# Patient Record
Sex: Male | Born: 1960 | Race: Black or African American | Hispanic: No | Marital: Married | Smoking: Former smoker
Health system: Southern US, Community
[De-identification: ages and names within clinical notes are randomized; demographics above are authoritative.]

## PROBLEM LIST (undated history)

## (undated) DIAGNOSIS — I219 Acute myocardial infarction, unspecified: Secondary | ICD-10-CM

## (undated) DIAGNOSIS — E119 Type 2 diabetes mellitus without complications: Secondary | ICD-10-CM

## (undated) DIAGNOSIS — J189 Pneumonia, unspecified organism: Secondary | ICD-10-CM

## (undated) DIAGNOSIS — K219 Gastro-esophageal reflux disease without esophagitis: Secondary | ICD-10-CM

## (undated) DIAGNOSIS — M1990 Unspecified osteoarthritis, unspecified site: Secondary | ICD-10-CM

## (undated) DIAGNOSIS — R51 Headache: Secondary | ICD-10-CM

## (undated) DIAGNOSIS — J449 Chronic obstructive pulmonary disease, unspecified: Secondary | ICD-10-CM

## (undated) HISTORY — PX: OTHER SURGICAL HISTORY: SHX169

## (undated) HISTORY — PX: TONSILLECTOMY: SUR1361

## (undated) HISTORY — DX: Type 2 diabetes mellitus without complications: E11.9

## (undated) HISTORY — PX: MULTIPLE TOOTH EXTRACTIONS: SHX2053

## (undated) HISTORY — PX: SPINE SURGERY: SHX786

---

## 2008-03-08 ENCOUNTER — Emergency Department (HOSPITAL_COMMUNITY): Admission: EM | Admit: 2008-03-08 | Discharge: 2008-03-08 | Payer: Self-pay | Admitting: Family Medicine

## 2008-05-13 ENCOUNTER — Emergency Department (HOSPITAL_COMMUNITY): Admission: EM | Admit: 2008-05-13 | Discharge: 2008-05-13 | Payer: Self-pay | Admitting: Emergency Medicine

## 2008-07-20 ENCOUNTER — Emergency Department (HOSPITAL_COMMUNITY): Admission: EM | Admit: 2008-07-20 | Discharge: 2008-07-20 | Payer: Self-pay | Admitting: Emergency Medicine

## 2008-09-16 ENCOUNTER — Emergency Department (HOSPITAL_COMMUNITY): Admission: EM | Admit: 2008-09-16 | Discharge: 2008-09-16 | Payer: Self-pay | Admitting: Emergency Medicine

## 2008-10-11 ENCOUNTER — Emergency Department (HOSPITAL_COMMUNITY): Admission: EM | Admit: 2008-10-11 | Discharge: 2008-10-12 | Payer: Self-pay | Admitting: Emergency Medicine

## 2008-12-01 ENCOUNTER — Emergency Department (HOSPITAL_COMMUNITY): Admission: EM | Admit: 2008-12-01 | Discharge: 2008-12-01 | Payer: Self-pay | Admitting: Emergency Medicine

## 2008-12-24 ENCOUNTER — Emergency Department (HOSPITAL_COMMUNITY): Admission: EM | Admit: 2008-12-24 | Discharge: 2008-12-24 | Payer: Self-pay | Admitting: Family Medicine

## 2009-01-23 ENCOUNTER — Emergency Department (HOSPITAL_COMMUNITY): Admission: EM | Admit: 2009-01-23 | Discharge: 2009-01-23 | Payer: Self-pay | Admitting: Emergency Medicine

## 2009-03-07 ENCOUNTER — Emergency Department (HOSPITAL_COMMUNITY): Admission: EM | Admit: 2009-03-07 | Discharge: 2009-03-07 | Payer: Self-pay | Admitting: Emergency Medicine

## 2009-04-09 ENCOUNTER — Emergency Department (HOSPITAL_COMMUNITY): Admission: EM | Admit: 2009-04-09 | Discharge: 2009-04-09 | Payer: Self-pay | Admitting: Emergency Medicine

## 2009-05-23 ENCOUNTER — Emergency Department (HOSPITAL_COMMUNITY): Admission: EM | Admit: 2009-05-23 | Discharge: 2009-05-23 | Payer: Self-pay | Admitting: Emergency Medicine

## 2009-06-08 ENCOUNTER — Ambulatory Visit: Payer: Self-pay | Admitting: Internal Medicine

## 2009-07-06 ENCOUNTER — Ambulatory Visit: Payer: Self-pay | Admitting: Internal Medicine

## 2009-07-14 ENCOUNTER — Ambulatory Visit (HOSPITAL_COMMUNITY): Admission: RE | Admit: 2009-07-14 | Discharge: 2009-07-14 | Payer: Self-pay | Admitting: Family Medicine

## 2009-08-17 ENCOUNTER — Ambulatory Visit: Payer: Self-pay | Admitting: Internal Medicine

## 2009-10-13 LAB — PULMONARY FUNCTION TEST

## 2010-05-09 ENCOUNTER — Emergency Department (HOSPITAL_COMMUNITY)
Admission: EM | Admit: 2010-05-09 | Discharge: 2010-05-09 | Disposition: A | Discharge status: Home / Self Care | Payer: Medicaid Other | Attending: Emergency Medicine | Admitting: Emergency Medicine

## 2010-05-09 DIAGNOSIS — M545 Low back pain, unspecified: Secondary | ICD-10-CM | POA: Insufficient documentation

## 2010-05-09 DIAGNOSIS — J449 Chronic obstructive pulmonary disease, unspecified: Secondary | ICD-10-CM | POA: Insufficient documentation

## 2010-05-09 DIAGNOSIS — J4489 Other specified chronic obstructive pulmonary disease: Secondary | ICD-10-CM | POA: Insufficient documentation

## 2010-05-10 LAB — GC/CHLAMYDIA PROBE AMP, GENITAL
Chlamydia, DNA Probe: NEGATIVE
GC Probe Amp, Genital: NEGATIVE

## 2010-06-23 ENCOUNTER — Ambulatory Visit (HOSPITAL_COMMUNITY)
Admission: RE | Admit: 2010-06-23 | Discharge: 2010-06-23 | Disposition: A | Discharge status: Home / Self Care | Payer: Medicaid Other | Source: Ambulatory Visit | Attending: Family Medicine | Admitting: Family Medicine

## 2010-06-23 ENCOUNTER — Other Ambulatory Visit (HOSPITAL_COMMUNITY): Payer: Self-pay | Admitting: Family Medicine

## 2010-06-23 DIAGNOSIS — M545 Low back pain, unspecified: Secondary | ICD-10-CM | POA: Insufficient documentation

## 2010-06-23 DIAGNOSIS — R52 Pain, unspecified: Secondary | ICD-10-CM

## 2010-06-23 DIAGNOSIS — Z9181 History of falling: Secondary | ICD-10-CM | POA: Insufficient documentation

## 2010-06-23 DIAGNOSIS — M51379 Other intervertebral disc degeneration, lumbosacral region without mention of lumbar back pain or lower extremity pain: Secondary | ICD-10-CM | POA: Insufficient documentation

## 2010-06-23 DIAGNOSIS — M5137 Other intervertebral disc degeneration, lumbosacral region: Secondary | ICD-10-CM | POA: Insufficient documentation

## 2010-06-23 DIAGNOSIS — M47814 Spondylosis without myelopathy or radiculopathy, thoracic region: Secondary | ICD-10-CM | POA: Insufficient documentation

## 2010-06-30 ENCOUNTER — Emergency Department (HOSPITAL_COMMUNITY)
Admission: EM | Admit: 2010-06-30 | Discharge: 2010-06-30 | Disposition: A | Discharge status: Home / Self Care | Payer: Medicaid Other | Attending: Emergency Medicine | Admitting: Emergency Medicine

## 2010-06-30 DIAGNOSIS — M549 Dorsalgia, unspecified: Secondary | ICD-10-CM | POA: Insufficient documentation

## 2010-06-30 DIAGNOSIS — J4489 Other specified chronic obstructive pulmonary disease: Secondary | ICD-10-CM | POA: Insufficient documentation

## 2010-06-30 DIAGNOSIS — J449 Chronic obstructive pulmonary disease, unspecified: Secondary | ICD-10-CM | POA: Insufficient documentation

## 2010-06-30 DIAGNOSIS — M545 Low back pain, unspecified: Secondary | ICD-10-CM | POA: Insufficient documentation

## 2010-06-30 DIAGNOSIS — G8929 Other chronic pain: Secondary | ICD-10-CM | POA: Insufficient documentation

## 2010-06-30 DIAGNOSIS — R062 Wheezing: Secondary | ICD-10-CM | POA: Insufficient documentation

## 2010-07-22 ENCOUNTER — Encounter: Payer: Medicaid Other | Attending: Neurosurgery | Admitting: Neurosurgery

## 2010-07-22 DIAGNOSIS — M5430 Sciatica, unspecified side: Secondary | ICD-10-CM

## 2010-07-22 DIAGNOSIS — M545 Low back pain, unspecified: Secondary | ICD-10-CM | POA: Insufficient documentation

## 2010-07-22 DIAGNOSIS — J449 Chronic obstructive pulmonary disease, unspecified: Secondary | ICD-10-CM | POA: Insufficient documentation

## 2010-07-22 DIAGNOSIS — R269 Unspecified abnormalities of gait and mobility: Secondary | ICD-10-CM | POA: Insufficient documentation

## 2010-07-22 DIAGNOSIS — J4489 Other specified chronic obstructive pulmonary disease: Secondary | ICD-10-CM | POA: Insufficient documentation

## 2010-07-23 NOTE — Progress Notes (Signed)
This is a patient sent to [place] by [facility] for chronic low back pain. He states he has had a fall in the past that has been over 4 years ago. He was treated in [place].  He moved down here 2 years ago and been treated by Dr. Audria Nine, now Dr. Suzie Portela at [facility].  He states he has been taking Percocet and that is only thing that helps.  He does have a recent prescription for Percocet from a dentist that has done some extractions for him.  He states his pain is worse.  He does not describe radicular pain, just low back pain.  The patient is extremely short of breath, his COPD is advanced.  He is on multiple medications for that.  He rates his pain at a 10, his general activity level is 10.  Pain is worse during the day, all activities aggravate it, medication is the only thing that helps. The patient is asking for Percocet by name.  Mobility, he walks without assistance.  He states he does have a cane and walker, although I do not see that today.  He does walk with a kyphotic limp.  He does not climb steps, he drives, he can only walk about 10 minutes.  He is unemployed.  REVIEW OF SYSTEMS:  As noted, difficulties above as well as night sweats, the shortness of breath, and wheezing, numbness, paresthesias, depression, anxiety.  No suicidal thoughts are noted.  PRIMARY CARE DOCTOR:  Dr. Suzie Portela, [facility].  PAST MEDICAL HISTORY:  Significant for the COPD.  SOCIAL HISTORY:  He is single, lives with a friend.  FAMILY HISTORY:  Significant for lung disease, diabetes, hypertension, and disability.  PHYSICAL EXAMINATION:  VITAL SIGNS:  His blood pressure 108/66, pulse 102, respirations 22, 93% on room air. GENERAL:  He is extremely short of breath. NEUROLOGIC:  His motor strength in the right lower extremity is 5/5 even though he gives away, again short of breath.  Left lower extremity he gives away in all resistance testing; iliopsoas, quadriceps, dorsiflexor, and plantar flexor  about 4/5 with exaggerated pain.  His reflexes are 1 in the upper extremities including the deltoid, biceps, and brachioradialis bilaterally.  He is 2 at the quadriceps, 1 at the gastrocs.  Again, he has a kyphotic limp.  His sensation is intact in the upper and lower extremities.  He will not flex or extend when I ask. He is point tender over the lumbar spine and every area tested.  He gives a history of having gabapentin, Valium, Flexeril injections and tramadol in the past.  His Oswestry score is 70.  His current medication list is omeprazole, various albuterol inhalers, Advair inhalers, tramadol, cyclobenzaprine, Combivent.  He states she has a problem with penicillin.  ASSESSMENT:  Chronic low back pain, unknown etiology.  PLAN:  He will not be prescribed anything today.  He is to undergo a UDS.  He will follow up with Dr. Wynn Banker in 1 month for recommendations.  He will continue his current medication regimen he has from my [facility] at this time.  His questions were encouraged and answered.    Holly Pring L. Blima Dessert Electronically Signed   RLW/MedQ D:  07/22/2010 14:24:19  T:  07/23/2010 04:12:54  Job #:  [id number]

## 2010-07-26 ENCOUNTER — Other Ambulatory Visit: Payer: Self-pay | Admitting: Physical Medicine & Rehabilitation

## 2010-07-26 ENCOUNTER — Other Ambulatory Visit: Payer: Self-pay | Admitting: Neurosurgery

## 2010-07-26 DIAGNOSIS — M545 Low back pain: Secondary | ICD-10-CM

## 2010-07-26 DIAGNOSIS — M79609 Pain in unspecified limb: Secondary | ICD-10-CM

## 2010-07-29 ENCOUNTER — Emergency Department (HOSPITAL_COMMUNITY)
Admission: EM | Admit: 2010-07-29 | Discharge: 2010-07-29 | Disposition: A | Discharge status: Home / Self Care | Payer: Medicaid Other | Attending: Emergency Medicine | Admitting: Emergency Medicine

## 2010-07-29 ENCOUNTER — Ambulatory Visit (HOSPITAL_COMMUNITY)
Admission: RE | Admit: 2010-07-29 | Discharge: 2010-07-29 | Disposition: A | Discharge status: Home / Self Care | Payer: Medicaid Other | Source: Ambulatory Visit | Attending: Physical Medicine & Rehabilitation | Admitting: Physical Medicine & Rehabilitation

## 2010-07-29 DIAGNOSIS — M545 Low back pain, unspecified: Secondary | ICD-10-CM | POA: Insufficient documentation

## 2010-07-29 DIAGNOSIS — R0602 Shortness of breath: Secondary | ICD-10-CM | POA: Insufficient documentation

## 2010-07-29 DIAGNOSIS — J449 Chronic obstructive pulmonary disease, unspecified: Secondary | ICD-10-CM | POA: Insufficient documentation

## 2010-07-29 DIAGNOSIS — M5137 Other intervertebral disc degeneration, lumbosacral region: Secondary | ICD-10-CM | POA: Insufficient documentation

## 2010-07-29 DIAGNOSIS — R29898 Other symptoms and signs involving the musculoskeletal system: Secondary | ICD-10-CM | POA: Insufficient documentation

## 2010-07-29 DIAGNOSIS — Z79899 Other long term (current) drug therapy: Secondary | ICD-10-CM | POA: Insufficient documentation

## 2010-07-29 DIAGNOSIS — M79609 Pain in unspecified limb: Secondary | ICD-10-CM

## 2010-07-29 DIAGNOSIS — M5126 Other intervertebral disc displacement, lumbar region: Secondary | ICD-10-CM | POA: Insufficient documentation

## 2010-07-29 DIAGNOSIS — M48061 Spinal stenosis, lumbar region without neurogenic claudication: Secondary | ICD-10-CM | POA: Insufficient documentation

## 2010-07-29 DIAGNOSIS — J4489 Other specified chronic obstructive pulmonary disease: Secondary | ICD-10-CM | POA: Insufficient documentation

## 2010-07-29 DIAGNOSIS — M51379 Other intervertebral disc degeneration, lumbosacral region without mention of lumbar back pain or lower extremity pain: Secondary | ICD-10-CM | POA: Insufficient documentation

## 2010-08-18 ENCOUNTER — Encounter: Payer: Medicaid Other | Attending: Neurosurgery | Admitting: Neurosurgery

## 2010-08-18 DIAGNOSIS — R269 Unspecified abnormalities of gait and mobility: Secondary | ICD-10-CM | POA: Insufficient documentation

## 2010-08-18 DIAGNOSIS — M5430 Sciatica, unspecified side: Secondary | ICD-10-CM

## 2010-08-18 DIAGNOSIS — M545 Low back pain, unspecified: Secondary | ICD-10-CM | POA: Insufficient documentation

## 2010-08-18 DIAGNOSIS — J449 Chronic obstructive pulmonary disease, unspecified: Secondary | ICD-10-CM | POA: Insufficient documentation

## 2010-08-18 DIAGNOSIS — J4489 Other specified chronic obstructive pulmonary disease: Secondary | ICD-10-CM | POA: Insufficient documentation

## 2010-08-18 NOTE — Assessment & Plan Note (Signed)
ACCOUNT:  [id number].  [name] was seen as a new patient last month.  He completed his UDS was came back consistent for tramadol only.  He comes in today stating he has had an MRI of his back as referred him over to Dr. Delma [name] at [facility].  We obtained an MRI of the lumbar spine which does show right paracentral disk extrusion with cranial extension at L1-2, extruded disk hits the L2 nerve roots, L3-4, L4-5 has disk desiccation and degeneration with circumferential disk bulging, has carotid lateral recesses and will follow up with Dr. Lovell [name] for possible surgical recommendations in the next couple weeks.  He still reports low back pain, he has trouble rising from the seated position, he averages his pain at 10 and a sharp stabbing pain with aching sensation.  His general activity level is 10.  Pain is worse during the day and night.  Sleep patterns are poor.  All activities aggravate. Medication tends to help.  He states he has visited the [facility] ED a couple of times to get pain medicine.  He walks with assistance.  He uses a cane.  He does not climb steps or drive.  He can only walk about 6-7 minutes at a time.  He is on disability.  REVIEW OF SYSTEMS:  Notable for difficulties described above as well as some paresthesias in lower extremities, spasms, anxiety, no suicidal thoughts or aberrant behaviors.  He has some respiratory infections, coughing, shortness of breath, sleep apnea, wheezing, poor appetite.  PAST MEDICAL HISTORY:  Unchanged.  SOCIAL HISTORY:  He is single.  Lives with a friend.  FAMILY HISTORY:  Unchanged.  He will follow up with Dr. Lovell [name] regarding his MRI.  PHYSICAL EXAMINATION:  His blood pressure 152/78, pulse 112, respirations 22, O2 sats 94 on room air.  His motor strength is still 5/5 in lower extremity.  Sensation is intact.  Constitutionally, he is within normal limits.  His affect is bright and alert.  ASSESSMENT: 1. Lumbar  spondylosis. 2. Degenerative disk disease and lumbar spine, MRI confirmed.  PLAN: 1. Follow up appointment with Dr. Delma [name] at [facility] for questionable surgical intervention. 2. We will start him on Percocet 5/325 one p.o. t.i.d. 90 with no     refill.  He knows to not take any more and have to bring his     bottles back with for follow up. 3. Ibuprofen 600 mg one p.o. t.i.d. 2 refills.  We will see him back     in the clinic in 1 month.     Scott [name] L. Scott [name] Electronically Signed    RLW/MedQ D:  08/18/2010 10:27:03  T:  08/18/2010 12:08:13  Job #:  [id number]

## 2010-09-15 ENCOUNTER — Ambulatory Visit: Payer: Medicaid Other | Admitting: Neurosurgery

## 2010-09-16 ENCOUNTER — Ambulatory Visit: Payer: Medicaid Other | Admitting: Physical Medicine & Rehabilitation

## 2010-09-19 ENCOUNTER — Ambulatory Visit (HOSPITAL_BASED_OUTPATIENT_CLINIC_OR_DEPARTMENT_OTHER): Payer: Medicaid Other | Admitting: Physical Medicine & Rehabilitation

## 2010-09-19 ENCOUNTER — Encounter: Payer: Medicaid Other | Attending: Physical Medicine & Rehabilitation

## 2010-09-19 DIAGNOSIS — M542 Cervicalgia: Secondary | ICD-10-CM

## 2010-09-19 DIAGNOSIS — M5137 Other intervertebral disc degeneration, lumbosacral region: Secondary | ICD-10-CM

## 2010-09-19 DIAGNOSIS — M549 Dorsalgia, unspecified: Secondary | ICD-10-CM | POA: Insufficient documentation

## 2010-09-19 DIAGNOSIS — G8929 Other chronic pain: Secondary | ICD-10-CM | POA: Insufficient documentation

## 2010-09-19 DIAGNOSIS — M51379 Other intervertebral disc degeneration, lumbosacral region without mention of lumbar back pain or lower extremity pain: Secondary | ICD-10-CM | POA: Insufficient documentation

## 2010-09-19 DIAGNOSIS — R209 Unspecified disturbances of skin sensation: Secondary | ICD-10-CM | POA: Insufficient documentation

## 2010-09-21 NOTE — Assessment & Plan Note (Signed)
CHIEF COMPLAINT:  Back pain, neck pain, tingling in the hands and feet.  A 50 year old male, who relocated from [place] 2 years ago with primary complaints of neck pain, back pain, as well as hand and foot numbness.  He has had these complaints for a number of years.  He has had an MRI of his lumbar spine ordered through this office in July 2012 showing a L1-L2 right paracentral disk extrusion with some cranial extension of disk material contacting the right L2 nerve root.  The patient feels that in fact his left lower extremity is a bit weaker than the right side.  He has been referred to neurosurgery and was told that he does not need surgery, but needed an MRI of his neck given the hand and foot numbness.  He has had no other new problems diagnosed in the interval time.  His urine drug screen was appropriate and he was started on Percocet 5/325 t.i.d.  He states his previous dose was at 10 mg t.i.d. or q.i.d. dose.  He has had no difficulty with the medication.  No constipation.  REVIEW OF SYSTEMS:  Positive for numbness, tingling, trouble walking, spasms, dizziness, depression, anxiety, coughing, wheezing, shortness of breath.  His other past medical history is significant for COPD.  He states that he has been told it is severe but does not use oxygen.  He is on prednisone and uses a nebulizer.  SOCIAL HISTORY:  Single, lives with a roommate.  FAMILY HISTORY:  Lung disease, hypertension, diabetes, disability.  PHYSICAL EXAMINATION:  VITAL SIGNS:  Blood pressure 121/71, pulse 74, respirations 21, O2 sat 96% on room air. GENERAL:  No acute stress. NEUROLOGIC:  Negative for straight leg raising test.  Deep tendon reflexes are normal at the ankles, hyperreflexic at the knee, biceps, triceps.  Does have a flexion mass response as well as a positive Hoffmann's in bilateral upper extremities.  His sensory exam reduced in the index fingers bilaterally, intact at the little finger.   His feet have decreased sensation bilaterally as well as at the knees bilaterally.  Gait shows no evidence toe drag or knee instability.  Motor strength is full in the upper extremities and lower extremity.  He has a 4- with some give-way weakness in hip flexors, knee extensors, ankle dorsiflexors, plantar flexors.  IMPRESSION: 1. Lumbar degenerative disk.  He has MRI evidence of contact with     right L2 nerve root.  However, his symptoms are more diffuse and     certainly nothing focal to suggest a L2 radiculopathy. 2. Hyperactive reflexes, neck pain, and paresthesias in all four     limbs.  MRI ordered by Neurosurgery to rule out myelopathy.  If     this is unremarkable, would proceed with EMG and CV. 3. Chronic pain.  We will increase his Percocet 7.5 t.i.d. and     initiate some gabapentin working up to 100 mg t.i.d.  Discussed     with the patient, agrees with plan, signed opioid consent form.     Erick Colace, M.D. Electronically Signed    AEK/MedQ D:  09/19/2010 11:35:20  T:  09/19/2010 14:45:15  Job #:  [id number]  cc:   Cristi Loron, M.D. Fax: [phone]

## 2010-10-18 ENCOUNTER — Ambulatory Visit (HOSPITAL_BASED_OUTPATIENT_CLINIC_OR_DEPARTMENT_OTHER): Payer: Medicaid Other | Admitting: Physical Medicine & Rehabilitation

## 2010-10-18 ENCOUNTER — Encounter: Payer: Medicaid Other | Attending: Physical Medicine & Rehabilitation

## 2010-10-18 DIAGNOSIS — M542 Cervicalgia: Secondary | ICD-10-CM | POA: Insufficient documentation

## 2010-10-18 DIAGNOSIS — M549 Dorsalgia, unspecified: Secondary | ICD-10-CM | POA: Insufficient documentation

## 2010-10-18 DIAGNOSIS — M48061 Spinal stenosis, lumbar region without neurogenic claudication: Secondary | ICD-10-CM

## 2010-10-18 DIAGNOSIS — M5137 Other intervertebral disc degeneration, lumbosacral region: Secondary | ICD-10-CM | POA: Insufficient documentation

## 2010-10-18 DIAGNOSIS — R209 Unspecified disturbances of skin sensation: Secondary | ICD-10-CM

## 2010-10-18 DIAGNOSIS — M51379 Other intervertebral disc degeneration, lumbosacral region without mention of lumbar back pain or lower extremity pain: Secondary | ICD-10-CM | POA: Insufficient documentation

## 2010-10-18 DIAGNOSIS — G8929 Other chronic pain: Secondary | ICD-10-CM | POA: Insufficient documentation

## 2010-10-18 NOTE — Assessment & Plan Note (Signed)
HISTORY:  Scott [name] is a 50 year old male with severe COPD.  He has been on disability for this.  He has had back pain and neck pain.  His lumbar MRI showed degenerative disk L3-4 and L4-5, but only mild stenosis at these levels.  He also has neck pain.  He has been referred to Neurosurgery.  They are doing MRI of his neck but this apparently has not been done yet.  In terms of his pain medication, he has been gradually increased from Percocet 5 mg to 7.5 and now states that this is still not helping a much.  We trialed him on Neurontin 100 mg t.i.d., states he had a weight gain with this, would like to get off of this given that he is already overweight.  He has had no other new medical problems.  He is not having any severe constipation, but does have some constipation.  REVIEW OF SYSTEMS:  Review of systems also positive for numbness, tingling in all four extremities, wheezing, coughing, respiratory infections related to COPD.  PRIMARY CARE PHYSICIAN:  [facility].  SOCIAL HISTORY:  Single.  PHYSICAL EXAMINATION:  VITAL SIGNS:  Blood pressure is 147/111, we will need to recheck and refer to primary care if it still elevated. GENERAL:  His gait shows no evidence toe drag or knee instability.  He has 3+ reflexes in bilateral upper and lower extremities.  His strength is normal in bilateral upper and lower extremities.  Range of motion is normal bilateral upper and lower extremities.  Mood and affect are appropriate.  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  He was encouraged to follow up with the ordering MD at [facility] to see if all the referral information has been sent and perhaps he may have missed the phone call to schedule it.  I will see him back in 4 weeks.  We will change him to oxycodone 10 mg t.i.d.  Discussed with the  patient, agrees with plan.     Erick Colace, M.D. Electronically Signed    AEK/MedQ D:  10/18/2010 11:07:02  T:  10/18/2010 11:28:38  Job #:  [id number]  cc:   Dala Dock

## 2010-10-19 LAB — PULMONARY FUNCTION TEST

## 2010-10-26 ENCOUNTER — Other Ambulatory Visit (HOSPITAL_COMMUNITY): Payer: Self-pay | Admitting: Neurosurgery

## 2010-10-26 DIAGNOSIS — M5410 Radiculopathy, site unspecified: Secondary | ICD-10-CM

## 2010-10-26 DIAGNOSIS — M542 Cervicalgia: Secondary | ICD-10-CM

## 2010-11-01 ENCOUNTER — Ambulatory Visit (HOSPITAL_COMMUNITY)
Admission: RE | Admit: 2010-11-01 | Discharge: 2010-11-01 | Disposition: A | Discharge status: Home / Self Care | Payer: Medicaid Other | Source: Ambulatory Visit | Attending: Neurosurgery | Admitting: Neurosurgery

## 2010-11-01 DIAGNOSIS — M5000 Cervical disc disorder with myelopathy, unspecified cervical region: Secondary | ICD-10-CM | POA: Insufficient documentation

## 2010-11-01 DIAGNOSIS — R209 Unspecified disturbances of skin sensation: Secondary | ICD-10-CM | POA: Insufficient documentation

## 2010-11-01 DIAGNOSIS — M5410 Radiculopathy, site unspecified: Secondary | ICD-10-CM

## 2010-11-01 DIAGNOSIS — M542 Cervicalgia: Secondary | ICD-10-CM | POA: Insufficient documentation

## 2010-11-18 ENCOUNTER — Ambulatory Visit (HOSPITAL_BASED_OUTPATIENT_CLINIC_OR_DEPARTMENT_OTHER): Payer: Medicaid Other | Admitting: Physical Medicine & Rehabilitation

## 2010-11-18 ENCOUNTER — Encounter: Payer: Medicaid Other | Attending: Physical Medicine & Rehabilitation

## 2010-11-18 DIAGNOSIS — M4712 Other spondylosis with myelopathy, cervical region: Secondary | ICD-10-CM

## 2010-11-18 DIAGNOSIS — R209 Unspecified disturbances of skin sensation: Secondary | ICD-10-CM | POA: Insufficient documentation

## 2010-11-18 DIAGNOSIS — M549 Dorsalgia, unspecified: Secondary | ICD-10-CM | POA: Insufficient documentation

## 2010-11-18 DIAGNOSIS — M51379 Other intervertebral disc degeneration, lumbosacral region without mention of lumbar back pain or lower extremity pain: Secondary | ICD-10-CM | POA: Insufficient documentation

## 2010-11-18 DIAGNOSIS — G8929 Other chronic pain: Secondary | ICD-10-CM | POA: Insufficient documentation

## 2010-11-18 DIAGNOSIS — M542 Cervicalgia: Secondary | ICD-10-CM | POA: Insufficient documentation

## 2010-11-18 DIAGNOSIS — M5137 Other intervertebral disc degeneration, lumbosacral region: Secondary | ICD-10-CM | POA: Insufficient documentation

## 2010-11-18 NOTE — Assessment & Plan Note (Signed)
HISTORY:  A 50 year old male with back pain and neck pain.  He also has severe COPD, he has not taken his medicines for couple of days, but has not ready at the pharmacy to pick up after work his appointment with me. He was last seen by me in September, he was having paresthesias, had not had his MRI yet, this was done on October 31, 2010.  It showed a broad- based protrusion at C3-4 with spinal cord compression, some cord signal changes.  There was a protrusion at C4-5 as well as C5-6 and at C6-7. Certainly the most severe level was C3-4, I looked at the report as well as the films.  The patient complains of numbness in his hands and feet.  He has had problems with jerking of his arms.  He took his oxycodone t.i.d. has been out for about a week now.  He like to go back to his Percocet 7.5, and see if we can go up to q.i.d. which I am okay with.  His primary care physician told not to take the Neurontin because there would "make him blow up" I told him that I think given his symptoms in his fingers in the feet.  This would be helpful and unlikely because any type of significant peripheral edema especially at low dose. His pain level is 9/10.  He can walk 4-5 minutes at a time.  He does not climb steps or drive.  He needs some help with household duties, shopping with some bathing activities  REVIEW OF SYSTEMS:  Positive for weakness, numbness, tingling, trouble walking, dizziness, anxiety.  SOCIAL HISTORY:  Single, lives with a friend, nonsmoker, nondrinker.  OBJECTIVE:  VITAL SIGNS:  Blood pressure 113/82, pulse 106, respirations 20, and O2 saturation 94% on room air. GENERAL:  No acute distress.  Mood and affect appropriate.  He has 3+ reflexes bilateral upper lower extremities.  His strength is intact, however, bilateral upper and lower extremities.  He has decreased neck range of motion.  He does have back pain in the lower lumbar area as well.  Gait shows no evidence toe drag  or knee instability.  No spasticity.  IMPRESSION:  Cervical myelopathy has been causing his upper and lower extremity symptoms.  He has an appointment with Neurosurgery in November 5 and I anticipate he would be undergoing surgery, assuming that he is stable from a COPD standpoint.  I wrote him out a prescription for Percocet 7.5/325 q.i.d. as well as Neurontin 100 mg q.i.d.  I will see him back in a month.  However, told to cancel if he has had surgery in November and he would get his postoperative pain medicine from Dr. Lovell Sheehan.  Discussed with the patient, agrees with plan.     Erick Colace, M.D. Electronically Signed    AEK/MedQ D:  11/18/2010 11:23:50  T:  11/18/2010 12:47:26  Job #:  [id number]  cc:   Cristi Loron, M.D. Fax: [phone]

## 2010-11-30 ENCOUNTER — Other Ambulatory Visit: Payer: Self-pay | Admitting: Neurosurgery

## 2010-12-07 ENCOUNTER — Encounter (HOSPITAL_COMMUNITY): Payer: Self-pay | Admitting: Pharmacy Technician

## 2010-12-09 ENCOUNTER — Other Ambulatory Visit: Payer: Self-pay | Admitting: Neurosurgery

## 2010-12-09 ENCOUNTER — Encounter (HOSPITAL_COMMUNITY)
Admission: RE | Admit: 2010-12-09 | Discharge: 2010-12-09 | Disposition: A | Discharge status: Home / Self Care | Payer: Medicaid Other | Source: Ambulatory Visit | Attending: Neurosurgery | Admitting: Neurosurgery

## 2010-12-09 ENCOUNTER — Encounter (HOSPITAL_COMMUNITY): Payer: Self-pay

## 2010-12-09 ENCOUNTER — Encounter (HOSPITAL_COMMUNITY)
Admission: RE | Admit: 2010-12-09 | Discharge: 2010-12-09 | Disposition: A | Discharge status: Home / Self Care | Payer: Medicaid Other | Source: Ambulatory Visit | Attending: Anesthesiology | Admitting: Anesthesiology

## 2010-12-09 HISTORY — DX: Unspecified osteoarthritis, unspecified site: M19.90

## 2010-12-09 HISTORY — DX: Headache: R51

## 2010-12-09 HISTORY — DX: Gastro-esophageal reflux disease without esophagitis: K21.9

## 2010-12-09 HISTORY — DX: Chronic obstructive pulmonary disease, unspecified: J44.9

## 2010-12-09 LAB — CBC
MCH: 29.9 pg (ref 26.0–34.0)
MCV: 86.6 fL (ref 78.0–100.0)
Platelets: 228 10*3/uL (ref 150–400)
RBC: 5.46 MIL/uL (ref 4.22–5.81)
RDW: 13.7 % (ref 11.5–15.5)
WBC: 8.1 10*3/uL (ref 4.0–10.5)

## 2010-12-09 LAB — SURGICAL PCR SCREEN: MRSA, PCR: NEGATIVE

## 2010-12-09 NOTE — Pre-Procedure Instructions (Addendum)
21 Scott [name]  12/09/2010   Your procedure is scheduled on:  12/22/2010  Report to [facility] at 5:30 AM.  Call this number if you have problems the morning of surgery: [phone]   Remember: Discontinue Aspirin, Coumadin, Plavix, Effient and herbal medications.   Do not eat food:After Midnight.  Do not drink clear liquids: 4 Hours before arrival  Take these medicines the morning of surgery with A SIP OF WATER: Oxycodone, Inhalers, Prilosec   Do not wear jewelry, make-up or nail polish.  Do not wear lotions, powders, or perfumes. You may wear deodorant.  Do not shave 48 hours prior to surgery.  Do not bring valuables to the hospital.  Contacts, dentures or bridgework may not be worn into surgery.  Leave suitcase in the car. After surgery it may be brought to your room.  For patients admitted to the hospital, checkout time is 11:00 AM the day of discharge.   Patients discharged the day of surgery will not be allowed to drive home.  Name and phone number of your driver: Talley Kreiser [phone]  Special Instructions: CHG Shower Use Special Wash: 1/2 bottle night before surgery and 1/2 bottle morning of surgery.   Please read over the following fact sheets that you were given: Pain Booklet, Coughing and Deep Breathing, MRSA Information and Surgical Site Infection Prevention

## 2010-12-12 NOTE — Progress Notes (Signed)
Darl Pikes, surgery scheduler@Dr . Lovell Sheehan office calls unit w/abx order: Give Vancomycin if allergic to PCN.  Ordered put in and Ancef order discontinued.//L. Kristalynn Coddington,RN

## 2010-12-12 NOTE — Progress Notes (Signed)
Pt allergic to PCN.  Dr. Lovell Sheehan ordered Ancef for abx.  Darl Pikes, surgery scheduler @ Dr.  Lovell Sheehan notified of re: situation.  Voice Mail left.//L. Nana Vastine,RN

## 2010-12-19 ENCOUNTER — Encounter: Payer: Medicaid Other | Attending: Physical Medicine & Rehabilitation

## 2010-12-19 ENCOUNTER — Ambulatory Visit (HOSPITAL_BASED_OUTPATIENT_CLINIC_OR_DEPARTMENT_OTHER): Payer: Medicaid Other | Admitting: Physical Medicine & Rehabilitation

## 2010-12-19 DIAGNOSIS — M4712 Other spondylosis with myelopathy, cervical region: Secondary | ICD-10-CM

## 2010-12-19 DIAGNOSIS — G894 Chronic pain syndrome: Secondary | ICD-10-CM

## 2010-12-19 DIAGNOSIS — R209 Unspecified disturbances of skin sensation: Secondary | ICD-10-CM | POA: Insufficient documentation

## 2010-12-19 DIAGNOSIS — M542 Cervicalgia: Secondary | ICD-10-CM | POA: Insufficient documentation

## 2010-12-19 DIAGNOSIS — M5137 Other intervertebral disc degeneration, lumbosacral region: Secondary | ICD-10-CM | POA: Insufficient documentation

## 2010-12-19 DIAGNOSIS — M51379 Other intervertebral disc degeneration, lumbosacral region without mention of lumbar back pain or lower extremity pain: Secondary | ICD-10-CM | POA: Insufficient documentation

## 2010-12-19 DIAGNOSIS — M549 Dorsalgia, unspecified: Secondary | ICD-10-CM | POA: Insufficient documentation

## 2010-12-19 DIAGNOSIS — G8929 Other chronic pain: Secondary | ICD-10-CM | POA: Insufficient documentation

## 2010-12-19 NOTE — Assessment & Plan Note (Signed)
This a patient of Dr. Wynn Banker pending back surgery with Dr. Lovell Sheehan, [facility].  His surgery is scheduled for today but he had to reschedule possibly in January due to the fact that he has got to go to [place] to be with his mother who is ill.  He rates his pain unchanged at 7 or 8.  It is a sharp, stabbing, tingling, aching pain. General activity level is 7 to 8.  Pain is worse during the day and night.  Sleep patterns are poor.  All activities aggravate.  Rest and medication helps.  He walks with and without assistance.  He does use a cane or walker.  He needs some help with transfers.  He is on disability.  He needs help with ADLs and household duties.  REVIEW OF SYSTEMS:  Notable for difficulties described above as well as some weight fluctuations, coughing,  COPD symptoms, paresthesias, trouble walking, dizziness, anxiety, no suicidal thoughts or aberrant behaviors.  His last pill count is correct.  PAST MEDICAL HISTORY/SOCIAL HISTORY/FAMILY HISTORY:  Unchanged.  PHYSICAL EXAMINATION:  VITAL SIGNS:  His blood pressure 113/85, pulse 90 respirations 18, O2 sats 95 on room air.  His motor strength, sensation intact constitutionally is within normal limits.  He is alert and oriented x3.  He has a slight limp to his gait.  IMPRESSION:  History of cervical myelopathy.  Again schedule for surgery with Dr. Tressie Stalker, as soon as he gets back into town, probably late December or early January.  PLAN:  Refill Percocet 7.5/325 one p.o. q.i.d. 120 with no refill, and he knows that he will be seeing Dr. Lovell Sheehan for his immediate postop pain control.  His questions were encouraged and answered.  We will see him back in 4-6 weeks.     Haseeb Fiallos L. Blima Dessert Electronically Signed    RLW/MedQ D:  12/19/2010 11:02:33  T:  12/19/2010 21:01:56  Job #:  [id number]

## 2010-12-21 MED ORDER — VANCOMYCIN HCL 1000 MG IV SOLR
1500.0000 mg | Freq: Once | INTRAVENOUS | Status: DC
  Filled 2010-12-21: qty 1500

## 2010-12-22 ENCOUNTER — Encounter (HOSPITAL_COMMUNITY): Payer: Self-pay | Admitting: Anesthesiology

## 2010-12-22 ENCOUNTER — Inpatient Hospital Stay (HOSPITAL_COMMUNITY): Payer: Medicaid Other

## 2010-12-22 ENCOUNTER — Encounter (HOSPITAL_COMMUNITY)
Admission: RE | Disposition: A | Discharge status: Home / Self Care | Payer: Self-pay | Source: Ambulatory Visit | Attending: Neurosurgery

## 2010-12-22 ENCOUNTER — Inpatient Hospital Stay (HOSPITAL_COMMUNITY): Payer: Medicaid Other | Admitting: Anesthesiology

## 2010-12-22 ENCOUNTER — Inpatient Hospital Stay (HOSPITAL_COMMUNITY)
Admission: RE | Admit: 2010-12-22 | Discharge: 2010-12-24 | DRG: 473 | Disposition: A | Discharge status: Home / Self Care | Payer: Medicaid Other | Source: Ambulatory Visit | Attending: Neurosurgery | Admitting: Neurosurgery

## 2010-12-22 DIAGNOSIS — Z880 Allergy status to penicillin: Secondary | ICD-10-CM

## 2010-12-22 DIAGNOSIS — F17200 Nicotine dependence, unspecified, uncomplicated: Secondary | ICD-10-CM | POA: Diagnosis present

## 2010-12-22 DIAGNOSIS — J4489 Other specified chronic obstructive pulmonary disease: Secondary | ICD-10-CM | POA: Diagnosis present

## 2010-12-22 DIAGNOSIS — J449 Chronic obstructive pulmonary disease, unspecified: Secondary | ICD-10-CM | POA: Diagnosis present

## 2010-12-22 DIAGNOSIS — M4712 Other spondylosis with myelopathy, cervical region: Principal | ICD-10-CM | POA: Diagnosis present

## 2010-12-22 DIAGNOSIS — Z79899 Other long term (current) drug therapy: Secondary | ICD-10-CM

## 2010-12-22 DIAGNOSIS — K219 Gastro-esophageal reflux disease without esophagitis: Secondary | ICD-10-CM | POA: Diagnosis present

## 2010-12-22 HISTORY — PX: ANTERIOR CERVICAL DECOMP/DISCECTOMY FUSION: SHX1161

## 2010-12-22 SURGERY — ANTERIOR CERVICAL DECOMPRESSION/DISCECTOMY FUSION 3 LEVELS
Anesthesia: General | Site: Neck | Wound class: Clean

## 2010-12-22 MED ORDER — MIDAZOLAM HCL 5 MG/5ML IJ SOLN
INTRAMUSCULAR | Status: DC | PRN
  Administered 2010-12-22: 2 mg via INTRAVENOUS

## 2010-12-22 MED ORDER — LACTATED RINGERS IV SOLN
INTRAVENOUS | Status: DC | PRN
  Administered 2010-12-22 (×4): via INTRAVENOUS

## 2010-12-22 MED ORDER — NICOTINE 7 MG/24HR TD PT24
7.0000 mg | MEDICATED_PATCH | TRANSDERMAL | Status: DC
  Administered 2010-12-22 – 2010-12-23 (×3): 7 mg via TRANSDERMAL
  Filled 2010-12-22 (×3): qty 1

## 2010-12-22 MED ORDER — ALBUTEROL SULFATE HFA 108 (90 BASE) MCG/ACT IN AERS
INHALATION_SPRAY | RESPIRATORY_TRACT | Status: DC | PRN
  Administered 2010-12-22: 2 via RESPIRATORY_TRACT

## 2010-12-22 MED ORDER — ONDANSETRON HCL 4 MG/2ML IJ SOLN
INTRAMUSCULAR | Status: DC | PRN
  Administered 2010-12-22: 4 mg via INTRAVENOUS

## 2010-12-22 MED ORDER — HETASTARCH-ELECTROLYTES 6 % IV SOLN
INTRAVENOUS | Status: DC | PRN
  Administered 2010-12-22: 10:00:00 via INTRAVENOUS

## 2010-12-22 MED ORDER — HYDROCODONE-ACETAMINOPHEN 10-325 MG PO TABS
1.0000 | ORAL_TABLET | ORAL | Status: DC | PRN
  Filled 2010-12-22: qty 1

## 2010-12-22 MED ORDER — DIAZEPAM 5 MG PO TABS
5.0000 mg | ORAL_TABLET | Freq: Four times a day (QID) | ORAL | Status: DC | PRN
  Administered 2010-12-22 – 2010-12-24 (×5): 5 mg via ORAL
  Filled 2010-12-22 (×5): qty 1

## 2010-12-22 MED ORDER — ACETAMINOPHEN 650 MG RE SUPP: 650.0000 mg | RECTAL | Status: DC | PRN

## 2010-12-22 MED ORDER — GLYCOPYRROLATE 0.2 MG/ML IJ SOLN
INTRAMUSCULAR | Status: DC | PRN
  Administered 2010-12-22: .6 mg via INTRAVENOUS

## 2010-12-22 MED ORDER — SODIUM CHLORIDE 0.9 % IR SOLN
Status: DC | PRN
  Administered 2010-12-22: 1000 mL

## 2010-12-22 MED ORDER — SODIUM CHLORIDE 0.9 % IJ SOLN: 3.0000 mL | INTRAMUSCULAR | Status: DC | PRN

## 2010-12-22 MED ORDER — IPRATROPIUM-ALBUTEROL 18-103 MCG/ACT IN AERO
2.0000 | INHALATION_SPRAY | Freq: Four times a day (QID) | RESPIRATORY_TRACT | Status: DC
  Administered 2010-12-22 – 2010-12-23 (×7): 2 via RESPIRATORY_TRACT
  Filled 2010-12-22: qty 14.7

## 2010-12-22 MED ORDER — THROMBIN 20000 UNITS EX KIT: PACK | OROMUCOSAL | Status: DC | PRN

## 2010-12-22 MED ORDER — MORPHINE SULFATE 4 MG/ML IJ SOLN
1.0000 mg | INTRAMUSCULAR | Status: DC | PRN
  Administered 2010-12-22: 4 mg via INTRAVENOUS
  Administered 2010-12-22: 2 mg via INTRAVENOUS
  Administered 2010-12-23 (×4): 4 mg via INTRAVENOUS
  Filled 2010-12-22 (×6): qty 1

## 2010-12-22 MED ORDER — SODIUM CHLORIDE 0.9 % IV SOLN
INTRAVENOUS | Status: DC | PRN
  Administered 2010-12-22: 08:00:00 via INTRAVENOUS

## 2010-12-22 MED ORDER — ONDANSETRON HCL 4 MG/2ML IJ SOLN: 4.0000 mg | Freq: Four times a day (QID) | INTRAMUSCULAR | Status: DC | PRN

## 2010-12-22 MED ORDER — VANCOMYCIN HCL 1000 MG IV SOLR
1000.0000 mg | INTRAVENOUS | Status: DC | PRN
  Administered 2010-12-22: 1.5 g via INTRAVENOUS

## 2010-12-22 MED ORDER — FLUTICASONE-SALMETEROL 250-50 MCG/DOSE IN AEPB
1.0000 | INHALATION_SPRAY | Freq: Two times a day (BID) | RESPIRATORY_TRACT | Status: DC
  Administered 2010-12-22 – 2010-12-23 (×4): 1 via RESPIRATORY_TRACT
  Filled 2010-12-22: qty 14

## 2010-12-22 MED ORDER — PHENYLEPHRINE HCL 10 MG/ML IJ SOLN
INTRAMUSCULAR | Status: DC | PRN
  Administered 2010-12-22 (×3): 80 ug via INTRAVENOUS

## 2010-12-22 MED ORDER — OXYCODONE-ACETAMINOPHEN 5-325 MG PO TABS
1.0000 | ORAL_TABLET | ORAL | Status: DC | PRN
  Administered 2010-12-22 – 2010-12-24 (×9): 2 via ORAL
  Filled 2010-12-22 (×8): qty 2

## 2010-12-22 MED ORDER — NEOSTIGMINE METHYLSULFATE 1 MG/ML IJ SOLN
INTRAMUSCULAR | Status: DC | PRN
  Administered 2010-12-22: 4 mg via INTRAVENOUS

## 2010-12-22 MED ORDER — LACTATED RINGERS IV SOLN
INTRAVENOUS | Status: DC
Start: 2010-12-22 — End: 2010-12-24
  Administered 2010-12-22 – 2010-12-23 (×2): via INTRAVENOUS

## 2010-12-22 MED ORDER — IPRATROPIUM-ALBUTEROL 0.5-2.5 (3) MG/3ML IN SOLN: 3.0000 mL | Freq: Three times a day (TID) | RESPIRATORY_TRACT | Status: DC

## 2010-12-22 MED ORDER — SODIUM CHLORIDE 0.9 % IJ SOLN
3.0000 mL | Freq: Two times a day (BID) | INTRAMUSCULAR | Status: DC
Start: 2010-12-22 — End: 2010-12-24
  Administered 2010-12-23 (×2): 3 mL via INTRAVENOUS

## 2010-12-22 MED ORDER — ROCURONIUM BROMIDE 100 MG/10ML IV SOLN
INTRAVENOUS | Status: DC | PRN
  Administered 2010-12-22 (×2): 10 mg via INTRAVENOUS
  Administered 2010-12-22: 50 mg via INTRAVENOUS
  Administered 2010-12-22 (×4): 10 mg via INTRAVENOUS

## 2010-12-22 MED ORDER — FENTANYL CITRATE 0.05 MG/ML IJ SOLN
INTRAMUSCULAR | Status: DC | PRN
  Administered 2010-12-22 (×2): 50 ug via INTRAVENOUS
  Administered 2010-12-22: 100 ug via INTRAVENOUS
  Administered 2010-12-22 (×6): 50 ug via INTRAVENOUS

## 2010-12-22 MED ORDER — PROPOFOL 10 MG/ML IV EMUL
INTRAVENOUS | Status: DC | PRN
  Administered 2010-12-22: 200 mg via INTRAVENOUS

## 2010-12-22 MED ORDER — DOCUSATE SODIUM 100 MG PO CAPS
100.0000 mg | ORAL_CAPSULE | Freq: Two times a day (BID) | ORAL | Status: DC
  Administered 2010-12-22 – 2010-12-23 (×3): 100 mg via ORAL
  Filled 2010-12-22 (×3): qty 1

## 2010-12-22 MED ORDER — SODIUM CHLORIDE 0.9 % IV SOLN: 250.0000 mL | INTRAVENOUS | Status: DC

## 2010-12-22 MED ORDER — SODIUM CHLORIDE 0.9 % IR SOLN
Status: DC | PRN
  Administered 2010-12-22: 08:00:00

## 2010-12-22 MED ORDER — MENTHOL 3 MG MT LOZG: 1.0000 | LOZENGE | OROMUCOSAL | Status: DC | PRN

## 2010-12-22 MED ORDER — VANCOMYCIN HCL IN DEXTROSE 1-5 GM/200ML-% IV SOLN
1000.0000 mg | Freq: Two times a day (BID) | INTRAVENOUS | Status: DC
  Administered 2010-12-22 – 2010-12-23 (×3): 1000 mg via INTRAVENOUS
  Filled 2010-12-22 (×5): qty 200

## 2010-12-22 MED ORDER — BUPIVACAINE-EPINEPHRINE 0.5% -1:200000 IJ SOLN
INTRAMUSCULAR | Status: DC | PRN
  Administered 2010-12-22: 10 mL

## 2010-12-22 MED ORDER — ACETAMINOPHEN 325 MG PO TABS: 650.0000 mg | ORAL_TABLET | ORAL | Status: DC | PRN

## 2010-12-22 MED ORDER — ONDANSETRON HCL 4 MG/2ML IJ SOLN
4.0000 mg | INTRAMUSCULAR | Status: DC | PRN
  Administered 2010-12-22: 4 mg via INTRAVENOUS
  Filled 2010-12-22: qty 2

## 2010-12-22 MED ORDER — THROMBIN 20000 UNITS EX KIT
PACK | CUTANEOUS | Status: DC | PRN
  Administered 2010-12-22: 09:00:00 via TOPICAL

## 2010-12-22 MED ORDER — PANTOPRAZOLE SODIUM 40 MG PO TBEC
40.0000 mg | DELAYED_RELEASE_TABLET | Freq: Every day | ORAL | Status: DC
  Administered 2010-12-22 – 2010-12-23 (×2): 40 mg via ORAL
  Filled 2010-12-22 (×2): qty 1

## 2010-12-22 MED ORDER — HYDROMORPHONE HCL PF 1 MG/ML IJ SOLN
0.2500 mg | INTRAMUSCULAR | Status: DC | PRN
  Administered 2010-12-22 (×2): 0.5 mg via INTRAVENOUS

## 2010-12-22 MED ORDER — ZOLPIDEM TARTRATE 5 MG PO TABS: 10.0000 mg | ORAL_TABLET | Freq: Every evening | ORAL | Status: DC | PRN

## 2010-12-22 MED ORDER — DEXAMETHASONE SODIUM PHOSPHATE 4 MG/ML IJ SOLN
INTRAMUSCULAR | Status: DC | PRN
  Administered 2010-12-22: 4 mg via INTRAVENOUS

## 2010-12-22 MED ORDER — PHENOL 1.4 % MT LIQD: 1.0000 | OROMUCOSAL | Status: DC | PRN

## 2010-12-22 SURGICAL SUPPLY — 66 items
BAG DECANTER FOR FLEXI CONT (MISCELLANEOUS) ×2 IMPLANT
BENZOIN TINCTURE PRP APPL 2/3 (GAUZE/BANDAGES/DRESSINGS) ×2 IMPLANT
BIT DRILL SPINE QC 14 (BIT) ×2 IMPLANT
BLADE SURG 15 STRL LF DISP TIS (BLADE) ×3 IMPLANT
BLADE SURG 15 STRL SS (BLADE) ×3
BLADE ULTRA TIP 2M (BLADE) ×2 IMPLANT
BRUSH SCRUB EZ PLAIN DRY (MISCELLANEOUS) ×2 IMPLANT
BUR BARREL STRAIGHT FLUTE 4.0 (BURR) ×2 IMPLANT
BUR MATCHSTICK NEURO 3.0 LAGG (BURR) ×2 IMPLANT
CANISTER SUCTION 2500CC (MISCELLANEOUS) ×2 IMPLANT
CLOSURE STERI STRIP 1/2 X4 (GAUZE/BANDAGES/DRESSINGS) ×2 IMPLANT
CLOTH BEACON ORANGE TIMEOUT ST (SAFETY) ×2 IMPLANT
CONT SPEC 4OZ CLIKSEAL STRL BL (MISCELLANEOUS) ×2 IMPLANT
COVER MAYO STAND STRL (DRAPES) ×2 IMPLANT
DRAIN JACKSON PRATT 10MM FLAT (MISCELLANEOUS) ×2 IMPLANT
DRAPE LAPAROTOMY 100X72 PEDS (DRAPES) ×2 IMPLANT
DRAPE MICROSCOPE LEICA (MISCELLANEOUS) ×2 IMPLANT
DRAPE POUCH INSTRU U-SHP 10X18 (DRAPES) ×2 IMPLANT
DRAPE SURG 17X23 STRL (DRAPES) ×4 IMPLANT
ELECT REM PT RETURN 9FT ADLT (ELECTROSURGICAL) ×2
ELECTRODE REM PT RTRN 9FT ADLT (ELECTROSURGICAL) ×1 IMPLANT
EVACUATOR SILICONE 100CC (DRAIN) ×2 IMPLANT
GAUZE SPONGE 4X4 16PLY XRAY LF (GAUZE/BANDAGES/DRESSINGS) IMPLANT
GLOVE BIO SURGEON STRL SZ8.5 (GLOVE) ×2 IMPLANT
GLOVE BIOGEL PI IND STRL 8.5 (GLOVE) ×2 IMPLANT
GLOVE BIOGEL PI INDICATOR 8.5 (GLOVE) ×2
GLOVE ECLIPSE 6.5 STRL STRAW (GLOVE) ×4 IMPLANT
GLOVE ECLIPSE 7.5 STRL STRAW (GLOVE) ×4 IMPLANT
GLOVE EXAM NITRILE LRG STRL (GLOVE) IMPLANT
GLOVE EXAM NITRILE MD LF STRL (GLOVE) IMPLANT
GLOVE EXAM NITRILE XL STR (GLOVE) IMPLANT
GLOVE EXAM NITRILE XS STR PU (GLOVE) IMPLANT
GLOVE INDICATOR 7.5 STRL GRN (GLOVE) ×2 IMPLANT
GLOVE SS BIOGEL STRL SZ 8 (GLOVE) ×1 IMPLANT
GLOVE SUPERSENSE BIOGEL SZ 8 (GLOVE) ×1
GLOVE SURG SS PI 8.0 STRL IVOR (GLOVE) ×6 IMPLANT
GOWN BRE IMP SLV AUR LG STRL (GOWN DISPOSABLE) ×4 IMPLANT
GOWN BRE IMP SLV AUR XL STRL (GOWN DISPOSABLE) ×2 IMPLANT
GOWN STRL REIN 2XL LVL4 (GOWN DISPOSABLE) ×2 IMPLANT
KIT BASIN OR (CUSTOM PROCEDURE TRAY) ×2 IMPLANT
KIT ROOM TURNOVER OR (KITS) ×2 IMPLANT
MARKER SKIN DUAL TIP RULER LAB (MISCELLANEOUS) ×2 IMPLANT
NEEDLE HYPO 22GX1.5 SAFETY (NEEDLE) ×2 IMPLANT
NEEDLE SPNL 18GX3.5 QUINCKE PK (NEEDLE) ×4 IMPLANT
NS IRRIG 1000ML POUR BTL (IV SOLUTION) ×2 IMPLANT
PACK LAMINECTOMY NEURO (CUSTOM PROCEDURE TRAY) ×2 IMPLANT
PATTIES SURGICAL .5 X.5 (GAUZE/BANDAGES/DRESSINGS) ×2 IMPLANT
PATTIES SURGICAL 1X1 (DISPOSABLE) ×2 IMPLANT
PIN DISTRACTION 14MM (PIN) ×4 IMPLANT
PLATE ANT CERV XTEND 3 LV 51 (Plate) ×2 IMPLANT
PUTTY 10ML ACTIFUSE ABX (Putty) ×2 IMPLANT
RUBBERBAND STERILE (MISCELLANEOUS) IMPLANT
SCREW XTD VAR 4.2 SELF TAP (Screw) ×16 IMPLANT
SPONGE GAUZE 4X4 12PLY (GAUZE/BANDAGES/DRESSINGS) ×2 IMPLANT
SPONGE INTESTINAL PEANUT (DISPOSABLE) ×4 IMPLANT
SPONGE SURGIFOAM ABS GEL 100 (HEMOSTASIS) ×2 IMPLANT
STRIP CLOSURE SKIN 1/2X4 (GAUZE/BANDAGES/DRESSINGS) ×2 IMPLANT
SUT ETHILON 3 0 PS 1 (SUTURE) ×2 IMPLANT
SUT VIC AB 3-0 SH 8-18 (SUTURE) ×4 IMPLANT
SYR 20ML ECCENTRIC (SYRINGE) ×2 IMPLANT
TAPE CLOTH SURG 4X10 WHT LF (GAUZE/BANDAGES/DRESSINGS) ×2 IMPLANT
TOWEL OR 17X24 6PK STRL BLUE (TOWEL DISPOSABLE) ×2 IMPLANT
TOWEL OR 17X26 10 PK STRL BLUE (TOWEL DISPOSABLE) ×2 IMPLANT
TRAY FOLEY CATH 14FRSI W/METER (CATHETERS) ×2 IMPLANT
VISTA S 14X14X7 (Spacer) ×6 IMPLANT
WATER STERILE IRR 1000ML POUR (IV SOLUTION) ×2 IMPLANT

## 2010-12-22 NOTE — Op Note (Signed)
Brief history: The patient is a 50 year old black male who has suffered from neck pain with cervical myelopathy. He was worked up with a cervical MRI which demonstrated multilevel spondylosis and stenosis. There was also evidence of spinal cord signal change. I recommended surgery. Patient has weighed the risks benefits and alternatives surgery decided proceed with the operation.  Preoperative diagnosis: C3-4, C4-5 and C5-6 degeneration, spondylosis, stenosis, cervical myelopathy/radiculopathy, cervicalgia  Postoperative diagnosis: The same  Procedure: C3-4, C4-5 and C5-6 Anterior cervical discectomy/decompression; C3-4, C4-5 and C5-6 interbody arthrodesis with local morcellized autograft bone and Actifuse bone graft extender; insertion of interbody prosthesis at C3-4, C4-5 and C5-6 (Zimmer peek interbody prosthesis); anterior cervical plating from C3-C6 with globus titanium plate  Surgeon: Dr. Delma Officer  Asst.: Dr. Orbie Hurst  Anesthesia: Gen. endotracheal  Estimated blood loss: 150 cc  Drains: None  Complications: None  Description of procedure: The patient was brought to the operating room by the anesthesia team. General endotracheal anesthesia was induced. A roll was placed under the patient's shoulders to keep the neck in the neutral position. The patient's anterior cervical region was then prepared with Betadine scrub and Betadine solution. Sterile drapes were applied.  The area to be incised was then injected with Marcaine with epinephrine solution. I then used a scalpel to make a transverse incision in the patient's left anterior neck. I used the Metzenbaum scissors to divide the platysmal muscle and then to dissect medial to the sternocleidomastoid muscle, jugular vein, and carotid artery. I carefully dissected down towards the anterior cervical spine identifying the esophagus and retracting it medially. Then using Kitner swabs to clear soft tissue from the anterior cervical spine.  We then inserted a bent spinal needle into the upper exposed intervertebral disc space. We then obtained intraoperative radiographs confirm our location.  I then used electrocautery to detach the medial border of the longus colli muscle bilaterally from the C3-4, C4-5 and C5-6 intervertebral disc spaces. I then inserted the Caspar self-retaining retractor underneath the longus colli muscle bilaterally to provide exposure.  We then incised the intervertebral disc at C3-4. We then performed a partial intervertebral discectomy with a pituitary forceps and the Karlin curettes. I then inserted distraction screws into the vertebral bodies at C3-4. We then distracted the interspace. We then used the high-speed drill to decorticate the vertebral endplates at C3 and C4, to drill away the remainder of the intervertebral disc, to drill away some posterior spondylosis, and to thin out the posterior longitudinal ligament. I then incised ligament with the arachnoid knife. We then removed the ligament with a Kerrison punches undercutting the vertebral endplates and decompressing the thecal sac. We then performed foraminotomies about the bilateral C4 nerve roots. This completed the decompression at this level.  I then used procedure in an analogous fashion at C4-5 and C5-6 decompressing the thecal sac and the bile C5 and C6 nerve roots.  We now turned our to attention to the interbody fusion. We used the trial spacers to determine the appropriate size for the interbody prosthesis. We then pre-filled prosthesis with a combination of local morcellized autograft bone that we obtained during decompression as well as Actifuse bone graft extender. We then inserted the prosthesis into the distracted interspace at C3-4, C4-5 and C5-6. We then removed the distraction screws. There was a good snug fit of the prosthesis in the interspace.    Having completed the fusion we now turned attention to the anterior spinal  instrumentation. We used the high-speed drill  to drill away some anterior spondylosis at the disc spaces so that the plate lay down flat. We selected the appropriate length titanium anterior cervical plate. We laid it along the anterior aspect of the vertebral bodies from C3-C6. We then drilled 14 mm holes at C3, C4, C5 and C6. We then secured the plate to the vertebral bodies by placing two 14 mm self-tapping screws at C3, C4, C5 and C6. We then obtained intraoperative radiograph. The demonstrating good position of the instrumentation. We therefore secured the screws the plate the locking each cam. This completed the instrumentation.  We then obtained hemostasis using bipolar electrocautery. We irrigated the wound out with bacitracin solution. We then removed the retractor. We inspected the esophagus for any damage. There was none apparent. I then placed a 10 mm flat Jackson-Pratt drain in the prevertebral space. A tunneled out through separate stab wound and secured at the exit site with 3-0 nylon suture. We then reapproximated patient's platysmal muscle with interrupted 3-0 Vicryl suture. We then reapproximated the subcutaneous tissue with interrupted 3-0 Vicryl suture. The skin was reapproximated with Steri-Strips and benzoin. The wound was then covered with bacitracin ointment. A sterile dressing was applied. The drapes were removed. Patient was subsequently extubated by the anesthesia team and transported to the post anesthesia care unit in stable condition. All sponge instrument and needle counts were correct at the end of this case.

## 2010-12-22 NOTE — Anesthesia Procedure Notes (Signed)
Procedure Name: Intubation Date/Time: 12/22/2010 7:46 AM Performed by: Elon Alas Pre-anesthesia Checklist: Patient identified, Timeout performed, Emergency Drugs available, Suction available and Patient being monitored Patient Re-evaluated:Patient Re-evaluated prior to inductionOxygen Delivery Method: Circle System Utilized Preoxygenation: Pre-oxygenation with 100% oxygen Intubation Type: IV induction Ventilation: Mask ventilation without difficulty and Oral airway inserted - appropriate to patient size Grade View: Grade I Tube type: Oral Tube size: 7.5 mm Number of attempts: 1 Airway Equipment and Method: stylet and video-laryngoscopy Placement Confirmation: ETT inserted through vocal cords under direct vision,  breath sounds checked- equal and bilateral and positive ETCO2 Secured at: 23 cm Tube secured with: Tape Dental Injury: Teeth and Oropharynx as per pre-operative assessment  Comments: Head and neck maintained in neutral position

## 2010-12-22 NOTE — Transfer of Care (Signed)
Immediate Anesthesia Transfer of Care Note  Patient: Scott [name]  Procedure(s) Performed:  ANTERIOR CERVICAL DECOMPRESSION/DISCECTOMY FUSION 3 LEVELS - Anterior cervical discectomy with fusion cervical three-four, four-five, and five sixCDF with Interbody Prosthesis, plating, and Bone Graft   Patient Location: PACU  Anesthesia Type: General  Level of Consciousness: awake and alert   Airway & Oxygen Therapy: Patient Spontanous Breathing and Patient connected to face mask  Post-op Assessment: Report given to PACU RN, Post -op Vital signs reviewed and stable and Patient moving all extremities X 4  Post vital signs: Reviewed and stable  Complications: No apparent anesthesia complications

## 2010-12-22 NOTE — Progress Notes (Signed)
Subjective:  The patient is mildly somnolent. He is easily arousable. He looks well and has no complaints.  Objective: Vital signs in last 24 hours: Temp:  [98.1 F (36.7 C)-99.2 F (37.3 C)] 99.2 F (37.3 C) (11/29 1145) Pulse Rate:  [96] 96  (11/29 0616) Resp:  [16] 16  (11/29 0614) BP: (127)/(79) 127/79 mmHg (11/29 0616) SpO2:  [97 %] 97 % (11/29 0616)  Intake/Output from previous day:   Intake/Output this shift: Total I/O In: 3750 [I.V.:3250; IV Piggyback:500] Out: 370 [Urine:370]  Physical exam the patient is somnolent but easily arousable. Glasgow Coma Scale 13. He is moving all 4 extremities well. His dressing is clean and dry without evidence of hematoma or shift.  Lab Results: No results found for this basename: WBC:2,HGB:2,HCT:2,PLT:2 in the last 72 hours BMET No results found for this basename: NA:2,K:2,CL:2,CO2:2,GLUCOSE:2,BUN:2,CREATININE:2,CALCIUM:2 in the last 72 hours  Studies/Results: Dg Cervical Spine 2-3 Views  12/22/2010  *RADIOLOGY REPORT*  Clinical Data: C3-C6 ACDF.  CERVICAL SPINE - 2-3 VIEW  Comparison: MRI 11/01/2010  Findings: First intraoperative image demonstrates anterior localizing instrument at C3-4.  Second lateral intraoperative image demonstrates changes of anterior fusion from C3-C6.  Normal alignment.  No hardware or bony complicating feature.  Gauze material noted in the prevertebral soft tissues.  IMPRESSION: ACDF C3-C6.  Original Report Authenticated By: Cyndie Chime, M.D.    Assessment/Plan: Patient is doing well.  LOS: 0 days     Cambryn Charters D 12/22/2010, 12:34 PM

## 2010-12-22 NOTE — Anesthesia Preprocedure Evaluation (Addendum)
Anesthesia Evaluation  Patient identified by MRN, date of birth, ID band Patient awake    Reviewed: Allergy & Precautions, H&P , NPO status , Patient's Chart, lab work & pertinent test results  Airway Mallampati: II  Neck ROM: full    Dental   Pulmonary COPD         Cardiovascular     Neuro/Psych  Headaches,    GI/Hepatic GERD-  ,  Endo/Other    Renal/GU      Musculoskeletal   Abdominal   Peds  Hematology   Anesthesia Other Findings   Reproductive/Obstetrics                           Anesthesia Physical Anesthesia Plan  ASA: II  Anesthesia Plan: General   Post-op Pain Management:    Induction: Intravenous  Airway Management Planned: Oral ETT  Additional Equipment:   Intra-op Plan:   Post-operative Plan:   Informed Consent: I have reviewed the patients History and Physical, chart, labs and discussed the procedure including the risks, benefits and alternatives for the proposed anesthesia with the patient or authorized representative who has indicated his/her understanding and acceptance.     Plan Discussed with: CRNA and Surgeon  Anesthesia Plan Comments:         Anesthesia Quick Evaluation

## 2010-12-22 NOTE — Preoperative (Signed)
Beta Blockers   Reason not to administer Beta Blockers:Not Applicable 

## 2010-12-22 NOTE — Plan of Care (Signed)
Problem: Consults Goal: Diagnosis - Spinal Surgery Outcome: Completed/Met Date Met:  12/22/10 Cervical Spine Fusion

## 2010-12-22 NOTE — Anesthesia Postprocedure Evaluation (Signed)
Anesthesia Post Note  Patient: Scott [name]  Procedure(s) Performed:  ANTERIOR CERVICAL DECOMPRESSION/DISCECTOMY FUSION 3 LEVELS - Anterior cervical discectomy with fusion cervical three-four, four-five, and five sixCDF with Interbody Prosthesis, plating, and Bone Graft   Anesthesia type: General  Patient location: PACU  Post pain: Pain level controlled and Adequate analgesia  Post assessment: Post-op Vital signs reviewed, Patient's Cardiovascular Status Stable, Respiratory Function Stable, Patent Airway and Pain level controlled  Last Vitals:  Filed Vitals:   12/22/10 1145  BP:   Pulse:   Temp: 37.3 C  Resp:     Post vital signs: Reviewed and stable  Level of consciousness: awake, alert  and oriented  Complications: No apparent anesthesia complications

## 2010-12-22 NOTE — H&P (Signed)
Subjective:  The patient is a 50 year old black male who complains of neck pain with numbness and tingling in his hands. His workup with a cervical MRI which demonstrated severe stenosis at C3-4, C4-5, and C5-6. I discussed the various treatment options with him and recommend he undergo surgery. The patient has weighed the risks benefits and alternatives surgery decided proceed with the operation.  Past Medical History  Diagnosis Date  . COPD (chronic obstructive pulmonary disease)   . GERD (gastroesophageal reflux disease)   . Headache   . 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 Tonsillectomy   . Other surgical history     surgery on cheekbone and head for fall 2000  . 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 Multiple tooth extractions     Allergies  Allergen Reactions  . Penicillins Anaphylaxis    History  Substance Use Topics  . Smoking status: Current Everyday Smoker -- 0.2 packs/day for 37 years  . Smokeless tobacco: Not on file  . Alcohol Use:     No family history on file. Prior to Admission medications   Medication Sig Start Date End Date Taking? Authorizing Provider  albuterol-ipratropium (COMBIVENT) 18-103 MCG/ACT inhaler Inhale 2 puffs into the lungs 4 (four) times daily.     Yes Historical Provider, MD  Fluticasone-Salmeterol (ADVAIR) 250-50 MCG/DOSE AEPB Inhale 1 puff into the lungs every 12 (twelve) hours.     Yes Historical Provider, MD  ipratropium-albuterol (DUONEB) 0.5-2.5 (3) MG/3ML SOLN Take 3 mLs by nebulization 3 (three) times daily.     Yes Historical Provider, MD  nicotine (NICODERM CQ - DOSED IN MG/24 HR) 7 mg/24hr patch Place 1 patch onto the skin daily.     Yes Historical Provider, MD  omeprazole (PRILOSEC) 40 MG capsule Take 40 mg by mouth daily.     Yes Historical Provider, MD    oxyCODONE-acetaminophen (PERCOCET) 7.5-325 MG per tablet Take 1 tablet by mouth 4 (four) times daily.     Yes Historical Provider, MD     Review of Systems  Positive ROS: Negative except as above  All other systems have been reviewed and were otherwise negative with the exception of those mentioned in the HPI and as above.  Objective: Vital signs in last 24 hours: Temp:  [98.1 F (36.7 C)] 98.1 F (36.7 C) (11/29 0615) Pulse Rate:  [96] 96  (11/29 0616) Resp:  [16] 16  (11/29 0614) BP: (127)/(79) 127/79 mmHg (11/29 0616) SpO2:  [97 %] 97 % (11/29 0616)  General Appearance: Alert, cooperative, no distress, appears stated age Head: Normocephalic, without obvious abnormality, atraumatic Eyes: PERRL, conjunctiva/corneas clear, EOM's intact, fundi benign, both eyes      Ears: Normal TM's and external ear canals, both ears Throat: Lips, mucosa, and tongue normal; teeth and gums normal Neck: Supple, symmetrical, trachea midline, no adenopathy; thyroid: No enlargement/tenderness/nodules; no carotid bruit or JVD Back: Symmetric, no curvature, ROM normal, no CVA tenderness Lungs: Clear to auscultation bilaterally, respirations unlabored Heart: Regular rate and rhythm, S1 and S2 normal, no murmur, rub or gallop Abdomen: Soft, non-tender, bowel sounds active all four quadrants, no masses, no organomegaly Extremities: Extremities normal, atraumatic, no cyanosis or edema Pulses: 2+ and symmetric all extremities Skin: Skin color, texture, turgor normal,  no rashes or lesions  NEUROLOGIC:   Mental status: alert and oriented, no aphasia, good attention span, Fund of knowledge/ memory ok Motor Exam - grossly normal except for diffuse giveaway weakness. Sensory Exam - grossly normal Reflexes: 2+/4 throughout Coordination - grossly normal Gait - the patient has a myelopathic gait Balance -poor l Cranial Nerves: I: smell Not tested  II: visual acuity  OS: Normal    OD: Normal   II: visual  fields Full to confrontation  II: pupils Equal, round, reactive to light  III,VII:  None  III,IV,VI: extraocular muscles  Full ROM  V: mastication Normal  V: facial light touch sensation  Normal  V,VII: corneal reflex  Present  VII: facial muscle function - upper  Normal  VII: facial muscle function - lower Normal  VIII: hearing Not tested  IX: soft palate elevation  Normal  IX,X: gag reflex Present  XI: trapezius strength  5/5  XI: sternocleidomastoid strength 5/5  XI: neck flexion strength  5/5  XII: tongue strength  Normal    Data Review Lab Results  Component Value Date   WBC 8.1 12/09/2010   HGB 16.3 12/09/2010   HCT 47.3 12/09/2010   MCV 86.6 12/09/2010   PLT 228 12/09/2010   No results found for this basename: NA, K, CL, CO2, BUN, creatinine, glucose   No results found for this basename: INR, PROTIME    Imaging studies: I have reviewed the patient's cervical MRI performed at [facility] on 11/01/2010. It demonstrates the patient has severe stenosis at C3-4 with evidence of spinal cord gliosis. He also has severe stenosis at C4-5 and C5-6.  Assessment/Plan: 35, C4-5 and C5-6 spondylosis, stenosis, cervical myelopathy/radiculopathy cervicalgia: I discussed the situation with the patient. We have discussed the various treatment options. I have recommended that he undergo surgery. I have described the surgical option of a C3-4, C4-5 and C5-6 anterior cervical discectomy, fusion and plating. I have discussed the risks, benefits, alternatives and likelihood of achieving our goals. I have answered all the patient's questions. He wants to proceed with surgery.   Sriya Kroeze D 12/22/2010 7:25 AM

## 2010-12-23 ENCOUNTER — Encounter (HOSPITAL_COMMUNITY): Payer: Self-pay | Admitting: Neurosurgery

## 2010-12-23 MED FILL — Sodium Chloride Irrigation Soln 0.9%: Qty: 500 | Status: AC

## 2010-12-23 NOTE — Progress Notes (Signed)
Physical Therapy Evaluation Patient Details Name: Scott [name] MRN: [id number] DOB: 12/14/1960 Today's Date: 12/23/2010  Problem List:  Patient Active Problem List  Diagnoses  . Cervical spondylosis with myelopathy    Past Medical History:  Past Medical History  Diagnosis Date  . COPD (chronic obstructive pulmonary disease)   . GERD (gastroesophageal reflux disease)   . Headache   . 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 Tonsillectomy   . Other surgical history     surgery on cheekbone and head for fall 2000  . 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 Multiple tooth extractions     PT Assessment/Plan/Recommendation PT Assessment Clinical Impression Statement: Pt is a 50 y/o male admitted for cervical fusion.  Pt is at modified independent level with mobility with the exception of requiring minimal assistance with stairs, which fiancee can provide.  Pt with no further acute or d/c PT needs.  Signing off. PT Recommendation/Assessment: Patent does not need any further PT services No Skilled PT: All education completed;Patient will have necessary level of assist by caregiver at discharge;Patient is modified independent with all activity/mobility;Other (comment) (Pt Mod I except for min A with stairs.) PT Goals   N/A  PT Evaluation Precautions/Restrictions  Precautions Precautions: Other (comment) (Cervical precautions.) Required Braces or Orthoses: Yes Cervical Brace: Hard collar;Applied in supine position Restrictions Weight Bearing Restrictions: No Prior Functioning  Home Living Lives With: Significant other;Other (Comment) (Fiancee (home 24 hours)) Receives Help From: Other (Comment) (PRN from fiancee.) Type of Home: House Home Layout: One level Home Access: Stairs to  enter Entrance Stairs-Rails: Right Entrance Stairs-Number of Steps: 2 Home Adaptive Equipment: None Prior Function Level of Independence: Independent with basic ADLs;Independent with homemaking with ambulation;Independent with gait;Independent with transfers Able to Take Stairs?: Reciprically Cognition Cognition Arousal/Alertness: Awake/alert Overall Cognitive Status: Appears within functional limits for tasks assessed Orientation Level: Oriented X4 Sensation/Coordination Sensation Light Touch: Appears Intact (Only with right thumb tingling.) Stereognosis: Not tested Hot/Cold: Not tested Proprioception: Not tested Coordination Gross Motor Movements are Fluid and Coordinated: Yes Fine Motor Movements are Fluid and Coordinated: Not tested Extremity Assessment RUE Assessment RUE Assessment: Not tested LUE Assessment LUE Assessment: Not tested RLE Assessment RLE Assessment: Within Functional Limits LLE Assessment LLE Assessment: Within Functional Limits Pain 9/10 in anterior neck.  Pt premedicated with RN aware. Mobility (including Balance) Bed Mobility Bed Mobility: No Transfers Transfers: Yes Sit to Stand: 6: Modified independent (Device/Increase time);Other (comment) (3 trials.) Stand to Sit: 6: Modified independent (Device/Increase time);Other (comment) (3 trials.) Ambulation/Gait Ambulation/Gait: Yes Ambulation/Gait Assistance: 6: Modified independent (Device/Increase time) Ambulation Distance (Feet): 250 Feet Assistive device: None Gait Pattern:  (Some veering from smooth path with gait.) Stairs: Yes Stairs Assistance: 4: Min assist Stairs Assistance Details (indicate cue type and reason): Assist for balance with verbal cues for sequence. Stair Management Technique: One rail Right Number of Stairs: 2  Height of Stairs: 8  (8 inches.) Wheelchair Mobility Wheelchair Mobility: No  Posture/Postural Control Posture/Postural Control: No significant  limitations Balance Balance Assessed: No Exercise    End of Session PT - End of Session Equipment Utilized During Treatment: Gait belt;Cervical collar Activity Tolerance: Patient tolerated treatment well Patient left: in bed;Other (comment);with call bell in reach (Sitting  EOB.) Nurse Communication: Mobility status for transfers;Mobility status for ambulation General Behavior During Session: [facility] for tasks performed Cognition: [facility] for tasks performed  Cephus Shelling 12/23/2010, 9:03 AM  12/23/2010 Cephus Shelling, PT, DPT [phone]

## 2010-12-23 NOTE — Progress Notes (Signed)
Patient ID: Scott [name], male   DOB: 1961/01/15, 50 y.o.   MRN: [id number] Subjective:  The patient is alert and pleasant. He looks well. He is pleased that most of his numbness has resolved since surgery.  Objective: Vital signs in last 24 hours: Temp:  [98.5 F (36.9 C)-99.7 F (37.6 C)] 98.6 F (37 C) (11/30 0332) Pulse Rate:  [96-115] 96  (11/30 0332) Resp:  [4-22] 18  (11/30 0332) BP: (136-196)/(71-138) 148/90 mmHg (11/30 0332) SpO2:  [90 %-95 %] 95 % (11/30 0332) Weight:  [103.4 kg (227 lb 15.3 oz)] 227 lb 15.3 oz (103.4 kg) (11/29 1315)  Intake/Output from previous day: 11/29 0701 - 11/30 0700 In: 4120 [P.O.:360; I.V.:3250; IV Piggyback:500] Out: 2130 [Urine:1920; Drains:60; Blood:150] Intake/Output this shift:    Physical exam the patient is alert and oriented. His motor strength is normal in all 4 extremities. His dressing is clean and dry. There is no evidence of hematoma or shift.  Lab Results: No results found for this basename: WBC:2,HGB:2,HCT:2,PLT:2 in the last 72 hours BMET No results found for this basename: NA:2,K:2,CL:2,CO2:2,GLUCOSE:2,BUN:2,CREATININE:2,CALCIUM:2 in the last 72 hours  Studies/Results: Dg Cervical Spine 2-3 Views  12/22/2010  *RADIOLOGY REPORT*  Clinical Data: C3-C6 ACDF.  CERVICAL SPINE - 2-3 VIEW  Comparison: MRI 11/01/2010  Findings: First intraoperative image demonstrates anterior localizing instrument at C3-4.  Second lateral intraoperative image demonstrates changes of anterior fusion from C3-C6.  Normal alignment.  No hardware or bony complicating feature.  Gauze material noted in the prevertebral soft tissues.  IMPRESSION: ACDF C3-C6.  Original Report Authenticated By: Cyndie Chime, M.D.    Assessment/Plan: Postop day #1: The patient is doing well. His drain put out 60 cc so keep it in until tomorrow. We will tentatively plan to discharge him tomorrow. I have given him all his instructions and answered all his questions.  LOS: 1 day       Yetta Marceaux D 12/23/2010, 7:01 AM

## 2010-12-23 NOTE — Progress Notes (Signed)
Occupational Therapy Evaluation and D/C Patient Details Name: Huber Mathers MRN: [id number] DOB: [birth year] Today's Date: 12/23/2010  Problem List:  Patient Active Problem List  Diagnoses  . Cervical spondylosis with myelopathy    Past Medical History:  Past Medical History  Diagnosis Date  . COPD (chronic obstructive pulmonary disease)   . GERD (gastroesophageal reflux disease)   . Headache   . 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 Tonsillectomy   . Other surgical history     surgery on cheekbone and head for fall 2000  . 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 Multiple tooth extractions     OT Assessment/Plan/Recommendation OT Assessment Clinical Impression Statement: All education completed and patient at baseline level of functioning. No further acute OT needs at this time- signing off.  OT Evaluation Precautions/Restrictions  Precautions Precautions:  (cervical) Required Braces or Orthoses: Yes Cervical Brace: Hard collar- educated patient on don/doffing and care of cervical collar.  Restrictions Weight Bearing Restrictions: No Prior Functioning Home Living Lives With: Significant other;Other (Comment) (Fiancee (home 24 hours)) Receives Help From: Other (Comment) (PRN from fiancee.) Type of Home: House Home Layout: One level Home Access: Stairs to enter Entrance Stairs-Rails: Right Entrance Stairs-Number of Steps: 2 Bathroom Shower/Tub: Tub/shower unit;Curtain Bathroom Toilet: Standard Home Adaptive Equipment: None Prior Function Level of Independence: Independent with basic ADLs;Independent with homemaking with ambulation;Independent with gait;Independent with transfers Able to Take Stairs?: Reciprically ADL ADL Eating/Feeding: Performed;Independent Where Assessed -  Eating/Feeding: Edge of bed Grooming: Simulated;Teeth care;Supervision/safety Grooming Details (indicate cue type and reason): VC to maintain cervical precautions. Educated on adaptive techniques Where Assessed - Grooming: Standing at sink Upper Body Bathing: Simulated;Supervision/safety Where Assessed - Upper Body Bathing: Standing at sink Lower Body Bathing: Simulated (Min guard assist) Where Assessed - Lower Body Bathing: Standing at sink Upper Body Dressing: Simulated;Supervision/safety Upper Body Dressing Details (indicate cue type and reason): VC for maintaining cervical precautions. Educated on adaptive techniques Where Assessed - Upper Body Dressing: Sitting, bed Lower Body Dressing: Performed;Supervision/safety Lower Body Dressing Details (indicate cue type and reason): Educated on adaptive techniques to maintain cervical precautions Where Assessed - Lower Body Dressing: Sit to stand from bed Toilet Transfer: Simulated;Modified independent Toilet Transfer Method: Proofreader: Grab bars;Regular height toilet Toileting - Clothing Manipulation: Simulated;Independent Where Assessed - Toileting Clothing Manipulation: Standing Tub/Shower Transfer: Landscape architect Details (indicate cue type and reason): Sideways entry/exit with bilateral UE support on wall Vision/Perception  Vision - History Patient Visual Report: No change from baseline Cognition Cognition Arousal/Alertness: Awake/alert Overall Cognitive Status: Appears within functional limits for tasks assessed Orientation Level: Oriented X4 Sensation/Coordination Sensation Light Touch: Appears Intact (Only with right thumb tingling) Stereognosis: Not tested Hot/Cold: Not tested Proprioception: Not tested Coordination Gross Motor Movements are Fluid and Coordinated: Yes Fine Motor Movements are Fluid and Coordinated: Yes Extremity Assessment RUE Assessment RUE  Assessment: Within Functional Limits LUE Assessment LUE Assessment: Within Functional Limits Mobility  Bed Mobility Bed Mobility: No Transfers Sit to Stand: 6: Modified independent (Device/Increase time);From bed Stand to Sit: 6: Modified independent (Device/Increase time);To bed End of Session OT - End of Session Activity Tolerance: Patient limited by pain Patient left: in bed;with call bell in reach Nurse Communication: Mobility  status for transfers;Mobility status for ambulation General Behavior During Session: [facility] for tasks performed Cognition: [facility] for tasks performed   Eamon Tantillo 12/23/2010, 9:42 AM

## 2010-12-24 MED ORDER — DIAZEPAM 5 MG PO TABS: 5.0000 mg | ORAL_TABLET | Freq: Four times a day (QID) | ORAL | Status: AC | PRN

## 2010-12-24 MED ORDER — OXYCODONE-ACETAMINOPHEN 7.5-325 MG PO TABS: 1.0000 | ORAL_TABLET | Freq: Four times a day (QID) | ORAL | Status: DC | PRN

## 2010-12-24 NOTE — Discharge Summary (Signed)
Physician Discharge Summary  Patient ID: Scott [name] MRN: [id number] DOB/AGE: [birth year] 50 y.o.  Admit date: 12/22/2010 Discharge date: 12/24/2010  Admission Diagnoses:cervical spondylosis with myelopathy  Discharge Diagnoses:  Principal Problem:  *Cervical spondylosis with myelopathy   Discharged Condition: good  Hospital Course: Tolerating a regular diet, strength good, ambulating  Consults: none  Significant Diagnostic Studies: none  Treatments: surgery: ACDF 3/4,4/5,5/6  Discharge Exam: Blood pressure 117/71, pulse 100, temperature 99 F (37.2 C), temperature source Oral, resp. rate 18, weight 103.4 kg (227 lb 15.3 oz), SpO2 92.00%. General appearance: alert, cooperative and appears older than stated age Neck: wound clean and dry, no signs infection Neurologic: Mental status: Alert, oriented, thought content appropriate Motor: moving all extremities well Coordination: normal Gait: Normal  Disposition: Home or Self Care  Discharge Orders    Future Appointments: Provider: Department: Dept Phone: Center:   01/20/2011 10:45 AM Erick Colace, MD [facility] [phone] None     Current Discharge Medication List    START taking these medications   Details  diazepam (VALIUM) 5 MG tablet Take 1 tablet (5 mg total) by mouth every 6 (six) hours as needed (muscle spasms). Qty: 60 tablet, Refills: 0      CONTINUE these medications which have CHANGED   Details  oxyCODONE-acetaminophen (PERCOCET) 7.5-325 MG per tablet Take 1-2 tablets by mouth every 6 (six) hours as needed for pain. Qty: 80 tablet, Refills: 0      CONTINUE these medications which have NOT CHANGED   Details  albuterol-ipratropium (COMBIVENT) 18-103 MCG/ACT inhaler Inhale 2 puffs into the lungs 4 (four) times daily.      Fluticasone-Salmeterol (ADVAIR) 250-50 MCG/DOSE AEPB Inhale 1 puff into the lungs every 12 (twelve) hours.      ipratropium-albuterol (DUONEB) 0.5-2.5 (3) MG/3ML SOLN Take 3  mLs by nebulization 3 (three) times daily.      nicotine (NICODERM CQ - DOSED IN MG/24 HR) 7 mg/24hr patch Place 1 patch onto the skin daily.      omeprazole (PRILOSEC) 40 MG capsule Take 40 mg by mouth daily.           Signed: Cameryn Schum L 12/24/2010, 8:56 AM

## 2011-01-05 ENCOUNTER — Encounter (HOSPITAL_COMMUNITY): Payer: Self-pay | Admitting: *Deleted

## 2011-01-05 ENCOUNTER — Emergency Department (HOSPITAL_COMMUNITY)
Admission: EM | Admit: 2011-01-05 | Discharge: 2011-01-05 | Disposition: A | Discharge status: Home / Self Care | Payer: Medicaid Other | Attending: Emergency Medicine | Admitting: Emergency Medicine

## 2011-01-05 DIAGNOSIS — R062 Wheezing: Secondary | ICD-10-CM | POA: Insufficient documentation

## 2011-01-05 DIAGNOSIS — Z981 Arthrodesis status: Secondary | ICD-10-CM | POA: Insufficient documentation

## 2011-01-05 DIAGNOSIS — M542 Cervicalgia: Secondary | ICD-10-CM | POA: Insufficient documentation

## 2011-01-05 MED ORDER — OXYCODONE-ACETAMINOPHEN 7.5-325 MG PO TABS: 1.0000 | ORAL_TABLET | ORAL | Status: AC | PRN

## 2011-01-05 MED ORDER — OXYCODONE-ACETAMINOPHEN 5-325 MG PO TABS
1.0000 | ORAL_TABLET | Freq: Once | ORAL | Status: AC
  Administered 2011-01-05: 1 via ORAL
  Filled 2011-01-05: qty 1

## 2011-01-05 MED ORDER — DIAZEPAM 5 MG PO TABS: 5.0000 mg | ORAL_TABLET | Freq: Two times a day (BID) | ORAL | Status: AC

## 2011-01-05 NOTE — ED Notes (Signed)
Pt reports having a neck surgery on the 29th last month, ran out of percocet and valium yesterday, reports calling his doctor for a refill but was not able to get a hold of his doctor.  Requesting refill

## 2011-01-05 NOTE — ED Provider Notes (Signed)
History     CSN: [id number] Arrival date & time: 01/05/2011  6:26 PM   First MD Initiated Contact with Patient 01/05/11 1833      Chief Complaint  Patient presents with  . Medication Refill    (Consider location/radiation/quality/duration/timing/severity/associated sxs/prior treatment) Patient is a 50 y.o. male presenting with neck injury. The history is provided by the patient.  Neck Injury This is a chronic problem. The current episode started 1 to 4 weeks ago. The problem has been unchanged. Associated symptoms include neck pain. Pertinent negatives include no chills, fever, numbness or weakness. The symptoms are aggravated by nothing.  Pt states he had a neck surgery about 3 weeks ago. State was discharged with pain medications. Ran out today. Went to his doctor, was told his medications wont be ready till Monday. States that he has no changes in pain, no new injuries.   Past Medical History  Diagnosis Date  . COPD (chronic obstructive pulmonary disease)   . GERD (gastroesophageal reflux disease)   . Headache   . 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 Tonsillectomy   . Other surgical history     surgery on cheekbone and head for fall 2000  . 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 Multiple tooth extractions   . Anterior cervical decomp/discectomy fusion 12/22/2010    Procedure: ANTERIOR CERVICAL DECOMPRESSION/DISCECTOMY FUSION 3 LEVELS;  Surgeon: Cristi Loron;  Location: [facility];  Service: Neurosurgery;  Laterality: N/A;  Anterior cervical discectomy with fusion cervical three-four, four-five, and five sixCDF with Interbody Prosthesis, plating, and Bone Graft     No family history on file.  History  Substance Use Topics  . Smoking status: Current Everyday Smoker -- 0.2 packs/day for 37 years  .  Smokeless tobacco: Not on file  . Alcohol Use: No      Review of Systems  Constitutional: Negative for fever and chills.  HENT: Positive for neck pain.   Eyes: Negative.   Respiratory: Negative.   Cardiovascular: Negative.   Gastrointestinal: Negative.   Genitourinary: Negative.   Neurological: Negative for dizziness, weakness, light-headedness and numbness.  Psychiatric/Behavioral: Negative.     Allergies  Penicillins  Home Medications   Current Outpatient Rx  Name Route Sig Dispense Refill  . IPRATROPIUM-ALBUTEROL 18-103 MCG/ACT IN AERO Inhalation Inhale 2 puffs into the lungs 4 (four) times daily.      Marland Kitchen FLUTICASONE-SALMETEROL 250-50 MCG/DOSE IN AEPB Inhalation Inhale 1 puff into the lungs every 12 (twelve) hours.      . IPRATROPIUM-ALBUTEROL 0.5-2.5 (3) MG/3ML IN SOLN Nebulization Take 3 mLs by nebulization 3 (three) times daily.      Marland Kitchen NICOTINE 7 MG/24HR TD PT24 Transdermal Place 1 patch onto the skin daily.      Marland Kitchen OMEPRAZOLE 40 MG PO CPDR Oral Take 40 mg by mouth daily.      . OXYCODONE-ACETAMINOPHEN 7.5-325 MG PO TABS Oral Take 1-2 tablets by mouth every 6 (six) hours as needed for pain. 80 tablet 0  . NYQUIL PO Oral Take 5 mLs by mouth every 4 (four) hours as needed. For sleep/congestion.       BP 154/82  Pulse 102  Temp(Src) 98.8 F (37.1 C) (Oral)  Resp 20  SpO2 96%  Physical Exam  Constitutional: He is oriented to person, place, and time. He appears well-developed and well-nourished. No distress.  HENT:  Head: Normocephalic and atraumatic.  Eyes: Conjunctivae are normal.  Neck: Neck supple.       Wearing philadelphia collar. Tender with palpation over midline cervical spine. NO swelling, erythema, drainage around incision.  Cardiovascular: Normal rate, regular rhythm and normal heart sounds.   Pulmonary/Chest: Effort normal. He has wheezes. He has no rales.  Musculoskeletal:       Equal and normal ES strength bilaterally, normal grip strength and equal  bilaterally  Neurological: He is alert and oriented to person, place, and time.  Skin: Skin is warm.  Psychiatric: He has a normal mood and affect.    ED Course  Procedures (including critical care time)  Pt post cervical surgery 2 weeks ago. Ran out of pain medications. Denies fever, new symptoms, numbness or weakness in upper or lower extremities. No other complaints. Will refill prescriptions until Monday when he is supposed to get refills from his neurosurgeon   MDM          Lottie Mussel, PA 01/06/11 [phone]

## 2011-01-09 NOTE — ED Provider Notes (Signed)
Medical screening examination/treatment/procedure(s) were performed by non-physician practitioner and as supervising physician I was immediately available for consultation/collaboration.   Hung Rhinesmith, MD 01/09/11 0808 

## 2011-01-20 ENCOUNTER — Ambulatory Visit: Payer: Medicaid Other | Admitting: Physical Medicine & Rehabilitation

## 2011-01-31 ENCOUNTER — Ambulatory Visit: Payer: Medicaid Other | Admitting: Physical Medicine & Rehabilitation

## 2011-02-10 ENCOUNTER — Ambulatory Visit: Payer: Medicaid Other | Admitting: Physical Medicine & Rehabilitation

## 2011-02-13 ENCOUNTER — Encounter: Payer: Medicaid Other | Attending: Physical Medicine & Rehabilitation

## 2011-02-13 ENCOUNTER — Ambulatory Visit (HOSPITAL_BASED_OUTPATIENT_CLINIC_OR_DEPARTMENT_OTHER): Payer: Medicaid Other | Admitting: Physical Medicine & Rehabilitation

## 2011-02-13 DIAGNOSIS — G8929 Other chronic pain: Secondary | ICD-10-CM | POA: Insufficient documentation

## 2011-02-13 DIAGNOSIS — M5430 Sciatica, unspecified side: Secondary | ICD-10-CM

## 2011-02-13 DIAGNOSIS — R209 Unspecified disturbances of skin sensation: Secondary | ICD-10-CM | POA: Insufficient documentation

## 2011-02-13 DIAGNOSIS — M542 Cervicalgia: Secondary | ICD-10-CM | POA: Insufficient documentation

## 2011-02-13 DIAGNOSIS — M5137 Other intervertebral disc degeneration, lumbosacral region: Secondary | ICD-10-CM | POA: Insufficient documentation

## 2011-02-13 DIAGNOSIS — M549 Dorsalgia, unspecified: Secondary | ICD-10-CM | POA: Insufficient documentation

## 2011-02-13 DIAGNOSIS — M51379 Other intervertebral disc degeneration, lumbosacral region without mention of lumbar back pain or lower extremity pain: Secondary | ICD-10-CM | POA: Insufficient documentation

## 2011-02-13 DIAGNOSIS — M961 Postlaminectomy syndrome, not elsewhere classified: Secondary | ICD-10-CM

## 2011-02-13 NOTE — Assessment & Plan Note (Signed)
A 51 year old male who underwent a C3 through C6 ACDF for cervical stenosis and myelopathy.  He had no postoperative complications. Postoperative films showed good alignment.  No hardware complications. He has seen Dr. Lovell Sheehan earlier this month and he has prescribed his pain medicines postoperatively.  He states that he went back to [place] for his father's funeral, left a bag that also contained his medications.  He states he has been out of medications for 8 days.  He reports withdrawal symptoms such as diarrhea and shakiness.  His next followup appointment with Dr. Lovell Sheehan is May 02, 2011.  He had been receiving Percocet 10/325 q.i.d.  Pain is now at 10.  He could walk 7-10 minutes at a time.  He needs help with feeding, dressing, bathing, meal prep, household duties and shopping.  He has been on disability or he has been last employed March 07, 2009.  REVIEW OF SYSTEMS:  Positive for weakness, numbness, tingling, trouble walking, dizziness, anxiety, respiratory infections, coughing, wheezing and shortness of breath.  He still has tingling in his right digits 1, 2 and 3 greater than the left side. His left side improved after the surgery.  His toe tingling improved after surgery.  SOCIAL HISTORY:  He is single, lives with his girlfriend.  PHYSICAL EXAMINATION:  VITAL SIGNS:  Blood pressure 162/86, pulse 90, respirations 24, O2 sats 91% on room air.  Weight 224 pounds, height 5 feet, 8 inches. GENERAL:  No acute distress.  Mood and affect appropriate. EXTREMITIES:  His motor strength is 5.  Lower extremity strength is normal.  He has reduced neck range of motion.  He has a nicely healing surgical scar on the anterior left cervical area. NEUROLOGIC:  Mood and affect are appropriate.  IMPRESSION: 1. Cervical postlaminectomy syndrome. 2. Right upper extremity paresthesias.  He does have positive reverse     Phalen sign.  He has decreased sensation in the radial 3 digits.  He may indeed have carpal tunnel superimposed on some cervical     radiculitis and myelopathy.  We will check EMG and CV. 3. Check urine drug screen.  He should not have any signs of oxycodone     if he has not had any pain medication for 8 days.  I will write     another prescription, but reduced into Percocet 7.5/325 on a q.i.d.     basis and then wean down to other t.i.d. or the 5/325, next time. 4. Check EMG and CV of the upper extremities to look for median     neuropathy.  ADDENDUM:  He is under pain contract with this office now that he is 2 months postop.  We will resume his pain management.     Erick Colace, M.D. Electronically Signed    AEK/MedQ D:  02/13/2011 12:48:01  T:  02/13/2011 18:59:11  Job #:  [id number]  cc:   Cristi Loron, M.D. Fax: [phone]  Renaye Rakers, M.D. Fax: [phone]

## 2011-03-07 ENCOUNTER — Ambulatory Visit (HOSPITAL_BASED_OUTPATIENT_CLINIC_OR_DEPARTMENT_OTHER): Payer: Medicaid Other | Admitting: Physical Medicine & Rehabilitation

## 2011-03-07 ENCOUNTER — Encounter: Payer: Medicaid Other | Attending: Physical Medicine & Rehabilitation

## 2011-03-07 DIAGNOSIS — M542 Cervicalgia: Secondary | ICD-10-CM | POA: Insufficient documentation

## 2011-03-07 DIAGNOSIS — M549 Dorsalgia, unspecified: Secondary | ICD-10-CM | POA: Insufficient documentation

## 2011-03-07 DIAGNOSIS — G8929 Other chronic pain: Secondary | ICD-10-CM | POA: Insufficient documentation

## 2011-03-07 DIAGNOSIS — M5137 Other intervertebral disc degeneration, lumbosacral region: Secondary | ICD-10-CM | POA: Insufficient documentation

## 2011-03-07 DIAGNOSIS — R209 Unspecified disturbances of skin sensation: Secondary | ICD-10-CM | POA: Insufficient documentation

## 2011-03-07 DIAGNOSIS — M51379 Other intervertebral disc degeneration, lumbosacral region without mention of lumbar back pain or lower extremity pain: Secondary | ICD-10-CM | POA: Insufficient documentation

## 2011-04-04 ENCOUNTER — Encounter: Payer: Self-pay | Admitting: *Deleted

## 2011-04-04 ENCOUNTER — Encounter: Payer: Medicaid Other | Attending: Physical Medicine & Rehabilitation | Admitting: *Deleted

## 2011-04-04 VITALS — BP 150/87 | HR 111 | Resp 18 | Ht 68.0 in | Wt 224.0 lb

## 2011-04-04 DIAGNOSIS — R209 Unspecified disturbances of skin sensation: Secondary | ICD-10-CM | POA: Insufficient documentation

## 2011-04-04 DIAGNOSIS — M5430 Sciatica, unspecified side: Secondary | ICD-10-CM

## 2011-04-04 DIAGNOSIS — M961 Postlaminectomy syndrome, not elsewhere classified: Secondary | ICD-10-CM

## 2011-04-04 MED ORDER — OXYCODONE-ACETAMINOPHEN 7.5-325 MG PO TABS: 1.0000 | ORAL_TABLET | Freq: Four times a day (QID) | ORAL | Status: DC

## 2011-04-04 NOTE — Progress Notes (Signed)
Notes difference in pain relief level with percocet dose of 7.5mg  vs 10 mg. Occasionally takes an extra half tablet. Pill count WNL today. Daughter is in jail for stealing his medication. No other questions voiced for MD.

## 2011-05-01 ENCOUNTER — Encounter: Payer: Self-pay | Admitting: Physical Medicine & Rehabilitation

## 2011-05-04 ENCOUNTER — Encounter: Payer: Self-pay | Admitting: *Deleted

## 2011-05-04 ENCOUNTER — Encounter: Payer: Medicaid Other | Attending: Physical Medicine & Rehabilitation | Admitting: *Deleted

## 2011-05-04 VITALS — BP 139/82 | HR 110 | Resp 18 | Ht 68.0 in | Wt 219.0 lb

## 2011-05-04 DIAGNOSIS — M5430 Sciatica, unspecified side: Secondary | ICD-10-CM

## 2011-05-04 DIAGNOSIS — M961 Postlaminectomy syndrome, not elsewhere classified: Secondary | ICD-10-CM | POA: Insufficient documentation

## 2011-05-04 DIAGNOSIS — R209 Unspecified disturbances of skin sensation: Secondary | ICD-10-CM | POA: Insufficient documentation

## 2011-05-04 MED ORDER — OXYCODONE-ACETAMINOPHEN 7.5-325 MG PO TABS: 1.0000 | ORAL_TABLET | Freq: Four times a day (QID) | ORAL | Status: DC

## 2011-05-04 NOTE — Progress Notes (Signed)
Prescribed diazepam for neck/shoulder spasm by Dr Terrilee Files. Discussed staying with qid dosing of oxycodone. He runs out a few days early and suffers with diarrhea, sweats and increased pain. Discussed that this is worse for him than having a slightly worse pain on other days. He can discuss increasing strength of med next month with Dr Wynn Banker. No other questions voiced for MD.

## 2011-05-05 ENCOUNTER — Institutional Professional Consult (permissible substitution): Payer: Medicaid Other | Admitting: Internal Medicine

## 2011-05-16 ENCOUNTER — Other Ambulatory Visit: Payer: Self-pay | Admitting: Family Medicine

## 2011-05-30 ENCOUNTER — Ambulatory Visit (INDEPENDENT_AMBULATORY_CARE_PROVIDER_SITE_OTHER): Payer: Medicaid Other | Admitting: Emergency Medicine

## 2011-05-30 ENCOUNTER — Encounter: Payer: Self-pay | Admitting: Emergency Medicine

## 2011-05-30 VITALS — BP 112/78 | HR 106 | Temp 99.0°F | Ht 68.5 in | Wt 218.6 lb

## 2011-05-30 DIAGNOSIS — J309 Allergic rhinitis, unspecified: Secondary | ICD-10-CM | POA: Insufficient documentation

## 2011-05-30 DIAGNOSIS — E785 Hyperlipidemia, unspecified: Secondary | ICD-10-CM

## 2011-05-30 DIAGNOSIS — J449 Chronic obstructive pulmonary disease, unspecified: Secondary | ICD-10-CM

## 2011-05-30 MED ORDER — LORATADINE 10 MG PO TABS: 10.0000 mg | ORAL_TABLET | Freq: Every day | ORAL | Status: DC

## 2011-05-30 MED ORDER — FLUTICASONE PROPIONATE 50 MCG/ACT NA SUSP: 2.0000 | Freq: Two times a day (BID) | NASAL | Status: DC

## 2011-05-30 NOTE — Assessment & Plan Note (Signed)
Many of his sx sound UA, and he is overusing his BD's trying to treat (ineffectively) - continue the omeprazole - start loratadine + fluticasone nasal spray

## 2011-05-30 NOTE — Assessment & Plan Note (Signed)
Severity unclear. He has had PFT. He is overusing his BD's. Advair may not be good choice for him due to UA irritation - combivent qid prn - continue advair for now - prn albuterol - suspect many of his sx will be better with allergy regimen

## 2011-05-30 NOTE — Patient Instructions (Addendum)
Please continue Advair and combivent as you are using them Start loratadine 10mg  daily Start fluticasone nasal spray 2 sprays each nostril twice a day Follow with Dr Delton Coombes in 1 month

## 2011-05-30 NOTE — Progress Notes (Signed)
Subjective:    Patient ID: Scott [name], male    DOB: 1960-07-25, 51 y.o.   MRN: [id number]  HPI 51 yo man, tobacco user (smoking 1/2 pk/day), hx of COPD in a study at [facility]. Has had PFT at [facility] and ? At [facility] as well. Now following with Dr Reche Dixon at [facility]. He has been having increased combivent use for SOB. Coughs every day, has been worse since anterior cervical fusion in November. Sputum is clear to yellow.  He is also on Advair.  He has bad GERD, better w omeprazole. Has allergy sx, on no meds.    Review of Systems  Constitutional: Positive for unexpected weight change. Negative for fever.  HENT: Positive for congestion, sore throat and trouble swallowing. Negative for ear pain, nosebleeds, rhinorrhea, sneezing, dental problem, postnasal drip and sinus pressure.   Eyes: Negative.  Negative for redness and itching.  Respiratory: Positive for cough and shortness of breath. Negative for chest tightness and wheezing.   Cardiovascular: Negative.  Negative for palpitations and leg swelling.  Gastrointestinal: Negative.  Negative for nausea and vomiting.  Genitourinary: Negative.  Negative for dysuria.  Musculoskeletal: Negative.  Negative for joint swelling.  Skin: Negative.  Negative for rash.  Neurological: Negative.  Negative for headaches.  Hematological: Negative.  Does not bruise/bleed easily.  Psychiatric/Behavioral: Negative for dysphoric mood. The patient is nervous/anxious.     Past Medical History  Diagnosis Date  . COPD (chronic obstructive pulmonary disease)   . GERD (gastroesophageal reflux disease)   . Headache   . 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   History   Social History  . Marital Status: Single    Spouse Name: N/A    Number of Children: N/A  . Years of Education: N/A  From: [place], [place], then to [facility]  Occupational History  . Not on file.  Worked maintenance in the past, ? Asbestos exposure in  past .    Social History Main Topics  . Smoking status: Current Everyday Smoker -- 0.2 packs/day for 37 years  . Smokeless tobacco: Not on file  . Alcohol Use: No  . Drug Use: No  . Sexually Active:    Other Topics Concern  . Not on file   Social History Narrative  . No narrative on file     Allergies  Allergen Reactions  . Penicillins Anaphylaxis     Outpatient Prescriptions Prior to Visit  Medication Sig Dispense Refill  . albuterol-ipratropium (COMBIVENT) 18-103 MCG/ACT inhaler Inhale 2 puffs into the lungs 4 (four) times daily.        . diazepam (VALIUM) 5 MG tablet Take 5 mg by mouth 2 (two) times daily.      . Fluticasone-Salmeterol (ADVAIR) 250-50 MCG/DOSE AEPB Inhale 1 puff into the lungs every 12 (twelve) hours.        Marland Kitchen ipratropium-albuterol (DUONEB) 0.5-2.5 (3) MG/3ML SOLN Take 3 mLs by nebulization 3 (three) times daily.        . nicotine (NICODERM CQ - DOSED IN MG/24 HR) 7 mg/24hr patch Place 1 patch onto the skin daily.        Marland Kitchen omeprazole (PRILOSEC) 40 MG capsule Take 40 mg by mouth daily.        Marland Kitchen oxyCODONE-acetaminophen (PERCOCET) 7.5-325 MG per tablet Take 1 tablet by mouth 4 (four) times daily.  120 tablet  0  . Pseudoeph-Doxylamine-DM-APAP (NYQUIL PO) Take 5  mLs by mouth every 4 (four) hours as needed. For sleep/congestion.       . rosuvastatin (CRESTOR) 10 MG tablet Take 10 mg by mouth daily.           Objective:   Physical Exam  Gen: Pleasant, well-nourished, in no distress,  anxious  ENT: No lesions,  mouth clear,  oropharynx clear, no postnasal drip,   Neck: No JVD, no TMG, no carotid bruits, insp stridor  Lungs: No use of accessory muscles, distant, clear without rales or rhonchi  Cardiovascular: RRR, heart sounds normal, no murmur or gallops, no peripheral edema  Musculoskeletal: No deformities, no cyanosis or clubbing  Neuro: alert, non focal  Skin: Warm, no lesions or rashes     Assessment & Plan:  Allergic rhinitis Many of his sx  sound UA, and he is overusing his BD's trying to treat (ineffectively) - continue the omeprazole - start loratadine + fluticasone nasal spray   COPD (chronic obstructive pulmonary disease) Severity unclear. He has had PFT. He is overusing his BD's. Advair may not be good choice for him due to UA irritation - combivent qid prn - continue advair for now - prn albuterol - suspect many of his sx will be better with allergy regimen

## 2011-06-01 ENCOUNTER — Ambulatory Visit (HOSPITAL_BASED_OUTPATIENT_CLINIC_OR_DEPARTMENT_OTHER): Payer: Medicaid Other | Admitting: Physical Medicine & Rehabilitation

## 2011-06-01 ENCOUNTER — Encounter: Payer: Self-pay | Admitting: Physical Medicine & Rehabilitation

## 2011-06-01 ENCOUNTER — Encounter: Payer: Medicaid Other | Attending: Physical Medicine & Rehabilitation

## 2011-06-01 VITALS — BP 141/112 | HR 90 | Resp 18 | Ht 68.0 in | Wt 223.0 lb

## 2011-06-01 DIAGNOSIS — G8929 Other chronic pain: Secondary | ICD-10-CM | POA: Insufficient documentation

## 2011-06-01 DIAGNOSIS — R209 Unspecified disturbances of skin sensation: Secondary | ICD-10-CM | POA: Insufficient documentation

## 2011-06-01 DIAGNOSIS — M5137 Other intervertebral disc degeneration, lumbosacral region: Secondary | ICD-10-CM | POA: Insufficient documentation

## 2011-06-01 DIAGNOSIS — M51379 Other intervertebral disc degeneration, lumbosacral region without mention of lumbar back pain or lower extremity pain: Secondary | ICD-10-CM | POA: Insufficient documentation

## 2011-06-01 DIAGNOSIS — M549 Dorsalgia, unspecified: Secondary | ICD-10-CM | POA: Insufficient documentation

## 2011-06-01 DIAGNOSIS — M542 Cervicalgia: Secondary | ICD-10-CM | POA: Insufficient documentation

## 2011-06-01 DIAGNOSIS — M961 Postlaminectomy syndrome, not elsewhere classified: Secondary | ICD-10-CM

## 2011-06-01 MED ORDER — OXYCODONE-ACETAMINOPHEN 7.5-325 MG PO TABS: 1.0000 | ORAL_TABLET | Freq: Four times a day (QID) | ORAL | Status: DC

## 2011-06-01 NOTE — Progress Notes (Signed)
A 51 year old male who underwent a C3 through C6 ACDF 01/21/11 for cervical  stenosis and myelopathy. He had no postoperative complications.  Postoperative films showed good alignment. No hardware complications.  He has seen Dr. Lovell Sheehan who started valium.  I discussed potential interactions with percoet  He had been  receiving Percocet 7.5/325 q.i.d. Pain is now at 10. He could walk 7-10  minutes at a time. He needs help with feeding, dressing, bathing, meal  prep, household duties and shopping. He has been on disability or he  has been last employed March 07, 2009.  REVIEW OF SYSTEMS: Positive for weakness, numbness, tingling, trouble  walking, dizziness, anxiety, respiratory infections, coughing, wheezing  and shortness of breath. He still has tingling in his right digits 1, 2  and 3 greater than the left side. His left side improved after the  surgery. His toe tingling improved after surgery.  SOCIAL HISTORY: He is single, lives with his girlfriend.  PHYSICAL EXAMINATION: VITAL SIGNS: Blood pressure 162/86, pulse 90,  respirations 24, O2 sats 91% on room air. Weight 224 pounds, height 5  feet, 8 inches.  GENERAL: No acute distress. Mood and affect appropriate.  EXTREMITIES: His motor strength is 5. Lower extremity strength is  normal. He has reduced neck range of motion. He has a nicely healing  surgical scar on the anterior left cervical area.  Back with tenderness L4-S1.  Poor posture leans to L with R upper trap tightness NEUROLOGIC: Mood and affect are appropriate.  IMPRESSION:  1. Cervical postlaminectomy syndrome.  2. Right upper extremity paresthesias. EMG showed radiculopathy 3. Check urine drug screen. He should not have any signs of oxycodone  if he has not had any pain medication for 8 days. I will write  another prescription, but reduced into Percocet 7.5/325 on a q.i.d.  basis and then wean down to other t.i.d. or the 5/325, next time.  4. Postural issues PT  Rx Erick Colace, M.D.  Electronically Signed  AEK/MedQ  D: 02/13/2011 12:48:01 T: 02/13/2011 18:59:11 Job #: [id number]  cc: Cristi Loron, M.D.  Fax: [phone]

## 2011-06-01 NOTE — Progress Notes (Signed)
  Subjective:    Patient ID: Scott [name], male    DOB: 1960-03-12, 51 y.o.   MRN: [id number]  HPI Pain Inventory Average Pain 10 Pain Right Now 8 My pain is sharp, burning, stabbing and tingling  In the last 24 hours, has pain interfered with the following? General activity 10 Relation with others 10 Enjoyment of life 10 What TIME of day is your pain at its worst? most of the day Sleep (in general) Poor  Pain is worse with: walking, bending, sitting and standing Pain improves with: rest Relief from Meds: 8  Mobility walk without assistance how many minutes can you walk? 5-7 ability to climb steps?  no do you drive?  no use a wheelchair  Function disabled: date disabled 2011 I need assistance with the following:  feeding, dressing, bathing, meal prep, household duties and shopping  Neuro/Psych weakness numbness tingling dizziness anxiety loss of taste or smell  Prior Studies Any changes since last visit?  no  Physicians involved in your care [facility]        Review of Systems  Constitutional: Positive for unexpected weight change.  HENT: Negative.   Eyes: Negative.   Respiratory: Positive for cough, shortness of breath and wheezing.   Cardiovascular: Negative.   Gastrointestinal: Negative.   Genitourinary: Negative.   Musculoskeletal: Negative.   Skin: Negative.   Neurological: Negative.   Hematological: Negative.   Psychiatric/Behavioral: Negative.        Objective:   Physical Exam        Assessment & Plan:

## 2011-06-01 NOTE — Patient Instructions (Signed)
You'll be scheduled for outpatient physical therapy on [employer]. He'll go 3-4 visits Next appointment will be with our new physician assistant Clydie Braun. Overall goal is to reduce your pain medication dosage to 5 mg next visit.

## 2011-06-14 ENCOUNTER — Ambulatory Visit: Payer: Medicaid Other | Attending: Physical Medicine & Rehabilitation | Admitting: Physical Therapy

## 2011-06-14 DIAGNOSIS — IMO0001 Reserved for inherently not codable concepts without codable children: Secondary | ICD-10-CM | POA: Insufficient documentation

## 2011-06-14 DIAGNOSIS — M545 Low back pain, unspecified: Secondary | ICD-10-CM | POA: Insufficient documentation

## 2011-06-14 DIAGNOSIS — M542 Cervicalgia: Secondary | ICD-10-CM | POA: Insufficient documentation

## 2011-06-20 ENCOUNTER — Ambulatory Visit: Payer: Medicaid Other | Admitting: Physical Therapy

## 2011-06-22 ENCOUNTER — Ambulatory Visit: Payer: Medicaid Other | Admitting: Physical Therapy

## 2011-06-29 ENCOUNTER — Encounter: Payer: Self-pay | Admitting: Physical Medicine and Rehabilitation

## 2011-06-29 ENCOUNTER — Encounter: Payer: Medicaid Other | Attending: Physical Medicine & Rehabilitation | Admitting: Physical Medicine and Rehabilitation

## 2011-06-29 VITALS — BP 145/120 | HR 94 | Resp 20 | Ht 68.0 in | Wt 219.4 lb

## 2011-06-29 DIAGNOSIS — M51379 Other intervertebral disc degeneration, lumbosacral region without mention of lumbar back pain or lower extremity pain: Secondary | ICD-10-CM | POA: Insufficient documentation

## 2011-06-29 DIAGNOSIS — M542 Cervicalgia: Secondary | ICD-10-CM

## 2011-06-29 DIAGNOSIS — M5137 Other intervertebral disc degeneration, lumbosacral region: Secondary | ICD-10-CM | POA: Insufficient documentation

## 2011-06-29 DIAGNOSIS — M4712 Other spondylosis with myelopathy, cervical region: Secondary | ICD-10-CM | POA: Insufficient documentation

## 2011-06-29 DIAGNOSIS — M545 Low back pain, unspecified: Secondary | ICD-10-CM

## 2011-06-29 DIAGNOSIS — Z981 Arthrodesis status: Secondary | ICD-10-CM | POA: Insufficient documentation

## 2011-06-29 DIAGNOSIS — M961 Postlaminectomy syndrome, not elsewhere classified: Secondary | ICD-10-CM

## 2011-06-29 DIAGNOSIS — F17200 Nicotine dependence, unspecified, uncomplicated: Secondary | ICD-10-CM | POA: Insufficient documentation

## 2011-06-29 DIAGNOSIS — J438 Other emphysema: Secondary | ICD-10-CM | POA: Insufficient documentation

## 2011-06-29 DIAGNOSIS — G8929 Other chronic pain: Secondary | ICD-10-CM | POA: Insufficient documentation

## 2011-06-29 DIAGNOSIS — K219 Gastro-esophageal reflux disease without esophagitis: Secondary | ICD-10-CM | POA: Insufficient documentation

## 2011-06-29 MED ORDER — OXYCODONE-ACETAMINOPHEN 7.5-325 MG PO TABS: 1.0000 | ORAL_TABLET | Freq: Four times a day (QID) | ORAL | Status: DC

## 2011-06-29 NOTE — Patient Instructions (Signed)
Continue with PT, advised patient to talk to his PT, if the exercises increase his pain. Continue with medication.

## 2011-06-29 NOTE — Progress Notes (Signed)
Subjective:    Patient ID: Scott [name], male    DOB: 17-Mar-1960, 51 y.o.   MRN: [id number]  HPI The patient complains about chronic neck and low back pain which radiates into his shoulders bilateral.  The problem has been pretty stable, but the patient states that he has increased pain after physical therapy, and that he has finished his Percocet early, because he had to take more tablets per day, to get pain relief.   Pain Inventory Average Pain 10 Pain Right Now 8 My pain is constant, sharp, burning, dull, stabbing and tingling  In the last 24 hours, has pain interfered with the following? General activity 10 Relation with others 10 Enjoyment of life 10 What TIME of day is your pain at its worst? all of the time Sleep (in general) Poor  Pain is worse with: walking, bending, sitting and standing Pain improves with: rest and medication Relief from Meds: 8  Mobility walk without assistance use a cane how many minutes can you walk? 5-7 ability to climb steps?  no do you drive?  no use a wheelchair  Function disabled: date disabled 20011 I need assistance with the following:  dressing, bathing, meal prep, household duties and shopping  Neuro/Psych weakness numbness tingling dizziness anxiety loss of taste or smell  Prior Studies Any changes since last visit?  no  Physicians involved in your care Any changes since last visit?  no   Family History  Problem Relation Age of Onset  . Emphysema Mother   . COPD Mother   . Asthma Brother   . Asthma Sister   . Heart disease Maternal Uncle    History   Social History  . Marital Status: Single    Spouse Name: N/A    Number of Children: N/A  . Years of Education: N/A   Occupational History  . DISABLED    Social History Main Topics  . Smoking status: Current Everyday Smoker -- 0.5 packs/day for 37 years  . Smokeless tobacco: Never Used  . Alcohol Use: No  . Drug Use: No  . Sexually Active: None   Other  Topics Concern  . None   Social History Narrative  . None   Past Surgical History  Procedure Date  . Tonsillectomy   . Other surgical history     surgery on cheekbone and head for fall 2000  . 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 Multiple tooth extractions   . Anterior cervical decomp/discectomy fusion 12/22/2010    Procedure: ANTERIOR CERVICAL DECOMPRESSION/DISCECTOMY FUSION 3 LEVELS;  Surgeon: Cristi Loron;  Location: [facility];  Service: Neurosurgery;  Laterality: N/A;  Anterior cervical discectomy with fusion cervical three-four, four-five, and five sixCDF with Interbody Prosthesis, plating, and Bone Graft    Past Medical History  Diagnosis Date  . COPD (chronic obstructive pulmonary disease)   . GERD (gastroesophageal reflux disease)   . Headache   . Arthritis     states MD told him he has arthritis in spine   BP 145/120  Pulse 94  Resp 20  Ht 5\' 8"  (1.727 m)  Wt 219 lb 6.4 oz (99.519 kg)  BMI 33.36 kg/m2  SpO2 96%   Review of Systems  Constitutional: Positive for diaphoresis and unexpected weight change.  Respiratory: Positive for cough, shortness of breath and wheezing.   Musculoskeletal: Positive  for back pain and gait problem.  Neurological: Positive for dizziness, weakness and numbness.  Psychiatric/Behavioral: The patient is nervous/anxious.        Objective:   Physical Exam  Constitutional: He is oriented to person, place, and time. He appears well-developed and well-nourished.  HENT:  Head: Normocephalic.  Pulmonary/Chest: He is in respiratory distress.  Musculoskeletal: He exhibits tenderness.  Neurological: He is alert and oriented to person, place, and time.  Skin: Skin is warm and dry.  Psychiatric: He has a normal mood and affect.   Symmetric normal motor tone is noted throughout. Normal muscle bulk. Muscle testing reveals 4/5 muscle  strength of the upper extremity, and 4-/5 of the lower extremity. Full range of motion in upper and lower extremities. ROM of C-spine is severely restricted.   DTR in the upper and lower extremity are present and symmetric 3+. No clonus is noted.  Patient arises from chair with some difficulty. Wide based gait with normal arm swing bilateral.  Patient is extremely SOB today.       Assessment & Plan:  This is a 51 year old  male with 1. Cervical spondylosis with myelopathy 2. Status post ACDF C3 to C6 3. 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 Neck pain 5. low back pain 6. Emphysema, COPD Plan : The patient states it back he finished his Percocet the because he had increased pain after physical therapy. We discussed that the patient should continue with his physical therapy. However, I advised the patient to talk to the physical therapist , or tell the physical therapist that he should call me when the exercises are too painful for the patient. I advised the patient to not take more of his medicine, than is prescribed to him. Follow up in with PA in 1 month.

## 2011-07-03 ENCOUNTER — Ambulatory Visit: Payer: Medicare Other | Attending: Physical Medicine & Rehabilitation | Admitting: Physical Therapy

## 2011-07-03 DIAGNOSIS — M542 Cervicalgia: Secondary | ICD-10-CM | POA: Insufficient documentation

## 2011-07-03 DIAGNOSIS — M545 Low back pain, unspecified: Secondary | ICD-10-CM | POA: Insufficient documentation

## 2011-07-03 DIAGNOSIS — IMO0001 Reserved for inherently not codable concepts without codable children: Secondary | ICD-10-CM | POA: Insufficient documentation

## 2011-07-10 ENCOUNTER — Ambulatory Visit: Payer: Medicare Other | Admitting: Physical Therapy

## 2011-07-11 ENCOUNTER — Other Ambulatory Visit: Payer: Self-pay | Admitting: *Deleted

## 2011-07-11 ENCOUNTER — Ambulatory Visit (INDEPENDENT_AMBULATORY_CARE_PROVIDER_SITE_OTHER): Payer: Medicare Other | Admitting: Emergency Medicine

## 2011-07-11 ENCOUNTER — Encounter: Payer: Self-pay | Admitting: Emergency Medicine

## 2011-07-11 VITALS — BP 124/86 | HR 103 | Temp 98.8°F | Ht 68.0 in | Wt 221.8 lb

## 2011-07-11 DIAGNOSIS — J449 Chronic obstructive pulmonary disease, unspecified: Secondary | ICD-10-CM

## 2011-07-11 MED ORDER — ALBUTEROL SULFATE HFA 108 (90 BASE) MCG/ACT IN AERS: 2.0000 | INHALATION_SPRAY | RESPIRATORY_TRACT | Status: DC | PRN

## 2011-07-11 NOTE — Assessment & Plan Note (Signed)
Restart loratadine and fluticasone

## 2011-07-11 NOTE — Progress Notes (Signed)
  Subjective:    Patient ID: Scott [name], male    DOB: November 29, 1960, 51 y.o.   MRN: [id number]  HPI 51 yo man, tobacco user (smoking 1/2 pk/day), hx of COPD in a study at [facility]. Has had PFT at [facility] and ? At [facility] as well. Now following with Dr Reche Dixon at [facility]. He has been having increased combivent use for SOB. Coughs every day, has been worse since anterior cervical fusion in November. Sputum is clear to yellow.  He is also on Advair.  He has bad GERD, better w omeprazole. Has allergy sx, on no meds.   ROV 07/11/11 -- COPD, still smoking 5 cig a day. Tells me that his breathing is the same to worse. Taking Advair bid, combivent ordered qid but he uses prn. Has nicotine patches but not using. Discussed cigarette cessation in detail today. Talked about setting a quit date Tells me that he benefited from the loratadine and fluticasone but stopped taking.      Objective:   Physical Exam Filed Vitals:   07/11/11 1518  BP: 124/86  Pulse: 103  Temp: 98.8 F (37.1 C)   Gen: Pleasant, well-nourished, in no distress,  anxious  ENT: No lesions,  mouth clear,  oropharynx clear, no postnasal drip,   Neck: No JVD, no TMG, no carotid bruits, insp stridor  Lungs: No use of accessory muscles, distant, clear without rales or rhonchi  Cardiovascular: RRR, heart sounds normal, no murmur or gallops, no peripheral edema  Musculoskeletal: No deformities, no cyanosis or clubbing  Neuro: alert, non focal  Skin: Warm, no lesions or rashes     Assessment & Plan:  Allergic rhinitis Restart loratadine and fluticasone  COPD (chronic obstructive pulmonary disease) Clarified with him that he shouldn't take the combivent more than qid Advair bid Albuterol prn Discussed tobacco cessation strategy in detail. He will call our office to let us know the quit date rov 2 mon

## 2011-07-11 NOTE — Assessment & Plan Note (Addendum)
Clarified with him that he shouldn't take the combivent more than qid Advair bid Albuterol prn Discussed tobacco cessation strategy in detail. He will call our office to let us know the quit date rov 2 mon

## 2011-07-11 NOTE — Patient Instructions (Addendum)
Please use your combivent only 2 puffs 4 times a day Use your Advair twice a day Use your albuterol inhaler for shortness of breath in between, up to every 4 hours if needed We talked about strategies to help you stop smoking, and when to use the nicotine patch.  Pleas call our office to let us know your cigarette quit date so we can help you stop Follow with Dr Delton Coombes in 2 months

## 2011-07-21 ENCOUNTER — Ambulatory Visit: Payer: Medicare Other | Admitting: Rehabilitation

## 2011-07-25 ENCOUNTER — Encounter: Payer: Medicare Other | Attending: Physical Medicine & Rehabilitation | Admitting: Physical Medicine and Rehabilitation

## 2011-07-25 ENCOUNTER — Encounter: Payer: Self-pay | Admitting: Physical Medicine and Rehabilitation

## 2011-07-25 VITALS — BP 138/82 | HR 93 | Resp 18 | Ht 68.0 in | Wt 223.4 lb

## 2011-07-25 DIAGNOSIS — M4712 Other spondylosis with myelopathy, cervical region: Secondary | ICD-10-CM | POA: Insufficient documentation

## 2011-07-25 DIAGNOSIS — M5137 Other intervertebral disc degeneration, lumbosacral region: Secondary | ICD-10-CM

## 2011-07-25 DIAGNOSIS — M51369 Other intervertebral disc degeneration, lumbar region without mention of lumbar back pain or lower extremity pain: Secondary | ICD-10-CM

## 2011-07-25 DIAGNOSIS — M5136 Other intervertebral disc degeneration, lumbar region: Secondary | ICD-10-CM

## 2011-07-25 DIAGNOSIS — M47812 Spondylosis without myelopathy or radiculopathy, cervical region: Secondary | ICD-10-CM

## 2011-07-25 DIAGNOSIS — Z981 Arthrodesis status: Secondary | ICD-10-CM | POA: Insufficient documentation

## 2011-07-25 DIAGNOSIS — F17200 Nicotine dependence, unspecified, uncomplicated: Secondary | ICD-10-CM | POA: Insufficient documentation

## 2011-07-25 DIAGNOSIS — G8929 Other chronic pain: Secondary | ICD-10-CM | POA: Insufficient documentation

## 2011-07-25 DIAGNOSIS — M545 Low back pain, unspecified: Secondary | ICD-10-CM | POA: Insufficient documentation

## 2011-07-25 DIAGNOSIS — K219 Gastro-esophageal reflux disease without esophagitis: Secondary | ICD-10-CM | POA: Insufficient documentation

## 2011-07-25 DIAGNOSIS — M51379 Other intervertebral disc degeneration, lumbosacral region without mention of lumbar back pain or lower extremity pain: Secondary | ICD-10-CM

## 2011-07-25 DIAGNOSIS — M961 Postlaminectomy syndrome, not elsewhere classified: Secondary | ICD-10-CM

## 2011-07-25 DIAGNOSIS — M542 Cervicalgia: Secondary | ICD-10-CM

## 2011-07-25 DIAGNOSIS — J438 Other emphysema: Secondary | ICD-10-CM | POA: Insufficient documentation

## 2011-07-25 MED ORDER — DIAZEPAM 5 MG PO TABS: 5.0000 mg | ORAL_TABLET | Freq: Two times a day (BID) | ORAL | Status: DC

## 2011-07-25 MED ORDER — OXYCODONE-ACETAMINOPHEN 7.5-325 MG PO TABS: 1.0000 | ORAL_TABLET | Freq: Four times a day (QID) | ORAL | Status: DC

## 2011-07-25 NOTE — Patient Instructions (Signed)
Continue with PT, without lifting heavy weights.Continue trying to quit smoking completely.

## 2011-07-25 NOTE — Progress Notes (Signed)
Subjective:    Patient ID: Scott [name], male    DOB: 02-May-1960, 51 y.o.   MRN: [id number]  HPI The patient complains about chronic neck and low back pain which radiates into his shoulders bilateral.  The problem has been pretty stable, but the patient states that he has increased pain after physical therapy, and that he has taken a little more of his Percocet , because he had to take more tablets per day, to get pain relief. He also reports that his range of motion in his neck has improved since he is doing physical therapy.  Pain Inventory Average Pain 10 Pain Right Now 8 My pain is constant, sharp, burning, dull, stabbing and tingling  In the last 24 hours, has pain interfered with the following? General activity 10 Relation with others 10 Enjoyment of life 10 What TIME of day is your pain at its worst? all of the time Sleep (in general) Poor  Pain is worse with: walking, bending, sitting and standing Pain improves with: rest and medication Relief from Meds: 7  Mobility walk without assistance use a cane how many minutes can you walk? 5-7 ability to climb steps?  no do you drive?  no  Function not employed: date last employed  disabled: date disabled  I need assistance with the following:  dressing, bathing, meal prep, household duties and shopping  Neuro/Psych weakness numbness trouble walking spasms dizziness depression anxiety loss of taste or smell  Prior Studies Any changes since last visit?  no  Physicians involved in your care Any changes since last visit?  no   Family History  Problem Relation Age of Onset  . Emphysema Mother   . COPD Mother   . Asthma Brother   . Asthma Sister   . Heart disease Maternal Uncle    History   Social History  . Marital Status: Single    Spouse Name: N/A    Number of Children: N/A  . Years of Education: N/A   Occupational History  . DISABLED    Social History Main Topics  . Smoking status: Current  Everyday Smoker -- 0.3 packs/day  . Smokeless tobacco: Never Used   Comment: started smoking 13  . Alcohol Use: No  . Drug Use: No  . Sexually Active: None   Other Topics Concern  . None   Social History Narrative  . None   Past Surgical History  Procedure Date  . Tonsillectomy   . Other surgical history     surgery on cheekbone and head for fall 2000  . 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 Multiple tooth extractions   . Anterior cervical decomp/discectomy fusion 12/22/2010    Procedure: ANTERIOR CERVICAL DECOMPRESSION/DISCECTOMY FUSION 3 LEVELS;  Surgeon: Cristi Loron;  Location: [facility];  Service: Neurosurgery;  Laterality: N/A;  Anterior cervical discectomy with fusion cervical three-four, four-five, and five sixCDF with Interbody Prosthesis, plating, and Bone Graft    Past Medical History  Diagnosis Date  . COPD (chronic obstructive pulmonary disease)   . GERD (gastroesophageal reflux disease)   . Headache   . Arthritis     states MD told him he has arthritis in spine   BP 138/82  Pulse 93  Resp 18  Ht 5\' 8"  (1.727 m)  Wt 223 lb 6.4 oz (101.334 kg)  BMI 33.97 kg/m2  SpO2 91%     Review of Systems  Constitutional: Positive for diaphoresis and unexpected weight change.  Respiratory: Positive for cough, shortness of breath and wheezing.   Musculoskeletal: Positive for back pain and gait problem.  Neurological: Positive for dizziness, weakness and numbness.       Tingling  Psychiatric/Behavioral: Positive for dysphoric mood. The patient is nervous/anxious.   All other systems reviewed and are negative.       Objective:   Physical Exam Constitutional: He is oriented to person, place, and time. He appears well-developed and well-nourished.  HENT:  Head: Normocephalic.  Pulmonary/Chest: He is in respiratory distress.  Musculoskeletal: He exhibits  tenderness.  Neurological: He is alert and oriented to person, place, and time.  Skin: Skin is warm and dry.  Psychiatric: He has a normal mood and affect.   Symmetric normal motor tone is noted throughout. Normal muscle bulk. Muscle testing reveals 4/5 muscle strength of the upper extremity, and 4-/5 of the lower extremity. Full range of motion in upper and lower extremities. ROM of C-spine is severely restricted, but has improved since last visit.  DTR in the upper and lower extremity are present and symmetric 3+. No clonus is noted.  Patient arises from chair with some difficulty. Wide based gait with normal arm swing bilateral.          Assessment & Plan:  This is a 51 year old male with  1. Cervical spondylosis with myelopathy  2. Status post ACDF C3 to C6  3. 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 Neck pain  5. low back pain  6. Emphysema, COPD  Plan :  The patient states that he took a little more of his Percocet the because he had increased pain after physical therapy. We discussed that the patient should continue with his physical therapy. However, I advised the patient to talk to the physical therapist , or tell the physical therapist that he should call me when the exercises are too painful for the patient. I advised the patient to not take more of his medicine, than is prescribed to him. Refilled his Percocet, and also his Valium. Follow up in with PA in 1 month.

## 2011-08-01 ENCOUNTER — Ambulatory Visit: Payer: Medicare Other | Attending: Physical Medicine & Rehabilitation | Admitting: Physical Therapy

## 2011-08-01 DIAGNOSIS — M545 Low back pain, unspecified: Secondary | ICD-10-CM | POA: Insufficient documentation

## 2011-08-01 DIAGNOSIS — IMO0001 Reserved for inherently not codable concepts without codable children: Secondary | ICD-10-CM | POA: Insufficient documentation

## 2011-08-01 DIAGNOSIS — M542 Cervicalgia: Secondary | ICD-10-CM | POA: Insufficient documentation

## 2011-08-22 ENCOUNTER — Encounter
Payer: Medicare Other | Attending: Physical Medicine and Rehabilitation | Admitting: Physical Medicine and Rehabilitation

## 2011-08-22 ENCOUNTER — Encounter: Payer: Self-pay | Admitting: Physical Medicine and Rehabilitation

## 2011-08-22 VITALS — BP 153/97 | HR 89 | Resp 16 | Ht 68.0 in | Wt 223.0 lb

## 2011-08-22 DIAGNOSIS — M542 Cervicalgia: Secondary | ICD-10-CM | POA: Insufficient documentation

## 2011-08-22 DIAGNOSIS — M51379 Other intervertebral disc degeneration, lumbosacral region without mention of lumbar back pain or lower extremity pain: Secondary | ICD-10-CM | POA: Insufficient documentation

## 2011-08-22 DIAGNOSIS — M545 Low back pain, unspecified: Secondary | ICD-10-CM | POA: Insufficient documentation

## 2011-08-22 DIAGNOSIS — J438 Other emphysema: Secondary | ICD-10-CM | POA: Insufficient documentation

## 2011-08-22 DIAGNOSIS — F17200 Nicotine dependence, unspecified, uncomplicated: Secondary | ICD-10-CM | POA: Insufficient documentation

## 2011-08-22 DIAGNOSIS — M961 Postlaminectomy syndrome, not elsewhere classified: Secondary | ICD-10-CM

## 2011-08-22 DIAGNOSIS — K219 Gastro-esophageal reflux disease without esophagitis: Secondary | ICD-10-CM | POA: Insufficient documentation

## 2011-08-22 DIAGNOSIS — M5137 Other intervertebral disc degeneration, lumbosacral region: Secondary | ICD-10-CM | POA: Insufficient documentation

## 2011-08-22 DIAGNOSIS — Z981 Arthrodesis status: Secondary | ICD-10-CM | POA: Insufficient documentation

## 2011-08-22 DIAGNOSIS — M47812 Spondylosis without myelopathy or radiculopathy, cervical region: Secondary | ICD-10-CM

## 2011-08-22 DIAGNOSIS — M4712 Other spondylosis with myelopathy, cervical region: Secondary | ICD-10-CM | POA: Insufficient documentation

## 2011-08-22 DIAGNOSIS — G8929 Other chronic pain: Secondary | ICD-10-CM | POA: Insufficient documentation

## 2011-08-22 MED ORDER — DIAZEPAM 5 MG PO TABS: 5.0000 mg | ORAL_TABLET | Freq: Two times a day (BID) | ORAL | Status: DC

## 2011-08-22 MED ORDER — OXYCODONE-ACETAMINOPHEN 7.5-325 MG PO TABS: 1.0000 | ORAL_TABLET | Freq: Four times a day (QID) | ORAL | Status: DC

## 2011-08-22 NOTE — Patient Instructions (Signed)
Continue with the exercises you learned from PT, keep working on your ROM of your cervical.

## 2011-08-22 NOTE — Progress Notes (Signed)
Subjective:    Patient ID: Scott [name], male    DOB: [birth year], 52 y.o.   MRN: [id number]  HPI The patient complains about chronic neck and low back pain which radiates into his shoulders bilateral.  The problem has been pretty stable, but the patient states that he has increased pain after a recent move, he had to do by himself, and that he has taken a little more of his Percocet , to get pain relief. He was out one day early. He also reports that his range of motion in his neck has improved since he is doing physical therapy.  Pain Inventory Average Pain 9 Pain Right Now 8 My pain is sharp, burning, stabbing and tingling  In the last 24 hours, has pain interfered with the following? General activity 10 Relation with others 10 Enjoyment of life 10 What TIME of day is your pain at its worst? morning, evening, night Sleep (in general) Poor  Pain is worse with: walking, bending, sitting and standing Pain improves with: rest and medication Relief from Meds: 8  Mobility walk without assistance use a cane use a walker how many minutes can you walk? 5-7 ability to climb steps?  no do you drive?  yes use a wheelchair transfers alone Do you have any goals in this area?  no  Function disabled: date disabled 09-2009 I need assistance with the following:  dressing, bathing, meal prep, household duties and shopping Do you have any goals in this area?  no  Neuro/Psych No problems in this area  Prior Studies Any changes since last visit?  no  Physicians involved in your care Any changes since last visit?  no   Family History  Problem Relation Age of Onset  . Emphysema Mother   . COPD Mother   . Asthma Brother   . Asthma Sister   . Heart disease Maternal Uncle    History   Social History  . Marital Status: Single    Spouse Name: N/A    Number of Children: N/A  . Years of Education: N/A   Occupational History  . DISABLED    Social History Main Topics  . Smoking  status: Current Everyday Smoker -- 0.3 packs/day  . Smokeless tobacco: Never Used   Comment: started smoking 13  . Alcohol Use: No  . Drug Use: No  . Sexually Active: None   Other Topics Concern  . None   Social History Narrative  . None   Past Surgical History  Procedure Date  . Tonsillectomy   . Other surgical history     surgery on cheekbone and head for fall 2000  . 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 Multiple tooth extractions   . Anterior cervical decomp/discectomy fusion 12/22/2010    Procedure: ANTERIOR CERVICAL DECOMPRESSION/DISCECTOMY FUSION 3 LEVELS;  Surgeon: Cristi Loron;  Location: [facility];  Service: Neurosurgery;  Laterality: N/A;  Anterior cervical discectomy with fusion cervical three-four, four-five, and five sixCDF with Interbody Prosthesis, plating, and Bone Graft    Past Medical History  Diagnosis Date  . COPD (chronic obstructive pulmonary disease)   . GERD (gastroesophageal reflux disease)   . Headache   . Arthritis     states MD told him he has arthritis in spine   BP 153/97  Pulse 89  Resp 16  Ht 5'  8" (1.727 m)  Wt 223 lb (101.152 kg)  BMI 33.91 kg/m2  SpO2 94%     Review of Systems  HENT: Positive for neck pain.   All other systems reviewed and are negative.       Objective:   Physical Exam Constitutional: He is oriented to person, place, and time. He appears well-developed and well-nourished.  HENT:  Head: Normocephalic.  Pulmonary/Chest: He is in respiratory distress.  Musculoskeletal: He exhibits tenderness.  Neurological: He is alert and oriented to person, place, and time.  Skin: Skin is warm and dry.  Psychiatric: He has a normal mood and affect.  Symmetric normal motor tone is noted throughout. Normal muscle bulk. Muscle testing reveals 4/5 muscle strength of the upper extremity, and 4-/5 of the lower extremity.  Full range of motion in upper and lower extremities. ROM of C-spine is severely restricted, but has improved since last visit.  DTR in the upper and lower extremity are present and symmetric 3+. No clonus is noted.  Patient arises from chair with some difficulty. Wide based gait with normal arm swing bilateral.         Assessment & Plan:  This is a 51 year old male with  1. Cervical spondylosis with myelopathy  2. Status post ACDF C3 to C6  3. 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 Neck pain  5. low back pain  6. Emphysema, COPD  Plan :  The patient states that he took a little more of his Percocet the because he had increased pain after he had to move by himself. I advised the patient to not take more of his medicine, than is prescribed to him, I educated him, why this could be unsafe.Advised patient to continue with the exercises he learned from PT, at home, also advised patient to ask his insurance about the silver sneakers program, and whether they would support this.  Refilled his Percocet, and also his Valium.  Follow up in with PA in 1 month.

## 2011-09-12 ENCOUNTER — Encounter: Payer: Self-pay | Admitting: Emergency Medicine

## 2011-09-12 ENCOUNTER — Other Ambulatory Visit: Payer: Self-pay | Admitting: *Deleted

## 2011-09-12 ENCOUNTER — Ambulatory Visit (INDEPENDENT_AMBULATORY_CARE_PROVIDER_SITE_OTHER): Payer: Medicare Other | Admitting: Emergency Medicine

## 2011-09-12 ENCOUNTER — Telehealth: Payer: Self-pay | Admitting: Emergency Medicine

## 2011-09-12 VITALS — BP 104/80 | HR 94 | Temp 98.7°F | Ht 68.0 in | Wt 228.0 lb

## 2011-09-12 DIAGNOSIS — J309 Allergic rhinitis, unspecified: Secondary | ICD-10-CM

## 2011-09-12 DIAGNOSIS — J449 Chronic obstructive pulmonary disease, unspecified: Secondary | ICD-10-CM

## 2011-09-12 MED ORDER — ALBUTEROL SULFATE HFA 108 (90 BASE) MCG/ACT IN AERS: 2.0000 | INHALATION_SPRAY | RESPIRATORY_TRACT | Status: DC | PRN

## 2011-09-12 NOTE — Telephone Encounter (Signed)
The only new rx from today was ventolin, so I have resent to this to pharm since they did not receive.

## 2011-09-12 NOTE — Progress Notes (Signed)
  Subjective:    Patient ID: Scott [name], male    DOB: 1960-08-11, 51 y.o.   MRN: [id number]  HPI 51 yo man, tobacco user (smoking 1/2 pk/day), hx of COPD in a study at [facility]. Has had PFT at [facility] and ? At [facility] as well. Now following with Dr Reche Dixon at [facility]. He has been having increased combivent use for SOB. Coughs every day, has been worse since anterior cervical fusion in November. Sputum is clear to yellow.  He is also on Advair.  He has bad GERD, better w omeprazole. Has allergy sx, on no meds.   ROV 50/18/13 -- COPD, still smoking 5 cig a day. Tells me that his breathing is the same to worse. Taking Advair bid, combivent ordered qid but he uses prn. Has nicotine patches but not using. Discussed cigarette cessation in detail today. Talked about setting a quit date Tells me that he benefited from the loratadine and fluticasone but stopped taking.   ROV 50/20/13 -- COPD, still smoking 2-3 cig a day. Returns for f/u. Has been on combivent prn + Advair bid. Using proventil, not as beneficial as the ventolin. Remains on loratadine + fluticasone. Also remains on omeprazole. No AE since last visit, although he did take some spare prednisone that he had last month. No abx. He is interested in entering a clinical trial at [facility].      Objective:   Physical Exam Filed Vitals:   09/12/11 1330  BP: 104/80  Pulse: 94  Temp: 98.7 F (37.1 C)   Gen: Pleasant, well-nourished, in no distress,  anxious  ENT: No lesions,  mouth clear,  oropharynx clear, no postnasal drip,   Neck: No JVD, no TMG, no carotid bruits, insp stridor  Lungs: No use of accessory muscles, distant, clear without rales or rhonchi  Cardiovascular: RRR, heart sounds normal, no murmur or gallops, no peripheral edema  Musculoskeletal: No deformities, no cyanosis or clubbing  Neuro: alert, non focal  Skin: Warm, no lesions or rashes     Assessment & Plan:  COPD (chronic obstructive pulmonary disease) Same  BD's Will substitute ventolin for proventil since he prefers Discussed smoking cessation, he is working on weaning down. Doesn;t have a quit date yet rov 3 month  Allergic rhinitis Fluticasone + loratadine

## 2011-09-12 NOTE — Assessment & Plan Note (Signed)
Fluticasone + loratadine

## 2011-09-12 NOTE — Patient Instructions (Addendum)
Please continue your combivent 4 times a day Use Advair twice a day We will replace Proventil with Ventolin. Take 2 puffs up to every 4 hours if needed for shortness of breath.  Follow with Dr Delton Coombes in 3 months or sooner if you have any problems.

## 2011-09-12 NOTE — Assessment & Plan Note (Signed)
Same BD's Will substitute ventolin for proventil since he prefers Discussed smoking cessation, he is working on weaning down. Doesn;t have a quit date yet rov 3 month

## 2011-09-15 ENCOUNTER — Telehealth: Payer: Self-pay | Admitting: Emergency Medicine

## 2011-09-15 NOTE — Telephone Encounter (Signed)
Looks like the ventolin rx was sent to this pharmacy on 8/20.  Called and lmomtcb for the pharmacy to check to see if they did get this rx.

## 2011-09-15 NOTE — Telephone Encounter (Signed)
[name]. Say they did get rx - pt is taking proair. Scott [name]

## 2011-09-19 NOTE — Telephone Encounter (Signed)
lmomtcb x1 for pt 

## 2011-09-19 NOTE — Telephone Encounter (Signed)
Returning call can be reached at [phone].Scott [name]

## 2011-09-19 NOTE — Telephone Encounter (Signed)
Called [facility] to verify nothing further is needed.  Spoke with [place].  Was advised Proventil, not Proair, was received by pt yesterday.  No rx is needed at this time.    Called pt at # provided above - was advised he is not home right now but will call back.  Want to follow up with pt to make sure rx was received and nothing further needed.

## 2011-09-20 NOTE — Telephone Encounter (Signed)
lmomtcb x1 

## 2011-09-21 ENCOUNTER — Encounter
Payer: Medicare Other | Attending: Physical Medicine and Rehabilitation | Admitting: Physical Medicine and Rehabilitation

## 2011-09-21 ENCOUNTER — Other Ambulatory Visit: Payer: Self-pay | Admitting: Physical Medicine and Rehabilitation

## 2011-09-21 VITALS — BP 143/73 | HR 88 | Resp 16 | Ht 68.0 in | Wt 225.0 lb

## 2011-09-21 DIAGNOSIS — G8929 Other chronic pain: Secondary | ICD-10-CM | POA: Insufficient documentation

## 2011-09-21 DIAGNOSIS — M51379 Other intervertebral disc degeneration, lumbosacral region without mention of lumbar back pain or lower extremity pain: Secondary | ICD-10-CM | POA: Insufficient documentation

## 2011-09-21 DIAGNOSIS — M47816 Spondylosis without myelopathy or radiculopathy, lumbar region: Secondary | ICD-10-CM

## 2011-09-21 DIAGNOSIS — M542 Cervicalgia: Secondary | ICD-10-CM

## 2011-09-21 DIAGNOSIS — J438 Other emphysema: Secondary | ICD-10-CM | POA: Insufficient documentation

## 2011-09-21 DIAGNOSIS — M47812 Spondylosis without myelopathy or radiculopathy, cervical region: Secondary | ICD-10-CM | POA: Insufficient documentation

## 2011-09-21 DIAGNOSIS — M549 Dorsalgia, unspecified: Secondary | ICD-10-CM

## 2011-09-21 DIAGNOSIS — Z981 Arthrodesis status: Secondary | ICD-10-CM | POA: Insufficient documentation

## 2011-09-21 DIAGNOSIS — F17200 Nicotine dependence, unspecified, uncomplicated: Secondary | ICD-10-CM | POA: Insufficient documentation

## 2011-09-21 DIAGNOSIS — M545 Low back pain, unspecified: Secondary | ICD-10-CM | POA: Insufficient documentation

## 2011-09-21 DIAGNOSIS — M47817 Spondylosis without myelopathy or radiculopathy, lumbosacral region: Secondary | ICD-10-CM

## 2011-09-21 DIAGNOSIS — M5137 Other intervertebral disc degeneration, lumbosacral region: Secondary | ICD-10-CM | POA: Insufficient documentation

## 2011-09-21 DIAGNOSIS — K219 Gastro-esophageal reflux disease without esophagitis: Secondary | ICD-10-CM | POA: Insufficient documentation

## 2011-09-21 MED ORDER — DIAZEPAM 5 MG PO TABS: 5.0000 mg | ORAL_TABLET | Freq: Two times a day (BID) | ORAL | Status: DC

## 2011-09-21 MED ORDER — OXYCODONE-ACETAMINOPHEN 7.5-325 MG PO TABS: 1.0000 | ORAL_TABLET | Freq: Four times a day (QID) | ORAL | Status: DC

## 2011-09-21 NOTE — Progress Notes (Signed)
Subjective:    Patient ID: Scott [name], male    DOB: [birth year], 51 y.o.   MRN: [id number]  HPI The patient complains about chronic neck and low back pain which radiates into his shoulders bilateral.  The problem has been pretty stable. He also reports that his range of motion in his neck has improved since he is doing physical therapy.He reports that his pulmonologist advised him to walk more.  Pain Inventory Average Pain 10 Pain Right Now 8 My pain is sharp, dull, stabbing, tingling and aching  In the last 24 hours, has pain interfered with the following? General activity 10 Relation with others 10 Enjoyment of life 10 What TIME of day is your pain at its worst? All Day Sleep (in general) Poor  Pain is worse with: walking, bending, sitting and standing Pain improves with: rest, therapy/exercise and medication Relief from Meds: 9  Mobility walk without assistance use a cane ability to climb steps?  no do you drive?  no use a wheelchair  Function disabled: date disabled 2011 I need assistance with the following:  dressing, bathing, meal prep, household duties and shopping  Neuro/Psych weakness numbness tingling trouble walking dizziness anxiety loss of taste or smell  Prior Studies Any changes since last visit?  no  Physicians involved in your care Any changes since last visit?  no   Family History  Problem Relation Age of Onset  . Emphysema Mother   . COPD Mother   . Asthma Brother   . Asthma Sister   . Heart disease Maternal Uncle    History   Social History  . Marital Status: Single    Spouse Name: N/A    Number of Children: N/A  . Years of Education: N/A   Occupational History  . DISABLED    Social History Main Topics  . Smoking status: Current Everyday Smoker -- 0.3 packs/day  . Smokeless tobacco: Never Used   Comment: started smoking 12--2-3 cigs per day  . Alcohol Use: No  . Drug Use: No  . Sexually Active: Not on file   Other  Topics Concern  . Not on file   Social History Narrative  . No narrative on file   Past Surgical History  Procedure Date  . Tonsillectomy   . Other surgical history     surgery on cheekbone and head for fall 2000  . 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 Multiple tooth extractions   . Anterior cervical decomp/discectomy fusion 12/22/2010    Procedure: ANTERIOR CERVICAL DECOMPRESSION/DISCECTOMY FUSION 3 LEVELS;  Surgeon: Cristi Loron;  Location: [facility];  Service: Neurosurgery;  Laterality: N/A;  Anterior cervical discectomy with fusion cervical three-four, four-five, and five sixCDF with Interbody Prosthesis, plating, and Bone Graft    Past Medical History  Diagnosis Date  . COPD (chronic obstructive pulmonary disease)   . GERD (gastroesophageal reflux disease)   . Headache   . Arthritis     states MD told him he has arthritis in spine   BP 143/73  Pulse 88  Resp 16  Ht 5\' 8"  (1.727 m)  Wt 225 lb (102.059 kg)  BMI 34.21 kg/m2  SpO2 92%      Review of Systems  Constitutional: Positive for unexpected weight change.  HENT: Positive for neck pain.   Eyes: Negative.   Respiratory: Positive for  cough, shortness of breath and wheezing.   Cardiovascular: Negative.   Gastrointestinal: Negative.   Genitourinary: Negative.   Musculoskeletal: Positive for back pain and gait problem.  Skin: Negative.   Neurological: Positive for dizziness, weakness and numbness.  Hematological: Negative.   Psychiatric/Behavioral: Negative.        Objective:   Physical Exam Constitutional: He is oriented to person, place, and time. He appears well-developed and well-nourished.  HENT:  Head: Normocephalic.  Pulmonary/Chest: He is in respiratory distress.  Musculoskeletal: He exhibits tenderness.  Neurological: He is alert and oriented to person, place, and time.  Skin: Skin is  warm and dry.  Psychiatric: He has a normal mood and affect.  Symmetric normal motor tone is noted throughout. Normal muscle bulk. Muscle testing reveals 4/5 muscle strength of the upper extremity, and 4-/5 of the lower extremity. Full range of motion in upper and lower extremities. ROM of C-spine is severely restricted, but has improved since last visit.  DTR in the upper and lower extremity are present and symmetric 3+. No clonus is noted.  Patient arises from chair with some difficulty. Wide based gait with normal arm swing bilateral.         Assessment & Plan:  This is a 51 year old male with  1. Cervical spondylosis with myelopathy  2. Status post ACDF C3 to C6  3. 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 Neck pain  5. low back pain  6. Emphysema, COPD  Plan :  Prescribed Lumbar brace, to support his back when he walks and is active in and around his home. His pulmonologist also advised the patient to walk more, to improve his breathing, but because of his back pain he is not able to walk for a prolonged time, with the brace he most likely will be able to walk longer distances, which would be beneficial for his low back pain and his COPD. Advised patient to continue with the exercises he learned from PT, at home, consider PT at next visit, since he has medicare now. Refilled his Percocet, and also his Valium.  Follow up in with PA in 1 month.

## 2011-09-21 NOTE — Telephone Encounter (Signed)
I spoke with pt spouse and she states pt did receive his medications. Nothing further needed at this time. Carron Curie, CMA

## 2011-09-21 NOTE — Patient Instructions (Signed)
Continue with the exercises you learned in PT.

## 2011-10-03 ENCOUNTER — Encounter: Payer: Self-pay | Admitting: Physical Medicine and Rehabilitation

## 2011-10-12 ENCOUNTER — Telehealth: Payer: Self-pay | Admitting: Emergency Medicine

## 2011-10-12 MED ORDER — IPRATROPIUM-ALBUTEROL 18-103 MCG/ACT IN AERO: 2.0000 | INHALATION_SPRAY | Freq: Four times a day (QID) | RESPIRATORY_TRACT | Status: DC

## 2011-10-12 NOTE — Telephone Encounter (Signed)
Rx for combivent was sent

## 2011-10-13 ENCOUNTER — Encounter (HOSPITAL_COMMUNITY): Payer: Self-pay

## 2011-10-13 ENCOUNTER — Emergency Department (INDEPENDENT_AMBULATORY_CARE_PROVIDER_SITE_OTHER): Payer: Medicaid Other

## 2011-10-13 ENCOUNTER — Emergency Department (INDEPENDENT_AMBULATORY_CARE_PROVIDER_SITE_OTHER)
Admission: EM | Admit: 2011-10-13 | Discharge: 2011-10-13 | Disposition: A | Discharge status: Home / Self Care | Payer: Medicare Other | Source: Home / Self Care | Attending: Emergency Medicine | Admitting: Emergency Medicine

## 2011-10-13 ENCOUNTER — Telehealth: Payer: Self-pay | Admitting: Emergency Medicine

## 2011-10-13 DIAGNOSIS — M549 Dorsalgia, unspecified: Secondary | ICD-10-CM

## 2011-10-13 DIAGNOSIS — G8929 Other chronic pain: Secondary | ICD-10-CM

## 2011-10-13 DIAGNOSIS — W19XXXA Unspecified fall, initial encounter: Secondary | ICD-10-CM

## 2011-10-13 DIAGNOSIS — M542 Cervicalgia: Secondary | ICD-10-CM

## 2011-10-13 MED ORDER — FLUTICASONE-SALMETEROL 250-50 MCG/DOSE IN AEPB: 1.0000 | INHALATION_SPRAY | Freq: Two times a day (BID) | RESPIRATORY_TRACT | Status: DC

## 2011-10-13 MED ORDER — KETOROLAC TROMETHAMINE 30 MG/ML IJ SOLN
INTRAMUSCULAR | Status: AC
  Filled 2011-10-13: qty 1

## 2011-10-13 MED ORDER — KETOROLAC TROMETHAMINE 30 MG/ML IJ SOLN
30.0000 mg | Freq: Once | INTRAMUSCULAR | Status: AC
  Administered 2011-10-13: 30 mg via INTRAMUSCULAR

## 2011-10-13 MED ORDER — IPRATROPIUM-ALBUTEROL 20-100 MCG/ACT IN AERS: 1.0000 | INHALATION_SPRAY | Freq: Four times a day (QID) | RESPIRATORY_TRACT | Status: DC | PRN

## 2011-10-13 MED ORDER — KETOROLAC TROMETHAMINE 10 MG PO TABS: 10.0000 mg | ORAL_TABLET | Freq: Four times a day (QID) | ORAL | Status: DC | PRN

## 2011-10-13 NOTE — ED Notes (Signed)
States he fell 2 days ago , he fell from porch , and since then he has been having pain in hands, numbness in fingers , pain in legs and numbness in fingers. History of multiple spinal issues, in pain management, and reportedly has sufficient medication to last until his next appt, but c/o breakthrough pain before his next doses are due of valium and percocet

## 2011-10-13 NOTE — Telephone Encounter (Signed)
combivent has been d/c'd. It is now [facility]. I have sent new rx's in for pt.

## 2011-10-13 NOTE — ED Provider Notes (Signed)
History     CSN: [id number]  Arrival date & time 10/13/11  [id number]   First MD Initiated Contact with Patient 10/13/11 [phone]      Chief Complaint  Patient presents with  . Fall    (Consider location/radiation/quality/duration/timing/severity/associated sxs/prior treatment) HPI Comments: You have patient presents urgent care this morning complaining of lower back pain and interscapular pain. He describes a 2 days he fell on his porch landing face forward after mis-stepping 2 steps. Describes sharp pain in his lower back radiates towards both of his legs also with numbness of both of his feet he reports. Reports some degree of weakness to his right lower extremity. Patient denies any saddle pattern numbness or tingling sensation no new lead develop urinary symptoms or incontinence. Nor any bowel movement or fecal incontinence. Patient also denies any constitutional symptoms such as fevers, generalized malaise, arthralgias myalgias, and unintentional weight loss. No abdominal pain  . Patient is under a pain management contract currently taking Percocets every 6 hours. He have not had any interaction with his pain management clinic as of yet. Patient underwent surgery for a cervical nerve compression in November 2012.  Patient is a 51 y.o. male presenting with fall. The history is provided by the patient.  Fall He fell from a height of 1 to 2 ft. There was no blood loss. The pain is at a severity of 10/10. The pain is moderate. He was ambulatory at the scene. There was entrapment after the fall. There was no drug use involved in the accident. Associated symptoms include visual change and numbness. Pertinent negatives include no fever, no abdominal pain, no bowel incontinence, no nausea, no vomiting, no headaches, no hearing loss and no tingling. The symptoms are aggravated by activity, ambulation, extension, sitting, standing and flexion. The treatment provided no relief.    Past Medical History    Diagnosis Date  . COPD (chronic obstructive pulmonary disease)   . GERD (gastroesophageal reflux disease)   . Headache   . 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 Tonsillectomy   . Other surgical history     surgery on cheekbone and head for fall 2000  . 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 Multiple tooth extractions   . Anterior cervical decomp/discectomy fusion 12/22/2010    Procedure: ANTERIOR CERVICAL DECOMPRESSION/DISCECTOMY FUSION 3 LEVELS;  Surgeon: Cristi Loron;  Location: [facility];  Service: Neurosurgery;  Laterality: N/A;  Anterior cervical discectomy with fusion cervical three-four, four-five, and five sixCDF with Interbody Prosthesis, plating, and Bone Graft     Family History  Problem Relation Age of Onset  . Emphysema Mother   . COPD Mother   . Asthma Brother   . Asthma Sister   . Heart disease Maternal Uncle     History  Substance Use Topics  . Smoking status: Current Every Day Smoker -- 0.3 packs/day  . Smokeless tobacco: Never Used   Comment: started smoking 12--2-3 cigs per day  . Alcohol Use: No      Review of Systems  Constitutional: Positive for activity change. Negative for fever, chills and appetite change.  Respiratory: Negative for cough and shortness of breath.   Gastrointestinal: Positive for constipation. Negative for nausea, vomiting, abdominal pain, rectal pain  and bowel incontinence.  Musculoskeletal: Positive for back pain.  Neurological: Positive for weakness and numbness. Negative for tingling and headaches.    Allergies  Penicillins  Home Medications   Current Outpatient Rx  Name Route Sig Dispense Refill  . ALBUTEROL SULFATE HFA 108 (90 BASE) MCG/ACT IN AERS Inhalation Inhale 2 puffs into the lungs every 4 (four) hours as needed. 1 Inhaler 6  . DIAZEPAM  5 MG PO TABS Oral Take 1 tablet (5 mg total) by mouth 2 (two) times daily. 60 tablet 0  . FENOFIBRATE 48 MG PO TABS Oral Take 48 mg by mouth daily.    Marland Kitchen FLUTICASONE PROPIONATE 50 MCG/ACT NA SUSP Nasal Place 2 sprays into the nose 2 (two) times daily. 16 g 12  . FLUTICASONE-SALMETEROL 250-50 MCG/DOSE IN AEPB Inhalation Inhale 1 puff into the lungs every 12 (twelve) hours. 60 each 6  . IPRATROPIUM-ALBUTEROL 20-100 MCG/ACT IN AERS Inhalation Inhale 1 puff into the lungs 4 (four) times daily as needed. 1 Inhaler 6  . IPRATROPIUM-ALBUTEROL 0.5-2.5 (3) MG/3ML IN SOLN Nebulization Take 3 mLs by nebulization 4 (four) times daily.     Marland Kitchen KETOROLAC TROMETHAMINE 10 MG PO TABS Oral Take 1 tablet (10 mg total) by mouth every 6 (six) hours as needed for pain. 15 tablet 0  . LORATADINE 10 MG PO TABS Oral Take 1 tablet (10 mg total) by mouth daily. 30 tablet 11  . NICOTINE 7 MG/24HR TD PT24 Transdermal Place 1 patch onto the skin daily.      Marland Kitchen OMEPRAZOLE 40 MG PO CPDR Oral Take 40 mg by mouth daily.      . OXYCODONE-ACETAMINOPHEN 7.5-325 MG PO TABS Oral Take 1 tablet by mouth 4 (four) times daily. Plus half a tab at bedtime 120 tablet 0  . NYQUIL PO Oral Take 5 mLs by mouth at bedtime. For sleep/congestion.    Marland Kitchen ROSUVASTATIN CALCIUM 10 MG PO TABS Oral Take 10 mg by mouth daily.      BP 112/64  Pulse 79  Temp 98.2 F (36.8 C) (Oral)  Resp 18  SpO2 96%  Physical Exam  Nursing note and vitals reviewed. Constitutional: Vital signs are normal.  Non-toxic appearance. He does not have a sickly appearance. He does not appear ill. No distress.  Neck: No JVD present. Muscular tenderness present. No spinous process tenderness present. Decreased range of motion present.  Musculoskeletal:       Cervical back: He exhibits decreased range of motion, tenderness and pain. He exhibits no swelling, no edema and no deformity.       Back:  Neurological: He is alert. No cranial nerve deficit. He exhibits normal muscle tone.  Coordination normal.  Skin: Skin is warm. No rash noted. No erythema. No pallor.    ED Course  Procedures (including critical care time)  Labs Reviewed - No data to display Dg Lumbar Spine Complete  10/13/2011  *RADIOLOGY REPORT*  Clinical Data: Low back pain following a fall.  LUMBAR SPINE - COMPLETE 4+ VIEW  Comparison: 06/23/2010 and MR dated 07/29/2010.  Findings: Five non-rib bearing lumbar vertebrae.  No significant change in mild anterior spur formation at multiple levels.  No fractures, pars defects or subluxations.  Small amount of atheromatous arterial calcification.  IMPRESSION:  1.  No fracture or subluxation. 2.  Mild degenerative changes.   Original Report Authenticated By: Darrol Angel, M.D.      1. Exacerbation of chronic back pain   2. Musculoskeletal neck pain  3. Fall       MDM    patient today reports a recent fall. His exam was somewhat unchanged in comparison with previous visits and chart review. No acute neuromuscular deficits were noted on exam. Patient was provided with Toradol IM injection. We have called his pain management provider, as patient is on chronic narcotics to further discuss with patient if any adjustments will be necessary. Patient has appointment on September 27. X-rays did not demonstrate any acute lumbar fracture or dislocation.       Jimmie Molly, MD 10/13/11 [phone]

## 2011-10-17 ENCOUNTER — Telehealth: Payer: Self-pay | Admitting: Emergency Medicine

## 2011-10-17 NOTE — Telephone Encounter (Signed)
Called and spoke with patient, patient states that the COPD study at [facility] called him yesterday and he will start back with the study  on Oct 2,2013 at  [facility]  Patient uses [facility] and states that in order for the transportation services to take him to [facility] for study he needs approval from MD that transportation is for medical therapy.Approval needs to be given in advancement of at least 3 days  [phone]--[place] [employer]-- I called and spoke with [facility] and an [place] of [place] form needs to be completed.  They are faxing me the form, she stated is has a few "basic" questions.  I asked her if a signature is required or would a stamp be appropriate and she states stamp is fine.  When form is faxed if this is ok with you for the approval may I complete it and send back in so that it is received in appropriate time.   Please advise, thank you

## 2011-10-19 NOTE — Telephone Encounter (Signed)
Form signed.

## 2011-10-20 ENCOUNTER — Encounter
Payer: Medicare Other | Attending: Physical Medicine and Rehabilitation | Admitting: Physical Medicine and Rehabilitation

## 2011-10-20 ENCOUNTER — Encounter: Payer: Self-pay | Admitting: Physical Medicine and Rehabilitation

## 2011-10-20 VITALS — BP 128/74 | HR 80 | Resp 16 | Ht 70.0 in | Wt 225.0 lb

## 2011-10-20 DIAGNOSIS — M961 Postlaminectomy syndrome, not elsewhere classified: Secondary | ICD-10-CM

## 2011-10-20 DIAGNOSIS — K219 Gastro-esophageal reflux disease without esophagitis: Secondary | ICD-10-CM | POA: Insufficient documentation

## 2011-10-20 DIAGNOSIS — M545 Low back pain, unspecified: Secondary | ICD-10-CM | POA: Insufficient documentation

## 2011-10-20 DIAGNOSIS — M47817 Spondylosis without myelopathy or radiculopathy, lumbosacral region: Secondary | ICD-10-CM

## 2011-10-20 DIAGNOSIS — M542 Cervicalgia: Secondary | ICD-10-CM | POA: Insufficient documentation

## 2011-10-20 DIAGNOSIS — G8929 Other chronic pain: Secondary | ICD-10-CM | POA: Insufficient documentation

## 2011-10-20 DIAGNOSIS — M5137 Other intervertebral disc degeneration, lumbosacral region: Secondary | ICD-10-CM | POA: Insufficient documentation

## 2011-10-20 DIAGNOSIS — M4712 Other spondylosis with myelopathy, cervical region: Secondary | ICD-10-CM | POA: Insufficient documentation

## 2011-10-20 DIAGNOSIS — M47816 Spondylosis without myelopathy or radiculopathy, lumbar region: Secondary | ICD-10-CM

## 2011-10-20 DIAGNOSIS — F17200 Nicotine dependence, unspecified, uncomplicated: Secondary | ICD-10-CM | POA: Insufficient documentation

## 2011-10-20 DIAGNOSIS — M51379 Other intervertebral disc degeneration, lumbosacral region without mention of lumbar back pain or lower extremity pain: Secondary | ICD-10-CM | POA: Insufficient documentation

## 2011-10-20 DIAGNOSIS — J438 Other emphysema: Secondary | ICD-10-CM | POA: Insufficient documentation

## 2011-10-20 DIAGNOSIS — Z981 Arthrodesis status: Secondary | ICD-10-CM | POA: Insufficient documentation

## 2011-10-20 MED ORDER — OXYCODONE-ACETAMINOPHEN 7.5-325 MG PO TABS: 1.0000 | ORAL_TABLET | Freq: Four times a day (QID) | ORAL | Status: DC

## 2011-10-20 MED ORDER — DIAZEPAM 5 MG PO TABS: 5.0000 mg | ORAL_TABLET | Freq: Three times a day (TID) | ORAL | Status: DC

## 2011-10-20 NOTE — Patient Instructions (Signed)
Continue with your exercises and walking. 

## 2011-10-20 NOTE — Progress Notes (Signed)
Subjective:    Patient ID: Scott [name], male    DOB: November 04, 1960, 51 y.o.   MRN: [id number]  HPI The patient complains about chronic neck and low back pain which radiates into his shoulders bilateral.  The problem has been pretty stable. He also reports that his range of motion in his neck has improved since he is doing physical therapy.He reports that his pulmonologist advised him to walk more.The patient reports, that he had a fall on 10/13/2011. He went to the ED, X-rays were without significant findings. Since then he has exacerbation of low back pain and increased muscle spasms.  Pain Inventory Average Pain 10 Pain Right Now 9 My pain is sharp, burning, stabbing and tingling  In the last 24 hours, has pain interfered with the following? General activity 10 Relation with others 10 Enjoyment of life 10 What TIME of day is your pain at its worst? All Day Sleep (in general) Poor  Pain is worse with: walking, bending, sitting and standing Pain improves with: rest and medication Relief from Meds: 6  Mobility walk without assistance use a cane ability to climb steps?  no do you drive?  no  Function disabled: date disabled 2011 I need assistance with the following:  dressing, bathing, meal prep, household duties and shopping  Neuro/Psych weakness numbness tingling trouble walking anxiety loss of taste or smell  Prior Studies Any changes since last visit?  no  Physicians involved in your care Any changes since last visit?  no   Family History  Problem Relation Age of Onset  . Emphysema Mother   . COPD Mother   . Asthma Brother   . Asthma Sister   . Heart disease Maternal Uncle    History   Social History  . Marital Status: Single    Spouse Name: N/A    Number of Children: N/A  . Years of Education: N/A   Occupational History  . DISABLED    Social History Main Topics  . Smoking status: Current Every Day Smoker -- 0.3 packs/day  . Smokeless tobacco:  Never Used   Comment: started smoking 12--2-3 cigs per day  . Alcohol Use: No  . Drug Use: No  . Sexually Active: None   Other Topics Concern  . None   Social History Narrative  . None   Past Surgical History  Procedure Date  . Tonsillectomy   . Other surgical history     surgery on cheekbone and head for fall 2000  . 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 Multiple tooth extractions   . Anterior cervical decomp/discectomy fusion 12/22/2010    Procedure: ANTERIOR CERVICAL DECOMPRESSION/DISCECTOMY FUSION 3 LEVELS;  Surgeon: Cristi Loron;  Location: [facility];  Service: Neurosurgery;  Laterality: N/A;  Anterior cervical discectomy with fusion cervical three-four, four-five, and five sixCDF with Interbody Prosthesis, plating, and Bone Graft    Past Medical History  Diagnosis Date  . COPD (chronic obstructive pulmonary disease)   . GERD (gastroesophageal reflux disease)   . Headache   . 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   Eyes: Negative.   Respiratory: Negative.   Cardiovascular: Negative.   Gastrointestinal: Negative.   Genitourinary: Negative.   Musculoskeletal: Positive for back pain and gait problem.  Skin: Negative.   Neurological: Positive for weakness and numbness.  Hematological: Negative.   Psychiatric/Behavioral: Negative.        Objective:   Physical Exam  Constitutional: He is oriented to person, place, and time. He appears well-developed and well-nourished.  HENT:  Head: Normocephalic.  Pulmonary/Chest: He is in respiratory distress.  Musculoskeletal: He exhibits tenderness. Low back, paraspinal bilateral. Neurological: He is alert and oriented to person, place, and time.   Skin: Skin is warm and dry.  Psychiatric: He has a normal mood and affect.  Symmetric normal motor tone is noted throughout. Normal muscle bulk. Muscle testing reveals 4/5 muscle strength of the upper extremity, and 4-/5 of the lower extremity. Full range of motion in upper and lower extremities. ROM of C-spine is severely restricted, but has improved since last visit.  DTR in the upper and lower extremity are present and symmetric 3+. No clonus is noted.  Patient arises from chair with some difficulty. Wide based gait with normal arm swing bilateral.        Assessment & Plan:  This is a 51 year old male with  1. Cervical spondylosis with myelopathy  2. Status post ACDF C3 to C6  3. 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 Neck pain  5. low back pain, exacerbation s/p fall  6. Emphysema, COPD  Plan :  Prescribed Lumbar brace, at last visit, to support his back when he walks and is active in and around his home. His pulmonologist also advised the patient to walk more, to improve his breathing, but because of his back pain he is not able to walk for a prolonged time, with the brace he most likely will be able to walk longer distances, which would be beneficial for his low back pain and his COPD. The patient reports, that this has really given him relief. Advised patient to continue with the exercises he learned from PT. Ordered  PT mainly for pain relief with modalities. Consider MRI of L-spine if no improvement.  Refilled his Percocet, and also his Valium. Increased his Valium from 5mg  bid to tid, for this month for relief of muscle spasms.  Follow up in with PA in 1 month.

## 2011-10-23 NOTE — Telephone Encounter (Signed)
Paper faxed today to [phone]. Patient called and updated of this status, verbalized understanding of this and nothing further needed at this time.

## 2011-11-01 ENCOUNTER — Ambulatory Visit: Payer: Medicare Other | Attending: Physical Medicine & Rehabilitation

## 2011-11-01 DIAGNOSIS — M545 Low back pain, unspecified: Secondary | ICD-10-CM | POA: Insufficient documentation

## 2011-11-01 DIAGNOSIS — IMO0001 Reserved for inherently not codable concepts without codable children: Secondary | ICD-10-CM | POA: Insufficient documentation

## 2011-11-01 DIAGNOSIS — M542 Cervicalgia: Secondary | ICD-10-CM | POA: Insufficient documentation

## 2011-11-14 ENCOUNTER — Ambulatory Visit: Payer: Medicare Other | Admitting: Physical Medicine and Rehabilitation

## 2011-11-20 ENCOUNTER — Encounter
Payer: Medicare Other | Attending: Physical Medicine and Rehabilitation | Admitting: Physical Medicine and Rehabilitation

## 2011-11-20 ENCOUNTER — Encounter: Payer: Self-pay | Admitting: Physical Medicine and Rehabilitation

## 2011-11-20 VITALS — BP 122/89 | HR 102 | Resp 16 | Ht 68.0 in | Wt 224.0 lb

## 2011-11-20 DIAGNOSIS — G8929 Other chronic pain: Secondary | ICD-10-CM | POA: Insufficient documentation

## 2011-11-20 DIAGNOSIS — M5136 Other intervertebral disc degeneration, lumbar region: Secondary | ICD-10-CM

## 2011-11-20 DIAGNOSIS — Z981 Arthrodesis status: Secondary | ICD-10-CM | POA: Insufficient documentation

## 2011-11-20 DIAGNOSIS — M4322 Fusion of spine, cervical region: Secondary | ICD-10-CM

## 2011-11-20 DIAGNOSIS — M545 Low back pain, unspecified: Secondary | ICD-10-CM | POA: Insufficient documentation

## 2011-11-20 DIAGNOSIS — F17200 Nicotine dependence, unspecified, uncomplicated: Secondary | ICD-10-CM | POA: Insufficient documentation

## 2011-11-20 DIAGNOSIS — M542 Cervicalgia: Secondary | ICD-10-CM

## 2011-11-20 DIAGNOSIS — J438 Other emphysema: Secondary | ICD-10-CM | POA: Insufficient documentation

## 2011-11-20 DIAGNOSIS — M5137 Other intervertebral disc degeneration, lumbosacral region: Secondary | ICD-10-CM

## 2011-11-20 DIAGNOSIS — K219 Gastro-esophageal reflux disease without esophagitis: Secondary | ICD-10-CM | POA: Insufficient documentation

## 2011-11-20 DIAGNOSIS — M51379 Other intervertebral disc degeneration, lumbosacral region without mention of lumbar back pain or lower extremity pain: Secondary | ICD-10-CM | POA: Insufficient documentation

## 2011-11-20 DIAGNOSIS — M4712 Other spondylosis with myelopathy, cervical region: Secondary | ICD-10-CM | POA: Insufficient documentation

## 2011-11-20 DIAGNOSIS — Q7649 Other congenital malformations of spine, not associated with scoliosis: Secondary | ICD-10-CM

## 2011-11-20 MED ORDER — OXYCODONE-ACETAMINOPHEN 7.5-325 MG PO TABS: 1.0000 | ORAL_TABLET | Freq: Four times a day (QID) | ORAL | Status: DC

## 2011-11-20 MED ORDER — DIAZEPAM 5 MG PO TABS: 5.0000 mg | ORAL_TABLET | Freq: Three times a day (TID) | ORAL | Status: DC

## 2011-11-20 NOTE — Patient Instructions (Signed)
Try to cut down with your smoking.Try to stay as active as tolerated.

## 2011-11-20 NOTE — Progress Notes (Signed)
Subjective:    Patient ID: Scott [name], male    DOB: October 10, 1960, 51 y.o.   MRN: [id number]  HPI The patient complains about chronic neck and low back pain which radiates into his shoulders bilateral.  The problem has been pretty stable. He also reports that his range of motion in his neck has improved since he is doing physical therapy.He reports that his pulmonologist advised him to walk more.The patient reports, that he had a fall on 10/13/2011. He went to the ED, X-rays were without significant findings. Since then he had exacerbation of low back pain, today his back pain is back to baseline . He reports, that he had a death in the family, and is a little stressed out, and is unfortunately smoking more again. He states, that he has an appointment with his pulmonologist next month, and wants to discuss this, and wants to get more help to be able to quitt his smoking.  Pain Inventory Average Pain 10 Pain Right Now 7 My pain is sharp, burning, stabbing and tingling  In the last 24 hours, has pain interfered with the following? General activity 10 Relation with others 10 Enjoyment of life 10 What TIME of day is your pain at its worst? all the time Sleep (in general) Poor  Pain is worse with: walking, bending, sitting and standing Pain improves with: rest and medication Relief from Meds: 8  Mobility walk without assistance use a cane how many minutes can you walk? 5-7 ability to climb steps?  no do you drive?  no use a wheelchair Do you have any goals in this area?  no  Function disabled: date disabled 11/11 I need assistance with the following:  dressing, bathing, meal prep, household duties and shopping Do you have any goals in this area?  no  Neuro/Psych weakness numbness tremor tingling dizziness depression anxiety loss of taste or smell  Prior Studies Any changes since last visit?  no  Physicians involved in your care Any changes since last visit?   no   Family History  Problem Relation Age of Onset  . Emphysema Mother   . COPD Mother   . Asthma Brother   . Asthma Sister   . Heart disease Maternal Uncle    History   Social History  . Marital Status: Single    Spouse Name: N/A    Number of Children: N/A  . Years of Education: N/A   Occupational History  . DISABLED    Social History Main Topics  . Smoking status: Current Every Day Smoker -- 0.3 packs/day  . Smokeless tobacco: Never Used   Comment: started smoking 12--2-3 cigs per day  . Alcohol Use: No  . Drug Use: No  . Sexually Active: None   Other Topics Concern  . None   Social History Narrative  . None   Past Surgical History  Procedure Date  . Tonsillectomy   . Other surgical history     surgery on cheekbone and head for fall 2000  . 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 Multiple tooth extractions   . Anterior cervical decomp/discectomy fusion 12/22/2010    Procedure: ANTERIOR CERVICAL DECOMPRESSION/DISCECTOMY FUSION 3 LEVELS;  Surgeon: Cristi Loron;  Location: [facility];  Service: Neurosurgery;  Laterality: N/A;  Anterior cervical discectomy with fusion cervical three-four, four-five, and five sixCDF with  Interbody Prosthesis, plating, and Bone Graft    Past Medical History  Diagnosis Date  . COPD (chronic obstructive pulmonary disease)   . GERD (gastroesophageal reflux disease)   . Headache   . 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  HENT: Positive for neck pain.   Respiratory: Positive for apnea, shortness of breath and wheezing.   Musculoskeletal: Positive for back pain.  Neurological: Positive for weakness and numbness.   Psychiatric/Behavioral: Positive for dysphoric mood. The patient is nervous/anxious.   All other systems reviewed and are negative.       Objective:   Physical Exam Constitutional: He is oriented to person, place, and time. He appears well-developed and well-nourished.  HENT:  Head: Normocephalic.  Pulmonary/Chest: He is in respiratory distress.  Musculoskeletal: He exhibits tenderness. Low back, paraspinal bilateral. Neurological: He is alert and oriented to person, place, and time.  Skin: Skin is warm and dry.  Psychiatric: He has a normal mood and affect.  Symmetric normal motor tone is noted throughout. Normal muscle bulk. Muscle testing reveals 4/5 muscle strength of the upper extremity, and 4-/5 of the lower extremity. Full range of motion in upper and lower extremities. ROM of C-spine is severely restricted, but has improved since last visit.  DTR in the upper and lower extremity are present and symmetric 3+. No clonus is noted.  Patient arises from chair with some difficulty. Wide based gait with normal arm swing bilateral.         Assessment & Plan:  This is a 51 year old male with  1. Cervical spondylosis with myelopathy  2. Status post ACDF C3 to C6  3. 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 Neck pain  5. low back pain, exacerbation s/p fall  6. Emphysema, COPD  Plan :  Prescribed Lumbar brace, at last visit, to support his back when he walks and is active in and around his home. His pulmonologist also advised the patient to walk more, to improve his breathing, but because of his back pain he is not able to walk for a prolonged time, with the brace he most likely will be able to walk longer distances, which would be beneficial for his low back pain and his COPD. The patient reports, that this has really given him relief.  Advised patient to continue with the exercises he learned from PT.   Refilled his Percocet, and also his Valium. Increased his Valium from 5mg   bid to tid,  for relief of muscle spasms. Patient is asking about increasing his oxycodone to 10mg  qid, will talk to Dr. Doroteo Bradford, before patient's next visit. Follow up in with PA in 1 month.

## 2011-12-05 LAB — PULMONARY FUNCTION TEST

## 2011-12-08 ENCOUNTER — Telehealth: Payer: Self-pay

## 2011-12-08 NOTE — Telephone Encounter (Signed)
Lm checking on patient and his smoking status.  Told him to call if he has any questions or concerns.

## 2011-12-11 ENCOUNTER — Ambulatory Visit (INDEPENDENT_AMBULATORY_CARE_PROVIDER_SITE_OTHER): Payer: Medicare Other | Admitting: Emergency Medicine

## 2011-12-11 ENCOUNTER — Encounter: Payer: Self-pay | Admitting: Emergency Medicine

## 2011-12-11 VITALS — BP 136/92 | HR 100 | Temp 98.0°F | Wt 221.2 lb

## 2011-12-11 DIAGNOSIS — Z23 Encounter for immunization: Secondary | ICD-10-CM

## 2011-12-11 DIAGNOSIS — J449 Chronic obstructive pulmonary disease, unspecified: Secondary | ICD-10-CM

## 2011-12-11 NOTE — Patient Instructions (Addendum)
Please continue your Advair twice a day Use your combivent as needed, but not more frequently than every 6 hours You can use your albuterol up to every 4 hours if needed Flu shot today You need to stop smoking. Your plan to stop at [facility] is a good one - please call our office if we can help support you through quitting.  Follow with Dr Delton Coombes in 3 months or sooner if you have any problems.

## 2011-12-11 NOTE — Assessment & Plan Note (Signed)
FEV1 1.61 (43% predicted), stable for last 2 years.  - need to concentrate on smoking cessation - discussed decreasing combivent to q6h - albuterol prn - advair bid - flu shot today - rov 3 mon

## 2011-12-11 NOTE — Progress Notes (Signed)
  Subjective:    Patient ID: Scott [name], male    DOB: Sep 20, 1960, 51 y.o.   MRN: [id number]  HPI 51 yo man, tobacco user (smoking 1/2 pk/day), hx of COPD in a study at [facility]. Has had PFT at [facility] and ? At [facility] as well. Now following with Dr Reche Dixon at [facility]. He has been having increased combivent use for SOB. Coughs every day, has been worse since anterior cervical fusion in November. Sputum is clear to yellow.  He is also on Advair.  He has bad GERD, better w omeprazole. Has allergy sx, on no meds.   ROV 07/11/11 -- COPD, still smoking 5 cig a day. Tells me that his breathing is the same to worse. Taking Advair bid, combivent ordered qid but he uses prn. Has nicotine patches but not using. Discussed cigarette cessation in detail today. Talked about setting a quit date Tells me that he benefited from the loratadine and fluticasone but stopped taking.   ROV 09/12/11 -- COPD, still smoking 2-3 cig a day. Returns for f/u. Has been on combivent prn + Advair bid. Using proventil, not as beneficial as the ventolin. Remains on loratadine + fluticasone. Also remains on omeprazole. No AE since last visit, although he did take some spare prednisone that he had last month. No abx. He is interested in entering a clinical trial at [facility].   ROV 12/11/11 -- COPD. Returns for f/u. Has been on combivent prn + Advair bid. He had stopped smoking x 2 weeks but then restarted a few days ago, currently on 2-3 a day. He had spirometry as part of clinical trial at [facility] 12/05/11, FEV1 stable at 1.61L (43% pred), ratio 52%. Uses SABA 4-6 x a day. Planning to quit tobacco at [facility]. No AE since last visit.      Objective:   Physical Exam Filed Vitals:   12/11/11 1353  BP: 136/92  Pulse: 100  Temp: 98 F (36.7 C)   Gen: Pleasant, well-nourished, in no distress,  anxious  ENT: No lesions,  mouth clear,  oropharynx clear, no postnasal drip,   Neck: No JVD, no TMG, no carotid bruits, insp  stridor  Lungs: No use of accessory muscles, distant, clear without rales or rhonchi  Cardiovascular: RRR, heart sounds normal, no murmur or gallops, no peripheral edema  Musculoskeletal: No deformities, no cyanosis or clubbing  Neuro: alert, non focal  Skin: Warm, no lesions or rashes     Assessment & Plan:  COPD (chronic obstructive pulmonary disease) FEV1 1.61 (43% predicted), stable for last 2 years.  - need to concentrate on smoking cessation - discussed decreasing combivent to q6h - albuterol prn - advair bid - flu shot today - rov 3 mon

## 2011-12-20 ENCOUNTER — Encounter: Payer: Self-pay | Admitting: Physical Medicine and Rehabilitation

## 2011-12-20 ENCOUNTER — Encounter
Payer: Medicare Other | Attending: Physical Medicine and Rehabilitation | Admitting: Physical Medicine and Rehabilitation

## 2011-12-20 VITALS — BP 143/100 | HR 104 | Resp 16 | Ht 68.0 in | Wt 223.0 lb

## 2011-12-20 DIAGNOSIS — M542 Cervicalgia: Secondary | ICD-10-CM | POA: Insufficient documentation

## 2011-12-20 DIAGNOSIS — Z981 Arthrodesis status: Secondary | ICD-10-CM | POA: Insufficient documentation

## 2011-12-20 DIAGNOSIS — M545 Low back pain, unspecified: Secondary | ICD-10-CM | POA: Insufficient documentation

## 2011-12-20 DIAGNOSIS — J438 Other emphysema: Secondary | ICD-10-CM | POA: Insufficient documentation

## 2011-12-20 DIAGNOSIS — K219 Gastro-esophageal reflux disease without esophagitis: Secondary | ICD-10-CM | POA: Insufficient documentation

## 2011-12-20 DIAGNOSIS — G8929 Other chronic pain: Secondary | ICD-10-CM | POA: Insufficient documentation

## 2011-12-20 DIAGNOSIS — M5137 Other intervertebral disc degeneration, lumbosacral region: Secondary | ICD-10-CM | POA: Insufficient documentation

## 2011-12-20 DIAGNOSIS — M47816 Spondylosis without myelopathy or radiculopathy, lumbar region: Secondary | ICD-10-CM

## 2011-12-20 DIAGNOSIS — M4712 Other spondylosis with myelopathy, cervical region: Secondary | ICD-10-CM | POA: Insufficient documentation

## 2011-12-20 DIAGNOSIS — M47817 Spondylosis without myelopathy or radiculopathy, lumbosacral region: Secondary | ICD-10-CM

## 2011-12-20 DIAGNOSIS — F17200 Nicotine dependence, unspecified, uncomplicated: Secondary | ICD-10-CM | POA: Insufficient documentation

## 2011-12-20 DIAGNOSIS — M51379 Other intervertebral disc degeneration, lumbosacral region without mention of lumbar back pain or lower extremity pain: Secondary | ICD-10-CM | POA: Insufficient documentation

## 2011-12-20 MED ORDER — METHYLPREDNISOLONE 4 MG PO KIT: PACK | ORAL | Status: DC

## 2011-12-20 MED ORDER — OXYCODONE-ACETAMINOPHEN 7.5-325 MG PO TABS: 1.0000 | ORAL_TABLET | Freq: Four times a day (QID) | ORAL | Status: DC

## 2011-12-20 MED ORDER — DIAZEPAM 5 MG PO TABS: 5.0000 mg | ORAL_TABLET | Freq: Three times a day (TID) | ORAL | Status: DC

## 2011-12-20 NOTE — Patient Instructions (Signed)
Try to rest , use ice and heat for pain relief.

## 2011-12-20 NOTE — Progress Notes (Signed)
Subjective:    Patient ID: Scott [name], male    DOB: [birth year], 51 y.o.   MRN: [id number]  HPI The patient complains about chronic neck and low back pain which radiates into his shoulders bilateral.  The neck pain has been pretty stable. He also reports that his range of motion in his neck has improved since he is doing physical therapy.He reports that his pulmonologist advised him to walk more.The patient reports, that he had to pick up a heavy back 12 days ago, and since then he has excruciating LBP, which radiates into his LE bilateral, in a L5 distribution. He states, that he has quitt smoking with the help pulmonologist.He reports that his breathing is much better.  Pain Inventory Average Pain 10 Pain Right Now 10 My pain is sharp, burning, dull, stabbing, tingling and aching  In the last 24 hours, has pain interfered with the following? General activity 10 Relation with others 10 Enjoyment of life 10 What TIME of day is your pain at its worst? all the time Sleep (in general) Poor  Pain is worse with: walking, bending, sitting and standing Pain improves with: rest and medication Relief from Meds: 7  Mobility walk without assistance use a cane use a walker how many minutes can you walk? 5-7 ability to climb steps?  no do you drive?  no use a wheelchair transfers alone Do you have any goals in this area?  no  Function disabled: date disabled 11/09 I need assistance with the following:  dressing, bathing, meal prep, household duties and shopping Do you have any goals in this area?  no  Neuro/Psych weakness numbness tingling trouble walking dizziness anxiety loss of taste or smell  Prior Studies Fall  Physicians involved in your care Any changes since last visit?  no   Family History  Problem Relation Age of Onset  . Emphysema Mother   . COPD Mother   . Asthma Brother   . Asthma Sister   . Heart disease Maternal Uncle    History   Social History    . Marital Status: Single    Spouse Name: N/A    Number of Children: N/A  . Years of Education: N/A   Occupational History  . DISABLED    Social History Main Topics  . Smoking status: Current Every Day Smoker -- 0.3 packs/day    Types: Cigarettes  . Smokeless tobacco: Never Used     Comment: started smoking 12/11/11-- 2-3 cigs per day  . Alcohol Use: No  . Drug Use: No  . Sexually Active: None   Other Topics Concern  . None   Social History Narrative  . None   Past Surgical History  Procedure Date  . Tonsillectomy   . Other surgical history     surgery on cheekbone and head for fall 2000  . 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 Multiple tooth extractions   . Anterior cervical decomp/discectomy fusion 12/22/2010    Procedure: ANTERIOR CERVICAL DECOMPRESSION/DISCECTOMY FUSION 3 LEVELS;  Surgeon: Cristi Loron;  Location: [facility];  Service: Neurosurgery;  Laterality: N/A;  Anterior cervical discectomy with fusion cervical three-four, four-five, and five sixCDF with Interbody Prosthesis, plating, and Bone Graft    Past Medical History  Diagnosis Date  . COPD (chronic obstructive pulmonary disease)   . GERD (gastroesophageal reflux disease)   .  Headache   . Arthritis     states MD told him he has arthritis in spine   BP 143/100  Pulse 104  Resp 16  Ht 5\' 8"  (1.727 m)  Wt 223 lb (101.152 kg)  BMI 33.91 kg/m2  SpO2 95%     Review of Systems  Constitutional: Positive for diaphoresis and unexpected weight change.  Respiratory: Positive for cough, shortness of breath and wheezing.   Musculoskeletal: Positive for myalgias, back pain, arthralgias and gait problem.  Neurological: Positive for dizziness, weakness and numbness.  Psychiatric/Behavioral: The patient is nervous/anxious.   All other systems reviewed and are negative.       Objective:   Physical  Exam Constitutional: He is oriented to person, place, and time. He appears well-developed and well-nourished.  HENT:  Head: Normocephalic.  Pulmonary/Chest: He is in no distress. Musculoskeletal: He exhibits tenderness. Low back, paraspinal bilateral, worse on the right. Neurological: He is alert and oriented to person, place, and time.  Skin: Skin is warm and dry.  Psychiatric: He has a normal mood and affect.  Symmetric normal motor tone is noted throughout. Normal muscle bulk. Muscle testing reveals 4/5 muscle strength of the upper extremity, and 4-/5 of the lower extremity. Full range of motion in upper and lower extremities. ROM of C-spine is severely restricted, but has improved since last visit.  DTR in the upper and lower extremity are present and symmetric 3+. No clonus is noted.  Patient arises from chair with some difficulty. Wide based gait with a limp, and deviation of spine to the right.  SLR positive on the right.        Assessment & Plan:  This is a 51 year old male with  1. Cervical spondylosis with myelopathy  2. Status post ACDF C3 to C6  3. 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 Neck pain  5. low back pain,with radiating into the LEs bilateral in L5 distribution, exacerbation s/p lifting heavy . Ordered MRI of L-spine, prescribed dose pack , which patient has tolerated well in the past, for his exacerbation, and worsening of Sx. 6. Emphysema, COPD  Plan  Advised patient to continue with resting applying heat and /or ice, trying to walk some if pain allows.  Refilled his Percocet, and also his Valium.  Follow up in with PA in 1 month.

## 2011-12-28 ENCOUNTER — Ambulatory Visit (HOSPITAL_COMMUNITY)
Admission: RE | Admit: 2011-12-28 | Discharge: 2011-12-28 | Disposition: A | Discharge status: Home / Self Care | Payer: Medicare Other | Source: Ambulatory Visit | Attending: Physical Medicine and Rehabilitation | Admitting: Physical Medicine and Rehabilitation

## 2011-12-28 DIAGNOSIS — M47816 Spondylosis without myelopathy or radiculopathy, lumbar region: Secondary | ICD-10-CM

## 2011-12-28 DIAGNOSIS — M5126 Other intervertebral disc displacement, lumbar region: Secondary | ICD-10-CM | POA: Insufficient documentation

## 2011-12-28 DIAGNOSIS — M4800 Spinal stenosis, site unspecified: Secondary | ICD-10-CM | POA: Insufficient documentation

## 2011-12-28 DIAGNOSIS — M545 Low back pain, unspecified: Secondary | ICD-10-CM | POA: Insufficient documentation

## 2011-12-28 DIAGNOSIS — M79609 Pain in unspecified limb: Secondary | ICD-10-CM | POA: Insufficient documentation

## 2011-12-29 ENCOUNTER — Telehealth: Payer: Self-pay

## 2011-12-29 NOTE — Telephone Encounter (Signed)
Message copied by Judd Gaudier on Fri Dec 29, 2011 10:20 AM ------      Message from: Su Monks      Created: Fri Dec 29, 2011  9:40 AM       Please inform patient , that MRI was stable compared to his last one, except at L4-5, there is a mildly progressive annular disc bulging, resulting in      mildly progressive central and lateral recess stenosis bilaterally. We can discuss at next visit in more detail.

## 2011-12-29 NOTE — Telephone Encounter (Signed)
Left message for patient to call office regarding MRI results. 

## 2012-01-02 NOTE — Telephone Encounter (Signed)
Left message for patient to call regarding MRI results.

## 2012-01-05 ENCOUNTER — Other Ambulatory Visit: Payer: Self-pay | Admitting: Emergency Medicine

## 2012-01-08 NOTE — Telephone Encounter (Signed)
Pharmacy faxed Refill request for:  Nicotine patch 7mg /24hr - 1 patch daily  Omeprazole 40mg --1 tab daily  #30 6 refills  Ok to fill?

## 2012-01-12 ENCOUNTER — Telehealth: Payer: Self-pay

## 2012-01-12 NOTE — Telephone Encounter (Signed)
Patient informed of MRI results. 

## 2012-01-15 ENCOUNTER — Other Ambulatory Visit: Payer: Self-pay | Admitting: *Deleted

## 2012-01-15 MED ORDER — NICOTINE 7 MG/24HR TD PT24: 1.0000 | MEDICATED_PATCH | TRANSDERMAL | Status: DC

## 2012-01-15 MED ORDER — OMEPRAZOLE 40 MG PO CPDR: 40.0000 mg | DELAYED_RELEASE_CAPSULE | Freq: Every day | ORAL | Status: DC

## 2012-01-15 NOTE — Telephone Encounter (Signed)
Yes ok to fill

## 2012-01-15 NOTE — Telephone Encounter (Signed)
Rx filled

## 2012-01-22 ENCOUNTER — Encounter: Payer: Self-pay | Admitting: Physical Medicine and Rehabilitation

## 2012-01-22 ENCOUNTER — Encounter
Payer: Medicare Other | Attending: Physical Medicine and Rehabilitation | Admitting: Physical Medicine and Rehabilitation

## 2012-01-22 VITALS — BP 133/84 | HR 111 | Resp 14 | Ht 68.0 in | Wt 225.8 lb

## 2012-01-22 DIAGNOSIS — M542 Cervicalgia: Secondary | ICD-10-CM | POA: Insufficient documentation

## 2012-01-22 DIAGNOSIS — J449 Chronic obstructive pulmonary disease, unspecified: Secondary | ICD-10-CM | POA: Insufficient documentation

## 2012-01-22 DIAGNOSIS — M51379 Other intervertebral disc degeneration, lumbosacral region without mention of lumbar back pain or lower extremity pain: Secondary | ICD-10-CM | POA: Insufficient documentation

## 2012-01-22 DIAGNOSIS — J4489 Other specified chronic obstructive pulmonary disease: Secondary | ICD-10-CM | POA: Insufficient documentation

## 2012-01-22 DIAGNOSIS — M47817 Spondylosis without myelopathy or radiculopathy, lumbosacral region: Secondary | ICD-10-CM

## 2012-01-22 DIAGNOSIS — M47816 Spondylosis without myelopathy or radiculopathy, lumbar region: Secondary | ICD-10-CM

## 2012-01-22 DIAGNOSIS — M545 Low back pain, unspecified: Secondary | ICD-10-CM | POA: Insufficient documentation

## 2012-01-22 DIAGNOSIS — M4712 Other spondylosis with myelopathy, cervical region: Secondary | ICD-10-CM | POA: Insufficient documentation

## 2012-01-22 DIAGNOSIS — M5137 Other intervertebral disc degeneration, lumbosacral region: Secondary | ICD-10-CM | POA: Insufficient documentation

## 2012-01-22 DIAGNOSIS — M79609 Pain in unspecified limb: Secondary | ICD-10-CM | POA: Insufficient documentation

## 2012-01-22 DIAGNOSIS — Z981 Arthrodesis status: Secondary | ICD-10-CM | POA: Insufficient documentation

## 2012-01-22 MED ORDER — OXYCODONE-ACETAMINOPHEN 7.5-325 MG PO TABS: 1.0000 | ORAL_TABLET | Freq: Four times a day (QID) | ORAL | Status: DC

## 2012-01-22 MED ORDER — DIAZEPAM 5 MG PO TABS: 5.0000 mg | ORAL_TABLET | Freq: Three times a day (TID) | ORAL | Status: DC

## 2012-01-22 NOTE — Patient Instructions (Signed)
Try to walk some as tolerated

## 2012-01-22 NOTE — Progress Notes (Signed)
Subjective:    Patient ID: Scott [name], male    DOB: 1960/06/17, 51 y.o.   MRN: [id number]  HPI The patient complains about chronic neck and low back pain which radiates into his shoulders bilateral.  The neck pain has been pretty stable. He also reports that his range of motion in his neck has improved since he is doing physical therapy.He reports that his pulmonologist advised him to walk more.The patient reports, that he had to pick up a heavy back 2 month ago, and since then he has  LBP, which radiates into his LE bilateral, in a L5 distribution. The prednisone dose pack has helped some. He states, that he has quitt smoking with the help pulmonologist.He reports that his breathing is much better.  Pain Inventory Average Pain 10 Pain Right Now 9 My pain is sharp, stabbing and tingling  In the last 24 hours, has pain interfered with the following? General activity 10 Relation with others 10 Enjoyment of life 10 What TIME of day is your pain at its worst? all the time Sleep (in general) Poor  Pain is worse with: walking, bending, sitting and standing Pain improves with: rest and medication Relief from Meds: 5  Mobility walk without assistance use a cane how many minutes can you walk? 5-7 ability to climb steps?  no do you drive?  no use a wheelchair transfers alone Do you have any goals in this area?  no  Function disabled: date disabled 11/2007 I need assistance with the following:  dressing, bathing, meal prep, household duties and shopping Do you have any goals in this area?  no  Neuro/Psych weakness numbness tingling dizziness anxiety loss of taste or smell  Prior Studies Any changes since last visit?  no  Physicians involved in your care Any changes since last visit?  no   Family History  Problem Relation Age of Onset  . Emphysema Mother   . COPD Mother   . Asthma Brother   . Asthma Sister   . Heart disease Maternal Uncle    History   Social  History  . Marital Status: Single    Spouse Name: N/A    Number of Children: N/A  . Years of Education: N/A   Occupational History  . DISABLED    Social History Main Topics  . Smoking status: Former Smoker -- 0.3 packs/day    Types: Cigarettes    Quit date: 01/15/2012  . Smokeless tobacco: Never Used     Comment: started smoking 12/11/11-- 2-3 cigs per day  . Alcohol Use: No  . Drug Use: No  . Sexually Active: None   Other Topics Concern  . None   Social History Narrative  . None   Past Surgical History  Procedure Date  . Tonsillectomy   . Other surgical history     surgery on cheekbone and head for fall 2000  . 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 Multiple tooth extractions   . Anterior cervical decomp/discectomy fusion 12/22/2010    Procedure: ANTERIOR CERVICAL DECOMPRESSION/DISCECTOMY FUSION 3 LEVELS;  Surgeon: Cristi Loron;  Location: [facility];  Service: Neurosurgery;  Laterality: N/A;  Anterior cervical discectomy with fusion cervical three-four, four-five, and five sixCDF with Interbody Prosthesis, plating, and Bone Graft    Past Medical History  Diagnosis Date  . COPD (chronic obstructive pulmonary  disease)   . GERD (gastroesophageal reflux disease)   . Headache   . Arthritis     states MD told him he has arthritis in spine   BP 133/84  Pulse 111  Resp 14  Ht 5\' 8"  (1.727 m)  Wt 225 lb 12.8 oz (102.422 kg)  BMI 34.33 kg/m2  SpO2 93%    Review of Systems  Constitutional: Positive for diaphoresis.  Respiratory: Positive for cough, shortness of breath and wheezing.   Musculoskeletal: Positive for back pain.  Neurological: Positive for dizziness, weakness and numbness.       Tingling  Psychiatric/Behavioral: The patient is nervous/anxious.   All other systems reviewed and are negative.       Objective:   Physical Exam Constitutional: He  is oriented to person, place, and time. He appears well-developed and well-nourished.  HENT:  Head: Normocephalic.  Pulmonary/Chest: He is in no distress. Musculoskeletal: He exhibits tenderness. Low back, paraspinal bilateral, worse on the right. Neurological: He is alert and oriented to person, place, and time.  Skin: Skin is warm and dry.  Psychiatric: He has a normal mood and affect.  Symmetric normal motor tone is noted throughout. Normal muscle bulk. Muscle testing reveals 4/5 muscle strength of the upper extremity, and 4-/5 of the lower extremity. Full range of motion in upper and lower extremities. ROM of C-spine is severely restricted, but has improved since last visit.  DTR in the upper and lower extremity are present and symmetric 3+. No clonus is noted.  Patient arises from chair with some difficulty. Wide based gait with a limp, and deviation of spine to the right.  SLR positive on the right.        Assessment & Plan:  This is a 51 year old male with  1. Cervical spondylosis with myelopathy  2. Status post ACDF C3 to C6  3. 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 Neck pain  5. low back pain,with radiating into the LEs bilateral in L5 distribution, exacerbation s/p lifting heavy . Ordered MRI of L-spine,MRI was stable compared to his last one, except at L4-5, there is a mildly progressive annular disc bulging, resulting in mildly progressive central and lateral recess stenosis bilaterally. Patient does not want ESI, they have not helped him in the past. He states, that he has an appointment with Neurosurgery in February, and he wants to wait until then, for possible further treatment options. MRI and further treatment options were discussed in length.    6. Emphysema, COPD  Plan  Advised patient to continue with resting applying heat and /or ice, trying to walk some if pain allows.  Refilled his Percocet, percocet was increased from 4 1/2 to 5 per day today.Discussed  with patient that this is not long term, just until he sees neurosurgeon for other options. Also refilled his Valium, which gives him good relief from his muscle spasms.  Follow up in with PA in 1 month.

## 2012-01-29 ENCOUNTER — Telehealth: Payer: Self-pay | Admitting: Emergency Medicine

## 2012-01-29 NOTE — Telephone Encounter (Signed)
We don't have samples at this time.  I attempted to call the pt but I didn't get an answer and I couldn't leave a message. WCB.

## 2012-01-30 NOTE — Telephone Encounter (Signed)
Pt is aware we have no albuterol samples at this time and verbalized understanding. He has been using his rescue inhaler a little more since the weather changed and will not be due for a refill or cannot get a refill until 02/07/12. Pt will call back to check on samples if he runs out of his albuterol before the 15th.

## 2012-02-19 ENCOUNTER — Encounter
Payer: PRIVATE HEALTH INSURANCE | Attending: Physical Medicine and Rehabilitation | Admitting: Physical Medicine and Rehabilitation

## 2012-02-19 ENCOUNTER — Encounter: Payer: Self-pay | Admitting: Physical Medicine and Rehabilitation

## 2012-02-19 VITALS — BP 133/104 | HR 112 | Resp 14 | Ht 68.0 in | Wt 225.0 lb

## 2012-02-19 DIAGNOSIS — Z5181 Encounter for therapeutic drug level monitoring: Secondary | ICD-10-CM

## 2012-02-19 DIAGNOSIS — M51379 Other intervertebral disc degeneration, lumbosacral region without mention of lumbar back pain or lower extremity pain: Secondary | ICD-10-CM | POA: Insufficient documentation

## 2012-02-19 DIAGNOSIS — M542 Cervicalgia: Secondary | ICD-10-CM | POA: Insufficient documentation

## 2012-02-19 DIAGNOSIS — M47816 Spondylosis without myelopathy or radiculopathy, lumbar region: Secondary | ICD-10-CM

## 2012-02-19 DIAGNOSIS — Z981 Arthrodesis status: Secondary | ICD-10-CM | POA: Insufficient documentation

## 2012-02-19 DIAGNOSIS — M47817 Spondylosis without myelopathy or radiculopathy, lumbosacral region: Secondary | ICD-10-CM

## 2012-02-19 DIAGNOSIS — M47812 Spondylosis without myelopathy or radiculopathy, cervical region: Secondary | ICD-10-CM

## 2012-02-19 DIAGNOSIS — M5137 Other intervertebral disc degeneration, lumbosacral region: Secondary | ICD-10-CM | POA: Insufficient documentation

## 2012-02-19 DIAGNOSIS — J449 Chronic obstructive pulmonary disease, unspecified: Secondary | ICD-10-CM | POA: Insufficient documentation

## 2012-02-19 DIAGNOSIS — M4712 Other spondylosis with myelopathy, cervical region: Secondary | ICD-10-CM | POA: Insufficient documentation

## 2012-02-19 DIAGNOSIS — G8929 Other chronic pain: Secondary | ICD-10-CM | POA: Insufficient documentation

## 2012-02-19 DIAGNOSIS — J4489 Other specified chronic obstructive pulmonary disease: Secondary | ICD-10-CM | POA: Insufficient documentation

## 2012-02-19 MED ORDER — DIAZEPAM 5 MG PO TABS: 5.0000 mg | ORAL_TABLET | Freq: Three times a day (TID) | ORAL | Status: DC

## 2012-02-19 MED ORDER — OXYCODONE-ACETAMINOPHEN 7.5-325 MG PO TABS: 1.0000 | ORAL_TABLET | Freq: Four times a day (QID) | ORAL | Status: DC

## 2012-02-19 NOTE — Progress Notes (Signed)
Subjective:    Patient ID: Scott [name], male    DOB: November 02, 1960, 52 y.o.   MRN: [id number]  HPI The patient complains about chronic neck and low back pain which radiates into his shoulders bilateral.  The neck pain has been pretty stable. He also reports that his range of motion in his neck has improved since he is doing physical therapy.He reports that his pulmonologist advised him to walk more.The patient reports, that he had to pick up a heavy back 2 month ago, and since then he has LBP, which radiates into his LE bilateral, in a L5 distribution. The prednisone dose pack has helped some. He states, that he has quitt smoking with the help pulmonologist.He reports that his breathing is much better. He is doing much better today, he states, that he can walk for much longer now, because he quit smoking, which helps him with his back pain.  Pain Inventory Average Pain 10 Pain Right Now 8 My pain is sharp, stabbing and tingling  In the last 24 hours, has pain interfered with the following? General activity 10 Relation with others 10 Enjoyment of life 10 What TIME of day is your pain at its worst? all the time Sleep (in general) Poor  Pain is worse with: walking, bending, sitting and standing Pain improves with: rest and medication Relief from Meds: 8  Mobility walk without assistance use a cane how many minutes can you walk? 5-7 ability to climb steps?  no do you drive?  no use a wheelchair transfers alone Do you have any goals in this area?  no  Function disabled: date disabled 09 I need assistance with the following:  dressing, bathing, meal prep, household duties and shopping Do you have any goals in this area?  no  Neuro/Psych weakness numbness tingling trouble walking dizziness depression anxiety loss of taste or smell  Prior Studies Any changes since last visit?  no  Physicians involved in your care Any changes since last visit?  no   Family History    Problem Relation Age of Onset  . Emphysema Mother   . COPD Mother   . Asthma Brother   . Asthma Sister   . Heart disease Maternal Uncle    History   Social History  . Marital Status: Single    Spouse Name: N/A    Number of Children: N/A  . Years of Education: N/A   Occupational History  . DISABLED    Social History Main Topics  . Smoking status: Former Smoker -- 0.3 packs/day    Types: Cigarettes    Quit date: 01/15/2012  . Smokeless tobacco: Never Used     Comment: started smoking 12/11/11-- 2-3 cigs per day  . Alcohol Use: No  . Drug Use: No  . Sexually Active: None   Other Topics Concern  . None   Social History Narrative  . None   Past Surgical History  Procedure Date  . Tonsillectomy   . Other surgical history     surgery on cheekbone and head for fall 2000  . 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 Multiple tooth extractions   . Anterior cervical decomp/discectomy fusion 12/22/2010    Procedure: ANTERIOR CERVICAL DECOMPRESSION/DISCECTOMY FUSION 3 LEVELS;  Surgeon: Cristi Loron;  Location: [facility];  Service: Neurosurgery;  Laterality: N/A;  Anterior cervical discectomy  with fusion cervical three-four, four-five, and five sixCDF with Interbody Prosthesis, plating, and Bone Graft    Past Medical History  Diagnosis Date  . COPD (chronic obstructive pulmonary disease)   . GERD (gastroesophageal reflux disease)   . Headache   . Arthritis     states MD told him he has arthritis in spine   BP 133/104  Pulse 112  Resp 14  Ht 5\' 8"  (1.727 m)  Wt 225 lb (102.059 kg)  BMI 34.21 kg/m2  SpO2 96%     Review of Systems  Constitutional: Positive for diaphoresis and unexpected weight change.  Respiratory: Positive for shortness of breath and wheezing.   Neurological: Positive for dizziness, weakness and numbness.  Psychiatric/Behavioral: Positive for  dysphoric mood. The patient is nervous/anxious.   All other systems reviewed and are negative.       Objective:   Physical Exam Constitutional: He is oriented to person, place, and time. He appears well-developed and well-nourished.  HENT:  Head: Normocephalic.  Pulmonary/Chest: He is in no distress. Musculoskeletal: He exhibits tenderness. Low back, paraspinal bilateral, worse on the right. Neurological: He is alert and oriented to person, place, and time.  Skin: Skin is warm and dry.  Psychiatric: He has a normal mood and affect.  Symmetric normal motor tone is noted throughout. Normal muscle bulk. Muscle testing reveals 4/5 muscle strength of the upper extremity, and 4-/5 of the lower extremity. Full range of motion in upper and lower extremities. ROM of C-spine is severely restricted, but has improved since last visit.  DTR in the upper and lower extremity are present and symmetric 3+. No clonus is noted.  Patient arises from chair with some difficulty. Wide based gait with a limp, and deviation of spine to the right.  SLR positive on the right.        Assessment & Plan:  This is a 52 year old male with  1. Cervical spondylosis with myelopathy  2. Status post ACDF C3 to C6  3. 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 Neck pain  5. low back pain,with radiating into the LEs bilateral in L5 distribution, exacerbation s/p lifting heavy . Ordered MRI of L-spine,MRI was stable compared to his last one, except at L4-5, there is a mildly progressive annular disc bulging, resulting in mildly progressive central and lateral recess stenosis bilaterally. Patient does not want ESI, they have not helped him in the past. He states, that he would like an appointment with Neurosurgery in March or April, because he already has 3 doctor appointments next month, and his back is doing better this time. He wants to wait until then, for possible further treatment options. MRI and further treatment  options were discussed in length.  6. Emphysema, COPD  Plan  Advised patient to continue with resting applying heat and /or ice, trying to walk some if pain allows.  Refilled his Percocet, percocet was increased from 4 1/2 to 5 per day today.Discussed with patient that this is not long term, just until he sees neurosurgeon for other options. Also refilled his Valium, which gives him good relief from his muscle spasms.  Follow up in with PA in 1 month.

## 2012-02-19 NOTE — Patient Instructions (Signed)
Continue with your walking, it is great that you stopped smoking ! Continue !

## 2012-03-18 ENCOUNTER — Encounter: Payer: Self-pay | Admitting: Emergency Medicine

## 2012-03-18 ENCOUNTER — Ambulatory Visit (INDEPENDENT_AMBULATORY_CARE_PROVIDER_SITE_OTHER): Payer: Medicare Other | Admitting: Emergency Medicine

## 2012-03-18 ENCOUNTER — Encounter: Payer: Self-pay | Admitting: *Deleted

## 2012-03-18 VITALS — BP 126/90 | HR 111 | Temp 98.0°F | Ht 68.0 in | Wt 225.6 lb

## 2012-03-18 DIAGNOSIS — J449 Chronic obstructive pulmonary disease, unspecified: Secondary | ICD-10-CM

## 2012-03-18 NOTE — Progress Notes (Signed)
  Subjective:    Patient ID: Scott [name], male    DOB: 11-26-1960, 52 y.o.   MRN: [id number]  HPI 52 yo man, tobacco user (smoking 1/2 pk/day), hx of COPD in a study at [facility]. Has had PFT at [facility] and ? [facility] as well. Now following with Dr Reche Dixon at [facility]. He has been having increased combivent use for SOB. Coughs every day, has been worse since anterior cervical fusion in November. Sputum is clear to yellow.  He is also on Advair.  He has bad GERD, better w omeprazole. Has allergy sx, on no meds.   ROV 07/11/11 -- COPD, still smoking 5 cig a day. Tells me that his breathing is the same to worse. Taking Advair bid, combivent ordered qid but he uses prn. Has nicotine patches but not using. Discussed cigarette cessation in detail today. Talked about setting a quit date Tells me that he benefited from the loratadine and fluticasone but stopped taking.   ROV 09/12/11 -- COPD, still smoking 2-3 cig a day. Returns for f/u. Has been on combivent prn + Advair bid. Using proventil, not as beneficial as the ventolin. Remains on loratadine + fluticasone. Also remains on omeprazole. No AE since last visit, although he did take some spare prednisone that he had last month. No abx. He is interested in entering a clinical trial at [facility].   ROV 12/11/11 -- COPD. Returns for f/u. Has been on combivent prn + Advair bid. He had stopped smoking x 2 weeks but then restarted a few days ago, currently on 2-3 a day. He had spirometry as part of clinical trial at [facility] 12/05/11, FEV1 stable at 1.61L (43% pred), ratio 52%. Uses SABA 4-6 x a day. Planning to quit tobacco at [facility]. No AE since last visit.   ROV 03/19/12 -- COPD. Returns for f/u. Has been on combivent prn + Advair bid. He was able to quit tobacco x 10 weeks! He had a slip-up over the last week, but is trying to quit again. He has a quit-coach through the [facility] quit line. He is having some drainage and cough for the last 2 days. No known URI  exposure. Uses combivent more freq than prescribed.      Objective:   Physical Exam Filed Vitals:   03/18/12 1508  BP: 126/90  Pulse: 111  Temp: 98 F (36.7 C)   Gen: Pleasant, well-nourished, in no distress,  anxious  ENT: No lesions,  mouth clear,  oropharynx clear, no postnasal drip,   Neck: No JVD, no TMG, no carotid bruits, insp stridor  Lungs: No use of accessory muscles, distant, clear without rales or rhonchi  Cardiovascular: RRR, heart sounds normal, no murmur or gallops, no peripheral edema  Musculoskeletal: No deformities, no cyanosis or clubbing  Neuro: alert, non focal  Skin: Warm, no lesions or rashes     Assessment & Plan:  COPD (chronic obstructive pulmonary disease) Advair He overusing combivent, stressed the proper dosing w him Discussed smoking cessation and encouraged him to keep working on it Follow 3 months

## 2012-03-18 NOTE — Patient Instructions (Addendum)
You should be proud of your efforts to stop smoking. Keep working hard on this with your Quit-coach.  Continue your Advair twice a day Use your combivent as needed - try to decrease this to no more than every 6 hours Follow with Dr Delton Coombes in 3 months or sooner if you have any problems.

## 2012-03-18 NOTE — Assessment & Plan Note (Signed)
Advair He overusing combivent, stressed the proper dosing w him Discussed smoking cessation and encouraged him to keep working on it Follow 3 months

## 2012-03-19 ENCOUNTER — Ambulatory Visit: Payer: Medicare Other | Admitting: Physical Medicine and Rehabilitation

## 2012-03-21 ENCOUNTER — Encounter
Payer: Medicare Other | Attending: Physical Medicine and Rehabilitation | Admitting: Physical Medicine and Rehabilitation

## 2012-03-21 ENCOUNTER — Encounter: Payer: Self-pay | Admitting: Physical Medicine and Rehabilitation

## 2012-03-21 VITALS — BP 162/85 | HR 97 | Resp 16 | Ht 68.0 in | Wt 225.0 lb

## 2012-03-21 DIAGNOSIS — M51379 Other intervertebral disc degeneration, lumbosacral region without mention of lumbar back pain or lower extremity pain: Secondary | ICD-10-CM | POA: Insufficient documentation

## 2012-03-21 DIAGNOSIS — J4489 Other specified chronic obstructive pulmonary disease: Secondary | ICD-10-CM | POA: Insufficient documentation

## 2012-03-21 DIAGNOSIS — M47817 Spondylosis without myelopathy or radiculopathy, lumbosacral region: Secondary | ICD-10-CM

## 2012-03-21 DIAGNOSIS — M47812 Spondylosis without myelopathy or radiculopathy, cervical region: Secondary | ICD-10-CM

## 2012-03-21 DIAGNOSIS — Z981 Arthrodesis status: Secondary | ICD-10-CM | POA: Insufficient documentation

## 2012-03-21 DIAGNOSIS — G8929 Other chronic pain: Secondary | ICD-10-CM | POA: Insufficient documentation

## 2012-03-21 DIAGNOSIS — M5137 Other intervertebral disc degeneration, lumbosacral region: Secondary | ICD-10-CM | POA: Insufficient documentation

## 2012-03-21 DIAGNOSIS — M4712 Other spondylosis with myelopathy, cervical region: Secondary | ICD-10-CM | POA: Insufficient documentation

## 2012-03-21 DIAGNOSIS — M545 Low back pain: Secondary | ICD-10-CM

## 2012-03-21 MED ORDER — DIAZEPAM 5 MG PO TABS: 5.0000 mg | ORAL_TABLET | Freq: Three times a day (TID) | ORAL | Status: DC

## 2012-03-21 MED ORDER — OXYCODONE-ACETAMINOPHEN 7.5-325 MG PO TABS: 1.0000 | ORAL_TABLET | Freq: Four times a day (QID) | ORAL | Status: DC

## 2012-03-21 NOTE — Patient Instructions (Addendum)
Try to stay as active as tolerated, continue with your walking program. Start with working out in a pool, since your insurance is offering you the [employer] program.

## 2012-03-21 NOTE — Progress Notes (Signed)
Subjective:    Patient ID: Scott [name], male    DOB: 05/14/1960, 52 y.o.   MRN: [id number]  HPI The patient complains about chronic neck and low back pain which radiates into his shoulders bilateral.  The neck pain has been pretty stable. He also reports that his range of motion in his neck has improved since he is doing physical therapy.He reports that his pulmonologist advised him to walk more.The patient reports, that he had to pick up a heavy back 2 month ago, and since then he has LBP, which radiates into his LE bilateral, in a L5 distribution. The prednisone dose pack has helped some. He states, that he has quitt smoking with the help pulmonologist.He reports that his breathing is much better. He is doing much better today, he states, that he can walk for much longer now, because he quit smoking, which helps him with his back pain.  Pain Inventory Average Pain 10 Pain Right Now 7 My pain is sharp, burning, dull and tingling  In the last 24 hours, has pain interfered with the following? General activity 10 Relation with others 10 Enjoyment of life 10 What TIME of day is your pain at its worst? all the time Sleep (in general) Poor  Pain is worse with: walking, bending, sitting and standing Pain improves with: rest and medication Relief from Meds: 8  Mobility walk without assistance use a cane how many minutes can you walk? 5-7 ability to climb steps?  no do you drive?  no Do you have any goals in this area?  no  Function disabled: date disabled 9/11 I need assistance with the following:  dressing, bathing, meal prep, household duties and shopping Do you have any goals in this area?  no  Neuro/Psych weakness numbness tingling trouble walking dizziness depression loss of taste or smell  Prior Studies Any changes since last visit?  no  Physicians involved in your care Any changes since last visit?  no   Family History  Problem Relation Age of Onset  .  Emphysema Mother   . COPD Mother   . Asthma Brother   . Asthma Sister   . Heart disease Maternal Uncle    History   Social History  . Marital Status: Single    Spouse Name: N/A    Number of Children: N/A  . Years of Education: N/A   Occupational History  . DISABLED    Social History Main Topics  . Smoking status: Current Every Day Smoker -- 0.30 packs/day    Types: Cigarettes  . Smokeless tobacco: Never Used     Comment: started smoking 10wks ago-- 1 cigs per day 03/08/12  . Alcohol Use: No  . Drug Use: No  . Sexually Active: None   Other Topics Concern  . None   Social History Narrative  . None   Past Surgical History  Procedure Laterality Date  . Tonsillectomy    . Other surgical history      surgery on cheekbone and head for fall 2000  . 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 Multiple tooth extractions    . Anterior cervical decomp/discectomy fusion  12/22/2010    Procedure: ANTERIOR CERVICAL DECOMPRESSION/DISCECTOMY FUSION 3 LEVELS;  Surgeon: Cristi Loron;  Location: [facility];  Service: Neurosurgery;  Laterality: N/A;  Anterior cervical discectomy with fusion  cervical three-four, four-five, and five sixCDF with Interbody Prosthesis, plating, and Bone Graft    Past Medical History  Diagnosis Date  . COPD (chronic obstructive pulmonary disease)   . GERD (gastroesophageal reflux disease)   . Headache   . 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 Diabetes mellitus without complication    BP 162/85  Pulse 97  Resp 16  Ht 5\' 8"  (1.727 m)  Wt 225 lb (102.059 kg)  BMI 34.22 kg/m2  SpO2 93%     Review of Systems  Constitutional: Positive for diaphoresis and unexpected weight change.  HENT: Positive for neck pain.   Respiratory: Positive for shortness of breath and wheezing.   Musculoskeletal: Positive for back pain and gait  problem.  Neurological: Positive for weakness and numbness.  Psychiatric/Behavioral: Positive for dysphoric mood.  All other systems reviewed and are negative.       Objective:   Physical Exam Constitutional: He is oriented to person, place, and time. He appears well-developed and well-nourished.  HENT:  Head: Normocephalic.  Pulmonary/Chest: He is in no distress. Musculoskeletal: He exhibits tenderness. Low back, paraspinal bilateral, worse on the right. Neurological: He is alert and oriented to person, place, and time.  Skin: Skin is warm and dry.  Psychiatric: He has a normal mood and affect.  Symmetric normal motor tone is noted throughout. Normal muscle bulk. Muscle testing reveals 4/5 muscle strength of the upper extremity, and 4-/5 of the lower extremity. Full range of motion in upper and lower extremities. ROM of C-spine is severely restricted, but has improved since last visit.  DTR in the upper and lower extremity are present and symmetric 3+. No clonus is noted.  Patient arises from chair with some difficulty. Wide based gait with a limp, and deviation of spine to the right.  SLR positive on the right.        Assessment & Plan:  This is a 52 year old male with  1. Cervical spondylosis with myelopathy  2. Status post ACDF C3 to C6  3. 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 Neck pain  5. low back pain,with radiating into the LEs bilateral in L5 distribution, exacerbation s/p lifting heavy . Ordered MRI of L-spine,MRI was stable compared to his last one, except at L4-5, there is a mildly progressive annular disc bulging, resulting in mildly progressive central and lateral recess stenosis bilaterally. Patient does not want ESI, they have not helped him in the past. He states, that he would like an appointment with Neurosurgery in March or April, because he already has 3 doctor appointments next month, and his back is doing better this time. He wants to wait until then,  for possible further treatment options. MRI and further treatment options were discussed in length.  6. Emphysema, COPD  Plan  Advised patient to continue with resting applying heat and /or ice, trying to walk some if pain allows.  Refilled his Percocet, percocet was increased from 4 1/2 to 5 per day today.Discussed with patient that this is not long term, just until he sees neurosurgeon for other options. Also refilled his Valium, which gives him good relief from his muscle spasms.  Patient got a card from his insurance to use the [employer] program, I recommended exercising in the pool. The patient will have a colonoscopy in March, therefore he can not effort to see another specialist , neurosurgeon next month, if his LBP is worse  he might consider seeing one in April. Follow up in with PA in 1 month.

## 2012-04-01 ENCOUNTER — Telehealth: Payer: Self-pay | Admitting: Emergency Medicine

## 2012-04-01 MED ORDER — DOXYCYCLINE HYCLATE 100 MG PO TABS: 100.0000 mg | ORAL_TABLET | Freq: Two times a day (BID) | ORAL | Status: DC

## 2012-04-01 MED ORDER — PREDNISONE 10 MG PO TABS: ORAL_TABLET | ORAL | Status: DC

## 2012-04-01 NOTE — Telephone Encounter (Signed)
Error

## 2012-04-01 NOTE — Telephone Encounter (Signed)
Patient calling back.  Would like to be called back at [phone]

## 2012-04-01 NOTE — Telephone Encounter (Signed)
Per Dr. Delton Coombes- Doxycycline 100mg  BID x 7days, pred 10mg  taper: 40mg  x3 days, 30mg  x 3 days, 20mg  x3 days, 10 mg x 3 days stop and patient needs to come in and be seen.

## 2012-04-01 NOTE — Addendum Note (Signed)
Addended by: Orma Flaming D on: 04/01/2012 04:59 PM   Modules accepted: Orders

## 2012-04-01 NOTE — Telephone Encounter (Signed)
Patient states he has to let his [employer] drivers know 3 days in advance for transportation and he has a colonoscopy scheduled for Friday, so patient has been scheduled to see Rubye Oaks Mon 04/08/12 at 1045am. Patient aware if symptoms worsen between now and his appt to seek emergency care.Patient verbalized understanding of this and nothing further needed at this time.

## 2012-04-01 NOTE — Telephone Encounter (Signed)
Spoke with patient, states that in past 4 days has been having sob, chest tightness, prod cough with thick green/yellow. Clindamycin was prescribed for patient when he had oral work done, patient states he still had some left over and has taken 2 pills today. Clindamycin has not provided any relief. Patient requesting recs and abx rx.  Dr. Delton Coombes please advise,thank you  Dr. Delton Coombes has been paged for [facility]

## 2012-04-01 NOTE — Telephone Encounter (Signed)
Patient would like to be called back on this number. Nothing else needed at this time

## 2012-04-01 NOTE — Telephone Encounter (Signed)
ATC patient, no answer [id number] 

## 2012-04-08 ENCOUNTER — Telehealth: Payer: Self-pay | Admitting: Adult Health

## 2012-04-08 ENCOUNTER — Ambulatory Visit (INDEPENDENT_AMBULATORY_CARE_PROVIDER_SITE_OTHER)
Admission: RE | Admit: 2012-04-08 | Discharge: 2012-04-08 | Disposition: A | Discharge status: Home / Self Care | Payer: Medicaid Other | Source: Ambulatory Visit | Attending: Adult Health | Admitting: Adult Health

## 2012-04-08 ENCOUNTER — Encounter: Payer: Self-pay | Admitting: Adult Health

## 2012-04-08 ENCOUNTER — Ambulatory Visit (INDEPENDENT_AMBULATORY_CARE_PROVIDER_SITE_OTHER): Payer: Medicare Other | Admitting: Adult Health

## 2012-04-08 VITALS — BP 124/88 | HR 75 | Temp 98.1°F | Ht 68.0 in | Wt 219.2 lb

## 2012-04-08 DIAGNOSIS — J449 Chronic obstructive pulmonary disease, unspecified: Secondary | ICD-10-CM

## 2012-04-08 MED ORDER — DOXYCYCLINE HYCLATE 100 MG PO TABS: 100.0000 mg | ORAL_TABLET | Freq: Two times a day (BID) | ORAL | Status: DC

## 2012-04-08 MED ORDER — HYDROCODONE-HOMATROPINE 5-1.5 MG/5ML PO SYRP: 5.0000 mL | ORAL_SOLUTION | Freq: Four times a day (QID) | ORAL | Status: DC | PRN

## 2012-04-08 MED ORDER — LEVALBUTEROL HCL 0.63 MG/3ML IN NEBU
0.6300 mg | INHALATION_SOLUTION | Freq: Once | RESPIRATORY_TRACT | Status: AC
  Administered 2012-04-08: 0.63 mg via RESPIRATORY_TRACT

## 2012-04-08 MED ORDER — PREDNISONE 10 MG PO TABS: ORAL_TABLET | ORAL | Status: DC

## 2012-04-08 NOTE — Telephone Encounter (Signed)
Error.Scott [name] ° °

## 2012-04-08 NOTE — Progress Notes (Signed)
Subjective:    Patient ID: Scott [name], male    DOB: 03-02-1960, 52 y.o.   MRN: [id number]  HPI 52 yo man, tobacco user (smoking 1/2 pk/day), hx of COPD in a study at [facility]. Has had PFT at [facility] and ? [facility] as well. [facility] He has been having increased combivent use for SOB. Coughs every day, has been worse since anterior cervical fusion in November. Sputum is clear to yellow.  He is also on Advair.  He has bad GERD, better w omeprazole. Has allergy sx, on no meds.   ROV 07/11/11 -- COPD, still smoking 5 cig a day. Tells me that his breathing is the same to worse. Taking Advair bid, combivent ordered qid but he uses prn. Has nicotine patches but not using. Discussed cigarette cessation in detail today. Talked about setting a quit date Tells me that he benefited from the loratadine and fluticasone but stopped taking.   ROV 09/12/11 -- COPD, still smoking 2-3 cig a day. Returns for f/u. Has been on combivent prn + Advair bid. Using proventil, not as beneficial as the ventolin. Remains on loratadine + fluticasone. Also remains on omeprazole. No AE since last visit, although he did take some spare prednisone that he had last month. No abx. He is interested in entering a clinical trial at [facility].   ROV 12/11/11 -- COPD. Returns for f/u. Has been on combivent prn + Advair bid. He had stopped smoking x 2 weeks but then restarted a few days ago, currently on 2-3 a day. He had spirometry as part of clinical trial at [facility] 12/05/11, FEV1 stable at 1.61L (43% pred), ratio 52%. Uses SABA 4-6 x a day. Planning to quit tobacco at [facility]. No AE since last visit.   ROV 03/19/12 -- COPD. Returns for f/u. Has been on combivent prn + Advair bid. He was able to quit tobacco x 10 weeks! He had a slip-up over the last week, but is trying to quit again. He has a quit-coach through the [facility] quit line. He is having some drainage and cough for the last 2 days. No known URI  exposure. Uses combivent more freq than prescribed.  >>no changes   04/08/2012 Acute OV  Complains of prod cough with green mucus, wheezing, increased SOB, cold sweats x10days, will finish abx and pred taper today Called in abx via telephone message on 3/10 CXR today shows no acute changes  Ran out of atrovent nebs. Only has albuterol nebs.  No smoking since last ov.  NO hemoptysis , chest pain , or edema.    ROS:  Constitutional:   No  weight loss, night sweats,  Fevers, chills,  +fatigue, or  lassitude.  HEENT:   No headaches,  Difficulty swallowing,  Tooth/dental problems, or  Sore throat,                No sneezing, itching, ear ache,  +nasal congestion, post nasal drip,   CV:  No chest pain,  Orthopnea, PND, swelling in lower extremities, anasarca, dizziness, palpitations, syncope.   GI  No heartburn, indigestion, abdominal pain, nausea, vomiting, diarrhea, change in bowel habits, loss of appetite, bloody stools.   Resp:   No chest wall deformity  Skin: no rash or lesions.  GU: no dysuria, change in color of urine, no urgency or frequency.  No flank pain, no hematuria   MS:  No joint pain or swelling.  No decreased range of  motion.  No back pain.  Psych:  No change in mood or affect. No depression or anxiety.  No memory loss.         Objective:   Physical Exam   Gen: Pleasant, well-nourished, in no distress  ENT: No lesions,  mouth clear,  oropharynx clear, no postnasal drip,   Neck: No JVD, no TMG, no carotid bruits  Lungs: scattered rhonchi with few exp wheezes   Cardiovascular: RRR, heart sounds normal, no murmur or gallops, no peripheral edema  Musculoskeletal: No deformities, no cyanosis or clubbing  Neuro: alert, non focal  Skin: Warm, no lesions or rashes     Assessment & Plan:

## 2012-04-08 NOTE — Assessment & Plan Note (Signed)
Slow to resolve exacerbation  cXR today with no acute changes  xopenex neb in office today   Plan  Extend Doxycycline 100mg  for additional 3 days  Prednisone taper over next week Mucinex DM Twice daily  As needed  Cough/congestion  Hydromet 1-2 tsp every 4-6 hr. As needed  Cough, may make you sleepy.  Please contact office for sooner follow up if symptoms do not improve or worsen or seek emergency care  follow up Dr. Delton Coombes  As planned and As needed

## 2012-04-08 NOTE — Patient Instructions (Addendum)
Extend Doxycycline 100mg  for additional 3 days  Prednisone taper over next week Mucinex DM Twice daily  As needed  Cough/congestion  Hydromet 1-2 tsp every 4-6 hr. As needed  Cough, may make you sleepy.  Please contact office for sooner follow up if symptoms do not improve or worsen or seek emergency care  follow up Dr. Delton Coombes  As planned and As needed

## 2012-04-08 NOTE — Addendum Note (Signed)
Addended by: Boone Master E on: 04/08/2012 05:01 PM   Modules accepted: Orders

## 2012-04-09 ENCOUNTER — Telehealth: Payer: Self-pay | Admitting: Emergency Medicine

## 2012-04-09 NOTE — Telephone Encounter (Signed)
Called [employer].  Id #: [id number] [employer] This was approved TODAY - 01/22/13. Pt is aware nothing further was needed Approval # [id number]

## 2012-04-17 ENCOUNTER — Encounter
Payer: Medicare Other | Attending: Physical Medicine and Rehabilitation | Admitting: Physical Medicine and Rehabilitation

## 2012-04-17 ENCOUNTER — Encounter: Payer: Self-pay | Admitting: Physical Medicine and Rehabilitation

## 2012-04-17 VITALS — BP 160/89 | HR 95 | Resp 16 | Ht 68.0 in | Wt 219.0 lb

## 2012-04-17 DIAGNOSIS — M5137 Other intervertebral disc degeneration, lumbosacral region: Secondary | ICD-10-CM | POA: Insufficient documentation

## 2012-04-17 DIAGNOSIS — M47812 Spondylosis without myelopathy or radiculopathy, cervical region: Secondary | ICD-10-CM

## 2012-04-17 DIAGNOSIS — G8929 Other chronic pain: Secondary | ICD-10-CM | POA: Insufficient documentation

## 2012-04-17 DIAGNOSIS — M542 Cervicalgia: Secondary | ICD-10-CM | POA: Insufficient documentation

## 2012-04-17 DIAGNOSIS — Z981 Arthrodesis status: Secondary | ICD-10-CM | POA: Insufficient documentation

## 2012-04-17 DIAGNOSIS — M4712 Other spondylosis with myelopathy, cervical region: Secondary | ICD-10-CM | POA: Insufficient documentation

## 2012-04-17 DIAGNOSIS — M545 Low back pain, unspecified: Secondary | ICD-10-CM | POA: Insufficient documentation

## 2012-04-17 DIAGNOSIS — M51379 Other intervertebral disc degeneration, lumbosacral region without mention of lumbar back pain or lower extremity pain: Secondary | ICD-10-CM | POA: Insufficient documentation

## 2012-04-17 DIAGNOSIS — M48061 Spinal stenosis, lumbar region without neurogenic claudication: Secondary | ICD-10-CM | POA: Insufficient documentation

## 2012-04-17 DIAGNOSIS — J438 Other emphysema: Secondary | ICD-10-CM | POA: Insufficient documentation

## 2012-04-17 DIAGNOSIS — E119 Type 2 diabetes mellitus without complications: Secondary | ICD-10-CM | POA: Insufficient documentation

## 2012-04-17 DIAGNOSIS — K219 Gastro-esophageal reflux disease without esophagitis: Secondary | ICD-10-CM | POA: Insufficient documentation

## 2012-04-17 DIAGNOSIS — M47817 Spondylosis without myelopathy or radiculopathy, lumbosacral region: Secondary | ICD-10-CM

## 2012-04-17 DIAGNOSIS — F17200 Nicotine dependence, unspecified, uncomplicated: Secondary | ICD-10-CM | POA: Insufficient documentation

## 2012-04-17 MED ORDER — DIAZEPAM 5 MG PO TABS: 5.0000 mg | ORAL_TABLET | Freq: Three times a day (TID) | ORAL | Status: DC

## 2012-04-17 MED ORDER — OXYCODONE-ACETAMINOPHEN 7.5-325 MG PO TABS: 1.0000 | ORAL_TABLET | Freq: Four times a day (QID) | ORAL | Status: DC

## 2012-04-17 NOTE — Progress Notes (Signed)
Subjective:    Patient ID: Scott [name], male    DOB: 1960-04-09, 52 y.o.   MRN: [id number]  HPI The patient complains about chronic neck and low back pain which radiates into his shoulders bilateral.  The neck pain has been pretty stable. He also reports that his range of motion in his neck has improved since he is doing physical therapy.He reports that his pulmonologist advised him to walk more.The patient reports, that he had to pick up a heavy back 2 month ago, and since then he has LBP, which radiates into his LE bilateral, in a L5 distribution. The prednisone dose pack has helped some. He states, that he has quitt smoking with the help pulmonologist.He reports that his breathing is much better. He is doing much better today, he states, that he can walk for much longer now, because he quit smoking, which helps him with his back pain. He reports that he had a colonoscopy on March 19th, where they found 4 polyps, which were removed and not cancerous.  Pain Inventory Average Pain 10 Pain Right Now 8 My pain is sharp, burning, stabbing and tingling  In the last 24 hours, has pain interfered with the following? General activity 10 Relation with others 10 Enjoyment of life 10 What TIME of day is your pain at its worst? all the time Sleep (in general) Poor  Pain is worse with: walking, bending, sitting and standing Pain improves with: rest and medication Relief from Meds: 8  Mobility walk without assistance use a cane how many minutes can you walk? 5-7 ability to climb steps?  no do you drive?  no use a wheelchair Do you have any goals in this area?  no  Function disabled: date disabled 09 I need assistance with the following:  dressing, bathing, meal prep, household duties and shopping Do you have any goals in this area?  no  Neuro/Psych weakness numbness tingling trouble walking dizziness depression anxiety loss of taste or smell  Prior Studies Any changes since  last visit?  no  Physicians involved in your care Dr Parke Simmers   Family History  Problem Relation Age of Onset  . Emphysema Mother   . COPD Mother   . Asthma Brother   . Asthma Sister   . Heart disease Maternal Uncle    History   Social History  . Marital Status: Single    Spouse Name: N/A    Number of Children: N/A  . Years of Education: N/A   Occupational History  . DISABLED    Social History Main Topics  . Smoking status: Current Some Day Smoker -- 0.30 packs/day    Types: Cigarettes  . Smokeless tobacco: Never Used     Comment: started smoking 10wks ago-- 1 cigs per day 03/08/12  . Alcohol Use: No  . Drug Use: No  . Sexually Active: None   Other Topics Concern  . None   Social History Narrative  . None   Past Surgical History  Procedure Laterality Date  . Tonsillectomy    . Other surgical history      surgery on cheekbone and head for fall 2000  . 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 Multiple tooth extractions    . Anterior cervical decomp/discectomy fusion  12/22/2010    Procedure: ANTERIOR CERVICAL DECOMPRESSION/DISCECTOMY FUSION 3 LEVELS;  Surgeon:  Cristi Loron;  Location: [facility];  Service: Neurosurgery;  Laterality: N/A;  Anterior cervical discectomy with fusion cervical three-four, four-five, and five sixCDF with Interbody Prosthesis, plating, and Bone Graft    Past Medical History  Diagnosis Date  . COPD (chronic obstructive pulmonary disease)   . GERD (gastroesophageal reflux disease)   . Headache   . 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 Diabetes mellitus without complication    BP 160/89  Pulse 95  Resp 16  Ht 5\' 8"  (1.727 m)  Wt 219 lb (99.338 kg)  BMI 33.31 kg/m2  SpO2 92%     Review of Systems  Constitutional: Positive for diaphoresis, appetite change and unexpected weight change.  HENT: Positive  for neck pain.   Respiratory: Positive for choking, shortness of breath and wheezing.   Musculoskeletal: Positive for back pain and gait problem.  Neurological: Positive for dizziness, weakness and numbness.  Psychiatric/Behavioral: Positive for dysphoric mood. The patient is nervous/anxious.   All other systems reviewed and are negative.       Objective:   Physical Exam Constitutional: He is oriented to person, place, and time. He appears well-developed and well-nourished.  HENT:  Head: Normocephalic.  Pulmonary/Chest: He is in no distress. Musculoskeletal: He exhibits tenderness. Low back, paraspinal bilateral, worse on the right. Neurological: He is alert and oriented to person, place, and time.  Skin: Skin is warm and dry.  Psychiatric: He has a normal mood and affect.  Symmetric normal motor tone is noted throughout. Normal muscle bulk. Muscle testing reveals 4/5 muscle strength of the upper extremity, and 4-/5 of the lower extremity. Full range of motion in upper and lower extremities. ROM of C-spine is severely restricted, but has improved since last visit.  DTR in the upper and lower extremity are present and symmetric 3+. No clonus is noted.  Patient arises from chair with some difficulty. Wide based gait with a limp, and deviation of spine to the right.  SLR positive on the right.        Assessment & Plan:  This is a 52 year old male with  1. Cervical spondylosis with myelopathy  2. Status post ACDF C3 to C6  3. 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 Neck pain  5. low back pain,with radiating into the LEs bilateral in L5 distribution, exacerbation s/p lifting heavy . Ordered MRI of L-spine,MRI was stable compared to his last one, except at L4-5, there is a mildly progressive annular disc bulging, resulting in mildly progressive central and lateral recess stenosis bilaterally. Patient does not want ESI, they have not helped him in the past. He states, that he would  like an appointment with Neurosurgery in March or April, because he already has 3 doctor appointments next month, and his back is doing better this time. He wants to wait until then, for possible further treatment options. MRI and further treatment options were discussed in length.  6. Emphysema, COPD  Plan  Advised patient to continue with resting applying heat and /or ice, trying to walk some if pain allows.  Refilled his Percocet, percocet was increased from 4 1/2 to 5 per day today.Discussed with patient that this is not long term, just until he sees neurosurgeon for other options. Also refilled his Valium, which gives him good relief from his muscle spasms.  Patient got a card from his insurance to use the [employer] program, I recommended exercising  in the pool. The patient will have a colonoscopy in March, therefore he can not effort to see another specialist , neurosurgeon next month, if his LBP is worse he might consider seeing one in April.  Follow up in with PA in 1 month.

## 2012-04-17 NOTE — Patient Instructions (Signed)
[facility] [employer].

## 2012-04-22 ENCOUNTER — Telehealth: Payer: Self-pay | Admitting: Emergency Medicine

## 2012-04-22 MED ORDER — IPRATROPIUM-ALBUTEROL 0.5-2.5 (3) MG/3ML IN SOLN: 3.0000 mL | Freq: Four times a day (QID) | RESPIRATORY_TRACT | Status: DC | PRN

## 2012-04-22 NOTE — Telephone Encounter (Signed)
I spoke with Cicero Duck and advised her pt to receive Proventil rescue inhaler instead of proair. She stated she is aware and this is scheduled to be shipped out to pt. Also gave VO for pt duoneb and stopped order for pt albuterol neb. Pt is aware.

## 2012-04-24 ENCOUNTER — Other Ambulatory Visit: Payer: Self-pay | Admitting: Emergency Medicine

## 2012-04-24 MED ORDER — ALBUTEROL SULFATE HFA 108 (90 BASE) MCG/ACT IN AERS: 2.0000 | INHALATION_SPRAY | RESPIRATORY_TRACT | Status: DC | PRN

## 2012-04-24 NOTE — Telephone Encounter (Signed)
Prior Auth approved through 01/22/13 for Proventil HFA Patient ID# [id number] Rx sent to pharmacy.

## 2012-04-29 ENCOUNTER — Telehealth: Payer: Self-pay | Admitting: Emergency Medicine

## 2012-04-29 NOTE — Telephone Encounter (Signed)
I called [facility] and they advised that they have Proventil ready to be shipped , NOT proair. I called the pt to advise and he states that the pharmacy called him today and advised that they just received a new RX for proair this morning. It looks like rx was sent to [facility] on 04-24-12 so I called them and they had received the rx so I cancelled this RX because the pt gets his meds through [facility]. I also deleted [facility] from pt list Pt is aware. Carron Curie, CMA

## 2012-05-15 ENCOUNTER — Encounter
Payer: Medicare Other | Attending: Physical Medicine and Rehabilitation | Admitting: Physical Medicine and Rehabilitation

## 2012-05-15 ENCOUNTER — Encounter: Payer: Self-pay | Admitting: Physical Medicine and Rehabilitation

## 2012-05-15 VITALS — BP 149/96 | HR 94 | Resp 14 | Wt 220.0 lb

## 2012-05-15 DIAGNOSIS — M47812 Spondylosis without myelopathy or radiculopathy, cervical region: Secondary | ICD-10-CM | POA: Insufficient documentation

## 2012-05-15 DIAGNOSIS — M961 Postlaminectomy syndrome, not elsewhere classified: Secondary | ICD-10-CM | POA: Insufficient documentation

## 2012-05-15 DIAGNOSIS — M47817 Spondylosis without myelopathy or radiculopathy, lumbosacral region: Secondary | ICD-10-CM | POA: Insufficient documentation

## 2012-05-15 MED ORDER — OXYCODONE-ACETAMINOPHEN 7.5-325 MG PO TABS: 1.0000 | ORAL_TABLET | Freq: Four times a day (QID) | ORAL | Status: DC

## 2012-05-15 MED ORDER — MELOXICAM 15 MG PO TABS: 15.0000 mg | ORAL_TABLET | Freq: Every day | ORAL | Status: DC

## 2012-05-15 MED ORDER — DIAZEPAM 5 MG PO TABS: 5.0000 mg | ORAL_TABLET | Freq: Three times a day (TID) | ORAL | Status: DC

## 2012-05-15 NOTE — Progress Notes (Signed)
Subjective:    Patient ID: Scott [name], male    DOB: [birth year], 52 y.o.   MRN: [id number]  HPI The patient complains about chronic neck and low back pain .  The neck pain has gotten worse. He reports that his range of motion in his neck has decreased again, because of pain mainly on the left side of his neck, radiating into his left arm in a C7 distribution. The patient reports, that he had to pick up a heavy back a few month ago, and since then he has LBP, which radiates into his LE bilateral, in a L5 distribution. The prednisone dose pack has helped some. He states, that he has quitt smoking with the help of his pulmonologist.He reports that his breathing is much better. He is doing much better today, he states, that he can walk for much longer now, because he quit smoking, which helps him with his back pain.  He reports that he had a colonoscopy on March 19th, where they found 4 polyps, which were removed and not cancerous.   Pain Inventory Average Pain 10 Pain Right Now 8 My pain is sharp, burning, dull, stabbing and tingling  In the last 24 hours, has pain interfered with the following? General activity 10 Relation with others 10 Enjoyment of life 10 What TIME of day is your pain at its worst? all Sleep (in general) Poor  Pain is worse with: walking, bending, sitting, standing and some activites Pain improves with: rest and medication Relief from Meds: 8  Mobility use a cane ability to climb steps?  no do you drive?  no  Function disabled: date disabled 11/09 I need assistance with the following:  dressing, bathing, meal prep, household duties and shopping  Neuro/Psych weakness numbness tingling trouble walking dizziness depression loss of taste or smell  Prior Studies Any changes since last visit?  no  Physicians involved in your care Any changes since last visit?  no   Family History  Problem Relation Age of Onset  . Emphysema Mother   . COPD Mother    . Asthma Brother   . Asthma Sister   . Heart disease Maternal Uncle    History   Social History  . Marital Status: Single    Spouse Name: N/A    Number of Children: N/A  . Years of Education: N/A   Occupational History  . DISABLED    Social History Main Topics  . Smoking status: Former Smoker -- 0.30 packs/day    Types: Cigarettes    Start date: 03/17/2012  . Smokeless tobacco: Never Used     Comment: started smoking 10wks ago-- 1 cigs per day 03/08/12  . Alcohol Use: No  . Drug Use: No  . Sexually Active: None   Other Topics Concern  . None   Social History Narrative  . None   Past Surgical History  Procedure Laterality Date  . Tonsillectomy    . Other surgical history      surgery on cheekbone and head for fall 2000  . 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 Multiple tooth extractions    . Anterior cervical decomp/discectomy fusion  12/22/2010    Procedure: ANTERIOR CERVICAL DECOMPRESSION/DISCECTOMY FUSION 3 LEVELS;  Surgeon: Cristi Loron;  Location: [facility];  Service: Neurosurgery;  Laterality: N/A;  Anterior cervical discectomy with  fusion cervical three-four, four-five, and five sixCDF with Interbody Prosthesis, plating, and Bone Graft    Past Medical History  Diagnosis Date  . COPD (chronic obstructive pulmonary disease)   . GERD (gastroesophageal reflux disease)   . Headache   . 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 Diabetes mellitus without complication    BP 149/96  Pulse 94  Resp 14  Wt 220 lb (99.791 kg)  BMI 33.46 kg/m2  SpO2 91%   Review of Systems  HENT: Positive for neck pain.   Respiratory: Positive for shortness of breath and wheezing.   Cardiovascular: Positive for leg swelling.  Musculoskeletal: Positive for back pain and gait problem.  Neurological: Positive for dizziness and weakness.        Tingling  Psychiatric/Behavioral: Positive for dysphoric mood.  All other systems reviewed and are negative.       Objective:   Physical Exam Constitutional: He is oriented to person, place, and time. He appears well-developed and well-nourished.  HENT:  Head: Normocephalic.  Pulmonary/Chest: He is in no distress. Musculoskeletal: He exhibits tenderness. Low back, paraspinal bilateral, worse on the right. Neurological: He is alert and oriented to person, place, and time.  Skin: Skin is warm and dry.  Psychiatric: He has a normal mood and affect.  Symmetric normal motor tone is noted throughout. Normal muscle bulk. Muscle testing reveals 5/5 muscle strength of the upper extremity except left triceps 4/5, and 4-/5 of the right lower extremity. Full range of motion in upper and lower extremities. ROM of C-spine is severely restricted,in all directions, worse in extension.  DTR in the upper and lower extremity are present and symmetric 3+. No clonus is noted.  Patient arises from chair with some difficulty. Wide based gait with a limp, and deviation of spine to the right.  SLR positive on the right.        Assessment & Plan:  This is a 52 year old male with  1. Cervical spondylosis with myelopathy , exacerbation, mainly C7 distribution 2. Status post ACDF C3 to C6  3. 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 Neck pain  5. low back pain,with radiating into the LEs bilateral in L5 distribution, exacerbation s/p lifting heavy . Ordered MRI of L-spine,MRI was stable compared to his last one, except at L4-5, there is a mildly progressive annular disc bulging, resulting in mildly progressive central and lateral recess stenosis bilaterally. Patient does not want ESI, they have not helped him in the past.  6. Emphysema, COPD  Plan  Referral to his neurosurgeon Dr. Lovell Sheehan Prescribed Mobic for his exacerbated symptoms Advised patient to continue with resting applying heat and /or ice,  trying to walk some if pain allows.  Refilled his Percocet, percocet was increased from 4 1/2 to 5 per day today.Discussed with patient that this is not long term, just until he sees neurosurgeon for other options. Also refilled his Valium, which gives him good relief from his muscle spasms.  Patient got a card from his insurance to use the [employer] program, I recommended exercising in the pool.  Follow up in with PA in 1 month.

## 2012-05-15 NOTE — Patient Instructions (Signed)
Try to rest , apply heat to your neck and back .

## 2012-05-21 ENCOUNTER — Telehealth: Payer: Self-pay | Admitting: Emergency Medicine

## 2012-05-21 MED ORDER — NICOTINE 7 MG/24HR TD PT24: 1.0000 | MEDICATED_PATCH | TRANSDERMAL | Status: DC

## 2012-05-21 NOTE — Telephone Encounter (Signed)
Ok to refill. He was called and instructions/suppoert were given for cig cessation

## 2012-05-21 NOTE — Telephone Encounter (Signed)
Dr. Delton Coombes-- received fax for refill on nicotine patch 7mg /24hr pat -- apply 1 patch onto skin every day, remove old patch daily and rotate sites on skin. Would you like to fill this?  Last OV; 04/08/12 w TP follow up as needed  Pharmacy: [facility]

## 2012-05-21 NOTE — Telephone Encounter (Signed)
Rx sent 

## 2012-06-12 ENCOUNTER — Encounter: Payer: Self-pay | Admitting: Physical Medicine and Rehabilitation

## 2012-06-12 ENCOUNTER — Encounter
Payer: Medicare Other | Attending: Physical Medicine and Rehabilitation | Admitting: Physical Medicine and Rehabilitation

## 2012-06-12 VITALS — BP 122/89 | HR 95 | Resp 16 | Ht 68.0 in | Wt 219.0 lb

## 2012-06-12 DIAGNOSIS — M47817 Spondylosis without myelopathy or radiculopathy, lumbosacral region: Secondary | ICD-10-CM

## 2012-06-12 DIAGNOSIS — M4712 Other spondylosis with myelopathy, cervical region: Secondary | ICD-10-CM | POA: Insufficient documentation

## 2012-06-12 DIAGNOSIS — M51379 Other intervertebral disc degeneration, lumbosacral region without mention of lumbar back pain or lower extremity pain: Secondary | ICD-10-CM | POA: Insufficient documentation

## 2012-06-12 DIAGNOSIS — J449 Chronic obstructive pulmonary disease, unspecified: Secondary | ICD-10-CM | POA: Insufficient documentation

## 2012-06-12 DIAGNOSIS — J4489 Other specified chronic obstructive pulmonary disease: Secondary | ICD-10-CM | POA: Insufficient documentation

## 2012-06-12 DIAGNOSIS — M5137 Other intervertebral disc degeneration, lumbosacral region: Secondary | ICD-10-CM | POA: Insufficient documentation

## 2012-06-12 DIAGNOSIS — M79609 Pain in unspecified limb: Secondary | ICD-10-CM | POA: Insufficient documentation

## 2012-06-12 DIAGNOSIS — G8929 Other chronic pain: Secondary | ICD-10-CM | POA: Insufficient documentation

## 2012-06-12 DIAGNOSIS — Z981 Arthrodesis status: Secondary | ICD-10-CM | POA: Insufficient documentation

## 2012-06-12 DIAGNOSIS — M47812 Spondylosis without myelopathy or radiculopathy, cervical region: Secondary | ICD-10-CM

## 2012-06-12 DIAGNOSIS — M961 Postlaminectomy syndrome, not elsewhere classified: Secondary | ICD-10-CM

## 2012-06-12 MED ORDER — OXYCODONE-ACETAMINOPHEN 7.5-325 MG PO TABS: 1.0000 | ORAL_TABLET | Freq: Four times a day (QID) | ORAL | Status: DC

## 2012-06-12 MED ORDER — DIAZEPAM 5 MG PO TABS: 5.0000 mg | ORAL_TABLET | Freq: Three times a day (TID) | ORAL | Status: DC

## 2012-06-12 NOTE — Patient Instructions (Signed)
Follow up with your neurosurgeon

## 2012-06-12 NOTE — Progress Notes (Signed)
Subjective:    Patient ID: Scott [name], male    DOB: [birth year], 52 y.o.   MRN: [id number]  HPI The patient complains about chronic neck and low back pain .  The neck pain has gotten worse. He reports that his range of motion in his neck has decreased again, because of pain mainly on the left side of his neck, radiating into his left arm in a C6 and C7 distribution. The patient reports, that he had to pick up a heavy back a few month ago, and since then he has LBP, which radiates into his LE bilateral, in a L5 distribution. The prednisone dose pack has helped some. He states, that he has quitt smoking with the help of his pulmonologist.He reports that his breathing is much better. He is doing much better today, he states, that he can walk for much longer now, because he quit smoking, which helps him with his back pain.  He reports that he had a colonoscopy on March 19th, where they found 4 polyps, which were removed and not cancerous. He reports that he will see Dr. Lovell Sheehan, his neurosurgeon on May 30th, 2014  Pain Inventory Average Pain 10 Pain Right Now 8 My pain is sharp, burning, dull, stabbing and tingling  In the last 24 hours, has pain interfered with the following? General activity 10 Relation with others 10 Enjoyment of life 10 What TIME of day is your pain at its worst? all the time Sleep (in general) Poor  Pain is worse with: walking, bending and standing Pain improves with: rest and medication Relief from Meds: 8  Mobility walk without assistance use a cane ability to climb steps?  no do you drive?  no use a wheelchair transfers alone Do you have any goals in this area?  yes  Function disabled: date disabled 2009 I need assistance with the following:  dressing, bathing, meal prep, household duties and shopping Do you have any goals in this area?  yes  Neuro/Psych weakness numbness tingling trouble walking dizziness anxiety loss of taste or smell  Prior  Studies x-rays CT/MRI  Physicians involved in your care Any changes since last visit?  no   Family History  Problem Relation Age of Onset  . Emphysema Mother   . COPD Mother   . Asthma Brother   . Asthma Sister   . Heart disease Maternal Uncle    History   Social History  . Marital Status: Single    Spouse Name: N/A    Number of Children: N/A  . Years of Education: N/A   Occupational History  . DISABLED    Social History Main Topics  . Smoking status: Current Some Day Smoker -- 0.30 packs/day    Types: Cigarettes    Start date: 03/17/2012  . Smokeless tobacco: Never Used     Comment: started smoking 10wks ago-- 1 cigs per day 03/08/12  . Alcohol Use: No  . Drug Use: No  . Sexually Active: None   Other Topics Concern  . None   Social History Narrative  . None   Past Surgical History  Procedure Laterality Date  . Tonsillectomy    . Other surgical history      surgery on cheekbone and head for fall 2000  . 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 Multiple tooth extractions    .  Anterior cervical decomp/discectomy fusion  12/22/2010    Procedure: ANTERIOR CERVICAL DECOMPRESSION/DISCECTOMY FUSION 3 LEVELS;  Surgeon: Cristi Loron;  Location: [facility];  Service: Neurosurgery;  Laterality: N/A;  Anterior cervical discectomy with fusion cervical three-four, four-five, and five sixCDF with Interbody Prosthesis, plating, and Bone Graft    Past Medical History  Diagnosis Date  . COPD (chronic obstructive pulmonary disease)   . GERD (gastroesophageal reflux disease)   . Headache   . 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 Diabetes mellitus without complication    BP 122/89  Pulse 95  Resp 16  Ht 5\' 8"  (1.727 m)  Wt 219 lb (99.338 kg)  BMI 33.31 kg/m2  SpO2 94%     Review of Systems  Constitutional: Positive for diaphoresis,  appetite change and unexpected weight change.  Respiratory: Positive for cough.   Musculoskeletal: Positive for gait problem.  Neurological: Positive for weakness and numbness.  Psychiatric/Behavioral: The patient is nervous/anxious.   All other systems reviewed and are negative.       Objective:   Physical Exam Constitutional: He is oriented to person, place, and time. He appears well-developed and well-nourished.  HENT:  Head: Normocephalic.  Pulmonary/Chest: He is in no distress. Musculoskeletal: He exhibits tenderness. Low back, paraspinal bilateral, worse on the right. Neurological: He is alert and oriented to person, place, and time.  Skin: Skin is warm and dry.  Psychiatric: He has a normal mood and affect.  Symmetric normal motor tone is noted throughout. Normal muscle bulk. Muscle testing reveals 5/5 muscle strength of the upper extremity except left triceps 4/5, and 4-/5 of the right lower extremity. Full range of motion in upper and lower extremities. ROM of C-spine is severely restricted,in all directions, worse in extension.  DTR in the upper and lower extremity are present and symmetric 3+. No clonus is noted.  Patient arises from chair with some difficulty. Wide based gait with a limp, and deviation of spine to the right.  SLR positive on the right.        Assessment & Plan:  This is a 52 year old male with  1. Cervical spondylosis with myelopathy , exacerbation, mainly C7 distribution  2. Status post ACDF C3 to C6  3. 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 Neck pain  5. low back pain,with radiating into the LEs bilateral in L5 distribution, exacerbation s/p lifting heavy . Ordered MRI of L-spine,MRI was stable compared to his last one, except at L4-5, there is a mildly progressive annular disc bulging, resulting in mildly progressive central and lateral recess stenosis bilaterally. Patient does not want ESI, they have not helped him in the past.  6.  Emphysema, COPD  Plan  Referral to his neurosurgeon Dr. Lovell Sheehan he will see him on May the 30th, 2014. Prescribed Mobic for his exacerbated symptoms  Advised patient to continue with resting applying heat and /or ice, trying to walk some if pain allows.  Refilled his Percocet, percocet was increased from 4 1/2 to 5 per day .Discussed with patient that this is not long term, just until he sees neurosurgeon for other options. Also refilled his Valium, which gives him good relief from his muscle spasms.  Patient got a card from his insurance to use the [employer] program, I recommended exercising in the pool as pain permits.  Follow up  with PA in 1 month.

## 2012-06-13 ENCOUNTER — Other Ambulatory Visit: Payer: Self-pay | Admitting: Adult Health

## 2012-06-13 NOTE — Telephone Encounter (Signed)
Last ov w/ TP 3.17.14: Patient Instructions    Extend Doxycycline 100mg  for additional 3 days  Prednisone taper over next week  Mucinex DM Twice daily As needed Cough/congestion  Hydromet 1-2 tsp every 4-6 hr. As needed Cough, may make you sleepy.  Please contact office for sooner follow up if symptoms do not improve or worsen or seek emergency care  follow up Dr. Delton Coombes As planned and As needed    Received faxed refill request from [facility] for 6 months' worth of refills on the prednisone 10mg  taper (4 tabs x2 days, 3x2, 2x2, 1x2 and stop).  Rx was faxed back to pharmacy w/ denial >> "Denied!  This is a short-term rx for acute symptoms.  Pt must contact office." Faxed back to [phone] Sent to scan Will sign off

## 2012-06-21 ENCOUNTER — Ambulatory Visit (INDEPENDENT_AMBULATORY_CARE_PROVIDER_SITE_OTHER): Payer: Medicare Other | Admitting: Emergency Medicine

## 2012-06-21 ENCOUNTER — Encounter: Payer: Self-pay | Admitting: Emergency Medicine

## 2012-06-21 VITALS — BP 116/80 | HR 110 | Temp 99.2°F | Ht 67.5 in | Wt 220.4 lb

## 2012-06-21 DIAGNOSIS — J449 Chronic obstructive pulmonary disease, unspecified: Secondary | ICD-10-CM

## 2012-06-21 DIAGNOSIS — J309 Allergic rhinitis, unspecified: Secondary | ICD-10-CM

## 2012-06-21 MED ORDER — LORATADINE 10 MG PO TABS: 10.0000 mg | ORAL_TABLET | Freq: Every day | ORAL | Status: DC

## 2012-06-21 MED ORDER — FLUTICASONE PROPIONATE 50 MCG/ACT NA SUSP: 2.0000 | Freq: Two times a day (BID) | NASAL | Status: DC

## 2012-06-21 NOTE — Progress Notes (Signed)
Subjective:    Patient ID: Scott [name], male    DOB: 1960-04-04, 52 y.o.   MRN: [id number]  HPI 51 yo man, tobacco user (smoking 1/2 pk/day), hx of COPD in a study at [facility]. Has had PFT at [facility] and ? At [facility] as well. Now following with Dr Reche Dixon at [facility]. He has been having increased combivent use for SOB. Coughs every day, has been worse since anterior cervical fusion in November. Sputum is clear to yellow.  He is also on Advair.  He has bad GERD, better w omeprazole. Has allergy sx, on no meds.   ROV 07/11/11 -- COPD, still smoking 5 cig a day. Tells me that his breathing is the same to worse. Taking Advair bid, combivent ordered qid but he uses prn. Has nicotine patches but not using. Discussed cigarette cessation in detail today. Talked about setting a quit date Tells me that he benefited from the loratadine and fluticasone but stopped taking.   ROV 09/12/11 -- COPD, still smoking 2-3 cig a day. Returns for f/u. Has been on combivent prn + Advair bid. Using proventil, not as beneficial as the ventolin. Remains on loratadine + fluticasone. Also remains on omeprazole. No AE since last visit, although he did take some spare prednisone that he had last month. No abx. He is interested in entering a clinical trial at [facility].   ROV 12/11/11 -- COPD. Returns for f/u. Has been on combivent prn + Advair bid. He had stopped smoking x 2 weeks but then restarted a few days ago, currently on 2-3 a day. He had spirometry as part of clinical trial at [facility] 12/05/11, FEV1 stable at 1.61L (43% pred), ratio 52%. Uses SABA 4-6 x a day. Planning to quit tobacco at [facility]. No AE since last visit.   ROV 03/19/12 -- COPD. Returns for f/u. Has been on combivent prn + Advair bid. He was able to quit tobacco x 10 weeks! He had a slip-up over the last week, but is trying to quit again. He has a quit-coach through the [facility] quit line. He is having some drainage and cough for the last 2 days. No known URI  exposure. Uses combivent more freq than prescribed.  >>no changes   Acute OV 04/08/12 --  Complains of prod cough with green mucus, wheezing, increased SOB, cold sweats x10days, will finish abx and pred taper today Called in abx via telephone message on 3/10 CXR today shows no acute changes  Ran out of atrovent nebs. Only has albuterol nebs.  No smoking since last ov.  NO hemoptysis , chest pain , or edema.   ROV 06/21/12 -- COPD, allergies, GERD, cough. Was seen 3/17 for bronchitis, was treated with pred + abx. He improved. For the last 4-6 weeks has been dealing with more cough and mucous, has been out of loratadine and fluticasone for months. He is on combivent + Advair. He didn't have any albuterol so he started using combivent prn.     Objective:   Physical Exam  Filed Vitals:   06/21/12 1342  BP: 116/80  Pulse: 110  Temp: 99.2 F (37.3 C)  TempSrc: Oral  Height: 5' 7.5" (1.715 m)  Weight: 220 lb 6.4 oz (99.973 kg)  SpO2: 91%   Gen: Pleasant, well-nourished, in no distress  ENT: No lesions,  mouth clear,  oropharynx clear, no postnasal drip,   Neck: No JVD, no TMG, no carotid bruits  Lungs: scattered rhonchi with few exp wheezes, loud wheeze  on a forced exp  Cardiovascular: RRR, heart sounds normal, no murmur or gallops, no peripheral edema  Musculoskeletal: No deformities, no cyanosis or clubbing  Neuro: alert, non focal  Skin: Warm, no lesions or rashes     Assessment & Plan:  COPD (chronic obstructive pulmonary disease) Do not believe that he has an AE right now, but clearly below his baseline, probably due to poor allergy control. Will get his scripts up to date. Give sample albuterol so he can stop using combivent prn.  - same BD regimen  Allergic rhinitis - restart and refill loratadine and fluticasone nasal spray

## 2012-06-21 NOTE — Patient Instructions (Addendum)
Please continue Advair twice a day and combivent 4 times a day Use albuterol (either Proventil or Ventolin) as needed Restart loratadine daily and fluticasone nasal spray 2 sprays each side twice a day Follow with Dr Delton Coombes in 3 months or sooner if you have any problems.

## 2012-06-21 NOTE — Assessment & Plan Note (Signed)
Do not believe that he has an AE right now, but clearly below his baseline, probably due to poor allergy control. Will get his scripts up to date. Give sample albuterol so he can stop using combivent prn.  - same BD regimen

## 2012-06-21 NOTE — Assessment & Plan Note (Signed)
-   restart and refill loratadine and fluticasone nasal spray

## 2012-06-21 NOTE — Addendum Note (Signed)
Addended by: Orma Flaming D on: 06/21/2012 02:07 PM   Modules accepted: Orders

## 2012-07-11 ENCOUNTER — Encounter
Payer: Medicare Other | Attending: Physical Medicine and Rehabilitation | Admitting: Physical Medicine and Rehabilitation

## 2012-07-11 ENCOUNTER — Encounter: Payer: Self-pay | Admitting: Physical Medicine and Rehabilitation

## 2012-07-11 VITALS — BP 151/101 | HR 102 | Resp 20 | Ht 68.0 in | Wt 219.0 lb

## 2012-07-11 DIAGNOSIS — M47812 Spondylosis without myelopathy or radiculopathy, cervical region: Secondary | ICD-10-CM

## 2012-07-11 DIAGNOSIS — J449 Chronic obstructive pulmonary disease, unspecified: Secondary | ICD-10-CM | POA: Insufficient documentation

## 2012-07-11 DIAGNOSIS — J4489 Other specified chronic obstructive pulmonary disease: Secondary | ICD-10-CM | POA: Insufficient documentation

## 2012-07-11 DIAGNOSIS — Z981 Arthrodesis status: Secondary | ICD-10-CM | POA: Insufficient documentation

## 2012-07-11 DIAGNOSIS — G8929 Other chronic pain: Secondary | ICD-10-CM | POA: Insufficient documentation

## 2012-07-11 DIAGNOSIS — M545 Low back pain, unspecified: Secondary | ICD-10-CM | POA: Insufficient documentation

## 2012-07-11 DIAGNOSIS — M542 Cervicalgia: Secondary | ICD-10-CM | POA: Insufficient documentation

## 2012-07-11 DIAGNOSIS — Z5181 Encounter for therapeutic drug level monitoring: Secondary | ICD-10-CM

## 2012-07-11 DIAGNOSIS — M79609 Pain in unspecified limb: Secondary | ICD-10-CM | POA: Insufficient documentation

## 2012-07-11 DIAGNOSIS — M5137 Other intervertebral disc degeneration, lumbosacral region: Secondary | ICD-10-CM | POA: Insufficient documentation

## 2012-07-11 DIAGNOSIS — M961 Postlaminectomy syndrome, not elsewhere classified: Secondary | ICD-10-CM

## 2012-07-11 DIAGNOSIS — Z79899 Other long term (current) drug therapy: Secondary | ICD-10-CM

## 2012-07-11 DIAGNOSIS — M51379 Other intervertebral disc degeneration, lumbosacral region without mention of lumbar back pain or lower extremity pain: Secondary | ICD-10-CM | POA: Insufficient documentation

## 2012-07-11 DIAGNOSIS — M47817 Spondylosis without myelopathy or radiculopathy, lumbosacral region: Secondary | ICD-10-CM

## 2012-07-11 DIAGNOSIS — M4712 Other spondylosis with myelopathy, cervical region: Secondary | ICD-10-CM | POA: Insufficient documentation

## 2012-07-11 MED ORDER — DIAZEPAM 5 MG PO TABS: 5.0000 mg | ORAL_TABLET | Freq: Four times a day (QID) | ORAL | Status: DC | PRN

## 2012-07-11 MED ORDER — OXYCODONE-ACETAMINOPHEN 7.5-325 MG PO TABS: 1.0000 | ORAL_TABLET | Freq: Four times a day (QID) | ORAL | Status: DC

## 2012-07-11 NOTE — Progress Notes (Signed)
Subjective:    Patient ID: Scott [name], male    DOB: 1960-10-11, 52 y.o.   MRN: [id number]  HPI The patient complains about chronic neck and low back pain .  The neck pain has gotten worse. He reports that his range of motion in his neck has decreased again, because of pain mainly on the left side of his neck, radiating into his left arm in a C6 and C7 distribution. The patient reports, that he had to pick up a heavy back a few month ago, and since then he has LBP, which radiates into his LE bilateral, in a L5 distribution. The prednisone dose pack has helped some. He states, that he has quitt smoking with the help of his pulmonologist.He reports that his breathing is much better. He is doing much better today, he states, that he can walk for much longer now, because he quit smoking, which helps him with his back pain.  He reports that he had a colonoscopy on March 19th, where they found 4 polyps, which were removed and not cancerous.  He reports that the appointment he had with Dr. Lovell Sheehan, his neurosurgeon on May 30th, 2014 was postponed twice, he now has an appointment on July 11th.  Pain Inventory Average Pain 10 Pain Right Now 8 My pain is sharp, burning, dull, stabbing and tingling  In the last 24 hours, has pain interfered with the following? General activity 10 Relation with others 10 Enjoyment of life 10 What TIME of day is your pain at its worst? constant Sleep (in general) Poor  Pain is worse with: walking, bending, sitting and standing Pain improves with: rest and medication Relief from Meds: 8  Mobility walk without assistance use a cane how many minutes can you walk? 5-7 ability to climb steps?  no do you drive?  no use a wheelchair transfers alone Do you have any goals in this area?  yes  Function disabled: date disabled 81 I need assistance with the following:  dressing, bathing, meal prep, household duties and shopping Do you have any goals in this area?   yes  Neuro/Psych weakness numbness tingling trouble walking dizziness anxiety loss of taste or smell  Prior Studies Any changes since last visit?  no  Physicians involved in your care Any changes since last visit?  no   Family History  Problem Relation Age of Onset  . Emphysema Mother   . COPD Mother   . Asthma Brother   . Asthma Sister   . Heart disease Maternal Uncle    History   Social History  . Marital Status: Single    Spouse Name: N/A    Number of Children: N/A  . Years of Education: N/A   Occupational History  . DISABLED    Social History Main Topics  . Smoking status: Former Smoker -- 0.30 packs/day for 41 years    Types: Cigarettes    Start date: 03/17/2012    Quit date: 06/07/2012  . Smokeless tobacco: Never Used     Comment: started patch 2 wks ago//06/21/12  . Alcohol Use: No  . Drug Use: No  . Sexually Active: None   Other Topics Concern  . None   Social History Narrative  . None   Past Surgical History  Procedure Laterality Date  . Tonsillectomy    . Other surgical history      surgery on cheekbone and head for fall 2000  . 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 Multiple tooth extractions    . Anterior cervical decomp/discectomy fusion  12/22/2010    Procedure: ANTERIOR CERVICAL DECOMPRESSION/DISCECTOMY FUSION 3 LEVELS;  Surgeon: Cristi Loron;  Location: [facility];  Service: Neurosurgery;  Laterality: N/A;  Anterior cervical discectomy with fusion cervical three-four, four-five, and five sixCDF with Interbody Prosthesis, plating, and Bone Graft    Past Medical History  Diagnosis Date  . COPD (chronic obstructive pulmonary disease)   . GERD (gastroesophageal reflux disease)   . Headache(784.0)   . 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 Diabetes mellitus without complication    BP 151/101  Pulse 102   Resp 20  Ht 5\' 8"  (1.727 m)  Wt 219 lb (99.338 kg)  BMI 33.31 kg/m2  SpO2 91%     Review of Systems  Constitutional: Positive for diaphoresis and unexpected weight change.  Respiratory: Positive for cough, shortness of breath and wheezing.   Musculoskeletal: Positive for gait problem.  Neurological: Positive for dizziness, weakness and numbness.  Psychiatric/Behavioral: The patient is nervous/anxious.   All other systems reviewed and are negative.       Objective:   Physical Exam Constitutional: He is oriented to person, place, and time. He appears well-developed and well-nourished.  HENT:  Head: Normocephalic.  Pulmonary/Chest: He is in no distress. Musculoskeletal: He exhibits tenderness. Low back, paraspinal bilateral, worse on the right. Neurological: He is alert and oriented to person, place, and time.  Skin: Skin is warm and dry.  Psychiatric: He has a normal mood and affect.  Symmetric normal motor tone is noted throughout. Normal muscle bulk. Muscle testing reveals 5/5 muscle strength of the upper extremity except left triceps 4/5, and 4-/5 of the right lower extremity. Full range of motion in upper and lower extremities. ROM of C-spine is severely restricted,in all directions, worse in extension.  DTR in the upper and lower extremity are present and symmetric 3+. No clonus is noted.  Patient arises from chair with some difficulty. Wide based gait with a limp, and deviation of spine to the right.  SLR positive on the right.        Assessment & Plan:  This is a 52 year old male with  1. Cervical spondylosis with myelopathy , exacerbation, mainly C7 distribution  2. Status post ACDF C3 to C6  3. 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 Neck pain  5. low back pain,with radiating into the LEs bilateral in L5 distribution, exacerbation s/p lifting heavy . Ordered MRI of L-spine,MRI was stable compared to his last one, except at L4-5, there is a mildly  progressive annular disc bulging, resulting in mildly progressive central and lateral recess stenosis bilaterally. Patient does not want ESI, they have not helped him in the past.  6. Emphysema, COPD  Plan  He reports that the appointment he had with Dr. Lovell Sheehan, his neurosurgeon on May 30th, 2014 was postponed twice, he now has an appointment on July 11th.  Continue Mobic for his exacerbated symptoms  Advised patient to continue with resting applying heat and /or ice, trying to walk some if pain allows.  Refilled his Percocet, percocet was increased from 4 1/2 to 5 per day .Discussed with patient that this is not long term, just until he sees neurosurgeon for other options. Also refilled his Valium, which  gives him good relief from his muscle spasms, he can take this q6 hrs, prescribed # 120.  Patient got a card from his insurance to use the [employer] program, I recommended exercising in the pool as pain permits, if his pulmonologist approves.  Follow up with PA in 1 month.

## 2012-07-11 NOTE — Patient Instructions (Addendum)
Follow up with Dr. Lovell Sheehan as soon as possible, try stay as active as tolerated

## 2012-08-05 ENCOUNTER — Other Ambulatory Visit: Payer: Self-pay | Admitting: Emergency Medicine

## 2012-08-05 MED ORDER — IPRATROPIUM-ALBUTEROL 0.5-2.5 (3) MG/3ML IN SOLN: 3.0000 mL | Freq: Four times a day (QID) | RESPIRATORY_TRACT | Status: DC | PRN

## 2012-08-06 ENCOUNTER — Telehealth: Payer: Self-pay | Admitting: Emergency Medicine

## 2012-08-06 MED ORDER — IPRATROPIUM-ALBUTEROL 0.5-2.5 (3) MG/3ML IN SOLN: 3.0000 mL | Freq: Four times a day (QID) | RESPIRATORY_TRACT | Status: DC | PRN

## 2012-08-06 NOTE — Telephone Encounter (Signed)
Medication was "phone in" by accident Rx sent "normal" ATC jody to inform, no answer [id number]

## 2012-08-07 NOTE — Telephone Encounter (Signed)
Jodi aware rx was sent. Carron Curie, CMA

## 2012-08-08 ENCOUNTER — Encounter: Payer: Self-pay | Admitting: Physical Medicine and Rehabilitation

## 2012-08-08 ENCOUNTER — Encounter
Payer: Medicare Other | Attending: Physical Medicine and Rehabilitation | Admitting: Physical Medicine and Rehabilitation

## 2012-08-08 VITALS — BP 148/104 | HR 94 | Resp 16 | Ht 68.0 in | Wt 217.0 lb

## 2012-08-08 DIAGNOSIS — M4712 Other spondylosis with myelopathy, cervical region: Secondary | ICD-10-CM | POA: Insufficient documentation

## 2012-08-08 DIAGNOSIS — M542 Cervicalgia: Secondary | ICD-10-CM

## 2012-08-08 DIAGNOSIS — J4489 Other specified chronic obstructive pulmonary disease: Secondary | ICD-10-CM | POA: Insufficient documentation

## 2012-08-08 DIAGNOSIS — M961 Postlaminectomy syndrome, not elsewhere classified: Secondary | ICD-10-CM

## 2012-08-08 DIAGNOSIS — M51379 Other intervertebral disc degeneration, lumbosacral region without mention of lumbar back pain or lower extremity pain: Secondary | ICD-10-CM | POA: Insufficient documentation

## 2012-08-08 DIAGNOSIS — J449 Chronic obstructive pulmonary disease, unspecified: Secondary | ICD-10-CM | POA: Insufficient documentation

## 2012-08-08 DIAGNOSIS — M545 Low back pain, unspecified: Secondary | ICD-10-CM | POA: Insufficient documentation

## 2012-08-08 DIAGNOSIS — M5137 Other intervertebral disc degeneration, lumbosacral region: Secondary | ICD-10-CM | POA: Insufficient documentation

## 2012-08-08 DIAGNOSIS — G8929 Other chronic pain: Secondary | ICD-10-CM | POA: Insufficient documentation

## 2012-08-08 DIAGNOSIS — M79609 Pain in unspecified limb: Secondary | ICD-10-CM | POA: Insufficient documentation

## 2012-08-08 MED ORDER — DIAZEPAM 5 MG PO TABS: 5.0000 mg | ORAL_TABLET | Freq: Four times a day (QID) | ORAL | Status: DC | PRN

## 2012-08-08 MED ORDER — MELOXICAM 15 MG PO TABS: 15.0000 mg | ORAL_TABLET | Freq: Every day | ORAL | Status: DC

## 2012-08-08 MED ORDER — OXYCODONE-ACETAMINOPHEN 10-325 MG PO TABS: 1.0000 | ORAL_TABLET | Freq: Four times a day (QID) | ORAL | Status: DC | PRN

## 2012-08-08 NOTE — Patient Instructions (Signed)
Try to stay as active as pain permits. 

## 2012-08-08 NOTE — Progress Notes (Signed)
Subjective:    Patient ID: Scott [name], male    DOB: [birth year], 52 y.o.   MRN: [id number]  HPI The patient complains about chronic neck and low back pain .  The neck pain has gotten worse. He reports that his range of motion in his neck has decreased again, because of pain mainly on the left side of his neck, radiating into his left arm, today he states, that it is radiating into his entire arm, all around.  He states, that he has quitt smoking with the help of his pulmonologist.He reports that his breathing is much better. He is doing much better today, he states, that he can walk for much longer now, because he quit smoking, which helps him with his back pain.  He reports that he had a colonoscopy on March 19th, where they found 4 polyps, which were removed and not cancerous.  He reports that the appointment he had with Dr. Lovell Sheehan, his neurosurgeon on May 30th, 2014 was postponed twice, he then had an appointment on July 11th, he went to the office, and was told that Dr. Lovell Sheehan is out of town, he was given a new appointment in August.  Pain Inventory Average Pain 10 Pain Right Now 8 My pain is sharp, burning, dull, stabbing and tingling  In the last 24 hours, has pain interfered with the following? General activity 10 Relation with others 10 Enjoyment of life 10 What TIME of day is your pain at its worst? constant Sleep (in general) Poor  Pain is worse with: walking, bending, sitting and standing Pain improves with: rest and medication Relief from Meds: 8  Mobility walk without assistance use a cane how many minutes can you walk? 5-7 ability to climb steps?  no do you drive?  no Do you have any goals in this area?  yes  Function disabled: date disabled 11/09 I need assistance with the following:  dressing, bathing, meal prep, household duties and shopping Do you have any goals in this area?  yes  Neuro/Psych weakness numbness tingling trouble walking anxiety loss  of taste or smell  Prior Studies Any changes since last visit?  no  Physicians involved in your care Any changes since last visit?  no   Family History  Problem Relation Age of Onset  . Emphysema Mother   . COPD Mother   . Asthma Brother   . Asthma Sister   . Heart disease Maternal Uncle    History   Social History  . Marital Status: Single    Spouse Name: N/A    Number of Children: N/A  . Years of Education: N/A   Occupational History  . DISABLED    Social History Main Topics  . Smoking status: Former Smoker -- 0.30 packs/day for 41 years    Types: Cigarettes    Start date: 03/17/2012    Quit date: 06/07/2012  . Smokeless tobacco: Never Used     Comment: started patch 2 wks ago//06/21/12  . Alcohol Use: No  . Drug Use: No  . Sexually Active: None   Other Topics Concern  . None   Social History Narrative  . None   Past Surgical History  Procedure Laterality Date  . Tonsillectomy    . Other surgical history      surgery on cheekbone and head for fall 2000  . Other surgical history      states when about 52yrs old he was urinating blood, told he had a tumor in his  penis and  had surgery for this  . Multiple tooth extractions    . Anterior cervical decomp/discectomy fusion  12/22/2010    Procedure: ANTERIOR CERVICAL DECOMPRESSION/DISCECTOMY FUSION 3 LEVELS;  Surgeon: Cristi Loron;  Location: [facility];  Service: Neurosurgery;  Laterality: N/A;  Anterior cervical discectomy with fusion cervical three-four, four-five, and five sixCDF with Interbody Prosthesis, plating, and Bone Graft    Past Medical History  Diagnosis Date  . COPD (chronic obstructive pulmonary disease)   . GERD (gastroesophageal reflux disease)   . Headache(784.0)   . 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 Diabetes mellitus without complication    BP 148/104  Pulse 94  Resp 16  Ht 5\' 8"  (1.727 m)  Wt 217 lb (98.431 kg)  BMI 33 kg/m2  SpO2  88%     Review of Systems  Constitutional: Positive for diaphoresis and unexpected weight change.  Respiratory: Positive for shortness of breath and wheezing.   Musculoskeletal: Positive for gait problem.  Neurological: Positive for dizziness, weakness and numbness.  Psychiatric/Behavioral: The patient is nervous/anxious.   All other systems reviewed and are negative.       Objective:   Physical Exam Constitutional: He is oriented to person, place, and time. He appears well-developed and well-nourished.  HENT:  Head: Normocephalic.  Pulmonary/Chest: He is in no distress. Musculoskeletal: He exhibits tenderness. Low back, paraspinal bilateral, worse on the right. Neurological: He is alert and oriented to person, place, and time.  Skin: Skin is warm and dry.  Psychiatric: He has a normal mood and affect.  Symmetric normal motor tone is noted throughout. Normal muscle bulk. Muscle testing reveals 5/5 muscle strength of the upper extremity except left deltoid and biceps 4/5, and 4-/5 of the right lower extremity. Full range of motion in upper and lower extremities. ROM of C-spine is severely restricted,in all directions, worse in extension.  DTR in the upper and lower extremity are present and symmetric 3+. No clonus is noted.  Patient arises from chair with some difficulty. Wide based gait with a limp, and deviation of spine to the right. Tandem walk is stable. SLR positive on the right.        Assessment & Plan:  This is a 52 year old male with  1. Cervical spondylosis with myelopathy , exacerbation radiating into his entire arm today. 2. Status post ACDF C3 to C6  3. 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 Neck pain  5. low back pain,with radiating into the LEs bilateral in L5 distribution, exacerbation s/p lifting heavy . Ordered MRI of L-spine,MRI was stable compared to his last one, except at L4-5, there is a mildly progressive annular disc bulging, resulting in  mildly progressive central and lateral recess stenosis bilaterally. Patient does not want ESI, they have not helped him in the past.  6. Emphysema, COPD  Plan  He reports that the appointment he had with Dr. Lovell Sheehan, his neurosurgeon on May 30th, 2014 was postponed twice, he had an appointment on July 11th, where Dr. Lovell Sheehan was out of town, but he was not given notice, per patient.  Continue Mobic for his exacerbated symptoms, which the patient has not done. I advised him to take the mobic for his increased pain,as prescribed. Also ordered MRI w/wo of his C-spine , if severe findings, might call office of neurosurgery to get him in earlier. Advised patient to continue with resting applying heat and /or  ice, trying to walk some if pain allows.  Refilled his Percocet, percocet was increased from 7.5 mg 5 times a day, to 10mg  qid .Discussed with patient that this is not long term, just until he sees neurosurgeon for other options. Also refilled his Valium, which gives him good relief from his muscle spasms, he can take this q6 hrs, prescribed # 120.  Patient got a card from his insurance to use the [employer] program, I recommended exercising in the pool as pain permits, if his pulmonologist approves.  Follow up with PA in 1 month.

## 2012-08-12 ENCOUNTER — Other Ambulatory Visit: Payer: Self-pay | Admitting: Emergency Medicine

## 2012-08-16 ENCOUNTER — Ambulatory Visit
Admission: RE | Admit: 2012-08-16 | Discharge: 2012-08-16 | Disposition: A | Discharge status: Home / Self Care | Payer: Medicare Other | Source: Ambulatory Visit | Attending: Physical Medicine and Rehabilitation | Admitting: Physical Medicine and Rehabilitation

## 2012-08-16 ENCOUNTER — Other Ambulatory Visit: Payer: Self-pay | Admitting: Emergency Medicine

## 2012-08-16 DIAGNOSIS — M542 Cervicalgia: Secondary | ICD-10-CM

## 2012-08-16 DIAGNOSIS — M961 Postlaminectomy syndrome, not elsewhere classified: Secondary | ICD-10-CM

## 2012-08-19 ENCOUNTER — Telehealth: Payer: Self-pay

## 2012-08-19 NOTE — Telephone Encounter (Signed)
Left message for patient regarding MRI results.  Advised him to follow up with neurosurgeon and call if needed.

## 2012-08-19 NOTE — Telephone Encounter (Signed)
Message copied by Judd Gaudier on Mon Aug 19, 2012  3:07 PM ------      Message from: Su Monks      Created: Mon Aug 19, 2012 10:26 AM       Please inform patient that MRI showed cervical Spondylosis with foraminal narrowing and spinal stenosis. He has an appointment with neurosurgery.      Will discuss MRI in more detail at his next visit. ------

## 2012-08-20 ENCOUNTER — Telehealth: Payer: Self-pay | Admitting: Physical Medicine & Rehabilitation

## 2012-08-20 NOTE — Telephone Encounter (Signed)
Message was left for pt

## 2012-08-20 NOTE — Telephone Encounter (Signed)
324 pm: MRI results ( per sl notes)

## 2012-09-04 ENCOUNTER — Encounter
Payer: Medicare Other | Attending: Physical Medicine and Rehabilitation | Admitting: Physical Medicine and Rehabilitation

## 2012-09-04 ENCOUNTER — Encounter: Payer: Self-pay | Admitting: Physical Medicine and Rehabilitation

## 2012-09-04 VITALS — BP 139/81 | HR 85 | Resp 14 | Ht 68.0 in | Wt 215.0 lb

## 2012-09-04 DIAGNOSIS — Z79899 Other long term (current) drug therapy: Secondary | ICD-10-CM | POA: Insufficient documentation

## 2012-09-04 DIAGNOSIS — M5137 Other intervertebral disc degeneration, lumbosacral region: Secondary | ICD-10-CM | POA: Insufficient documentation

## 2012-09-04 DIAGNOSIS — M4712 Other spondylosis with myelopathy, cervical region: Secondary | ICD-10-CM | POA: Insufficient documentation

## 2012-09-04 DIAGNOSIS — E119 Type 2 diabetes mellitus without complications: Secondary | ICD-10-CM | POA: Insufficient documentation

## 2012-09-04 DIAGNOSIS — Z87891 Personal history of nicotine dependence: Secondary | ICD-10-CM | POA: Insufficient documentation

## 2012-09-04 DIAGNOSIS — Z981 Arthrodesis status: Secondary | ICD-10-CM | POA: Insufficient documentation

## 2012-09-04 DIAGNOSIS — M961 Postlaminectomy syndrome, not elsewhere classified: Secondary | ICD-10-CM

## 2012-09-04 DIAGNOSIS — J438 Other emphysema: Secondary | ICD-10-CM | POA: Insufficient documentation

## 2012-09-04 DIAGNOSIS — M51379 Other intervertebral disc degeneration, lumbosacral region without mention of lumbar back pain or lower extremity pain: Secondary | ICD-10-CM | POA: Insufficient documentation

## 2012-09-04 DIAGNOSIS — M47812 Spondylosis without myelopathy or radiculopathy, cervical region: Secondary | ICD-10-CM

## 2012-09-04 MED ORDER — DIAZEPAM 5 MG PO TABS: 5.0000 mg | ORAL_TABLET | Freq: Four times a day (QID) | ORAL | Status: DC | PRN

## 2012-09-04 MED ORDER — OXYCODONE-ACETAMINOPHEN 7.5-325 MG PO TABS: 1.0000 | ORAL_TABLET | ORAL | Status: DC | PRN

## 2012-09-04 NOTE — Progress Notes (Signed)
Subjective:    Patient ID: Scott [name], male    DOB: 1960-07-04, 52 y.o.   MRN: [id number]  HPI The patient complains about chronic neck and low back pain .  The neck pain has gotten worse. He reports that his range of motion in his neck has decreased again, because of pain mainly on the left side of his neck, radiating into his left arm, today he states, that it is radiating into his entire arm, all around. He states, that he has quitt smoking with the help of his pulmonologist.He reports that his breathing is much better. He is doing much better today, he states, that he can walk for much longer now, because he quit smoking, which helps him with his back pain.  He reports that he had a colonoscopy on March 19th, where they found 4 polyps, which were removed and not cancerous.  He reports that the appointment he had with Dr. Lovell Sheehan, his neurosurgeon on May 30th, 2014 was postponed twice, he then had an appointment on July 11th, he went to the office, and was told that Dr. Lovell Sheehan is out of town, he was given a new appointment on  August 19th.  Pain Inventory Average Pain 10 Pain Right Now 8 My pain is constant, sharp, dull, stabbing and tingling  In the last 24 hours, has pain interfered with the following? General activity 10 Relation with others 10 Enjoyment of life 10 What TIME of day is your pain at its worst? all Sleep (in general) Poor  Pain is worse with: walking, bending, sitting and standing Pain improves with: rest and medication Relief from Meds: 8  Mobility walk without assistance use a cane how many minutes can you walk? 5-7 ability to climb steps?  no do you drive?  no  Function disabled: date disabled 11/2007 I need assistance with the following:  dressing, bathing, meal prep, household duties and shopping  Neuro/Psych weakness numbness tingling trouble walking dizziness depression anxiety loss of taste or smell  Prior Studies Any changes since last  visit?  yes CT/MRI  Physicians involved in your care Any changes since last visit?  no   Family History  Problem Relation Age of Onset  . Emphysema Mother   . COPD Mother   . Asthma Brother   . Asthma Sister   . Heart disease Maternal Uncle    History   Social History  . Marital Status: Married    Spouse Name: N/A    Number of Children: N/A  . Years of Education: N/A   Occupational History  . DISABLED    Social History Main Topics  . Smoking status: Former Smoker -- 0.30 packs/day for 41 years    Types: Cigarettes    Start date: 03/17/2012    Quit date: 06/07/2012  . Smokeless tobacco: Never Used     Comment: started patch 2 wks ago//06/21/12  . Alcohol Use: No  . Drug Use: No  . Sexual Activity: None   Other Topics Concern  . None   Social History Narrative  . None   Past Surgical History  Procedure Laterality Date  . Tonsillectomy    . Other surgical history      surgery on cheekbone and head for fall 2000  . 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 Multiple tooth extractions    .  Anterior cervical decomp/discectomy fusion  12/22/2010    Procedure: ANTERIOR CERVICAL DECOMPRESSION/DISCECTOMY FUSION 3 LEVELS;  Surgeon: Cristi Loron;  Location: [facility];  Service: Neurosurgery;  Laterality: N/A;  Anterior cervical discectomy with fusion cervical three-four, four-five, and five sixCDF with Interbody Prosthesis, plating, and Bone Graft    Past Medical History  Diagnosis Date  . COPD (chronic obstructive pulmonary disease)   . GERD (gastroesophageal reflux disease)   . Headache(784.0)   . 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 Diabetes mellitus without complication    BP 139/81  Pulse 85  Resp 14  Ht 5\' 8"  (1.727 m)  Wt 215 lb (97.523 kg)  BMI 32.7 kg/m2  SpO2 91%    Review of Systems  HENT: Positive for neck pain and  neck stiffness.   Musculoskeletal:       Arm pain and numbness  Neurological: Positive for dizziness and numbness.       Tingling  Psychiatric/Behavioral: Positive for dysphoric mood. The patient is nervous/anxious.   All other systems reviewed and are negative.       Objective:   Physical Exam Constitutional: He is oriented to person, place, and time. He appears well-developed and well-nourished.  HENT:  Head: Normocephalic.  Pulmonary/Chest: He is in no distress. Musculoskeletal: He exhibits tenderness. Low back, paraspinal bilateral, worse on the right. Neurological: He is alert and oriented to person, place, and time.  Skin: Skin is warm and dry.  Psychiatric: He has a normal mood and affect.  Symmetric normal motor tone is noted throughout. Normal muscle bulk. Muscle testing reveals 5/5 muscle strength of the upper extremity except left deltoid and biceps 4/5, and 4-/5 of the right lower extremity. Full range of motion in upper and lower extremities. ROM of C-spine is severely restricted,in all directions, worse in extension.  DTR in the upper and lower extremity are present and symmetric 3+. No clonus is noted.  Patient arises from chair with some difficulty. Wide based gait with a limp, and deviation of spine to the right. Tandem walk is stable.  SLR positive on the right.        Assessment & Plan:  This is a 52 year old male with  1. Cervical spondylosis with myelopathy , exacerbation radiating into his entire arm today.  2. Status post ACDF C3 to C6  3. 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 Neck pain  5. low back pain,with radiating into the LEs bilateral in L5 distribution, exacerbation s/p lifting heavy . Ordered MRI of L-spine,MRI was stable compared to his last one, except at L4-5, there is a mildly progressive annular disc bulging, resulting in mildly progressive central and lateral recess stenosis bilaterally. Patient does not want ESI, they have not helped  him in the past.  6. Emphysema, COPD  Plan  He reports that the appointment he had with Dr. Lovell Sheehan, his neurosurgeon on May 30th, 2014 was postponed twice, he had an appointment on July 11th, where Dr. Lovell Sheehan was out of town, but he was not given notice, per patient.His next appointment is scheduled for August the 19th.  Continue Mobic for his exacerbated symptoms, which the patient has not done. I advised him to take the mobic for his increased pain,as prescribed. Also ordered MRI wo of his C-spine ,which showed : Prior fusion C3-C6. Currently most notable changes at the C6-7  level. Summary of pertinent findings includes:  C2-3 tiny central  protrusion. Minimal left foraminal narrowing.  C3-4 minimal left foraminal narrowing. Just below the disc space,  central spur with mild spinal stenosis.  C4-5 minimal right foraminal narrowing. Minimal spur without  spinal stenosis.  C5-6 right uncinate hypertrophy with mild to moderate right  foraminal narrowing. Minimal spur without spinal stenosis.  C6-7 broad-based protrusion with mild spinal stenosis. Bilateral  uncinate hypertrophy. Additionally, left foraminal disc  protrusion. Marked left-sided and moderate right-sided foraminal  narrowing.  C7-T1 facet joint degenerative changes. Minimal anterior slip C7.  Posterior-superior aspect of T1 projects slightly into the neural  foramen with bilateral foraminal narrowing greater on the left.  Focal cord signal abnormality at the C3-4 level where cord is  slightly atrophic. 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 Explained his MRI in great detail, also went over the images, spent 25 min with patient face to face.  Advised patient to continue with resting applying heat and /or ice, trying to walk some if pain allows.  Refilled his Percocet, percocet was increased from 7.5 mg 5 times a day, to 10mg  qid at the last visit, but he states, that  he had better relief with taking the 7.5 five times a day, therefore I prescribed Oxy 7.5/325, 5/day # 150  .Discussed with patient that this is not long term, just until he sees neurosurgeon for other options. Also refilled his Valium, which gives him good relief from his muscle spasms, he can take this q6 hrs, prescribed # 120.  Patient got a card from his insurance to use the [employer] program, I recommended exercising in the pool as pain permits,and if his pulmonologist approves.  Follow up with PA in 1 month.

## 2012-09-10 ENCOUNTER — Telehealth: Payer: Self-pay

## 2012-09-10 NOTE — Telephone Encounter (Signed)
Patient called in and stated that he dropped his oxycodone in the toilet this morning. He wanted to know if he could get a refill? If he could not got a refill could he go to his PCP to get a RX to last until his refill is due again.

## 2012-09-10 NOTE — Telephone Encounter (Signed)
We do not replace prescriptions He is not allowed to get prescriptions from his PCP We could call in tramadol in place of oxycodone into will be due date 1 per os 4 times a day

## 2012-09-10 NOTE — Telephone Encounter (Signed)
Tried to call patient. No answer and no voice mail 

## 2012-09-11 ENCOUNTER — Telehealth: Payer: Self-pay

## 2012-09-11 NOTE — Telephone Encounter (Signed)
Tramadol 50mg  2 po TID #180 no refill

## 2012-09-11 NOTE — Telephone Encounter (Signed)
Patient decided to accept the Tramadol RX. Please clarify directions.

## 2012-09-11 NOTE — Telephone Encounter (Signed)
Called patient to let him know that we could not authorize a refill of hydrocodone, and could offer tramadol. Patient refused tramadol and stated he would just deal with the pain until his next visit.

## 2012-09-11 NOTE — Telephone Encounter (Signed)
Patient called back and said he wants the tramadol called in now. Called in RX to [place] on [address] [phone].

## 2012-09-12 MED ORDER — TRAMADOL HCL 50 MG PO TABS: 50.0000 mg | ORAL_TABLET | Freq: Three times a day (TID) | ORAL | Status: DC

## 2012-09-12 NOTE — Telephone Encounter (Signed)
Called in tramadol 50mg  sig 2 po tid #180 no refills.

## 2012-09-12 NOTE — Addendum Note (Signed)
Addended by: Sherre Poot on: 09/12/2012 11:15 AM   Modules accepted: Orders

## 2012-09-13 ENCOUNTER — Other Ambulatory Visit: Payer: Self-pay | Admitting: Emergency Medicine

## 2012-09-18 ENCOUNTER — Ambulatory Visit: Payer: Medicare Other | Admitting: Emergency Medicine

## 2012-09-20 ENCOUNTER — Encounter: Payer: Self-pay | Admitting: Emergency Medicine

## 2012-09-20 ENCOUNTER — Ambulatory Visit (INDEPENDENT_AMBULATORY_CARE_PROVIDER_SITE_OTHER): Payer: PRIVATE HEALTH INSURANCE | Admitting: Emergency Medicine

## 2012-09-20 VITALS — BP 122/90 | HR 85 | Ht 68.0 in | Wt 214.6 lb

## 2012-09-20 DIAGNOSIS — J449 Chronic obstructive pulmonary disease, unspecified: Secondary | ICD-10-CM

## 2012-09-20 MED ORDER — ALBUTEROL SULFATE HFA 108 (90 BASE) MCG/ACT IN AERS: 2.0000 | INHALATION_SPRAY | RESPIRATORY_TRACT | Status: DC | PRN

## 2012-09-20 NOTE — Patient Instructions (Addendum)
Please continue Advair twice a day  Start using your combivent 1 puff four times a day ON A SCHEDULE We will change Proventil to Ventolin. Use this 2 puffs up to every 4 hours if needed for shortness of breath You are high risk for surgery, but this does not mean you can't have the surgery if the benefits outweigh the risks. We will send this info to Dr Lovell Sheehan.  Follow with Dr Delton Coombes in 3 months or sooner if you have any problems.

## 2012-09-20 NOTE — Assessment & Plan Note (Signed)
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 He is at high risk for any procedure requiring general anesthesia, including the risk for prolonged ventilation or hospitalization, or even risk for death. Explained to pt that this does not preclude surgery if the benefits outweigh these risks. Will forward this note to Dr Lovell Sheehan.  - continue Advair bid - combivent > change to qid on a schedule.  - he wants to change proventil to ventolin.  - rov 3 months

## 2012-09-20 NOTE — Progress Notes (Signed)
Subjective:    Patient ID: Scott [name], male    DOB: 02/04/1960, 52 y.o.   MRN: [id number]  HPI 52 yo man, tobacco user (smoking 1/2 pk/day), hx of COPD in a study at [facility]. Has had PFT at [facility] and ? At [facility] as well. Now following with Dr Scott [name] at [facility]. He has been having increased combivent use for SOB. Coughs every day, has been worse since anterior cervical fusion in November. Sputum is clear to yellow.  He is also on Advair.  He has bad GERD, better w omeprazole. Has allergy sx, on no meds.   ROV 07/11/11 -- COPD, still smoking 5 cig a day. Tells me that his breathing is the same to worse. Taking Advair bid, combivent ordered qid but he uses prn. Has nicotine patches but not using. Discussed cigarette cessation in detail today. Talked about setting a quit date Tells me that he benefited from the loratadine and fluticasone but stopped taking.   ROV 09/12/11 -- COPD, still smoking 2-3 cig a day. Returns for f/u. Has been on combivent prn + Advair bid. Using proventil, not as beneficial as the ventolin. Remains on loratadine + fluticasone. Also remains on omeprazole. No AE since last visit, although he did take some spare prednisone that he had last month. No abx. He is interested in entering a clinical trial at [facility].   ROV 12/11/11 -- COPD. Returns for f/u. Has been on combivent prn + Advair bid. He had stopped smoking x 2 weeks but then restarted a few days ago, currently on 2-3 a day. He had spirometry as part of clinical trial at [facility] 12/05/11, FEV1 stable at 1.61L (43% pred), ratio 52%. Uses SABA 4-6 x a day. Planning to quit tobacco at [facility]. No AE since last visit.   ROV 03/19/12 -- COPD. Returns for f/u. Has been on combivent prn + Advair bid. He was able to quit tobacco x 10 weeks! He had a slip-up over the last week, but is trying to quit again. He has a quit-coach through the [facility] quit line. He is having some drainage and cough for the last 2 days. No known URI  exposure. Uses combivent more freq than prescribed.  >>no changes   Acute OV 04/08/12 --  Complains of prod cough with green mucus, wheezing, increased SOB, cold sweats x10days, will finish abx and pred taper today Called in abx via telephone message on 3/10 CXR today shows no acute changes  Ran out of atrovent nebs. Only has albuterol nebs.  No smoking since last ov.  NO hemoptysis , chest pain , or edema.   ROV 06/21/12 -- COPD, allergies, GERD, cough. Was seen 3/17 for bronchitis, was treated with pred + abx. He improved. For the last 4-6 weeks has been dealing with more cough and mucous, has been out of loratadine and fluticasone for months. He is on combivent + Advair. He didn't have any albuterol so he started using combivent prn.   ROV 09/20/12 -- COPD, allergies, GERD, cough. Quit smoking 2 months. FEV1 1.61 (43%) in 2013. He presents today for pre-operative risk eval before a planned repeat cervical fusion by Dr Scott [name].  He tells me that he has been breathing fairly well, although he developed some URI sx last 3-4 days. Has a cough with clear mucous. No wheezing. He is on Advair + combivent or ventolin prn. He uses ~3x a day.     Objective:   Physical Exam  Filed Vitals:  09/20/12 0912  BP: 122/90  Pulse: 85  Height: 5\' 8"  (1.727 m)  Weight: 214 lb 9.6 oz (97.342 kg)  SpO2: 95%   Gen: Pleasant, well-nourished, in no distress  ENT: No lesions,  mouth clear,  oropharynx clear, no postnasal drip,   Neck: No JVD, no TMG, no carotid bruits  Lungs: scattered rhonchi with no wheezes, loud wheeze on a forced exp  Cardiovascular: RRR, heart sounds normal, no murmur or gallops, no peripheral edema  Musculoskeletal: No deformities, no cyanosis or clubbing  Neuro: alert, non focal  Skin: Warm, no lesions or rashes     Assessment & Plan:  COPD (chronic obstructive pulmonary disease) 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 He is at high risk for any procedure  requiring general anesthesia, including the risk for prolonged ventilation or hospitalization, or even risk for death. Explained to pt that this does not preclude surgery if the benefits outweigh these risks. Will forward this note to Dr Scott [name].  - continue Advair bid - combivent > change to qid on a schedule.  - he wants to change proventil to ventolin.  - rov 3 months

## 2012-09-20 NOTE — Addendum Note (Signed)
Addended by: Orma Flaming D on: 09/20/2012 09:46 AM   Modules accepted: Orders

## 2012-09-25 ENCOUNTER — Telehealth: Payer: Self-pay | Admitting: Emergency Medicine

## 2012-09-25 NOTE — Telephone Encounter (Signed)
I spoke with Darl Pikes and advised her will send over office note regarding surgery. She asked for this to be sent [phone]. This has been sent and nothing further needed

## 2012-10-02 ENCOUNTER — Encounter
Payer: PRIVATE HEALTH INSURANCE | Attending: Physical Medicine and Rehabilitation | Admitting: Physical Medicine and Rehabilitation

## 2012-10-02 ENCOUNTER — Encounter: Payer: Self-pay | Admitting: Physical Medicine and Rehabilitation

## 2012-10-02 VITALS — BP 147/91 | HR 102 | Resp 14 | Ht 68.0 in | Wt 214.6 lb

## 2012-10-02 DIAGNOSIS — M545 Low back pain, unspecified: Secondary | ICD-10-CM | POA: Insufficient documentation

## 2012-10-02 DIAGNOSIS — Z981 Arthrodesis status: Secondary | ICD-10-CM | POA: Insufficient documentation

## 2012-10-02 DIAGNOSIS — M961 Postlaminectomy syndrome, not elsewhere classified: Secondary | ICD-10-CM | POA: Insufficient documentation

## 2012-10-02 DIAGNOSIS — G8929 Other chronic pain: Secondary | ICD-10-CM | POA: Insufficient documentation

## 2012-10-02 DIAGNOSIS — M4712 Other spondylosis with myelopathy, cervical region: Secondary | ICD-10-CM | POA: Insufficient documentation

## 2012-10-02 DIAGNOSIS — M542 Cervicalgia: Secondary | ICD-10-CM | POA: Insufficient documentation

## 2012-10-02 DIAGNOSIS — J438 Other emphysema: Secondary | ICD-10-CM | POA: Insufficient documentation

## 2012-10-02 MED ORDER — DIAZEPAM 5 MG PO TABS: 5.0000 mg | ORAL_TABLET | Freq: Four times a day (QID) | ORAL | Status: DC | PRN

## 2012-10-02 MED ORDER — OXYCODONE-ACETAMINOPHEN 7.5-325 MG PO TABS: 1.0000 | ORAL_TABLET | ORAL | Status: DC | PRN

## 2012-10-02 NOTE — Progress Notes (Signed)
Subjective:    Patient ID: Scott [name], male    DOB: 07/05/1960, 52 y.o.   MRN: [id number]  HPI The patient complains about chronic neck and low back pain .  The neck pain has gotten worse. He reports that his range of motion in his neck has decreased again, because of pain mainly on the left side of his neck, radiating into his left arm, today he states, that it is radiating into his entire arm, all around. He states, that he has quitt smoking with the help of his pulmonologist.He reports that his breathing is much better. He is doing much better today, he states, that he can walk for much longer now, because he quit smoking, which helps him with his back pain.  He reports that he had a colonoscopy on March 19th, where they found 4 polyps, which were removed and not cancerous.  He reports that the appointment he had with Dr. Lovell Sheehan, his neurosurgeon on May 30th, 2014 was postponed twice, he then had an appointment on July 11th, he went to the office, and was told that Dr. Lovell Sheehan is out of town, he was given a new appointment on August 19th. Dr. Lovell Sheehan told him that he needs surgery he is planing an ACDF at C6-7. The patient states that he will follow up with Dr. Lovell Sheehan again tomorrow. He also reports that he spilled his Oxycodone into the toilet, at that time he called our office, I was at a conference. Dr. Doroteo Bradford prescribed Tramadol 100mg  tid, the patient admits that he sometimes took 1-2 tablets more of the Tramadol, because he was in so severe pain.  Pain Inventory Average Pain 10 Pain Right Now 10 My pain is sharp, burning, stabbing and tingling  In the last 24 hours, has pain interfered with the following? General activity 10 Relation with others 10 Enjoyment of life 10 What TIME of day is your pain at its worst? all day Sleep (in general) Poor  Pain is worse with: walking, bending, sitting and standing Pain improves with: rest and medication Relief from Meds:  8  Mobility walk without assistance use a cane how many minutes can you walk? 5-7 ability to climb steps?  no do you drive?  no Do you have any goals in this area?  yes  Function disabled: date disabled 11-2007 I need assistance with the following:  dressing, bathing, meal prep, household duties and shopping Do you have any goals in this area?  yes  Neuro/Psych weakness numbness tingling trouble walking dizziness depression anxiety loss of taste or smell  Prior Studies Any changes since last visit?  no  Physicians involved in your care Any changes since last visit?  no   Family History  Problem Relation Age of Onset  . Emphysema Mother   . COPD Mother   . Asthma Brother   . Asthma Sister   . Heart disease Maternal Uncle    History   Social History  . Marital Status: Married    Spouse Name: N/A    Number of Children: N/A  . Years of Education: N/A   Occupational History  . DISABLED    Social History Main Topics  . Smoking status: Former Smoker -- 0.30 packs/day for 41 years    Types: Cigarettes    Start date: 03/17/2012    Quit date: 06/07/2012  . Smokeless tobacco: Never Used  . Alcohol Use: No  . Drug Use: No  . Sexual Activity: None   Other Topics Concern  .  None   Social History Narrative  . None   Past Surgical History  Procedure Laterality Date  . Tonsillectomy    . Other surgical history      surgery on cheekbone and head for fall 2000  . 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 Multiple tooth extractions    . Anterior cervical decomp/discectomy fusion  12/22/2010    Procedure: ANTERIOR CERVICAL DECOMPRESSION/DISCECTOMY FUSION 3 LEVELS;  Surgeon: Cristi Loron;  Location: [facility];  Service: Neurosurgery;  Laterality: N/A;  Anterior cervical discectomy with fusion cervical three-four, four-five, and five sixCDF with Interbody  Prosthesis, plating, and Bone Graft    Past Medical History  Diagnosis Date  . COPD (chronic obstructive pulmonary disease)   . GERD (gastroesophageal reflux disease)   . Headache(784.0)   . 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 Diabetes mellitus without complication    BP 147/91  Pulse 102  Resp 14  Ht 5\' 8"  (1.727 m)  Wt 214 lb 9.6 oz (97.342 kg)  BMI 32.64 kg/m2  SpO2 92%     Review of Systems  Constitutional: Positive for diaphoresis, appetite change and unexpected weight change.  HENT:       Loss of taste or smell  Respiratory: Positive for apnea, cough, shortness of breath and wheezing.   Endocrine:       High blood sugar  Musculoskeletal: Positive for gait problem.  Neurological: Positive for dizziness, weakness and numbness.       Tingling   Psychiatric/Behavioral: Positive for dysphoric mood. The patient is nervous/anxious.   All other systems reviewed and are negative.       Objective:   Physical Exam Constitutional: He is oriented to person, place, and time. He appears well-developed and well-nourished.  HENT:  Head: Normocephalic.  Pulmonary/Chest: He is in no distress. Musculoskeletal: He exhibits tenderness. Low back, paraspinal bilateral, worse on the right. Neurological: He is alert and oriented to person, place, and time.  Skin: Skin is warm and dry.  Psychiatric: He has a normal mood and affect.  Symmetric normal motor tone is noted throughout. Normal muscle bulk. Muscle testing reveals 5/5 muscle strength of the upper extremity except left deltoid and biceps 4/5, and 4-/5 of the right lower extremity. Full range of motion in upper and lower extremities. ROM of C-spine is severely restricted,in all directions, worse in extension.  DTR in the upper and lower extremity are present and symmetric 3+. No clonus is noted.  Patient arises from chair with some difficulty. Wide based gait with a limp, and deviation of spine to the  right. Tandem walk is stable.  SLR positive on the right.        Assessment & Plan:  This is a 52 year old male with  1. Cervical spondylosis with myelopathy , exacerbation radiating into his entire arm today.  2. Status post ACDF C3 to C6  3. 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 Neck pain  5. low back pain,with radiating into the LEs bilateral in L5 distribution, exacerbation s/p lifting heavy . Ordered MRI of L-spine,MRI was stable compared to his last one, except at L4-5, there is a mildly progressive annular disc bulging, resulting in mildly progressive central and lateral recess stenosis bilaterally. Patient does not want ESI, they  have not helped him in the past.  6. Emphysema, COPD  Plan  He reports that the appointment he had with Dr. Lovell Sheehan, his neurosurgeon on May 30th, 2014 was postponed twice, he had an appointment on July 11th, where Dr. Lovell Sheehan was out of town, but he was not given notice, per patient.His next appointment was scheduled for August the 19th.He saw Dr. Lovell Sheehan . Dr. Lovell Sheehan told him that he needs surgery he is planing an ACDF at C6-7. The patient states that he will follow up with Dr. Lovell Sheehan again tomorrow. He also reports that he spilled his Oxycodone into the toilet, at that time he called our office, I was at a conference. Dr. Doroteo Bradford prescribed Tramadol 100mg  tid, the patient admits that he sometimes took 1-2 tablets more of the Tramadol, because he was in so severe pain. I educated him about the risks of taking more medication than prescribed, especially with also having COPD. I told the patient that if he has any inconsistency again, we will not prescribe narcotics anymore. Continue Mobic for his exacerbated symptoms, which the patient has not done. I advised him to take the mobic for his increased pain,as prescribed. Also ordered MRI wo of his C-spine ,which showed :  Prior fusion C3-C6. Currently most notable changes at the C6-7  level. Summary of  pertinent findings includes:  C2-3 tiny central protrusion. Minimal left foraminal narrowing.  C3-4 minimal left foraminal narrowing. Just below the disc space,  central spur with mild spinal stenosis.  C4-5 minimal right foraminal narrowing. Minimal spur without  spinal stenosis.  C5-6 right uncinate hypertrophy with mild to moderate right  foraminal narrowing. Minimal spur without spinal stenosis.  C6-7 broad-based protrusion with mild spinal stenosis. Bilateral  uncinate hypertrophy. Additionally, left foraminal disc  protrusion. Marked left-sided and moderate right-sided foraminal  narrowing.  C7-T1 facet joint degenerative changes. Minimal anterior slip C7.  Posterior-superior aspect of T1 projects slightly into the neural  foramen with bilateral foraminal narrowing greater on the left.  Focal cord signal abnormality at the C3-4 level where cord is  slightly atrophic. 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  Explained his MRI in great detail, also went over the images.  He reports that the appointment he had with Dr. Lovell Sheehan, his neurosurgeon on May 30th, 2014 was postponed twice, he had an appointment on July 11th, where Dr. Lovell Sheehan was out of town, but he was not given notice, per patient.His next appointment was scheduled for August the 19th.He saw Dr. Lovell Sheehan . Dr. Lovell Sheehan told him that he needs surgery he is planing an ACDF at C6-7. The patient states that he will follow up with Dr. Lovell Sheehan again tomorrow.He will let us know, when he will have his surgery, and whether Dr. Lovell Sheehan will prescribe his pain medication at that time. He also reports that he spilled his Oxycodone into the toilet, at that time he called our office, I was at a conference. Dr. Doroteo Bradford prescribed Tramadol 100mg  tid, the patient admits that he sometimes took 1-2 tablets more of the Tramadol, because he was in so severe pain. I educated him about the risks  of taking more medication than prescribed, especially with also having COPD. I told the patient that if he has any inconsistency again, we will not prescribe narcotics anymore. Advised patient to continue with resting applying heat and /or ice, trying to walk some if pain allows.  Refilled his Percocet, percocet was increased  from 7.5 mg 5 times a day, to 10mg  qid at the last visit, but he states, that he had better relief with taking the 7.5 five times a day, therefore I prescribed Oxy 7.5/325, 5/day # 150 .Discussed with patient that this is not long term, just until he sees neurosurgeon for other options. Also refilled his Valium, which gives him good relief from his muscle spasms, he can take this q6 hrs, prescribed # 120.   Follow up with PA in 1 month.  Patient got a card from his insurance to use the [employer] program, I recommended exercising in the pool as pain permits,and if his pulmonologist approves, in the future.

## 2012-10-02 NOTE — Patient Instructions (Signed)
Follow up with Dr. Jenkins 

## 2012-10-03 ENCOUNTER — Other Ambulatory Visit: Payer: Self-pay | Admitting: Neurosurgery

## 2012-10-04 ENCOUNTER — Telehealth: Payer: Self-pay

## 2012-10-04 NOTE — Telephone Encounter (Signed)
Patient called to let us know that he will be having neck surgery 10/16/2012.  Dr. Peggye Ley will take care of him for the next few months.  He will get his medication from him until he is cleared from surgery.

## 2012-10-09 ENCOUNTER — Encounter (HOSPITAL_COMMUNITY): Payer: Self-pay | Admitting: Pharmacy Technician

## 2012-10-10 ENCOUNTER — Other Ambulatory Visit (HOSPITAL_COMMUNITY): Payer: Self-pay | Admitting: *Deleted

## 2012-10-10 NOTE — Pre-Procedure Instructions (Addendum)
Ananias Kolander  10/10/2012   Your procedure is scheduled on:  October 16, 2012 at 11:45 AM  Report to [facility] at 8:45 AM.  Call this number if you have problems the morning of surgery: [phone]   Remember:   Do not eat food or drink liquids after midnight.   Take these medicines the morning of surgery with A SIP OF WATER: albuterol (VENTOLIN HFA) inhaler, diazepam (VALIUM) - if needed, Fluticasone-Salmeterol (ADVAIR), omeprazole (PRILOSEC), oxyCODONE-acetaminophen (PERCOCET), fluticasone (FLONASE)   Discontinue Aspirin, Coumadin, Plavix, Effient and herbal medication as of today, 10/11/12.        Do not wear jewelry, make-up or nail polish.  Do not wear lotions, powders, or perfumes. You may wear deodorant.  Do not shave 48 hours prior to surgery. Men may shave face and neck.  Do not bring valuables to the hospital.  [facility] is not responsible                   for any belongings or valuables.  Contacts, dentures or bridgework may not be worn into surgery.  Leave suitcase in the car. After surgery it may be brought to your room.  For patients admitted to the hospital, checkout time is 11:00 AM the day of  discharge.    Special Instructions: Shower using CHG 2 nights before surgery and the night before surgery.  If you shower the day of surgery use CHG.  Use special wash - you have one bottle of CHG for all showers.  You should use approximately 1/3 of the bottle for each shower.   Please read over the following fact sheets that you were given: Pain Booklet, Coughing and Deep Breathing, MRSA Information and Surgical Site Infection Prevention

## 2012-10-11 ENCOUNTER — Encounter (HOSPITAL_COMMUNITY): Payer: Self-pay

## 2012-10-11 ENCOUNTER — Encounter (HOSPITAL_COMMUNITY)
Admission: RE | Admit: 2012-10-11 | Discharge: 2012-10-11 | Disposition: A | Discharge status: Home / Self Care | Payer: PRIVATE HEALTH INSURANCE | Source: Ambulatory Visit | Attending: Neurosurgery | Admitting: Neurosurgery

## 2012-10-11 DIAGNOSIS — Z01812 Encounter for preprocedural laboratory examination: Secondary | ICD-10-CM | POA: Insufficient documentation

## 2012-10-11 HISTORY — DX: Pneumonia, unspecified organism: J18.9

## 2012-10-11 LAB — SURGICAL PCR SCREEN
MRSA, PCR: NEGATIVE
Staphylococcus aureus: NEGATIVE

## 2012-10-11 LAB — BASIC METABOLIC PANEL
BUN: 10 mg/dL (ref 6–23)
Calcium: 9.4 mg/dL (ref 8.4–10.5)
GFR calc Af Amer: 88 mL/min — ABNORMAL LOW (ref >90)
GFR calc non Af Amer: 76 mL/min — ABNORMAL LOW (ref >90)
Glucose, Bld: 100 mg/dL — ABNORMAL HIGH (ref 70–99)
Potassium: 4.1 mEq/L (ref 3.5–5.1)
Sodium: 139 mEq/L (ref 135–145)

## 2012-10-11 LAB — CBC
MCH: 31 pg (ref 26.0–34.0)
MCHC: 35.4 g/dL (ref 30.0–36.0)
Platelets: 211 10*3/uL (ref 150–400)
RBC: 5.45 MIL/uL (ref 4.22–5.81)

## 2012-10-14 NOTE — Progress Notes (Addendum)
Anesthesia Chart Review:  Patient is a 52 year old male scheduled for C6-7 ACDF with possible removal of old plate on 7/84/69 by Dr. Lovell Sheehan.  History includes recent former smoker, obesity, COPD,GERD, DM2, arthritis, headaches, PNA, tonsillectomy, C3-6 ACDF 11/2010.   EKG on 10/11/12 showed SB with sinus arrhythmia, first degree AVB, non-specific intraventricular conduction delay.  CXR on 04/08/12 showed: Hyperinflation compatible with COPD/emphysema. No superimposed acute process.   PFTs on 11/25/10 [facility]) showed: FEV1 stable at 1.61L (43% pred), ratio 52%. FEV1 1.61 (43.2%).  Pulmonologist is Dr. Delton Coombes.  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 He is at high risk for any procedure requiring general anesthesia, including the risk for prolonged ventilation or hospitalization, or even risk for death. Explained to pt that this does not preclude surgery if the benefits outweigh these risks. Will forward this note to Dr Lovell Sheehan."   Patient at increased risk for pulmonary complications which has been discussed with patient according to pulmonology notes.  Patient and Dr. York Ram wish to proceed.  If no acute cardiopulmonary symptoms then I would anticipate that he could proceed as planned.  Dr. York Ram office reported that patient to go to ICU post-operatively.  I would anticipate Pulmonology/Critical Care consult at that time.  Velna Ochs [facility]/Anesthesiology Phone [phone] 10/14/2012 1:43 PM

## 2012-10-15 MED ORDER — VANCOMYCIN HCL IN DEXTROSE 1-5 GM/200ML-% IV SOLN
1000.0000 mg | INTRAVENOUS | Status: AC
  Administered 2012-10-16: 1000 mg via INTRAVENOUS
  Filled 2012-10-15: qty 200

## 2012-10-16 ENCOUNTER — Encounter (HOSPITAL_COMMUNITY): Payer: Self-pay | Admitting: Vascular Surgery

## 2012-10-16 ENCOUNTER — Encounter (HOSPITAL_COMMUNITY): Payer: Self-pay | Admitting: Anesthesiology

## 2012-10-16 ENCOUNTER — Encounter (HOSPITAL_COMMUNITY)
Admission: RE | Disposition: A | Discharge status: Home / Self Care | Payer: Self-pay | Source: Ambulatory Visit | Attending: Neurosurgery

## 2012-10-16 ENCOUNTER — Inpatient Hospital Stay (HOSPITAL_COMMUNITY): Payer: PRIVATE HEALTH INSURANCE | Admitting: Anesthesiology

## 2012-10-16 ENCOUNTER — Inpatient Hospital Stay (HOSPITAL_COMMUNITY): Payer: PRIVATE HEALTH INSURANCE

## 2012-10-16 ENCOUNTER — Inpatient Hospital Stay (HOSPITAL_COMMUNITY)
Admission: RE | Admit: 2012-10-16 | Discharge: 2012-10-17 | DRG: 473 | Disposition: A | Discharge status: Home / Self Care | Payer: PRIVATE HEALTH INSURANCE | Source: Ambulatory Visit | Attending: Neurosurgery | Admitting: Neurosurgery

## 2012-10-16 DIAGNOSIS — Z87891 Personal history of nicotine dependence: Secondary | ICD-10-CM

## 2012-10-16 DIAGNOSIS — Z880 Allergy status to penicillin: Secondary | ICD-10-CM

## 2012-10-16 DIAGNOSIS — J4489 Other specified chronic obstructive pulmonary disease: Secondary | ICD-10-CM | POA: Diagnosis present

## 2012-10-16 DIAGNOSIS — Z79899 Other long term (current) drug therapy: Secondary | ICD-10-CM

## 2012-10-16 DIAGNOSIS — Z981 Arthrodesis status: Secondary | ICD-10-CM

## 2012-10-16 DIAGNOSIS — K219 Gastro-esophageal reflux disease without esophagitis: Secondary | ICD-10-CM | POA: Diagnosis present

## 2012-10-16 DIAGNOSIS — M5020 Other cervical disc displacement, unspecified cervical region: Principal | ICD-10-CM | POA: Diagnosis present

## 2012-10-16 DIAGNOSIS — Z472 Encounter for removal of internal fixation device: Secondary | ICD-10-CM

## 2012-10-16 DIAGNOSIS — J449 Chronic obstructive pulmonary disease, unspecified: Secondary | ICD-10-CM | POA: Diagnosis present

## 2012-10-16 DIAGNOSIS — E119 Type 2 diabetes mellitus without complications: Secondary | ICD-10-CM | POA: Diagnosis present

## 2012-10-16 DIAGNOSIS — M47812 Spondylosis without myelopathy or radiculopathy, cervical region: Secondary | ICD-10-CM | POA: Diagnosis present

## 2012-10-16 HISTORY — PX: ANTERIOR CERVICAL DECOMP/DISCECTOMY FUSION: SHX1161

## 2012-10-16 LAB — GLUCOSE, CAPILLARY
Glucose-Capillary: 103 mg/dL — ABNORMAL HIGH (ref 70–99)
Glucose-Capillary: 90 mg/dL (ref 70–99)
Glucose-Capillary: 108 mg/dL — ABNORMAL HIGH (ref 70–99)

## 2012-10-16 SURGERY — ANTERIOR CERVICAL DECOMPRESSION/DISCECTOMY FUSION 1 LEVEL/HARDWARE REMOVAL
Anesthesia: General | Wound class: Clean

## 2012-10-16 MED ORDER — INSULIN ASPART 100 UNIT/ML ~~LOC~~ SOLN
0.0000 [IU] | SUBCUTANEOUS | Status: DC
  Administered 2012-10-16: 3 [IU] via SUBCUTANEOUS
  Administered 2012-10-17: 04:00:00 via SUBCUTANEOUS

## 2012-10-16 MED ORDER — IPRATROPIUM BROMIDE 0.02 % IN SOLN: 0.5000 mg | RESPIRATORY_TRACT | Status: DC | PRN

## 2012-10-16 MED ORDER — BACITRACIN 50000 UNITS IM SOLR
INTRAMUSCULAR | Status: DC | PRN
  Administered 2012-10-16: 12:00:00

## 2012-10-16 MED ORDER — FENTANYL CITRATE 0.05 MG/ML IJ SOLN
INTRAMUSCULAR | Status: DC | PRN
  Administered 2012-10-16: 50 ug via INTRAVENOUS
  Administered 2012-10-16: 150 ug via INTRAVENOUS
  Administered 2012-10-16 (×4): 50 ug via INTRAVENOUS

## 2012-10-16 MED ORDER — SITAGLIPTIN PHOS-METFORMIN HCL 50-500 MG PO TABS: 1.0000 | ORAL_TABLET | ORAL | Status: DC | PRN

## 2012-10-16 MED ORDER — HYDROMORPHONE HCL PF 1 MG/ML IJ SOLN
INTRAMUSCULAR | Status: AC
  Filled 2012-10-16: qty 1

## 2012-10-16 MED ORDER — PHENYLEPHRINE HCL 10 MG/ML IJ SOLN
10.0000 mg | INTRAVENOUS | Status: DC | PRN
  Administered 2012-10-16: 30 ug/min via INTRAVENOUS

## 2012-10-16 MED ORDER — PROPOFOL 10 MG/ML IV BOLUS
INTRAVENOUS | Status: DC | PRN
  Administered 2012-10-16: 200 mg via INTRAVENOUS

## 2012-10-16 MED ORDER — MORPHINE SULFATE 2 MG/ML IJ SOLN
1.0000 mg | INTRAMUSCULAR | Status: DC | PRN
  Administered 2012-10-16 – 2012-10-17 (×4): 4 mg via INTRAVENOUS
  Filled 2012-10-16 (×4): qty 2

## 2012-10-16 MED ORDER — MOMETASONE FURO-FORMOTEROL FUM 100-5 MCG/ACT IN AERO: 2.0000 | INHALATION_SPRAY | Freq: Two times a day (BID) | RESPIRATORY_TRACT | Status: DC

## 2012-10-16 MED ORDER — ACETAMINOPHEN 325 MG PO TABS: 650.0000 mg | ORAL_TABLET | ORAL | Status: DC | PRN

## 2012-10-16 MED ORDER — HYDROCODONE-ACETAMINOPHEN 5-325 MG PO TABS: 1.0000 | ORAL_TABLET | ORAL | Status: DC | PRN

## 2012-10-16 MED ORDER — MOMETASONE FURO-FORMOTEROL FUM 100-5 MCG/ACT IN AERO
2.0000 | INHALATION_SPRAY | Freq: Two times a day (BID) | RESPIRATORY_TRACT | Status: DC
  Administered 2012-10-16 – 2012-10-17 (×2): 2 via RESPIRATORY_TRACT
  Filled 2012-10-16: qty 8.8

## 2012-10-16 MED ORDER — IPRATROPIUM-ALBUTEROL 0.5-2.5 (3) MG/3ML IN SOLN: 3.0000 mL | Freq: Four times a day (QID) | RESPIRATORY_TRACT | Status: DC | PRN

## 2012-10-16 MED ORDER — ARTIFICIAL TEARS OP OINT
TOPICAL_OINTMENT | OPHTHALMIC | Status: DC | PRN
  Administered 2012-10-16: 1 via OPHTHALMIC

## 2012-10-16 MED ORDER — ALBUTEROL SULFATE HFA 108 (90 BASE) MCG/ACT IN AERS
INHALATION_SPRAY | RESPIRATORY_TRACT | Status: DC | PRN
  Administered 2012-10-16 (×2): 2 via RESPIRATORY_TRACT

## 2012-10-16 MED ORDER — DOCUSATE SODIUM 100 MG PO CAPS
100.0000 mg | ORAL_CAPSULE | Freq: Two times a day (BID) | ORAL | Status: DC
  Administered 2012-10-16: 100 mg via ORAL
  Filled 2012-10-16: qty 1

## 2012-10-16 MED ORDER — OXYCODONE HCL 5 MG/5ML PO SOLN: 5.0000 mg | Freq: Once | ORAL | Status: AC | PRN

## 2012-10-16 MED ORDER — DEXAMETHASONE SODIUM PHOSPHATE 4 MG/ML IJ SOLN
4.0000 mg | Freq: Four times a day (QID) | INTRAMUSCULAR | Status: AC
  Administered 2012-10-16 – 2012-10-17 (×2): 4 mg via INTRAVENOUS
  Filled 2012-10-16 (×2): qty 1

## 2012-10-16 MED ORDER — ONDANSETRON HCL 4 MG/2ML IJ SOLN: 4.0000 mg | INTRAMUSCULAR | Status: DC | PRN

## 2012-10-16 MED ORDER — VANCOMYCIN HCL 10 G IV SOLR
1250.0000 mg | Freq: Two times a day (BID) | INTRAVENOUS | Status: DC
  Administered 2012-10-16: 1250 mg via INTRAVENOUS
  Filled 2012-10-16 (×3): qty 1250

## 2012-10-16 MED ORDER — DIAZEPAM 5 MG PO TABS
ORAL_TABLET | ORAL | Status: AC
  Filled 2012-10-16: qty 1

## 2012-10-16 MED ORDER — INFLUENZA VAC SPLIT QUAD 0.5 ML IM SUSP
0.5000 mL | INTRAMUSCULAR | Status: DC
  Filled 2012-10-16: qty 0.5

## 2012-10-16 MED ORDER — PANTOPRAZOLE SODIUM 40 MG PO TBEC
80.0000 mg | DELAYED_RELEASE_TABLET | Freq: Every day | ORAL | Status: DC
  Administered 2012-10-17: 80 mg via ORAL
  Filled 2012-10-16: qty 2

## 2012-10-16 MED ORDER — ATORVASTATIN CALCIUM 20 MG PO TABS
20.0000 mg | ORAL_TABLET | Freq: Every day | ORAL | Status: DC
  Filled 2012-10-16 (×2): qty 1

## 2012-10-16 MED ORDER — OXYCODONE HCL 5 MG PO TABS
5.0000 mg | ORAL_TABLET | Freq: Once | ORAL | Status: AC | PRN
  Administered 2012-10-16: 5 mg via ORAL

## 2012-10-16 MED ORDER — LACTATED RINGERS IV SOLN
INTRAVENOUS | Status: DC
  Administered 2012-10-16: 18:00:00 via INTRAVENOUS

## 2012-10-16 MED ORDER — LACTATED RINGERS IV SOLN
Freq: Once | INTRAVENOUS | Status: AC
  Administered 2012-10-16: 09:00:00 via INTRAVENOUS

## 2012-10-16 MED ORDER — FLUTICASONE PROPIONATE 50 MCG/ACT NA SUSP
2.0000 | Freq: Two times a day (BID) | NASAL | Status: DC
  Administered 2012-10-16: 2 via NASAL
  Filled 2012-10-16: qty 16

## 2012-10-16 MED ORDER — NEOSTIGMINE METHYLSULFATE 1 MG/ML IJ SOLN
INTRAMUSCULAR | Status: DC | PRN
  Administered 2012-10-16: 4 mg via INTRAVENOUS

## 2012-10-16 MED ORDER — LIDOCAINE HCL (CARDIAC) 20 MG/ML IV SOLN
INTRAVENOUS | Status: DC | PRN
  Administered 2012-10-16: 40 mg via INTRAVENOUS

## 2012-10-16 MED ORDER — OXYCODONE-ACETAMINOPHEN 5-325 MG PO TABS
1.0000 | ORAL_TABLET | ORAL | Status: DC | PRN
  Administered 2012-10-16: 2 via ORAL
  Filled 2012-10-16: qty 2

## 2012-10-16 MED ORDER — VECURONIUM BROMIDE 10 MG IV SOLR
INTRAVENOUS | Status: DC | PRN
  Administered 2012-10-16: 2 mg via INTRAVENOUS
  Administered 2012-10-16: 1 mg via INTRAVENOUS

## 2012-10-16 MED ORDER — DIAZEPAM 5 MG PO TABS
5.0000 mg | ORAL_TABLET | Freq: Four times a day (QID) | ORAL | Status: DC | PRN
  Administered 2012-10-16 – 2012-10-17 (×2): 5 mg via ORAL
  Filled 2012-10-16 (×2): qty 1

## 2012-10-16 MED ORDER — ALBUMIN HUMAN 5 % IV SOLN
INTRAVENOUS | Status: DC | PRN
  Administered 2012-10-16: 14:00:00 via INTRAVENOUS

## 2012-10-16 MED ORDER — OXYCODONE HCL 5 MG PO TABS: 10.0000 mg | ORAL_TABLET | ORAL | Status: DC | PRN

## 2012-10-16 MED ORDER — MIDAZOLAM HCL 5 MG/5ML IJ SOLN
INTRAMUSCULAR | Status: DC | PRN
  Administered 2012-10-16 (×2): 1 mg via INTRAVENOUS

## 2012-10-16 MED ORDER — MEPERIDINE HCL 25 MG/ML IJ SOLN: 6.2500 mg | INTRAMUSCULAR | Status: DC | PRN

## 2012-10-16 MED ORDER — IPRATROPIUM-ALBUTEROL 20-100 MCG/ACT IN AERS
1.0000 | INHALATION_SPRAY | Freq: Four times a day (QID) | RESPIRATORY_TRACT | Status: DC | PRN
  Filled 2012-10-16: qty 4

## 2012-10-16 MED ORDER — PROMETHAZINE HCL 25 MG/ML IJ SOLN: 6.2500 mg | INTRAMUSCULAR | Status: DC | PRN

## 2012-10-16 MED ORDER — METFORMIN HCL 500 MG PO TABS
500.0000 mg | ORAL_TABLET | Freq: Every day | ORAL | Status: DC | PRN
  Filled 2012-10-16: qty 1

## 2012-10-16 MED ORDER — MIDAZOLAM HCL 2 MG/2ML IJ SOLN: 0.5000 mg | Freq: Once | INTRAMUSCULAR | Status: DC | PRN

## 2012-10-16 MED ORDER — LACTATED RINGERS IV SOLN
INTRAVENOUS | Status: DC | PRN
  Administered 2012-10-16 (×2): via INTRAVENOUS

## 2012-10-16 MED ORDER — ALBUTEROL SULFATE (5 MG/ML) 0.5% IN NEBU: 2.5000 mg | INHALATION_SOLUTION | RESPIRATORY_TRACT | Status: DC | PRN

## 2012-10-16 MED ORDER — OXYCODONE-ACETAMINOPHEN 10-325 MG PO TABS: 2.0000 | ORAL_TABLET | ORAL | Status: DC | PRN

## 2012-10-16 MED ORDER — MENTHOL 3 MG MT LOZG: 1.0000 | LOZENGE | OROMUCOSAL | Status: DC | PRN

## 2012-10-16 MED ORDER — BUPIVACAINE-EPINEPHRINE 0.5% -1:200000 IJ SOLN
INTRAMUSCULAR | Status: DC | PRN
  Administered 2012-10-16: 10 mL

## 2012-10-16 MED ORDER — GLYCOPYRROLATE 0.2 MG/ML IJ SOLN
INTRAMUSCULAR | Status: DC | PRN
  Administered 2012-10-16: 0.6 mg via INTRAVENOUS

## 2012-10-16 MED ORDER — 0.9 % SODIUM CHLORIDE (POUR BTL) OPTIME
TOPICAL | Status: DC | PRN
  Administered 2012-10-16: 1000 mL

## 2012-10-16 MED ORDER — ALBUTEROL SULFATE HFA 108 (90 BASE) MCG/ACT IN AERS
2.0000 | INHALATION_SPRAY | RESPIRATORY_TRACT | Status: DC | PRN
  Filled 2012-10-16: qty 6.7

## 2012-10-16 MED ORDER — PHENYLEPHRINE HCL 10 MG/ML IJ SOLN
INTRAMUSCULAR | Status: DC | PRN
  Administered 2012-10-16: 40 ug via INTRAVENOUS

## 2012-10-16 MED ORDER — HEMOSTATIC AGENTS (NO CHARGE) OPTIME
TOPICAL | Status: DC | PRN
  Administered 2012-10-16: 1 via TOPICAL

## 2012-10-16 MED ORDER — ACETAMINOPHEN 650 MG RE SUPP: 650.0000 mg | RECTAL | Status: DC | PRN

## 2012-10-16 MED ORDER — PHENOL 1.4 % MT LIQD: 1.0000 | OROMUCOSAL | Status: DC | PRN

## 2012-10-16 MED ORDER — DEXAMETHASONE 4 MG PO TABS
4.0000 mg | ORAL_TABLET | Freq: Four times a day (QID) | ORAL | Status: AC
  Administered 2012-10-16: 4 mg via ORAL
  Filled 2012-10-16 (×3): qty 1

## 2012-10-16 MED ORDER — OXYCODONE-ACETAMINOPHEN 5-325 MG PO TABS
2.0000 | ORAL_TABLET | ORAL | Status: DC | PRN
  Administered 2012-10-17 (×2): 2 via ORAL
  Filled 2012-10-16 (×2): qty 2

## 2012-10-16 MED ORDER — DIAZEPAM 5 MG PO TABS
5.0000 mg | ORAL_TABLET | Freq: Four times a day (QID) | ORAL | Status: DC | PRN
  Administered 2012-10-16: 5 mg via ORAL

## 2012-10-16 MED ORDER — LINAGLIPTIN 5 MG PO TABS
5.0000 mg | ORAL_TABLET | Freq: Every day | ORAL | Status: DC | PRN
  Filled 2012-10-16: qty 1

## 2012-10-16 MED ORDER — OXYCODONE HCL 5 MG PO TABS
ORAL_TABLET | ORAL | Status: AC
  Filled 2012-10-16: qty 1

## 2012-10-16 MED ORDER — ONDANSETRON HCL 4 MG/2ML IJ SOLN
INTRAMUSCULAR | Status: DC | PRN
  Administered 2012-10-16: 4 mg via INTRAVENOUS

## 2012-10-16 MED ORDER — THROMBIN 5000 UNITS EX SOLR
CUTANEOUS | Status: DC | PRN
  Administered 2012-10-16: 5000 [IU] via TOPICAL

## 2012-10-16 MED ORDER — ALUM & MAG HYDROXIDE-SIMETH 200-200-20 MG/5ML PO SUSP: 30.0000 mL | Freq: Four times a day (QID) | ORAL | Status: DC | PRN

## 2012-10-16 MED ORDER — ROCURONIUM BROMIDE 100 MG/10ML IV SOLN
INTRAVENOUS | Status: DC | PRN
  Administered 2012-10-16: 50 mg via INTRAVENOUS

## 2012-10-16 MED ORDER — HYDROMORPHONE HCL PF 1 MG/ML IJ SOLN
0.2500 mg | INTRAMUSCULAR | Status: DC | PRN
  Administered 2012-10-16 (×4): 0.5 mg via INTRAVENOUS

## 2012-10-16 SURGICAL SUPPLY — 64 items
BAG DECANTER FOR FLEXI CONT (MISCELLANEOUS) ×2 IMPLANT
BENZOIN TINCTURE PRP APPL 2/3 (GAUZE/BANDAGES/DRESSINGS) ×2 IMPLANT
BIT DRILL NEURO 2X3.1 SFT TUCH (MISCELLANEOUS) ×1 IMPLANT
BLADE SURG 15 STRL LF DISP TIS (BLADE) ×1 IMPLANT
BLADE SURG 15 STRL SS (BLADE) ×1
BLADE ULTRA TIP 2M (BLADE) ×2 IMPLANT
BRUSH SCRUB EZ PLAIN DRY (MISCELLANEOUS) ×2 IMPLANT
BUR BARREL STRAIGHT FLUTE 4.0 (BURR) ×2 IMPLANT
BUR MATCHSTICK NEURO 3.0 LAGG (BURR) ×2 IMPLANT
CANISTER SUCTION 2500CC (MISCELLANEOUS) ×2 IMPLANT
CLIP TI MEDIUM 6 (CLIP) ×2 IMPLANT
CLOTH BEACON ORANGE TIMEOUT ST (SAFETY) ×2 IMPLANT
CONT SPEC 4OZ CLIKSEAL STRL BL (MISCELLANEOUS) ×2 IMPLANT
CORDS BIPOLAR (ELECTRODE) ×2 IMPLANT
COVER MAYO STAND STRL (DRAPES) ×2 IMPLANT
DRAIN JACKSON PRATT 10MM FLAT (MISCELLANEOUS) ×2 IMPLANT
DRAPE LAPAROTOMY 100X72 PEDS (DRAPES) ×2 IMPLANT
DRAPE MICROSCOPE LEICA (MISCELLANEOUS) ×2 IMPLANT
DRAPE POUCH INSTRU U-SHP 10X18 (DRAPES) ×2 IMPLANT
DRAPE SURG 17X23 STRL (DRAPES) ×4 IMPLANT
DRILL NEURO 2X3.1 SOFT TOUCH (MISCELLANEOUS) ×2
ELECT BLADE 4.0 EZ CLEAN MEGAD (MISCELLANEOUS) ×2
ELECT REM PT RETURN 9FT ADLT (ELECTROSURGICAL) ×2
ELECTRODE BLDE 4.0 EZ CLN MEGD (MISCELLANEOUS) ×1 IMPLANT
ELECTRODE REM PT RTRN 9FT ADLT (ELECTROSURGICAL) ×1 IMPLANT
EVACUATOR SILICONE 100CC (DRAIN) ×2 IMPLANT
GAUZE SPONGE 4X4 16PLY XRAY LF (GAUZE/BANDAGES/DRESSINGS) ×2 IMPLANT
GLOVE BIO SURGEON STRL SZ8.5 (GLOVE) ×2 IMPLANT
GLOVE ECLIPSE 7.0 STRL STRAW (GLOVE) ×2 IMPLANT
GLOVE EXAM NITRILE LRG STRL (GLOVE) IMPLANT
GLOVE EXAM NITRILE MD LF STRL (GLOVE) ×2 IMPLANT
GLOVE EXAM NITRILE XL STR (GLOVE) IMPLANT
GLOVE EXAM NITRILE XS STR PU (GLOVE) IMPLANT
GLOVE INDICATOR 7.5 STRL GRN (GLOVE) ×4 IMPLANT
GLOVE SS BIOGEL STRL SZ 8 (GLOVE) ×1 IMPLANT
GLOVE SS N UNI LF 7.0 STRL (GLOVE) ×6 IMPLANT
GLOVE SUPERSENSE BIOGEL SZ 8 (GLOVE) ×1
GOWN BRE IMP SLV AUR LG STRL (GOWN DISPOSABLE) ×2 IMPLANT
GOWN BRE IMP SLV AUR XL STRL (GOWN DISPOSABLE) ×2 IMPLANT
KIT BASIN OR (CUSTOM PROCEDURE TRAY) ×2 IMPLANT
KIT ROOM TURNOVER OR (KITS) ×2 IMPLANT
MARKER SKIN DUAL TIP RULER LAB (MISCELLANEOUS) ×2 IMPLANT
NEEDLE HYPO 22GX1.5 SAFETY (NEEDLE) ×4 IMPLANT
NEEDLE SPNL 18GX3.5 QUINCKE PK (NEEDLE) ×2 IMPLANT
NS IRRIG 1000ML POUR BTL (IV SOLUTION) ×2 IMPLANT
PACK LAMINECTOMY NEURO (CUSTOM PROCEDURE TRAY) ×2 IMPLANT
PEEK VISTA 14X14X7MM (Peek) ×2 IMPLANT
PIN DISTRACTION 14MM (PIN) ×4 IMPLANT
PLATE ANT CERV XTEND 1 LV 16 (Plate) ×2 IMPLANT
PUTTY 2.5ML ACTIFUSE ABX (Putty) ×2 IMPLANT
RUBBERBAND STERILE (MISCELLANEOUS) ×4 IMPLANT
SCREW XTD VAR 4.2 SELF TAP (Screw) ×4 IMPLANT
SCREW XTEND SELFTAP VAR 4.6X14 (Screw) ×4 IMPLANT
SPONGE GAUZE 4X4 12PLY (GAUZE/BANDAGES/DRESSINGS) ×2 IMPLANT
SPONGE INTESTINAL PEANUT (DISPOSABLE) ×6 IMPLANT
SPONGE SURGIFOAM ABS GEL SZ50 (HEMOSTASIS) ×2 IMPLANT
STRIP CLOSURE SKIN 1/2X4 (GAUZE/BANDAGES/DRESSINGS) ×2 IMPLANT
SUT VIC AB 0 CT1 27 (SUTURE) ×1
SUT VIC AB 0 CT1 27XBRD ANTBC (SUTURE) ×1 IMPLANT
SUT VIC AB 3-0 SH 8-18 (SUTURE) ×2 IMPLANT
SYR 20ML ECCENTRIC (SYRINGE) ×2 IMPLANT
TOWEL OR 17X24 6PK STRL BLUE (TOWEL DISPOSABLE) ×2 IMPLANT
TOWEL OR 17X26 10 PK STRL BLUE (TOWEL DISPOSABLE) ×2 IMPLANT
WATER STERILE IRR 1000ML POUR (IV SOLUTION) ×2 IMPLANT

## 2012-10-16 NOTE — Progress Notes (Signed)
ANTIBIOTIC CONSULT NOTE - INITIAL  Pharmacy Consult for Vancomycin Indication: post op surgical prophylaxis  Allergies  Allergen Reactions  . Penicillins Anaphylaxis    Patient Measurements: Height: 5\' 8"  (172.7 cm) (from 10/11/12) Weight: 214 lb 11.2 oz (97.387 kg) (from 10/11/12) IBW/kg (Calculated) : 68.4   Vital Signs: Temp: 98.7 F (37.1 C) (09/24 1625) Temp src: Oral (09/24 0912) BP: 123/76 mmHg (09/24 1709) Pulse Rate: 93 (09/24 1709) Intake/Output from previous day:   Intake/Output from this shift: Total I/O In: 1250 [I.V.:1000; IV Piggyback:250] Out: 205 [Blood:205]  Labs: No results found for this basename: WBC, HGB, PLT, LABCREA, CREATININE,  in the last 72 hours Estimated Creatinine Clearance: 89.9 ml/min (by C-G formula based on Cr of 1.1). No results found for this basename: VANCOTROUGH, Leodis Binet, VANCORANDOM, GENTTROUGH, GENTPEAK, GENTRANDOM, TOBRATROUGH, TOBRAPEAK, TOBRARND, AMIKACINPEAK, AMIKACINTROU, AMIKACIN,  in the last 72 hours   Microbiology: Recent Results (from the past 720 hour(s))  SURGICAL PCR SCREEN     Status: None   Collection Time    10/11/12  8:36 AM      Result Value Range Status   MRSA, PCR NEGATIVE  NEGATIVE Final   Staphylococcus aureus NEGATIVE  NEGATIVE Final   Comment:            The Xpert SA Assay (FDA     approved for NASAL specimens     in patients over 103 years of age),     is one component of     a comprehensive surveillance     program.  Test performance has     been validated by [employer] for patients greater     than or equal to 36 year old.     It is not intended     to diagnose infection nor to     guide or monitor treatment.    Medical History: Past Medical History  Diagnosis Date  . COPD (chronic obstructive pulmonary disease)   . GERD (gastroesophageal reflux disease)   . Headache(784.0)   . 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 Diabetes mellitus without complication   .  Pneumonia     hx    Medications:  Prescriptions prior to admission  Medication Sig Dispense Refill  . albuterol (VENTOLIN HFA) 108 (90 BASE) MCG/ACT inhaler Inhale 2 puffs into the lungs every 4 (four) hours as needed for wheezing or shortness of breath.  1 Inhaler  11  . diazepam (VALIUM) 5 MG tablet Take 1 tablet (5 mg total) by mouth every 6 (six) hours as needed for anxiety.  120 tablet  0  . fluticasone (FLONASE) 50 MCG/ACT nasal spray Place 2 sprays into the nose 2 (two) times daily.  16 g  12  . Fluticasone-Salmeterol (ADVAIR) 250-50 MCG/DOSE AEPB Inhale 1 puff into the lungs every 12 (twelve) hours.      . Fluticasone-Salmeterol (ADVAIR) 250-50 MCG/DOSE AEPB Inhale 1 puff into the lungs every 12 (twelve) hours.      . Ipratropium-Albuterol (COMBIVENT RESPIMAT) 20-100 MCG/ACT AERS respimat Inhale 1 puff into the lungs every 6 (six) hours as needed for shortness of breath.      Marland Kitchen ipratropium-albuterol (DUONEB) 0.5-2.5 (3) MG/3ML SOLN Take 3 mLs by nebulization 4 (four) times daily as needed (shortness of breath). Dx 496      . omeprazole (PRILOSEC) 40 MG capsule Take 40 mg by mouth daily.      Marland Kitchen oxyCODONE-acetaminophen (  PERCOCET) 10-325 MG per tablet Take 2 tablets by mouth every 4 (four) hours as needed for pain.      . rosuvastatin (CRESTOR) 10 MG tablet Take 10 mg by mouth daily.      . sitaGLIPtan-metformin (JANUMET) 50-500 MG per tablet Take 1 tablet by mouth as needed (Patient only takes a couple times a week when he feels he needs it for increase blood sugar.).        Scheduled:  . atorvastatin  20 mg Oral q1800  . dexamethasone  4 mg Intravenous Q6H   Or  . dexamethasone  4 mg Oral Q6H  . docusate sodium  100 mg Oral BID  . fluticasone  2 spray Each Nare BID  . insulin aspart  0-20 Units Subcutaneous Q4H  . mometasone-formoterol  2 puff Inhalation BID  . [START ON 10/17/2012] pantoprazole  80 mg Oral Daily   Assessment: 52 y.o male s/p cervical decompression/discectomy  fusion.  One 10 mm flat Jackson-Pratt drain in the prevertebral space thus vancomycin ordered to continue until physcian discontiues.  Preop vancomycin 1 gm IV given today @ 12:15.  Preop labs, SCr = 1.1, estimated CrCl ~ 89 ml/min.    Goal of Therapy:  Vancomycin trough level 10-15 mcg/ml  Plan:  Vancomycin 1250 mg IV q12 hours. Monitor clinical status, renal function, and culture results if ordered.   Noah Delaine, RPh Clinical Pharmacist Pager: [phone]  10/16/2012,5:48 PM

## 2012-10-16 NOTE — Preoperative (Signed)
Beta Blockers   Reason not to administer Beta Blockers:Not Applicable 

## 2012-10-16 NOTE — Op Note (Signed)
Brief history: The patient is a 52 year old black male who I performed a C3-4, C4-5 and C5-6 anterior cervical discectomy, fusion, and plating on about 2 years ago. The patient initially did well but has developed recurrent neck and left arm pain consistent with a cervical radiculopathy. He has failed medical management and was worked up with a cervical MRI. This demonstrated a herniated disc at C6-7. I discussed the various treatment options with the patient including surgery. He has weighed the risks, benefits, and alternatives surgery and decided proceed with an exploration of cervical fusion/removal of his old cervical plate with a A5-4 anterior cervical discectomy, fusion, and plating.  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 Delma Officer  Asst.: Dr.Neelish Rica Records  Anesthesia: Gen. endotracheal  Estimated blood loss: 75 cc  Drains: One 10 mm flat Jackson-Pratt drain in the prevertebral space.  Complications: None  Description of procedure: The patient was brought to the operating room by the anesthesia team. General endotracheal anesthesia was induced. A roll was placed under the patient's shoulders to keep the neck in the neutral position. The patient's anterior cervical region was then prepared with Betadine scrub and Betadine solution. Sterile drapes were applied.  The area to be incised was then injected with Marcaine with epinephrine solution. I then used a scalpel to make a transverse incision in the patient's left  anterior neck incising through the patient's old surgical scar.. I used the Metzenbaum scissors to divide the platysmal muscle and then to dissect medial to the sternocleidomastoid muscle, jugular vein, and carotid artery. I carefully through the scar tissue and dissected down towards the anterior cervical spine identifying the esophagus and retracting it medially. Then using Kitner swabs to clear soft tissue from the anterior cervical spine, exposing the old cervical plate.. We cleared the scar tissue exposing the old cervical plate. We unlocked the cams, remove the screws, and removed the plate. I inspected the arthrodesis at C3-4, C4-5 and C5-6 it appeared solid.  I then used electrocautery to detach the medial border of the longus colli muscle bilaterally from the C6-7 intervertebral disc spaces. I then inserted the Caspar self-retaining retractor underneath the longus colli muscle bilaterally to provide exposure.  We then incised the intervertebral disc at C6-7. We then performed a partial intervertebral discectomy with a pituitary forceps and the Karlin curettes. I then inserted distraction screws into the vertebral bodies at C6 and C7. We then distracted the interspace. We then used the high-speed drill to decorticate the vertebral endplates at C6-7, to drill away the remainder of the intervertebral disc, to drill away some posterior spondylosis, and to thin out the posterior longitudinal ligament. I then incised ligament with the arachnoid knife. We then removed the ligament with a Kerrison punches undercutting the vertebral endplates and decompressing the thecal sac. We then performed foraminotomies about the bilateral C7 nerve roots. This completed the decompression at this level.   We now turned our to attention to the interbody fusion. We used the trial spacers to determine the appropriate size for the interbody prosthesis. We then pre-filled prosthesis with a combination of local morcellized  autograft bone that we obtained during decompression as  well as Actifuse bone graft extender. We then inserted the prosthesis into the distracted interspace at C6-7. We then removed the distraction screws. There was a good snug fit of the prosthesis in the interspace.   Having completed the fusion we now turned attention to the anterior spinal instrumentation. We used the high-speed drill to drill away some anterior spondylosis at the disc spaces so that the plate lay down flat. We selected the appropriate length titanium anterior cervical plate. We laid it along the anterior aspect of the vertebral bodies from C6-C7. We then drilled to a mm holes at C7, we used the old holes at C6. We then secured the plate to the vertebral bodies by placing two 14 mm self-tapping screws at C6 and C7. We then obtained intraoperative radiograph. The demonstrating good position of the instrumentation. We therefore secured the screws the plate the locking each cam. This completed the instrumentation.  We then obtained hemostasis using bipolar electrocautery. We irrigated the wound out with bacitracin solution. We then removed the retractor. We inspected the esophagus for any damage. There was none apparent. We placed a 10 mm flat Jackson-Pratt drain in the prevertebral space. We tunneled it out through separate stab wound. We then reapproximated patient's platysmal muscle with interrupted 3-0 Vicryl suture. We then reapproximated the subcutaneous tissue with interrupted 3-0 Vicryl suture. The skin was reapproximated with Steri-Strips and benzoin. The wound was then covered with bacitracin ointment. A sterile dressing was applied. The drapes were removed. Patient was subsequently extubated by the anesthesia team and transported to the post anesthesia care unit in stable condition. All sponge instrument and needle counts were reportedly correct at the end of this case.

## 2012-10-16 NOTE — Anesthesia Postprocedure Evaluation (Signed)
  Anesthesia Post-op Note  Patient: Scott [name]  Procedure(s) Performed: Procedure(s): CERVICAL SIX-SEVEN ANTERIOR CERVICAL DECOMPRESSION/DISCECTOMY FUSION WITH INTERBODY PROTHESIS PLATING BONEGRAFT WITH /POSSIBLE HARDWARE REMOVAL OLD PLATE (N/A)  Patient Location: PACU  Anesthesia Type:General  Level of Consciousness: awake, alert , oriented and patient cooperative  Airway and Oxygen Therapy: Patient Spontanous Breathing and Patient connected to nasal cannula oxygen  Post-op Pain: mild  Post-op Assessment: Post-op Vital signs reviewed, Patient's Cardiovascular Status Stable, Respiratory Function Stable, Patent Airway, No signs of Nausea or vomiting and Pain level controlled  Post-op Vital Signs: Reviewed and stable  Complications: No apparent anesthesia complications

## 2012-10-16 NOTE — Progress Notes (Signed)
Ortho tech paged for patient's aspen collar.

## 2012-10-16 NOTE — Progress Notes (Signed)
Patient ID: Scott [name], male   DOB: 05-31-1960, 52 y.o.   MRN: [id number] Subjective:  The patient is somnolent but easily arousable. He looks well. He is in no apparent distress.  Objective: Vital signs in last 24 hours: Temp:  [97.6 F (36.4 C)-98.1 F (36.7 C)] 97.6 F (36.4 C) (09/24 1507) Pulse Rate:  [78] 78 (09/24 0912) Resp:  [18] 18 (09/24 0912) BP: (125)/(79) 125/79 mmHg (09/24 0912) SpO2:  [95 %] 95 % (09/24 0912)  Intake/Output from previous day:   Intake/Output this shift: Total I/O In: 1250 [I.V.:1000; IV Piggyback:250] Out: 205 [Blood:205]  Physical exam the patient is somnolent but easily arousable. He is moving all 4 extremities. Glasgow Coma Scale 13.  His dressing is clean and dry, soft and flat. There is no evidence of hematoma or shift.  Lab Results: No results found for this basename: WBC, HGB, HCT, PLT,  in the last 72 hours BMET No results found for this basename: NA, K, CL, CO2, GLUCOSE, BUN, CREATININE, CALCIUM,  in the last 72 hours  Studies/Results: No results found.  Assessment/Plan: Patient is doing well.  LOS: 0 days     Mica Ramdass D 10/16/2012, 3:11 PM

## 2012-10-16 NOTE — Anesthesia Preprocedure Evaluation (Signed)
Anesthesia Evaluation  Patient identified by MRN, date of birth, ID band Patient awake    Reviewed: Allergy & Precautions, H&P , NPO status , Patient's Chart, lab work & pertinent test results  History of Anesthesia Complications Negative for: history of anesthetic complications  Airway Mallampati: I TM Distance: >3 FB Neck ROM: Full    Dental  (+) Edentulous Upper, Poor Dentition, Dental Advisory Given and Partial Lower   Pulmonary COPD COPD inhaler, Current Smoker,  breath sounds clear to auscultation  Pulmonary exam normal       Cardiovascular - anginaRhythm:Regular Rate:Normal     Neuro/Psych Chronic back pain: narcotics daily    GI/Hepatic Neg liver ROS, GERD-  Medicated and Controlled,  Endo/Other  diabetes (glu 103), Well Controlled, Type obesity  Renal/GU negative Renal ROS     Musculoskeletal   Abdominal (+) + obese,   Peds  Hematology   Anesthesia Other Findings   Reproductive/Obstetrics                           Anesthesia Physical Anesthesia Plan  ASA: III  Anesthesia Plan: General   Post-op Pain Management:    Induction: Intravenous  Airway Management Planned: Oral ETT  Additional Equipment:   Intra-op Plan:   Post-operative Plan: Extubation in OR  Informed Consent: I have reviewed the patients History and Physical, chart, labs and discussed the procedure including the risks, benefits and alternatives for the proposed anesthesia with the patient or authorized representative who has indicated his/her understanding and acceptance.   Dental advisory given  Plan Discussed with: CRNA and Surgeon  Anesthesia Plan Comments: (Plan routine monitors, GETA)        Anesthesia Quick Evaluation

## 2012-10-16 NOTE — H&P (Signed)
Subjective: The patient is a 52 year old white male who I performed a previous anterior cervical discectomy, fusion, and plating. He initially did well but has developed recurrent neck and arm pain consistent with a cervical radiculopathy. He has failed medical management and was worked up with a cervical MRI which demonstrated a herniated disc at C6-7. I discussed the various treatment options with the patient including surgery. He has weighed the risks, benefits, and alternatives surgery and decided proceed with a C6-7 anterior cervicectomy, fusion, and plating.   Past Medical History  Diagnosis Date  . COPD (chronic obstructive pulmonary disease)   . GERD (gastroesophageal reflux disease)   . Headache(784.0)   . 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 Diabetes mellitus without complication   . Pneumonia     hx    Past Surgical History  Procedure Laterality Date  . Tonsillectomy    . Other surgical history      surgery on cheekbone and head for fall 2000  . 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 Multiple tooth extractions    . Anterior cervical decomp/discectomy fusion  12/22/2010    Procedure: ANTERIOR CERVICAL DECOMPRESSION/DISCECTOMY FUSION 3 LEVELS;  Surgeon: Cristi Loron;  Location: [facility];  Service: Neurosurgery;  Laterality: N/A;  Anterior cervical discectomy with fusion cervical three-four, four-five, and five sixCDF with Interbody Prosthesis, plating, and Bone Graft     Allergies  Allergen Reactions  . Penicillins Anaphylaxis    History  Substance Use Topics  . Smoking status: Former Smoker -- 0.30 packs/day for 41 years    Types: Cigarettes    Start date: 03/17/2012    Quit date: 06/07/2012  . Smokeless tobacco: Never Used  . Alcohol Use: No    Family History  Problem Relation Age of Onset  . Emphysema Mother   . COPD Mother   .  Asthma Brother   . Asthma Sister   . Heart disease Maternal Uncle    Prior to Admission medications   Medication Sig Start Date End Date Taking? Authorizing Provider  albuterol (VENTOLIN HFA) 108 (90 BASE) MCG/ACT inhaler Inhale 2 puffs into the lungs every 4 (four) hours as needed for wheezing or shortness of breath. 09/20/12  Yes Leslye Peer, MD  diazepam (VALIUM) 5 MG tablet Take 1 tablet (5 mg total) by mouth every 6 (six) hours as needed for anxiety. 10/02/12  Yes Karen Prueter, PA-C  fluticasone (FLONASE) 50 MCG/ACT nasal spray Place 2 sprays into the nose 2 (two) times daily. 06/21/12 06/21/13 Yes Leslye Peer, MD  Fluticasone-Salmeterol (ADVAIR) 250-50 MCG/DOSE AEPB Inhale 1 puff into the lungs every 12 (twelve) hours.   Yes Historical Provider, MD  Fluticasone-Salmeterol (ADVAIR) 250-50 MCG/DOSE AEPB Inhale 1 puff into the lungs every 12 (twelve) hours.   Yes Historical Provider, MD  Ipratropium-Albuterol (COMBIVENT RESPIMAT) 20-100 MCG/ACT AERS respimat Inhale 1 puff into the lungs every 6 (six) hours as needed for shortness of breath.   Yes Historical Provider, MD  ipratropium-albuterol (DUONEB) 0.5-2.5 (3) MG/3ML SOLN Take 3 mLs by nebulization 4 (four) times daily as needed (shortness of breath). Dx 496 08/06/12  Yes Leslye Peer, MD  omeprazole (PRILOSEC) 40 MG capsule Take 40 mg by mouth daily.   Yes Historical Provider, MD  oxyCODONE-acetaminophen (PERCOCET) 10-325 MG  per tablet Take 2 tablets by mouth every 4 (four) hours as needed for pain.   Yes Historical Provider, MD  rosuvastatin (CRESTOR) 10 MG tablet Take 10 mg by mouth daily.   Yes Historical Provider, MD  sitaGLIPtan-metformin (JANUMET) 50-500 MG per tablet Take 1 tablet by mouth as needed (Patient only takes a couple times a week when he feels he needs it for increase blood sugar.).    Yes Historical Provider, MD     Review of Systems  Positive ROS: As above  All other systems have been reviewed and were otherwise  negative with the exception of those mentioned in the HPI and as above.  Objective: Vital signs in last 24 hours: Temp:  [98.1 F (36.7 C)] 98.1 F (36.7 C) (09/24 0912) Pulse Rate:  [78] 78 (09/24 0912) Resp:  [18] 18 (09/24 0912) BP: (125)/(79) 125/79 mmHg (09/24 0912) SpO2:  [95 %] 95 % (09/24 0912)  General Appearance: Alert, cooperative, no distress, appears stated age Head: Normocephalic, without obvious abnormality, atraumatic Eyes: PERRL, conjunctiva/corneas clear, EOM's intact, fundi benign, both eyes      Ears: Normal TM's and external ear canals, both ears Throat: Lips, mucosa, and tongue normal; teeth and gums normal Neck: Supple, symmetrical, trachea midline, no adenopathy; thyroid: No enlargement/tenderness/nodules; no carotid bruit or JVD. The patient's previous cervical incision is well-healed. Back: Symmetric, no curvature, ROM normal, no CVA tenderness Lungs: Clear to auscultation bilaterally, respirations unlabored Heart: Regular rate and rhythm, S1 and S2 normal, no murmur, rub or gallop Abdomen: Soft, non-tender, bowel sounds active all four quadrants, no masses, no organomegaly Extremities: Extremities normal, atraumatic, no cyanosis or edema Pulses: 2+ and symmetric all extremities Skin: Skin color, texture, turgor normal, no rashes or lesions  NEUROLOGIC:   Mental status: alert and oriented, no aphasia, good attention span, Fund of knowledge/ memory ok Motor Exam - grossly normal Sensory Exam - grossly normal Reflexes:  Coordination - grossly normal Gait - grossly normal Balance - grossly normal Cranial Nerves: I: smell Not tested  II: visual acuity  OS: Normal    OD: Normal   II: visual fields Full to confrontation  II: pupils Equal, round, reactive to light  III,VII: ptosis None  III,IV,VI: extraocular muscles  Full ROM  V: mastication Normal  V: facial light touch sensation  Normal  V,VII: corneal reflex  Present  VII: facial muscle function -  upper  Normal  VII: facial muscle function - lower Normal  VIII: hearing Not tested  IX: soft palate elevation  Normal  IX,X: gag reflex Present  XI: trapezius strength  5/5  XI: sternocleidomastoid strength 5/5  XI: neck flexion strength  5/5  XII: tongue strength  Normal    Data Review Lab Results  Component Value Date   WBC 7.4 10/11/2012   HGB 16.9 10/11/2012   HCT 47.8 10/11/2012   MCV 87.7 10/11/2012   PLT 211 10/11/2012   Lab Results  Component Value Date   NA 139 10/11/2012   K 4.1 10/11/2012   CL 103 10/11/2012   CO2 25 10/11/2012   BUN 10 10/11/2012   CREATININE 1.10 10/11/2012   GLUCOSE 100* 10/11/2012   No results found for this basename: INR, PROTIME    Assessment/Plan: C6-7 herniated disc, cervicalgia, cervical radiculopathy: I discussed situation with the patient. I reviewed his MR scan with them and pointed out the abnormalities. We have discussed the various treatment options including an exploration of cervical fusion with possible removal results cervical  plate and an anterior cervical discectomy fusion and plating at C6-7. I described the surgery to him. I've shown surgical models. We have discussed the risks, benefits, alternatives, and likelihood of achieving our goals with surgery. I advanced all the patient's questions. He has decided proceed with surgery.  COPD: The patient has been seen by his oncologist and told he is at risk for pulmonary complications with general anesthesia. This has been fully discussed with the patient. He is willing to accept this risk.   Norvell Ureste D 10/16/2012 12:02 PM

## 2012-10-16 NOTE — Progress Notes (Signed)
Orthopedic Tech Progress Note Patient Details:  Scott [name] 09/22/1960 [id number]  Patient ID: [name], male   DOB: [birth year], 52 y.o.   MRN: [id number] Called in bio-tech brace order ; spoke with Stacy Gardner order completed by Storm Frisk, Belma Dyches 10/16/2012, 8:27 PM

## 2012-10-16 NOTE — Anesthesia Procedure Notes (Signed)
Procedure Name: Intubation Date/Time: 10/16/2012 12:36 PM Performed by: Gayla Medicus Pre-anesthesia Checklist: Patient identified, Timeout performed, Emergency Drugs available, Suction available and Patient being monitored Patient Re-evaluated:Patient Re-evaluated prior to inductionOxygen Delivery Method: Circle system utilized Preoxygenation: Pre-oxygenation with 100% oxygen Intubation Type: IV induction Ventilation: Mask ventilation without difficulty and Oral airway inserted - appropriate to patient size Laryngoscope Size: Mac and 3 Grade View: Grade I Tube type: Oral Tube size: 7.5 mm Number of attempts: 1 Airway Equipment and Method: Stylet Placement Confirmation: ETT inserted through vocal cords under direct vision,  positive ETCO2 and breath sounds checked- equal and bilateral Secured at: 23 cm Tube secured with: Tape Dental Injury: Teeth and Oropharynx as per pre-operative assessment

## 2012-10-16 NOTE — Transfer of Care (Signed)
Immediate Anesthesia Transfer of Care Note  Patient: Scott [name]  Procedure(s) Performed: Procedure(s): CERVICAL SIX-SEVEN ANTERIOR CERVICAL DECOMPRESSION/DISCECTOMY FUSION WITH INTERBODY PROTHESIS PLATING BONEGRAFT WITH /POSSIBLE HARDWARE REMOVAL OLD PLATE (N/A)  Patient Location: PACU  Anesthesia Type:General  Level of Consciousness: awake and sedated  Airway & Oxygen Therapy: Patient Spontanous Breathing and Patient connected to nasal cannula oxygen  Post-op Assessment: Report given to PACU RN, Post -op Vital signs reviewed and stable and Patient moving all extremities  Post vital signs: Reviewed and stable  Complications: No apparent anesthesia complications

## 2012-10-17 ENCOUNTER — Encounter (HOSPITAL_COMMUNITY): Payer: Self-pay | Admitting: Neurosurgery

## 2012-10-17 LAB — GLUCOSE, CAPILLARY
Glucose-Capillary: 130 mg/dL — ABNORMAL HIGH (ref 70–99)
Glucose-Capillary: 131 mg/dL — ABNORMAL HIGH (ref 70–99)

## 2012-10-17 NOTE — Progress Notes (Signed)
UR COMPLETED  

## 2012-10-17 NOTE — Discharge Summary (Signed)
Physician Discharge Summary  Patient ID: Scott [name] MRN: [id number] DOB/AGE: Aug 16, 1960 52 y.o.  Admit date: 10/16/2012 Discharge date: 10/17/2012  Admission Diagnoses:cervical stenosis  Discharge Diagnoses: same Active Problems:   * No active hospital problems. *   Discharged Condition: no weakness  Hospital Course: surgery  Consults: none  Significant Diagnostic Studies: mri  Treatments: cervical fusion  Discharge Exam: Blood pressure 133/94, pulse 103, temperature 98.7 F (37.1 C), temperature source Oral, resp. rate 18, height 5\' 8"  (1.727 m), weight 97.387 kg (214 lb 11.2 oz), SpO2 96.00%. Ambulating, no weakness  Disposition: home     Medication List    ASK your doctor about these medications       albuterol 108 (90 BASE) MCG/ACT inhaler  Commonly known as:  VENTOLIN HFA  Inhale 2 puffs into the lungs every 4 (four) hours as needed for wheezing or shortness of breath.     ipratropium-albuterol 0.5-2.5 (3) MG/3ML Soln  Commonly known as:  DUONEB  Take 3 mLs by nebulization 4 (four) times daily as needed (shortness of breath). Dx 496     COMBIVENT RESPIMAT 20-100 MCG/ACT Aers respimat  Generic drug:  Ipratropium-Albuterol  Inhale 1 puff into the lungs every 6 (six) hours as needed for shortness of breath.     diazepam 5 MG tablet  Commonly known as:  VALIUM  Take 1 tablet (5 mg total) by mouth every 6 (six) hours as needed for anxiety.     fluticasone 50 MCG/ACT nasal spray  Commonly known as:  FLONASE  Place 2 sprays into the nose 2 (two) times daily.     Fluticasone-Salmeterol 250-50 MCG/DOSE Aepb  Commonly known as:  ADVAIR  Inhale 1 puff into the lungs every 12 (twelve) hours.     Fluticasone-Salmeterol 250-50 MCG/DOSE Aepb  Commonly known as:  ADVAIR  Inhale 1 puff into the lungs every 12 (twelve) hours.     omeprazole 40 MG capsule  Commonly known as:  PRILOSEC  Take 40 mg by mouth daily.     oxyCODONE-acetaminophen 10-325 MG per tablet   Commonly known as:  PERCOCET  Take 2 tablets by mouth every 4 (four) hours as needed for pain.     rosuvastatin 10 MG tablet  Commonly known as:  CRESTOR  Take 10 mg by mouth daily.     sitaGLIPtin-metformin 50-500 MG per tablet  Commonly known as:  JANUMET  Take 1 tablet by mouth as needed (Patient only takes a couple times a week when he feels he needs it for increase blood sugar.).         Signed: Karn Cassis 10/17/2012, 9:42 AM

## 2012-10-30 ENCOUNTER — Encounter: Payer: PRIVATE HEALTH INSURANCE | Admitting: Physical Medicine and Rehabilitation

## 2012-10-31 ENCOUNTER — Ambulatory Visit: Payer: Medicare Other | Admitting: Physical Medicine and Rehabilitation

## 2013-01-19 ENCOUNTER — Emergency Department (HOSPITAL_COMMUNITY)
Admission: EM | Admit: 2013-01-19 | Discharge: 2013-01-19 | Disposition: A | Discharge status: Home / Self Care | Payer: PRIVATE HEALTH INSURANCE | Attending: Emergency Medicine | Admitting: Emergency Medicine

## 2013-01-19 ENCOUNTER — Emergency Department (HOSPITAL_COMMUNITY): Payer: PRIVATE HEALTH INSURANCE

## 2013-01-19 ENCOUNTER — Encounter (HOSPITAL_COMMUNITY): Payer: Self-pay | Admitting: Emergency Medicine

## 2013-01-19 DIAGNOSIS — IMO0002 Reserved for concepts with insufficient information to code with codable children: Secondary | ICD-10-CM | POA: Insufficient documentation

## 2013-01-19 DIAGNOSIS — Z8701 Personal history of pneumonia (recurrent): Secondary | ICD-10-CM | POA: Insufficient documentation

## 2013-01-19 DIAGNOSIS — K219 Gastro-esophageal reflux disease without esophagitis: Secondary | ICD-10-CM | POA: Diagnosis not present

## 2013-01-19 DIAGNOSIS — Z880 Allergy status to penicillin: Secondary | ICD-10-CM | POA: Insufficient documentation

## 2013-01-19 DIAGNOSIS — M129 Arthropathy, unspecified: Secondary | ICD-10-CM | POA: Insufficient documentation

## 2013-01-19 DIAGNOSIS — R0602 Shortness of breath: Secondary | ICD-10-CM | POA: Diagnosis present

## 2013-01-19 DIAGNOSIS — J441 Chronic obstructive pulmonary disease with (acute) exacerbation: Secondary | ICD-10-CM

## 2013-01-19 DIAGNOSIS — J45902 Unspecified asthma with status asthmaticus: Secondary | ICD-10-CM | POA: Insufficient documentation

## 2013-01-19 DIAGNOSIS — M79601 Pain in right arm: Secondary | ICD-10-CM

## 2013-01-19 DIAGNOSIS — J449 Chronic obstructive pulmonary disease, unspecified: Secondary | ICD-10-CM | POA: Diagnosis not present

## 2013-01-19 DIAGNOSIS — E119 Type 2 diabetes mellitus without complications: Secondary | ICD-10-CM | POA: Insufficient documentation

## 2013-01-19 DIAGNOSIS — Z79899 Other long term (current) drug therapy: Secondary | ICD-10-CM | POA: Insufficient documentation

## 2013-01-19 DIAGNOSIS — M79609 Pain in unspecified limb: Secondary | ICD-10-CM | POA: Diagnosis not present

## 2013-01-19 DIAGNOSIS — Z87891 Personal history of nicotine dependence: Secondary | ICD-10-CM | POA: Insufficient documentation

## 2013-01-19 LAB — PRO B NATRIURETIC PEPTIDE: Pro B Natriuretic peptide (BNP): 45.2 pg/mL (ref 0–125)

## 2013-01-19 LAB — CBC
HCT: 49 % (ref 39.0–52.0)
Hemoglobin: 17.1 g/dL — ABNORMAL HIGH (ref 13.0–17.0)
MCH: 31.1 pg (ref 26.0–34.0)
MCHC: 34.9 g/dL (ref 30.0–36.0)
MCV: 89.3 fL (ref 78.0–100.0)
Platelets: 206 10*3/uL (ref 150–400)
RBC: 5.49 MIL/uL (ref 4.22–5.81)

## 2013-01-19 LAB — POCT I-STAT TROPONIN I: Troponin i, poc: 0 ng/mL (ref 0.00–0.08)

## 2013-01-19 LAB — BASIC METABOLIC PANEL
CO2: 24 mEq/L (ref 19–32)
Calcium: 9.2 mg/dL (ref 8.4–10.5)
Creatinine, Ser: 1 mg/dL (ref 0.50–1.35)
GFR calc non Af Amer: 85 mL/min — ABNORMAL LOW (ref >90)
Glucose, Bld: 97 mg/dL (ref 70–99)
Sodium: 140 mEq/L (ref 135–145)

## 2013-01-19 MED ORDER — SODIUM CHLORIDE 0.9 % IV BOLUS (SEPSIS): 500.0000 mL | INTRAVENOUS | Status: DC

## 2013-01-19 MED ORDER — ALBUTEROL SULFATE (5 MG/ML) 0.5% IN NEBU: 2.5000 mg | INHALATION_SOLUTION | RESPIRATORY_TRACT | Status: DC | PRN

## 2013-01-19 MED ORDER — OXYCODONE-ACETAMINOPHEN 10-325 MG PO TABS: 1.0000 | ORAL_TABLET | Freq: Four times a day (QID) | ORAL | Status: DC | PRN

## 2013-01-19 MED ORDER — METHYLPREDNISOLONE SODIUM SUCC 125 MG IJ SOLR
125.0000 mg | Freq: Once | INTRAMUSCULAR | Status: AC
  Administered 2013-01-19: 125 mg via INTRAVENOUS
  Filled 2013-01-19: qty 2

## 2013-01-19 MED ORDER — ALBUTEROL SULFATE HFA 108 (90 BASE) MCG/ACT IN AERS: 1.0000 | INHALATION_SPRAY | Freq: Four times a day (QID) | RESPIRATORY_TRACT | Status: DC | PRN

## 2013-01-19 MED ORDER — IPRATROPIUM BROMIDE 0.02 % IN SOLN
0.5000 mg | RESPIRATORY_TRACT | Status: DC
  Administered 2013-01-19: 0.5 mg via RESPIRATORY_TRACT
  Filled 2013-01-19: qty 2.5

## 2013-01-19 MED ORDER — DIAZEPAM 5 MG PO TABS: 5.0000 mg | ORAL_TABLET | Freq: Four times a day (QID) | ORAL | Status: DC | PRN

## 2013-01-19 MED ORDER — PREDNISONE 50 MG PO TABS: ORAL_TABLET | ORAL | Status: AC

## 2013-01-19 MED ORDER — SODIUM CHLORIDE 0.9 % IV BOLUS (SEPSIS)
1000.0000 mL | INTRAVENOUS | Status: AC
  Administered 2013-01-19: 1000 mL via INTRAVENOUS

## 2013-01-19 MED ORDER — ALBUTEROL SULFATE (5 MG/ML) 0.5% IN NEBU
2.5000 mg | INHALATION_SOLUTION | RESPIRATORY_TRACT | Status: DC
  Administered 2013-01-19: 2.5 mg via RESPIRATORY_TRACT
  Filled 2013-01-19: qty 0.5

## 2013-01-19 MED ORDER — HYDROMORPHONE HCL PF 1 MG/ML IJ SOLN
1.0000 mg | Freq: Once | INTRAMUSCULAR | Status: AC
  Administered 2013-01-19: 1 mg via INTRAVENOUS
  Filled 2013-01-19: qty 1

## 2013-01-19 MED ORDER — ALBUTEROL SULFATE (5 MG/ML) 0.5% IN NEBU
5.0000 mg | INHALATION_SOLUTION | Freq: Once | RESPIRATORY_TRACT | Status: AC
  Administered 2013-01-19: 5 mg via RESPIRATORY_TRACT
  Filled 2013-01-19: qty 1

## 2013-01-19 NOTE — ED Notes (Signed)
Pt c/o right arm pain and numbness x 3 weeks and increased SOB x several days

## 2013-01-19 NOTE — ED Provider Notes (Signed)
CSN: [id number]     Arrival date & time 01/19/13  1645 History   First MD Initiated Contact with Patient 01/19/13 2019     Chief Complaint  Patient presents with  . Shortness of Breath  . Arm Pain   (Consider location/radiation/quality/duration/timing/severity/associated sxs/prior Treatment) Patient is a 52 y.o. male presenting with shortness of breath and arm pain. The history is provided by the patient.  Shortness of Breath Severity:  Mild Onset quality:  Gradual Duration:  4 days Timing:  Constant Progression:  Worsening Chronicity:  Recurrent Context: URI   Relieved by:  Nothing Worsened by:  Nothing tried Ineffective treatments:  None tried Associated symptoms: no abdominal pain, no chest pain, no cough, no fever, no headaches, no neck pain, no sputum production and no vomiting   Arm Pain Associated symptoms include shortness of breath. Pertinent negatives include no chest pain, no abdominal pain and no headaches.    Past Medical History  Diagnosis Date  . COPD (chronic obstructive pulmonary disease)   . GERD (gastroesophageal reflux disease)   . Headache(784.0)   . 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 Diabetes mellitus without complication   . Pneumonia     hx   Past Surgical History  Procedure Laterality Date  . Tonsillectomy    . Other surgical history      surgery on cheekbone and head for fall 2000  . 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 Multiple tooth extractions    . Anterior cervical decomp/discectomy fusion  12/22/2010    Procedure: ANTERIOR CERVICAL DECOMPRESSION/DISCECTOMY FUSION 3 LEVELS;  Surgeon: Cristi Loron;  Location: [facility];  Service: Neurosurgery;  Laterality: N/A;  Anterior cervical discectomy with fusion cervical three-four, four-five, and five sixCDF with Interbody Prosthesis, plating, and Bone Graft   .  Anterior cervical decomp/discectomy fusion N/A 10/16/2012    Procedure: CERVICAL SIX-SEVEN ANTERIOR CERVICAL DECOMPRESSION/DISCECTOMY FUSION WITH INTERBODY PROTHESIS PLATING BONEGRAFT WITH /POSSIBLE HARDWARE REMOVAL OLD PLATE;  Surgeon: Cristi Loron, MD;  Location: [facility];  Service: Neurosurgery;  Laterality: N/A;   Family History  Problem Relation Age of Onset  . Emphysema Mother   . COPD Mother   . Asthma Brother   . Asthma Sister   . Heart disease Maternal Uncle    History  Substance Use Topics  . Smoking status: Former Smoker -- 0.30 packs/day for 41 years    Types: Cigarettes    Start date: 03/17/2012    Quit date: 06/07/2012  . Smokeless tobacco: Never Used  . Alcohol Use: No    Review of Systems  Constitutional: Negative for fever.  HENT: Negative for drooling and rhinorrhea.   Eyes: Negative for pain.  Respiratory: Positive for shortness of breath. Negative for cough and sputum production.   Cardiovascular: Negative for chest pain and leg swelling.  Gastrointestinal: Negative for nausea, vomiting, abdominal pain and diarrhea.  Genitourinary: Negative for dysuria and hematuria.  Musculoskeletal: Negative for gait problem and neck pain.       Right arm pain  Skin: Negative for color change.  Neurological: Negative for numbness and headaches.  Hematological: Negative for adenopathy.  Psychiatric/Behavioral: Negative for behavioral problems.  All other systems reviewed and are negative.    Allergies  Penicillins  Home Medications   Current Outpatient  Rx  Name  Route  Sig  Dispense  Refill  . albuterol (VENTOLIN HFA) 108 (90 BASE) MCG/ACT inhaler   Inhalation   Inhale 2 puffs into the lungs every 4 (four) hours as needed for wheezing or shortness of breath.   1 Inhaler   11   . diazepam (VALIUM) 5 MG tablet   Oral   Take 1 tablet (5 mg total) by mouth every 6 (six) hours as needed for anxiety.   120 tablet   0   . fluticasone (FLONASE) 50 MCG/ACT  nasal spray   Nasal   Place 2 sprays into the nose 2 (two) times daily.   16 g   12   . Fluticasone-Salmeterol (ADVAIR) 250-50 MCG/DOSE AEPB   Inhalation   Inhale 1 puff into the lungs every 12 (twelve) hours.         . Ipratropium-Albuterol (COMBIVENT RESPIMAT) 20-100 MCG/ACT AERS respimat   Inhalation   Inhale 1 puff into the lungs every 6 (six) hours as needed for shortness of breath.         Marland Kitchen ipratropium-albuterol (DUONEB) 0.5-2.5 (3) MG/3ML SOLN   Nebulization   Take 3 mLs by nebulization 4 (four) times daily as needed (shortness of breath). Dx 496         . omeprazole (PRILOSEC) 40 MG capsule   Oral   Take 40 mg by mouth daily.         Marland Kitchen oxyCODONE-acetaminophen (PERCOCET) 10-325 MG per tablet   Oral   Take 2 tablets by mouth every 4 (four) hours as needed for pain.          BP 138/94  Pulse 110  Temp(Src) 98.3 F (36.8 C) (Oral)  Resp 23  Ht 5\' 8"  (1.727 m)  Wt 223 lb (101.152 kg)  BMI 33.91 kg/m2  SpO2 98% Physical Exam  Nursing note and vitals reviewed. Constitutional: He is oriented to person, place, and time. He appears well-developed and well-nourished.  HENT:  Head: Normocephalic and atraumatic.  Right Ear: External ear normal.  Left Ear: External ear normal.  Nose: Nose normal.  Mouth/Throat: Oropharynx is clear and moist. No oropharyngeal exudate.  Eyes: Conjunctivae and EOM are normal. Pupils are equal, round, and reactive to light.  Neck: Normal range of motion. Neck supple.  Cardiovascular: Normal rate, regular rhythm, normal heart sounds and intact distal pulses.  Exam reveals no gallop and no friction rub.   No murmur heard. Pulmonary/Chest: He is in respiratory distress (mild tachypnea). He has wheezes (diffuse mild wheezing).  Abdominal: Soft. Bowel sounds are normal. He exhibits no distension. There is no tenderness. There is no rebound and no guarding.  Musculoskeletal: Normal range of motion. He exhibits no edema and no tenderness.   Normal strength in bilateral upper extremities.  Normal sensation in bilateral upper extremities.  2+ distal pulses in upper extremities.   Neurological: He is alert and oriented to person, place, and time.  Skin: Skin is warm and dry.  Psychiatric: He has a normal mood and affect. His behavior is normal.    ED Course  Procedures (including critical care time) Labs Review Labs Reviewed  CBC - Abnormal; Notable for the following:    Hemoglobin 17.1 (*)    All other components within normal limits  BASIC METABOLIC PANEL - Abnormal; Notable for the following:    GFR calc non Af Amer 85 (*)    All other components within normal limits  PRO B NATRIURETIC PEPTIDE  POCT I-STAT  TROPONIN I   Imaging Review Dg Chest 2 View  01/19/2013   CLINICAL DATA:  Short of breath  EXAM: CHEST  2 VIEW  COMPARISON:  04/08/2012  FINDINGS: COPD with hyperinflation of the lungs. Negative for pneumonia. Negative for heart failure or effusion.  IMPRESSION: No active cardiopulmonary disease.   Electronically Signed   By: Marlan Palau M.D.   On: 01/19/2013 17:32    EKG Interpretation   None       MDM   1. COPD exacerbation   2. Right arm pain    8:40 PM 52 y.o. male with a history of COPD and chronic neck pain status post 2 cervical surgeries who presents with shortness of breath and cough for 4 days. He states that he also ran out of his Percocet and Valium 4 days ago and has had worsening right arm pain. He states that he has a burning pain in his right hand radiating up to his mid forearm as well as some paresthesias. He states that he has had this pain status post cervical neck surgery in October of 2014 but it has been controlled with pain medicine. He states that these are not new symptoms and have been controlled with the pain medicine. He is afebrile and vital signs are unremarkable here. He is mildly tachypneic on exam and has mild diffuse wheezing. He denies cp. His labs and chest x-ray are  noncontributory thus far. I suspect he has a COPD exacerbation. Will get pain control with IV Dilaudid. He states that he is also out of his albuterol inhaler at home.  11:08 PM: Pt feeling better, lungs now CTAB. Will provide a small amount of his percocet/valium as well was pred/alb.  I have discussed the diagnosis/risks/treatment options with the patient and believe the pt to be eligible for discharge home to follow-up with his pcp. We also discussed returning to the ED immediately if new or worsening sx occur. We discussed the sx which are most concerning (e.g., worsening pain, fever, cp, worsening sob) that necessitate immediate return. Any new prescriptions provided to the patient are listed below.  Discharge Medication List as of 01/19/2013 11:12 PM    START taking these medications   Details  !! albuterol (PROVENTIL HFA;VENTOLIN HFA) 108 (90 BASE) MCG/ACT inhaler Inhale 1-2 puffs into the lungs every 6 (six) hours as needed for wheezing or shortness of breath., Starting 01/19/2013, Until Discontinued, Print    !! diazepam (VALIUM) 5 MG tablet Take 1 tablet (5 mg total) by mouth every 6 (six) hours as needed for muscle spasms., Starting 01/19/2013, Until Discontinued, Print    !! oxyCODONE-acetaminophen (PERCOCET) 10-325 MG per tablet Take 1 tablet by mouth every 6 (six) hours as needed for pain., Starting 01/19/2013, Until Discontinued, Print    predniSONE (DELTASONE) 50 MG tablet Take 1 tablet by mouth daily for 4 days., Print     !! - Potential duplicate medications found. Please discuss with provider.       Junius Argyle, MD 01/20/13 1036

## 2013-01-31 ENCOUNTER — Other Ambulatory Visit: Payer: Self-pay | Admitting: Emergency Medicine

## 2013-02-11 ENCOUNTER — Ambulatory Visit (INDEPENDENT_AMBULATORY_CARE_PROVIDER_SITE_OTHER): Payer: Medicare Other | Admitting: Emergency Medicine

## 2013-02-11 ENCOUNTER — Encounter: Payer: Self-pay | Admitting: Emergency Medicine

## 2013-02-11 VITALS — BP 120/84 | HR 93 | Ht 68.0 in | Wt 213.8 lb

## 2013-02-11 DIAGNOSIS — J449 Chronic obstructive pulmonary disease, unspecified: Secondary | ICD-10-CM

## 2013-02-11 NOTE — Assessment & Plan Note (Signed)
He is fairly well controlled but runs out of ventolin before the end of the month, probably from over use. We discussed the proper schedule today as well as using the combivent qid every day. Many days he skips doses and this may lead to needing the albuterol later.   Please continue your Advair twice a day Start using your Combivent 4x a day every day Use your ventolin 2 puffs as needed in between your usual doses of Advair and Combivent Follow with Dr Delton Coombes in 3 months or sooner if you have any problems.

## 2013-02-11 NOTE — Patient Instructions (Signed)
Please continue your Advair twice a day Start using your Combivent 4x a day every day Use your ventolin 2 puffs as needed in between your usual doses of Advair and Combivent Follow with Dr Delton Coombes in 3 months or sooner if you have any problems.

## 2013-02-11 NOTE — Progress Notes (Signed)
Subjective:    Patient ID: Scott [name], male    DOB: 09/23/1960, 53 y.o.   MRN: [id number]  HPI 53 yo man, tobacco user (smoking 1/2 pk/day), [facility]. [facility] and [facility]. Now following [facility] Rec[facility] Dixonalbot [facility]. He has been having increased combivent use for SOB. Coughs every day, has been worse since anterior cervical fusion in November. Sputum is clear to yellow.  He is also on Advair.  He has bad GERD, better w omeprazole. Has allergy sx, on no meds.   ROV 07/11/11 -- COPD, still smoking 5 cig a day. Tells me that his breathing is the same to worse. Taking Advair bid, combivent ordered qid but he uses prn. Has nicotine patches but not using. Discussed cigarette cessation in detail today. Talked about setting a quit date Tells me that he benefited from the loratadine and fluticasone but stopped taking.   ROV 09/12/11 -- COPD, still smoking 2-3 cig a day. Returns for f/u. Has been on combivent prn + Advair bid. Using proventil, not as beneficial as the ventolin. Remains on loratadine + fluticasone. Also remains on omeprazole. No AE since last visit, although he did take some spare prednisone that he had last month. No abx. He is interested in [facility].   ROV 12/11/11 -- COPD. Returns for f/u. Has been on combivent prn + Advair bid. He had stopped smoking x 2 weeks but then restarted a few days ago, currently on 2-3 a day. He had spirometry as part of [facility] 12/05/11, FEV1 stable at 1.61L (43% pred), ratio 52%. Uses SABA 4-6 x a day. [facility]. No AE since last visit.   ROV 03/19/12 -- COPD. Returns for f/u. Has been on combivent prn + Advair bid. He was able to quit tobacco x 10 weeks! He had a slip-up over the last week, but is trying to quit again. He has a quit-coach through the [facility] quit line. He is having some drainage and cough for the last 2 days. No known URI  exposure. Uses combivent more freq than prescribed.  >>no changes   Acute OV 04/08/12 --  Complains of prod cough with green mucus, wheezing, increased SOB, cold sweats x10days, will finish abx and pred taper today Called in abx via telephone message on 3/10 CXR today shows no acute changes  Ran out of atrovent nebs. Only has albuterol nebs.  No smoking since last ov.  NO hemoptysis , chest pain , or edema.   ROV 06/21/12 -- COPD, allergies, GERD, cough. Was seen 3/17 for bronchitis, was treated with pred + abx. He improved. For the last 4-6 weeks has been dealing with more cough and mucous, has been out of loratadine and fluticasone for months. He is on combivent + Advair. He didn't have any albuterol so he started using combivent prn.   ROV 09/20/12 -- COPD, allergies, GERD, cough. Quit smoking 2 months. FEV1 1.61 (43%) in 2013. He presents today for pre-operative risk eval before a planned repeat cervical fusion by Dr Lovell SheehanJenkins.  He tells me that he has been breathing fairly well, although he developed some URI sx last 3-4 days. Has a cough with clear mucous. No wheezing. He is on Advair + combivent or ventolin prn. He uses ~3x a day.   ROV 02/11/13 -- COPD, allergies, GERD, cough. Returns for f/u. He  has been contacted by a company (he doesn't have the info w him today) regarding his breathing at night, ? Whether this was nocturnal oximetry. He is on advair + combivent prn, averages 3-4 uses daily. He has not gone back to smoking. He got through the cervical fusion well.  He was hospitalized at [facility] 3 weeks ago for an AE - now improved    Objective:   Physical Exam  Filed Vitals:   02/11/13 0911  BP: 120/84  Pulse: 93  Height: 5\' 8"  (1.727 m)  Weight: 213 lb 12.8 oz (96.979 kg)  SpO2: 94%   Gen: Pleasant, well-nourished, in no distress  ENT: No lesions,  mouth clear,  oropharynx clear, no postnasal drip,   Neck: No JVD, no TMG, no carotid bruits  Lungs: scattered rhonchi with no  wheezes, loud wheeze on a forced exp  Cardiovascular: RRR, heart sounds normal, no murmur or gallops, no peripheral edema  Musculoskeletal: No deformities, no cyanosis or clubbing  Neuro: alert, non focal  Skin: Warm, no lesions or rashes     Assessment & Plan:  COPD (chronic obstructive pulmonary disease) He is fairly well controlled but runs out of ventolin before the end of the month, probably from over use. We discussed the proper schedule today as well as using the combivent qid every day. Many days he skips doses and this may lead to needing the albuterol later.   Please continue your Advair twice a day Start using your Combivent 4x a day every day Use your ventolin 2 puffs as needed in between your usual doses of Advair and Combivent Follow with Dr Delton Coombes in 3 months or sooner if you have any problems.

## 2013-03-19 ENCOUNTER — Telehealth: Payer: Self-pay | Admitting: Emergency Medicine

## 2013-03-19 DIAGNOSIS — J309 Allergic rhinitis, unspecified: Secondary | ICD-10-CM

## 2013-03-19 DIAGNOSIS — J449 Chronic obstructive pulmonary disease, unspecified: Secondary | ICD-10-CM

## 2013-03-19 NOTE — Telephone Encounter (Signed)
LMTC x 1  

## 2013-03-21 NOTE — Telephone Encounter (Signed)
lmomtcb x2 at ext left

## 2013-03-24 MED ORDER — FLUTICASONE PROPIONATE 50 MCG/ACT NA SUSP: 2.0000 | Freq: Two times a day (BID) | NASAL | Status: DC

## 2013-03-24 MED ORDER — IPRATROPIUM-ALBUTEROL 20-100 MCG/ACT IN AERS: 1.0000 | INHALATION_SPRAY | Freq: Four times a day (QID) | RESPIRATORY_TRACT | Status: DC

## 2013-03-24 MED ORDER — FLUTICASONE-SALMETEROL 250-50 MCG/DOSE IN AEPB: 1.0000 | INHALATION_SPRAY | Freq: Two times a day (BID) | RESPIRATORY_TRACT | Status: DC

## 2013-03-24 MED ORDER — ALBUTEROL SULFATE HFA 108 (90 BASE) MCG/ACT IN AERS: 1.0000 | INHALATION_SPRAY | Freq: Four times a day (QID) | RESPIRATORY_TRACT | Status: DC | PRN

## 2013-03-24 NOTE — Telephone Encounter (Signed)
Called, spoke with pt.  He is requesting advair, ventolin, combivent qid (this was changed to scheduled at last OV in Jan 2015 with RB), and flonase rxs sent to [facility].  This has been done.  Pt aware.   Pt states [facility] provides him with the Duoneb.  Pt states he believes they are calling to see if RB wants to order a test to "test my breathing at night."  I have lmtcb for Zona to follow up on their call.

## 2013-03-24 NOTE — Telephone Encounter (Signed)
LMTCBx3 with ashley to have Zona call us back. Carron Curie, CMA

## 2013-03-24 NOTE — Telephone Encounter (Signed)
Patient is needing advair, ventolin, combivent, flonase.  [facility]

## 2013-03-26 NOTE — Telephone Encounter (Signed)
I spoke with Leta Jungling at [facility] home and he states when he spoke with the pt he was c/o having some SOB at night during the night and waking up with a headache. Leta Jungling wants to know if Dr. Delton Coombes would like to order an ONO for the pt? Please advise. Carron Curie, CMA

## 2013-03-26 NOTE — Telephone Encounter (Signed)
Yes please order an ONO on RA thanks

## 2013-03-27 NOTE — Telephone Encounter (Signed)
Order has been placed for ONO on RA. This order needs to be sent to [facility].

## 2013-04-03 ENCOUNTER — Telehealth: Payer: Self-pay

## 2013-04-03 NOTE — Telephone Encounter (Signed)
Patient called and stated the surgeon released him on 2/10 and wrote a Oxycodone RX to last one month. Patient went to see his PCP yesterday 3/11 and received another RX to last 7 days until his appt on 3/17 here. Patient wants to know if it is okay if he fills the RX that his PCP gave him yesterday until he is seen here on 3/17.

## 2013-04-03 NOTE — Telephone Encounter (Signed)
Contacted patient to inform him that per Dr. Wynn Banker it was okay to fill the RX that his PCP has given him until his appt on 3/17. Patient said it was for 40 pills.

## 2013-04-03 NOTE — Telephone Encounter (Signed)
Yes

## 2013-04-08 ENCOUNTER — Ambulatory Visit (HOSPITAL_BASED_OUTPATIENT_CLINIC_OR_DEPARTMENT_OTHER): Payer: Medicare Other | Admitting: Physical Medicine & Rehabilitation

## 2013-04-08 ENCOUNTER — Encounter: Payer: Medicare Other | Attending: Physical Medicine & Rehabilitation

## 2013-04-08 ENCOUNTER — Encounter: Payer: Self-pay | Admitting: Physical Medicine & Rehabilitation

## 2013-04-08 VITALS — BP 170/104 | HR 109 | Resp 16 | Ht 68.0 in | Wt 213.0 lb

## 2013-04-08 DIAGNOSIS — M545 Low back pain, unspecified: Secondary | ICD-10-CM | POA: Insufficient documentation

## 2013-04-08 DIAGNOSIS — J4489 Other specified chronic obstructive pulmonary disease: Secondary | ICD-10-CM | POA: Insufficient documentation

## 2013-04-08 DIAGNOSIS — M4712 Other spondylosis with myelopathy, cervical region: Secondary | ICD-10-CM

## 2013-04-08 DIAGNOSIS — M79609 Pain in unspecified limb: Secondary | ICD-10-CM | POA: Insufficient documentation

## 2013-04-08 DIAGNOSIS — M961 Postlaminectomy syndrome, not elsewhere classified: Secondary | ICD-10-CM

## 2013-04-08 DIAGNOSIS — R209 Unspecified disturbances of skin sensation: Secondary | ICD-10-CM | POA: Insufficient documentation

## 2013-04-08 DIAGNOSIS — E119 Type 2 diabetes mellitus without complications: Secondary | ICD-10-CM | POA: Insufficient documentation

## 2013-04-08 DIAGNOSIS — K219 Gastro-esophageal reflux disease without esophagitis: Secondary | ICD-10-CM | POA: Insufficient documentation

## 2013-04-08 DIAGNOSIS — M5137 Other intervertebral disc degeneration, lumbosacral region: Secondary | ICD-10-CM

## 2013-04-08 DIAGNOSIS — Z5181 Encounter for therapeutic drug level monitoring: Secondary | ICD-10-CM

## 2013-04-08 DIAGNOSIS — G8929 Other chronic pain: Secondary | ICD-10-CM | POA: Insufficient documentation

## 2013-04-08 DIAGNOSIS — Z79899 Other long term (current) drug therapy: Secondary | ICD-10-CM

## 2013-04-08 DIAGNOSIS — J449 Chronic obstructive pulmonary disease, unspecified: Secondary | ICD-10-CM | POA: Insufficient documentation

## 2013-04-08 DIAGNOSIS — Z87891 Personal history of nicotine dependence: Secondary | ICD-10-CM | POA: Insufficient documentation

## 2013-04-08 MED ORDER — DIAZEPAM 5 MG PO TABS: 5.0000 mg | ORAL_TABLET | Freq: Four times a day (QID) | ORAL | Status: DC | PRN

## 2013-04-08 MED ORDER — OXYCODONE-ACETAMINOPHEN 10-325 MG PO TABS: 2.0000 | ORAL_TABLET | Freq: Three times a day (TID) | ORAL | Status: DC | PRN

## 2013-04-08 NOTE — Patient Instructions (Signed)
Will need Urine drug screen today

## 2013-04-08 NOTE — Addendum Note (Signed)
Addended by: Judd Gaudier on: 04/08/2013 09:47 AM   Modules accepted: Orders

## 2013-04-08 NOTE — Progress Notes (Signed)
Subjective:    Patient ID: Scott [name], male    DOB: 05-Oct-1960, 53 y.o.   MRN: [id number] A 53 year old male who underwent a C3 through C6 ACDF 01/21/11 for cervical  stenosis and myelopathy  HPI Patient now seeing hand surgeon. Suspected to have carpal tunnel syndrome. Scheduled for EMG/MCV with physical medicine and rehabilitation  We reviewed previous EMG performed on 03/07/2011 on the right upper extremity which was normal. Patient finished with postoperative checks with neurosurgery for his most recent C6-C7 ACDF. Neurosurgery instructed him to get his chronic pain medications from this office. We discussed elevated blood pressure with recommendation to followup with primary care doctor Patient also scheduled to see neurosurgery on 08/05/2013 to discuss lumbar surgery  No falls  Pain Inventory Average Pain 10 Pain Right Now 10 My pain is sharp, burning, dull, stabbing and tingling  In the last 24 hours, has pain interfered with the following? General activity 10 Relation with others 10 Enjoyment of life 10 What TIME of day is your pain at its worst? constant Sleep (in general) Fair  Pain is worse with: walking, bending, sitting and standing Pain improves with: rest and medication Relief from Meds: 8  Mobility walk without assistance use a cane use a walker how many minutes can you walk? 5-7 ability to climb steps?  no do you drive?  no Do you have any goals in this area?  yes  Function disabled: date disabled 11/09 I need assistance with the following:  dressing, bathing, meal prep, household duties and shopping Do you have any goals in this area?  yes  Neuro/Psych weakness numbness tingling trouble walking dizziness depression loss of taste or smell  Prior Studies x-rays CT/MRI nerve study  Physicians involved in your care Johna Roles   Family History  Problem Relation Age of Onset  . Emphysema Mother   . COPD Mother   . Asthma Brother   .  Asthma Sister   . Heart disease Maternal Uncle    History   Social History  . Marital Status: Married    Spouse Name: N/A    Number of Children: N/A  . Years of Education: N/A   Occupational History  . DISABLED    Social History Main Topics  . Smoking status: Former Smoker -- 0.30 packs/day for 41 years    Types: Cigarettes    Start date: 03/17/2012    Quit date: 06/07/2012  . Smokeless tobacco: Never Used  . Alcohol Use: No  . Drug Use: No  . Sexual Activity: None   Other Topics Concern  . None   Social History Narrative  . None   Past Surgical History  Procedure Laterality Date  . Tonsillectomy    . Other surgical history      surgery on cheekbone and head for fall 2000  . 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 Multiple tooth extractions    . Anterior cervical decomp/discectomy fusion  12/22/2010    Procedure: ANTERIOR CERVICAL DECOMPRESSION/DISCECTOMY FUSION 3 LEVELS;  Surgeon: Cristi Loron;  Location: [facility];  Service: Neurosurgery;  Laterality: N/A;  Anterior cervical discectomy with fusion cervical three-four, four-five, and five sixCDF with Interbody Prosthesis, plating, and Bone Graft   . Anterior cervical decomp/discectomy fusion N/A 10/16/2012    Procedure: CERVICAL SIX-SEVEN ANTERIOR CERVICAL DECOMPRESSION/DISCECTOMY FUSION WITH INTERBODY PROTHESIS  PLATING BONEGRAFT WITH /POSSIBLE HARDWARE REMOVAL OLD PLATE;  Surgeon: Cristi LoronJeffrey D Jenkins, MD;  Location: [facility];  Service: Neurosurgery;  Laterality: N/A;   Past Medical History  Diagnosis Date  . COPD (chronic obstructive pulmonary disease)   . GERD (gastroesophageal reflux disease)   . Headache(784.0)   . 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 Diabetes mellitus without complication   . Pneumonia     hx   BP 170/104  Pulse 109  Resp 16  Ht 5\' 8"  (1.727 m)  Wt 213  lb (96.616 kg)  BMI 32.39 kg/m2  SpO2 94%  Opioid Risk Score:   Fall Risk Score: Moderate Fall Risk (6-13 points) (patient educated handout given)   Review of Systems  Constitutional: Positive for diaphoresis, appetite change and unexpected weight change.  Respiratory: Positive for cough, shortness of breath and wheezing.   Musculoskeletal: Positive for back pain, gait problem and neck pain.  Neurological: Positive for dizziness, weakness and numbness.       Tingling  All other systems reviewed and are negative.       Objective:   Physical Exam  Nursing note and vitals reviewed. Constitutional: He is oriented to person, place, and time. He appears well-developed and well-nourished.  Neurological: He is alert and oriented to person, place, and time. A sensory deficit is present.  Reflex Scores:      Tricep reflexes are 3+ on the right side and 3+ on the left side.      Bicep reflexes are 3+ on the right side and 3+ on the left side.      Brachioradialis reflexes are 3+ on the right side and 1+ on the left side.      Patellar reflexes are 3+ on the right side and 3+ on the left side.      Achilles reflexes are 3+ on the right side and 3+ on the left side. Decreased sensory median nerve dist Bilateral UE Also decreased right C6 sensation to pinprick 5/5 strength bilateral deltoid bicep triceps 4/5 bilateral grip 4/5 right wrist extension, 5/5 left wrist extension   Psychiatric: He has a normal mood and affect.   Limited lumbar range of motion. Stenotic gait. Decreased balance.      Assessment & Plan:  1. Cervical spinal stenosis with myelopathy. He has persistent hyperreflexia. Strength has improved. Right C6 deficit remains. Will recheck urine drug screen, continue his current pain medications. I would recommend over time reducing Valium  2. Hand numbness and pain. Seeing hand surgeon, may have carpal tunnel syndrome, EMG pending.  3. Chronic low back pain may also have  lumbar stenosis. Will get neurosurgery re\re evaluation in July  Over half of the 25 min visit was spent counseling and coordinating care.

## 2013-04-11 ENCOUNTER — Telehealth: Payer: Self-pay | Admitting: Emergency Medicine

## 2013-04-11 NOTE — Telephone Encounter (Signed)
i see the order, but I can't see that it has been done. Can you find out from [facility] whether they got the order and whether the ONO has been done yet? If the need order for an ONO then please give them one.

## 2013-04-11 NOTE — Telephone Encounter (Signed)
Called and spoke with [facility]. She will fax ONO to triage

## 2013-04-11 NOTE — Telephone Encounter (Signed)
Dr. Delton Coombes please advise if you have seen pt ONO yet? thanks

## 2013-04-14 NOTE — Telephone Encounter (Signed)
ONO received and placed in RB look-at. Carron Curie, CMA

## 2013-04-16 NOTE — Telephone Encounter (Signed)
Advised [name] at [facility] that RB had ONO and would review and place orders within the next day or so. She verbalized understanding and had no other questions or concerns. Nothing further needed at this time

## 2013-04-16 NOTE — Telephone Encounter (Signed)
[name] called in & states that pt is asking what the status is.  [name] can be reached at [phone].  Can we maybe give an update to the pt.  Scott [name]

## 2013-04-17 ENCOUNTER — Telehealth: Payer: Self-pay | Admitting: Emergency Medicine

## 2013-04-17 DIAGNOSIS — J449 Chronic obstructive pulmonary disease, unspecified: Secondary | ICD-10-CM

## 2013-04-17 NOTE — Telephone Encounter (Signed)
Attempted to call discuss ONO results and rec's LMTCB. Rec's O2 at night--2L, will place order once I have spoken to pt for DME for 2L/PM

## 2013-04-17 NOTE — Telephone Encounter (Signed)
Advised pt of ONO result per reading of Sood. Placed order for O2 at night, pt aware someone will call him at set him up with DME. Nothing further needed at this time

## 2013-04-21 ENCOUNTER — Other Ambulatory Visit: Payer: Self-pay

## 2013-04-21 NOTE — Progress Notes (Signed)
Urine drug screen results from 04/08/2013 were consistent.

## 2013-05-07 ENCOUNTER — Telehealth: Payer: Self-pay

## 2013-05-07 NOTE — Telephone Encounter (Signed)
Patient is requesting a refill on Diazepam and Oxycodone. His appt is on 4/23 and will be out of medication before then. Last office note states over time reducing the Valium.  Please advise.

## 2013-05-08 ENCOUNTER — Telehealth: Payer: Self-pay

## 2013-05-08 ENCOUNTER — Other Ambulatory Visit: Payer: Self-pay | Admitting: *Deleted

## 2013-05-08 MED ORDER — OXYCODONE-ACETAMINOPHEN 10-325 MG PO TABS: 2.0000 | ORAL_TABLET | Freq: Three times a day (TID) | ORAL | Status: DC | PRN

## 2013-05-08 NOTE — Telephone Encounter (Signed)
Patient changed his appt from 4/23 to 4/17 seeing Sybil. Gillis Ends is aware of Dr. Wynn Banker response to patient's request.

## 2013-05-08 NOTE — Telephone Encounter (Signed)
Do a bridge for valium and oxy and cont current dose until next visit

## 2013-05-08 NOTE — Telephone Encounter (Signed)
erro  neous encounter

## 2013-05-09 ENCOUNTER — Encounter: Payer: Self-pay | Admitting: *Deleted

## 2013-05-09 ENCOUNTER — Telehealth: Payer: Self-pay | Admitting: *Deleted

## 2013-05-09 ENCOUNTER — Encounter: Payer: Medicare Other | Attending: Physical Medicine & Rehabilitation | Admitting: *Deleted

## 2013-05-09 VITALS — BP 156/84 | HR 102 | Resp 14

## 2013-05-09 DIAGNOSIS — R209 Unspecified disturbances of skin sensation: Secondary | ICD-10-CM | POA: Insufficient documentation

## 2013-05-09 DIAGNOSIS — M4712 Other spondylosis with myelopathy, cervical region: Secondary | ICD-10-CM | POA: Insufficient documentation

## 2013-05-09 DIAGNOSIS — J449 Chronic obstructive pulmonary disease, unspecified: Secondary | ICD-10-CM | POA: Insufficient documentation

## 2013-05-09 DIAGNOSIS — M79609 Pain in unspecified limb: Secondary | ICD-10-CM | POA: Insufficient documentation

## 2013-05-09 DIAGNOSIS — M5137 Other intervertebral disc degeneration, lumbosacral region: Secondary | ICD-10-CM

## 2013-05-09 DIAGNOSIS — J4489 Other specified chronic obstructive pulmonary disease: Secondary | ICD-10-CM | POA: Insufficient documentation

## 2013-05-09 DIAGNOSIS — G8929 Other chronic pain: Secondary | ICD-10-CM | POA: Insufficient documentation

## 2013-05-09 DIAGNOSIS — M961 Postlaminectomy syndrome, not elsewhere classified: Secondary | ICD-10-CM

## 2013-05-09 DIAGNOSIS — E119 Type 2 diabetes mellitus without complications: Secondary | ICD-10-CM | POA: Insufficient documentation

## 2013-05-09 DIAGNOSIS — K219 Gastro-esophageal reflux disease without esophagitis: Secondary | ICD-10-CM | POA: Insufficient documentation

## 2013-05-09 DIAGNOSIS — M545 Low back pain, unspecified: Secondary | ICD-10-CM | POA: Insufficient documentation

## 2013-05-09 DIAGNOSIS — Z87891 Personal history of nicotine dependence: Secondary | ICD-10-CM | POA: Insufficient documentation

## 2013-05-09 NOTE — Telephone Encounter (Signed)
Please see my note (extensive) from my visit with Mr Scott [name] 05/09/13. I have placed and NCCSR on your cart and a copy of the police report Mr Nasca brought to our office. There is a question of receiving narcotics from Dr Dutch Quint after you resumed prescribing for him.

## 2013-05-09 NOTE — Patient Instructions (Signed)
Return in one month to see Dr Wynn Banker

## 2013-05-09 NOTE — Progress Notes (Signed)
Here for pill count and medication refills. Oxycodone 10/325 # 180 Fill date 04/08/13   Today #0  Pill count is appropriate.  Scott [name] started asking questions if you could become addicted to Valium. I told him most definitely benzodiazepines are as addictive as narcotics.  He says he fills like he may be addicted to the valium.  This opened the door for the conversation on how Dr Wynn Banker is expecting him to decrease his use of valium. He says he has been taking it 4 times per day and I suggested he try to go to 3 times per day until he is seen again. His last rx was given to him on 04/25/13 but it was for #60.  Scott [name] says he has been getting #120 but Dr Wynn Banker only gave him # 68 because he still had half his pills from Dr Lovell Sheehan.  Dr Wynn Banker last note was to bridge him to his appt he had scheduled 05/15/13 but that was when he though Scott [name] had an appt coming up 05/15/13.  That appt was cancelled and he was placed on my schedule for today because he would be out of percocet before then.  I told Scott [name] that I would need to sort some things out for his valium refill so I went through the chart to determine what he should be taking.  He had previously been prescribed #90 and #120 from our PA [facility] and then Dr Lovell Sheehan was giving him #120.  I pulled a NCCSR to see his last fills and he has been getting #120 from Dr Lovell Sheehan.  That is when I saw that he had been given #100 hydrocodone 10/325 from DR Dutch Quint on 04/19/13 after getting #180 percocet from DR Kirsteins 04/08/13. I know that he has had some surgery and Dr Wynn Banker is aware but I felt sure he was not aware or would approve of the hydrocodone given 10 days later by DR Dutch Quint.  Scott [name] first tried to say he didn't get it (didnt fill it) but the database showed he did fill it.  He then started telling me he couldn't take hydrocodone, but I told him the database showed he had been taking hydrocodone and had received #100 pills on both 2/11/5 and then  #100 03/07/13 by different physicians in the neurosurgery group. He then started telling me it was stolen, and had a police report for it.  I told him I would have to talk with Dr Wynn Banker about all of this but that taking the prescription from Dr Dutch Quint after Dr Wynn Banker resumed prescribing would be a violation of his contract.  I gave him his prescription for today for the percocet and #60 valium but informed him that depending on Dr Wynn Banker response, it could be his final prescriptions if it is determined that he has received from both prescribers.  Scott [name] left with the understanding that he would not know the outcome until Dr Wynn Banker returns to the office on Monday.  Scott [name] later brought back to the office a police report for hydrocodone on 03/06/13 which would explain the additional #100 hydrocodone given to him on 03/07/13. But it does not  Clear him from the #100 he was given 10 days after Dr Wynn Banker prescribed for him.

## 2013-05-12 NOTE — Telephone Encounter (Signed)
Left message with his wife for him to return our call.Discharge letter is being mailed with a copy of surrounding pain clinics.

## 2013-05-12 NOTE — Telephone Encounter (Signed)
As per our discussion will D/C secondary to obtaining opiates from multiple providers .  I gave him a months supply of percocet and a week later he filled hydrocodone Rx from another MD

## 2013-05-12 NOTE — Telephone Encounter (Signed)
Left voicemail on personally identified voicemail to return my call.  Scott [name] is being discharged from our clinic for receiving an rx for narcotics one week after he received a prescription from Dr Wynn Banker.

## 2013-05-13 NOTE — Telephone Encounter (Signed)
Patient returned call to clinic. Informed him per Dr. Wynn Banker he is discharged from the clinic for receiving an RX for narcotics 1 week after he received a RX from Dr. Wynn Banker. Informed patient that his discharge letter along with a copy of other clinic has been mailed to him. Patient states "we will be hearing from his attorney, he will be sueing"

## 2013-05-15 ENCOUNTER — Ambulatory Visit: Payer: Medicare Other | Admitting: Physical Medicine and Rehabilitation

## 2013-05-15 ENCOUNTER — Ambulatory Visit: Payer: Medicare Other | Admitting: Registered Nurse

## 2013-05-20 ENCOUNTER — Encounter: Payer: Self-pay | Admitting: Emergency Medicine

## 2013-05-22 ENCOUNTER — Encounter: Payer: Self-pay | Admitting: Family Medicine

## 2013-05-23 ENCOUNTER — Other Ambulatory Visit: Payer: Self-pay | Admitting: Emergency Medicine

## 2013-05-31 ENCOUNTER — Emergency Department (HOSPITAL_COMMUNITY): Payer: Medicare Other

## 2013-05-31 ENCOUNTER — Inpatient Hospital Stay (HOSPITAL_COMMUNITY)
Admission: EM | Admit: 2013-05-31 | Discharge: 2013-06-03 | DRG: 192 | Disposition: A | Discharge status: Home / Self Care | Payer: Medicare Other | Attending: Internal Medicine | Admitting: Internal Medicine

## 2013-05-31 ENCOUNTER — Encounter (HOSPITAL_COMMUNITY): Payer: Self-pay | Admitting: Emergency Medicine

## 2013-05-31 DIAGNOSIS — K219 Gastro-esophageal reflux disease without esophagitis: Secondary | ICD-10-CM

## 2013-05-31 DIAGNOSIS — E119 Type 2 diabetes mellitus without complications: Secondary | ICD-10-CM | POA: Diagnosis present

## 2013-05-31 DIAGNOSIS — Z981 Arthrodesis status: Secondary | ICD-10-CM | POA: Diagnosis not present

## 2013-05-31 DIAGNOSIS — M5137 Other intervertebral disc degeneration, lumbosacral region: Secondary | ICD-10-CM | POA: Diagnosis present

## 2013-05-31 DIAGNOSIS — J189 Pneumonia, unspecified organism: Secondary | ICD-10-CM

## 2013-05-31 DIAGNOSIS — R0902 Hypoxemia: Secondary | ICD-10-CM

## 2013-05-31 DIAGNOSIS — Z79899 Other long term (current) drug therapy: Secondary | ICD-10-CM | POA: Diagnosis not present

## 2013-05-31 DIAGNOSIS — Z87891 Personal history of nicotine dependence: Secondary | ICD-10-CM | POA: Diagnosis not present

## 2013-05-31 DIAGNOSIS — J440 Chronic obstructive pulmonary disease with acute lower respiratory infection: Secondary | ICD-10-CM | POA: Diagnosis not present

## 2013-05-31 DIAGNOSIS — M51379 Other intervertebral disc degeneration, lumbosacral region without mention of lumbar back pain or lower extremity pain: Secondary | ICD-10-CM | POA: Diagnosis present

## 2013-05-31 DIAGNOSIS — G4734 Idiopathic sleep related nonobstructive alveolar hypoventilation: Secondary | ICD-10-CM | POA: Diagnosis present

## 2013-05-31 DIAGNOSIS — R0602 Shortness of breath: Secondary | ICD-10-CM | POA: Diagnosis not present

## 2013-05-31 DIAGNOSIS — E785 Hyperlipidemia, unspecified: Secondary | ICD-10-CM | POA: Diagnosis present

## 2013-05-31 DIAGNOSIS — J209 Acute bronchitis, unspecified: Principal | ICD-10-CM | POA: Diagnosis present

## 2013-05-31 DIAGNOSIS — J441 Chronic obstructive pulmonary disease with (acute) exacerbation: Secondary | ICD-10-CM

## 2013-05-31 LAB — BASIC METABOLIC PANEL
BUN: 8 mg/dL (ref 6–23)
CHLORIDE: 104 meq/L (ref 96–112)
CO2: 23 meq/L (ref 19–32)
Calcium: 9 mg/dL (ref 8.4–10.5)
Creatinine, Ser: 1.18 mg/dL (ref 0.50–1.35)
GFR calc Af Amer: 80 mL/min — ABNORMAL LOW (ref >90)
GFR calc non Af Amer: 69 mL/min — ABNORMAL LOW (ref >90)
Glucose, Bld: 114 mg/dL — ABNORMAL HIGH (ref 70–99)
Potassium: 3.6 mEq/L — ABNORMAL LOW (ref 3.7–5.3)
Sodium: 143 mEq/L (ref 137–147)

## 2013-05-31 LAB — CBC WITH DIFFERENTIAL/PLATELET
BASOS ABS: 0 10*3/uL (ref 0.0–0.1)
Basophils Relative: 0 % (ref 0–1)
Eosinophils Absolute: 0.1 10*3/uL (ref 0.0–0.7)
Eosinophils Relative: 1 % (ref 0–5)
HCT: 45.4 % (ref 39.0–52.0)
Hemoglobin: 15.1 g/dL (ref 13.0–17.0)
Lymphocytes Relative: 25 % (ref 12–46)
Lymphs Abs: 2.3 10*3/uL (ref 0.7–4.0)
MCH: 30.1 pg (ref 26.0–34.0)
MCHC: 33.3 g/dL (ref 30.0–36.0)
MCV: 90.6 fL (ref 78.0–100.0)
Monocytes Absolute: 0.6 10*3/uL (ref 0.1–1.0)
Monocytes Relative: 6 % (ref 3–12)
Neutro Abs: 6.4 10*3/uL (ref 1.7–7.7)
Neutrophils Relative %: 68 % (ref 43–77)
Platelets: 223 10*3/uL (ref 150–400)
RBC: 5.01 MIL/uL (ref 4.22–5.81)
RDW: 13.6 % (ref 11.5–15.5)
WBC: 9.4 10*3/uL (ref 4.0–10.5)

## 2013-05-31 MED ORDER — IPRATROPIUM BROMIDE 0.02 % IN SOLN
0.5000 mg | Freq: Once | RESPIRATORY_TRACT | Status: AC
  Administered 2013-05-31: 0.5 mg via RESPIRATORY_TRACT
  Filled 2013-05-31: qty 2.5

## 2013-05-31 MED ORDER — ALBUTEROL (5 MG/ML) CONTINUOUS INHALATION SOLN: 2.5000 mg/h | INHALATION_SOLUTION | Freq: Once | RESPIRATORY_TRACT | Status: DC

## 2013-05-31 MED ORDER — LEVOFLOXACIN IN D5W 750 MG/150ML IV SOLN
750.0000 mg | Freq: Once | INTRAVENOUS | Status: AC
  Administered 2013-05-31: 750 mg via INTRAVENOUS
  Filled 2013-05-31: qty 150

## 2013-05-31 MED ORDER — ALBUTEROL (5 MG/ML) CONTINUOUS INHALATION SOLN
INHALATION_SOLUTION | RESPIRATORY_TRACT | Status: AC
  Administered 2013-05-31: 20 mg/h via RESPIRATORY_TRACT
  Filled 2013-05-31: qty 20

## 2013-05-31 MED ORDER — METHYLPREDNISOLONE SODIUM SUCC 125 MG IJ SOLR
125.0000 mg | Freq: Once | INTRAMUSCULAR | Status: AC
  Administered 2013-05-31: 125 mg via INTRAVENOUS
  Filled 2013-05-31: qty 2

## 2013-05-31 MED ORDER — ACETAMINOPHEN 500 MG PO TABS
1000.0000 mg | ORAL_TABLET | Freq: Once | ORAL | Status: AC
  Administered 2013-05-31: 1000 mg via ORAL
  Filled 2013-05-31: qty 2

## 2013-05-31 MED ORDER — ALBUTEROL (5 MG/ML) CONTINUOUS INHALATION SOLN
20.0000 mg/h | INHALATION_SOLUTION | Freq: Once | RESPIRATORY_TRACT | Status: AC
  Administered 2013-05-31: 20 mg/h via RESPIRATORY_TRACT

## 2013-05-31 NOTE — ED Provider Notes (Signed)
CSN: [id number]     Arrival date & time    History   First MD Initiated Contact with Patient 05/31/13 2025     Chief Complaint  Patient presents with  . Shortness of Breath     (Consider location/radiation/quality/duration/timing/severity/associated sxs/prior Treatment) Patient is a 53 y.o. male presenting with shortness of breath. The history is provided by the patient.  Shortness of Breath Severity:  Severe Onset quality:  Gradual Duration:  3 days Timing:  Constant Progression:  Worsening Chronicity:  Recurrent Context: activity   Relieved by:  Nothing Worsened by:  Nothing tried Ineffective treatments:  Oxygen and inhaler Associated symptoms: cough, diaphoresis, sputum production (green) and wheezing   Associated symptoms: no abdominal pain, no chest pain, no fever and no syncope   Risk factors: no hx of PE/DVT     Past Medical History  Diagnosis Date  . COPD (chronic obstructive pulmonary disease)   . GERD (gastroesophageal reflux disease)   . Headache(784.0)   . 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 Diabetes mellitus without complication   . Pneumonia     hx   Past Surgical History  Procedure Laterality Date  . Tonsillectomy    . Other surgical history      surgery on cheekbone and head for fall 2000  . 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 Multiple tooth extractions    . Anterior cervical decomp/discectomy fusion  12/22/2010    Procedure: ANTERIOR CERVICAL DECOMPRESSION/DISCECTOMY FUSION 3 LEVELS;  Surgeon: Cristi Loron;  Location: [facility];  Service: Neurosurgery;  Laterality: N/A;  Anterior cervical discectomy with fusion cervical three-four, four-five, and five sixCDF with Interbody Prosthesis, plating, and Bone Graft   . Anterior cervical decomp/discectomy fusion N/A 10/16/2012    Procedure: CERVICAL SIX-SEVEN ANTERIOR  CERVICAL DECOMPRESSION/DISCECTOMY FUSION WITH INTERBODY PROTHESIS PLATING BONEGRAFT WITH /POSSIBLE HARDWARE REMOVAL OLD PLATE;  Surgeon: Cristi Loron, MD;  Location: [facility];  Service: Neurosurgery;  Laterality: N/A;   Family History  Problem Relation Age of Onset  . Emphysema Mother   . COPD Mother   . Asthma Brother   . Asthma Sister   . Heart disease Maternal Uncle    History  Substance Use Topics  . Smoking status: Former Smoker -- 0.30 packs/day for 41 years    Types: Cigarettes    Start date: 03/17/2012    Quit date: 06/07/2012  . Smokeless tobacco: Never Used  . Alcohol Use: No    Review of Systems  Constitutional: Positive for diaphoresis. Negative for fever.  Respiratory: Positive for cough, sputum production (green), shortness of breath and wheezing.   Cardiovascular: Negative for chest pain and syncope.  Gastrointestinal: Negative for abdominal pain.  All other systems reviewed and are negative.     Allergies  Penicillins  Home Medications   Prior to Admission medications   Medication Sig Start Date End Date Taking? Authorizing Provider  albuterol (PROVENTIL HFA;VENTOLIN HFA) 108 (90 BASE) MCG/ACT inhaler Inhale 1-2 puffs into the lungs every 6 (six) hours as needed for wheezing or shortness of breath. 03/24/13   Leslye Peer, MD  diazepam (VALIUM) 5 MG tablet Take 1 tablet (5 mg total) by mouth every 6 (six) hours as needed for anxiety. 04/08/13   Erick Colace, MD  fluticasone (  FLONASE) 50 MCG/ACT nasal spray Place 2 sprays into both nostrils 2 (two) times daily. 03/24/13 03/24/14  Leslye Peerobert S Byrum, MD  Fluticasone-Salmeterol (ADVAIR) 250-50 MCG/DOSE AEPB Inhale 1 puff into the lungs every 12 (twelve) hours. 03/24/13   Leslye Peerobert S Byrum, MD  Ipratropium-Albuterol (COMBIVENT RESPIMAT) 20-100 MCG/ACT AERS respimat Inhale 1 puff into the lungs 4 (four) times daily. 03/24/13   Leslye Peerobert S Byrum, MD  ipratropium-albuterol (DUONEB) 0.5-2.5 (3) MG/3ML SOLN Take 3 mLs by  nebulization 4 (four) times daily as needed (shortness of breath). Dx 496 08/06/12   Leslye Peerobert S Byrum, MD  loratadine (CLARITIN) 10 MG tablet TAKE 1 TABLET BY MOUTH DAILY 05/23/13   Leslye Peerobert S Byrum, MD  omeprazole (PRILOSEC) 40 MG capsule Take 40 mg by mouth daily.    Historical Provider, MD  omeprazole (PRILOSEC) 40 MG capsule TAKE 1 CAPSULE BY MOUTH EVERY DAY 05/23/13   Leslye Peerobert S Byrum, MD  oxyCODONE-acetaminophen (PERCOCET) 10-325 MG per tablet Take 2 tablets by mouth every 8 (eight) hours as needed for pain. 05/08/13   Jacalyn LefevreEunice Thomas, NP  rosuvastatin (CRESTOR) 10 MG tablet Take 10 mg by mouth daily.    Historical Provider, MD  SitaGLIPtin-MetFORMIN HCl (JANUMET PO) Take 1 tablet by mouth daily.    Historical Provider, MD   BP 109/57  Pulse 121  Temp(Src) 97.7 F (36.5 C) (Oral)  Resp 28  SpO2 88% Physical Exam  Constitutional: He is oriented to person, place, and time. He appears well-developed and well-nourished. No distress.  HENT:  Head: Normocephalic and atraumatic.  Eyes: Conjunctivae are normal.  Neck: Neck supple. No tracheal deviation present.  Cardiovascular: Normal rate and regular rhythm.   Pulmonary/Chest: Accessory muscle usage present. No apnea. No respiratory distress. He has wheezes (coarse and diffuse). He exhibits tenderness (anteriorly).  Abdominal: Soft. He exhibits no distension.  Neurological: He is alert and oriented to person, place, and time. GCS eye subscore is 4. GCS verbal subscore is 5. GCS motor subscore is 6.  Skin: Skin is warm. No rash noted. He is diaphoretic.  Psychiatric: He has a normal mood and affect. His speech is normal.    ED Course  Procedures (including critical care time) Labs Review Labs Reviewed  BASIC METABOLIC PANEL - Abnormal; Notable for the following:    Potassium 3.6 (*)    Glucose, Bld 114 (*)    GFR calc non Af Amer 69 (*)    GFR calc Af Amer 80 (*)    All other components within normal limits  CBC WITH DIFFERENTIAL    Imaging  Review Dg Chest 2 View  05/31/2013   CLINICAL DATA:  Shortness of breath  EXAM: CHEST  2 VIEW  COMPARISON:  01/19/2013  FINDINGS: Pulmonary hyperinflation with diffuse interstitial coarsening. The interstitial prominence is mildly increased from prior. No consolidation, edema, effusion, or pneumothorax. Normal heart size. Multilevel cervical discectomy.  IMPRESSION: COPD. Interstitial coarsening has increased from priors, question bronchitis exacerbation.   Electronically Signed   By: Tiburcio PeaJonathan  Watts M.D.   On: 05/31/2013 22:38     EKG Interpretation   Date/Time:  Saturday May 31 2013 21:16:38 EDT Ventricular Rate:  116 PR Interval:  138 QRS Duration: 103 QT Interval:  335 QTC Calculation: 465 R Axis:   -103 Text Interpretation:  Sinus tachycardia Non-specific ST-t changes Lateral  leads are also involved Abnormal ekg since last tracing no significant  change Confirmed by MILLER  MD, BRIAN ([id number]) on 05/31/2013 11:14:34 PM      MDM  Final diagnoses:  COPD with acute exacerbation  Hypoxia    53 y.o. male presents with redness of breath that has been worsening over the last 3 days. He states that he has not had any fevers but he has had increased productive cough with yellow and green sputum. He has had an increase in frequency of his home breathing treatments, he states that he uses DuoNeb treatments 4 times daily and has had no recent antibiotics or steroids. He uses oxygen at night only at a rate of 2 L.  He presents with EMS moderately hypoxic and working to breathe, he received albuterol in route with mild improvement. He is speaking in full sentences and coughing up sputum intermittently. Respiratory therapy was at bedside and provided albuterol and ipratropium continuous nebulization. He was given 125 mg of Solu-Medrol IV and monitored. He required 3 L of nasal cannula oxygen at rest and was ambulated without oxygen and dropped into the mid-80s becoming acutely short of breath. Had  an episode of chest pain I believe is related to increased work of breathing and cough, EKG was performed and showed an early repole in inferior leads and no acute changes to suggest ischemic event, his pain is not consistent with a cardiac etiology. Due to increased oxygen requirement and evidence of moderate COPD exacerbation with hypoxia, will to internal medicine for monitoring overnight and continued steroid therapy as well as respiratory therapy for breathing treatments. Given a dose of Levaquin in the emergency department to cover for atypicals.    Lyndal Pulley, MD 06/01/13 [phone]

## 2013-05-31 NOTE — ED Notes (Signed)
Pt complaint of SOB and coughing for three days.  Pt given .5 of atrovent and 5 of albuterol by EMS, and pt took two breathing treatments at home prior to calling EMS.  Pt current sitting on bed talking in clear complete sentences.

## 2013-05-31 NOTE — ED Notes (Signed)
Pt c/o chest pressure "feels like elephant on chest"; EKG completed and MD notified of results.

## 2013-05-31 NOTE — ED Notes (Signed)
MD at bedside. 

## 2013-05-31 NOTE — ED Notes (Signed)
RN notified of pt abnormal vital signs

## 2013-05-31 NOTE — ED Provider Notes (Signed)
53 year old male, history of smoking, stopped 13 months ago, has COPD who presents after 4 days of gradual onset of persistent and now severe shortness of breath with wheezing. On exam the patient is wheezing in all lung fields, no peripheral edema, no JVD, mild tachycardia. He is only able to speak in shortened sentences even after continuous albuterol nebulizer treatments, steroids and a chest x-ray which shows no signs of acute infiltrate. The patient is persistently mildly hypoxic between 89-92% with supplemental oxygen and is unable to keep his oxygen saturations elevated when he ambulates. He will need admission to the hospital.  I saw and evaluated the patient, reviewed the resident's note and I agree with the findings and plan.   EKG Interpretation  Date/Time:  Saturday May 31 2013 21:16:38 EDT Ventricular Rate:  116 PR Interval:  138 QRS Duration: 103 QT Interval:  335 QTC Calculation: 465 R Axis:   -103 Text Interpretation:  Sinus tachycardia Non-specific ST-t changes Lateral leads are also involved Abnormal ekg since last tracing no significant change Confirmed by Hyacinth Meeker  MD, Murna Backer ([id number]) on 05/31/2013 11:14:34 PM        Scott Roller, MD 05/31/13 2318

## 2013-05-31 NOTE — ED Notes (Signed)
Nurse notified of pt oxygen level of 88%

## 2013-05-31 NOTE — ED Notes (Signed)
Pt ambulated in hallway with pulse ox; pt sats at 86% the entire time; pt placed back on O2 with at 3 L only increasing to 90-91%.

## 2013-05-31 NOTE — ED Notes (Signed)
Patient transported to X-ray 

## 2013-06-01 ENCOUNTER — Encounter (HOSPITAL_COMMUNITY): Payer: Self-pay | Admitting: Nurse Practitioner

## 2013-06-01 DIAGNOSIS — J441 Chronic obstructive pulmonary disease with (acute) exacerbation: Secondary | ICD-10-CM | POA: Diagnosis present

## 2013-06-01 DIAGNOSIS — J440 Chronic obstructive pulmonary disease with acute lower respiratory infection: Secondary | ICD-10-CM | POA: Diagnosis not present

## 2013-06-01 DIAGNOSIS — E119 Type 2 diabetes mellitus without complications: Secondary | ICD-10-CM

## 2013-06-01 DIAGNOSIS — R0902 Hypoxemia: Secondary | ICD-10-CM

## 2013-06-01 DIAGNOSIS — K219 Gastro-esophageal reflux disease without esophagitis: Secondary | ICD-10-CM | POA: Diagnosis present

## 2013-06-01 DIAGNOSIS — R0602 Shortness of breath: Secondary | ICD-10-CM | POA: Diagnosis not present

## 2013-06-01 DIAGNOSIS — J209 Acute bronchitis, unspecified: Secondary | ICD-10-CM | POA: Diagnosis present

## 2013-06-01 DIAGNOSIS — E785 Hyperlipidemia, unspecified: Secondary | ICD-10-CM

## 2013-06-01 DIAGNOSIS — G4734 Idiopathic sleep related nonobstructive alveolar hypoventilation: Secondary | ICD-10-CM | POA: Diagnosis present

## 2013-06-01 LAB — GLUCOSE, CAPILLARY
GLUCOSE-CAPILLARY: 195 mg/dL — AB (ref 70–99)
GLUCOSE-CAPILLARY: 153 mg/dL — AB (ref 70–99)
Glucose-Capillary: 140 mg/dL — ABNORMAL HIGH (ref 70–99)
Glucose-Capillary: 162 mg/dL — ABNORMAL HIGH (ref 70–99)

## 2013-06-01 LAB — CBC
HCT: 46.1 % (ref 39.0–52.0)
HEMOGLOBIN: 15 g/dL (ref 13.0–17.0)
MCH: 29.8 pg (ref 26.0–34.0)
MCHC: 32.5 g/dL (ref 30.0–36.0)
MCV: 91.5 fL (ref 78.0–100.0)
PLATELETS: 222 10*3/uL (ref 150–400)
RBC: 5.04 MIL/uL (ref 4.22–5.81)
RDW: 13.6 % (ref 11.5–15.5)
WBC: 8.4 10*3/uL (ref 4.0–10.5)

## 2013-06-01 LAB — BASIC METABOLIC PANEL
BUN: 13 mg/dL (ref 6–23)
CO2: 27 mEq/L (ref 19–32)
Calcium: 9.6 mg/dL (ref 8.4–10.5)
Chloride: 105 mEq/L (ref 96–112)
Creatinine, Ser: 1.06 mg/dL (ref 0.50–1.35)
GFR calc Af Amer: >90 mL/min (ref >90)
GFR calc non Af Amer: 79 mL/min — ABNORMAL LOW (ref >90)
GLUCOSE: 248 mg/dL — AB (ref 70–99)
POTASSIUM: 4 meq/L (ref 3.7–5.3)
SODIUM: 142 meq/L (ref 137–147)

## 2013-06-01 LAB — HEMOGLOBIN A1C
Hgb A1c MFr Bld: 6.5 % — ABNORMAL HIGH (ref <5.7)
Mean Plasma Glucose: 140 mg/dL — ABNORMAL HIGH (ref <117)

## 2013-06-01 MED ORDER — IPRATROPIUM-ALBUTEROL 0.5-2.5 (3) MG/3ML IN SOLN
3.0000 mL | RESPIRATORY_TRACT | Status: DC
  Administered 2013-06-01 (×4): 3 mL via RESPIRATORY_TRACT
  Filled 2013-06-01 (×5): qty 3

## 2013-06-01 MED ORDER — ONDANSETRON HCL 4 MG/2ML IJ SOLN: 4.0000 mg | Freq: Four times a day (QID) | INTRAMUSCULAR | Status: DC | PRN

## 2013-06-01 MED ORDER — PANTOPRAZOLE SODIUM 40 MG PO TBEC
40.0000 mg | DELAYED_RELEASE_TABLET | Freq: Every day | ORAL | Status: DC
  Administered 2013-06-01 – 2013-06-03 (×3): 40 mg via ORAL
  Filled 2013-06-01 (×3): qty 1

## 2013-06-01 MED ORDER — GUAIFENESIN ER 600 MG PO TB12: 1200.0000 mg | ORAL_TABLET | Freq: Two times a day (BID) | ORAL | Status: DC

## 2013-06-01 MED ORDER — GUAIFENESIN ER 600 MG PO TB12
1200.0000 mg | ORAL_TABLET | Freq: Two times a day (BID) | ORAL | Status: DC
  Administered 2013-06-01 – 2013-06-03 (×4): 1200 mg via ORAL
  Filled 2013-06-01 (×5): qty 2

## 2013-06-01 MED ORDER — DIAZEPAM 5 MG PO TABS
5.0000 mg | ORAL_TABLET | Freq: Four times a day (QID) | ORAL | Status: DC | PRN
  Administered 2013-06-01 – 2013-06-03 (×10): 5 mg via ORAL
  Filled 2013-06-01 (×10): qty 1

## 2013-06-01 MED ORDER — SITAGLIPTIN PHOS-METFORMIN HCL 50-500 MG PO TABS: 1.0000 | ORAL_TABLET | Freq: Every day | ORAL | Status: DC

## 2013-06-01 MED ORDER — LINAGLIPTIN 5 MG PO TABS
5.0000 mg | ORAL_TABLET | Freq: Every day | ORAL | Status: DC
  Administered 2013-06-01 – 2013-06-03 (×3): 5 mg via ORAL
  Filled 2013-06-01 (×4): qty 1

## 2013-06-01 MED ORDER — LORATADINE 10 MG PO TABS
10.0000 mg | ORAL_TABLET | Freq: Every day | ORAL | Status: DC
  Administered 2013-06-01 – 2013-06-03 (×3): 10 mg via ORAL
  Filled 2013-06-01 (×3): qty 1

## 2013-06-01 MED ORDER — INSULIN ASPART 100 UNIT/ML ~~LOC~~ SOLN
0.0000 [IU] | Freq: Every day | SUBCUTANEOUS | Status: DC
Start: 2013-06-01 — End: 2013-06-03

## 2013-06-01 MED ORDER — ATORVASTATIN CALCIUM 20 MG PO TABS
20.0000 mg | ORAL_TABLET | Freq: Every day | ORAL | Status: DC
Start: 2013-06-01 — End: 2013-06-03
  Administered 2013-06-01 – 2013-06-02 (×2): 20 mg via ORAL
  Filled 2013-06-01 (×3): qty 1

## 2013-06-01 MED ORDER — METHYLPREDNISOLONE SODIUM SUCC 125 MG IJ SOLR
125.0000 mg | Freq: Once | INTRAMUSCULAR | Status: AC
  Administered 2013-06-01: 125 mg via INTRAVENOUS
  Filled 2013-06-01: qty 2

## 2013-06-01 MED ORDER — ONDANSETRON HCL 4 MG PO TABS: 4.0000 mg | ORAL_TABLET | Freq: Four times a day (QID) | ORAL | Status: DC | PRN

## 2013-06-01 MED ORDER — HYDROMORPHONE HCL PF 1 MG/ML IJ SOLN: 0.5000 mg | INTRAMUSCULAR | Status: DC | PRN

## 2013-06-01 MED ORDER — IPRATROPIUM-ALBUTEROL 0.5-2.5 (3) MG/3ML IN SOLN: 3.0000 mL | RESPIRATORY_TRACT | Status: DC | PRN

## 2013-06-01 MED ORDER — ACETAMINOPHEN 325 MG PO TABS
650.0000 mg | ORAL_TABLET | Freq: Four times a day (QID) | ORAL | Status: DC | PRN
  Administered 2013-06-02 – 2013-06-03 (×5): 650 mg via ORAL
  Filled 2013-06-01 (×5): qty 2

## 2013-06-01 MED ORDER — ALUM & MAG HYDROXIDE-SIMETH 200-200-20 MG/5ML PO SUSP
30.0000 mL | Freq: Four times a day (QID) | ORAL | Status: DC | PRN
Start: 2013-06-01 — End: 2013-06-03

## 2013-06-01 MED ORDER — METFORMIN HCL ER 500 MG PO TB24
500.0000 mg | ORAL_TABLET | Freq: Every day | ORAL | Status: DC
Start: 2013-06-01 — End: 2013-06-03
  Administered 2013-06-01 – 2013-06-03 (×3): 500 mg via ORAL
  Filled 2013-06-01 (×4): qty 1

## 2013-06-01 MED ORDER — OXYCODONE HCL 5 MG PO TABS
5.0000 mg | ORAL_TABLET | ORAL | Status: DC | PRN
  Administered 2013-06-01 – 2013-06-03 (×13): 5 mg via ORAL
  Filled 2013-06-01 (×14): qty 1

## 2013-06-01 MED ORDER — INSULIN ASPART 100 UNIT/ML ~~LOC~~ SOLN
0.0000 [IU] | Freq: Three times a day (TID) | SUBCUTANEOUS | Status: DC
  Administered 2013-06-01: 2 [IU] via SUBCUTANEOUS
  Administered 2013-06-01: 1 [IU] via SUBCUTANEOUS
  Administered 2013-06-01: 3 [IU] via SUBCUTANEOUS
  Administered 2013-06-02: 1 [IU] via SUBCUTANEOUS
  Administered 2013-06-02 – 2013-06-03 (×4): 2 [IU] via SUBCUTANEOUS

## 2013-06-01 MED ORDER — ENOXAPARIN SODIUM 40 MG/0.4ML ~~LOC~~ SOLN
40.0000 mg | SUBCUTANEOUS | Status: DC
  Administered 2013-06-01 – 2013-06-03 (×3): 40 mg via SUBCUTANEOUS
  Filled 2013-06-01 (×3): qty 0.4

## 2013-06-01 MED ORDER — PREDNISONE 50 MG PO TABS
60.0000 mg | ORAL_TABLET | Freq: Every day | ORAL | Status: DC
  Administered 2013-06-01 – 2013-06-02 (×2): 60 mg via ORAL
  Filled 2013-06-01 (×3): qty 1

## 2013-06-01 MED ORDER — LEVOFLOXACIN IN D5W 750 MG/150ML IV SOLN
750.0000 mg | INTRAVENOUS | Status: DC
  Administered 2013-06-01 – 2013-06-02 (×2): 750 mg via INTRAVENOUS
  Filled 2013-06-01 (×3): qty 150

## 2013-06-01 MED ORDER — ACETAMINOPHEN 650 MG RE SUPP: 650.0000 mg | Freq: Four times a day (QID) | RECTAL | Status: DC | PRN

## 2013-06-01 MED ORDER — METHYLPREDNISOLONE SODIUM SUCC 125 MG IJ SOLR
80.0000 mg | Freq: Once | INTRAMUSCULAR | Status: DC
  Filled 2013-06-01: qty 1.28

## 2013-06-01 MED ORDER — FLUTICASONE PROPIONATE 50 MCG/ACT NA SUSP
2.0000 | Freq: Two times a day (BID) | NASAL | Status: DC
  Administered 2013-06-01 – 2013-06-03 (×5): 2 via NASAL
  Filled 2013-06-01: qty 16

## 2013-06-01 MED ORDER — SODIUM CHLORIDE 0.9 % IJ SOLN
3.0000 mL | Freq: Two times a day (BID) | INTRAMUSCULAR | Status: DC
  Administered 2013-06-01: 3 mL via INTRAVENOUS
  Administered 2013-06-01: 10:00:00 via INTRAVENOUS
  Administered 2013-06-01 – 2013-06-02 (×3): 3 mL via INTRAVENOUS

## 2013-06-01 MED ORDER — LEVOFLOXACIN 750 MG PO TABS
750.0000 mg | ORAL_TABLET | Freq: Every day | ORAL | Status: DC
  Filled 2013-06-01: qty 1

## 2013-06-01 MED ORDER — SODIUM CHLORIDE 0.9 % IV SOLN
INTRAVENOUS | Status: DC
  Administered 2013-06-01: 02:00:00 via INTRAVENOUS

## 2013-06-01 MED ORDER — IPRATROPIUM-ALBUTEROL 0.5-2.5 (3) MG/3ML IN SOLN
3.0000 mL | Freq: Four times a day (QID) | RESPIRATORY_TRACT | Status: DC
  Administered 2013-06-01 – 2013-06-02 (×3): 3 mL via RESPIRATORY_TRACT
  Filled 2013-06-01 (×2): qty 3

## 2013-06-01 NOTE — ED Provider Notes (Signed)
53 year old male, history of smoking, stopped 13 months ago, has COPD who presents after 4 days of gradual onset of persistent and now severe shortness of breath with wheezing. On exam the patient is wheezing in all lung fields, no peripheral edema, no JVD, mild tachycardia. He is only able to speak in shortened sentences even after continuous albuterol nebulizer treatments, steroids and a chest x-ray which shows no signs of acute infiltrate. The patient is persistently mildly hypoxic between 89-92% with supplemental oxygen and is unable to keep his oxygen saturations elevated when he ambulates. He will need admission to the hospital.   I saw and evaluated the patient, reviewed the resident's note and I agree with the findings and plan.      EKG Interpretation  Date/Time: Saturday May 31 2013 21:16:38 EDT  Ventricular Rate: 116  PR Interval: 138  QRS Duration: 103  QT Interval: 335  QTC Calculation: 465  R Axis: -103  Text Interpretation: Sinus tachycardia Non-specific ST-t changes Lateral leads are also involved Abnormal ekg since last tracing no significant change Confirmed by Hyacinth Meeker MD, Abel Ra ([id number]) on 05/31/2013 11:14:34 PM      Vida Roller, MD 06/01/13 1150

## 2013-06-01 NOTE — ED Notes (Signed)
MD at bedside. 

## 2013-06-01 NOTE — H&P (Signed)
[facility] History and Physical  Scott MountRaymond Pinkstaff AVW:[id number] DOB: 1960/11/30 DOA: 05/31/2013  Referring physician:  PCP: August SaucerEAN, ERIC, MD  Specialists:   Chief Complaint: SOB, Cough, and Wheezing  HPI: Scott [name] is a 53 y.o. male with a history of COPD, and DM2 who presents to the ED with complaints of worsening SOB and cough along with wheezing over the past 3-4 days.    He reports having fevers and chill and coughing up greenish sputum.   When he arrived in the ED, he was hypoxic and tachypneic,  And was placed on 3 liters of [place] O2 and his O2 saturation improved to 91 %.    He was admininstered continuous Nebulizer treatmetns  And had mild improvement. His Chest X-Ray was negative for pneumonia, and suggestive of bronchitis changes, and he was also placed on IV Levaquin due to the bronchitis.   +  Review of Systems:  Constitutional: No Weight Loss, No Weight Gain, Night Sweats, +Fevers, +Chills, Fatigue, or Generalized Weakness HEENT: No Headaches, Difficulty Swallowing,Tooth/Dental Problems,Sore Throat,  No Sneezing, Rhinitis, Ear Ache, Nasal Congestion, or Post Nasal Drip,  Cardio-vascular:  No Chest pain, Orthopnea, PND, Edema in lower extremities, Anasarca, Dizziness, Palpitations  Resp: +Dyspnea, No DOE, +Productive Cough, No Hemoptysis, +Wheezing.    GI: No Heartburn, Indigestion, Abdominal Pain, Nausea, Vomiting, Diarrhea, Change in Bowel Habits,  Loss of Appetite  GU: No Dysuria, Change in Color of Urine, No Urgency or Frequency.  No flank pain.  Musculoskeletal: No Joint Pain or Swelling.  No Decreased Range of Motion. No Back Pain.  Neurologic: No Syncope, No Seizures, Muscle Weakness, Paresthesia, Vision Disturbance or Loss, No Diplopia, No Vertigo, No Difficulty Walking,  Skin: No Rash or Lesions. Psych: No Change in Mood or Affect. No Depression or Anxiety. No Memory loss. No Confusion or Hallucinations   Past Medical History  Diagnosis Date  . COPD (chronic  obstructive pulmonary disease)   . GERD (gastroesophageal reflux disease)   . Headache(784.0)   . 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 Diabetes mellitus without complication   . Pneumonia     hx    Past Surgical History  Procedure Laterality Date  . Tonsillectomy    . Other surgical history      surgery on cheekbone and head for fall 2000  . 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 Multiple tooth extractions    . Anterior cervical decomp/discectomy fusion  12/22/2010    Procedure: ANTERIOR CERVICAL DECOMPRESSION/DISCECTOMY FUSION 3 LEVELS;  Surgeon: Cristi LoronJeffrey D Cashmere Harmes;  Location: [facility];  Service: Neurosurgery;  Laterality: N/A;  Anterior cervical discectomy with fusion cervical three-four, four-five, and five sixCDF with Interbody Prosthesis, plating, and Bone Graft   . Anterior cervical decomp/discectomy fusion N/A 10/16/2012    Procedure: CERVICAL SIX-SEVEN ANTERIOR CERVICAL DECOMPRESSION/DISCECTOMY FUSION WITH INTERBODY PROTHESIS PLATING BONEGRAFT WITH /POSSIBLE HARDWARE REMOVAL OLD PLATE;  Surgeon: Cristi LoronJeffrey D Tessy Pawelski, MD;  Location: [facility];  Service: Neurosurgery;  Laterality: N/A;       Prior to Admission medications   Medication Sig Start Date End Date Taking? Authorizing Provider  albuterol (PROVENTIL HFA;VENTOLIN HFA) 108 (90 BASE) MCG/ACT inhaler Inhale 1-2 puffs into the lungs every 6 (six) hours as needed for wheezing or shortness of breath. 03/24/13  Yes Les Pouobert S  Byrum, MD  diazepam (VALIUM) 5 MG tablet Take 1 tablet (5 mg total) by mouth every 6 (six) hours as needed for anxiety. 04/08/13  Yes Erick Colace, MD  fluticasone (FLONASE) 50 MCG/ACT nasal spray Place 2 sprays into both nostrils 2 (two) times daily. 03/24/13 03/24/14 Yes Leslye Peer, MD  Fluticasone-Salmeterol (ADVAIR) 250-50 MCG/DOSE AEPB Inhale 1 puff into the lungs every 12  (twelve) hours. 03/24/13  Yes Leslye Peer, MD  Ipratropium-Albuterol (COMBIVENT RESPIMAT) 20-100 MCG/ACT AERS respimat Inhale 1 puff into the lungs 4 (four) times daily. 03/24/13  Yes Leslye Peer, MD  ipratropium-albuterol (DUONEB) 0.5-2.5 (3) MG/3ML SOLN Take 3 mLs by nebulization 4 (four) times daily as needed (shortness of breath). Dx 496 08/06/12  Yes Leslye Peer, MD  loratadine (CLARITIN) 10 MG tablet Take 10 mg by mouth daily.   Yes Historical Provider, MD  omeprazole (PRILOSEC) 40 MG capsule Take 40 mg by mouth daily.   Yes Historical Provider, MD  oxyCODONE-acetaminophen (PERCOCET) 10-325 MG per tablet Take 2 tablets by mouth every 8 (eight) hours as needed for pain. 05/08/13  Yes Jacalyn Lefevre, NP  rosuvastatin (CRESTOR) 10 MG tablet Take 10 mg by mouth daily.   Yes Historical Provider, MD  sitaGLIPtin-metformin (JANUMET) 50-500 MG per tablet Take 1 tablet by mouth daily.   Yes Historical Provider, MD      Allergies  Allergen Reactions  . Penicillins Anaphylaxis     Social History:  reports that he quit smoking about a year ago. His smoking use included Cigarettes. He started smoking about 14 months ago. He has a 12.3 pack-year smoking history. He has never used smokeless tobacco. He reports that he does not drink alcohol or use illicit drugs.     Family History  Problem Relation Age of Onset  . Emphysema Mother   . COPD Mother   . Asthma Brother   . Asthma Sister   . Heart disease Maternal Uncle   . Hypertension Brother        Physical Exam:  GEN:  Pleasant Well Nourished and Well Developed 53 y.o. male examined and in no acute distress; cooperative with exam Filed Vitals:   05/31/13 2330 06/01/13 0030 06/01/13 0100 06/01/13 0157  BP: 118/62 135/70 116/67 117/64  Pulse: 117 115 113 109  Temp:    98.6 F (37 C)  TempSrc:    Oral  Resp: 20 25 31 21   Weight:    91.1 kg (200 lb 13.4 oz)  SpO2: 91% 92% 91% 93%   Blood pressure 117/64, pulse 109, temperature 98.6  F (37 C), temperature source Oral, resp. rate 21, weight 91.1 kg (200 lb 13.4 oz), SpO2 93.00%. PSYCH: He is alert and oriented x4; does not appear anxious does not appear depressed; affect is normal HEENT: Normocephalic and Atraumatic, Mucous membranes pink; PERRLA; EOM intact; Fundi:  Benign;  No scleral icterus, Nares: Patent, Oropharynx: Clear, Poor  Dentition, Neck:  FROM, no cervical lymphadenopathy nor thyromegaly or carotid bruit; no JVD; Breasts:: Not examined CHEST WALL: No tenderness CHEST: Coarse and Decreased Breath Sounds,  + Rhonchi throughout , and  Diffuse Expiratory Wheezes HEART: Tachycardic  Rate, and Regular rhythm; no murmurs rubs or gallops BACK: No kyphosis or scoliosis; no CVA tenderness ABDOMEN: Positive Bowel Sounds, soft non-tender; no masses, no organomegaly. Rectal Exam: Not done EXTREMITIES: No cyanosis, clubbing or edema; no ulcerations. Genitalia: not examined PULSES: 2+ and symmetric SKIN: Normal hydration no rash or ulceration CNS:  Alert and  Oriented x 4,  No Focal Deficits.    Vascular: pulses palpable throughout    Labs on Admission:  Basic Metabolic Panel:  Recent Labs Lab 05/31/13 2045  NA 143  K 3.6*  CL 104  CO2 23  GLUCOSE 114*  BUN 8  CREATININE 1.18  CALCIUM 9.0   Liver Function Tests: No results found for this basename: AST, ALT, ALKPHOS, BILITOT, PROT, ALBUMIN,  in the last 168 hours No results found for this basename: LIPASE, AMYLASE,  in the last 168 hours No results found for this basename: AMMONIA,  in the last 168 hours CBC:  Recent Labs Lab 05/31/13 2045  WBC 9.4  NEUTROABS 6.4  HGB 15.1  HCT 45.4  MCV 90.6  PLT 223   Cardiac Enzymes: No results found for this basename: CKTOTAL, CKMB, CKMBINDEX, TROPONINI,  in the last 168 hours  BNP (last 3 results)  Recent Labs  01/19/13 1654  PROBNP 45.2   CBG: No results found for this basename: GLUCAP,  in the last 168 hours  Radiological Exams on Admission: Dg  Chest 2 View  05/31/2013   CLINICAL DATA:  Shortness of breath  EXAM: CHEST  2 VIEW  COMPARISON:  01/19/2013  FINDINGS: Pulmonary hyperinflation with diffuse interstitial coarsening. The interstitial prominence is mildly increased from prior. No consolidation, edema, effusion, or pneumothorax. Normal heart size. Multilevel cervical discectomy.  IMPRESSION: COPD. Interstitial coarsening has increased from priors, question bronchitis exacerbation.   Electronically Signed   By: Tiburcio Pea M.D.   On: 05/31/2013 22:38    EKG: Independently reviewed.  Sinus Tachycardia at 116.  No acute changes.     Assessment/Plan:   53 y.o. male with  Principal Problem:   COPD exacerbation Active Problems:   Hypoxia   Degeneration of lumbar or lumbosacral intervertebral disc   Diabetes mellitus without complication   GERD (gastroesophageal reflux disease)    Acute Bronchitis   1.  COPD Exacerbation-   Telemetry Monitory,  IV Steroid Taper, and DUONEBs q 4 hours and placed on IV Levaquin Rx.     2.  Acute Bronchitis/URI-  IV Levaquin overnight then PO Levaquin to continue daily.     3.  Hypoxia- due to #1 and #2,  [place] O2 PRN, and Monitor O2 Sats.     4.  DDD of lumbar spin and Cervical Spine-  Continue Home Pain Regimen.    5.  DM2-   Continue Janumet Rx, and Add SSI coverage PRN,  Check HbA1c in AM.    6.  GERD- Continue PPI rx,  Protonix while inpatient.    7.  DVT prophylaxis with Lovenox.       Code Status:     FULL CODE Family Communication:   No Family Present Disposition Plan:     Inpatient  Time spent:  28 Minutes  [place] Denner Velora Heckler [facility] Pager [phone]  If 7PM-7AM, please contact night-coverage [email or web address] Password [email or web address] 06/01/2013, 3:24 AM

## 2013-06-01 NOTE — Progress Notes (Signed)
[facility] PROGRESS NOTE  Scott [name] [id number] DOB: 17-Nov-1960 DOA: 05/31/2013 PCP: Scott Blade, MD  Assessment/Plan: 1. Chronic obstructive pulmonary disease exacerbation -Patient administered IV steroids overnight, reports feeling better this morning. -Will continue steroids with prednisone 60 mg by mouth daily, duo nebs, empiric antibiotic therapy with Levaquin -I. suspect underlying infectious process precipitating COPD exacerbation  2.  Suspected community acquired pneumonia versus bacterial bronchitis -Underlying infectious process may have precipitated COPD exacerbation -He is on emperic antibiotic therapy with Levaquin  3. Type 2 diabetes mellitus -Blood sugars mostly in the mid 100 range -Continue metformin, Accu-Cheks Q. a.c. and each bedtime with sliding scale coverage  4. Dyslipidemia -Continue statin therapy  Code Status: Full Code Family Communication:  Disposition Plan: Continue supportive care, steroids, nebs, AB's  Antibiotics:  Levaquin  HPI/Subjective: Patient is a pleasant 53 year old gentleman with a past medical history of chronic obstructive pulmonary disease, admitted to the medicine service on 06/01/2013 as he presented with worsening cough, wheezing and shortness of breath. Symptoms attributed to COPD exacerbation as he was given IV steroids and placed on nebulizers. Chest x-ray on admission showed findings suggestive of COPD.  Objective: Filed Vitals:   06/01/13 1500  BP: 147/83  Pulse: 94  Temp: 97.2 F (36.2 C)  Resp: 20    Intake/Output Summary (Last 24 hours) at 06/01/13 1551 Last data filed at 06/01/13 1412  Gross per 24 hour  Intake 1326.33 ml  Output    800 ml  Net 526.33 ml   Filed Weights   06/01/13 0157  Weight: 91.1 kg (200 lb 13.4 oz)    Exam:   General:  Awake alert and oriented x3, in no acute distress  Cardiovascular: Regular rate rhythm normal S1-S2  Respiratory: Diminished breath sounds bilaterally,  bilateral expiratory wheezes present  Abdomen: Soft nontender nondistended  Musculoskeletal: No edema  Data Reviewed: Basic Metabolic Panel:  Recent Labs Lab 05/31/13 2045 06/01/13 0435  NA 143 142  K 3.6* 4.0  CL 104 105  CO2 23 27  GLUCOSE 114* 248*  BUN 8 13  CREATININE 1.18 1.06  CALCIUM 9.0 9.6   Liver Function Tests: No results found for this basename: AST, ALT, ALKPHOS, BILITOT, PROT, ALBUMIN,  in the last 168 hours No results found for this basename: LIPASE, AMYLASE,  in the last 168 hours No results found for this basename: AMMONIA,  in the last 168 hours CBC:  Recent Labs Lab 05/31/13 2045 06/01/13 0435  WBC 9.4 8.4  NEUTROABS 6.4  --   HGB 15.1 15.0  HCT 45.4 46.1  MCV 90.6 91.5  PLT 223 222   Cardiac Enzymes: No results found for this basename: CKTOTAL, CKMB, CKMBINDEX, TROPONINI,  in the last 168 hours BNP (last 3 results)  Recent Labs  01/19/13 1654  PROBNP 45.2   CBG:  Recent Labs Lab 06/01/13 0640 06/01/13 1147  GLUCAP 195* 153*    No results found for this or any previous visit (from the past 240 hour(s)).   Studies: Dg Chest 2 View  05/31/2013   CLINICAL DATA:  Shortness of breath  EXAM: CHEST  2 VIEW  COMPARISON:  01/19/2013  FINDINGS: Pulmonary hyperinflation with diffuse interstitial coarsening. The interstitial prominence is mildly increased from prior. No consolidation, edema, effusion, or pneumothorax. Normal heart size. Multilevel cervical discectomy.  IMPRESSION: COPD. Interstitial coarsening has increased from priors, question bronchitis exacerbation.   Electronically Signed   By: Scott [name] M.D.   On: 05/31/2013 22:38    Scheduled  Meds: . atorvastatin  20 mg Oral q1800  . enoxaparin (LOVENOX) injection  40 mg Subcutaneous Q24H  . fluticasone  2 spray Each Nare BID  . insulin aspart  0-5 Units Subcutaneous QHS  . insulin aspart  0-9 Units Subcutaneous TID WC  . ipratropium-albuterol  3 mL Nebulization Q4H  .  levofloxacin  750 mg Oral Daily  . linagliptin  5 mg Oral Daily  . loratadine  10 mg Oral Daily  . metFORMIN  500 mg Oral Q breakfast  . pantoprazole  40 mg Oral Daily  . predniSONE  60 mg Oral Q breakfast  . sodium chloride  3 mL Intravenous Q12H   Continuous Infusions: . sodium chloride 100 mL/hr at 06/01/13 0210    Principal Problem:   COPD exacerbation Active Problems:   Degeneration of lumbar or lumbosacral intervertebral disc   Hypoxia   Diabetes mellitus without complication   GERD (gastroesophageal reflux disease)   Acute bronchitis    Time spent: 35 min   Scott [name]  [facility] Pager [phone]. If 7PM-7AM, please contact night-coverage at [email or web address], password [email or web address] 06/01/2013, 3:51 PM  LOS: 1 day

## 2013-06-02 ENCOUNTER — Inpatient Hospital Stay (HOSPITAL_COMMUNITY): Payer: Medicare Other

## 2013-06-02 LAB — GLUCOSE, CAPILLARY
GLUCOSE-CAPILLARY: 152 mg/dL — AB (ref 70–99)
GLUCOSE-CAPILLARY: 154 mg/dL — AB (ref 70–99)
Glucose-Capillary: 131 mg/dL — ABNORMAL HIGH (ref 70–99)
Glucose-Capillary: 156 mg/dL — ABNORMAL HIGH (ref 70–99)

## 2013-06-02 MED ORDER — MOMETASONE FURO-FORMOTEROL FUM 100-5 MCG/ACT IN AERO
2.0000 | INHALATION_SPRAY | Freq: Two times a day (BID) | RESPIRATORY_TRACT | Status: DC
  Administered 2013-06-02 – 2013-06-03 (×2): 2 via RESPIRATORY_TRACT
  Filled 2013-06-02: qty 8.8

## 2013-06-02 MED ORDER — METHYLPREDNISOLONE SODIUM SUCC 125 MG IJ SOLR
60.0000 mg | Freq: Four times a day (QID) | INTRAMUSCULAR | Status: DC
  Administered 2013-06-02 – 2013-06-03 (×4): 60 mg via INTRAVENOUS
  Filled 2013-06-02 (×8): qty 0.96
  Filled 2013-06-02: qty 2

## 2013-06-02 MED ORDER — IPRATROPIUM-ALBUTEROL 0.5-2.5 (3) MG/3ML IN SOLN
3.0000 mL | RESPIRATORY_TRACT | Status: DC
  Administered 2013-06-02 – 2013-06-03 (×3): 3 mL via RESPIRATORY_TRACT
  Filled 2013-06-02 (×3): qty 3

## 2013-06-02 NOTE — Care Management Note (Signed)
    Page 1 of 1   06/02/2013     11:43:22 AM CARE MANAGEMENT NOTE 06/02/2013  Patient:  Scott [name], Scott [name]   Account Number:  [id number]  Date Initiated:  06/02/2013  Documentation initiated by:  Oletta Cohn  Subjective/Objective Assessment:   53 y.o. male with a history of COPD, and DM2  presents to the ED with complaints of worsening SOB and cough along with wheezing over the past 3-4 days.  He reports having fevers and chill and coughing up greenish sputum. //Home with spouse.     Action/Plan:   Telemetry Monitory,  IV Steroid Taper, and DUONEBs q 4 hours and placed on IV Levaquin Rx.  //Access for [facility] services.   Anticipated DC Date:  06/02/2013   Anticipated DC Plan:  HOME/SELF CARE      DC Planning Services  CM consult  Patient refused services      Choice offered to / List presented to:  C-1 Patient        HH arranged  HH - 11 Patient Refused      Status of service:  Completed, signed off Medicare Important Message given?   (If response is "NO", the following Medicare IM given date fields will be blank) Date Medicare IM given:   Date Additional Medicare IM given:    Discharge Disposition:    Per UR Regulation:  Reviewed for med. necessity/level of care/duration of stay  If discussed at Long Length of Stay Meetings, dates discussed:    Comments:  06/02/13 1115 Oletta Cohn, RN, BSN, [employer] [phone] Spoke to pt and wife at bedside.  I have recommended that this patient have Home Health Services but he declines at this time. I have discussed the benefits of Home Health with him. The patient verbalizes understanding.  States that his wife and grand daughters take very good care of him.

## 2013-06-02 NOTE — Progress Notes (Signed)
[facility] PROGRESS NOTE  Perry MountRaymond Marcil XLK:[id number] DOB: April 09, 1960 DOA: 05/31/2013 PCP: Willey BladeEAN, ERIC, MD  Assessment/Plan: 1. Chronic obstructive pulmonary disease exacerbation -Patient reports doing poorly on today's evaluation. We ambulated down the hallways, had significant dyspnea on exertion. Lung exam reveals bilateral expiratory wheezing.  -Will Place him on SoluMedrol 60 mg IV q 6 hours -Continue scheduled Duonebs, emperic antibiotic, inhaled steroid.   2.  Suspected community acquired pneumonia versus bacterial bronchitis - Repeat CXR showing streaky opacity at the left lung base -Clinically has signs and symptoms of PNA -Will continue Levaquin 750 mg IV q 24 hours, repeat CXR in AM   3. Type 2 diabetes mellitus -Blood sugars controlled -Continue metformin, Accu-Cheks Q. a.c. and each bedtime with sliding scale coverage  4. Dyslipidemia -Continue statin therapy  Code Status: Full Code Family Communication:  Disposition Plan: Continue supportive care, steroids, nebs, AB's  Antibiotics:  Levaquin  HPI/Subjective: Patient is a pleasant 53 year old gentleman with a past medical history of chronic obstructive pulmonary disease, admitted to the medicine service on 06/01/2013 as he presented with worsening cough, wheezing and shortness of breath. Symptoms attributed to COPD exacerbation as he was given IV steroids and placed on nebulizers. Chest x-ray on admission showed findings suggestive of COPD.  Patient reporting doing poorly today, feeling a little worse from yesterday. CXR showing opacity. He had dyspnea of exertion with ambulation. Has ongoing thick yellow-green sputum production with cough.    Objective: Filed Vitals:   06/02/13 1334  BP: 132/77  Pulse: 95  Temp: 97.9 F (36.6 C)  Resp: 20    Intake/Output Summary (Last 24 hours) at 06/02/13 1336 Last data filed at 06/02/13 1302  Gross per 24 hour  Intake   1563 ml  Output   1625 ml  Net    -62 ml    Filed Weights   06/01/13 0157 06/02/13 0710  Weight: 91.1 kg (200 lb 13.4 oz) 94.7 kg (208 lb 12.4 oz)    Exam:   General:  Ill appearing   Cardiovascular: Regular rate rhythm normal S1-S2  Respiratory: Diminished breath sounds bilaterally, increased bilateral expiratory wheezes.   Abdomen: Soft nontender nondistended  Musculoskeletal: No edema  Data Reviewed: Basic Metabolic Panel:  Recent Labs Lab 05/31/13 2045 06/01/13 0435  NA 143 142  K 3.6* 4.0  CL 104 105  CO2 23 27  GLUCOSE 114* 248*  BUN 8 13  CREATININE 1.18 1.06  CALCIUM 9.0 9.6   Liver Function Tests: No results found for this basename: AST, ALT, ALKPHOS, BILITOT, PROT, ALBUMIN,  in the last 168 hours No results found for this basename: LIPASE, AMYLASE,  in the last 168 hours No results found for this basename: AMMONIA,  in the last 168 hours CBC:  Recent Labs Lab 05/31/13 2045 06/01/13 0435  WBC 9.4 8.4  NEUTROABS 6.4  --   HGB 15.1 15.0  HCT 45.4 46.1  MCV 90.6 91.5  PLT 223 222   Cardiac Enzymes: No results found for this basename: CKTOTAL, CKMB, CKMBINDEX, TROPONINI,  in the last 168 hours BNP (last 3 results)  Recent Labs  01/19/13 1654  PROBNP 45.2   CBG:  Recent Labs Lab 06/01/13 1147 06/01/13 1620 06/01/13 2112 06/02/13 0631 06/02/13 1117  GLUCAP 153* 140* 162* 152* 154*    No results found for this or any previous visit (from the past 240 hour(s)).   Studies: Dg Chest 2 View  06/02/2013   CLINICAL DATA:  Shortness breath, cough, COPD  EXAM:  CHEST  2 VIEW  COMPARISON:  Chest x-ray of 05/31/2013, and 01/19/2013  FINDINGS: There is streaky opacity at the left lung base. This may represent atelectasis, but developing pneumonia cannot be excluded. No pleural effusion is seen. The lungs remain hyperaerated. Mediastinal and hilar contours are stable. Heart size is stable. A lower anterior cervical spine fusion plate is noted.  IMPRESSION: Streaky opacity at the left lung  base consistent with atelectasis. Recommend followup to exclude developing pneumonia.   Electronically Signed   By: Dwyane Dee M.D.   On: 06/02/2013 07:31   Dg Chest 2 View  05/31/2013   CLINICAL DATA:  Shortness of breath  EXAM: CHEST  2 VIEW  COMPARISON:  01/19/2013  FINDINGS: Pulmonary hyperinflation with diffuse interstitial coarsening. The interstitial prominence is mildly increased from prior. No consolidation, edema, effusion, or pneumothorax. Normal heart size. Multilevel cervical discectomy.  IMPRESSION: COPD. Interstitial coarsening has increased from priors, question bronchitis exacerbation.   Electronically Signed   By: Tiburcio Pea M.D.   On: 05/31/2013 22:38    Scheduled Meds: . atorvastatin  20 mg Oral q1800  . enoxaparin (LOVENOX) injection  40 mg Subcutaneous Q24H  . fluticasone  2 spray Each Nare BID  . guaiFENesin  1,200 mg Oral BID  . insulin aspart  0-5 Units Subcutaneous QHS  . insulin aspart  0-9 Units Subcutaneous TID WC  . ipratropium-albuterol  3 mL Nebulization Q4H  . levofloxacin (LEVAQUIN) IV  750 mg Intravenous Q24H  . linagliptin  5 mg Oral Daily  . loratadine  10 mg Oral Daily  . metFORMIN  500 mg Oral Q breakfast  . methylPREDNISolone (SOLU-MEDROL) injection  60 mg Intravenous Q6H  . mometasone-formoterol  2 puff Inhalation BID  . pantoprazole  40 mg Oral Daily  . sodium chloride  3 mL Intravenous Q12H   Continuous Infusions:    Principal Problem:   COPD exacerbation Active Problems:   Degeneration of lumbar or lumbosacral intervertebral disc   Hypoxia   Diabetes mellitus without complication   GERD (gastroesophageal reflux disease)   Acute bronchitis    Time spent: 35 min   Jeralyn Bennett  [facility] Pager [phone]. If 7PM-7AM, please contact night-coverage at [email or web address], password [facility] 06/02/2013, 1:36 PM  LOS: 2 days

## 2013-06-02 NOTE — Plan of Care (Signed)
Problem: Phase II Progression Outcomes Goal: Wean O2 if indicated Outcome: Completed/Met Date Met:  06/02/13 Only wears 2 liters of oxygen at night time. Goal: Pain controlled Outcome: Progressing Patient has pain level of 10 at every assessment of pain. Pain medication given every 6 hours as needed per patient request

## 2013-06-02 NOTE — Progress Notes (Signed)
Patient alarmed run of bigeminy.  Patient asymptomatic.  Dr. Vanessa Barbara paged.  Will continue to monitor.

## 2013-06-03 ENCOUNTER — Inpatient Hospital Stay (HOSPITAL_COMMUNITY): Payer: Medicare Other

## 2013-06-03 DIAGNOSIS — J189 Pneumonia, unspecified organism: Secondary | ICD-10-CM

## 2013-06-03 LAB — GLUCOSE, CAPILLARY
Glucose-Capillary: 181 mg/dL — ABNORMAL HIGH (ref 70–99)
Glucose-Capillary: 166 mg/dL — ABNORMAL HIGH (ref 70–99)

## 2013-06-03 MED ORDER — OXYCODONE-ACETAMINOPHEN 10-325 MG PO TABS: 1.0000 | ORAL_TABLET | Freq: Three times a day (TID) | ORAL | Status: DC | PRN

## 2013-06-03 MED ORDER — IPRATROPIUM-ALBUTEROL 0.5-2.5 (3) MG/3ML IN SOLN
3.0000 mL | Freq: Four times a day (QID) | RESPIRATORY_TRACT | Status: DC
  Administered 2013-06-03: 3 mL via RESPIRATORY_TRACT
  Filled 2013-06-03: qty 3

## 2013-06-03 MED ORDER — PREDNISONE (PAK) 10 MG PO TABS: ORAL_TABLET | ORAL | Status: DC

## 2013-06-03 MED ORDER — LEVOFLOXACIN 750 MG PO TABS: 750.0000 mg | ORAL_TABLET | Freq: Every day | ORAL | Status: DC

## 2013-06-03 NOTE — Progress Notes (Signed)
[id number] shift. Patient is alert and oriented x 4. Pt is on RA, ambulatory with minimal assist. Pt had complaints of pain and prn pain med given. No signs of distress. IV access discontinued and removed. Telemetry discontinued and removed.  Discharge instructions and medications reviewed with patient and wife. Patient and wife verbalize understanding. Transported by volunteer services via wheelchair.

## 2013-06-03 NOTE — Discharge Summary (Signed)
Physician Discharge Summary  Scott [name] NWG:[id number] DOB: 1961/01/12 DOA: 05/31/2013  PCP: Willey BladeEAN, ERIC, MD  Admit date: 05/31/2013 Discharge date: 06/03/2013  Time spent: 35 minutes  Recommendations for Outpatient Follow-up:  1. Patient admitted for COPD/CAP being discharged on steroid taper and antibiotics.   Discharge Diagnoses:  Principal Problem:   COPD exacerbation Active Problems:   Degeneration of lumbar or lumbosacral intervertebral disc   Hypoxia   Diabetes mellitus without complication   GERD (gastroesophageal reflux disease)   Acute bronchitis   Discharge Condition: Stable/Improved  Diet recommendation: Heart Healthy  Filed Weights   06/01/13 0157 06/02/13 0710 06/03/13 0648  Weight: 91.1 kg (200 lb 13.4 oz) 94.7 kg (208 lb 12.4 oz) 94 kg (207 lb 3.7 oz)    History of present illness:  Scott MountRaymond Skolnik is a 53 y.o. male with a history of COPD, and DM2 who presents to the ED with complaints of worsening SOB and cough along with wheezing over the past 3-4 days. He reports having fevers and chill and coughing up greenish sputum. When he arrived in the ED, he was hypoxic and tachypneic, And was placed on 3 liters of [place] O2 and his O2 saturation improved to 91 %. He was admininstered continuous Nebulizer treatmetns And had mild improvement. His Chest X-Ray was negative for pneumonia, and suggestive of bronchitis changes, and he was also placed on IV Levaquin due to the bronchitis. +  Hospital Course:  Patient is a pleasant 53 year old gentleman with a past medical history of tobacco abuse, chronic obstructive pulmonary disease, admitted to the medicine service on 06/01/2013. He presented with complaints of worsening cough, wheezing and shortness of breath. Initial chest x-ray showed findings suggestive of COPD with interstitial coarsening. He was felt to have COPD exacerbation likely triggered by underlying infectious process. He was started on IV steroids and empiric IV  antibiotic therapy with Levaquin and nebulizers. He had a repeat chest x-ray on 06/02/2013 which showed streaky opacity at the left lung base consistent with atelectasis. On 06/03/2013 followup chest x-ray showed improvement to left lung base opacity. He reported significant improvement by 06/03/2013, ambulated down the hallway, not require supplemental oxygen, appears to be at his baseline. Patient was discharged in stable condition on 06/03/2013.   Discharge Exam: Filed Vitals:   06/03/13 0839  BP: 120/67  Pulse: 94  Temp: 98 F (36.7 C)  Resp: 20    General: Patient is in no acute distress, he is awake alert oriented, states feeling well and anxious to go home today. Cardiovascular: Regular rate rhythm normal S1-S2 Respiratory: Significant improvement to lung exam with decreased wheezing, normal respiratory effort Abdomen: Soft nontender nondistended  Discharge Instructions You were cared for by a hospitalist during your hospital stay. If you have any questions about your discharge medications or the care you received while you were in the hospital after you are discharged, you can call the unit and asked to speak with the hospitalist on call if the hospitalist that took care of you is not available. Once you are discharged, your primary care physician will handle any further medical issues. Please note that NO REFILLS for any discharge medications will be authorized once you are discharged, as it is imperative that you return to your primary care physician (or establish a relationship with a primary care physician if you do not have one) for your aftercare needs so that they can reassess your need for medications and monitor your lab values.  Discharge Orders   Future  Orders Complete By Expires   Call MD for:  difficulty breathing, headache or visual disturbances  As directed    Call MD for:  extreme fatigue  As directed    Call MD for:  persistant dizziness or light-headedness  As  directed    Call MD for:  persistant nausea and vomiting  As directed    Call MD for:  temperature >100.4  As directed    Diet - low sodium heart healthy  As directed    Increase activity slowly  As directed        Medication List         albuterol 108 (90 BASE) MCG/ACT inhaler  Commonly known as:  PROVENTIL HFA;VENTOLIN HFA  Inhale 1-2 puffs into the lungs every 6 (six) hours as needed for wheezing or shortness of breath.     diazepam 5 MG tablet  Commonly known as:  VALIUM  Take 1 tablet (5 mg total) by mouth every 6 (six) hours as needed for anxiety.     fluticasone 50 MCG/ACT nasal spray  Commonly known as:  FLONASE  Place 2 sprays into both nostrils 2 (two) times daily.     Fluticasone-Salmeterol 250-50 MCG/DOSE Aepb  Commonly known as:  ADVAIR  Inhale 1 puff into the lungs every 12 (twelve) hours.     ipratropium-albuterol 0.5-2.5 (3) MG/3ML Soln  Commonly known as:  DUONEB  Take 3 mLs by nebulization 4 (four) times daily as needed (shortness of breath). Dx 496     Ipratropium-Albuterol 20-100 MCG/ACT Aers respimat  Commonly known as:  COMBIVENT RESPIMAT  Inhale 1 puff into the lungs 4 (four) times daily.     levofloxacin 750 MG tablet  Commonly known as:  LEVAQUIN  Take 1 tablet (750 mg total) by mouth daily.     loratadine 10 MG tablet  Commonly known as:  CLARITIN  Take 10 mg by mouth daily.     omeprazole 40 MG capsule  Commonly known as:  PRILOSEC  Take 40 mg by mouth daily.     oxyCODONE-acetaminophen 10-325 MG per tablet  Commonly known as:  PERCOCET  Take 1 tablet by mouth every 8 (eight) hours as needed for pain.     predniSONE 10 MG tablet  Commonly known as:  STERAPRED UNI-PAK  Take 6-5-4-3-2-1 tablets by mouth daily till gone.     rosuvastatin 10 MG tablet  Commonly known as:  CRESTOR  Take 10 mg by mouth daily.     sitaGLIPtin-metformin 50-500 MG per tablet  Commonly known as:  JANUMET  Take 1 tablet by mouth daily.       Allergies   Allergen Reactions  . Penicillins Anaphylaxis       Follow-up Information   Follow up with August Saucer, ERIC, MD In 1 week.   Specialty:  Internal Medicine   Contact information:   [facility] [address] [place] [place] [address] [phone]        The results of significant diagnostics from this hospitalization (including imaging, microbiology, ancillary and laboratory) are listed below for reference.    Significant Diagnostic Studies: Dg Chest 2 View  06/03/2013   CLINICAL DATA:  Followup pneumonia  EXAM: CHEST  2 VIEW  COMPARISON:  06/02/2013  FINDINGS: Left lung base opacity has mildly improved. This may reflect improved pneumonia or atelectasis. Lungs are hyperexpanded with flattened hemidiaphragms. Lungs are otherwise clear. No pleural effusion. No pneumothorax. Heart, mediastinum and hila are unremarkable.  IMPRESSION: Improved left  lung base opacity. Given the rapid improvement, the supports improved atelectasis. It still may reflect pneumonia or a combination  No new abnormalities.   Electronically Signed   By: Amie Portland M.D.   On: 06/03/2013 11:30   Dg Chest 2 View  06/02/2013   CLINICAL DATA:  Shortness breath, cough, COPD  EXAM: CHEST  2 VIEW  COMPARISON:  Chest x-ray of 05/31/2013, and 01/19/2013  FINDINGS: There is streaky opacity at the left lung base. This may represent atelectasis, but developing pneumonia cannot be excluded. No pleural effusion is seen. The lungs remain hyperaerated. Mediastinal and hilar contours are stable. Heart size is stable. A lower anterior cervical spine fusion plate is noted.  IMPRESSION: Streaky opacity at the left lung base consistent with atelectasis. Recommend followup to exclude developing pneumonia.   Electronically Signed   By: Dwyane Dee M.D.   On: 06/02/2013 07:31   Dg Chest 2 View  05/31/2013   CLINICAL DATA:  Shortness of breath  EXAM: CHEST  2 VIEW  COMPARISON:  01/19/2013  FINDINGS: Pulmonary hyperinflation  with diffuse interstitial coarsening. The interstitial prominence is mildly increased from prior. No consolidation, edema, effusion, or pneumothorax. Normal heart size. Multilevel cervical discectomy.  IMPRESSION: COPD. Interstitial coarsening has increased from priors, question bronchitis exacerbation.   Electronically Signed   By: Tiburcio Pea M.D.   On: 05/31/2013 22:38    Microbiology: No results found for this or any previous visit (from the past 240 hour(s)).   Labs: Basic Metabolic Panel:  Recent Labs Lab 05/31/13 2045 06/01/13 0435  NA 143 142  K 3.6* 4.0  CL 104 105  CO2 23 27  GLUCOSE 114* 248*  BUN 8 13  CREATININE 1.18 1.06  CALCIUM 9.0 9.6   Liver Function Tests: No results found for this basename: AST, ALT, ALKPHOS, BILITOT, PROT, ALBUMIN,  in the last 168 hours No results found for this basename: LIPASE, AMYLASE,  in the last 168 hours No results found for this basename: AMMONIA,  in the last 168 hours CBC:  Recent Labs Lab 05/31/13 2045 06/01/13 0435  WBC 9.4 8.4  NEUTROABS 6.4  --   HGB 15.1 15.0  HCT 45.4 46.1  MCV 90.6 91.5  PLT 223 222   Cardiac Enzymes: No results found for this basename: CKTOTAL, CKMB, CKMBINDEX, TROPONINI,  in the last 168 hours BNP: BNP (last 3 results)  Recent Labs  01/19/13 1654  PROBNP 45.2   CBG:  Recent Labs Lab 06/02/13 1117 06/02/13 1623 06/02/13 2121 06/03/13 0641 06/03/13 1052  GLUCAP 154* 131* 156* 181* 166*       Signed:  Rubi Tooley  [facility] 06/03/2013, 11:45 AM

## 2013-06-13 ENCOUNTER — Ambulatory Visit: Payer: Medicare Other | Admitting: Physical Medicine & Rehabilitation

## 2013-06-20 ENCOUNTER — Other Ambulatory Visit: Payer: Self-pay | Admitting: Neurosurgery

## 2013-06-20 DIAGNOSIS — M5412 Radiculopathy, cervical region: Secondary | ICD-10-CM

## 2013-06-26 ENCOUNTER — Ambulatory Visit
Admission: RE | Admit: 2013-06-26 | Discharge: 2013-06-26 | Disposition: A | Discharge status: Home / Self Care | Payer: PRIVATE HEALTH INSURANCE | Source: Ambulatory Visit | Attending: Neurosurgery | Admitting: Neurosurgery

## 2013-06-26 VITALS — BP 145/80 | HR 68

## 2013-06-26 DIAGNOSIS — M5412 Radiculopathy, cervical region: Secondary | ICD-10-CM

## 2013-06-26 DIAGNOSIS — M961 Postlaminectomy syndrome, not elsewhere classified: Secondary | ICD-10-CM

## 2013-06-26 DIAGNOSIS — M4712 Other spondylosis with myelopathy, cervical region: Secondary | ICD-10-CM

## 2013-06-26 MED ORDER — ONDANSETRON HCL 4 MG/2ML IJ SOLN
4.0000 mg | Freq: Once | INTRAMUSCULAR | Status: AC
  Administered 2013-06-26: 4 mg via INTRAMUSCULAR

## 2013-06-26 MED ORDER — IOHEXOL 300 MG/ML  SOLN
10.0000 mL | Freq: Once | INTRAMUSCULAR | Status: AC | PRN
  Administered 2013-06-26: 10 mL via INTRATHECAL

## 2013-06-26 MED ORDER — DIAZEPAM 5 MG PO TABS
10.0000 mg | ORAL_TABLET | Freq: Once | ORAL | Status: AC
  Administered 2013-06-26: 10 mg via ORAL

## 2013-06-26 MED ORDER — MEPERIDINE HCL 100 MG/ML IJ SOLN
100.0000 mg | Freq: Once | INTRAMUSCULAR | Status: AC
  Administered 2013-06-26: 100 mg via INTRAMUSCULAR

## 2013-06-26 NOTE — Discharge Instructions (Signed)

## 2013-08-13 ENCOUNTER — Other Ambulatory Visit: Payer: Self-pay | Admitting: Emergency Medicine

## 2013-09-13 ENCOUNTER — Other Ambulatory Visit: Payer: Self-pay | Admitting: Emergency Medicine

## 2013-09-17 NOTE — Telephone Encounter (Signed)
Last seen 01/2013 with recs to follow up in 2 months.  No appts pending at this time

## 2013-12-01 ENCOUNTER — Other Ambulatory Visit: Payer: Self-pay | Admitting: Emergency Medicine

## 2013-12-22 ENCOUNTER — Telehealth: Payer: Self-pay | Admitting: Emergency Medicine

## 2013-12-22 MED ORDER — IPRATROPIUM-ALBUTEROL 0.5-2.5 (3) MG/3ML IN SOLN: 3.0000 mL | Freq: Four times a day (QID) | RESPIRATORY_TRACT | Status: DC | PRN

## 2013-12-22 NOTE — Telephone Encounter (Signed)
Refill sent, pt aware. Scott [name], CMA  

## 2014-01-02 ENCOUNTER — Other Ambulatory Visit: Payer: Self-pay | Admitting: Emergency Medicine

## 2014-01-08 ENCOUNTER — Encounter: Payer: Self-pay | Admitting: Emergency Medicine

## 2014-01-08 ENCOUNTER — Ambulatory Visit (INDEPENDENT_AMBULATORY_CARE_PROVIDER_SITE_OTHER): Payer: Medicare Other | Admitting: Emergency Medicine

## 2014-01-08 VITALS — BP 128/80 | HR 90 | Ht 68.0 in | Wt 212.0 lb

## 2014-01-08 DIAGNOSIS — J441 Chronic obstructive pulmonary disease with (acute) exacerbation: Secondary | ICD-10-CM

## 2014-01-08 DIAGNOSIS — L723 Sebaceous cyst: Secondary | ICD-10-CM

## 2014-01-08 MED ORDER — PREDNISONE 10 MG PO TABS: ORAL_TABLET | ORAL | Status: DC

## 2014-01-08 MED ORDER — DOXYCYCLINE HYCLATE 100 MG PO TABS: 100.0000 mg | ORAL_TABLET | Freq: Two times a day (BID) | ORAL | Status: DC

## 2014-01-08 MED ORDER — HYDROCODONE-HOMATROPINE 5-1.5 MG/5ML PO SYRP: 5.0000 mL | ORAL_SOLUTION | Freq: Four times a day (QID) | ORAL | Status: DC | PRN

## 2014-01-08 NOTE — Patient Instructions (Signed)
Please continue your inhaled medications Take prednisone, doxycycline hycodan cough syrup as directed.  Follow with Dr Delton Coombes in 3 months or sooner if you have any problems.

## 2014-01-08 NOTE — Progress Notes (Signed)
Subjective:    Patient ID: Scott [name], male    DOB: Jul 01, 1960, 53 y.o.   MRN: [id number]  HPI 53 yo man, tobacco user (smoking 1/2 pk/day), hx of COPD in a study at [facility]. Has had PFT at [facility] and ? At [facility] as well. Now following with Dr Reche Dixon at [facility]. He has been having increased combivent use for SOB. Coughs every day, has been worse since anterior cervical fusion in November. Sputum is clear to yellow.  He is also on Advair.  He has bad GERD, better w omeprazole. Has allergy sx, on no meds.   ROV 07/11/11 -- COPD, still smoking 5 cig a day. Tells me that his breathing is the same to worse. Taking Advair bid, combivent ordered qid but he uses prn. Has nicotine patches but not using. Discussed cigarette cessation in detail today. Talked about setting a quit date Tells me that he benefited from the loratadine and fluticasone but stopped taking.   ROV 09/12/11 -- COPD, still smoking 2-3 cig a day. Returns for f/u. Has been on combivent prn + Advair bid. Using proventil, not as beneficial as the ventolin. Remains on loratadine + fluticasone. Also remains on omeprazole. No AE since last visit, although he did take some spare prednisone that he had last month. No abx. He is interested in entering a clinical trial at [facility].   ROV 12/11/11 -- COPD. Returns for f/u. Has been on combivent prn + Advair bid. He had stopped smoking x 2 weeks but then restarted a few days ago, currently on 2-3 a day. He had spirometry as part of clinical trial at [facility] 12/05/11, FEV1 stable at 1.61L (43% pred), ratio 52%. Uses SABA 4-6 x a day. Planning to quit tobacco at [facility]. No AE since last visit.   ROV 03/19/12 -- COPD. Returns for f/u. Has been on combivent prn + Advair bid. He was able to quit tobacco x 10 weeks! He had a slip-up over the last week, but is trying to quit again. He has a quit-coach through the [facility] quit line. He is having some drainage and cough for the last 2 days. No known URI  exposure. Uses combivent more freq than prescribed.  >>no changes   Acute OV 04/08/12 --  Complains of prod cough with green mucus, wheezing, increased SOB, cold sweats x10days, will finish abx and pred taper today Called in abx via telephone message on 3/10 CXR today shows no acute changes  Ran out of atrovent nebs. Only has albuterol nebs.  No smoking since last ov.  NO hemoptysis , chest pain , or edema.   ROV 06/21/12 -- COPD, allergies, GERD, cough. Was seen 3/17 for bronchitis, was treated with pred + abx. He improved. For the last 4-6 weeks has been dealing with more cough and mucous, has been out of loratadine and fluticasone for months. He is on combivent + Advair. He didn't have any albuterol so he started using combivent prn.   ROV 09/20/12 -- COPD, allergies, GERD, cough. Quit smoking 2 months. FEV1 1.61 (43%) in 2013. He presents today for pre-operative risk eval before a planned repeat cervical fusion by Dr Lovell Sheehan.  He tells me that he has been breathing fairly well, although he developed some URI sx last 3-4 days. Has a cough with clear mucous. No wheezing. He is on Advair + combivent or ventolin prn. He uses ~3x a day.   ROV 02/11/13 -- COPD, allergies, GERD, cough. Returns for f/u. He  has been contacted by a company (he doesn't have the info w him today) regarding his breathing at night, ? Whether this was nocturnal oximetry. He is on advair + combivent prn, averages 3-4 uses daily. He has not gone back to smoking. He got through the cervical fusion well.  [facility] ago for an AE - now improved  ROV 01/08/14 -- follow up visit for COPD, cough with allergies and GERD. He had a flare in May '15, was admitted. He developed URI sx about 2 weeks ago. He is now having wheeze, nasal congestion, cough with yellow mucous. He has been nyquil prn. He is on Advair; takes combivent prn and duonebs qid on a schedule. He had a lapse of smoking but quit again about 7 months  ago.  He using BiPAP qhs, ? With heated humidity.     Objective:   Physical Exam  Filed Vitals:   01/08/14 1618  BP: 128/80  Pulse: 90  Height: 5\' 8"  (1.727 m)  Weight: 212 lb (96.163 kg)  SpO2: 96%   Gen: Pleasant, well-nourished, in no distress  ENT: No lesions,  mouth clear,  oropharynx clear, no postnasal drip,   Neck: No JVD, no TMG, no carotid bruits  Lungs: scattered rhonchi with no wheezes, loud wheeze on a forced exp  Cardiovascular: RRR, heart sounds normal, no murmur or gallops, no peripheral edema  Musculoskeletal: No deformities, no cyanosis or clubbing  Neuro: alert, non focal  Skin: Warm, 1.5cm sebaceous cyst R upper chest      Assessment & Plan:  COPD exacerbation He does appear to be exacerbated. We will need to be rx with pred + abx. He is requesting hycodan. Will continue his other meds as he is taking them   Sebaceous cyst On his R upper chest. I was able to express almost all of the sebum for him, then cleaned the skin. I did warn him that it will probably come back since the core remains. He will discuss with his PCP, consider having it removed.

## 2014-01-08 NOTE — Assessment & Plan Note (Signed)
He does appear to be exacerbated. We will need to be rx with pred + abx. He is requesting hycodan. Will continue his other meds as he is taking them

## 2014-01-08 NOTE — Assessment & Plan Note (Signed)
On his R upper chest. I was able to express almost all of the sebum for him, then cleaned the skin. I did warn him that it will probably come back since the core remains. He will discuss with his PCP, consider having it removed.

## 2014-01-09 ENCOUNTER — Telehealth: Payer: Self-pay | Admitting: Emergency Medicine

## 2014-01-09 NOTE — Telephone Encounter (Signed)
He will have to use delsym or an OTC alternative

## 2014-01-09 NOTE — Telephone Encounter (Signed)
Per 01/08/14: Patient Instructions       Please continue your inhaled medications Take prednisone, doxycycline hycodan cough syrup as directed.  Follow with Dr Delton Coombes in 3 months or sooner if you have any problems  --  Called spoke with pt. He reports the hycodan is not covered by insurance. I made him aware no prescription cough syrup is covered by any insurance. He reports RB told him to call if this was so. Pt wants to be called back ASAP. Please advise thanks.  Allergies  Allergen Reactions  . Penicillins Anaphylaxis     Current Outpatient Prescriptions on File Prior to Visit  Medication Sig Dispense Refill  . ADVAIR DISKUS 250-50 MCG/DOSE AEPB TAKE 1 INHALATION BY MOUTH EVERY 12 HOURS 60 each 0  . COMBIVENT RESPIMAT 20-100 MCG/ACT AERS respimat TAKE 1 PUFF BY MOUTH INTO THE LUNGS FOUR TIMES A DAY **OVERDUE FOR APPOINTMENT WITH DR.BYRUM** 1 Inhaler 0  . diazepam (VALIUM) 5 MG tablet Take 1 tablet (5 mg total) by mouth every 6 (six) hours as needed for anxiety. (Patient taking differently: Take 5 mg by mouth every 12 (twelve) hours as needed for anxiety. ) 60 tablet 0  . doxycycline (VIBRA-TABS) 100 MG tablet Take 1 tablet (100 mg total) by mouth 2 (two) times daily. 14 tablet 0  . fluticasone (FLONASE) 50 MCG/ACT nasal spray INHALE 2 SPRAYS INTO BOTH NOSTRILS TWICE DAILY 16 g 0  . HYDROcodone-homatropine (HYCODAN) 5-1.5 MG/5ML syrup Take 5 mLs by mouth every 6 (six) hours as needed for cough. 240 mL 0  . ipratropium-albuterol (DUONEB) 0.5-2.5 (3) MG/3ML SOLN Take 3 mLs by nebulization 4 (four) times daily as needed (shortness of breath). Dx 496 360 mL 6  . loratadine (CLARITIN) 10 MG tablet Take 10 mg by mouth daily.    Marland Kitchen omeprazole (PRILOSEC) 40 MG capsule Take 40 mg by mouth daily.    Marland Kitchen oxyCODONE-acetaminophen (PERCOCET) 10-325 MG per tablet Take 1 tablet by mouth every 8 (eight) hours as needed for pain. 5 tablet 0  . predniSONE (DELTASONE) 10 MG tablet Take 40mg  x3 days, then 30mg  X3  days, then 20mg  X3 days, then 10mg  X3 days. 30 tablet 0  . rosuvastatin (CRESTOR) 10 MG tablet Take 10 mg by mouth daily.    . sitaGLIPtin-metformin (JANUMET) 50-500 MG per tablet Take 1 tablet by mouth daily.    . VENTOLIN HFA 108 (90 BASE) MCG/ACT inhaler INHALE 1-2 PUFFS BY MOUTH EVERY 6 HOURS AS NEEDED FOR WHEEZING OR SHORTNESS OF BREATH 18 g 0   No current facility-administered medications on file prior to visit.

## 2014-01-09 NOTE — Telephone Encounter (Signed)
lmomtcb x1 

## 2014-01-09 NOTE — Telephone Encounter (Signed)
Pt called back. Aware of recs. He voiced understanding and needed nothing further

## 2014-01-30 ENCOUNTER — Other Ambulatory Visit: Payer: Self-pay | Admitting: Emergency Medicine

## 2014-05-01 ENCOUNTER — Encounter: Payer: Self-pay | Admitting: Emergency Medicine

## 2014-05-01 ENCOUNTER — Ambulatory Visit (INDEPENDENT_AMBULATORY_CARE_PROVIDER_SITE_OTHER): Payer: Medicare Other | Admitting: Emergency Medicine

## 2014-05-01 VITALS — BP 142/92 | HR 85 | Wt 214.0 lb

## 2014-05-01 DIAGNOSIS — J449 Chronic obstructive pulmonary disease, unspecified: Secondary | ICD-10-CM | POA: Diagnosis not present

## 2014-05-01 DIAGNOSIS — J309 Allergic rhinitis, unspecified: Secondary | ICD-10-CM | POA: Diagnosis not present

## 2014-05-01 MED ORDER — ALBUTEROL SULFATE HFA 108 (90 BASE) MCG/ACT IN AERS: 1.0000 | INHALATION_SPRAY | Freq: Four times a day (QID) | RESPIRATORY_TRACT | Status: DC | PRN

## 2014-05-01 MED ORDER — FLUTICASONE-SALMETEROL 250-50 MCG/DOSE IN AEPB: 1.0000 | INHALATION_SPRAY | Freq: Two times a day (BID) | RESPIRATORY_TRACT | Status: DC

## 2014-05-01 MED ORDER — HYDROCODONE-HOMATROPINE 5-1.5 MG/5ML PO SYRP: 5.0000 mL | ORAL_SOLUTION | Freq: Four times a day (QID) | ORAL | Status: DC | PRN

## 2014-05-01 NOTE — Assessment & Plan Note (Signed)
We will add nasal saline washes to his loratadine and nasal steroid He requested Hycodan and we will prescribe this when necessary

## 2014-05-01 NOTE — Assessment & Plan Note (Signed)
Moderately well managed, seems to be somewhat exacerbated by allergy season. For now we will continue his maintenance regimen of Advair, Combivent, and albuterol when necessary

## 2014-05-01 NOTE — Patient Instructions (Signed)
Please continue your Claritin and your nasal steroid spray Try starting nasal saline washes once a day. You can get a device to deliver these at your local drug store.  Please continue your Advair twice a day Use your Combivent 4 times a day Use albuterol 2 puffs as needed Use Hycodan up to every 6 hours as needed for cough Follow with Dr Delton Coombes in 6 months or sooner if you have any problems

## 2014-05-01 NOTE — Progress Notes (Signed)
Subjective:    Patient ID: [name], male    DOB: 29-Jul-1960, 54 y.o.   MRN: [id number]  HPI 54 yo man, tobacco user (smoking 1/2 pk/day), hx of COPD in a study at [facility]. Has had PFT at [facility] and ? At [facility] as well. Now following with Dr Reche Dixon at [facility]. He has been having increased combivent use for SOB. Coughs every day, has been worse since anterior cervical fusion in November. Sputum is clear to yellow.  He is also on Advair.  He has bad GERD, better w omeprazole. Has allergy sx, on no meds.   ROV 07/11/11 -- COPD, still smoking 5 cig a day. Tells me that his breathing is the same to worse. Taking Advair bid, combivent ordered qid but he uses prn. Has nicotine patches but not using. Discussed cigarette cessation in detail today. Talked about setting a quit date Tells me that he benefited from the loratadine and fluticasone but stopped taking.   ROV 09/12/11 -- COPD, still smoking 2-3 cig a day. Returns for f/u. Has been on combivent prn + Advair bid. Using proventil, not as beneficial as the ventolin. Remains on loratadine + fluticasone. Also remains on omeprazole. No AE since last visit, although he did take some spare prednisone that he had last month. No abx. He is interested in entering a clinical trial at [facility].   ROV 12/11/11 -- COPD. Returns for f/u. Has been on combivent prn + Advair bid. He had stopped smoking x 2 weeks but then restarted a few days ago, currently on 2-3 a day. He had spirometry as part of clinical trial at [facility] 12/05/11, FEV1 stable at 1.61L (43% pred), ratio 52%. Uses SABA 4-6 x a day. Planning to quit tobacco at [facility]. No AE since last visit.   ROV 03/19/12 -- COPD. Returns for f/u. Has been on combivent prn + Advair bid. He was able to quit tobacco x 10 weeks! He had a slip-up over the last week, but is trying to quit again. He has a quit-coach through the [facility] quit line. He is having some drainage and cough for the last 2 days. No known URI  exposure. Uses combivent more freq than prescribed.  >>no changes   Acute OV 04/08/12 --  Complains of prod cough with green mucus, wheezing, increased SOB, cold sweats x10days, will finish abx and pred taper today Called in abx via telephone message on 3/10 CXR today shows no acute changes  Ran out of atrovent nebs. Only has albuterol nebs.  No smoking since last ov.  NO hemoptysis , chest pain , or edema.   ROV 06/21/12 -- COPD, allergies, GERD, cough. Was seen 3/17 for bronchitis, was treated with pred + abx. He improved. For the last 4-6 weeks has been dealing with more cough and mucous, has been out of loratadine and fluticasone for months. He is on combivent + Advair. He didn't have any albuterol so he started using combivent prn.   ROV 09/20/12 -- COPD, allergies, GERD, cough. Quit smoking 2 months. FEV1 1.61 (43%) in 2013. He presents today for pre-operative risk eval before a planned repeat cervical fusion by Dr Lovell Sheehan.  He tells me that he has been breathing fairly well, although he developed some URI sx last 3-4 days. Has a cough with clear mucous. No wheezing. He is on Advair + combivent or ventolin prn. He uses ~3x a day.   ROV 02/11/13 -- COPD, allergies, GERD, cough. Returns for f/u. He  has been contacted by a company (he doesn't have the info w him today) regarding his breathing at night, ? Whether this was nocturnal oximetry. He is on advair + combivent prn, averages 3-4 uses daily. He has not gone back to smoking. He got through the cervical fusion well.  He was hospitalized at [facility] 3 weeks ago for an AE - now improved  ROV 01/08/14 -- follow up visit for COPD, cough with allergies and GERD. He had a flare in May '15, was admitted. He developed URI sx about 2 weeks ago. He is now having wheeze, nasal congestion, cough with yellow mucous. He has been nyquil prn. He is on Advair; takes combivent prn and duonebs qid on a schedule. He had a lapse of smoking but quit again about 7 months  ago.  He using BiPAP qhs, ? With heated humidity.   ROV 05/01/14 -- has a hx COPD and allergies, follows for cough, allergies, developing chest tightness.  He is using Advair, appears to be interchanging the ventolin and combivent prn.  He is using fluticasone nasal spray, loratadine.  He is not using his BiPAP, says he needs humidity.  He stopped smoking about a year ago. Cough is productive of white / yellow.     Objective:   Physical Exam  Filed Vitals:   05/01/14 1534 05/01/14 1535  BP:  142/92  Pulse:  85  Weight: 214 lb (97.07 kg)   SpO2:  93%   Gen: Pleasant, well-nourished, in no distress  ENT: No lesions,  mouth clear,  oropharynx clear, no postnasal drip,   Neck: No JVD, no TMG, no carotid bruits  Lungs: scattered rhonchi with no wheezes, loud wheeze on a forced exp  Cardiovascular: RRR, heart sounds normal, no murmur or gallops, no peripheral edema  Musculoskeletal: No deformities, no cyanosis or clubbing  Neuro: alert, non focal  Skin: Warm, 1.5cm sebaceous cyst R upper chest      Assessment & Plan:  COPD (chronic obstructive pulmonary disease) Moderately well managed, seems to be somewhat exacerbated by allergy season. For now we will continue his maintenance regimen of Advair, Combivent, and albuterol when necessary   Allergic rhinitis We will add nasal saline washes to his loratadine and nasal steroid He requested Hycodan and we will prescribe this when necessary

## 2014-07-17 ENCOUNTER — Other Ambulatory Visit: Payer: Self-pay | Admitting: Emergency Medicine

## 2014-07-30 ENCOUNTER — Other Ambulatory Visit: Payer: Self-pay | Admitting: Internal Medicine

## 2014-07-30 DIAGNOSIS — M545 Low back pain: Secondary | ICD-10-CM

## 2014-08-04 ENCOUNTER — Other Ambulatory Visit: Payer: Medicare Other

## 2014-08-10 ENCOUNTER — Other Ambulatory Visit: Payer: Self-pay | Admitting: Emergency Medicine

## 2014-08-14 ENCOUNTER — Ambulatory Visit
Admission: RE | Admit: 2014-08-14 | Discharge: 2014-08-14 | Disposition: A | Discharge status: Home / Self Care | Payer: Medicare Other | Source: Ambulatory Visit | Attending: Internal Medicine | Admitting: Internal Medicine

## 2014-08-14 ENCOUNTER — Other Ambulatory Visit: Payer: Medicare Other

## 2014-08-14 DIAGNOSIS — M545 Low back pain: Secondary | ICD-10-CM

## 2014-12-09 ENCOUNTER — Telehealth: Payer: Self-pay | Admitting: Emergency Medicine

## 2014-12-09 NOTE — Telephone Encounter (Signed)
LM for pt to call back.

## 2014-12-10 ENCOUNTER — Telehealth: Payer: Self-pay | Admitting: Emergency Medicine

## 2014-12-10 MED ORDER — IPRATROPIUM-ALBUTEROL 0.5-2.5 (3) MG/3ML IN SOLN: RESPIRATORY_TRACT | Status: DC

## 2014-12-10 MED ORDER — IPRATROPIUM-ALBUTEROL 0.5-2.5 (3) MG/3ML IN SOLN: 3.0000 mL | Freq: Four times a day (QID) | RESPIRATORY_TRACT | Status: DC | PRN

## 2014-12-10 NOTE — Telephone Encounter (Signed)
Called and spoke with pharmacist with [facility] Was informed that pharmacy received rx for refill on pt's Duonebs yesterday but it can not be filled due to instructions stated QID PRN Pharmacist stated that due to medicare guidelines rx would have to be changed to either just QID or QID as directed by physician Informed pharmacist that i would sent message to provider to see if rx could be changed to match medicare guidelines   Dr Delton Coombes, please advise if you are ok with this. Thanks

## 2014-12-10 NOTE — Telephone Encounter (Signed)
Pt last seen on 05/01/14 with RB Patient Instructions     Please continue your Claritin and your nasal steroid spray Try starting nasal saline washes once a day. You can get a device to deliver these at your local drug store.  Please continue your Advair twice a day Use your Combivent 4 times a day Use albuterol 2 puffs as needed Use Hycodan up to every 6 hours as needed for cough Follow with Dr Delton Coombes in 6 months or sooner if you have any problems      Called and spoke with pt. Pt stated he needed a refill on Du[facility]b [facility]. Rx was sent to pharmacy. Pt voiced understanding and had no further questions. Nothing further needed.

## 2014-12-10 NOTE — Telephone Encounter (Signed)
Please change the prescription to read "up to every 6 hours as directed by physician"

## 2014-12-10 NOTE — Telephone Encounter (Signed)
New rx sent with updated instruction electronically  Nothing further is needed

## 2014-12-11 ENCOUNTER — Ambulatory Visit (INDEPENDENT_AMBULATORY_CARE_PROVIDER_SITE_OTHER): Payer: Medicare Other | Admitting: Emergency Medicine

## 2014-12-11 ENCOUNTER — Encounter: Payer: Self-pay | Admitting: Emergency Medicine

## 2014-12-11 VITALS — BP 142/102 | HR 98 | Ht 68.0 in | Wt 207.0 lb

## 2014-12-11 DIAGNOSIS — J309 Allergic rhinitis, unspecified: Secondary | ICD-10-CM

## 2014-12-11 DIAGNOSIS — Z23 Encounter for immunization: Secondary | ICD-10-CM

## 2014-12-11 DIAGNOSIS — J449 Chronic obstructive pulmonary disease, unspecified: Secondary | ICD-10-CM | POA: Diagnosis not present

## 2014-12-11 DIAGNOSIS — R0902 Hypoxemia: Secondary | ICD-10-CM | POA: Diagnosis not present

## 2014-12-11 MED ORDER — OXYCODONE-ACETAMINOPHEN 5-325 MG PO TABS: 1.0000 | ORAL_TABLET | ORAL | Status: DC | PRN

## 2014-12-11 NOTE — Progress Notes (Signed)
Subjective:    Patient ID: Scott [name], male    DOB: [birth year], 54 y.o.   MRN: [id number]  HPI 54 yo man, tobacco user (smoking 1/2 pk/day), hx of COPD in a study at [facility]. Has had PFT at [facility] and ? At [facility] as well. Now following with Dr Reche Dixon at [facility]. He has been having increased combivent use for SOB. Coughs every day, has been worse since anterior cervical fusion in November. Sputum is clear to yellow.  He is also on Advair.  He has bad GERD, better w omeprazole. Has allergy sx, on no meds.   ROV 07/11/11 -- COPD, still smoking 5 cig a day. Tells me that his breathing is the same to worse. Taking Advair bid, combivent ordered qid but he uses prn. Has nicotine patches but not using. Discussed cigarette cessation in detail today. Talked about setting a quit date Tells me that he benefited from the loratadine and fluticasone but stopped taking.   ROV 09/12/11 -- COPD, still smoking 2-3 cig a day. Returns for f/u. Has been on combivent prn + Advair bid. Using proventil, not as beneficial as the ventolin. Remains on loratadine + fluticasone. Also remains on omeprazole. No AE since last visit, although he did take some spare prednisone that he had last month. No abx. He is interested in entering a clinical trial at [facility].   ROV 12/11/11 -- COPD. Returns for f/u. Has been on combivent prn + Advair bid. He had stopped smoking x 2 weeks but then restarted a few days ago, currently on 2-3 a day. He had spirometry as part of clinical trial at [facility] 12/05/11, FEV1 stable at 1.61L (43% pred), ratio 52%. Uses SABA 4-6 x a day. Planning to quit tobacco at [facility]. No AE since last visit.   ROV 03/19/12 -- COPD. Returns for f/u. Has been on combivent prn + Advair bid. He was able to quit tobacco x 10 weeks! He had a slip-up over the last week, but is trying to quit again. He has a quit-coach through the [facility] quit line. He is having some drainage and cough for the last 2 days. No known URI  exposure. Uses combivent more freq than prescribed.  >>no changes   Acute OV 04/08/12 --  Complains of prod cough with green mucus, wheezing, increased SOB, cold sweats x10days, will finish abx and pred taper today Called in abx via telephone message on 3/10 CXR today shows no acute changes  Ran out of atrovent nebs. Only has albuterol nebs.  No smoking since last ov.  NO hemoptysis , chest pain , or edema.   ROV 06/21/12 -- COPD, allergies, GERD, cough. Was seen 3/17 for bronchitis, was treated with pred + abx. He improved. For the last 4-6 weeks has been dealing with more cough and mucous, has been out of loratadine and fluticasone for months. He is on combivent + Advair. He didn't have any albuterol so he started using combivent prn.   ROV 09/20/12 -- COPD, allergies, GERD, cough. Quit smoking 2 months. FEV1 1.61 (43%) in 2013. He presents today for pre-operative risk eval before a planned repeat cervical fusion by Dr Lovell Sheehan.  He tells me that he has been breathing fairly well, although he developed some URI sx last 3-4 days. Has a cough with clear mucous. No wheezing. He is on Advair + combivent or ventolin prn. He uses ~3x a day.   ROV 02/11/13 -- COPD, allergies, GERD, cough. Returns for f/u. He  has been contacted by a company (he doesn't have the info w him today) regarding his breathing at night, ? Whether this was nocturnal oximetry. He is on advair + combivent prn, averages 3-4 uses daily. He has not gone back to smoking. He got through the cervical fusion well.  He was hospitalized at [facility] 3 weeks ago for an AE - now improved  ROV 01/08/14 -- follow up visit for COPD, cough with allergies and GERD. He had a flare in May '15, was admitted. He developed URI sx about 2 weeks ago. He is now having wheeze, nasal congestion, cough with yellow mucous. He has been nyquil prn. He is on Advair; takes combivent prn and duonebs qid on a schedule. He had a lapse of smoking but quit again about 7 months  ago.  He using BiPAP qhs, ? With heated humidity.   ROV 05/01/14 -- has a hx COPD and allergies, follows for cough, allergies, developing chest tightness.  He is using Advair, appears to be interchanging the ventolin and combivent prn.  He is using fluticasone nasal spray, loratadine.  He is not using his nocturnal O2, says he needs humidity.  He stopped smoking about a year ago. Cough is productive of white / yellow.   ROV 12/11/14 -- follow-up visit for COPD, tobacco use, allergic rhinitis with associated cough. Also with chronic hypercapnic respiratory failure, has been on nocturnal O2. There was a death in his family and he started back smoking, about 5-7 a day. His breathing has worsened some since last time, ? Some influence from the weather. He is using combivent 5-6x a day on some days. Advair bid. Having poor sleep.  He snores, has poor sleep waking through the night. Remains on loratadine and fluticasone. Requesting hydrocodone for cough.    CAT Score 12/11/2014 03/18/2012 12/11/2011  Total CAT Score 36 [phone] 28     Objective:   Physical Exam  Filed Vitals:   12/11/14 0854 12/11/14 0855  BP:  142/102  Pulse:  98  Height:  (1.727 m)   Weight: 207 lb (93.895 kg)   SpO2:  90%   Gen: Pleasant, well-nourished, in no distress  ENT: No lesions,  mouth clear,  oropharynx clear, no postnasal drip,   Neck: No JVD, no TMG, no carotid bruits  Lungs: mostly clear, focal expiratory wheeze in the right upper lung zone  Cardiovascular: RRR, heart sounds normal, no murmur or gallops, no peripheral edema  Musculoskeletal: No deformities, no cyanosis or clubbing  Neuro: alert, non focal  Skin: Warm     Assessment & Plan:  COPD (chronic obstructive pulmonary disease) Less well controlled since he restarted smoking and with the fall allergy season. We discussed smoking cessation techniques today. He will cut down and then we will initiate nicotine patches when he is ready to set a quit  date. He requested oxycodone for cough suppression and a one-time prescription was written because this is cheaper than the Hycodan cough syrup.   Please continue Advair twice a day Use Combivent 2 puffs up to every 6 hours if needed for shortness of breath We need to work hard on stopping smoking. Cut down cigarettes to 3 per day and then decide when he will to set your quit date. When you know your quit date please call our office and we will order nicotine patches for you to help stop.  Continue  loratadine and fluticasone nasal spray We will write a one-time prescription for Percocet to use  for cough suppression and also pain.  Flu shot today Follow with Dr Delton Coombes in 2 months or sooner if you have any problems.  Allergic rhinitis Fluticasone nasal spray, loratadine  Hypoxia Nocturnal O2

## 2014-12-11 NOTE — Addendum Note (Signed)
Addended by: Angelene Giovanni on: 12/11/2014 09:27 AM   Modules accepted: Orders

## 2014-12-11 NOTE — Assessment & Plan Note (Signed)
Fluticasone nasal spray, loratadine

## 2014-12-11 NOTE — Assessment & Plan Note (Signed)
Less well controlled since he restarted smoking and with the fall allergy season. We discussed smoking cessation techniques today. He will cut down and then we will initiate nicotine patches when he is ready to set a quit date. He requested oxycodone for cough suppression and a one-time prescription was written because this is cheaper than the Hycodan cough syrup.   Please continue Advair twice a day Use Combivent 2 puffs up to every 6 hours if needed for shortness of breath We need to work hard on stopping smoking. Cut down cigarettes to 3 per day and then decide when he will to set your quit date. When you know your quit date please call our office and we will order nicotine patches for you to help stop.  Continue  loratadine and fluticasone nasal spray We will write a one-time prescription for Percocet to use for cough suppression and also pain.  Flu shot today Follow with Dr Delton Coombes in 2 months or sooner if you have any problems.

## 2014-12-11 NOTE — Assessment & Plan Note (Signed)
Nocturnal O2

## 2014-12-11 NOTE — Patient Instructions (Addendum)
Please continue Advair twice a day Use Combivent 2 puffs up to every 6 hours if needed for shortness of breath We need to work hard on stopping smoking. Cut down cigarettes to 3 per day and then decide when he will to set your quit date. When you know your quit date please call our office and we will order nicotine patches for you to help stop.  Continue  loratadine and fluticasone nasal spray We will write a one-time prescription for Percocet to use for cough suppression and also pain.  Flu shot today Follow with Dr Delton Coombes in 2 months or sooner if you have any problems.

## 2014-12-14 ENCOUNTER — Telehealth: Payer: Self-pay | Admitting: Emergency Medicine

## 2014-12-14 MED ORDER — IPRATROPIUM-ALBUTEROL 0.5-2.5 (3) MG/3ML IN SOLN: RESPIRATORY_TRACT | Status: DC

## 2014-12-14 NOTE — Telephone Encounter (Signed)
Called spoke with Okey Regal from [place].  She never received pt updated RX fro duoneb. She needs this refaxed over to [phone]. I have done so. Nothing further needed

## 2015-01-01 ENCOUNTER — Other Ambulatory Visit: Payer: Self-pay | Admitting: Emergency Medicine

## 2015-01-12 ENCOUNTER — Telehealth: Payer: Self-pay | Admitting: Emergency Medicine

## 2015-01-12 MED ORDER — DOXYCYCLINE HYCLATE 100 MG PO TABS: 100.0000 mg | ORAL_TABLET | Freq: Two times a day (BID) | ORAL | Status: DC

## 2015-01-12 NOTE — Telephone Encounter (Signed)
Called spoke with pt. Made aware RB has clinic this afternoon and we will call as soon as he addresses his message.

## 2015-01-12 NOTE — Telephone Encounter (Signed)
[phone] pt calling back

## 2015-01-12 NOTE — Telephone Encounter (Signed)
Patient Returned call  pt states to please talk to wife [phone]

## 2015-01-12 NOTE — Telephone Encounter (Signed)
Pt can take doxycycline 100 bid x 7 days.  I can't call in any pain medication for him.

## 2015-01-12 NOTE — Telephone Encounter (Signed)
Spoke with pt, aware of recs.  Pt upset that no pain medication is being written for him.  I advised that RB had recommended only the abx for him at this time.  rx called in to preferred pharmacy for pt.  Pt requesting this message be sent back to RB to see if he can write a rx for pain medicine, and pick up the rx in the office.    RB please advise.  Thanks.

## 2015-01-12 NOTE — Telephone Encounter (Signed)
Patient complains that for the last 3 days, his chest has been very tight, spitting out a lot of yellow phlegm, feels very weak, SOB, deep cough. Patient doesn't have an appointment until 02/18/15, he would like to have 1 more prescription of pain medication and antibiotics to last until next OV.  Pharmacy: [facility]  Allergies  Allergen Reactions  . Penicillins Anaphylaxis    Pt says that if he does not answer the phone, okay to speak with wife.

## 2015-01-12 NOTE — Telephone Encounter (Signed)
Called to speak with patient, lady answered phone and said that he was not at home at the moment and that he will call our office back when he returns. Awaiting call back from patient.

## 2015-01-13 NOTE — Telephone Encounter (Signed)
[phone], pt cb

## 2015-01-13 NOTE — Telephone Encounter (Signed)
LMTCB x1 for pt.  

## 2015-01-13 NOTE — Telephone Encounter (Signed)
Patient says that he is taking the antibiotic that was given and he is feeling a little better.  He said that he is having pain in his ribs when he coughs, but he said that he will wait until his appointment in January and discuss with Dr. Delton Coombes at that time. Nothing further needed.

## 2015-01-13 NOTE — Telephone Encounter (Signed)
As indicated in the instructions from our last office note, I'm not comfortable prescribing recurrent prescriptions of narcotics for cough or otherwise.  I/m not going to refill this. I made this clear to him at the OV and on his paperwork. I'm happy to see him in office to come up with an alternative plan.

## 2015-02-18 ENCOUNTER — Ambulatory Visit (INDEPENDENT_AMBULATORY_CARE_PROVIDER_SITE_OTHER): Payer: Medicare Other | Admitting: Emergency Medicine

## 2015-02-18 ENCOUNTER — Encounter: Payer: Self-pay | Admitting: Emergency Medicine

## 2015-02-18 VITALS — BP 132/82 | HR 98 | Ht 68.0 in | Wt 209.4 lb

## 2015-02-18 DIAGNOSIS — J449 Chronic obstructive pulmonary disease, unspecified: Secondary | ICD-10-CM | POA: Diagnosis not present

## 2015-02-18 DIAGNOSIS — M4712 Other spondylosis with myelopathy, cervical region: Secondary | ICD-10-CM

## 2015-02-18 MED ORDER — PREDNISONE 10 MG PO TABS: ORAL_TABLET | ORAL | Status: DC

## 2015-02-18 MED ORDER — DOXYCYCLINE HYCLATE 100 MG PO TABS: 100.0000 mg | ORAL_TABLET | Freq: Two times a day (BID) | ORAL | Status: DC

## 2015-02-18 NOTE — Assessment & Plan Note (Signed)
With an acute exacerbation in the setting of a recent URI. He will need to be treated with antibiotics and prednisone as below.   Please continue your combivent 2 puffs four times a day  Please continue Advair twice a day. Use albuterol 2 puffs as needed for shortness of breath.  Take doxycycline 100mg  twice a day for 7 days Take prednisone taper as directed until completely gone  You have to stop smoking. Your lung disease will continue to worsen until you are able to do this.  Follow with Dr Delton Coombes in 3 months or sooner if you have any problems.

## 2015-02-18 NOTE — Assessment & Plan Note (Signed)
He asked for narcotics today. Note that he is planning to start going to pain clinic. I told him that I am unable to fill narcotics for him and he voiced understanding.

## 2015-02-18 NOTE — Patient Instructions (Addendum)
Please continue your combivent 2 puffs four times a day  Please continue Advair twice a day. Use albuterol 2 puffs as needed for shortness of breath.  Take doxycycline 100mg  twice a day for 7 days Take prednisone taper as directed until completely gone  You have to stop smoking. Your lung disease will continue to worsen until you are able to do this.  Follow with Dr Delton Coombes in 3 months or sooner if you have any problems.

## 2015-02-18 NOTE — Progress Notes (Signed)
Subjective:    Patient ID: Scott [name], male    DOB: [birth year], 55 y.o.   MRN: [id number]  HPI 55 yo man, tobacco user (smoking 1/2 pk/day), hx of COPD in a study at [facility]. Has had PFT at [facility] and ? At [facility] as well. Now following with Dr Reche Dixon at [facility]. He has been having increased combivent use for SOB. Coughs every day, has been worse since anterior cervical fusion in November. Sputum is clear to yellow.  He is also on Advair.  He has bad GERD, better w omeprazole. Has allergy sx, on no meds.   ROV 07/11/11 -- COPD, still smoking 5 cig a day. Tells me that his breathing is the same to worse. Taking Advair bid, combivent ordered qid but he uses prn. Has nicotine patches but not using. Discussed cigarette cessation in detail today. Talked about setting a quit date Tells me that he benefited from the loratadine and fluticasone but stopped taking.   ROV 09/12/11 -- COPD, still smoking 2-3 cig a day. Returns for f/u. Has been on combivent prn + Advair bid. Using proventil, not as beneficial as the ventolin. Remains on loratadine + fluticasone. Also remains on omeprazole. No AE since last visit, although he did take some spare prednisone that he had last month. No abx. He is interested in entering a clinical trial at [facility].   ROV 12/11/11 -- COPD. Returns for f/u. Has been on combivent prn + Advair bid. He had stopped smoking x 2 weeks but then restarted a few days ago, currently on 2-3 a day. He had spirometry as part of clinical trial at [facility] 12/05/11, FEV1 stable at 1.61L (43% pred), ratio 52%. Uses SABA 4-6 x a day. Planning to quit tobacco at [facility]. No AE since last visit.   ROV 03/19/12 -- COPD. Returns for f/u. Has been on combivent prn + Advair bid. He was able to quit tobacco x 10 weeks! He had a slip-up over the last week, but is trying to quit again. He has a quit-coach through the [facility] quit line. He is having some drainage and cough for the last 2 days. No known URI  exposure. Uses combivent more freq than prescribed.  >>no changes   Acute OV 04/08/12 --  Complains of prod cough with green mucus, wheezing, increased SOB, cold sweats x10days, will finish abx and pred taper today Called in abx via telephone message on 3/10 CXR today shows no acute changes  Ran out of atrovent nebs. Only has albuterol nebs.  No smoking since last ov.  NO hemoptysis , chest pain , or edema.   ROV 06/21/12 -- COPD, allergies, GERD, cough. Was seen 3/17 for bronchitis, was treated with pred + abx. He improved. For the last 4-6 weeks has been dealing with more cough and mucous, has been out of loratadine and fluticasone for months. He is on combivent + Advair. He didn't have any albuterol so he started using combivent prn.   ROV 09/20/12 -- COPD, allergies, GERD, cough. Quit smoking 2 months. FEV1 1.61 (43%) in 2013. He presents today for pre-operative risk eval before a planned repeat cervical fusion by Dr Lovell Sheehan.  He tells me that he has been breathing fairly well, although he developed some URI sx last 3-4 days. Has a cough with clear mucous. No wheezing. He is on Advair + combivent or ventolin prn. He uses ~3x a day.   ROV 02/11/13 -- COPD, allergies, GERD, cough. Returns for f/u. He  has been contacted by a company (he doesn't have the info w him today) regarding his breathing at night, ? Whether this was nocturnal oximetry. He is on advair + combivent prn, averages 3-4 uses daily. He has not gone back to smoking. He got through the cervical fusion well.  He was hospitalized at [facility] 3 weeks ago for an AE - now improved  ROV 01/08/14 -- follow up visit for COPD, cough with allergies and GERD. He had a flare in May '15, was admitted. He developed URI sx about 2 weeks ago. He is now having wheeze, nasal congestion, cough with yellow mucous. He has been nyquil prn. He is on Advair; takes combivent prn and duonebs qid on a schedule. He had a lapse of smoking but quit again about 7 months  ago.  He using BiPAP qhs, ? With heated humidity.   ROV 05/01/14 -- has a hx COPD and allergies, follows for cough, allergies, developing chest tightness.  He is using Advair, appears to be interchanging the ventolin and combivent prn.  He is using fluticasone nasal spray, loratadine.  He is not using his nocturnal O2, says he needs humidity.  He stopped smoking about a year ago. Cough is productive of white / yellow.   ROV 12/11/14 -- follow-up visit for COPD, tobacco use, allergic rhinitis with associated cough. Also with chronic hypercapnic respiratory failure, has been on nocturnal O2. There was a death in his family and he started back smoking, about 5-7 a day. His breathing has worsened some since last time, ? Some influence from the weather. He is using combivent 5-6x a day on some days. Advair bid. Having poor sleep.  He snores, has poor sleep waking through the night. Remains on loratadine and fluticasone. Requesting hydrocodone for cough.   ROV 02/18/15 -- follow-up visit for tobacco use, COPD, allergic rhinitis and cough. He also has chronic hypoxemic respiratory failure. He continues to smoke about 5 cigarettes a day. He has a recent URI, has had increased cough, wheezing, nasal congestion and drainage. He is having exertional dyspnea. He is having withdrawal sx he feels, is set to go to the pain clinic per Dr Diamantina Providence prescription. He wants to get on methadone. I have told him that I am unable to fill any narcotics for him.     CAT Score 12/11/2014 03/18/2012 12/11/2011  Total CAT Score 36 33 28    Objective:   Physical Exam  Filed Vitals:   02/18/15 1630  BP: 132/82  Pulse: 98  Height: 5\' 8"  (1.727 m)  Weight: 209 lb 6.4 oz (94.983 kg)  SpO2: 96%   Gen: Pleasant, well-nourished, in no distress  ENT: No lesions,  mouth clear,  oropharynx clear, no postnasal drip,   Neck: No JVD, no TMG, no carotid bruits  Lungs: mostly clear, focal expiratory wheeze in the right upper lung  zone  Cardiovascular: RRR, heart sounds normal, no murmur or gallops, no peripheral edema  Musculoskeletal: No deformities, no cyanosis or clubbing  Neuro: alert, non focal  Skin: Warm     Assessment & Plan:  COPD (chronic obstructive pulmonary disease) With an acute exacerbation in the setting of a recent URI. He will need to be treated with antibiotics and prednisone as below.   Please continue your combivent 2 puffs four times a day  Please continue Advair twice a day. Use albuterol 2 puffs as needed for shortness of breath.  Take doxycycline 100mg  twice a day for 7 days Take prednisone  taper as directed until completely gone  You have to stop smoking. Your lung disease will continue to worsen until you are able to do this.  Follow with Dr Delton Coombes in 3 months or sooner if you have any problems.  Cervical spondylosis with myelopathy He asked for narcotics today. Note that he is planning to start going to pain clinic. I told him that I am unable to fill narcotics for him and he voiced understanding.

## 2015-03-02 ENCOUNTER — Telehealth: Payer: Self-pay | Admitting: Emergency Medicine

## 2015-03-02 NOTE — Telephone Encounter (Signed)
LMOM TCB x1  Last ov 1.26.17: Patient Instructions       Please continue your combivent 2 puffs four times a day   Please continue Advair twice a day. Use albuterol 2 puffs as needed for shortness of breath.   Take doxycycline 100mg  twice a day for 7 days Take prednisone taper as directed until completely gone   You have to stop smoking. Your lung disease will continue to worsen until you are able to do this.  Follow with Dr Delton Coombes in 3 months or sooner if you have any problems.

## 2015-03-02 NOTE — Telephone Encounter (Signed)
Pt returning call.Stanley A Dalton ° °

## 2015-03-02 NOTE — Telephone Encounter (Addendum)
Called spoke with patient who reported that he received his most recent shipment from [facility] that had his Advair and Combivent in it, but not his Ventolin.  When he called to inquire about it, he was told that his Ventolin is no longer covered by his insurance.  Pt then reported that he called [employer] and has switched his insurance from [facility] to [employer] covers all his meds.  However pt is out of his Ventolin and is requesting a PA be done to hold him until his new insurance goes into effect.  Did ask pt about the other brands of albuterol hfa but pt stated that he cannot tolerate ProAir nor Proventil and has tried these in the past.  Called [employer], PA approved for pt's Ventolin thru 12.31.17; authorization # [id number] Called spoke with patient to inform him Ventolin has been approved.  Asked pt to call his pharmacy to request refill.  Pt stated he was told that physician's office has to call. Called [facility] and spoke with Misty Stanley who reported that pt has an account balance and they would contact pt. Called spoke with patient again and informed him PPA would be in touch regarding his Ventolin.  Pt voiced his understanding.  Will sign off.

## 2015-03-31 ENCOUNTER — Telehealth: Payer: Self-pay | Admitting: Emergency Medicine

## 2015-03-31 MED ORDER — IPRATROPIUM-ALBUTEROL 0.5-2.5 (3) MG/3ML IN SOLN: RESPIRATORY_TRACT | Status: DC

## 2015-03-31 NOTE — Telephone Encounter (Signed)
Last ov with RB 02/18/15 Patient Instructions     Please continue your combivent 2 puffs four times a day  Please continue Advair twice a day. Use albuterol 2 puffs as needed for shortness of breath.  Take doxycycline 100mg  twice a day for 7 days Take prednisone taper as directed until completely gone  You have to stop smoking. Your lung disease will continue to worsen until you are able to do this.  Follow with Dr Delton Coombes in 3 months or sooner if you have any problems.    Called and spoke with pt. Informed him that we received a call stating that he wanted to switch his pharmacy to [facility]. He verified that the information was correct and was planning on informing us at his next ov. I explained to him that I would go ahead and send the rx and change his pharmacy. He voiced understanding and had no further questions. Rx sent. Nothing further needed.

## 2015-04-02 ENCOUNTER — Telehealth: Payer: Self-pay | Admitting: Emergency Medicine

## 2015-04-02 MED ORDER — IPRATROPIUM-ALBUTEROL 0.5-2.5 (3) MG/3ML IN SOLN: RESPIRATORY_TRACT | Status: DC

## 2015-04-02 NOTE — Telephone Encounter (Signed)
Called spoke with pt. He is requesting his duoneb RX be sent to [facility]. I have done so. Nothing further needed

## 2015-05-04 ENCOUNTER — Telehealth: Payer: Self-pay | Admitting: Emergency Medicine

## 2015-05-04 NOTE — Telephone Encounter (Signed)
Called and spoke to the pt. He states that he needs a letter stating that he needs his oxygen at night to sleep and that it is a medical necessity. He states that [place] currently [place] in section 8 housing and that his is in a 2 bedroom apartment now but they are trying to downgrade him to a 1 bedroom. He explained that due to the oxygen his wife is unable to sleep in the same bed as him and so they sleep in separate beds. Therefore a 1 bedroom apartment does not accommodate their needs.  I explained to him that I can have the letter ready for pick up. He voiced understanding and had no further questions. The letter has been placed at the front and ready for pick up. Nothing further needed.

## 2015-05-13 ENCOUNTER — Encounter: Payer: Self-pay | Admitting: Emergency Medicine

## 2015-05-13 ENCOUNTER — Ambulatory Visit (INDEPENDENT_AMBULATORY_CARE_PROVIDER_SITE_OTHER): Payer: Medicare HMO | Admitting: Emergency Medicine

## 2015-05-13 VITALS — BP 148/88 | HR 107 | Ht 68.0 in | Wt 203.0 lb

## 2015-05-13 DIAGNOSIS — J449 Chronic obstructive pulmonary disease, unspecified: Secondary | ICD-10-CM | POA: Diagnosis not present

## 2015-05-13 DIAGNOSIS — F17200 Nicotine dependence, unspecified, uncomplicated: Secondary | ICD-10-CM | POA: Diagnosis not present

## 2015-05-13 NOTE — Patient Instructions (Addendum)
Please continue your inhaled medications a you have been using them  Continue to work hard on stopping smoking. The next task for you is to set a quit date and to prepare for this date by getting rid of your cigarettes, ashtrays, lighters. Also discuss with anyone that you around that they should support your effort, not smoke around you.  Follow with Dr Delton Coombes in 4 months or sooner if you have any problems.

## 2015-05-13 NOTE — Progress Notes (Signed)
Subjective:    Patient ID: Scott [name], male    DOB: [birth year], 55 y.o.   MRN: [id number]  HPI 55 yo man, tobacco user (smoking 1/2 pk/day), hx of COPD in a study at [facility]. Has had PFT at [facility] and ? At [facility] as well. Now following with Dr Reche Dixon at [facility]. He has been having increased combivent use for SOB. Coughs every day, has been worse since anterior cervical fusion in November. Sputum is clear to yellow.  He is also on Advair.  He has bad GERD, better w omeprazole. Has allergy sx, on no meds.    ROV 01/08/14 -- follow up visit for COPD, cough with allergies and GERD. He had a flare in May '15, was admitted. He developed URI sx about 2 weeks ago. He is now having wheeze, nasal congestion, cough with yellow mucous. He has been nyquil prn. He is on Advair; takes combivent prn and duonebs qid on a schedule. He had a lapse of smoking but quit again about 7 months ago.  He using BiPAP qhs, ? With heated humidity.   ROV 05/01/14 -- has a hx COPD and allergies, follows for cough, allergies, developing chest tightness.  He is using Advair, appears to be interchanging the ventolin and combivent prn.  He is using fluticasone nasal spray, loratadine.  He is not using his nocturnal O2, says he needs humidity.  He stopped smoking about a year ago. Cough is productive of white / yellow.   ROV 12/11/14 -- follow-up visit for COPD, tobacco use, allergic rhinitis with associated cough. Also with chronic hypercapnic respiratory failure, has been on nocturnal O2. There was a death in his family and he started back smoking, about 5-7 a day. His breathing has worsened some since last time, ? Some influence from the weather. He is using combivent 5-6x a day on some days. Advair bid. Having poor sleep.  He snores, has poor sleep waking through the night. Remains on loratadine and fluticasone. Requesting hydrocodone for cough.   ROV 02/18/15 -- follow-up visit for tobacco use, COPD, allergic rhinitis and cough.  He also has chronic hypoxemic respiratory failure. He continues to smoke about 5 cigarettes a day. He has a recent URI, has had increased cough, wheezing, nasal congestion and drainage. He is having exertional dyspnea. He is having withdrawal sx he feels, is set to go to the pain clinic per Dr Diamantina Providence prescription. He wants to get on methadone. I have told him that I am unable to fill any narcotics for him.   ROC 05/13/15 -- patient with a history of COPD, continued tobacco use, allergic rhinitis associated with cough. Chronic hypoxemic respiratory failure. He states that he has cut down his cigarettes from 5 to 1 cigarette daily. He had his R chest sebaceus cyst removed. He tells me that his breathing has been doing fair;ly well. His insurance just changed to [facility], will need to change from [employer] to another DME. Wears O2 at night and with exertion 3L/min.  No pred or abx since last time we prescribed 12/16. He states that we will get a letter from [employer] regarding his medical conditions and the need for him to have two bedrooms (he and his wife sleep in separate rooms)  His SABA use has been higher this Spring.     CAT Score 12/11/2014 03/18/2012 12/11/2011  Total CAT Score 36 33 28    Objective:   Physical Exam  Filed Vitals:   05/13/15 0903  BP: 148/88  Pulse: 107  Height: 5\' 8"  (1.727 m)  Weight: 203 lb (92.08 kg)  SpO2: 95%   Gen: Pleasant, well-nourished, in no distress  ENT: No lesions,  mouth clear,  oropharynx clear, no postnasal drip,   Neck: No JVD, no TMG, no carotid bruits  Lungs: mostly clear, B soft end exp wheezes.   Cardiovascular: RRR, heart sounds normal, no murmur or gallops, no peripheral edema  Musculoskeletal: No deformities, no cyanosis or clubbing  Neuro: alert, non focal  Skin: Warm     Assessment & Plan:  COPD (chronic obstructive pulmonary disease) Please continue your inhaled medications a you have been using them  Follow with Dr Delton Coombes in 4 months or  sooner if you have any problems. We will change his o2 and neb dme due to insurance change   Tobacco use disorder Continue to work hard on stopping smoking. The next task for you is to set a quit date and to prepare for this date by getting rid of your cigarettes, ashtrays, lighters. Also discuss with anyone that you around that they should support your effort, not smoke around you.

## 2015-05-13 NOTE — Assessment & Plan Note (Signed)
Please continue your inhaled medications a you have been using them  Follow with Dr Delton Coombes in 4 months or sooner if you have any problems. We will change his o2 and neb dme due to insurance change

## 2015-05-13 NOTE — Assessment & Plan Note (Signed)
Continue to work hard on stopping smoking. The next task for you is to set a quit date and to prepare for this date by getting rid of your cigarettes, ashtrays, lighters. Also discuss with anyone that you around that they should support your effort, not smoke around you.

## 2015-05-17 ENCOUNTER — Telehealth: Payer: Self-pay | Admitting: Emergency Medicine

## 2015-05-17 NOTE — Telephone Encounter (Signed)
LMTCB  Per chart notes new DME is [employer].

## 2015-05-18 NOTE — Telephone Encounter (Signed)
Spoke with pt, states Christoper Allegra is currently at his home with 02, nothing further needed.  Will close encounter.

## 2015-06-18 ENCOUNTER — Other Ambulatory Visit: Payer: Self-pay | Admitting: Emergency Medicine

## 2015-08-24 ENCOUNTER — Emergency Department (HOSPITAL_COMMUNITY)
Admission: EM | Admit: 2015-08-24 | Discharge: 2015-08-24 | Disposition: A | Discharge status: Home / Self Care | Payer: Medicare HMO | Attending: Emergency Medicine | Admitting: Emergency Medicine

## 2015-08-24 ENCOUNTER — Encounter (HOSPITAL_COMMUNITY): Payer: Self-pay | Admitting: *Deleted

## 2015-08-24 DIAGNOSIS — F17210 Nicotine dependence, cigarettes, uncomplicated: Secondary | ICD-10-CM | POA: Insufficient documentation

## 2015-08-24 DIAGNOSIS — E119 Type 2 diabetes mellitus without complications: Secondary | ICD-10-CM | POA: Diagnosis not present

## 2015-08-24 DIAGNOSIS — Z79899 Other long term (current) drug therapy: Secondary | ICD-10-CM | POA: Diagnosis not present

## 2015-08-24 DIAGNOSIS — Z760 Encounter for issue of repeat prescription: Secondary | ICD-10-CM | POA: Diagnosis present

## 2015-08-24 DIAGNOSIS — J449 Chronic obstructive pulmonary disease, unspecified: Secondary | ICD-10-CM | POA: Diagnosis not present

## 2015-08-24 MED ORDER — ALBUTEROL SULFATE HFA 108 (90 BASE) MCG/ACT IN AERS
1.0000 | INHALATION_SPRAY | RESPIRATORY_TRACT | Status: DC | PRN
  Administered 2015-08-24: 2 via RESPIRATORY_TRACT
  Filled 2015-08-24: qty 6.7

## 2015-08-24 NOTE — ED Triage Notes (Signed)
Pt with COPD is here for albuterol inhaler. Pt states he cannot see his doctor until next Monday and used his inhaler's last puff today.

## 2015-08-24 NOTE — ED Provider Notes (Signed)
[facility]-EMERGENCY DEPT Provider Note   CSN: [id number] Arrival date & time: 08/24/15  1202  First Provider Contact:  First MD Initiated Contact with Patient 08/24/15 1233     By signing my name below, I, Scott [name], attest that this documentation has been prepared under the direction and in the presence of non-physician practitioner, Scott Fowler, PA-C. Electronically Signed: Majel [name], Scribe. 08/24/2015. 12:43 PM.  History   Chief Complaint Chief Complaint  Patient presents with  . Medication Refill   The history is provided by the patient. No language interpreter was used.   HPI Comments: Scott [name] is a 55 y.o. male with PMHx of COPD and DM, who presents to the Emergency Department for a medication refill of his albuterol inhaler. Pt reports he used his last "puff" of his inhaler today; he states he used most of it this past weekend while moving heavy objects out of his house. Pt notes he is expected to fill his prescription of albuterol on 08/30/15 but needs a refill to hold him over until then. He denies any new or worsening shortness of breath, wheezing and symptoms of COPD exacerbation.   Past Medical History:  Diagnosis Date  . 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 COPD (chronic obstructive pulmonary disease) (HCC)   . Diabetes mellitus without complication (HCC)   . GERD (gastroesophageal reflux disease)   . Headache(784.0)   . Pneumonia    hx    Patient Active Problem List   Diagnosis Date Noted  . Tobacco use disorder 05/13/2015  . Sebaceous cyst 01/08/2014  . Hypoxia 06/01/2013  . Acute bronchitis 06/01/2013  . Diabetes mellitus without complication (HCC)   . GERD (gastroesophageal reflux disease)   . Cervical post-laminectomy syndrome 10/02/2012  . Degeneration of lumbar or lumbosacral intervertebral disc 06/01/2011  . COPD (chronic obstructive pulmonary disease) (HCC) 05/30/2011  . Hyperlipidemia 05/30/2011  . Allergic rhinitis 05/30/2011    . Cervical spondylosis with myelopathy 12/22/2010    Past Surgical History:  Procedure Laterality Date  . ANTERIOR CERVICAL DECOMP/DISCECTOMY FUSION  12/22/2010   Procedure: ANTERIOR CERVICAL DECOMPRESSION/DISCECTOMY FUSION 3 LEVELS;  Surgeon: Cristi Loron;  Location: [facility];  Service: Neurosurgery;  Laterality: N/A;  Anterior cervical discectomy with fusion cervical three-four, four-five, and five sixCDF with Interbody Prosthesis, plating, and Bone Graft   . ANTERIOR CERVICAL DECOMP/DISCECTOMY FUSION N/A 10/16/2012   Procedure: CERVICAL SIX-SEVEN ANTERIOR CERVICAL DECOMPRESSION/DISCECTOMY FUSION WITH INTERBODY PROTHESIS PLATING BONEGRAFT WITH /POSSIBLE HARDWARE REMOVAL OLD PLATE;  Surgeon: Cristi Loron, MD;  Location: [facility];  Service: Neurosurgery;  Laterality: N/A;  . MULTIPLE TOOTH EXTRACTIONS    . OTHER SURGICAL HISTORY     surgery on cheekbone and head for fall 2000  . 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 TONSILLECTOMY      Home Medications    Prior to Admission medications   Medication Sig Start Date End Date Taking? Authorizing Provider  ADVAIR DISKUS 250-50 MCG/DOSE AEPB INHALE 1 PUFF BY MOUTH TWICE DAILY 06/22/15   Leslye Peer, MD  COMBIVENT RESPIMAT 20-100 MCG/ACT AERS respimat TAKE 1 PUFF BY MOUTH INTO THE LUNGS FOUR TIMES A DAY 06/22/15   Leslye Peer, MD  diazepam (VALIUM) 5 MG tablet Take 1 tablet (5 mg total) by mouth every 6 (six) hours  as needed for anxiety. Patient taking differently: Take 5 mg by mouth every 12 (twelve) hours as needed for anxiety.  04/08/13   Erick Colace, MD  doxycycline (VIBRA-TABS) 100 MG tablet Take 1 tablet (100 mg total) by mouth 2 (two) times daily. 02/18/15   Leslye Peer, MD  fluticasone (FLONASE) 50 MCG/ACT nasal spray INHALE 2 SPRAYS INTO BOTH NOSTRILS TWICE DAILY**NEED TO MAKE AN APPOINTMENT FOR REFILLS**  01/04/15   Leslye Peer, MD  ipratropium-albuterol (DUONEB) 0.5-2.5 (3) MG/3ML SOLN Use 3ml via nebulizer every 6 hours as directed by physician for shortness of breath, wheezing. DX code J44.9 04/02/15   Leslye Peer, MD  loratadine (CLARITIN) 10 MG tablet Take 10 mg by mouth daily.    Historical Provider, MD  omeprazole (PRILOSEC) 40 MG capsule TAKE 1 CAPSULE BY MOUTH EVERY DAY 06/22/15   Leslye Peer, MD  oxyCODONE-acetaminophen (PERCOCET) 10-325 MG per tablet Take 1 tablet by mouth every 8 (eight) hours as needed for pain. 06/03/13   Jeralyn Bennett, MD  predniSONE (DELTASONE) 10 MG tablet Take 4 tablets for 3 days, 3 tablets for 3 days, 2 tablets for 3 days, 1 tablet for 3 days 02/18/15   Leslye Peer, MD  rosuvastatin (CRESTOR) 10 MG tablet Take 10 mg by mouth daily.    Historical Provider, MD  sitaGLIPtin-metformin (JANUMET) 50-500 MG per tablet Take 1 tablet by mouth daily.    Historical Provider, MD  VENTOLIN HFA 108 (90 Base) MCG/ACT inhaler INHALE 1 TO 2 PUFFS BY MOUTH EVERY 6 HOURS AS NEEDED FOR WHEEZING OR SHORTNESS OF BREATH 06/22/15   Leslye Peer, MD    Family History Family History  Problem Relation Age of Onset  . Emphysema Mother   . COPD Mother   . Asthma Brother   . Asthma Sister   . Heart disease Maternal Uncle   . Hypertension Brother     Social History Social History  Substance Use Topics  . Smoking status: Current Every Day Smoker    Packs/day: 0.25    Years: 41.00    Types: Cigarettes    Start date: 03/17/2012    Last attempt to quit: 06/07/2012  . Smokeless tobacco: Never Used  . Alcohol use No     Allergies   Penicillins   Review of Systems Review of Systems  Respiratory: Negative for cough, shortness of breath and wheezing.   All other systems reviewed and are negative.  Physical Exam Updated Vital Signs BP 152/97 (BP Location: Right Arm)   Pulse 118   Temp 98.5 F (36.9 C) (Oral)   Resp 16   Ht  (1.727 m)   Wt 198 lb (89.8 kg)    SpO2 92%   BMI 30.11 kg/m   Physical Exam  Constitutional: He is oriented to person, place, and time. He appears well-developed and well-nourished.  HENT:  Head: Normocephalic and atraumatic.  Right Ear: External ear normal.  Left Ear: External ear normal.  Eyes: Conjunctivae are normal. No scleral icterus.  Neck: No tracheal deviation present.  Cardiovascular: Regular rhythm and normal heart sounds.   Pulmonary/Chest: Effort normal. No respiratory distress. He has wheezes (faint expiratory). He has no rales.  Abdominal: Soft. Bowel sounds are normal. He exhibits no distension.  Musculoskeletal: Normal range of motion.  Neurological: He is alert and oriented to person, place, and time.  Skin: Skin is warm and dry.  Psychiatric: He has a normal mood and affect. His behavior is normal.  ED Treatments / Results  Labs (all labs ordered are listed, but only abnormal results are displayed) Labs Reviewed - No data to display  EKG  EKG Interpretation None       Radiology No results found.  Procedures Procedures  DIAGNOSTIC STUDIES:  Oxygen Saturation is 92% on RA, low by my interpretation.    COORDINATION OF CARE:  12:41 PM Discussed treatment plan, which includes albuterol with pt at bedside and pt agreed to plan.  Medications Ordered in ED Medications  albuterol (PROVENTIL HFA;VENTOLIN HFA) 108 (90 Base) MCG/ACT inhaler 1-2 puff (2 puffs Inhalation Given 08/24/15 1244)   Initial Impression / Assessment and Plan / ED Course  I have reviewed the triage vital signs and the nursing notes.  Pertinent labs & imaging results that were available during my care of the patient were reviewed by me and considered in my medical decision making (see chart for details).  Clinical Course   Patient presents for medication refill of albuterol inhaler.  Denies CP, new SOB, increased cough/sputum production, fever, chills.  VSS, NAD.  Albuterol refilled in ED.  Patient has follow up  appointment on Monday.  Return precautions discussed.  Patient agrees and acknowledges the above plan for discharge.   Final Clinical Impressions(s) / ED Diagnoses   Final diagnoses:  Medication refill    New Prescriptions New Prescriptions   No medications on file   I personally performed the services described in this documentation, which was scribed in my presence. The recorded information has been reviewed and is accurate.     Scott Fowler, PA-C 08/24/15 1256    Alvira Monday, MD 08/27/15 1154

## 2015-09-13 ENCOUNTER — Ambulatory Visit (INDEPENDENT_AMBULATORY_CARE_PROVIDER_SITE_OTHER): Payer: Medicare HMO | Admitting: Emergency Medicine

## 2015-09-13 ENCOUNTER — Encounter: Payer: Self-pay | Admitting: Emergency Medicine

## 2015-09-13 DIAGNOSIS — J449 Chronic obstructive pulmonary disease, unspecified: Secondary | ICD-10-CM | POA: Diagnosis not present

## 2015-09-13 DIAGNOSIS — F17200 Nicotine dependence, unspecified, uncomplicated: Secondary | ICD-10-CM

## 2015-09-13 MED ORDER — BUPROPION HCL ER (SR) 150 MG PO TB12: ORAL_TABLET | ORAL | 0 refills | Status: DC

## 2015-09-13 MED ORDER — HYDROCODONE-HOMATROPINE 5-1.5 MG/5ML PO SYRP: 5.0000 mL | ORAL_SOLUTION | Freq: Four times a day (QID) | ORAL | 0 refills | Status: DC | PRN

## 2015-09-13 NOTE — Patient Instructions (Signed)
Please continue Flonase nose spray, 2 sprays each nostril daily Take Combivent 4 times a day Use Advair 2 times a day We will start Wellbutrin. Start the medication approximately 1 weeks before your set quit date . Prior to her quit date should get rid of all your cigarettes, lighter's, Ash trays, matches. Try calling 1-800-QUIT-NOW for support. Take Hycodan up to every 6 hours as needed for her cough Follow with Dr Delton Coombes in 6 months or sooner if you have any problems

## 2015-09-13 NOTE — Assessment & Plan Note (Signed)
Discussed smoking cessation in detail today. I believe these really set quit date. We will try starting Wellbutrin with a starter pack and then sustained therapy until our next visit.

## 2015-09-13 NOTE — Progress Notes (Signed)
Subjective:    Patient ID: Scott [name], male    DOB: 23-Sep-1960, 55 y.o.   MRN: [id number]  HPI 55 yo man, tobacco user (smoking 1/2 pk/day), [facility]. [facility] and ? [facility] as [facility]. Now following with [facility] Rec[facility]onalbot [facility]. He has been having increased combivent use for SOB. Coughs every day, has been worse since anterior cervical fusion in November. Sputum is clear to yellow.  He is also on Advair.  He has bad GERD, better w omeprazole. Has allergy sx, on no meds.    ROV 01/08/14 -- follow up visit for COPD, cough with allergies and GERD. He had a flare in May '15, was admitted. He developed URI sx about 2 weeks ago. He is now having wheeze, nasal congestion, cough with yellow mucous. He has been nyquil prn. He is on Advair; takes combivent prn and duonebs qid on a schedule. He had a lapse of smoking but quit again about 7 months ago.  He using BiPAP qhs, ? With heated humidity.   ROV 05/01/14 -- has a hx COPD and allergies, follows for cough, allergies, developing chest tightness.  He is using Advair, appears to be interchanging the ventolin and combivent prn.  He is using fluticasone nasal spray, loratadine.  He is not using his nocturnal O2, says he needs humidity.  He stopped smoking about a year ago. Cough is productive of white / yellow.   ROV 12/11/14 -- follow-up visit for COPD, tobacco use, allergic rhinitis with associated cough. Also with chronic hypercapnic respiratory failure, has been on nocturnal O2. There was a death in his family and he started back smoking, about 5-7 a day. His breathing has worsened some since last time, ? Some influence from the weather. He is using combivent 5-6x a day on some days. Advair bid. Having poor sleep.  He snores, has poor sleep waking through the night. Remains on loratadine and fluticasone. Requesting hydrocodone for cough.   ROV 02/18/15 -- follow-up visit for tobacco use, COPD, allergic rhinitis and cough.  He also has chronic hypoxemic respiratory failure. He continues to smoke about 5 cigarettes a day. He has a recent URI, has had increased cough, wheezing, nasal congestion and drainage. He is having exertional dyspnea. He is having withdrawal sx he feels, is set to go to the pain clinic per Dr Diamantina Providenceean's prescription. He wants to get on methadone. I have told him that I am unable to fill any narcotics for him.   ROC 05/13/15 -- patient with a history of COPD, continued tobacco use, allergic rhinitis associated with cough. Chronic hypoxemic respiratory failure. He states that he has cut down his cigarettes from 5 to 1 cigarette daily. He had his R chest sebaceus cyst removed. He tells me that his breathing has been doing fair;ly well. His insurance just changed to [employer], will need to change from [employer] to another DME. Wears O2 at night and with exertion 3L/min.  No pred or abx since last time we prescribed 12/16. He states that we will get a letter from [employer] regarding his medical conditions and the need for him to have two bedrooms (he and his wife sleep in separate rooms)  His SABA use has been higher this Spring.   ROV 09/13/15 -- follow up visit for tobacco use, COPD, Exertional hypoxemia. He is smoking 1-2 cig a day. He wants to quit altogether, has tried patches before, may want to try  try gum. He is fearful of Chantix side effects. His breathing has been about the same. He coughs up mucous daily. Has nasal congestion. He is on Advair + scheduled combivent. flonase qd. Was treated with abx for a sinusitis since last time, no pred given. Requests cough syrup  CAT Score 12/11/2014 03/18/2012 12/11/2011  Total CAT Score 36 33 28    Objective:   Physical Exam  Vitals:   09/13/15 0901  BP: 122/82  BP Location: Right Arm  Cuff Size: Normal  Pulse: 85  SpO2: 93%  Weight: 202 lb (91.6 kg)  Height: 5\' 8"  (1.727 m)   Gen: Pleasant, well-nourished, in no distress  ENT: No lesions,  mouth clear,   oropharynx clear, no postnasal drip,   Neck: No JVD, no TMG, no carotid bruits  Lungs: mostly clear, B soft end exp wheezes.   Cardiovascular: RRR, heart sounds normal, no murmur or gallops, no peripheral edema  Musculoskeletal: No deformities, no cyanosis or clubbing  Neuro: alert, non focal  Skin: Warm     Assessment & Plan:  COPD (chronic obstructive pulmonary disease) Please continue Flonase nose spray, 2 sprays each nostril daily Take Combivent 4 times a day Use Advair 2 times a day We will start Wellbutrin. Start the medication approximately 1 weeks before your set quit date . Prior to her quit date should get rid of all your cigarettes, lighter's, Ash trays, matches. Try calling 1-800-QUIT-NOW for support. Take Hycodan up to every 6 hours as needed for her cough Follow with Dr Delton Coombes in 6 months or sooner if you have any problems  Tobacco use disorder Discussed smoking cessation in detail today. I believe these really set quit date. We will try starting Wellbutrin with a starter pack and then sustained therapy until our next visit.   Levy Pupa, MD, PhD 09/13/2015, 9:21 AM [facility] [phone] or if no answer [phone]

## 2015-09-13 NOTE — Assessment & Plan Note (Signed)
Please continue Flonase nose spray, 2 sprays each nostril daily Take Combivent 4 times a day Use Advair 2 times a day We will start Wellbutrin. Start the medication approximately 1 weeks before your set quit date . Prior to her quit date should get rid of all your cigarettes, lighter's, Ash trays, matches. Try calling 1-800-QUIT-NOW for support. Take Hycodan up to every 6 hours as needed for her cough Follow with Dr Byrum in 6 months or sooner if you have any problems  

## 2015-09-23 ENCOUNTER — Other Ambulatory Visit: Payer: Self-pay | Admitting: Emergency Medicine

## 2015-10-13 ENCOUNTER — Emergency Department (HOSPITAL_COMMUNITY)
Admission: EM | Admit: 2015-10-13 | Discharge: 2015-10-13 | Discharge status: Against Medical Advice | Payer: Medicare HMO

## 2015-10-13 NOTE — ED Triage Notes (Signed)
Pt reports unable to stay to be seen, must pick up grandchild. Informed pt of risks of leaving, reports be will return if needed or go to ucc later today. No acute distress was noted in triage area.

## 2015-11-02 ENCOUNTER — Other Ambulatory Visit: Payer: Self-pay | Admitting: Emergency Medicine

## 2015-11-04 ENCOUNTER — Telehealth: Payer: Self-pay | Admitting: Emergency Medicine

## 2015-11-04 ENCOUNTER — Other Ambulatory Visit: Payer: Self-pay | Admitting: *Deleted

## 2015-11-04 MED ORDER — OMEPRAZOLE 40 MG PO CPDR: DELAYED_RELEASE_CAPSULE | ORAL | 6 refills | Status: DC

## 2015-11-04 NOTE — Telephone Encounter (Signed)
Patient calling to get refill on Omeprazole.  Rx sent to pharmacy. Patient aware. Nothing further needed.

## 2015-12-15 ENCOUNTER — Other Ambulatory Visit: Payer: Self-pay | Admitting: Emergency Medicine

## 2016-02-07 ENCOUNTER — Telehealth: Payer: Self-pay | Admitting: Emergency Medicine

## 2016-02-07 MED ORDER — FLUTICASONE-SALMETEROL 250-50 MCG/DOSE IN AEPB: 1.0000 | INHALATION_SPRAY | Freq: Two times a day (BID) | RESPIRATORY_TRACT | 1 refills | Status: DC

## 2016-02-07 NOTE — Telephone Encounter (Signed)
rx for advair sent to pharm

## 2016-02-14 ENCOUNTER — Other Ambulatory Visit: Payer: Self-pay | Admitting: Emergency Medicine

## 2016-03-09 ENCOUNTER — Encounter: Payer: Self-pay | Admitting: Emergency Medicine

## 2016-03-09 ENCOUNTER — Ambulatory Visit (INDEPENDENT_AMBULATORY_CARE_PROVIDER_SITE_OTHER): Payer: Medicare HMO | Admitting: Emergency Medicine

## 2016-03-09 ENCOUNTER — Other Ambulatory Visit: Payer: Self-pay | Admitting: Emergency Medicine

## 2016-03-09 DIAGNOSIS — J449 Chronic obstructive pulmonary disease, unspecified: Secondary | ICD-10-CM | POA: Diagnosis not present

## 2016-03-09 NOTE — Addendum Note (Signed)
Addended by: Cydney Ok on: 03/09/2016 09:27 AM   Modules accepted: Orders

## 2016-03-09 NOTE — Patient Instructions (Addendum)
Please continue your combivent 4 times a day.  Continue your Advair twice a day.  Congratulations on stopping smoking  We will consider changing your inhalers in the future depending on how you are doing.  We will continue your wellbutrin We may decide to pursue pulmonary rehab at some point in the future.  We will repeat your pulmonary function testing at your next visit.  Follow with Dr Skylor Schnapp in 3 months with full PFT on the same day.  

## 2016-03-09 NOTE — Progress Notes (Signed)
  Subjective:    Patient ID: Scott [name], male    DOB: 1960-12-15, 56 y.o.   MRN: [id number]  HPI  ROV 09/13/15 -- follow up visit for tobacco use, COPD, Exertional hypoxemia. He is smoking 1-2 cig a day. He wants to quit altogether, has tried patches before, may want to try try gum. He is fearful of Chantix side effects. His breathing has been about the same. He coughs up mucous daily. Has nasal congestion. He is on Advair + scheduled combivent. flonase qd. Was treated with abx for a sinusitis since last time, no pred given. Requests cough syrup  ROV 03/09/16 -- Scott [name] has a history of COPD, exertional hypoxemia and tobacco - he reports that he stopped smoking on 01/22/16. He reports that he has had more trouble since our last visit. More exertional SOB, chest tightness. He does not exert much - can exert for about 10 minutes. Has not been rx with pred or abx since last time. He is still on wellbutrin, wants to continue it for mood reasons. He uses ventolin 4-5x a day.     No flowsheet data found.  Objective:   Physical Exam  Vitals:   03/09/16 0852  BP: 128/70  BP Location: Left Arm  Cuff Size: Normal  Pulse: 100  SpO2: 91%  Weight: 206 lb 3.2 oz (93.5 kg)  Height: 5\' 8"  (1.727 m)   Gen: Pleasant, well-nourished, in no distress  ENT: No lesions,  mouth clear,  oropharynx clear, no postnasal drip,   Neck: No JVD, no TMG, no carotid bruits  Lungs: mostly clear, B soft end exp wheezes, no rhonchi.   Cardiovascular: RRR, heart sounds normal, no murmur or gallops, no peripheral edema  Musculoskeletal: No deformities, no cyanosis or clubbing  Neuro: alert, appears to be more sedate today, less energy. Non-focal.   Skin: Warm     Assessment & Plan:  COPD (chronic obstructive pulmonary disease) Please continue your combivent 4 times a day.  Continue your Advair twice a day.  Congratulations on stopping smoking  We will consider changing your inhalers in the future depending  on how you are doing.  We will continue your wellbutrin We may decide to pursue pulmonary rehab at some point in the future.  We will repeat your pulmonary function testing at your next visit.  Follow with Dr Scott [name] in 3 months with full PFT on the same day.    Levy Pupa, MD, PhD 03/09/2016, 9:23 AM [facility] [phone] or if no answer [phone]

## 2016-03-09 NOTE — Assessment & Plan Note (Signed)
Please continue your combivent 4 times a day.  Continue your Advair twice a day.  Congratulations on stopping smoking  We will consider changing your inhalers in the future depending on how you are doing.  We will continue your wellbutrin We may decide to pursue pulmonary rehab at some point in the future.  We will repeat your pulmonary function testing at your next visit.  Follow with Dr Delton Coombes in 3 months with full PFT on the same day.

## 2016-05-29 ENCOUNTER — Telehealth: Payer: Self-pay | Admitting: Emergency Medicine

## 2016-05-29 MED ORDER — OMEPRAZOLE 40 MG PO CPDR: DELAYED_RELEASE_CAPSULE | ORAL | 6 refills | Status: DC

## 2016-05-29 MED ORDER — IPRATROPIUM-ALBUTEROL 0.5-2.5 (3) MG/3ML IN SOLN: RESPIRATORY_TRACT | 3 refills | Status: DC

## 2016-05-29 MED ORDER — IPRATROPIUM-ALBUTEROL 20-100 MCG/ACT IN AERS: INHALATION_SPRAY | RESPIRATORY_TRACT | 5 refills | Status: DC

## 2016-05-29 MED ORDER — FLUTICASONE-SALMETEROL 250-50 MCG/DOSE IN AEPB: INHALATION_SPRAY | RESPIRATORY_TRACT | 5 refills | Status: DC

## 2016-05-29 MED ORDER — VENTOLIN HFA 108 (90 BASE) MCG/ACT IN AERS: INHALATION_SPRAY | RESPIRATORY_TRACT | 5 refills | Status: DC

## 2016-05-29 NOTE — Telephone Encounter (Signed)
Requests all of these be sent over please.
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
## 2016-05-30 MED ORDER — PROMETHAZINE-CODEINE 6.25-10 MG/5ML PO SYRP: 5.0000 mL | ORAL_SOLUTION | Freq: Four times a day (QID) | ORAL | 0 refills | Status: DC | PRN

## 2016-05-30 MED ORDER — BUPROPION HCL ER (SR) 150 MG PO TB12: 150.0000 mg | ORAL_TABLET | Freq: Two times a day (BID) | ORAL | 1 refills | Status: DC

## 2016-05-30 NOTE — Telephone Encounter (Signed)
Yes I am OK refilling both with a single refill.

## 2016-05-30 NOTE — Telephone Encounter (Signed)
rx's sent to preferred pharmacy.  Pt aware of refills.  Nothing further needed.

## 2016-06-09 ENCOUNTER — Ambulatory Visit: Payer: Medicare HMO | Admitting: Emergency Medicine

## 2016-07-10 ENCOUNTER — Other Ambulatory Visit: Payer: Self-pay | Admitting: Emergency Medicine

## 2016-07-21 ENCOUNTER — Ambulatory Visit: Payer: Medicare HMO | Admitting: Emergency Medicine

## 2016-08-16 ENCOUNTER — Other Ambulatory Visit: Payer: Self-pay | Admitting: Emergency Medicine

## 2016-10-03 ENCOUNTER — Ambulatory Visit: Payer: Medicare HMO | Admitting: Emergency Medicine

## 2016-10-11 ENCOUNTER — Other Ambulatory Visit: Payer: Self-pay | Admitting: Emergency Medicine

## 2016-11-02 ENCOUNTER — Ambulatory Visit: Payer: Medicare HMO | Admitting: Emergency Medicine

## 2016-11-08 ENCOUNTER — Other Ambulatory Visit: Payer: Self-pay | Admitting: Emergency Medicine

## 2016-11-17 ENCOUNTER — Encounter: Payer: Self-pay | Admitting: Emergency Medicine

## 2016-11-17 ENCOUNTER — Ambulatory Visit (INDEPENDENT_AMBULATORY_CARE_PROVIDER_SITE_OTHER): Payer: Medicare HMO | Admitting: Emergency Medicine

## 2016-11-17 VITALS — BP 130/64 | HR 114 | Ht 68.5 in | Wt 210.8 lb

## 2016-11-17 DIAGNOSIS — F17200 Nicotine dependence, unspecified, uncomplicated: Secondary | ICD-10-CM | POA: Diagnosis not present

## 2016-11-17 DIAGNOSIS — J449 Chronic obstructive pulmonary disease, unspecified: Secondary | ICD-10-CM

## 2016-11-17 DIAGNOSIS — K130 Diseases of lips: Secondary | ICD-10-CM

## 2016-11-17 LAB — PULMONARY FUNCTION TEST
DL/VA % pred: 69 %
DL/VA: 3.15 ml/min/mmHg/L
DLCO cor % pred: 59 %
DLCO cor: 17.9 ml/min/mmHg
DLCO unc % pred: 61 %
DLCO unc: 18.7 ml/min/mmHg
FEF 25-75 Post: 0.66 L/sec
FEF 25-75 Pre: 0.56 L/sec
FEF2575-%Change-Post: 18 %
FEF2575-%Pred-Post: 22 %
FEF2575-%Pred-Pre: 19 %
FEV1-%Change-Post: 12 %
FEV1-%Pred-Post: 45 %
FEV1-%Pred-Pre: 40 %
FEV1-Post: 1.39 L
FEV1-Pre: 1.24 L
FEV1FVC-%Change-Post: 5 %
FEV1FVC-%Pred-Pre: 54 %
FEV6-%Change-Post: 3 %
FEV6-%Pred-Post: 78 %
FEV6-%Pred-Pre: 76 %
FEV6-Post: 2.95 L
FEV6-Pre: 2.86 L
FEV6FVC-%Change-Post: -2 %
FEV6FVC-%Pred-Post: 99 %
FEV6FVC-%Pred-Pre: 102 %
FVC-%Change-Post: 6 %
FVC-%Pred-Post: 78 %
FVC-%Pred-Pre: 74 %
FVC-Post: 3.06 L
FVC-Pre: 2.87 L
Post FEV1/FVC ratio: 45 %
Post FEV6/FVC ratio: 97 %
Pre FEV1/FVC ratio: 43 %
Pre FEV6/FVC Ratio: 100 %

## 2016-11-17 MED ORDER — FLUTICASONE-UMECLIDIN-VILANT 100-62.5-25 MCG/INH IN AEPB: 1.0000 | INHALATION_SPRAY | Freq: Every day | RESPIRATORY_TRACT | 0 refills | Status: DC

## 2016-11-17 MED ORDER — IPRATROPIUM-ALBUTEROL 20-100 MCG/ACT IN AERS: INHALATION_SPRAY | RESPIRATORY_TRACT | 5 refills | Status: DC

## 2016-11-17 NOTE — Addendum Note (Signed)
Addended by: Cydney Ok on: 11/17/2016 12:32 PM   Modules accepted: Orders

## 2016-11-17 NOTE — Patient Instructions (Addendum)
Get the flu shot this Fall.  We will perform walking oximetry today on room air.  We will stop Combivent and Advair for now.  Start taking Trelegy one inhalation once a day You may use ventolin 2 puffs up to every 4 hours if needed for shortness of breath.  You would benefit from being evaluated by ENT to check the lesion on your lower lip, the hoarseness in your throat. Please talk to Dr August Saucer about this.  Follow with Dr Delton Coombes in 4 months or sooner if you have any problems.

## 2016-11-17 NOTE — Assessment & Plan Note (Signed)
Severe obstruction.  Stable testing compared with his most recent.  Question whether he has exertional hypoxemia based on his description of his symptoms.  He is clearly overusing his short acting bronchodilator, discussed appropriate dosing with him today.  We will do a trial of Trelegy, try to replace his Combivent and Advair.  Will call us and let us know if the Trelegy is superior.  If so then we will order through his pharmacy.

## 2016-11-17 NOTE — Assessment & Plan Note (Signed)
Reinforced his cessation.  Congratulated him for quitting.

## 2016-11-17 NOTE — Patient Instructions (Signed)
PFT done today. 

## 2016-11-17 NOTE — Addendum Note (Signed)
Addended by: Jaynee Eagles C on: 11/17/2016 11:54 AM   Modules accepted: Orders

## 2016-11-17 NOTE — Progress Notes (Signed)
  Subjective:    Patient ID: Scott [name], male    DOB: 05-14-1960, 56 y.o.   MRN: [id number]  HPI  ROV 09/13/15 -- follow up visit for tobacco use, COPD, Exertional hypoxemia. He is smoking 1-2 cig a day. He wants to quit altogether, has tried patches before, may want to try try gum. He is fearful of Chantix side effects. His breathing has been about the same. He coughs up mucous daily. Has nasal congestion. He is on Advair + scheduled combivent. flonase qd. Was treated with abx for a sinusitis since last time, no pred given. Requests cough syrup  ROV 03/09/16 -- Mr. Mercadel has a history of COPD, exertional hypoxemia and tobacco - he reports that he stopped smoking on 01/22/16. He reports that he has had more trouble since our last visit. More exertional SOB, chest tightness. He does not exert much - can exert for about 10 minutes. Has not been rx with pred or abx since last time. He is still on wellbutrin, wants to continue it for mood reasons. He uses ventolin 4-5x a day.   ROV 11/17/16 --this is a follow-up visit for patient with a history of tobacco, COPD, exertional hypoxemia. He remains of cigarettes.  At our last visit we continued him on scheduled Combivent, scheduled Advair. He is using combivent extra some times. Uses albuterol extra as well. We repeated his pulmonary function testing today and I have reviewed.  This shows severe obstruction (FEV1 1.39 L, 45% predicted following BD) without a significant bronchodilator response (borderline response), normal lung volumes consistent with possible superimposed restriction, a decreased diffusion capacity that does not fully correct when adjusted for his alveolar volume.  Compared with previous spirometry done 12/05/11 his FEV1 is stable from 1.61 L (43% predicted).  He is not using any oxygen. He is having SOB, happens after eating, has to walk slowly. He has cough, wheeze.  He believes that he has an early upper respiratory infection, wants to avoid  flu shot today.    No flowsheet data found.  Objective:   Physical Exam  Vitals:   11/17/16 1117  BP: 130/64  Pulse: (!) 114  SpO2: 95%  Weight: 210 lb 12.8 oz (95.6 kg)  Height: 5' 8.5" (1.74 m)   Gen: Pleasant, well-nourished, in no distress  ENT: No lesions,  mouth clear,  oropharynx clear, no postnasal drip,   Neck: No JVD, no TMG, no carotid bruits  Lungs: mostly clear, B soft end exp wheezes, no rhonchi.   Cardiovascular: RRR, heart sounds normal, no murmur or gallops, no peripheral edema  Musculoskeletal: No deformities, no cyanosis or clubbing  Neuro: alert, appears to be more sedate today, less energy. Non-focal.   Skin: Warm     Assessment & Plan:  Tobacco use disorder Reinforced his cessation.  Congratulated him for quitting.  COPD (chronic obstructive pulmonary disease) Severe obstruction.  Stable testing compared with his most recent.  Question whether he has exertional hypoxemia based on his description of his symptoms.  He is clearly overusing his short acting bronchodilator, discussed appropriate dosing with him today.  We will do a trial of Trelegy, try to replace his Combivent and Advair.  Will call us and let us know if the Trelegy is superior.  If so then we will order through his pharmacy.   Levy Pupa, MD, PhD 11/17/2016, 11:46 AM [facility] [phone] or if no answer [phone]

## 2016-12-06 ENCOUNTER — Other Ambulatory Visit: Payer: Self-pay | Admitting: Emergency Medicine

## 2016-12-19 ENCOUNTER — Telehealth: Payer: Self-pay | Admitting: Emergency Medicine

## 2016-12-19 NOTE — Telephone Encounter (Signed)
OK for him to stay on combivent. We can revisit the options at [facility]

## 2016-12-19 NOTE — Telephone Encounter (Signed)
Pt is aware of RB's recommendations and voiced his understanding.  Nothing further needed.

## 2016-12-19 NOTE — Telephone Encounter (Signed)
Pt states he has Combivent inhalers on hand and would like to continue to use that until next OV. States Trelegy was working but with his mental status he feels its better to use 2 puffs instead of 1 puff from an inhaler. He is so used to taking more than 1 puff a day of any type of inhaler.  [name]

## 2017-01-03 ENCOUNTER — Other Ambulatory Visit: Payer: Self-pay | Admitting: Emergency Medicine

## 2017-02-27 ENCOUNTER — Telehealth: Payer: Self-pay | Admitting: Emergency Medicine

## 2017-02-27 NOTE — Telephone Encounter (Signed)
We never got the CMN because [employer] faxed it to our phone # [phone] on 02/15/2017.

## 2017-02-27 NOTE — Telephone Encounter (Signed)
Please advise if you have received this CMN

## 2017-02-28 NOTE — Telephone Encounter (Signed)
Synetta Fail any status update on this?

## 2017-02-28 NOTE — Telephone Encounter (Signed)
The form is here and I had to ask Lillia Abed to help to get it filled out and she is giving it to Dr. Delton Coombes to sign

## 2017-03-01 NOTE — Telephone Encounter (Signed)
Synetta Fail please advise if this has been handled and this message can be closed.

## 2017-03-02 NOTE — Telephone Encounter (Signed)
I have faxed all the signed forms that were in Dr. Kavin Leech CMN folder. Not sure if this has been completed yet.

## 2017-03-02 NOTE — Telephone Encounter (Signed)
I faxed this form this morning. Nothing further is needed at this time.

## 2017-03-13 ENCOUNTER — Telehealth: Payer: Self-pay | Admitting: Emergency Medicine

## 2017-03-13 DIAGNOSIS — J449 Chronic obstructive pulmonary disease, unspecified: Secondary | ICD-10-CM

## 2017-03-13 NOTE — Telephone Encounter (Signed)
RB please advise, Scott [name] has called and is wanting authorization to give the patient an o2 concentrator.

## 2017-03-14 NOTE — Telephone Encounter (Signed)
Order has been placed. Scott [name] with [facility] is aware. Nothing further is needed.

## 2017-03-14 NOTE — Telephone Encounter (Signed)
Please order oxygen with exertion 2 L/min.  Portable concentrator if possible

## 2017-03-19 ENCOUNTER — Encounter: Payer: Self-pay | Admitting: Emergency Medicine

## 2017-03-19 ENCOUNTER — Encounter: Payer: Self-pay | Admitting: *Deleted

## 2017-03-19 ENCOUNTER — Ambulatory Visit (INDEPENDENT_AMBULATORY_CARE_PROVIDER_SITE_OTHER): Payer: Medicare HMO | Admitting: Emergency Medicine

## 2017-03-19 DIAGNOSIS — F17200 Nicotine dependence, unspecified, uncomplicated: Secondary | ICD-10-CM | POA: Diagnosis not present

## 2017-03-19 DIAGNOSIS — J449 Chronic obstructive pulmonary disease, unspecified: Secondary | ICD-10-CM | POA: Diagnosis not present

## 2017-03-19 NOTE — Assessment & Plan Note (Signed)
He has restarted smoking.  Discussed cessation with him.  He is on Wellbutrin currently.

## 2017-03-19 NOTE — Progress Notes (Signed)
Subjective:    Patient ID: Scott [name], male    DOB: Feb 21, 1960, 57 y.o.   MRN: [id number]  HPI  ROV 09/13/15 -- follow up visit for tobacco use, COPD, Exertional hypoxemia. He is smoking 1-2 cig a day. He wants to quit altogether, has tried patches before, may want to try try gum. He is fearful of Chantix side effects. His breathing has been about the same. He coughs up mucous daily. Has nasal congestion. He is on Advair + scheduled combivent. flonase qd. Was treated with abx for a sinusitis since last time, no pred given. Requests cough syrup  ROV 03/09/16 -- Mr. Wanat has a history of COPD, exertional hypoxemia and tobacco - he reports that he stopped smoking on 01/22/16. He reports that he has had more trouble since our last visit. More exertional SOB, chest tightness. He does not exert much - can exert for about 10 minutes. Has not been rx with pred or abx since last time. He is still on wellbutrin, wants to continue it for mood reasons. He uses ventolin 4-5x a day.   ROV 11/17/16 --this is a follow-up visit for patient with a history of tobacco, COPD, exertional hypoxemia. He remains off cigarettes.  At our last visit we continued him on scheduled Combivent, scheduled Advair. He is using combivent extra some times. Uses albuterol extra as well. We repeated his pulmonary function testing today and I have reviewed.  This shows severe obstruction (FEV1 1.39 L, 45% predicted following BD) without a significant bronchodilator response (borderline response), normal lung volumes consistent with possible superimposed restriction, a decreased diffusion capacity that does not fully correct when adjusted for his alveolar volume.  Compared with previous spirometry done 12/05/11 his FEV1 is stable from 1.61 L (43% predicted).  He is not using any oxygen. He is having SOB, happens after eating, has to walk slowly. He has cough, wheeze.  He believes that he has an early upper respiratory infection, wants to avoid  flu shot today.  ROV 03/19/17 --patient has a history of COPD with severe obstruction by spirometry, exertional hypoxemia . We did a trial of Trelegy in October '18. He developed tongue swelling, swallowing difficulty, ? Some thrush after about 3 weeks. He went back to Advair + combivent. He is back to smoking 5 cigarettes daily. He is [facility] in a study at [facility].     No flowsheet data found.  Objective:   Physical Exam  Vitals:   03/19/17 0913  BP: 132/80  Pulse: (!) 112  SpO2: (!) 89%  Weight: 218 lb (98.9 kg)  Height: 5\' 8"  (1.727 m)   Gen: Pleasant, well-nourished, in no distress  ENT: No lesions,  mouth clear,  oropharynx clear, no postnasal drip,   Neck: No JVD, no TMG, no carotid bruits  Lungs: mostly clear, no wheezes  Cardiovascular: RRR, heart sounds normal, no murmur or gallops, no peripheral edema  Musculoskeletal: No deformities, no cyanosis or clubbing  Neuro: alert, appears to be more sedate today, less energy. Non-focal.   Skin: Warm     Assessment & Plan:  COPD (chronic obstructive pulmonary disease) Did not tolerate Trelegy.  We will stay with Advair plus scheduled Combivent.  No evidence of acute exacerbation at this time.  He requested that we write a letter to explain that he had allergic reaction to Trelegy and that for this reason he was crushing medications.  Tobacco use disorder He has restarted smoking.  Discussed cessation with him.  He is  on Wellbutrin currently.   Levy Pupa, MD, PhD 03/19/2017, 9:27 AM [facility] [phone] or if no answer [phone]

## 2017-03-19 NOTE — Assessment & Plan Note (Signed)
Did not tolerate Trelegy.  We will stay with Advair plus scheduled Combivent.  No evidence of acute exacerbation at this time.  He requested that we write a letter to explain that he had allergic reaction to Trelegy and that for this reason he was crushing medications.

## 2017-03-19 NOTE — Patient Instructions (Signed)
You need to work hard on stopping smoking again. Continue your Wellbutrin as you are taking it Continue Advair twice a day. Continue Combivent 2 puffs 4 times a day. Continue your albuterol 2 puffs as needed for shortness of breath, chest tightness, wheezing. We will not restart Trelegy since you had tongue swelling on this medication. Follow with Dr Delton Coombes in 6 months or sooner if you have any problems

## 2017-04-12 ENCOUNTER — Telehealth: Payer: Self-pay | Admitting: Emergency Medicine

## 2017-04-12 NOTE — Telephone Encounter (Signed)
I retrieved the disc and placed it in RB's lookat cubby

## 2017-04-26 ENCOUNTER — Other Ambulatory Visit: Payer: Self-pay | Admitting: Emergency Medicine

## 2017-05-24 ENCOUNTER — Other Ambulatory Visit: Payer: Self-pay | Admitting: Emergency Medicine

## 2017-06-20 ENCOUNTER — Other Ambulatory Visit: Payer: Self-pay | Admitting: Emergency Medicine

## 2017-08-15 ENCOUNTER — Other Ambulatory Visit: Payer: Self-pay | Admitting: Emergency Medicine

## 2017-08-17 ENCOUNTER — Ambulatory Visit (HOSPITAL_COMMUNITY)
Admission: EM | Admit: 2017-08-17 | Discharge: 2017-08-17 | Disposition: A | Discharge status: Home / Self Care | Payer: Medicare HMO | Attending: Family Medicine | Admitting: Family Medicine

## 2017-08-17 ENCOUNTER — Encounter (HOSPITAL_COMMUNITY): Payer: Self-pay

## 2017-08-17 DIAGNOSIS — M5489 Other dorsalgia: Secondary | ICD-10-CM | POA: Diagnosis not present

## 2017-08-17 DIAGNOSIS — Z760 Encounter for issue of repeat prescription: Secondary | ICD-10-CM

## 2017-08-17 NOTE — ED Provider Notes (Signed)
[facility]    CSN: [id number] Arrival date & time: 08/17/17  1535     History   Chief Complaint Chief Complaint  Patient presents with  . Medication Refill    HPI Scott [name] is a 57 y.o. male.   HPI  Scott [name] is here requesting a refill of his oxycodone.  He states he takes 1 oxycodone every 4 hours, 6 tablets a day to treat chronic neck and back pain.  He states he has had multiple surgeries. Patient states that he is been under the care of Dr. Willey Blade for years and taking a stable dose of oxycodone 4 to 6 tablets a day during that period of time.  He states that he has been consistent getting his refills, and has not had any trouble with drug screens.  He states that 2 months ago Dr. August Saucer left the practice and a new doctor came in.  That new doctor has now left, and there is not a current doctor managing the practice.  He went today for his usual visit for medicine refills.  He saw a Publishing rights manager.  He states that she told him that she was unable to refill his oxycodone.  She did give him a prescription for diazepam, which she also takes regularly. Patient is here explaining that he needs a refill of his oxycodone until he can find another pain medicine provider. I called to the family nurse practitioner who saw the patient today.  She states she refused his oxycodone because his drug screen was negative for opioids.  She has evidence that he is not taking the medicine properly.  She therefore is not going to give him any more opioid pain medication.  She recommended he follow-up with his spine provider for management of his neck and back pain.  The family practice office did get a call from [facility] today.  They were surprised to hear another phone call from urgent care center. I explained to the patient that if his primary care doctor is no longer going to give him oxycodone, I did not feel comfortable giving him this medication either.   Patient states that he is already "in withdrawal".  He appears to be comfortable.    Past Medical History:  Diagnosis Date  . 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 COPD (chronic obstructive pulmonary disease) (HCC)   . Diabetes mellitus without complication (HCC)   . GERD (gastroesophageal reflux disease)   . Headache(784.0)   . Pneumonia    hx    Patient Active Problem List   Diagnosis Date Noted  . Tobacco use disorder 05/13/2015  . Sebaceous cyst 01/08/2014  . Hypoxia 06/01/2013  . Acute bronchitis 06/01/2013  . Diabetes mellitus without complication (HCC)   . GERD (gastroesophageal reflux disease)   . Cervical post-laminectomy syndrome 10/02/2012  . Degeneration of lumbar or lumbosacral intervertebral disc 06/01/2011  . COPD (chronic obstructive pulmonary disease) (HCC) 05/30/2011  . Hyperlipidemia 05/30/2011  . Allergic rhinitis 05/30/2011  . Cervical spondylosis with myelopathy 12/22/2010    Past Surgical History:  Procedure Laterality Date  . ANTERIOR CERVICAL DECOMP/DISCECTOMY FUSION  12/22/2010   Procedure: ANTERIOR CERVICAL DECOMPRESSION/DISCECTOMY FUSION 3 LEVELS;  Surgeon: Cristi Loron;  Location: [facility];  Service: Neurosurgery;  Laterality: N/A;  Anterior cervical discectomy with fusion cervical three-four, four-five, and five sixCDF with Interbody Prosthesis, plating, and Bone Graft   . ANTERIOR CERVICAL  DECOMP/DISCECTOMY FUSION N/A 10/16/2012   Procedure: CERVICAL SIX-SEVEN ANTERIOR CERVICAL DECOMPRESSION/DISCECTOMY FUSION WITH INTERBODY PROTHESIS PLATING BONEGRAFT WITH /POSSIBLE HARDWARE REMOVAL OLD PLATE;  Surgeon: Cristi Loron, MD;  Location: [facility];  Service: Neurosurgery;  Laterality: N/A;  . MULTIPLE TOOTH EXTRACTIONS    . OTHER SURGICAL HISTORY     surgery on cheekbone and head for fall 2000  . 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 TONSILLECTOMY         Home Medications    Prior to Admission medications   Medication Sig Start Date End Date Taking? Authorizing Provider  buPROPion (WELLBUTRIN SR) 150 MG 12 hr tablet Take 1 tablet (150 mg total) by mouth 2 (two) times daily. 05/30/16  Yes Byrum, Les Pou, MD  COMBIVENT RESPIMAT 20-100 MCG/ACT AERS respimat INHALE 1 PUFF BY MOUTH INTO THE LUNGS FOUR TIMES A DAY 04/27/17  Yes Byrum, Les Pou, MD  diazepam (VALIUM) 5 MG tablet Take 1 tablet (5 mg total) by mouth every 6 (six) hours as needed for anxiety. Patient taking differently: Take 5 mg by mouth every 12 (twelve) hours as needed for anxiety.  04/08/13  Yes Kirsteins, Victorino Sparrow, MD  ipratropium-albuterol (DUONEB) 0.5-2.5 (3) MG/3ML SOLN INHALE 1 VIAL VIA NEBULIZER EVERY 6 HOURS AS DIRECTED FOR SHORTNESS OF BREATH OR WHEEZING. 05/29/16  Yes Leslye Peer, MD  omeprazole (PRILOSEC) 40 MG capsule TAKE 1 CAPSULE BY MOUTH EVERY DAY 06/21/17  Yes Byrum, Les Pou, MD  oxyCODONE-acetaminophen (PERCOCET) 10-325 MG per tablet Take 1 tablet by mouth every 8 (eight) hours as needed for pain. Patient taking differently: Take 1 tablet by mouth every 4 (four) hours as needed for pain.  06/03/13  Yes Jeralyn Bennett, MD  rosuvastatin (CRESTOR) 10 MG tablet Take 10 mg by mouth daily.   Yes [provider]  sitaGLIPtin-metformin (JANUMET) 50-500 MG per tablet Take 1 tablet by mouth daily.   Yes [provider]  VENTOLIN HFA 108 (90 Base) MCG/ACT inhaler INHALE 1 TO 2 PUFFS BY MOUTH EVERY 6 HOURS AS NEEDED FOR WHEEZING OR SHORTNESS OF BREATH 05/29/16  Yes Leslye Peer, MD  buPROPion (WELLBUTRIN SR) 150 MG 12 hr tablet TAKE 1 TABLET BY MOUTH TWICE DAILY 05/25/17   Leslye Peer, MD  fluticasone (FLONASE) 50 MCG/ACT nasal spray INHALE 2 SPRAYS INTO BOTH NOSTRILS TWICE DAILY**NEED TO MAKE AN APPOINTMENT FOR REFILLS** 01/04/15   Leslye Peer, MD  ipratropium-albuterol (DUONEB) 0.5-2.5 (3) MG/3ML SOLN INHALE  THE CONTENTS OF 1 VIAL VIA NEBULIZER EVERY 6 HOURS AS DIRECTED FOR SHORTNESS OF BREATH OR WHEEZING 05/25/17   Leslye Peer, MD  loratadine (CLARITIN) 10 MG tablet Take 10 mg by mouth daily.    [provider]  omeprazole (PRILOSEC) 40 MG capsule TAKE 1 CAPSULE BY MOUTH EVERY DAY 05/29/16   Leslye Peer, MD  VENTOLIN HFA 108 (90 Base) MCG/ACT inhaler INHALE 1 TO 2 PUFFS BY MOUTH EVERY 6 HOURS AS NEEDED FOR WHEEZING OR SHORTNESS OF BREATH 05/25/17   Leslye Peer, MD    Family History Family History  Problem Relation Age of Onset  . Emphysema Mother   . COPD Mother   . Asthma Brother   . Asthma Sister   . Heart disease Maternal Uncle   . Hypertension Brother     Social History Social History  Tobacco Use  . Smoking status: Former Smoker    Packs/day: 0.25    Years: 41.00    Pack years: 10.25    Types: Cigarettes    Start date: 03/17/2012    Last attempt to quit: 01/22/2016    Years since quitting: 1.5  . Smokeless tobacco: Never Used  Substance Use Topics  . Alcohol use: No    Alcohol/week: 0.0 oz  . Drug use: No     Allergies   Penicillins   Review of Systems Review of Systems  Constitutional: Negative for chills and fever.  HENT: Negative for ear pain and sore throat.   Eyes: Negative for pain and visual disturbance.  Respiratory: Negative for cough and shortness of breath.   Cardiovascular: Negative for chest pain and palpitations.  Gastrointestinal: Negative for abdominal pain and vomiting.  Genitourinary: Negative for dysuria and hematuria.  Musculoskeletal: Positive for back pain and neck pain. Negative for arthralgias.  Skin: Negative for color change and rash.  Neurological: Negative for seizures and syncope.  All other systems reviewed and are negative.    Physical Exam Triage Vital Signs ED Triage Vitals  Enc Vitals Group     BP 08/17/17 1543 (!) 145/108     Pulse Rate 08/17/17 1543 (!) 121     Resp 08/17/17 1543 20     Temp 08/17/17  1543 98.6 F (37 C)     Temp Source 08/17/17 1543 Oral     SpO2 08/17/17 1543 100 %     Weight --      Height --      Head Circumference --      Peak Flow --      Pain Score 08/17/17 1551 10     Pain Loc --      Pain Edu? --      Excl. in GC? --    No data found.  Updated Vital Signs BP (!) 145/108 (BP Location: Left Arm)   Pulse (!) 121   Temp 98.6 F (37 C) (Oral)   Resp 20   SpO2 100%       Physical Exam  Constitutional: He appears well-developed and well-nourished. No distress.  Patient was able to get off the exam table without difficulty.  He walks without limp.  HENT:  Head: Normocephalic and atraumatic.  Mouth/Throat: Oropharynx is clear and moist.  Eyes: Pupils are equal, round, and reactive to light. Conjunctivae are normal.  Neck: Normal range of motion.  Cardiovascular: Normal rate.  Pulmonary/Chest: Effort normal. No respiratory distress.  Abdominal: Soft. He exhibits no distension.  Musculoskeletal: Normal range of motion. He exhibits no edema.  Neurological: He is alert.  Skin: Skin is warm and dry.     UC Treatments / Results  Labs (all labs ordered are listed, but only abnormal results are displayed) Labs Reviewed - No data to display  EKG None  Radiology No results found.  Procedures Procedures (including critical care time)  Medications Ordered in UC Medications - No data to display  Initial Impression / Assessment and Plan / UC Course  I have reviewed the triage vital signs and the nursing notes.  Pertinent labs & imaging results that were available during my care of the patient were reviewed by me and considered in my medical decision making (see chart for details).     I told the patient that if his drug screen was irregular, he was not going to get pain medication from his usual provider.  I  advised that he get another pain provider and offered him a referral to pain clinic.  When patient realized he was not going to get a  refill of oxycodone he became angry and walked out of the office without his discharge instructions. Final Clinical Impressions(s) / UC Diagnoses   Final diagnoses:  Encounter for medication refill     Discharge Instructions     Patient left without discharge information   ED Prescriptions    None     Controlled Substance Prescriptions [place] Controlled Substance Registry consulted? Yes, I have consulted the [place] Controlled Substances Registry for this patient   Eustace Moore, MD 08/17/17 [phone]

## 2017-08-17 NOTE — Discharge Instructions (Addendum)
Patient left without discharge information

## 2017-08-17 NOTE — ED Triage Notes (Signed)
Pt here requesting percocet refill

## 2017-08-20 ENCOUNTER — Other Ambulatory Visit: Payer: Self-pay

## 2017-08-20 ENCOUNTER — Emergency Department (HOSPITAL_COMMUNITY)
Admission: EM | Admit: 2017-08-20 | Discharge: 2017-08-20 | Disposition: A | Discharge status: Home / Self Care | Payer: Medicare HMO | Attending: Emergency Medicine | Admitting: Emergency Medicine

## 2017-08-20 ENCOUNTER — Encounter (HOSPITAL_COMMUNITY): Payer: Self-pay

## 2017-08-20 DIAGNOSIS — Z79899 Other long term (current) drug therapy: Secondary | ICD-10-CM | POA: Diagnosis not present

## 2017-08-20 DIAGNOSIS — Z87891 Personal history of nicotine dependence: Secondary | ICD-10-CM | POA: Diagnosis not present

## 2017-08-20 DIAGNOSIS — E119 Type 2 diabetes mellitus without complications: Secondary | ICD-10-CM | POA: Insufficient documentation

## 2017-08-20 DIAGNOSIS — F1123 Opioid dependence with withdrawal: Secondary | ICD-10-CM

## 2017-08-20 DIAGNOSIS — F1199 Opioid use, unspecified with unspecified opioid-induced disorder: Secondary | ICD-10-CM | POA: Diagnosis present

## 2017-08-20 DIAGNOSIS — J449 Chronic obstructive pulmonary disease, unspecified: Secondary | ICD-10-CM | POA: Insufficient documentation

## 2017-08-20 DIAGNOSIS — F1193 Opioid use, unspecified with withdrawal: Secondary | ICD-10-CM

## 2017-08-20 MED ORDER — ONDANSETRON 4 MG PO TBDP: 4.0000 mg | ORAL_TABLET | Freq: Four times a day (QID) | ORAL | Status: DC | PRN

## 2017-08-20 MED ORDER — ONDANSETRON 4 MG PO TBDP: 4.0000 mg | ORAL_TABLET | Freq: Three times a day (TID) | ORAL | 0 refills | Status: DC | PRN

## 2017-08-20 MED ORDER — LOPERAMIDE HCL 2 MG PO CAPS
2.0000 mg | ORAL_CAPSULE | ORAL | Status: DC | PRN
  Administered 2017-08-20: 4 mg via ORAL
  Filled 2017-08-20: qty 2

## 2017-08-20 MED ORDER — OXYCODONE-ACETAMINOPHEN 5-325 MG PO TABS
1.0000 | ORAL_TABLET | Freq: Once | ORAL | Status: AC
  Administered 2017-08-20: 1 via ORAL
  Filled 2017-08-20 (×2): qty 1

## 2017-08-20 MED ORDER — CLONIDINE HCL 0.1 MG PO TABS: 0.1000 mg | ORAL_TABLET | Freq: Three times a day (TID) | ORAL | 0 refills | Status: DC

## 2017-08-20 MED ORDER — OXYCODONE HCL 5 MG PO TABS
5.0000 mg | ORAL_TABLET | Freq: Once | ORAL | Status: AC
  Administered 2017-08-20: 5 mg via ORAL
  Filled 2017-08-20: qty 1

## 2017-08-20 MED ORDER — LOPERAMIDE HCL 2 MG PO CAPS: 2.0000 mg | ORAL_CAPSULE | Freq: Four times a day (QID) | ORAL | 0 refills | Status: DC | PRN

## 2017-08-20 MED ORDER — CLONIDINE HCL 0.1 MG PO TABS
0.1000 mg | ORAL_TABLET | Freq: Once | ORAL | Status: AC
  Administered 2017-08-20: 0.1 mg via ORAL
  Filled 2017-08-20: qty 1

## 2017-08-20 MED ORDER — OXYCODONE-ACETAMINOPHEN 5-325 MG PO TABS: 1.0000 | ORAL_TABLET | Freq: Three times a day (TID) | ORAL | 0 refills | Status: DC | PRN

## 2017-08-20 NOTE — Discharge Instructions (Signed)
Please read and follow all provided instructions.  Your diagnoses today include:  1. Narcotic withdrawal (HCC)     Tests performed today include:  Vital signs. See below for your results today.   Medications prescribed:   Percocet (oxycodone/acetaminophen) - narcotic pain medication  DO NOT drive or perform any activities that require you to be awake and alert because this medicine can make you drowsy. BE VERY CAREFUL not to take multiple medicines containing Tylenol (also called acetaminophen). Doing so can lead to an overdose which can damage your liver and cause liver failure and possibly death.   Zofran (ondansetron) - for nausea and vomiting  Take any prescribed medications only as directed.  Home care instructions:  Follow any educational materials contained in this packet.  BE VERY CAREFUL not to take multiple medicines containing Tylenol (also called acetaminophen). Doing so can lead to an overdose which can damage your liver and cause liver failure and possibly death.   Follow-up instructions: Please follow-up with your primary care provider in the next 3 days for further evaluation of your symptoms.   Return instructions:   Please return to the Emergency Department if you experience worsening symptoms.   Please return if you have any other emergent concerns.  Additional Information:  Your vital signs today were: BP (!) 182/124    Pulse (!) 107    Temp 98.4 F (36.9 C) (Oral)    Resp 14    Wt 98.9 kg (218 lb)    SpO2 91%    BMI 33.15 kg/m  If your blood pressure (BP) was elevated above 135/85 this visit, please have this repeated by your doctor within one month. --------------

## 2017-08-20 NOTE — ED Triage Notes (Signed)
Pt arrives via POV from home. Pt reports that his PCP would not fill his Oxycodone on Thursday for his Chronic pain. Pt reports chronic neck and back pain. Pt has referral for pain clinic but doesn't have an appointment till Thursday. Pt reports he is in withdrawal. Pt is diaphoretic in triage and has anxiety.

## 2017-08-20 NOTE — ED Notes (Signed)
Pt ambulatory to restroom

## 2017-08-20 NOTE — ED Provider Notes (Signed)
[facility]-EMERGENCY DEPT Provider Note   CSN: [id number] Arrival date & time: 08/20/17  1017     History   Chief Complaint Chief Complaint  Patient presents with  . Withdrawal    HPI Scott [name] is a 57 y.o. male.  Patient with history of chronic pain maintained on oxycodone 10/325 every 4 hours and Valium 5 mg every 8 hours, previously prescribed medicine by Dr. August Saucer --presents with complaint of narcotics withdrawal.  Patient states that when he went to get his prescription last week, he had a drug test that did not show the opioid.  It did show Valium and so he was prescribed this however was not prescribed any more Percocet.  This is consistent with [place] substance reporting database which showed that he should have run out of medications on 08/17/2017.  Patient reports severe withdrawal symptoms including severe neck pain consistent with his chronic pain.  He reports having sweating, vomiting, diarrhea.  He is tachycardic on arrival to the emergency department to 130 bpm.  Patient states that he went back to his doctor's office this morning and has an appointment in 3 days to help arrange for pain management appointment.  He states that he does not actively trying to stop taking opioid pain medications. The onset of this condition was acute. The course is constant. Aggravating factors: none. Alleviating factors: none.       Past Medical History:  Diagnosis Date  . 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 COPD (chronic obstructive pulmonary disease) (HCC)   . Diabetes mellitus without complication (HCC)   . GERD (gastroesophageal reflux disease)   . Headache(784.0)   . Pneumonia    hx    Patient Active Problem List   Diagnosis Date Noted  . Tobacco use disorder 05/13/2015  . Sebaceous cyst 01/08/2014  . Hypoxia 06/01/2013  . Acute bronchitis 06/01/2013  . Diabetes mellitus without complication (HCC)   . GERD  (gastroesophageal reflux disease)   . Cervical post-laminectomy syndrome 10/02/2012  . Degeneration of lumbar or lumbosacral intervertebral disc 06/01/2011  . COPD (chronic obstructive pulmonary disease) (HCC) 05/30/2011  . Hyperlipidemia 05/30/2011  . Allergic rhinitis 05/30/2011  . Cervical spondylosis with myelopathy 12/22/2010    Past Surgical History:  Procedure Laterality Date  . ANTERIOR CERVICAL DECOMP/DISCECTOMY FUSION  12/22/2010   Procedure: ANTERIOR CERVICAL DECOMPRESSION/DISCECTOMY FUSION 3 LEVELS;  Surgeon: Cristi Loron;  Location: [facility];  Service: Neurosurgery;  Laterality: N/A;  Anterior cervical discectomy with fusion cervical three-four, four-five, and five sixCDF with Interbody Prosthesis, plating, and Bone Graft   . ANTERIOR CERVICAL DECOMP/DISCECTOMY FUSION N/A 10/16/2012   Procedure: CERVICAL SIX-SEVEN ANTERIOR CERVICAL DECOMPRESSION/DISCECTOMY FUSION WITH INTERBODY PROTHESIS PLATING BONEGRAFT WITH /POSSIBLE HARDWARE REMOVAL OLD PLATE;  Surgeon: Cristi Loron, MD;  Location: [facility];  Service: Neurosurgery;  Laterality: N/A;  . MULTIPLE TOOTH EXTRACTIONS    . OTHER SURGICAL HISTORY     surgery on cheekbone and head for fall 2000  . 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 TONSILLECTOMY          Home Medications    Prior to Admission medications   Medication Sig Start Date End Date Taking? Authorizing Provider  buPROPion (WELLBUTRIN SR) 150 MG 12 hr tablet Take 1 tablet (150 mg total)  by mouth 2 (two) times daily. 05/30/16   Leslye Peer, MD  buPROPion (WELLBUTRIN SR) 150 MG 12 hr tablet TAKE 1 TABLET BY MOUTH TWICE DAILY 05/25/17   Byrum, Les Pou, MD  COMBIVENT RESPIMAT 20-100 MCG/ACT AERS respimat INHALE 1 PUFF BY MOUTH INTO THE LUNGS FOUR TIMES A DAY 04/27/17   Byrum, Les Pou, MD  diazepam (VALIUM) 5 MG tablet Take 1 tablet (5 mg total) by  mouth every 6 (six) hours as needed for anxiety. Patient taking differently: Take 5 mg by mouth every 12 (twelve) hours as needed for anxiety.  04/08/13   Kirsteins, Victorino Sparrow, MD  fluticasone (FLONASE) 50 MCG/ACT nasal spray INHALE 2 SPRAYS INTO BOTH NOSTRILS TWICE DAILY**NEED TO MAKE AN APPOINTMENT FOR REFILLS** 01/04/15   Leslye Peer, MD  ipratropium-albuterol (DUONEB) 0.5-2.5 (3) MG/3ML SOLN INHALE 1 VIAL VIA NEBULIZER EVERY 6 HOURS AS DIRECTED FOR SHORTNESS OF BREATH OR WHEEZING. 05/29/16   Leslye Peer, MD  ipratropium-albuterol (DUONEB) 0.5-2.5 (3) MG/3ML SOLN INHALE THE CONTENTS OF 1 VIAL VIA NEBULIZER EVERY 6 HOURS AS DIRECTED FOR SHORTNESS OF BREATH OR WHEEZING 05/25/17   Leslye Peer, MD  loratadine (CLARITIN) 10 MG tablet Take 10 mg by mouth daily.    [provider]  omeprazole (PRILOSEC) 40 MG capsule TAKE 1 CAPSULE BY MOUTH EVERY DAY 05/29/16   Leslye Peer, MD  omeprazole (PRILOSEC) 40 MG capsule TAKE 1 CAPSULE BY MOUTH EVERY DAY 06/21/17   Leslye Peer, MD  oxyCODONE-acetaminophen (PERCOCET) 10-325 MG per tablet Take 1 tablet by mouth every 8 (eight) hours as needed for pain. Patient taking differently: Take 1 tablet by mouth every 4 (four) hours as needed for pain.  06/03/13   Jeralyn Bennett, MD  rosuvastatin (CRESTOR) 10 MG tablet Take 10 mg by mouth daily.    [provider]  sitaGLIPtin-metformin (JANUMET) 50-500 MG per tablet Take 1 tablet by mouth daily.    [provider]  VENTOLIN HFA 108 (90 Base) MCG/ACT inhaler INHALE 1 TO 2 PUFFS BY MOUTH EVERY 6 HOURS AS NEEDED FOR WHEEZING OR SHORTNESS OF BREATH 05/29/16   Leslye Peer, MD  VENTOLIN HFA 108 (90 Base) MCG/ACT inhaler INHALE 1 TO 2 PUFFS BY MOUTH EVERY 6 HOURS AS NEEDED FOR WHEEZING OR SHORTNESS OF BREATH 05/25/17   Leslye Peer, MD    Family History Family History  Problem Relation Age of Onset  . Emphysema Mother   . COPD Mother   . Asthma Brother   . Asthma Sister   . Heart  disease Maternal Uncle   . Hypertension Brother     Social History Social History   Tobacco Use  . Smoking status: Former Smoker    Packs/day: 0.25    Years: 41.00    Pack years: 10.25    Types: Cigarettes    Start date: 03/17/2012    Last attempt to quit: 01/22/2016    Years since quitting: 1.5  . Smokeless tobacco: Never Used  Substance Use Topics  . Alcohol use: No    Alcohol/week: 0.0 oz  . Drug use: No     Allergies   Penicillins   Review of Systems Review of Systems  Constitutional: Positive for diaphoresis. Negative for fever.  HENT: Negative for rhinorrhea and sore throat.   Eyes: Negative for redness.  Respiratory: Negative for cough.   Cardiovascular: Positive for palpitations. Negative for chest pain.  Gastrointestinal: Positive for diarrhea, nausea and vomiting. Negative for abdominal pain.  Genitourinary: Negative for dysuria.  Musculoskeletal: Negative for myalgias.  Skin: Negative for rash.  Neurological: Negative for headaches.     Physical Exam Updated Vital Signs BP (!) 185/108 (BP Location: Left Arm)   Pulse (!) 130   Temp 98.4 F (36.9 C) (Oral)   Resp (!) 22   Wt 98.9 kg (218 lb)   SpO2 95%   BMI 33.15 kg/m   Physical Exam  Constitutional: He appears well-developed and well-nourished.  HENT:  Head: Normocephalic and atraumatic.  Mouth/Throat: Oropharynx is clear and moist.  Eyes: Conjunctivae are normal. Right eye exhibits no discharge. Left eye exhibits no discharge.  Neck: Normal range of motion. Neck supple.  Cardiovascular: Regular rhythm and normal heart sounds. Tachycardia present.  Pulmonary/Chest: Effort normal and breath sounds normal.  Abdominal: Soft. There is no tenderness. There is no rebound and no guarding.  Musculoskeletal: He exhibits no edema or tenderness.  Neurological: He is alert.  Skin: Skin is warm and dry.  Psychiatric: He has a normal mood and affect.  Nursing note and vitals reviewed.    ED  Treatments / Results  Labs (all labs ordered are listed, but only abnormal results are displayed) Labs Reviewed - No data to display  EKG None  Radiology No results found.  Procedures Procedures (including critical care time)  Medications Ordered in ED Medications  loperamide (IMODIUM) capsule 2-4 mg (4 mg Oral Given 08/20/17 1130)  ondansetron (ZOFRAN-ODT) disintegrating tablet 4 mg (has no administration in time range)  cloNIDine (CATAPRES) tablet 0.1 mg (0.1 mg Oral Given 08/20/17 1107)  oxyCODONE-acetaminophen (PERCOCET/ROXICET) 5-325 MG per tablet 1 tablet (1 tablet Oral Given 08/20/17 1106)    And  oxyCODONE (Oxy IR/ROXICODONE) immediate release tablet 5 mg (5 mg Oral Given 08/20/17 1107)     Initial Impression / Assessment and Plan / ED Course  I have reviewed the triage vital signs and the nursing notes.  Pertinent labs & imaging results that were available during my care of the patient were reviewed by me and considered in my medical decision making (see chart for details).     Patient seen and examined. Medications ordered.   Vital signs reviewed and are as follows: BP (!) 145/96   Pulse (!) 109   Temp 98.4 F (36.9 C) (Oral)   Resp 18   Wt 98.9 kg (218 lb)   SpO2 95%   BMI 33.15 kg/m   12:47 PM Symptoms improved with treatments here.   Home with Percocet #8, zofran, clonidine, and imodium.   Encouraged PCP f/u as planned in 3 days. Patient urged to return with worsening symptoms or other concerns. Patient verbalized understanding and agrees with plan.    Final Clinical Impressions(s) / ED Diagnoses   Final diagnoses:  Narcotic withdrawal [facility])   Patient with narcotic withdrawal, tachycardia, diaphoresis, diarrhea. Pt has high likelihood to return. His history matches [place] substance database records. I have rx Percocet #8 for this reason. Strongly encouraged PCP f/u.   ED Discharge Orders        Ordered    oxyCODONE-acetaminophen (PERCOCET/ROXICET)  5-325 MG tablet  Every 8 hours PRN     08/20/17 1243    cloNIDine (CATAPRES) 0.1 MG tablet  3 times daily,   Status:  Discontinued     08/20/17 1243    ondansetron (ZOFRAN ODT) 4 MG disintegrating tablet  Every 8 hours PRN     08/20/17 1243    loperamide (IMODIUM) 2 MG capsule  4 times daily  PRN,   Status:  Discontinued     08/20/17 1243    cloNIDine (CATAPRES) 0.1 MG tablet  3 times daily     08/20/17 1246    loperamide (IMODIUM) 2 MG capsule  4 times daily PRN     08/20/17 1246       Renne Crigler, PA-C 08/20/17 1258    Long, Arlyss Repress, MD 08/20/17 [phone]

## 2017-08-28 ENCOUNTER — Ambulatory Visit (INDEPENDENT_AMBULATORY_CARE_PROVIDER_SITE_OTHER): Payer: Medicare HMO | Admitting: Emergency Medicine

## 2017-08-28 ENCOUNTER — Other Ambulatory Visit: Payer: Self-pay

## 2017-08-28 ENCOUNTER — Encounter: Payer: Self-pay | Admitting: Emergency Medicine

## 2017-08-28 ENCOUNTER — Encounter: Payer: Self-pay | Admitting: Family Medicine

## 2017-08-28 VITALS — BP 150/90 | HR 90 | Temp 99.3°F | Resp 16 | Ht 68.0 in | Wt 206.8 lb

## 2017-08-28 DIAGNOSIS — G894 Chronic pain syndrome: Secondary | ICD-10-CM | POA: Diagnosis not present

## 2017-08-28 DIAGNOSIS — J449 Chronic obstructive pulmonary disease, unspecified: Secondary | ICD-10-CM

## 2017-08-28 DIAGNOSIS — E119 Type 2 diabetes mellitus without complications: Secondary | ICD-10-CM

## 2017-08-28 DIAGNOSIS — E785 Hyperlipidemia, unspecified: Secondary | ICD-10-CM

## 2017-08-28 DIAGNOSIS — Z7689 Persons encountering health services in other specified circumstances: Secondary | ICD-10-CM

## 2017-08-28 DIAGNOSIS — M5137 Other intervertebral disc degeneration, lumbosacral region: Secondary | ICD-10-CM

## 2017-08-28 MED ORDER — OXYCODONE-ACETAMINOPHEN 10-325 MG PO TABS: 1.0000 | ORAL_TABLET | Freq: Three times a day (TID) | ORAL | 0 refills | Status: DC | PRN

## 2017-08-28 NOTE — Progress Notes (Signed)
ESTAB

## 2017-08-28 NOTE — Patient Instructions (Addendum)
   IF you received an x-ray today, you will receive an invoice from [facility]. Please contact [facility] at [phone] with questions or concerns regarding your invoice.   IF you received labwork today, you will receive an invoice from [facility]. Please contact [facility] at [phone] with questions or concerns regarding your invoice.   Our billing staff will not be able to assist you with questions regarding bills from these companies.  You will be contacted with the lab results as soon as they are available. The fastest way to get your results is to activate your My Chart account. Instructions are located on the last page of this paperwork. If you have not heard from us regarding the results in 2 weeks, please contact this office.       Chronic Pain, Adult Chronic pain is a type of pain that lasts or keeps coming back (recurs) for at least six months. You may have chronic headaches, abdominal pain, or body pain. Chronic pain may be related to an illness, such as fibromyalgia or complex regional pain syndrome. Sometimes the cause of chronic pain is not known. Chronic pain can make it hard for you to do daily activities. If not treated, chronic pain can lead to other health problems, including anxiety and depression. Treatment depends on the cause and severity of your pain. You may need to work with a pain specialist to come up with a treatment plan. The plan may include medicine, counseling, and physical therapy. Many people benefit from a combination of two or more types of treatment to control their pain. Follow these instructions at home: Lifestyle  Consider keeping a pain diary to share with your health care providers.  Consider talking with a mental health care provider (psychologist) about how to cope with chronic pain.  Consider joining a chronic pain support group.  Try to control or lower your stress levels. Talk to your health care provider about  strategies to do this. General instructions   Take over-the-counter and prescription medicines only as told by your health care provider.  Follow your treatment plan as told by your health care provider. This may include: ? Gentle, regular exercise. ? Eating a healthy diet that includes foods such as vegetables, fruits, fish, and lean meats. ? Cognitive or behavioral therapy. ? Working with a physical therapist. ? Meditation or yoga. ? Acupuncture or massage therapy. ? Aroma, color, light, or sound therapy. ? Local electrical stimulation. ? Shots (injections) of numbing or pain-relieving medicines into the spine or the area of pain.  Check your pain level as told by your health care provider. Ask your health care provider if you should use a pain scale.  Learn as much as you can about how to manage your chronic pain. Ask your health care provider if an intensive pain rehabilitation program or a chronic pain specialist would be helpful.  Keep all follow-up visits as told by your health care provider. This is important. Contact a health care provider if:  Your pain gets worse.  You have new pain.  You have trouble sleeping.  You have trouble doing your normal activities.  Your pain is not controlled with treatment.  Your have side effects from pain medicine.  You feel weak. Get help right away if:  You lose feeling or have numbness in your body.  You lose control of bowel or bladder function.  Your pain suddenly gets much worse.  You develop shaking or chills.  You develop confusion.    You develop chest pain.  You have trouble breathing or shortness of breath.  You pass out.  You have thoughts about hurting yourself or others. This information is not intended to replace advice given to you by your health care provider. Make sure you discuss any questions you have with your health care provider. Document Released: 10/01/2001 Document Revised: 09/09/2015 Document  Reviewed: 06/29/2015 [employer] Interactive Patient Education  2018 [employer]  

## 2017-08-28 NOTE — Progress Notes (Signed)
Scott [name] 57 y.o.   Chief Complaint  Patient presents with  . Establish Care  . Medication Refill    HISTORY OF PRESENT ILLNESS: This is a 57 y.o. male here to establish care today.  First visit to this clinic.  Patient has a following chronic medical problems: 1.  COPD: On multiple medications uses O2 2 L/min at nighttime. 2.  Diabetes: Multiple medications. 3.  High cholesterol. 4.  Degenerative disc disease of the spine. 5.  Chronic neck and back pain: Takes oxycodone and Valium daily.  Pain management doctors retired or not available anymore.  Looking for pain management center.  Recently seen in the emergency room for opiate withdrawal symptoms on 08/20/2017.  Requesting medication refills.  HPI   Prior to Admission medications   Medication Sig Start Date End Date Taking? Authorizing Provider  buPROPion (WELLBUTRIN SR) 150 MG 12 hr tablet Take 1 tablet (150 mg total) by mouth 2 (two) times daily. 05/30/16  Yes Byrum, Les Pou, MD  COMBIVENT RESPIMAT 20-100 MCG/ACT AERS respimat INHALE 1 PUFF BY MOUTH INTO THE LUNGS FOUR TIMES A DAY 04/27/17  Yes Byrum, Les Pou, MD  diazepam (VALIUM) 5 MG tablet Take 1 tablet (5 mg total) by mouth every 6 (six) hours as needed for anxiety. Patient taking differently: Take 5 mg by mouth every 12 (twelve) hours as needed for anxiety.  04/08/13  Yes Kirsteins, Victorino Sparrow, MD  ipratropium-albuterol (DUONEB) 0.5-2.5 (3) MG/3ML SOLN INHALE 1 VIAL VIA NEBULIZER EVERY 6 HOURS AS DIRECTED FOR SHORTNESS OF BREATH OR WHEEZING. 05/29/16  Yes Leslye Peer, MD  omeprazole (PRILOSEC) 40 MG capsule TAKE 1 CAPSULE BY MOUTH EVERY DAY 05/29/16  Yes Byrum, Les Pou, MD  oxyCODONE-acetaminophen (PERCOCET) 10-325 MG per tablet Take 1 tablet by mouth every 8 (eight) hours as needed for pain. Patient taking differently: Take 1 tablet by mouth every 4 (four) hours as needed for pain.  06/03/13  Yes Jeralyn Bennett, MD  rosuvastatin (CRESTOR) 10 MG tablet Take 10 mg by mouth  daily.   Yes [provider]  sitaGLIPtin-metformin (JANUMET) 50-500 MG per tablet Take 1 tablet by mouth daily.   Yes [provider]  VENTOLIN HFA 108 (90 Base) MCG/ACT inhaler INHALE 1 TO 2 PUFFS BY MOUTH EVERY 6 HOURS AS NEEDED FOR WHEEZING OR SHORTNESS OF BREATH 05/29/16  Yes Byrum, Les Pou, MD  Acetaminophen-Aspirin Buffered (EXCEDRIN BACK & BODY PO) Take 1 tablet by mouth daily as needed.    [provider]  cloNIDine (CATAPRES) 0.1 MG tablet Take 1 tablet (0.1 mg total) by mouth 3 (three) times daily. Patient not taking: Reported on 08/28/2017 08/20/17   Renne Crigler, PA-C  loperamide (IMODIUM) 2 MG capsule Take 1 capsule (2 mg total) by mouth 4 (four) times daily as needed for diarrhea or loose stools. Patient not taking: Reported on 08/28/2017 08/20/17   Renne Crigler, PA-C  ondansetron (ZOFRAN ODT) 4 MG disintegrating tablet Take 1 tablet (4 mg total) by mouth every 8 (eight) hours as needed for nausea or vomiting. Patient not taking: Reported on 08/28/2017 08/20/17   Renne Crigler, PA-C  oxyCODONE-acetaminophen (PERCOCET/ROXICET) 5-325 MG tablet Take 1 tablet by mouth every 8 (eight) hours as needed for severe pain. Patient not taking: Reported on 08/28/2017 08/20/17   Renne Crigler, PA-C    Allergies  Allergen Reactions  . Penicillins Anaphylaxis    Has patient had a PCN reaction causing immediate rash, facial/tongue/throat swelling, SOB or lightheadedness with hypotension: Yes Has patient  had a PCN reaction causing severe rash involving mucus membranes or skin necrosis: Yes Has patient had a PCN reaction that required hospitalization: Yes Has patient had a PCN reaction occurring within the last 10 years: No If all of the above answers are "NO", then may proceed with Cephalosporin use.     Patient Active Problem List   Diagnosis Date Noted  . Tobacco use disorder 05/13/2015  . Sebaceous cyst 01/08/2014  . Hypoxia 06/01/2013  . Acute bronchitis 06/01/2013    . Diabetes mellitus without complication (HCC)   . GERD (gastroesophageal reflux disease)   . Cervical post-laminectomy syndrome 10/02/2012  . Degeneration of lumbar or lumbosacral intervertebral disc 06/01/2011  . COPD (chronic obstructive pulmonary disease) (HCC) 05/30/2011  . Hyperlipidemia 05/30/2011  . Allergic rhinitis 05/30/2011  . Cervical spondylosis with myelopathy 12/22/2010    Past Medical History:  Diagnosis Date  . 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 COPD (chronic obstructive pulmonary disease) (HCC)   . Diabetes mellitus without complication (HCC)   . GERD (gastroesophageal reflux disease)   . Headache(784.0)   . Pneumonia    hx    Past Surgical History:  Procedure Laterality Date  . ANTERIOR CERVICAL DECOMP/DISCECTOMY FUSION  12/22/2010   Procedure: ANTERIOR CERVICAL DECOMPRESSION/DISCECTOMY FUSION 3 LEVELS;  Surgeon: Cristi Loron;  Location: [facility];  Service: Neurosurgery;  Laterality: N/A;  Anterior cervical discectomy with fusion cervical three-four, four-five, and five sixCDF with Interbody Prosthesis, plating, and Bone Graft   . ANTERIOR CERVICAL DECOMP/DISCECTOMY FUSION N/A 10/16/2012   Procedure: CERVICAL SIX-SEVEN ANTERIOR CERVICAL DECOMPRESSION/DISCECTOMY FUSION WITH INTERBODY PROTHESIS PLATING BONEGRAFT WITH /POSSIBLE HARDWARE REMOVAL OLD PLATE;  Surgeon: Cristi Loron, MD;  Location: [facility];  Service: Neurosurgery;  Laterality: N/A;  . MULTIPLE TOOTH EXTRACTIONS    . OTHER SURGICAL HISTORY     surgery on cheekbone and head for fall 2000  . 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 TONSILLECTOMY      Social History   Socioeconomic History  . Marital status: Married    Spouse name: Not on file  . Number of children: Not on file  . Years of education: Not on file  . Highest education level: Not on file   Occupational History  . Occupation: DISABLED  Social Needs  . Financial resource strain: Not on file  . Food insecurity:    Worry: Not on file    Inability: Not on file  . Transportation needs:    Medical: Not on file    Non-medical: Not on file  Tobacco Use  . Smoking status: Former Smoker    Packs/day: 0.25    Years: 41.00    Pack years: 10.25    Types: Cigarettes    Start date: 03/17/2012    Last attempt to quit: 01/22/2016    Years since quitting: 1.6  . Smokeless tobacco: Never Used  Substance and Sexual Activity  . Alcohol use: No    Alcohol/week: 0.0 oz  . Drug use: No  . Sexual activity: Not on file  Lifestyle  . Physical activity:    Days per week: Not on file    Minutes per session: Not on file  . Stress: Not on file  Relationships  . Social connections:    Talks on phone: Not on  file    Gets together: Not on file    Attends religious service: Not on file    Active member of club or organization: Not on file    Attends meetings of clubs or organizations: Not on file    Relationship status: Not on file  . Intimate partner violence:    Fear of current or ex partner: Not on file    Emotionally abused: Not on file    Physically abused: Not on file    Forced sexual activity: Not on file  Other Topics Concern  . Not on file  Social History Narrative  . Not on file    Family History  Problem Relation Age of Onset  . Emphysema Mother   . COPD Mother   . Asthma Brother   . Asthma Sister   . Heart disease Maternal Uncle   . Hypertension Brother      Review of Systems  Constitutional: Negative.  Negative for chills, fever and weight loss.  HENT: Negative.  Negative for congestion, hearing loss, sinus pain and sore throat.   Eyes: Negative.  Negative for blurred vision and double vision.  Respiratory: Positive for cough, shortness of breath and wheezing. Negative for hemoptysis and sputum production.   Cardiovascular: Negative.  Negative for chest pain  and palpitations.  Gastrointestinal: Negative.  Negative for abdominal pain, nausea and vomiting.  Genitourinary: Negative.  Negative for dysuria and hematuria.  Musculoskeletal: Positive for back pain and neck pain.  Skin: Negative.  Negative for rash.  Neurological: Negative.  Negative for dizziness and headaches.  Endo/Heme/Allergies: Negative.   All other systems reviewed and are negative.   Vitals:   08/28/17 1347  BP: (!) 150/90  Pulse: 90  Resp: 16  Temp: 99.3 F (37.4 C)  SpO2: 94%     Physical Exam  Constitutional: He is oriented to person, place, and time. He appears well-developed and well-nourished.  HENT:  Head: Normocephalic and atraumatic.  Eyes: Pupils are equal, round, and reactive to light. EOM are normal.  Neck: Normal range of motion. Neck supple.  Cardiovascular: Normal rate and regular rhythm.  Pulmonary/Chest: Effort normal and breath sounds normal.  Abdominal: Soft. There is no tenderness.  Musculoskeletal: Normal range of motion.  Neurological: He is alert and oriented to person, place, and time. No sensory deficit. He exhibits normal muscle tone.  Skin: Skin is warm and dry. Capillary refill takes less than 2 seconds. No rash noted.  Psychiatric: He has a normal mood and affect. His behavior is normal.  Vitals reviewed.   Chronic pain syndrome Will refer patient to pain management clinic.  Advised that we will not be handling his chronic pain problem and or medications.  Patient verbalizes understanding.  I agreed to prescribe today 9 tablets of oxycodone but will not refill in the future.  A total of 30 minutes was spent in the room with the patient, greater than 50% of which was in counseling/coordination of care regarding chronic medical problems, medications, lifestyle choices, nutrition and need for chronic follow-up  ASSESSMENT & PLAN: Scott [name] was seen today for establish care and medication refill.  Diagnoses and all orders for this  visit:  Diabetes mellitus without complication (HCC)  Chronic obstructive pulmonary disease, unspecified COPD type (HCC)  Hyperlipidemia, unspecified hyperlipidemia type  Chronic pain syndrome Comments: Chronic neck and back pain Orders: -     Ambulatory referral to Pain Clinic  Degeneration of lumbar or lumbosacral intervertebral disc  Encounter to establish care  Other orders -     oxyCODONE-acetaminophen (PERCOCET) 10-325 MG tablet; Take 1 tablet by mouth every 8 (eight) hours as needed for pain.    Patient Instructions       IF you received an x-ray today, you will receive an invoice from [facility]. Please contact [facility] at [phone] with questions or concerns regarding your invoice.   IF you received labwork today, you will receive an invoice from [facility]. Please contact [facility] at [phone] with questions or concerns regarding your invoice.   Our billing staff will not be able to assist you with questions regarding bills from these companies.  You will be contacted with the lab results as soon as they are available. The fastest way to get your results is to activate your My Chart account. Instructions are located on the last page of this paperwork. If you have not heard from [place] regarding the results in 2 weeks, please contact this office.     Chronic Pain, Adult Chronic pain is a type of pain that lasts or keeps coming back (recurs) for at least six months. You may have chronic headaches, abdominal pain, or body pain. Chronic pain may be related to an illness, such as fibromyalgia or complex regional pain syndrome. Sometimes the cause of chronic pain is not known. Chronic pain can make it hard for you to do daily activities. If not treated, chronic pain can lead to other health problems, including anxiety and depression. Treatment depends on the cause and severity of your pain. You may need to work with a pain specialist to come up with  a treatment plan. The plan may include medicine, counseling, and physical therapy. Many people benefit from a combination of two or more types of treatment to control their pain. Follow these instructions at home: Lifestyle  Consider keeping a pain diary to share with your health care providers.  Consider talking with a mental health care provider (psychologist) about how to cope with chronic pain.  Consider joining a chronic pain support group.  Try to control or lower your stress levels. Talk to your health care provider about strategies to do this. General instructions   Take over-the-counter and prescription medicines only as told by your health care provider.  Follow your treatment plan as told by your health care provider. This may include: ? Gentle, regular exercise. ? Eating a healthy diet that includes foods such as vegetables, fruits, fish, and lean meats. ? Cognitive or behavioral therapy. ? Working with a Adult nurse. ? Meditation or yoga. ? Acupuncture or massage therapy. ? Aroma, color, light, or sound therapy. ? Local electrical stimulation. ? Shots (injections) of numbing or pain-relieving medicines into the spine or the area of pain.  Check your pain level as told by your health care provider. Ask your health care provider if you should use a pain scale.  Learn as much as you can about how to manage your chronic pain. Ask your health care provider if an intensive pain rehabilitation program or a chronic pain specialist would be helpful.  Keep all follow-up visits as told by your health care provider. This is important. Contact a health care provider if:  Your pain gets worse.  You have new pain.  You have trouble sleeping.  You have trouble doing your normal activities.  Your pain is not controlled with treatment.  Your have side effects from pain medicine.  You feel weak. Get help right away if:  You lose feeling or have  numbness in your  body.  You lose control of bowel or bladder function.  Your pain suddenly gets much worse.  You develop shaking or chills.  You develop confusion.  You develop chest pain.  You have trouble breathing or shortness of breath.  You pass out.  You have thoughts about hurting yourself or others. This information is not intended to replace advice given to you by your health care provider. Make sure you discuss any questions you have with your health care provider. Document Released: 10/01/2001 Document Revised: 09/09/2015 Document Reviewed: 06/29/2015 Elsevier Interactive Patient Education  2018 [employer]vier [employer].      Edwina Barth, MD [facility]

## 2017-08-29 ENCOUNTER — Encounter: Payer: Self-pay | Admitting: Family Medicine

## 2017-08-29 ENCOUNTER — Encounter: Payer: Self-pay | Admitting: Emergency Medicine

## 2017-08-29 DIAGNOSIS — G894 Chronic pain syndrome: Secondary | ICD-10-CM | POA: Insufficient documentation

## 2017-08-29 NOTE — Assessment & Plan Note (Signed)
Will refer patient to pain management clinic.  Advised that we will not be handling his chronic pain problem and or medications.  Patient verbalizes understanding.  I agreed to prescribe today 9 tablets of oxycodone but will not refill in the future.

## 2017-09-07 ENCOUNTER — Telehealth: Payer: Self-pay | Admitting: Emergency Medicine

## 2017-09-07 MED ORDER — VENTOLIN HFA 108 (90 BASE) MCG/ACT IN AERS: INHALATION_SPRAY | RESPIRATORY_TRACT | 2 refills | Status: DC

## 2017-09-07 NOTE — Telephone Encounter (Signed)
Spoke with pt. He is needing a refill on Ventolin. Rx has been sent in. Nothing further was needed. 

## 2017-09-19 ENCOUNTER — Ambulatory Visit (INDEPENDENT_AMBULATORY_CARE_PROVIDER_SITE_OTHER): Payer: Medicare HMO | Admitting: Family Medicine

## 2017-09-19 ENCOUNTER — Encounter: Payer: Self-pay | Admitting: Family Medicine

## 2017-09-19 ENCOUNTER — Other Ambulatory Visit: Payer: Self-pay

## 2017-09-19 VITALS — BP 145/82 | HR 122 | Temp 99.0°F | Ht 68.0 in | Wt 208.8 lb

## 2017-09-19 DIAGNOSIS — M961 Postlaminectomy syndrome, not elsewhere classified: Secondary | ICD-10-CM

## 2017-09-19 DIAGNOSIS — G894 Chronic pain syndrome: Secondary | ICD-10-CM | POA: Diagnosis not present

## 2017-09-19 DIAGNOSIS — E785 Hyperlipidemia, unspecified: Secondary | ICD-10-CM

## 2017-09-19 DIAGNOSIS — Z5181 Encounter for therapeutic drug level monitoring: Secondary | ICD-10-CM

## 2017-09-19 DIAGNOSIS — N509 Disorder of male genital organs, unspecified: Secondary | ICD-10-CM

## 2017-09-19 DIAGNOSIS — M5137 Other intervertebral disc degeneration, lumbosacral region: Secondary | ICD-10-CM | POA: Diagnosis not present

## 2017-09-19 DIAGNOSIS — E119 Type 2 diabetes mellitus without complications: Secondary | ICD-10-CM

## 2017-09-19 DIAGNOSIS — N5089 Other specified disorders of the male genital organs: Secondary | ICD-10-CM

## 2017-09-19 MED ORDER — DIAZEPAM 5 MG PO TABS: ORAL_TABLET | ORAL | 0 refills | Status: DC

## 2017-09-19 NOTE — Patient Instructions (Addendum)
Weaning off valium Decreased percocet to 5 tabs a day    If you have lab work done today you will be contacted with your lab results within the next 2 weeks.  If you have not heard from [place] then please contact us. The fastest way to get your results is to register for My Chart.   IF you received an x-ray today, you will receive an invoice from [facility]. Please contact [facility] at [phone] with questions or concerns regarding your invoice.   IF you received labwork today, you will receive an invoice from [facility]. Please contact [facility] at [phone] with questions or concerns regarding your invoice.   Our billing staff will not be able to assist you with questions regarding bills from these companies.  You will be contacted with the lab results as soon as they are available. The fastest way to get your results is to activate your My Chart account. Instructions are located on the last page of this paperwork. If you have not heard from [place] regarding the results in 2 weeks, please contact this office.

## 2017-09-19 NOTE — Progress Notes (Signed)
8/28/20199:45 AM  Scott [name] May 04, 1960, 57 y.o. male [id number]  Chief Complaint  Patient presents with  . Establish Care    Looking for pcp to handle pain management. Needing controlled medication refilled, albuterol. Has referral for pain management, however transportaion is an issue    HPI:   Patient is a 57 y.o. male with past medical history significant for DM2, HTN, HLP, COPD, chronic pain who presents today to establish care  Pmp reviewed Last percocet rx filled 08/31/17 for # 150 percocet 10-325mg  - provider at [facility] Last valium rx filled in 08/17/17 for #90 valium 5mg  - provider at [facility] Last valium yesterday, has been taking clonidine to help with sweats, shakes Has never been off valium Still has 40 tabs of percocet left  Used to see Dr August Saucer in [place] [facility] - however entire clinic staff got turned over. Not managing pain patients anymore. was getting #180  Then went to [facility] but was referred to pain mgt in [place] but unable to go due to uses scat services  H/o cervical spine surgery x 2, last one 2 years ago No surgery in his lower back Has done injections for his upper and middle back but none in lower back Last ones did not provide any significant relief, does not want anymore injections Dr Tressie Stalker is his neurosurgeon, he wants to reimage back again, this has not been arranged yet, last contact was about 10 days ago   Pulm - Dr Levy Pupa - involved in research project for COPD, no medications involved,  Uses oxygen 2L Restarted smoking about 3-4 cig day, had stopped for a year and year and half.  rx wellbutrin for quiting. Has not started yet    Fall Risk  09/19/2017 08/28/2017  Falls in the past year? No Yes  Number falls in past yr: - 1  Injury with Fall? - Yes  Comment - KNEES     Depression screen [facility] 2/9 09/19/2017 08/28/2017  Decreased Interest 0 3  Down, Depressed, Hopeless 0 3  PHQ - 2 Score 0 6  Altered sleeping  - 3  Tired, decreased energy - 2  Change in appetite - 2  Feeling bad or failure about yourself  - 2  Trouble concentrating - 3  Moving slowly or fidgety/restless - 0  Suicidal thoughts - 0  PHQ-9 Score - 18    Allergies  Allergen Reactions  . Penicillins Anaphylaxis    Has patient had a PCN reaction causing immediate rash, facial/tongue/throat swelling, SOB or lightheadedness with hypotension: Yes Has patient had a PCN reaction causing severe rash involving mucus membranes or skin necrosis: Yes Has patient had a PCN reaction that required hospitalization: Yes Has patient had a PCN reaction occurring within the last 10 years: No If all of the above answers are "NO", then may proceed with Cephalosporin use.     Prior to Admission medications   Medication Sig Start Date End Date Taking? Authorizing Provider  Acetaminophen-Aspirin Buffered (EXCEDRIN BACK & BODY PO) Take 1 tablet by mouth daily as needed.    [provider]  buPROPion (WELLBUTRIN SR) 150 MG 12 hr tablet Take 1 tablet (150 mg total) by mouth 2 (two) times daily. 05/30/16   Leslye Peer, MD  cloNIDine (CATAPRES) 0.1 MG tablet Take 1 tablet (0.1 mg total) by mouth 3 (three) times daily. Patient not taking: Reported on 08/28/2017 08/20/17   Renne Crigler, PA-C  COMBIVENT RESPIMAT 20-100 MCG/ACT AERS respimat INHALE  1 PUFF BY MOUTH INTO THE LUNGS FOUR TIMES A DAY 04/27/17   Byrum, Les Pou, MD  diazepam (VALIUM) 5 MG tablet Take 1 tablet (5 mg total) by mouth every 6 (six) hours as needed for anxiety. Patient taking differently: Take 5 mg by mouth every 12 (twelve) hours as needed for anxiety.  04/08/13   Kirsteins, Victorino Sparrow, MD  ipratropium-albuterol (DUONEB) 0.5-2.5 (3) MG/3ML SOLN INHALE 1 VIAL VIA NEBULIZER EVERY 6 HOURS AS DIRECTED FOR SHORTNESS OF BREATH OR WHEEZING. 05/29/16   Leslye Peer, MD  omeprazole (PRILOSEC) 40 MG capsule TAKE 1 CAPSULE BY MOUTH EVERY DAY 05/29/16   Leslye Peer, MD    oxyCODONE-acetaminophen (PERCOCET) 10-325 MG tablet Take 1 tablet by mouth every 8 (eight) hours as needed for pain. 08/28/17   Georgina Quint, MD  oxyCODONE-acetaminophen (PERCOCET/ROXICET) 5-325 MG tablet Take 1 tablet by mouth every 8 (eight) hours as needed for severe pain. Patient not taking: Reported on 08/28/2017 08/20/17   Renne Crigler, PA-C  rosuvastatin (CRESTOR) 10 MG tablet Take 10 mg by mouth daily.    [provider]  sitaGLIPtin-metformin (JANUMET) 50-500 MG per tablet Take 1 tablet by mouth daily.    [provider]  VENTOLIN HFA 108 (90 Base) MCG/ACT inhaler INHALE 1 TO 2 PUFFS BY MOUTH EVERY 6 HOURS AS NEEDED FOR WHEEZING OR SHORTNESS OF BREATH 09/07/17   Leslye Peer, MD    Past Medical History:  Diagnosis Date  . 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 COPD (chronic obstructive pulmonary disease) (HCC)   . Diabetes mellitus without complication (HCC)   . GERD (gastroesophageal reflux disease)   . Headache(784.0)   . Pneumonia    hx    Past Surgical History:  Procedure Laterality Date  . ANTERIOR CERVICAL DECOMP/DISCECTOMY FUSION  12/22/2010   Procedure: ANTERIOR CERVICAL DECOMPRESSION/DISCECTOMY FUSION 3 LEVELS;  Surgeon: Cristi Loron;  Location: [facility];  Service: Neurosurgery;  Laterality: N/A;  Anterior cervical discectomy with fusion cervical three-four, four-five, and five sixCDF with Interbody Prosthesis, plating, and Bone Graft   . ANTERIOR CERVICAL DECOMP/DISCECTOMY FUSION N/A 10/16/2012   Procedure: CERVICAL SIX-SEVEN ANTERIOR CERVICAL DECOMPRESSION/DISCECTOMY FUSION WITH INTERBODY PROTHESIS PLATING BONEGRAFT WITH /POSSIBLE HARDWARE REMOVAL OLD PLATE;  Surgeon: Cristi Loron, MD;  Location: [facility];  Service: Neurosurgery;  Laterality: N/A;  . MULTIPLE TOOTH EXTRACTIONS    . OTHER SURGICAL HISTORY     surgery on cheekbone and head for fall 2000  . 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 SPINE SURGERY     2014 and 2016 - Dr Lovell Sheehan  . TONSILLECTOMY      Social History   Tobacco Use  . Smoking status: Former Smoker    Packs/day: 0.25    Years: 41.00    Pack years: 10.25    Types: Cigarettes    Start date: 03/17/2012    Last attempt to quit: 01/22/2016    Years since quitting: 1.6  . Smokeless tobacco: Never Used  Substance Use Topics  . Alcohol use: No    Alcohol/week: 0.0 standard drinks    Family History  Problem Relation Age of Onset  . Emphysema Mother   . COPD Mother   . Asthma Brother   . Diabetes Brother   . Hyperlipidemia  Brother   . Hypertension Brother   . Asthma Sister   . Hyperlipidemia Sister   . Hypertension Sister   . Mental illness Sister   . Heart disease Maternal Uncle   . Hypertension Brother     Review of Systems  Constitutional: Negative for chills and fever.  Respiratory: Negative for cough and shortness of breath.   Cardiovascular: Negative for chest pain, palpitations and leg swelling.  Gastrointestinal: Negative for abdominal pain, nausea and vomiting.  Genitourinary: Negative for hematuria.       Having testicular lesion for past several months H/o penile tumor many years  Musculoskeletal: Positive for back pain and neck pain.    OBJECTIVE:  Blood pressure (!) 145/82, pulse (!) 122, temperature 99 F (37.2 C), temperature source Oral, height 5\' 8"  (1.727 m), weight 208 lb 12.8 oz (94.7 kg), SpO2 91 %. Body mass index is 31.75 kg/m.   Physical Exam  Constitutional: He is oriented to person, place, and time. He appears well-developed and well-nourished.  HENT:  Head: Normocephalic and atraumatic.  Mouth/Throat: Oropharynx is clear and moist.  Eyes: Pupils are equal, round, and reactive to light. Conjunctivae and EOM are normal.  Neck: Neck supple.  Pulmonary/Chest: Effort normal.  Neurological: He is alert and oriented  to person, place, and time.  Skin: Skin is warm and dry.  Psychiatric: He has a normal mood and affect.  Nursing note and vitals reviewed.   ASSESSMENT and PLAN  1. Diabetes mellitus without complication (HCC) Checking labs today, medications will be adjusted as needed.  - TSH - CBC - Comprehensive metabolic panel  2. Hyperlipidemia, unspecified hyperlipidemia type Checking labs today, medications will be adjusted as needed.  - Lipid panel - CBC - Comprehensive metabolic panel  3. Chronic pain syndrome 4. Degeneration of lumbar or lumbosacral intervertebral disc 5. Cervical post-laminectomy syndrome 6. Encounter for therapeutic drug monitoring pmp and medical records reviewed. Requesting records from Dr Lovell Sheehan. Discussed need to wean off valium and decrease percocet given risk of accidental OD. Discussed weaning regime of valium.  - ToxASSURE Select 13 (MW), Urine  7. Testicular mass - Ambulatory referral to Urology  Other orders - diazepam (VALIUM) 5 MG tablet; Take 5mg  (1 tablet) every morning and night, take 2.5mg  (1/2 tablet) every afternoon  Return in about 8 days (around 09/27/2017).    Myles Lipps, MD Primary Care at [facility] [address] [place], [place] [address] Ph.  [phone] Fax [phone]

## 2017-09-20 LAB — COMPREHENSIVE METABOLIC PANEL
ALT: 19 IU/L (ref 0–44)
AST: 20 IU/L (ref 0–40)
Albumin/Globulin Ratio: 1.7 (ref 1.2–2.2)
Albumin: 4.7 g/dL (ref 3.5–5.5)
Alkaline Phosphatase: 102 IU/L (ref 39–117)
BUN/Creatinine Ratio: 10 (ref 9–20)
BUN: 13 mg/dL (ref 6–24)
Bilirubin Total: 1 mg/dL (ref 0.0–1.2)
CO2: 20 mmol/L (ref 20–29)
Calcium: 9.9 mg/dL (ref 8.7–10.2)
Chloride: 107 mmol/L — ABNORMAL HIGH (ref 96–106)
Creatinine, Ser: 1.26 mg/dL (ref 0.76–1.27)
GFR calc Af Amer: 73 mL/min/{1.73_m2} (ref >59)
GFR calc non Af Amer: 63 mL/min/{1.73_m2} (ref >59)
Globulin, Total: 2.7 g/dL (ref 1.5–4.5)
Glucose: 122 mg/dL — ABNORMAL HIGH (ref 65–99)
Potassium: 4.3 mmol/L (ref 3.5–5.2)
Sodium: 146 mmol/L — ABNORMAL HIGH (ref 134–144)
Total Protein: 7.4 g/dL (ref 6.0–8.5)

## 2017-09-20 LAB — TSH: TSH: 0.728 u[IU]/mL (ref 0.450–4.500)

## 2017-09-20 LAB — CBC
Hematocrit: 53.2 % — ABNORMAL HIGH (ref 37.5–51.0)
Hemoglobin: 17.9 g/dL — ABNORMAL HIGH (ref 13.0–17.7)
MCH: 29.2 pg (ref 26.6–33.0)
MCHC: 33.6 g/dL (ref 31.5–35.7)
MCV: 87 fL (ref 79–97)
Platelets: 288 10*3/uL (ref 150–450)
RBC: 6.14 x10E6/uL — ABNORMAL HIGH (ref 4.14–5.80)
RDW: 14.2 % (ref 12.3–15.4)
WBC: 7.6 10*3/uL (ref 3.4–10.8)

## 2017-09-20 LAB — LIPID PANEL
Chol/HDL Ratio: 5.8 ratio — ABNORMAL HIGH (ref 0.0–5.0)
Cholesterol, Total: 208 mg/dL — ABNORMAL HIGH (ref 100–199)
HDL: 36 mg/dL — ABNORMAL LOW (ref >39)
LDL Calculated: 120 mg/dL — ABNORMAL HIGH (ref 0–99)
Triglycerides: 259 mg/dL — ABNORMAL HIGH (ref 0–149)
VLDL Cholesterol Cal: 52 mg/dL — ABNORMAL HIGH (ref 5–40)

## 2017-09-25 ENCOUNTER — Other Ambulatory Visit: Payer: Self-pay

## 2017-09-25 ENCOUNTER — Encounter: Payer: Self-pay | Admitting: Family Medicine

## 2017-09-25 ENCOUNTER — Ambulatory Visit (INDEPENDENT_AMBULATORY_CARE_PROVIDER_SITE_OTHER): Payer: Medicare HMO | Admitting: Family Medicine

## 2017-09-25 ENCOUNTER — Other Ambulatory Visit: Payer: Self-pay | Admitting: Emergency Medicine

## 2017-09-25 VITALS — BP 133/80 | HR 60 | Temp 98.3°F | Ht 68.0 in | Wt 209.4 lb

## 2017-09-25 DIAGNOSIS — G894 Chronic pain syndrome: Secondary | ICD-10-CM

## 2017-09-25 DIAGNOSIS — R0683 Snoring: Secondary | ICD-10-CM

## 2017-09-25 DIAGNOSIS — Z5181 Encounter for therapeutic drug level monitoring: Secondary | ICD-10-CM

## 2017-09-25 DIAGNOSIS — F17210 Nicotine dependence, cigarettes, uncomplicated: Secondary | ICD-10-CM

## 2017-09-25 DIAGNOSIS — E119 Type 2 diabetes mellitus without complications: Secondary | ICD-10-CM | POA: Diagnosis not present

## 2017-09-25 DIAGNOSIS — F17200 Nicotine dependence, unspecified, uncomplicated: Secondary | ICD-10-CM

## 2017-09-25 DIAGNOSIS — Z23 Encounter for immunization: Secondary | ICD-10-CM

## 2017-09-25 DIAGNOSIS — D751 Secondary polycythemia: Secondary | ICD-10-CM | POA: Diagnosis not present

## 2017-09-25 DIAGNOSIS — E785 Hyperlipidemia, unspecified: Secondary | ICD-10-CM | POA: Diagnosis not present

## 2017-09-25 MED ORDER — DIAZEPAM 5 MG PO TABS: ORAL_TABLET | ORAL | 0 refills | Status: DC

## 2017-09-25 MED ORDER — OXYCODONE-ACETAMINOPHEN 10-325 MG PO TABS: 1.0000 | ORAL_TABLET | ORAL | 0 refills | Status: DC | PRN

## 2017-09-25 MED ORDER — ROSUVASTATIN CALCIUM 20 MG PO TABS: 10.0000 mg | ORAL_TABLET | Freq: Every day | ORAL | 1 refills | Status: DC

## 2017-09-25 NOTE — Progress Notes (Signed)
9/3/20198:23 AM  Scott [name] 04-18-1960, 57 y.o. male [id number]  Chief Complaint  Patient presents with  . Follow-up    discuss labs, adjusting of meds. weaning of meds    HPI:   Patient is a 58 y.o. male with past medical history significant for DM2, HLP, COPD and chronic pain who presents today for followup  Weaning off valium by 2.5 mg every 2 weeks Percocet decreased from  #180 to 150 per month, current MDD 75mg  He overall is doing well Tolerating wean, denies any ssx of withdrawal  + snores, PND Wears O2 at night, 2L  Unable to donate blood as he has COPD Has never had sleep study Sees pulm next week  His father passed away about 3 weeks ago, smoking some, coping ok, good family support   Lab Results  Component Value Date   WBC 7.6 09/19/2017   HGB 17.9 (H) 09/19/2017   HCT 53.2 (H) 09/19/2017   MCV 87 09/19/2017   PLT 288 09/19/2017    Lab Results  Component Value Date   HGBA1C 6.5 (H) 06/01/2013   Lab Results  Component Value Date   CHOL 208 (H) 09/19/2017   HDL 36 (L) 09/19/2017   LDLCALC 120 (H) 09/19/2017   TRIG 259 (H) 09/19/2017   CHOLHDL 5.8 (H) 09/19/2017    Fall Risk  09/25/2017 09/19/2017 08/28/2017  Falls in the past year? No No Yes  Number falls in past yr: - - 1  Injury with Fall? - - Yes  Comment - - KNEES     Depression screen [facility] 2/9 09/25/2017 09/19/2017 08/28/2017  Decreased Interest 0 0 3  Down, Depressed, Hopeless 0 0 3  PHQ - 2 Score 0 0 6  Altered sleeping - - 3  Tired, decreased energy - - 2  Change in appetite - - 2  Feeling bad or failure about yourself  - - 2  Trouble concentrating - - 3  Moving slowly or fidgety/restless - - 0  Suicidal thoughts - - 0  PHQ-9 Score - - 18    Allergies  Allergen Reactions  . Penicillins Anaphylaxis    Has patient had a PCN reaction causing immediate rash, facial/tongue/throat swelling, SOB or lightheadedness with hypotension: Yes Has patient had a PCN reaction causing severe rash  involving mucus membranes or skin necrosis: Yes Has patient had a PCN reaction that required hospitalization: Yes Has patient had a PCN reaction occurring within the last 10 years: No If all of the above answers are "NO", then may proceed with Cephalosporin use.     Prior to Admission medications   Medication Sig Start Date End Date Taking? Authorizing Provider  Acetaminophen-Aspirin Buffered (EXCEDRIN BACK & BODY PO) Take 1 tablet by mouth daily as needed.   Yes [provider]  buPROPion (WELLBUTRIN SR) 150 MG 12 hr tablet Take 1 tablet (150 mg total) by mouth 2 (two) times daily. 05/30/16  Yes Byrum, Les Pou, MD  COMBIVENT RESPIMAT 20-100 MCG/ACT AERS respimat INHALE 1 PUFF BY MOUTH INTO THE LUNGS FOUR TIMES A DAY 04/27/17  Yes Byrum, Les Pou, MD  cyclobenzaprine (FLEXERIL) 5 MG tablet Take 5 mg by mouth 3 (three) times daily as needed for muscle spasms.   Yes [provider]  diazepam (VALIUM) 5 MG tablet Take 5mg  (1 tablet) every morning and night, take 2.5mg  (1/2 tablet) every afternoon 09/19/17  Yes Myles Lipps, MD  Fluticasone-Salmeterol (ADVAIR) 250-50 MCG/DOSE AEPB Inhale 1 puff into the lungs 2 (  two) times daily.   Yes [provider]  ipratropium-albuterol (DUONEB) 0.5-2.5 (3) MG/3ML SOLN INHALE 1 VIAL VIA NEBULIZER EVERY 6 HOURS AS DIRECTED FOR SHORTNESS OF BREATH OR WHEEZING. 05/29/16  Yes Leslye Peer, MD  omeprazole (PRILOSEC) 40 MG capsule TAKE 1 CAPSULE BY MOUTH EVERY DAY 05/29/16  Yes Byrum, Les Pou, MD  oxyCODONE-acetaminophen (PERCOCET) 10-325 MG tablet Take 1 tablet by mouth every 8 (eight) hours as needed for pain. 08/28/17  Yes Sagardia, Eilleen Kempf, MD  rosuvastatin (CRESTOR) 10 MG tablet Take 10 mg by mouth daily.   Yes [provider]  sitaGLIPtin-metformin (JANUMET) 50-500 MG per tablet Take 1 tablet by mouth daily.   Yes [provider]  VENTOLIN HFA 108 (90 Base) MCG/ACT inhaler INHALE 1 TO 2 PUFFS BY MOUTH EVERY 6 HOURS  AS NEEDED FOR WHEEZING OR SHORTNESS OF BREATH 09/07/17  Yes Leslye Peer, MD    Past Medical History:  Diagnosis Date  . 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 COPD (chronic obstructive pulmonary disease) (HCC)   . Diabetes mellitus without complication (HCC)   . GERD (gastroesophageal reflux disease)   . Headache(784.0)   . Pneumonia    hx    Past Surgical History:  Procedure Laterality Date  . ANTERIOR CERVICAL DECOMP/DISCECTOMY FUSION  12/22/2010   Procedure: ANTERIOR CERVICAL DECOMPRESSION/DISCECTOMY FUSION 3 LEVELS;  Surgeon: Cristi Loron;  Location: [facility];  Service: Neurosurgery;  Laterality: N/A;  Anterior cervical discectomy with fusion cervical three-four, four-five, and five sixCDF with Interbody Prosthesis, plating, and Bone Graft   . ANTERIOR CERVICAL DECOMP/DISCECTOMY FUSION N/A 10/16/2012   Procedure: CERVICAL SIX-SEVEN ANTERIOR CERVICAL DECOMPRESSION/DISCECTOMY FUSION WITH INTERBODY PROTHESIS PLATING BONEGRAFT WITH /POSSIBLE HARDWARE REMOVAL OLD PLATE;  Surgeon: Cristi Loron, MD;  Location: [facility];  Service: Neurosurgery;  Laterality: N/A;  . MULTIPLE TOOTH EXTRACTIONS    . OTHER SURGICAL HISTORY     surgery on cheekbone and head for fall 2000  . 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 SPINE SURGERY     2014 and 2016 - Dr Lovell Sheehan  . TONSILLECTOMY      Social History   Tobacco Use  . Smoking status: Former Smoker    Packs/day: 0.25    Years: 41.00    Pack years: 10.25    Types: Cigarettes    Start date: 03/17/2012    Last attempt to quit: 01/22/2016    Years since quitting: 1.6  . Smokeless tobacco: Never Used  Substance Use Topics  . Alcohol use: No    Alcohol/week: 0.0 standard drinks    Family History  Problem Relation Age of Onset  . Emphysema Mother   . COPD Mother   . Asthma Brother   . Diabetes  Brother   . Hyperlipidemia Brother   . Hypertension Brother   . Asthma Sister   . Hyperlipidemia Sister   . Hypertension Sister   . Mental illness Sister   . Heart disease Maternal Uncle   . Hypertension Brother     Review of Systems  Constitutional: Negative for chills and fever.  Respiratory: Negative for cough and shortness of breath.   Cardiovascular: Negative for chest pain, palpitations and leg swelling.  Gastrointestinal: Negative for abdominal pain, nausea and vomiting.     OBJECTIVE:  Blood pressure 133/80, pulse 60, temperature 98.3 F (36.8 C), temperature source Oral, height 5\' 8"  (1.727 m), weight 209 lb 6.4 oz (95 kg), SpO2 90 %. Body mass index is 31.84 kg/m.   Physical Exam  Constitutional: He is oriented to person, place, and time. He appears well-developed and well-nourished.  HENT:  Head: Normocephalic and atraumatic.  Mouth/Throat: Oropharynx is clear and moist.  Eyes: Pupils are equal, round, and reactive to light. Conjunctivae and EOM are normal.  Neck: Neck supple.  Cardiovascular: Normal rate and regular rhythm. Exam reveals no gallop and no friction rub.  No murmur heard. Pulmonary/Chest: Effort normal. He has wheezes. He has no rales.  Musculoskeletal: He exhibits no edema.  Neurological: He is alert and oriented to person, place, and time.  Skin: Skin is warm and dry.  Psychiatric: He has a normal mood and affect.  Nursing note and vitals reviewed.    ASSESSMENT and PLAN  1. Diabetes mellitus without complication (HCC) Controlled. Continue current regime. Pneumococcal vaccine given today.  - Microalbumin/Creatinine Ratio, Urine - Ambulatory referral to Ophthalmology  2. Chronic pain syndrome 2/2 DDD s/p multiple back surgeries. Weaning off bzd.  UDS done: today [place] CSR reviewed: today CSA signed: today  3. Encounter for therapeutic drug monitoring - ToxASSURE Select 13 (MW), Urine  4. Hyperlipidemia, unspecified hyperlipidemia  type Not at [facility], increasing crestor, recheck at next visit  5. Polycythemia In setting of smoking and + snoring with PND, most likely 2/2 OSA at this time. Referring for sleep study - Ambulatory referral to Sleep Studies  6. Snoring See #5 - Ambulatory referral to Sleep Studies  7. Tobacco use disorder Smoking cessation instruction/counseling given for 3 - 10 min:  counseled patient on the dangers of tobacco use, advised patient to stop smoking, and reviewed strategies to maximize success   Other orders - rosuvastatin (CRESTOR) 20 MG tablet; Take 0.5 tablets (10 mg total) by mouth daily. - diazepam (VALIUM) 5 MG tablet; Take 5mg  (1 tablet) in the morning and 2.5mg  (1/2 tablet) in the afternoon and evening - oxyCODONE-acetaminophen (PERCOCET) 10-325 MG tablet; Take 1 tablet by mouth every 4 (four) hours as needed for pain. - Pneumococcal polysaccharide vaccine 23-valent greater than or equal to 2yo subcutaneous/IM  Return in about 4 weeks (around 10/23/2017).    Myles Lipps, MD Primary Care at [facility] [address] [place], [place] [address] Ph.  [phone] Fax [phone]

## 2017-09-25 NOTE — Patient Instructions (Signed)
° ° ° °  If you have lab work done today you will be contacted with your lab results within the next 2 weeks.  If you have not heard from us then please contact us. The fastest way to get your results is to register for My Chart. ° ° °IF you received an x-ray today, you will receive an invoice from [facility]. Please contact [facility] at [phone] with questions or concerns regarding your invoice.  ° °IF you received labwork today, you will receive an invoice from [facility]. Please contact [facility] at [phone] with questions or concerns regarding your invoice.  ° °Our billing staff will not be able to assist you with questions regarding bills from these companies. ° °You will be contacted with the lab results as soon as they are available. The fastest way to get your results is to activate your My Chart account. Instructions are located on the last page of this paperwork. If you have not heard from us regarding the results in 2 weeks, please contact this office. °  ° ° ° °

## 2017-09-26 LAB — MICROALBUMIN / CREATININE URINE RATIO
Creatinine, Urine: 27 mg/dL
Microalb/Creat Ratio: <11.1 mg/g creat (ref 0.0–30.0)
Microalbumin, Urine: <3 ug/mL

## 2017-09-27 LAB — TOXASSURE SELECT 13 (MW), URINE

## 2017-09-28 LAB — TOXASSURE SELECT 13 (MW), URINE

## 2017-10-01 ENCOUNTER — Ambulatory Visit: Payer: Self-pay | Admitting: Emergency Medicine

## 2017-10-03 ENCOUNTER — Ambulatory Visit: Payer: Medicare HMO | Admitting: Student in an Organized Health Care Education/Training Program

## 2017-10-04 LAB — HM DIABETES EYE EXAM

## 2017-10-05 ENCOUNTER — Encounter: Payer: Self-pay | Admitting: Emergency Medicine

## 2017-10-05 ENCOUNTER — Ambulatory Visit (INDEPENDENT_AMBULATORY_CARE_PROVIDER_SITE_OTHER): Payer: Medicare HMO | Admitting: Emergency Medicine

## 2017-10-05 DIAGNOSIS — Z23 Encounter for immunization: Secondary | ICD-10-CM

## 2017-10-05 DIAGNOSIS — R0902 Hypoxemia: Secondary | ICD-10-CM

## 2017-10-05 DIAGNOSIS — K219 Gastro-esophageal reflux disease without esophagitis: Secondary | ICD-10-CM | POA: Diagnosis not present

## 2017-10-05 DIAGNOSIS — J449 Chronic obstructive pulmonary disease, unspecified: Secondary | ICD-10-CM

## 2017-10-05 DIAGNOSIS — F17200 Nicotine dependence, unspecified, uncomplicated: Secondary | ICD-10-CM | POA: Diagnosis not present

## 2017-10-05 MED ORDER — FLUTICASONE-SALMETEROL 250-50 MCG/DOSE IN AEPB: 1.0000 | INHALATION_SPRAY | Freq: Two times a day (BID) | RESPIRATORY_TRACT | 5 refills | Status: DC

## 2017-10-05 NOTE — Assessment & Plan Note (Signed)
We will refill his omeprazole

## 2017-10-05 NOTE — Patient Instructions (Addendum)
We will work on getting you back to the name brand formulation of your Advair.  Use this 1 puff twice a day.  Rinse and gargle after using. Continue your Combivent 2 puffs 4 times a day. Keep DuoNeb available to use if needed for shortness of breath, wheezing, chest tightness. Your Pneumovax is up-to-date.  You will need the Prevnar-13 pneumonia shot next year. Get the flu shot today. Walking oximetry on room air today shows that your oxygen level drops with walking. You need to go back to using your oxygen at 2L/min with long walks or heavy exertion.  Continue your omeprazole.  We will refill this for you today. Follow with Dr Delton Coombes in 6 months or sooner if you have any problems

## 2017-10-05 NOTE — Progress Notes (Signed)
Subjective:    Patient ID: Scott [name], male    DOB: 12-30-1960, 57 y.o.   MRN: [id number]  HPI  ROV 09/13/15 -- follow up visit for tobacco use, COPD, Exertional hypoxemia. He is smoking 1-2 cig a day. He wants to quit altogether, has tried patches before, may want to try try gum. He is fearful of Chantix side effects. His breathing has been about the same. He coughs up mucous daily. Has nasal congestion. He is on Advair + scheduled combivent. flonase qd. Was treated with abx for a sinusitis since last time, no pred given. Requests cough syrup  ROV 03/09/16 -- Scott [name] has a history of COPD, exertional hypoxemia and tobacco - he reports that he stopped smoking on 01/22/16. He reports that he has had more trouble since our last visit. More exertional SOB, chest tightness. He does not exert much - can exert for about 10 minutes. Has not been rx with pred or abx since last time. He is still on wellbutrin, wants to continue it for mood reasons. He uses ventolin 4-5x a day.   ROV 11/17/16 --this is a follow-up visit for patient with a history of tobacco, COPD, exertional hypoxemia. He remains off cigarettes.  At our last visit we continued him on scheduled Combivent, scheduled Advair. He is using combivent extra some times. Uses albuterol extra as well. We repeated his pulmonary function testing today and I have reviewed.  This shows severe obstruction (FEV1 1.39 L, 45% predicted following BD) without a significant bronchodilator response (borderline response), normal lung volumes consistent with possible superimposed restriction, a decreased diffusion capacity that does not fully correct when adjusted for his alveolar volume.  Compared with previous spirometry done 12/05/11 his FEV1 is stable from 1.61 L (43% predicted).  He is not using any oxygen. He is having SOB, happens after eating, has to walk slowly. He has cough, wheeze.  He believes that he has an early upper respiratory infection, wants to avoid  flu shot today.  ROV 03/19/17 --patient has a history of COPD with severe obstruction by spirometry, exertional hypoxemia . We did a trial of Trelegy in October '18. He developed tongue swelling, swallowing difficulty, ? Some thrush after about 3 weeks. He went back to Advair + combivent. He is back to smoking 5 cigarettes daily. He is participating in a study at [facility].   ROV 10/05/17 --Scott [name] is a 57 year old smoker with a history of severe obstruction by spirometry, COPD with occasional exacerbations, associated exertional hypoxemia.  Unfortunately back to cigarettes after having stopped for many months, smokes 3 cig a day.  He has been managed with Advair discus, notes that it was recently changed to the generic formulation.  He does not feel that this controls his symptoms as well. He is having more dyspnea, has tried using the generic more than BID. He is having cough, clear sputum. Still on combivent qid. Needs refill omeprazole. He uses DuoNeb qid. He had pneumovax on 09/25/17. No overt flares since last visit. He did not wear his O2 today, is using at night.     No flowsheet data found.  Objective:   Physical Exam  Vitals:   10/05/17 0904  BP: 134/84  Pulse: (!) 124  SpO2: 90%  Weight: 212 lb (96.2 kg)  Height: 5\' 8"  (1.727 m)   Gen: Pleasant, well-nourished, in no distress  ENT: No lesions,  mouth clear,  oropharynx clear, no postnasal drip,   Neck: No JVD, no TMG, no  carotid bruits  Lungs: mostly clear, no wheezes  Cardiovascular: RRR, heart sounds normal, no murmur or gallops, no peripheral edema  Musculoskeletal: No deformities, no cyanosis or clubbing  Neuro: alert, appears to be more sedate today, less energy. Non-focal.   Skin: Warm     Assessment & Plan:  Hypoxia He does not have his oxygen with him today, has only been using at night.  The last oximetry I see was done on 2 L/min to confirm adequate oxygenation with ambulation, done October 2018.  He needs a  repeat oximetry to confirm that he desaturates and then we will put him back on 2 L/min with all exertion.  COPD (chronic obstructive pulmonary disease) Less adequate control today.  Probably in part due to the change in his Advair which she does not feel is working as well as the [place] [employer].  Also he is back to smoking.  Discussed cessation with him today.  He had the Pneumovax on 09/25/2017, will need the Prevnar next year.  He needs a flu shot today.  I will work on getting him namebrand Advair, continue his Combivent on a schedule.  He has DuoNeb to use as needed.  Tobacco use disorder Discussed cessation with him today.  GERD (gastroesophageal reflux disease) We will refill his omeprazole   Levy Pupa, MD, PhD 10/05/2017, 9:22 AM [facility] [phone] or if no answer [phone]

## 2017-10-05 NOTE — Assessment & Plan Note (Signed)
He does not have his oxygen with him today, has only been using at night.  The last oximetry I see was done on 2 L/min to confirm adequate oxygenation with ambulation, done October 2018.  He needs a repeat oximetry to confirm that he desaturates and then we will put him back on 2 L/min with all exertion.

## 2017-10-05 NOTE — Assessment & Plan Note (Signed)
Less adequate control today.  Probably in part due to the change in his Advair which she does not feel is working as well as the [place] [employer].  Also he is back to smoking.  Discussed cessation with him today.  He had the Pneumovax on 09/25/2017, will need the Prevnar next year.  He needs a flu shot today.  I will work on getting him namebrand Advair, continue his Combivent on a schedule.  He has DuoNeb to use as needed.

## 2017-10-05 NOTE — Assessment & Plan Note (Signed)
Discussed cessation with him today 

## 2017-10-08 ENCOUNTER — Telehealth: Payer: Self-pay | Admitting: Family Medicine

## 2017-10-08 NOTE — Telephone Encounter (Signed)
Copied from CRM [id number]. Topic: Quick Communication - Rx Refill/Question >> Oct 08, 2017 10:44 AM Gaynelle Adu wrote: Medication: diazepam (VALIUM) 5 MG tablet  Has the patient contacted their pharmacy? yes  Preferred Pharmacy (with phone number or street name): [id number] - [place], [place] - [address] AT [facility] OF [address] & [address] [phone] (Phone) [phone] (Fax)  Agent: Please be advised that RX refills may take up to 3 business days. We ask that you follow-up with your pharmacy.

## 2017-10-09 NOTE — Telephone Encounter (Signed)
Please see note below and refill if possible  

## 2017-10-10 ENCOUNTER — Other Ambulatory Visit: Payer: Self-pay | Admitting: Family Medicine

## 2017-10-10 MED ORDER — DIAZEPAM 5 MG PO TABS: 2.5000 mg | ORAL_TABLET | Freq: Three times a day (TID) | ORAL | 0 refills | Status: DC

## 2017-10-10 NOTE — Telephone Encounter (Signed)
pmp reviewed New rx sent, cont wean, now down to 2.5mg  TID Has appt on 10/3

## 2017-10-10 NOTE — Telephone Encounter (Signed)
Diazepam 5 mg refill Last Refill:09/25/17 # 28 Last OV: 09/25/17 PCP: Leretha Pol Pharmacy:[facility]

## 2017-10-10 NOTE — Telephone Encounter (Signed)
Patient called very upset because he still have not received his refill on diazepam (VALIUM) 5 MG tablet. Stated that he will show up at the office to get his Rx from Dr Leretha Pol. Please advise

## 2017-10-12 NOTE — Telephone Encounter (Signed)
pmp reviewed He picked up rx sent 10/10/17

## 2017-10-23 ENCOUNTER — Other Ambulatory Visit: Payer: Self-pay | Admitting: Emergency Medicine

## 2017-10-25 ENCOUNTER — Encounter: Payer: Self-pay | Admitting: Family Medicine

## 2017-10-25 ENCOUNTER — Ambulatory Visit (INDEPENDENT_AMBULATORY_CARE_PROVIDER_SITE_OTHER): Payer: Medicare HMO | Admitting: Family Medicine

## 2017-10-25 ENCOUNTER — Other Ambulatory Visit: Payer: Self-pay

## 2017-10-25 VITALS — BP 138/82 | HR 100 | Temp 98.7°F | Ht 68.0 in | Wt 208.4 lb

## 2017-10-25 DIAGNOSIS — G894 Chronic pain syndrome: Secondary | ICD-10-CM | POA: Diagnosis not present

## 2017-10-25 DIAGNOSIS — M5137 Other intervertebral disc degeneration, lumbosacral region: Secondary | ICD-10-CM

## 2017-10-25 DIAGNOSIS — E785 Hyperlipidemia, unspecified: Secondary | ICD-10-CM

## 2017-10-25 DIAGNOSIS — E119 Type 2 diabetes mellitus without complications: Secondary | ICD-10-CM

## 2017-10-25 DIAGNOSIS — Z5181 Encounter for therapeutic drug level monitoring: Secondary | ICD-10-CM

## 2017-10-25 DIAGNOSIS — E559 Vitamin D deficiency, unspecified: Secondary | ICD-10-CM

## 2017-10-25 MED ORDER — GABAPENTIN 300 MG PO CAPS: 300.0000 mg | ORAL_CAPSULE | Freq: Three times a day (TID) | ORAL | 3 refills | Status: DC

## 2017-10-25 MED ORDER — DIAZEPAM 5 MG PO TABS: 2.5000 mg | ORAL_TABLET | Freq: Three times a day (TID) | ORAL | 0 refills | Status: DC

## 2017-10-25 MED ORDER — OMEPRAZOLE 40 MG PO CPDR: DELAYED_RELEASE_CAPSULE | ORAL | 6 refills | Status: DC

## 2017-10-25 MED ORDER — CYCLOBENZAPRINE HCL 10 MG PO TABS: 10.0000 mg | ORAL_TABLET | Freq: Three times a day (TID) | ORAL | 5 refills | Status: DC | PRN

## 2017-10-25 MED ORDER — NICOTINE POLACRILEX 2 MG MT GUM: 2.0000 mg | CHEWING_GUM | OROMUCOSAL | 3 refills | Status: DC | PRN

## 2017-10-25 MED ORDER — HYDROCHLOROTHIAZIDE 12.5 MG PO TABS: 12.5000 mg | ORAL_TABLET | Freq: Every day | ORAL | 3 refills | Status: DC

## 2017-10-25 MED ORDER — OXYCODONE-ACETAMINOPHEN 10-325 MG PO TABS: 1.0000 | ORAL_TABLET | ORAL | 0 refills | Status: DC | PRN

## 2017-10-25 NOTE — Patient Instructions (Signed)
     If you have lab work done today you will be contacted with your lab results within the next 2 weeks.  If you have not heard from us then please contact us. The fastest way to get your results is to register for My Chart.   IF you received an x-ray today, you will receive an invoice from [facility]. Please contact [facility] at [phone] with questions or concerns regarding your invoice.   IF you received labwork today, you will receive an invoice from [facility]. Please contact [facility] at [phone] with questions or concerns regarding your invoice.   Our billing staff will not be able to assist you with questions regarding bills from these companies.  You will be contacted with the lab results as soon as they are available. The fastest way to get your results is to activate your My Chart account. Instructions are located on the last page of this paperwork. If you have not heard from us regarding the results in 2 weeks, please contact this office.        If you have lab work done today you will be contacted with your lab results within the next 2 weeks.  If you have not heard from us then please contact us. The fastest way to get your results is to register for My Chart.   IF you received an x-ray today, you will receive an invoice from [facility]. Please contact [facility] at [phone] with questions or concerns regarding your invoice.   IF you received labwork today, you will receive an invoice from [facility]. Please contact [facility] at [phone] with questions or concerns regarding your invoice.   Our billing staff will not be able to assist you with questions regarding bills from these companies.  You will be contacted with the lab results as soon as they are available. The fastest way to get your results is to activate your My Chart account. Instructions are located on the last page of this paperwork. If you have not heard from us  regarding the results in 2 weeks, please contact this office.     

## 2017-10-25 NOTE — Progress Notes (Signed)
10/3/20199:23 AM  Scott [name] [birth year], 57 y.o. male [id number]  Chief Complaint  Patient presents with  . Hypertension  . Medication Refill    OXYCODONE AND VALIUM    HPI:   Patient is a 57 y.o. male with past medical history significant for DM2, HTN HLP, COPD and chronic pain who presents today for followup   Has seen pulm - last visit about a week ago, got flu vaccine, trying to get back on brandname advair, sleeping with 2L at night - really drying  Increased crestor last visit  Referred to sleep, sees them on oct 30th  Weaning of valium Did not go down as instructed last time which why he ran out early Has been without meds valium and percocet since day before yesterday Having mild withdrawal sx  Smoking < 5 cig a day, thinking about gum, on wellbutrin  Has been taking vitamiin d 65465 units a week, not sure if he still needs it, has not taken for about 2 weeks  Lab Results  Component Value Date   HGBA1C 6.5 (H) 06/01/2013   Lab Results  Component Value Date   LDLCALC 120 (H) 09/19/2017   CREATININE 1.26 09/19/2017    Fall Risk  10/25/2017 09/25/2017 09/19/2017 08/28/2017  Falls in the past year? No No No Yes  Number falls in past yr: - - - 1  Injury with Fall? - - - Yes  Comment - - - KNEES     Depression screen [facility] 2/9 10/25/2017 09/25/2017 09/19/2017  Decreased Interest 0 0 0  Down, Depressed, Hopeless 0 0 0  PHQ - 2 Score 0 0 0  Altered sleeping - - -  Tired, decreased energy - - -  Change in appetite - - -  Feeling bad or failure about yourself  - - -  Trouble concentrating - - -  Moving slowly or fidgety/restless - - -  Suicidal thoughts - - -  PHQ-9 Score - - -    Allergies  Allergen Reactions  . Penicillins Anaphylaxis    Has patient had a PCN reaction causing immediate rash, facial/tongue/throat swelling, SOB or lightheadedness with hypotension: Yes Has patient had a PCN reaction causing severe rash involving mucus membranes or skin  necrosis: Yes Has patient had a PCN reaction that required hospitalization: Yes Has patient had a PCN reaction occurring within the last 10 years: No If all of the above answers are "NO", then may proceed with Cephalosporin use.     Prior to Admission medications   Medication Sig Start Date End Date Taking? Authorizing Provider  Acetaminophen-Aspirin Buffered (EXCEDRIN BACK & BODY PO) Take 1 tablet by mouth daily as needed.   Yes [provider]  buPROPion (WELLBUTRIN SR) 150 MG 12 hr tablet Take 1 tablet (150 mg total) by mouth 2 (two) times daily. 05/30/16  Yes Byrum, Les Pou, MD  COMBIVENT RESPIMAT 20-100 MCG/ACT AERS respimat INHALE 1 PUFF BY MOUTH INTO THE LUNGS FOUR TIMES A DAY 04/27/17  Yes Byrum, Les Pou, MD  cyclobenzaprine (FLEXERIL) 5 MG tablet Take 5 mg by mouth 3 (three) times daily as needed for muscle spasms.   Yes [provider]  diazepam (VALIUM) 5 MG tablet Take 0.5 tablets (2.5 mg total) by mouth 3 (three) times daily. 10/10/17  Yes Myles Lipps, MD  Fluticasone-Salmeterol (ADVAIR) 250-50 MCG/DOSE AEPB Inhale 1 puff into the lungs 2 (two) times daily. 10/05/17  Yes Leslye Peer, MD  ipratropium-albuterol (DUONEB) 0.5-2.5 (3) MG/3ML SOLN  INHALE 1 VIAL VIA NEBULIZER EVERY 6 HOURS AS DIRECTED FOR SHORTNESS OF BREATH OR WHEEZING. 05/29/16  Yes Byrum, Les Pou, MD  ipratropium-albuterol (DUONEB) 0.5-2.5 (3) MG/3ML SOLN INHALE THE CONTENTS OF 1 VIAL VIA NEBULIZER EVERY 6 HOURS AS DIRECTED FOR SHORTNESS OF BREATH OR WHEEZING 10/24/17  Yes Byrum, Les Pou, MD  omeprazole (PRILOSEC) 40 MG capsule TAKE 1 CAPSULE BY MOUTH EVERY DAY 05/29/16  Yes Leslye Peer, MD  oxyCODONE-acetaminophen (PERCOCET) 10-325 MG tablet Take 1 tablet by mouth every 4 (four) hours as needed for pain. 09/25/17  Yes Myles Lipps, MD  rosuvastatin (CRESTOR) 20 MG tablet Take 0.5 tablets (10 mg total) by mouth daily. 09/25/17  Yes Myles Lipps, MD  sitaGLIPtin-metformin (JANUMET) 50-500 MG  per tablet Take 1 tablet by mouth daily.   Yes [provider]  VENTOLIN HFA 108 (90 Base) MCG/ACT inhaler INHALE 1 TO 2 PUFFS BY MOUTH EVERY 6 HOURS AS NEEDED FOR WHEEZING OR SHORTNESS OF BREATH 09/07/17  Yes Leslye Peer, MD    Past Medical History:  Diagnosis Date  . 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 COPD (chronic obstructive pulmonary disease) (HCC)   . Diabetes mellitus without complication (HCC)   . GERD (gastroesophageal reflux disease)   . Headache(784.0)   . Pneumonia    hx    Past Surgical History:  Procedure Laterality Date  . ANTERIOR CERVICAL DECOMP/DISCECTOMY FUSION  12/22/2010   Procedure: ANTERIOR CERVICAL DECOMPRESSION/DISCECTOMY FUSION 3 LEVELS;  Surgeon: Cristi Loron;  Location: [facility];  Service: Neurosurgery;  Laterality: N/A;  Anterior cervical discectomy with fusion cervical three-four, four-five, and five sixCDF with Interbody Prosthesis, plating, and Bone Graft   . ANTERIOR CERVICAL DECOMP/DISCECTOMY FUSION N/A 10/16/2012   Procedure: CERVICAL SIX-SEVEN ANTERIOR CERVICAL DECOMPRESSION/DISCECTOMY FUSION WITH INTERBODY PROTHESIS PLATING BONEGRAFT WITH /POSSIBLE HARDWARE REMOVAL OLD PLATE;  Surgeon: Cristi Loron, MD;  Location: [facility];  Service: Neurosurgery;  Laterality: N/A;  . MULTIPLE TOOTH EXTRACTIONS    . OTHER SURGICAL HISTORY     surgery on cheekbone and head for fall 2000  . 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 SPINE SURGERY     2014 and 2016 - Dr Lovell Sheehan  . TONSILLECTOMY      Social History   Tobacco Use  . Smoking status: Former Smoker    Packs/day: 0.25    Years: 41.00    Pack years: 10.25    Types: Cigarettes    Start date: 03/17/2012    Last attempt to quit: 01/22/2016    Years since quitting: 1.7  . Smokeless tobacco: Never Used  Substance Use Topics  . Alcohol use: No     Alcohol/week: 0.0 standard drinks    Family History  Problem Relation Age of Onset  . Emphysema Mother   . COPD Mother   . Asthma Brother   . Diabetes Brother   . Hyperlipidemia Brother   . Hypertension Brother   . Asthma Sister   . Hyperlipidemia Sister   . Hypertension Sister   . Mental illness Sister   . Heart disease Maternal Uncle   . Hypertension Brother     Review of Systems  Constitutional: Negative for chills and fever.  Respiratory: Negative for cough and shortness of breath.   Cardiovascular: Negative for  chest pain, palpitations and leg swelling.  Gastrointestinal: Positive for nausea. Negative for abdominal pain and vomiting.  Musculoskeletal: Positive for myalgias.  Psychiatric/Behavioral: The patient is nervous/anxious.      OBJECTIVE:  Blood pressure 138/82, pulse 100, temperature 98.7 F (37.1 C), temperature source Oral, height 5\' 8"  (1.727 m), weight 208 lb 6.4 oz (94.5 kg), SpO2 91 %. Body mass index is 31.69 kg/m.   Physical Exam  Constitutional: He is oriented to person, place, and time. He appears well-developed and well-nourished.  HENT:  Head: Normocephalic and atraumatic.  Mouth/Throat: Oropharynx is clear and moist.  Eyes: Pupils are equal, round, and reactive to light. Conjunctivae and EOM are normal.  Neck: Neck supple.  Cardiovascular: Normal rate and regular rhythm. Exam reveals no gallop and no friction rub.  No murmur heard. Pulmonary/Chest: Effort normal. He has wheezes. He has no rales.  Musculoskeletal: He exhibits no edema.  Neurological: He is alert and oriented to person, place, and time.  Skin: Skin is warm and dry.  Psychiatric: He has a normal mood and affect.  Nursing note and vitals reviewed.   ASSESSMENT and PLAN  1. Diabetes mellitus without complication (HCC) Stable. Check a1c at next visit  2. Chronic pain syndrome pmp reviewed today. Stable on current dose. Refilled percocet. Continue to wean from valium.  Clarified once again our intent and plan moving forward. Patient can call in 2 weeks for refills of valium. Next rx will be for 1/2 tab BID - ToxASSURE Select 13 (MW), Urine  3. Encounter for therapeutic drug monitoring - ToxASSURE Select 13 (MW), Urine  4. Hyperlipidemia, unspecified hyperlipidemia type Checking labs today, medications will be adjusted as needed.  - cmp - lipid  5. Degeneration of lumbar or lumbosacral intervertebral disc - ToxASSURE Select 13 (MW), Urine   6. Vitamin d deficiency Checking labs today, medications will be adjusted as needed.   Return in about 4 weeks (around 11/22/2017) for chronic medical conditions.    Myles Lipps, MD Primary Care at [facility] [address] [place], [place] [address] Ph.  [phone] Fax [phone]

## 2017-10-26 LAB — COMPREHENSIVE METABOLIC PANEL
ALT: 18 IU/L (ref 0–44)
AST: 23 IU/L (ref 0–40)
Albumin/Globulin Ratio: 1.8 (ref 1.2–2.2)
Albumin: 4.4 g/dL (ref 3.5–5.5)
Alkaline Phosphatase: 94 IU/L (ref 39–117)
BUN/Creatinine Ratio: 9 (ref 9–20)
BUN: 12 mg/dL (ref 6–24)
Bilirubin Total: 1.4 mg/dL — ABNORMAL HIGH (ref 0.0–1.2)
CO2: 21 mmol/L (ref 20–29)
Calcium: 9.3 mg/dL (ref 8.7–10.2)
Chloride: 105 mmol/L (ref 96–106)
Creatinine, Ser: 1.27 mg/dL (ref 0.76–1.27)
GFR calc Af Amer: 73 mL/min/{1.73_m2} (ref >59)
GFR calc non Af Amer: 63 mL/min/{1.73_m2} (ref >59)
Globulin, Total: 2.4 g/dL (ref 1.5–4.5)
Glucose: 128 mg/dL — ABNORMAL HIGH (ref 65–99)
Potassium: 4.2 mmol/L (ref 3.5–5.2)
Sodium: 143 mmol/L (ref 134–144)
Total Protein: 6.8 g/dL (ref 6.0–8.5)

## 2017-10-26 LAB — VITAMIN D 25 HYDROXY (VIT D DEFICIENCY, FRACTURES): Vit D, 25-Hydroxy: 18 ng/mL — ABNORMAL LOW (ref 30.0–100.0)

## 2017-10-26 LAB — LIPID PANEL
Chol/HDL Ratio: 4.1 ratio (ref 0.0–5.0)
Cholesterol, Total: 154 mg/dL (ref 100–199)
HDL: 38 mg/dL — ABNORMAL LOW (ref >39)
LDL Calculated: 74 mg/dL (ref 0–99)
Triglycerides: 209 mg/dL — ABNORMAL HIGH (ref 0–149)
VLDL Cholesterol Cal: 42 mg/dL — ABNORMAL HIGH (ref 5–40)

## 2017-10-27 MED ORDER — VITAMIN D (ERGOCALCIFEROL) 1.25 MG (50000 UNIT) PO CAPS: 50000.0000 [IU] | ORAL_CAPSULE | ORAL | 0 refills | Status: DC

## 2017-10-27 NOTE — Addendum Note (Signed)
Addended by: Myles Lipps on: 10/27/2017 06:12 PM   Modules accepted: Orders

## 2017-10-31 LAB — TOXASSURE SELECT 13 (MW), URINE

## 2017-11-07 ENCOUNTER — Telehealth: Payer: Self-pay | Admitting: Family Medicine

## 2017-11-07 MED ORDER — DIAZEPAM 5 MG PO TABS: 2.5000 mg | ORAL_TABLET | Freq: Two times a day (BID) | ORAL | 0 refills | Status: AC

## 2017-11-07 NOTE — Telephone Encounter (Unsigned)
Copied from CRM [id number]. Topic: Quick Communication - Rx Refill/Question >> Nov 07, 2017 11:12 AM Percival Spanish wrote: Medication diazepam (VALIUM) 5 MG tablet    pt said Sunday he will be out   Has the patient contacted their pharmacy yes    Preferred Pharmacy  [facility]  Agent: Please be advised that RX refills may take up to 3 business days. We ask that you follow-up with your pharmacy.

## 2017-11-07 NOTE — Telephone Encounter (Signed)
pmp reviewed Med refilled with next weaning step, decreasing to 1/2 tab BID x 14 days

## 2017-11-08 NOTE — Telephone Encounter (Signed)
Spoke with pt via phone advised refilled sent to pharmacy with next step weaning, decreasing to 1/2 tab bid x 14 days, pt agreeable an advises he has enough until Sunday.  Appreciative of c/b.

## 2017-11-14 ENCOUNTER — Other Ambulatory Visit: Payer: Self-pay | Admitting: Family Medicine

## 2017-11-14 NOTE — Telephone Encounter (Signed)
Requested medication (s) are due for refill today -yes  Requested medication (s) are on the active medication list- yes/no  Future visit scheduled -no  Last refill: meloxicam- 06/21/17- patient will need a new Rx- this is not on current medication list                 Cyclobenzaprine 10 mg- 07/18/17- pharmacy did not receive the new Rx  Notes to clinic: Call to pharmacy- they looked at their records- they did not get the Rx sent in October- it was sent no print. Please review for resending the Rx  Requested Prescriptions  Pending Prescriptions Disp Refills   meloxicam (MOBIC) 15 MG tablet [Pharmacy Med Name: MELOXICAM 15 MG TAB] 10 tablet     Sig: TAKE 1 TABLET BY MOUTH EVERY DAY FOR 10 DAYS     Analgesics:  COX2 Inhibitors Failed - 11/14/2017  3:44 PM      Failed - HGB in normal range and within 360 days    Hemoglobin  Date Value Ref Range Status  09/19/2017 17.9 (H) 13.0 - 17.7 g/dL Final         Passed - Cr in normal range and within 360 days    Creatinine, Ser  Date Value Ref Range Status  10/25/2017 1.27 0.76 - 1.27 mg/dL Final         Passed - Patient is not pregnant      Passed - Valid encounter within last 12 months    Recent Outpatient Visits          2 weeks ago Diabetes mellitus without complication (HCC)   [facility], Meda Coffee, MD   1 month ago Diabetes mellitus without complication [facility])   [facility], Meda Coffee, MD   1 month ago Diabetes mellitus without complication [facility])   [facility], Meda Coffee, MD   2 months ago Diabetes mellitus without complication [facility])   Primary Care at [facility], Eilleen Kempf, MD      Future Appointments            In 1 week Myles Lipps, MD Primary [facility] at [place], [facility]          cyclobenzaprine (FLEXERIL) 10 MG tablet [Pharmacy Med Name: CYCLOBENZAPRINE 10MG  TAB] 30 tablet     Sig: TAKE 1 TABLET BY MOUTH THREE TIMES A DAY FOR 10 DAYS     Not Delegated - Analgesics:   Muscle Relaxants Failed - 11/14/2017  3:44 PM      Failed - This refill cannot be delegated      Passed - Valid encounter within last 6 months    Recent Outpatient Visits          2 weeks ago Diabetes mellitus without complication [facility])   [facility], Meda Coffee, MD   1 month ago Diabetes mellitus without complication [facility])   [facility], Meda Coffee, MD   1 month ago Diabetes mellitus without complication [facility])   [facility], Meda Coffee, MD   2 months ago Diabetes mellitus without complication [facility])   Primary Care at [facility], Eilleen Kempf, MD      Future Appointments            In 1 week Myles Lipps, MD Primary Care at [place], [facility]            Requested Prescriptions  Pending Prescriptions Disp Refills  meloxicam (MOBIC) 15 MG tablet [Pharmacy Med Name: MELOXICAM 15 MG TAB] 10 tablet     Sig: TAKE 1 TABLET BY MOUTH EVERY DAY FOR 10 DAYS     Analgesics:  COX2 Inhibitors Failed - 11/14/2017  3:44 PM      Failed - HGB in normal range and within 360 days    Hemoglobin  Date Value Ref Range Status  09/19/2017 17.9 (H) 13.0 - 17.7 g/dL Final         Passed - Cr in normal range and within 360 days    Creatinine, Ser  Date Value Ref Range Status  10/25/2017 1.27 0.76 - 1.27 mg/dL Final         Passed - Patient is not pregnant      Passed - Valid encounter within last 12 months    Recent Outpatient Visits          2 weeks ago Diabetes mellitus without complication (HCC)   [facility], Meda Coffee, MD   1 month ago Diabetes mellitus without complication [facility])   [facility], Meda Coffee, MD   1 month ago Diabetes mellitus without complication [facility])   [facility], Meda Coffee, MD   2 months ago Diabetes mellitus without complication [facility])   Primary Care at [facility], Eilleen Kempf, MD      Future Appointments            In 1 week Myles Lipps, MD Primary  Care at [place], [facility]          cyclobenzaprine (FLEXERIL) 10 MG tablet [Pharmacy Med Name: CYCLOBENZAPRINE 10MG  TAB] 30 tablet     Sig: TAKE 1 TABLET BY MOUTH THREE TIMES A DAY FOR 10 DAYS     Not Delegated - Analgesics:  Muscle Relaxants Failed - 11/14/2017  3:44 PM      Failed - This refill cannot be delegated      Passed - Valid encounter within last 6 months    Recent Outpatient Visits          2 weeks ago Diabetes mellitus without complication [facility])   [facility], Meda Coffee, MD   1 month ago Diabetes mellitus without complication [facility])   [facility], Meda Coffee, MD   1 month ago Diabetes mellitus without complication [facility])   [facility], Meda Coffee, MD   2 months ago Diabetes mellitus without complication [facility])   Primary Care at [facility], Eilleen Kempf, MD      Future Appointments            In 1 week Myles Lipps, MD Primary Care at [place], [facility]

## 2017-11-20 ENCOUNTER — Other Ambulatory Visit: Payer: Self-pay | Admitting: Family Medicine

## 2017-11-20 NOTE — Telephone Encounter (Signed)
Requested medication (s) are due for refill today: yes, but early. Pt requesting refill early so that he will have medication on time  Requested medication (s) are on the active medication list: yes  Last refill:  10/25/17 #150  Future visit scheduled: yes, 11/26/17  Notes to clinic:  LOV: 10/25/17    Requested Prescriptions  Pending Prescriptions Disp Refills   oxyCODONE-acetaminophen (PERCOCET) 10-325 MG tablet 150 tablet 0    Sig: Take 1 tablet by mouth every 4 (four) hours as needed for pain.     Not Delegated - Analgesics:  Opioid Agonist Combinations Failed - 11/20/2017  8:52 AM      Failed - This refill cannot be delegated      Failed - Urine Drug Screen completed in last 360 days.      Passed - Valid encounter within last 6 months    Recent Outpatient Visits          3 weeks ago Diabetes mellitus without complication [facility])   [facility] at [facility], Meda Coffee, MD   1 month ago Diabetes mellitus without complication [facility])   [facility] at [facility], Meda Coffee, MD   2 months ago Diabetes mellitus without complication [facility])   [facility] Care at [facility], Meda Coffee, MD   2 months ago Diabetes mellitus without complication [facility])   Primary Care at [facility], Eilleen Kempf, MD      Future Appointments            In 6 days Myles Lipps, MD Primary Care at [place], [facility]

## 2017-11-20 NOTE — Telephone Encounter (Signed)
p peCopied from CRM [id number]. Topic: General - Other >> Nov 20, 2017  8:27 AM Ronney Lion A wrote: Medication: oxyCODONE-acetaminophen (PERCOCET) 10-325 MG tablet   Has the patient contacted their pharmacy? Yes, pt says he still has medication left, but he just wants to make sure that his rx is filled on time.    Preferred Pharmacy (with phone number or street name): [id number] - [place], [place] - [address] AT [facility] OF [address]  [phone] (Phone) [phone] (Fax)    Agent: Please be advised that RX refills may take up to 3 business days. We ask that you follow-up with your pharmacy.

## 2017-11-21 ENCOUNTER — Telehealth: Payer: Self-pay

## 2017-11-21 ENCOUNTER — Institutional Professional Consult (permissible substitution): Payer: Medicare HMO | Admitting: Neurology

## 2017-11-21 MED ORDER — OXYCODONE-ACETAMINOPHEN 10-325 MG PO TABS: 1.0000 | ORAL_TABLET | ORAL | 0 refills | Status: DC | PRN

## 2017-11-21 NOTE — Telephone Encounter (Signed)
Pt did not show for their appt with Dr. Athar today.  

## 2017-11-21 NOTE — Telephone Encounter (Signed)
pmp reviewed. Refill date due 11/25/17

## 2017-11-26 ENCOUNTER — Encounter: Payer: Self-pay | Admitting: Family Medicine

## 2017-11-26 ENCOUNTER — Ambulatory Visit (INDEPENDENT_AMBULATORY_CARE_PROVIDER_SITE_OTHER): Payer: Medicare HMO | Admitting: Family Medicine

## 2017-11-26 ENCOUNTER — Other Ambulatory Visit: Payer: Self-pay

## 2017-11-26 VITALS — BP 118/82 | HR 90 | Temp 98.3°F | Resp 18 | Ht 68.0 in | Wt 212.8 lb

## 2017-11-26 DIAGNOSIS — G894 Chronic pain syndrome: Secondary | ICD-10-CM | POA: Diagnosis not present

## 2017-11-26 DIAGNOSIS — F17200 Nicotine dependence, unspecified, uncomplicated: Secondary | ICD-10-CM

## 2017-11-26 DIAGNOSIS — M5137 Other intervertebral disc degeneration, lumbosacral region: Secondary | ICD-10-CM

## 2017-11-26 DIAGNOSIS — M961 Postlaminectomy syndrome, not elsewhere classified: Secondary | ICD-10-CM

## 2017-11-26 DIAGNOSIS — M51379 Other intervertebral disc degeneration, lumbosacral region without mention of lumbar back pain or lower extremity pain: Secondary | ICD-10-CM

## 2017-11-26 DIAGNOSIS — F411 Generalized anxiety disorder: Secondary | ICD-10-CM

## 2017-11-26 MED ORDER — DULOXETINE HCL 60 MG PO CPEP: 60.0000 mg | ORAL_CAPSULE | Freq: Every day | ORAL | 3 refills | Status: DC

## 2017-11-26 MED ORDER — CYCLOBENZAPRINE HCL 10 MG PO TABS: 10.0000 mg | ORAL_TABLET | Freq: Three times a day (TID) | ORAL | 5 refills | Status: DC | PRN

## 2017-11-26 MED ORDER — DULOXETINE HCL 30 MG PO CPEP: 30.0000 mg | ORAL_CAPSULE | Freq: Every day | ORAL | 0 refills | Status: DC

## 2017-11-26 MED ORDER — DIAZEPAM 2 MG PO TABS: 2.0000 mg | ORAL_TABLET | Freq: Two times a day (BID) | ORAL | 0 refills | Status: DC

## 2017-11-26 NOTE — Patient Instructions (Signed)
° ° ° °  If you have lab work done today you will be contacted with your lab results within the next 2 weeks.  If you have not heard from us then please contact us. The fastest way to get your results is to register for My Chart. ° ° °IF you received an x-ray today, you will receive an invoice from [facility]. Please contact [facility] at [phone] with questions or concerns regarding your invoice.  ° °IF you received labwork today, you will receive an invoice from [facility]. Please contact [facility] at [phone] with questions or concerns regarding your invoice.  ° °Our billing staff will not be able to assist you with questions regarding bills from these companies. ° °You will be contacted with the lab results as soon as they are available. The fastest way to get your results is to activate your My Chart account. Instructions are located on the last page of this paperwork. If you have not heard from us regarding the results in 2 weeks, please contact this office. °  ° ° ° °

## 2017-11-26 NOTE — Progress Notes (Signed)
11/4/20198:16 AM  Scott [name] 06/21/1960, 57 y.o. male [id number]  Chief Complaint  Patient presents with  . Follow-up    feeling better from last visit. need refill on flexeril for back pain    HPI:   Patient is a 57 y.o. male with past medical history significant for DM2, HTN HLP, COPD and chronic painwho presents today forfollowup  Undergoing wean of diazepam currently completed 2 weeks of 2.5mg  BID - needs  Originally thought valium was given for muscle spasms but patient reports that today that he actually has anxiety He has never tried an SSRI or SNRI  Chronic pain stable. Doing well oc current regime. However he is requesting referral back to neurosurg, Dr Lovell Sheehan  He has had epidural injections in the past, they did not help Tried PT but unable to fully participate due to SOB/COPD  Was on wellbutrin for smoking Just received prescription for nicotine gum, has not started Smoking 3-4 cig a days  Has rescheduled sleep medicine visit  Fall Risk  11/26/2017 10/25/2017 09/25/2017 09/19/2017 08/28/2017  Falls in the past year? 0 No No No Yes  Number falls in past yr: - - - - 1  Injury with Fall? - - - - Yes  Comment - - - - KNEES     Depression screen [facility] 2/9 11/26/2017 10/25/2017 09/25/2017  Decreased Interest 0 0 0  Down, Depressed, Hopeless 0 0 0  PHQ - 2 Score 0 0 0  Altered sleeping - - -  Tired, decreased energy - - -  Change in appetite - - -  Feeling bad or failure about yourself  - - -  Trouble concentrating - - -  Moving slowly or fidgety/restless - - -  Suicidal thoughts - - -  PHQ-9 Score - - -    Allergies  Allergen Reactions  . Penicillins Anaphylaxis    Has patient had a PCN reaction causing immediate rash, facial/tongue/throat swelling, SOB or lightheadedness with hypotension: Yes Has patient had a PCN reaction causing severe rash involving mucus membranes or skin necrosis: Yes Has patient had a PCN reaction that required hospitalization:  Yes Has patient had a PCN reaction occurring within the last 10 years: No If all of the above answers are "NO", then may proceed with Cephalosporin use.     Prior to Admission medications   Medication Sig Start Date End Date Taking? Authorizing Provider  Acetaminophen-Aspirin Buffered (EXCEDRIN BACK & BODY PO) Take 1 tablet by mouth daily as needed.   Yes [provider]  buPROPion (WELLBUTRIN SR) 150 MG 12 hr tablet Take 1 tablet (150 mg total) by mouth 2 (two) times daily. 05/30/16  Yes Byrum, Les Pou, MD  COMBIVENT RESPIMAT 20-100 MCG/ACT AERS respimat INHALE 1 PUFF BY MOUTH INTO THE LUNGS FOUR TIMES A DAY 04/27/17  Yes Byrum, Les Pou, MD  cyclobenzaprine (FLEXERIL) 10 MG tablet Take 1 tablet (10 mg total) by mouth 3 (three) times daily as needed for muscle spasms. 10/25/17  Yes Myles Lipps, MD  Fluticasone-Salmeterol (ADVAIR) 250-50 MCG/DOSE AEPB Inhale 1 puff into the lungs 2 (two) times daily. 10/05/17  Yes Leslye Peer, MD  gabapentin (NEURONTIN) 300 MG capsule Take 1 capsule (300 mg total) by mouth 3 (three) times daily. 10/25/17  Yes Myles Lipps, MD  hydrochlorothiazide (HYDRODIURIL) 12.5 MG tablet Take 1 tablet (12.5 mg total) by mouth daily. 10/25/17  Yes Myles Lipps, MD  ipratropium-albuterol (DUONEB) 0.5-2.5 (3) MG/3ML SOLN INHALE 1 VIAL VIA  NEBULIZER EVERY 6 HOURS AS DIRECTED FOR SHORTNESS OF BREATH OR WHEEZING. 05/29/16  Yes Byrum, Les Pou, MD  ipratropium-albuterol (DUONEB) 0.5-2.5 (3) MG/3ML SOLN INHALE THE CONTENTS OF 1 VIAL VIA NEBULIZER EVERY 6 HOURS AS DIRECTED FOR SHORTNESS OF BREATH OR WHEEZING 10/24/17  Yes Byrum, Les Pou, MD  nicotine polacrilex (NICORETTE) 2 MG gum Take 1 each (2 mg total) by mouth as needed for smoking cessation. 10/25/17  Yes Myles Lipps, MD  omeprazole (PRILOSEC) 40 MG capsule TAKE 1 CAPSULE BY MOUTH EVERY DAY 10/25/17  Yes Myles Lipps, MD  oxyCODONE-acetaminophen (PERCOCET) 10-325 MG tablet Take 1 tablet by mouth every 4  (four) hours as needed for pain. 11/24/17  Yes Myles Lipps, MD  rosuvastatin (CRESTOR) 20 MG tablet Take 0.5 tablets (10 mg total) by mouth daily. 09/25/17  Yes Myles Lipps, MD  sitaGLIPtin-metformin (JANUMET) 50-500 MG per tablet Take 1 tablet by mouth daily.   Yes [provider]  VENTOLIN HFA 108 (90 Base) MCG/ACT inhaler INHALE 1 TO 2 PUFFS BY MOUTH EVERY 6 HOURS AS NEEDED FOR WHEEZING OR SHORTNESS OF BREATH 09/07/17  Yes Byrum, Les Pou, MD  Vitamin D, Ergocalciferol, (DRISDOL) 50000 units CAPS capsule Take 1 capsule (50,000 Units total) by mouth every 7 (seven) days. 10/27/17  Yes Myles Lipps, MD    Past Medical History:  Diagnosis Date  . 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 COPD (chronic obstructive pulmonary disease) (HCC)   . Diabetes mellitus without complication (HCC)   . GERD (gastroesophageal reflux disease)   . Headache(784.0)   . Pneumonia    hx    Past Surgical History:  Procedure Laterality Date  . ANTERIOR CERVICAL DECOMP/DISCECTOMY FUSION  12/22/2010   Procedure: ANTERIOR CERVICAL DECOMPRESSION/DISCECTOMY FUSION 3 LEVELS;  Surgeon: Cristi Loron;  Location: [facility];  Service: Neurosurgery;  Laterality: N/A;  Anterior cervical discectomy with fusion cervical three-four, four-five, and five sixCDF with Interbody Prosthesis, plating, and Bone Graft   . ANTERIOR CERVICAL DECOMP/DISCECTOMY FUSION N/A 10/16/2012   Procedure: CERVICAL SIX-SEVEN ANTERIOR CERVICAL DECOMPRESSION/DISCECTOMY FUSION WITH INTERBODY PROTHESIS PLATING BONEGRAFT WITH /POSSIBLE HARDWARE REMOVAL OLD PLATE;  Surgeon: Cristi Loron, MD;  Location: [facility];  Service: Neurosurgery;  Laterality: N/A;  . MULTIPLE TOOTH EXTRACTIONS    . OTHER SURGICAL HISTORY     surgery on cheekbone and head for fall 2000  . 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  SPINE SURGERY     2014 and 2016 - Dr Lovell Sheehan  . TONSILLECTOMY      Social History   Tobacco Use  . Smoking status: Former Smoker    Packs/day: 0.25    Years: 41.00    Pack years: 10.25    Types: Cigarettes    Start date: 03/17/2012    Last attempt to quit: 01/22/2016    Years since quitting: 1.8  . Smokeless tobacco: Never Used  Substance Use Topics  . Alcohol use: No    Alcohol/week: 0.0 standard drinks    Family History  Problem Relation Age of Onset  . Emphysema Mother   . COPD Mother   . Asthma Brother   . Diabetes Brother   . Hyperlipidemia Brother   . Hypertension Brother   . Asthma Sister   . Hyperlipidemia Sister   .  Hypertension Sister   . Mental illness Sister   . Heart disease Maternal Uncle   . Hypertension Brother     Review of Systems  Constitutional: Negative for chills and fever.  Respiratory: Negative for cough and shortness of breath.   Cardiovascular: Negative for chest pain, palpitations and leg swelling.  Gastrointestinal: Negative for abdominal pain, nausea and vomiting.     OBJECTIVE:  Blood pressure 118/82, pulse 90, temperature 98.3 F (36.8 C), temperature source Oral, resp. rate 18, height 5\' 8"  (1.727 m), weight 212 lb 12.8 oz (96.5 kg), SpO2 96 %. Body mass index is 32.36 kg/m.   Physical Exam  Constitutional: He is oriented to person, place, and time. He appears well-developed and well-nourished.  HENT:  Head: Normocephalic and atraumatic.  Mouth/Throat: Oropharynx is clear and moist.  Eyes: Pupils are equal, round, and reactive to light. Conjunctivae and EOM are normal.  Neck: Neck supple.  Pulmonary/Chest: Effort normal.  Neurological: He is alert and oriented to person, place, and time.  Skin: Skin is warm and dry.  Psychiatric: He has a normal mood and affect.  Nursing note and vitals reviewed.   ASSESSMENT and PLAN  1. Degeneration of lumbar or lumbosacral intervertebral disc 2. Cervical post-laminectomy  syndrome 3. Chronic pain syndrome Medication wise he is stable on current regime. He does want to see spine surg again as his low back is overall slowly getting worse.  - Ambulatory referral to Spine Surgery  4. Tobacco use disorder Smoking much less. Will do trial of gum. Consider restarting wellbutrin  5. Generalized anxiety disorder Not controlled. Tolerating wean of valium well. Staring duloxetine for anxiety, any benefit in pain would also be welcomed.   Other orders - DULoxetine (CYMBALTA) 30 MG capsule; Take 1 capsule (30 mg total) by mouth daily. - diazepam (VALIUM) 2 MG tablet; Take 1 tablet (2 mg total) by mouth 2 (two) times daily. - cyclobenzaprine (FLEXERIL) 10 MG tablet; Take 1 tablet (10 mg total) by mouth 3 (three) times daily as needed for muscle spasms. - DULoxetine (CYMBALTA) 60 MG capsule; Take 1 capsule (60 mg total) by mouth daily.  Return in about 4 weeks (around 12/24/2017) for wean.    Myles Lipps, MD Primary Care at [facility] [address] [place], [place] [address] Ph.  [phone] Fax [phone]

## 2017-11-28 ENCOUNTER — Telehealth: Payer: Self-pay | Admitting: Family Medicine

## 2017-11-28 DIAGNOSIS — M4807 Spinal stenosis, lumbosacral region: Secondary | ICD-10-CM

## 2017-11-28 NOTE — Telephone Encounter (Signed)
SPOKE TO BECKY IN DR JEFFREY JENKINS OFFICE AND BEFORE HE CAN BE SEEN HE HAS TO HAVE UP TO DATE IMAGING DONE , EITHER MRI OR CT FOR THEM TO DETERMINE IF SURGERY IS NEEDED 11/6

## 2017-11-29 NOTE — Telephone Encounter (Signed)
Mri lumbar spine ordered as it his low back pain that is becoming worse Last mri about 3 years ago DDD with mild spinal stenosis  H/o cervical spine surgery Has never had lumbar spine surgery

## 2017-11-29 NOTE — Addendum Note (Signed)
Addended by: Myles Lipps on: 11/29/2017 10:01 PM   Modules accepted: Orders

## 2017-12-13 ENCOUNTER — Telehealth: Payer: Self-pay | Admitting: Emergency Medicine

## 2017-12-13 ENCOUNTER — Ambulatory Visit
Admission: RE | Admit: 2017-12-13 | Discharge: 2017-12-13 | Disposition: A | Discharge status: Home / Self Care | Payer: Medicare HMO | Source: Ambulatory Visit | Attending: Family Medicine | Admitting: Family Medicine

## 2017-12-13 ENCOUNTER — Other Ambulatory Visit: Payer: Self-pay | Admitting: Family Medicine

## 2017-12-13 DIAGNOSIS — M4807 Spinal stenosis, lumbosacral region: Secondary | ICD-10-CM

## 2017-12-13 MED ORDER — IPRATROPIUM-ALBUTEROL 20-100 MCG/ACT IN AERS: INHALATION_SPRAY | RESPIRATORY_TRACT | 5 refills | Status: DC

## 2017-12-13 NOTE — Telephone Encounter (Signed)
Copied from CRM [id number]. Topic: Quick Communication - Rx Refill/Question >> Dec 13, 2017 11:11 AM Lynne Logan D wrote: Medication: oxyCODONE-acetaminophen (PERCOCET) 10-325 MG tablet / diazepam (VALIUM) 2 MG tablet  Has the patient contacted their pharmacy? No. (Agent: If no, request that the patient contact the pharmacy for the refill.) (Agent: If yes, when and what did the pharmacy advise?)  Preferred Pharmacy (with phone number or street name): [id number] - [place], [place] - [address] AT [facility] OF [address] [phone] (Phone) [phone] (Fax)    Agent: Please be advised that RX refills may take up to 3 business days. We ask that you follow-up with your pharmacy.

## 2017-12-13 NOTE — Telephone Encounter (Signed)
Spoke with patient verified the need and sent in prescription into the pharmacy nothing further needed at this time.

## 2017-12-14 NOTE — Telephone Encounter (Signed)
Requested medication (s) are due for refill today: janumet yes  Requested medication (s) are on the active medication list: janumet yes  Last refill: 11/14/17  Future visit scheduled:yes  Notes to clinic:  janumet per historical provider; montelukast and meloxicam not on medication list    Requested Prescriptions  Pending Prescriptions Disp Refills   montelukast (SINGULAIR) 10 MG tablet [Pharmacy Med Name: MONTELUKAST 10MG TABLET] 30 tablet     Sig: TAKE 1 TABLET BY MOUTH ONCE DAILY IN THE EVENING     Pulmonology:  Leukotriene Inhibitors Passed - 12/13/2017 10:57 AM      Passed - Valid encounter within last 12 months    Recent Outpatient Visits          2 weeks ago Degeneration of lumbar or lumbosacral intervertebral disc   [facility] at [facility], Lilia Argue, MD   1 month ago Diabetes mellitus without complication [facility])   [facility] Care at [facility], Lilia Argue, MD   2 months ago Diabetes mellitus without complication [facility])   Primary Care at [facility], Lilia Argue, MD   2 months ago Diabetes mellitus without complication [facility])   Primary Care at [facility], Lilia Argue, MD   3 months ago Diabetes mellitus without complication [facility])   Primary Care at [facility], Ines Bloomer, MD      Future Appointments            In 1 week Rutherford Guys, MD Primary Care at [place], [facility]          meloxicam ([place]) 15 MG tablet [Pharmacy Med Name: MELOXICAM 15 MG TAB] 10 tablet     Sig: TAKE 1 TABLET BY MOUTH EVERY DAY FOR 10 DAYS     Analgesics:  COX2 Inhibitors Failed - 12/13/2017 10:57 AM      Failed - HGB in normal range and within 360 days    Hemoglobin  Date Value Ref Range Status  09/19/2017 17.9 (H) 13.0 - 17.7 g/dL Final         Passed - Cr in normal range and within 360 days    Creatinine, Ser  Date Value Ref Range Status  10/25/2017 1.27 0.76 - 1.27 mg/dL Final         Passed - Patient is not pregnant      Passed - Valid encounter within last 12 months     Recent Outpatient Visits          2 weeks ago Degeneration of lumbar or lumbosacral intervertebral disc   [facility] at [facility], Lilia Argue, MD   1 month ago Diabetes mellitus without complication [facility])   [facility] at [facility], Lilia Argue, MD   2 months ago Diabetes mellitus without complication [facility])   [facility] Care at [facility], Lilia Argue, MD   2 months ago Diabetes mellitus without complication [facility])   Primary Care at [facility], Lilia Argue, MD   3 months ago Diabetes mellitus without complication [facility])   Primary Care at [facility], Ines Bloomer, MD      Future Appointments            In 1 week Rutherford Guys, MD Primary Care at [place], [place] 50-500 MG tablet [Pharmacy Med Name: JANUMET 50/500 MG TAB] 30 tablet     Sig: TAKE 1 TABLET BY MOUTH ONCE A DAY     Endocrinology:  Diabetes - Biguanide + DPP-4 Inhibitor Combos  Failed - 12/13/2017 10:57 AM      Failed - HBA1C is between 0 and 7.9 and within 180 days    Hgb A1c MFr Bld  Date Value Ref Range Status  06/01/2013 6.5 (H) <5.7 % Final    Comment:    (NOTE)                                                                       According to the ADA Clinical Practice Recommendations for 2011, when HbA1c is used as a screening test:  >=6.5%   Diagnostic of Diabetes Mellitus           (if abnormal result is confirmed) 5.7-6.4%   Increased risk of developing Diabetes Mellitus References:Diagnosis and Classification of Diabetes Mellitus,Diabetes OYDX,4128,78(MVEHM 1):S62-S69 and Standards of Medical Care in         Diabetes - 2011,Diabetes CNOB,0962,83 (Suppl 1):S11-S61.         Passed - Cr in normal range and within 360 days    Creatinine, Ser  Date Value Ref Range Status  10/25/2017 1.27 0.76 - 1.27 mg/dL Final         Passed - eGFR in normal range and within 360 days    GFR calc Af Amer  Date Value Ref Range Status  10/25/2017 73 >59 mL/min/1.73 Final   GFR calc non Af Amer   Date Value Ref Range Status  10/25/2017 63 >59 mL/min/1.73 Final         Passed - Valid encounter within last 6 months    Recent Outpatient Visits          2 weeks ago Degeneration of lumbar or lumbosacral intervertebral disc   [facility] at [facility], Lilia Argue, MD   1 month ago Diabetes mellitus without complication [facility])   [facility] Care at [facility], Lilia Argue, MD   2 months ago Diabetes mellitus without complication [facility])   [facility] Care at [facility], Lilia Argue, MD   2 months ago Diabetes mellitus without complication [facility])   Primary Care at [facility], Lilia Argue, MD   3 months ago Diabetes mellitus without complication [facility])   Primary Care at [facility], Ines Bloomer, MD      Future Appointments            In 1 week Rutherford Guys, MD Primary Care at [place], [place] 20-100 MCG/ACT AERS respimat [Pharmacy Med Name: COMBIVENT RESPIMAT 20-100 MCG/INH AER]      Sig: INHALE 1 PUFF BY MOUTH INTO THE [place]     Pulmonology:  Combination Products Passed - 12/13/2017 10:57 AM      Passed - Valid encounter within last 12 months    Recent Outpatient Visits          2 weeks ago Degeneration of lumbar or lumbosacral intervertebral disc   [facility] at [facility], Lilia Argue, MD   1 month ago Diabetes mellitus without complication [facility])   Primary Care at [facility], Lilia Argue, MD   2 months ago Diabetes mellitus without complication [facility])   Primary Care at [facility], Lilia Argue, MD  2 months ago Diabetes mellitus without complication [facility])   Primary Care at [facility], Lilia Argue, MD   3 months ago Diabetes mellitus without complication [facility])   Primary Care at [facility], Ines Bloomer, MD      Future Appointments            In 1 week Rutherford Guys, MD Primary Care at [place], [facility]

## 2017-12-18 MED ORDER — DIAZEPAM 2 MG PO TABS: 1.0000 mg | ORAL_TABLET | Freq: Two times a day (BID) | ORAL | 0 refills | Status: DC

## 2017-12-18 MED ORDER — OXYCODONE-ACETAMINOPHEN 10-325 MG PO TABS: 1.0000 | ORAL_TABLET | ORAL | 0 refills | Status: DC | PRN

## 2017-12-21 ENCOUNTER — Other Ambulatory Visit: Payer: Self-pay

## 2017-12-21 DIAGNOSIS — M5137 Other intervertebral disc degeneration, lumbosacral region: Secondary | ICD-10-CM

## 2017-12-21 DIAGNOSIS — M51379 Other intervertebral disc degeneration, lumbosacral region without mention of lumbar back pain or lower extremity pain: Secondary | ICD-10-CM

## 2017-12-21 DIAGNOSIS — M4807 Spinal stenosis, lumbosacral region: Secondary | ICD-10-CM

## 2017-12-26 ENCOUNTER — Encounter: Payer: Self-pay | Admitting: Family Medicine

## 2017-12-26 ENCOUNTER — Ambulatory Visit (INDEPENDENT_AMBULATORY_CARE_PROVIDER_SITE_OTHER): Payer: Medicare HMO | Admitting: Family Medicine

## 2017-12-26 ENCOUNTER — Other Ambulatory Visit: Payer: Self-pay

## 2017-12-26 VITALS — BP 154/93 | HR 121 | Temp 98.6°F | Resp 18 | Ht 68.0 in | Wt 204.4 lb

## 2017-12-26 DIAGNOSIS — E119 Type 2 diabetes mellitus without complications: Secondary | ICD-10-CM

## 2017-12-26 DIAGNOSIS — F411 Generalized anxiety disorder: Secondary | ICD-10-CM | POA: Diagnosis not present

## 2017-12-26 DIAGNOSIS — M4807 Spinal stenosis, lumbosacral region: Secondary | ICD-10-CM | POA: Diagnosis not present

## 2017-12-26 DIAGNOSIS — M51379 Other intervertebral disc degeneration, lumbosacral region without mention of lumbar back pain or lower extremity pain: Secondary | ICD-10-CM

## 2017-12-26 DIAGNOSIS — Z79899 Other long term (current) drug therapy: Secondary | ICD-10-CM

## 2017-12-26 DIAGNOSIS — M5137 Other intervertebral disc degeneration, lumbosacral region: Secondary | ICD-10-CM

## 2017-12-26 LAB — HEMOGLOBIN A1C
Est. average glucose Bld gHb Est-mCnc: 128 mg/dL
Hgb A1c MFr Bld: 6.1 % — ABNORMAL HIGH (ref 4.8–5.6)

## 2017-12-26 MED ORDER — SITAGLIPTIN PHOS-METFORMIN HCL 50-500 MG PO TABS: 1.0000 | ORAL_TABLET | Freq: Every day | ORAL | 1 refills | Status: DC

## 2017-12-26 MED ORDER — DIAZEPAM 2 MG PO TABS: 1.0000 mg | ORAL_TABLET | Freq: Two times a day (BID) | ORAL | 0 refills | Status: DC

## 2017-12-26 NOTE — Progress Notes (Signed)
12/4/201912:07 PM  Scott [name] 1960/02/10, 57 y.o. male [id number]  Chief Complaint  Patient presents with  . Follow-up    weaning from anxiety medication valium    HPI:   Patient is a 57 y.o. male with past medical history significant for DM2,HTNHLP, COPD and chronic painwho presents today forfollowup  Last valium decreased to 1mg  BID - however pharmacy states that prescription was not received Last valium was a day ago, has missed 3 doses Started duloxetine a week ago, currently at 30mg  Tolerating well He also did not get janumet, takes once a day, requesting refill Due for a1c today Denies any lows Also wanting to go MRI results for lumbar spine Needs referral to see Dr Lovell Sheehan, his surgeon Does not tolerate PT due to SOB Epidural injections never provided more than a week or 2 of relief  GAD 7 : Generalized Anxiety Score 12/26/2017  Nervous, Anxious, on Edge 3  Control/stop worrying 2  Worry too much - different things 3  Trouble relaxing 3  Restless 1  Easily annoyed or irritable 1  Afraid - awful might happen 0  Total GAD 7 Score 13  Anxiety Difficulty Somewhat difficult    Lab Results  Component Value Date   HGBA1C 6.5 (H) 06/01/2013   Lab Results  Component Value Date   LDLCALC 74 10/25/2017   CREATININE 1.27 10/25/2017    Fall Risk  12/26/2017 11/26/2017 10/25/2017 09/25/2017 09/19/2017  Falls in the past year? 0 0 No No No  Number falls in past yr: - - - - -  Injury with Fall? - - - - -  Comment - - - - -     Depression screen [facility] 2/9 12/26/2017 11/26/2017 10/25/2017  Decreased Interest 0 0 0  Down, Depressed, Hopeless 0 0 0  PHQ - 2 Score 0 0 0  Altered sleeping - - -  Tired, decreased energy - - -  Change in appetite - - -  Feeling bad or failure about yourself  - - -  Trouble concentrating - - -  Moving slowly or fidgety/restless - - -  Suicidal thoughts - - -  PHQ-9 Score - - -    Allergies  Allergen Reactions  . Penicillins  Anaphylaxis    Has patient had a PCN reaction causing immediate rash, facial/tongue/throat swelling, SOB or lightheadedness with hypotension: Yes Has patient had a PCN reaction causing severe rash involving mucus membranes or skin necrosis: Yes Has patient had a PCN reaction that required hospitalization: Yes Has patient had a PCN reaction occurring within the last 10 years: No If all of the above answers are "NO", then may proceed with Cephalosporin use.     Prior to Admission medications   Medication Sig Start Date End Date Taking? Authorizing Provider  Acetaminophen-Aspirin Buffered (EXCEDRIN BACK & BODY PO) Take 1 tablet by mouth daily as needed.   Yes [provider]  cyclobenzaprine (FLEXERIL) 10 MG tablet Take 1 tablet (10 mg total) by mouth 3 (three) times daily as needed for muscle spasms. 11/26/17  Yes Myles Lipps, MD  diazepam (VALIUM) 2 MG tablet Take 0.5 tablets (1 mg total) by mouth 2 (two) times daily. 12/18/17  Yes Myles Lipps, MD  DULoxetine (CYMBALTA) 30 MG capsule Take 1 capsule (30 mg total) by mouth daily. 11/26/17  Yes Myles Lipps, MD  DULoxetine (CYMBALTA) 60 MG capsule Take 1 capsule (60 mg total) by mouth daily. 11/26/17  Yes Myles Lipps, MD  Fluticasone-Salmeterol (ADVAIR) 250-50 MCG/DOSE AEPB Inhale 1 puff into the lungs 2 (two) times daily. 10/05/17  Yes Leslye Peer, MD  gabapentin (NEURONTIN) 300 MG capsule Take 1 capsule (300 mg total) by mouth 3 (three) times daily. 10/25/17  Yes Myles Lipps, MD  hydrochlorothiazide (HYDRODIURIL) 12.5 MG tablet Take 1 tablet (12.5 mg total) by mouth daily. 10/25/17  Yes Myles Lipps, MD  Ipratropium-Albuterol (COMBIVENT RESPIMAT) 20-100 MCG/ACT AERS respimat INHALE 1 PUFF BY MOUTH INTO THE LUNGS FOUR TIMES A DAY 12/13/17  Yes Byrum, Les Pou, MD  ipratropium-albuterol (DUONEB) 0.5-2.5 (3) MG/3ML SOLN INHALE 1 VIAL VIA NEBULIZER EVERY 6 HOURS AS DIRECTED FOR SHORTNESS OF BREATH OR WHEEZING. 05/29/16   Yes Byrum, Les Pou, MD  ipratropium-albuterol (DUONEB) 0.5-2.5 (3) MG/3ML SOLN INHALE THE CONTENTS OF 1 VIAL VIA NEBULIZER EVERY 6 HOURS AS DIRECTED FOR SHORTNESS OF BREATH OR WHEEZING 10/24/17  Yes Byrum, Les Pou, MD  nicotine polacrilex (NICORETTE) 2 MG gum Take 1 each (2 mg total) by mouth as needed for smoking cessation. 10/25/17  Yes Myles Lipps, MD  omeprazole (PRILOSEC) 40 MG capsule TAKE 1 CAPSULE BY MOUTH EVERY DAY 10/25/17  Yes Myles Lipps, MD  oxyCODONE-acetaminophen (PERCOCET) 10-325 MG tablet Take 1 tablet by mouth every 4 (four) hours as needed for pain. 12/18/17  Yes Myles Lipps, MD  rosuvastatin (CRESTOR) 20 MG tablet Take 0.5 tablets (10 mg total) by mouth daily. 09/25/17  Yes Myles Lipps, MD  sitaGLIPtin-metformin (JANUMET) 50-500 MG per tablet Take 1 tablet by mouth daily.   Yes [provider]  VENTOLIN HFA 108 (90 Base) MCG/ACT inhaler INHALE 1 TO 2 PUFFS BY MOUTH EVERY 6 HOURS AS NEEDED FOR WHEEZING OR SHORTNESS OF BREATH 09/07/17  Yes Byrum, Les Pou, MD  Vitamin D, Ergocalciferol, (DRISDOL) 50000 units CAPS capsule Take 1 capsule (50,000 Units total) by mouth every 7 (seven) days. 10/27/17  Yes Myles Lipps, MD    Past Medical History:  Diagnosis Date  . 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 COPD (chronic obstructive pulmonary disease) (HCC)   . Diabetes mellitus without complication (HCC)   . GERD (gastroesophageal reflux disease)   . Headache(784.0)   . Pneumonia    hx    Past Surgical History:  Procedure Laterality Date  . ANTERIOR CERVICAL DECOMP/DISCECTOMY FUSION  12/22/2010   Procedure: ANTERIOR CERVICAL DECOMPRESSION/DISCECTOMY FUSION 3 LEVELS;  Surgeon: Cristi Loron;  Location: [facility];  Service: Neurosurgery;  Laterality: N/A;  Anterior cervical discectomy with fusion cervical three-four, four-five, and five sixCDF with Interbody Prosthesis, plating, and Bone Graft   . ANTERIOR CERVICAL  DECOMP/DISCECTOMY FUSION N/A 10/16/2012   Procedure: CERVICAL SIX-SEVEN ANTERIOR CERVICAL DECOMPRESSION/DISCECTOMY FUSION WITH INTERBODY PROTHESIS PLATING BONEGRAFT WITH /POSSIBLE HARDWARE REMOVAL OLD PLATE;  Surgeon: Cristi Loron, MD;  Location: [facility];  Service: Neurosurgery;  Laterality: N/A;  . MULTIPLE TOOTH EXTRACTIONS    . OTHER SURGICAL HISTORY     surgery on cheekbone and head for fall 2000  . 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 SPINE SURGERY     2014 and 2016 - Dr Lovell Sheehan  . TONSILLECTOMY      Social History   Tobacco Use  . Smoking status: Current Some Day Smoker    Packs/day: 0.25  Years: 41.00    Pack years: 10.25    Types: Cigarettes    Start date: 03/17/2012    Last attempt to quit: 01/22/2016    Years since quitting: 1.9  . Smokeless tobacco: Never Used  Substance Use Topics  . Alcohol use: No    Alcohol/week: 0.0 standard drinks    Family History  Problem Relation Age of Onset  . Emphysema Mother   . COPD Mother   . Asthma Brother   . Diabetes Brother   . Hyperlipidemia Brother   . Hypertension Brother   . Asthma Sister   . Hyperlipidemia Sister   . Hypertension Sister   . Mental illness Sister   . Heart disease Maternal Uncle   . Hypertension Brother     Review of Systems  Constitutional: Negative for chills and fever.  Respiratory: Negative for cough and shortness of breath.   Cardiovascular: Negative for chest pain, palpitations and leg swelling.  Gastrointestinal: Negative for abdominal pain, nausea and vomiting.  Psychiatric/Behavioral: Negative for hallucinations. The patient is nervous/anxious.    OBJECTIVE:  Blood pressure (!) 154/93, pulse (!) 121, temperature 98.6 F (37 C), temperature source Oral, resp. rate 18, height 5\' 8"  (1.727 m), weight 204 lb 6.4 oz (92.7 kg), SpO2 96 %. Body mass index is 31.08 kg/m.   BP  Readings from Last 3 Encounters:  12/26/17 (!) 154/93  11/26/17 118/82  10/25/17 138/82    Physical Exam  Constitutional: He is oriented to person, place, and time. He appears well-developed and well-nourished.  HENT:  Head: Normocephalic and atraumatic.  Mouth/Throat: Oropharynx is clear and moist.  Eyes: Pupils are equal, round, and reactive to light. Conjunctivae and EOM are normal.  Neck: Neck supple.  Pulmonary/Chest: Effort normal.  Neurological: He is alert and oriented to person, place, and time.  Skin: Skin is warm and dry.  Psychiatric: He has a normal mood and affect.  Nursing note and vitals reviewed.    ASSESSMENT and PLAN  1. Diabetes mellitus without complication (HCC) Checking labs today, medications will be adjusted as needed.  - Hemoglobin A1c  2. Degeneration of lumbar or lumbosacral intervertebral disc 3. Spinal stenosis of lumbosacral region Reviewed mri with patient. Referring to spine, wondering if he would be a candidate for spinal cord stimulator  4. Generalized anxiety disorder Not well controlled, exacerbated by mild bzd withdrawal. Recent start of duloxetine. Discussed increasing to 60mg  in one week  5. Long term prescription benzodiazepine use Refilled today, have been waning slowly. new step down to 1mg  BID x 4 weeks. Ok to call for refills  Other orders - diazepam (VALIUM) 2 MG tablet; Take 0.5 tablets (1 mg total) by mouth 2 (two) times daily. - sitaGLIPtin-metformin (JANUMET) 50-500 MG tablet; Take 1 tablet by mouth daily.  Return in about 2 months (around 02/26/2018).    Myles Lipps, MD Primary Care at [facility] [address] [place], [place] [address] Ph.  [phone] Fax [phone]

## 2017-12-26 NOTE — Patient Instructions (Addendum)
  In another week, increase duloxetine to 60mg  daily    If you have lab work done today you will be contacted with your lab results within the next 2 weeks.  If you have not heard from [place] then please contact us. The fastest way to get your results is to register for My Chart.   IF you received an x-ray today, you will receive an invoice from [facility]. Please contact [facility] at [phone] with questions or concerns regarding your invoice.   IF you received labwork today, you will receive an invoice from [facility]. Please contact [facility] at [phone] with questions or concerns regarding your invoice.   Our billing staff will not be able to assist you with questions regarding bills from these companies.  You will be contacted with the lab results as soon as they are available. The fastest way to get your results is to activate your My Chart account. Instructions are located on the last page of this paperwork. If you have not heard from [place] regarding the results in 2 weeks, please contact this office.

## 2018-01-09 ENCOUNTER — Ambulatory Visit (INDEPENDENT_AMBULATORY_CARE_PROVIDER_SITE_OTHER): Payer: Medicare HMO | Admitting: Neurology

## 2018-01-09 ENCOUNTER — Encounter: Payer: Self-pay | Admitting: Neurology

## 2018-01-09 VITALS — BP 147/96 | HR 114 | Ht 68.0 in | Wt 209.0 lb

## 2018-01-09 DIAGNOSIS — J449 Chronic obstructive pulmonary disease, unspecified: Secondary | ICD-10-CM | POA: Diagnosis not present

## 2018-01-09 DIAGNOSIS — E669 Obesity, unspecified: Secondary | ICD-10-CM

## 2018-01-09 DIAGNOSIS — J9611 Chronic respiratory failure with hypoxia: Secondary | ICD-10-CM

## 2018-01-09 DIAGNOSIS — R0683 Snoring: Secondary | ICD-10-CM

## 2018-01-09 DIAGNOSIS — G479 Sleep disorder, unspecified: Secondary | ICD-10-CM

## 2018-01-09 DIAGNOSIS — R519 Headache, unspecified: Secondary | ICD-10-CM

## 2018-01-09 DIAGNOSIS — G4700 Insomnia, unspecified: Secondary | ICD-10-CM

## 2018-01-09 DIAGNOSIS — F17200 Nicotine dependence, unspecified, uncomplicated: Secondary | ICD-10-CM

## 2018-01-09 DIAGNOSIS — F1190 Opioid use, unspecified, uncomplicated: Secondary | ICD-10-CM

## 2018-01-09 DIAGNOSIS — R51 Headache: Secondary | ICD-10-CM

## 2018-01-09 DIAGNOSIS — R351 Nocturia: Secondary | ICD-10-CM

## 2018-01-09 NOTE — Patient Instructions (Signed)

## 2018-01-09 NOTE — Progress Notes (Signed)
Subjective:    Patient ID: Scott [name] is a 57 y.o. male.  HPI     Huston Foley, MD, PhD [facility] [address], [address] [address] [place], [place] [address]  Dear Dr. Leretha Pol,   I saw your patient, Scott [name], upon your kind request, in my sleep clinic today for initial consultation of his sleep disorder, in particular, concern for underlying obstructive sleep apnea. The patient is unaccompanied today. Of note, he missed an appointment on 11/21/2017. As you know, [name] is a 57 year old right-handed gentleman with an underlying medical history of COPD, hypoxemia with oxygen dependence at night, smoking, chronic pain on narcotic pain medication, s/p neck surgery under Dr. Lovell Sheehan, and obesity, who reports snoring and difficulty with sleep initiation and sleep maintenance. I reviewed your office note from 10/25/2017. He is in the process of weaning off of Valium and continues on Percocet. He will be seeing a spine specialist soon. He is supposed to be on Cymbalta, but tried it for 3 days, had SEs, including diarrhea, nausea and SI, so he stopped it after 3 days.  He smokes about 3-4 cigarettes per day, does not utilize alcohol, drinks no caffeine on a regular basis. He does not work. He has 4 children, lives with his wife of 35 years, she has 2 biological children, he had 4 from before. His Epworth sleepiness score is 2 out of 24 today, fatigue score is 33 out of 63. He does not use his O2 each night, maybe 3 nights out of the week, sees Dr. Delton Coombes.  He has nocturia about 2-3 times per night, has had occasional AM HAs. Has very little neck mobility, 2 neck surgeries, sleeps on his stomach.  He takes his wife's GDs to work or school and back, they are 52, 51 and 56 yo.   His BT is around 8 PM and rise time is 11 or 12 but sleep is disrupted and has to pick up the 18 GD at [place]. Has TV in BR all night. He does not sleep in the same room with his wife, in part d/t his  snoring and also his O2 concentrator is loud, when he uses it.   His Past Medical History Is Significant For: Past Medical History:  Diagnosis Date  . 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 COPD (chronic obstructive pulmonary disease) (HCC)   . Diabetes mellitus without complication (HCC)   . GERD (gastroesophageal reflux disease)   . Headache(784.0)   . Pneumonia    hx    His Past Surgical History Is Significant For: Past Surgical History:  Procedure Laterality Date  . ANTERIOR CERVICAL DECOMP/DISCECTOMY FUSION  12/22/2010   Procedure: ANTERIOR CERVICAL DECOMPRESSION/DISCECTOMY FUSION 3 LEVELS;  Surgeon: Cristi Loron;  Location: [facility];  Service: Neurosurgery;  Laterality: N/A;  Anterior cervical discectomy with fusion cervical three-four, four-five, and five sixCDF with Interbody Prosthesis, plating, and Bone Graft   . ANTERIOR CERVICAL DECOMP/DISCECTOMY FUSION N/A 10/16/2012   Procedure: CERVICAL SIX-SEVEN ANTERIOR CERVICAL DECOMPRESSION/DISCECTOMY FUSION WITH INTERBODY PROTHESIS PLATING BONEGRAFT WITH /POSSIBLE HARDWARE REMOVAL OLD PLATE;  Surgeon: Cristi Loron, MD;  Location: [facility];  Service: Neurosurgery;  Laterality: N/A;  . MULTIPLE TOOTH EXTRACTIONS    . OTHER SURGICAL HISTORY     surgery on cheekbone and head for fall 2000  . 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 SPINE SURGERY     2014 and 2016 - Dr Lovell Sheehan  . TONSILLECTOMY      His Family History Is Significant For: Family History  Problem Relation Age of Onset  . Emphysema Mother   . COPD Mother   . Asthma Brother   . Diabetes Brother   . Hyperlipidemia Brother   . Hypertension Brother   . Asthma Sister   . Hyperlipidemia Sister   . Hypertension Sister   . Mental illness Sister   . Heart disease Maternal Uncle   . Hypertension Brother     His Social History Is  Significant For: Social History   Socioeconomic History  . Marital status: Married    Spouse name: Not on file  . Number of children: Not on file  . Years of education: Not on file  . Highest education level: Not on file  Occupational History  . Occupation: DISABLED  Social Needs  . Financial resource strain: Not on file  . Food insecurity:    Worry: Not on file    Inability: Not on file  . Transportation needs:    Medical: Not on file    Non-medical: Not on file  Tobacco Use  . Smoking status: Current Some Day Smoker    Packs/day: 0.25    Years: 41.00    Pack years: 10.25    Types: Cigarettes    Start date: 03/17/2012    Last attempt to quit: 01/22/2016    Years since quitting: 1.9  . Smokeless tobacco: Never Used  Substance and Sexual Activity  . Alcohol use: No    Alcohol/week: 0.0 standard drinks  . Drug use: No  . Sexual activity: Not on file  Lifestyle  . Physical activity:    Days per week: Not on file    Minutes per session: Not on file  . Stress: Not on file  Relationships  . Social connections:    Talks on phone: Not on file    Gets together: Not on file    Attends religious service: Not on file    Active member of club or organization: Not on file    Attends meetings of clubs or organizations: Not on file    Relationship status: Not on file  Other Topics Concern  . Not on file  Social History Narrative  . Not on file    His Allergies Are:  Allergies  Allergen Reactions  . Penicillins Anaphylaxis    Has patient had a PCN reaction causing immediate rash, facial/tongue/throat swelling, SOB or lightheadedness with hypotension: Yes Has patient had a PCN reaction causing severe rash involving mucus membranes or skin necrosis: Yes Has patient had a PCN reaction that required hospitalization: Yes Has patient had a PCN reaction occurring within the last 10 years: No If all of the above answers are "NO", then may proceed with Cephalosporin use.   Jannette Spanner [Duloxetine Hcl]     "crazy thoughts"  :   His Current Medications Are:  Outpatient Encounter Medications as of 01/09/2018  Medication Sig  . Acetaminophen-Aspirin Buffered (EXCEDRIN BACK & BODY PO) Take 1 tablet by mouth daily as needed.  . cyclobenzaprine (FLEXERIL) 10 MG tablet Take 1 tablet (10 mg total) by mouth 3 (three) times daily as needed for muscle spasms.  . diazepam (VALIUM) 2 MG tablet Take 0.5 tablets (1 mg total) by mouth 2 (two) times daily.  Marland Kitchen  Fluticasone-Salmeterol (ADVAIR) 250-50 MCG/DOSE AEPB Inhale 1 puff into the lungs 2 (two) times daily.  Marland Kitchen gabapentin (NEURONTIN) 300 MG capsule Take 1 capsule (300 mg total) by mouth 3 (three) times daily.  . hydrochlorothiazide (HYDRODIURIL) 12.5 MG tablet Take 1 tablet (12.5 mg total) by mouth daily.  . Ipratropium-Albuterol (COMBIVENT RESPIMAT) 20-100 MCG/ACT AERS respimat INHALE 1 PUFF BY MOUTH INTO THE LUNGS FOUR TIMES A DAY  . ipratropium-albuterol (DUONEB) 0.5-2.5 (3) MG/3ML SOLN INHALE 1 VIAL VIA NEBULIZER EVERY 6 HOURS AS DIRECTED FOR SHORTNESS OF BREATH OR WHEEZING.  Marland Kitchen ipratropium-albuterol (DUONEB) 0.5-2.5 (3) MG/3ML SOLN INHALE THE CONTENTS OF 1 VIAL VIA NEBULIZER EVERY 6 HOURS AS DIRECTED FOR SHORTNESS OF BREATH OR WHEEZING  . nicotine polacrilex (NICORETTE) 2 MG gum Take 1 each (2 mg total) by mouth as needed for smoking cessation.  Marland Kitchen omeprazole (PRILOSEC) 40 MG capsule TAKE 1 CAPSULE BY MOUTH EVERY DAY  . oxyCODONE-acetaminophen (PERCOCET) 10-325 MG tablet Take 1 tablet by mouth every 4 (four) hours as needed for pain.  . rosuvastatin (CRESTOR) 20 MG tablet Take 0.5 tablets (10 mg total) by mouth daily.  . sitaGLIPtin-metformin (JANUMET) 50-500 MG tablet Take 1 tablet by mouth daily.  . VENTOLIN HFA 108 (90 Base) MCG/ACT inhaler INHALE 1 TO 2 PUFFS BY MOUTH EVERY 6 HOURS AS NEEDED FOR WHEEZING OR SHORTNESS OF BREATH  . Vitamin D, Ergocalciferol, (DRISDOL) 50000 units CAPS capsule Take 1 capsule (50,000 Units  total) by mouth every 7 (seven) days.  . [DISCONTINUED] DULoxetine (CYMBALTA) 30 MG capsule Take 1 capsule (30 mg total) by mouth daily.  . [DISCONTINUED] DULoxetine (CYMBALTA) 60 MG capsule Take 1 capsule (60 mg total) by mouth daily.   No facility-administered encounter medications on file as of 01/09/2018.   :  Review of Systems:  Out of a complete 14 point review of systems, all are reviewed and negative with the exception of these symptoms as listed below: Review of Systems  Neurological:       Pt presents today to discuss his sleep. Pt has never had a sleep stud but does endorse snoring. Pt reports that he has trouble sleeping.  Epworth Sleepiness Scale 0= would never doze 1= slight chance of dozing 2= moderate chance of dozing 3= high chance of dozing  Sitting and reading: 0 Watching TV: 0 Sitting inactive in a public place (ex. Theater or meeting): 0 As a passenger in a car for an hour without a break: 2 Lying down to rest in the afternoon: 0 Sitting and talking to someone: 0 Sitting quietly after lunch (no alcohol): 0 In a car, while stopped in traffic: 0 Total: 2     Objective:  Neurological Exam  Physical Exam Physical Examination:   Vitals:   01/09/18 0855  BP: (!) 147/96  Pulse: (!) 114    General Examination: The patient is a very pleasant 57 y.o. male in no acute distress. He appears well-developed and well-nourished and well groomed.   HEENT: Normocephalic, atraumatic, pupils are equal, round and reactive to light and accommodation. Extraocular tracking is good without limitation to gaze excursion or nystagmus noted. Normal smooth pursuit is noted. Hearing is grossly intact. Face is symmetric with normal facial animation and normal facial sensation. Speech is clear with no dysarthria noted. There is no hypophonia. There is no lip, neck/head, jaw or voice tremor. Neck with very limited mobility. Scar L anterior neck, well healed. There are no carotid bruits  on auscultation. Oropharynx exam reveals: moderate mouth dryness,  near-edentulous state with 5 teeth left on the bottom. Moderate airway crowding, due to longer uvula, large tongue and redundant soft palate. Mallampati is class I. Tongue protrudes centrally and palate elevates symmetrically. Tonsils are absent. Neck size is 18.25 inches.    Chest: Clear to auscultation without wheezing, rhonchi or crackles noted.  Heart: S1+S2+0, regular and normal without murmurs, rubs or gallops noted.   Abdomen: Soft, non-tender and non-distended with normal bowel sounds appreciated on auscultation.  Extremities: There is no pitting edema in the distal lower extremities bilaterally.   Skin: Warm and dry without trophic changes noted.  Musculoskeletal: exam reveals no obvious joint deformities, tenderness or joint swelling or erythema.   Neurologically:  Mental status: The patient is awake, alert and oriented in all 4 spheres. His immediate and remote memory, attention, language skills and fund of knowledge are appropriate. There is no evidence of aphasia, agnosia, apraxia or anomia. Speech is clear with normal prosody and enunciation. Thought process is linear. Mood is normal and affect is blunted.  Cranial nerves II - XII are as described above under HEENT exam. In addition: shoulder shrug is normal with equal shoulder height noted. Motor exam: Normal bulk, strength and tone is noted. There is no drift, tremor or rebound. Fine motor skills and coordination: Mildly impaired in the upper extremities. Cerebellar testing: No dysmetria or intention tremor.  Sensory exam: intact to light touch but reports tingling in both hands.  Gait, station and balance: He stands slowly and with mild difficulty. He has increase in lumbar kyphosis. He walks slightly wide-based and slowly and cautiously.  Assessment and plan:  In summary, Jonah Gingras is a very pleasant 57 y.o.-year old male with an underlying medical history  of COPD, hypoxemia with oxygen dependence at night, smoking, chronic pain on narcotic pain medication, s/p neck surgery under Dr. Lovell Sheehan, and obesity, whose history and physical exam are concerning for obstructive sleep apnea (OSA). I had a long chat with the patient about my findings and the diagnosis of OSA, its prognosis and treatment options. We talked about medical treatments, surgical interventions and non-pharmacological approaches. I explained in particular the risks and ramifications of untreated moderate to severe OSA, especially with respect to developing cardiovascular disease down the Road, including congestive heart failure, difficult to treat hypertension, cardiac arrhythmias, or stroke. Even type 2 diabetes has, in part, been linked to untreated OSA. Symptoms of untreated OSA include daytime sleepiness, memory problems, mood irritability and mood disorder such as depression and anxiety, lack of energy, as well as recurrent headaches, especially morning headaches. We talked about smoking cessation and trying to maintain a healthy lifestyle in general, as well as the importance of weight control. I encouraged the patient to eat healthy, exercise daily and keep well hydrated, to keep a scheduled bedtime and wake time routine, to not skip any meals and eat healthy snacks in between meals. I advised the patient not to drive when feeling sleepy.  I recommended the following at this time: sleep study with potential positive airway pressure titration. (We will score hypopneas at 4%).   I explained the sleep test procedure to the patient and also outlined possible surgical and non-surgical treatment options of OSA, including the use of a custom-made dental device (which would require a referral to a specialist dentist or oral surgeon), upper airway surgical options, such as pillar implants, radiofrequency surgery, tongue base surgery, and UPPP (which would involve a referral to an ENT surgeon). Rarely,  jaw surgery such  as mandibular advancement may be considered.  I also explained the CPAP treatment option to the patient, who indicated that he would be willing to try CPAP if the need arises. I explained the importance of being compliant with PAP treatment, not only for insurance purposes but primarily to improve His symptoms, and for the patient's long term health benefit, including to reduce His cardiovascular risks. I answered all his questions today and the patient was in agreement. I plan to see him back after the sleep study is completed and encouraged him to call with any interim questions, concerns, problems or updates.   Thank you very much for allowing me to participate in the care of this nice patient. If I can be of any further assistance to you please do not hesitate to call me at [phone].  Sincerely,   Huston Foley, MD, PhD

## 2018-01-10 ENCOUNTER — Encounter: Payer: Self-pay | Admitting: *Deleted

## 2018-01-11 ENCOUNTER — Other Ambulatory Visit: Payer: Self-pay | Admitting: Family Medicine

## 2018-01-11 MED ORDER — DIAZEPAM 1 MG/ML PO SOLN: ORAL | 0 refills | Status: DC

## 2018-01-11 MED ORDER — OXYCODONE-ACETAMINOPHEN 10-325 MG PO TABS: 1.0000 | ORAL_TABLET | ORAL | 0 refills | Status: DC | PRN

## 2018-01-11 NOTE — Telephone Encounter (Signed)
Requested medication (s) are due for refill today: diazepam no  Requested medication (s) are on the active medication list: yes  Last refill:  12/26/17  Future visit scheduled: yes   Notes to clinic: not delegated   Requested medication (s) are due for refill today:Oxycodone-acetaminophen 10-325 yes    Requested medication (s) are on the active medication list: yes  Last refill:  12/18/17  Future visit scheduled: yes  Notes to clinic:  Not delegated    Requested Prescriptions  Pending Prescriptions Disp Refills   diazepam (VALIUM) 2 MG tablet 30 tablet 0    Sig: Take 0.5 tablets (1 mg total) by mouth 2 (two) times daily.     Not Delegated - Psychiatry:  Anxiolytics/Hypnotics Failed - 01/11/2018  7:50 AM      Failed - This refill cannot be delegated      Failed - Urine Drug Screen completed in last 360 days.      Passed - Valid encounter within last 6 months    Recent Outpatient Visits          2 weeks ago Diabetes mellitus without complication [facility])   [facility], Meda Coffee, MD   1 month ago Degeneration of lumbar or lumbosacral intervertebral disc   [facility], Meda Coffee, MD   2 months ago Diabetes mellitus without complication [facility])   [facility], Meda Coffee, MD   3 months ago Diabetes mellitus without complication [facility])   [facility], Meda Coffee, MD   3 months ago Diabetes mellitus without complication [facility])   [facility], Meda Coffee, MD      Future Appointments            In 1 month Myles Lipps, MD Primary Care at [place], [facility]          oxyCODONE-acetaminophen (PERCOCET) 10-325 MG tablet 150 tablet 0    Sig: Take 1 tablet by mouth every 4 (four) hours as needed for pain.     Not Delegated - Analgesics:  Opioid Agonist Combinations Failed - 01/11/2018  7:50 AM      Failed - This refill cannot be delegated      Failed - Urine Drug Screen completed in last 360 days.       Passed - Valid encounter within last 6 months    Recent Outpatient Visits          2 weeks ago Diabetes mellitus without complication [facility]   [facility], Meda Coffee, MD   1 month ago Degeneration of lumbar or lumbosacral intervertebral disc   [facility], Meda Coffee, MD   2 months ago Diabetes mellitus without complication [facility])   [facility], Meda Coffee, MD   3 months ago Diabetes mellitus without complication [facility])   [facility], Meda Coffee, MD   3 months ago Diabetes mellitus without complication [facility])   [facility], Meda Coffee, MD      Future Appointments            In 1 month Myles Lipps, MD Primary Care at [place], [facility]

## 2018-01-11 NOTE — Telephone Encounter (Signed)
Copied from CRM [id number]. Topic: Quick Communication - Rx Refill/Question >> Jan 11, 2018  7:40 AM Fanny Bien wrote: Medication: oxyCODONE-acetaminophen (PERCOCET) 10-325 MG tablet [[id number]] diazepam (VALIUM) 2 MG tablet [[id number]]   Has the patient contacted their pharmacy?yes Preferred Pharmacy (with phone number or street name):[id number] - [place], [place] - [address] AT [facility] OF [address] & [address] [phone] (Phone) [phone] (Fax)   Agent: Please be advised that RX refills may take up to 3 business days. We ask that you follow-up with your pharmacy.

## 2018-01-11 NOTE — Telephone Encounter (Signed)
Patient is requesting a refill of the following medications: Requested Prescriptions   Pending Prescriptions Disp Refills  . diazepam (VALIUM) 2 MG tablet 30 tablet 0    Sig: Take 0.5 tablets (1 mg total) by mouth 2 (two) times daily.  Marland Kitchen oxyCODONE-acetaminophen (PERCOCET) 10-325 MG tablet 150 tablet 0    Sig: Take 1 tablet by mouth every 4 (four) hours as needed for pain.    Date of patient request:01/11/2018 Last office visit: 12/26/2017 Date of last refill:12/26/2017 Last refill amount: 30 Follow up time period per chart:

## 2018-02-06 ENCOUNTER — Telehealth: Payer: Self-pay | Admitting: Family Medicine

## 2018-02-06 NOTE — Telephone Encounter (Signed)
Copied from CRM [id number]. Topic: Quick Communication - Rx Refill/Question >> Feb 06, 2018  9:48 AM Arlyss Gandy, NT wrote: Medication: oxyCODONE-acetaminophen (PERCOCET) 10-325 MG tablet and diazepam (VALIUM) 1 MG/ML solution   Has the patient contacted their pharmacy? Yes.   (Agent: If no, request that the patient contact the pharmacy for the refill.) (Agent: If yes, when and what did the pharmacy advise?)  Preferred Pharmacy (with phone number or street name): [id number] - [place], [place] - [address] AT [facility] OF [address] & [address] [phone] (Phone) [phone] (Fax)   Pt also wants to let the dr know he has his sleep study tomorrow night.    Agent: Please be advised that RX refills may take up to 3 business days. We ask that you follow-up with your pharmacy.

## 2018-02-07 ENCOUNTER — Ambulatory Visit (INDEPENDENT_AMBULATORY_CARE_PROVIDER_SITE_OTHER): Payer: Medicare HMO | Admitting: Neurology

## 2018-02-07 DIAGNOSIS — R0683 Snoring: Secondary | ICD-10-CM

## 2018-02-07 DIAGNOSIS — G4700 Insomnia, unspecified: Secondary | ICD-10-CM

## 2018-02-07 DIAGNOSIS — G4720 Circadian rhythm sleep disorder, unspecified type: Secondary | ICD-10-CM

## 2018-02-07 DIAGNOSIS — G4734 Idiopathic sleep related nonobstructive alveolar hypoventilation: Secondary | ICD-10-CM | POA: Diagnosis not present

## 2018-02-07 DIAGNOSIS — J449 Chronic obstructive pulmonary disease, unspecified: Secondary | ICD-10-CM

## 2018-02-07 DIAGNOSIS — E669 Obesity, unspecified: Secondary | ICD-10-CM

## 2018-02-07 DIAGNOSIS — G4733 Obstructive sleep apnea (adult) (pediatric): Secondary | ICD-10-CM

## 2018-02-07 DIAGNOSIS — G479 Sleep disorder, unspecified: Secondary | ICD-10-CM

## 2018-02-07 DIAGNOSIS — F1190 Opioid use, unspecified, uncomplicated: Secondary | ICD-10-CM

## 2018-02-07 DIAGNOSIS — R351 Nocturia: Secondary | ICD-10-CM

## 2018-02-07 DIAGNOSIS — R519 Headache, unspecified: Secondary | ICD-10-CM

## 2018-02-07 DIAGNOSIS — R51 Headache: Secondary | ICD-10-CM

## 2018-02-07 DIAGNOSIS — F17200 Nicotine dependence, unspecified, uncomplicated: Secondary | ICD-10-CM

## 2018-02-07 DIAGNOSIS — J9611 Chronic respiratory failure with hypoxia: Secondary | ICD-10-CM

## 2018-02-07 MED ORDER — DIAZEPAM 1 MG/ML PO SOLN: ORAL | 0 refills | Status: DC

## 2018-02-07 MED ORDER — OXYCODONE-ACETAMINOPHEN 10-325 MG PO TABS: 1.0000 | ORAL_TABLET | ORAL | 0 refills | Status: DC | PRN

## 2018-02-07 NOTE — Telephone Encounter (Signed)
Please advise 

## 2018-02-07 NOTE — Telephone Encounter (Signed)
UDS done: oct 2019 [place] CSR reviewed: today CSA signed: yes  Meds refilled Cont with bzd wean

## 2018-02-12 ENCOUNTER — Other Ambulatory Visit: Payer: Self-pay | Admitting: Family Medicine

## 2018-02-13 ENCOUNTER — Telehealth: Payer: Self-pay

## 2018-02-13 NOTE — Progress Notes (Signed)
Patient referred by Dr. Leretha Pol, seen by me on 01/09/18, diagnostic PSG on 02/07/18.   Please call and notify the patient that the recent sleep study did not show any significant obstructive sleep apnea with the exception of snoring. For this, CPAP therapy is not indicated. Avoidance of the supine sleep position and weight loss may help alleviate the snoring. The patient was noted to have lower O2 saturations during wake and more so during sleep; supplemental O2 was applied. The patient is reminded to be compliant with his home oxygen regimen. Please remind him to quit smoking. Weight loss and optimization of his COPD treatment should be pursued. In addition, avoidance of sedating medications, such as benzodiazepines and narcotic pain medication should be sought, as much as possible and feasible.  He did not sleep very well. But, he was noted to have multiple breaks for snacks and water, drank several glasses of water. I would like to discourage him to eat and drink multiple times during the night. He can FU with PCP.   Thanks,  Huston Foley, MD, PhD [facility])

## 2018-02-13 NOTE — Procedures (Signed)
PATIENT'S NAME:  Scott [name], Scott [name] DOB:      03-24-1960      MR#:    [id number]     DATE OF RECORDING: 02/07/2018 REFERRING M.D.:  Koren Shiver, MD Study Performed:   Baseline Polysomnogram HISTORY: 58 year old man with a history of COPD, hypoxemia with oxygen dependence at night, smoking, chronic pain, on narcotic pain medication, s/p neck surgery, and obesity, who reports snoring and difficulty with sleep initiation and sleep maintenance. He is in the process of weaning off of Valium. He does not use his O2 each night, maybe 3 nights out of the week. The patient endorsed the Epworth Sleepiness Scale at 2 points.   The patient's weight 209 pounds with a height of 68 (inches), resulting in a BMI of 31.7 kg/m2. The patient's neck circumference measured 18.3 inches.   CURRENT MEDICATIONS: Acetaminophen-Aspirin, Flexeril, Valium, Advair, Neurontin, Hydrodiuril, Combivent, Duoneb, Nicorette, Prilosec, Percocet, Crestor, Janumet, Ventolin, Vitamin D.   PROCEDURE:  This is a multichannel digital polysomnogram utilizing the Somnostar 11.2 system.  Electrodes and sensors were applied and monitored per AASM Specifications.   EEG, EOG, Chin and Limb EMG, were sampled at 200 Hz.  ECG, Snore and Nasal Pressure, Thermal Airflow, Respiratory Effort, CPAP Flow and Pressure, Oximetry was sampled at 50 Hz. Digital video and audio were recorded.      BASELINE STUDY  Lights Out was at 21:05 and Lights On at 05:00.  Total recording time (TRT) was 475.5 minutes, with a total sleep time (TST) of 334 minutes.   The patient's sleep latency was 28.5 minutes. REM latency was 178 minutes, which is delayed. The sleep efficiency was 70.2%.     SLEEP ARCHITECTURE: WASO (Wake after sleep onset) was 112.5 minutes with moderate sleep fragmentation noted and multiple breaks to eat and drink. There were 64 minutes in Stage N1, 232.5 minutes Stage N2, 11 minutes Stage N3 and 26.5 minutes in Stage REM.  The percentage of Stage N1 was 19.2%,  which is increased, Stage N2 was 69.6%, which is increased, Stage N3 was 3.3% and Stage R (REM sleep) was 7.9%, which is reduced. The arousals were noted as: 15 were spontaneous, 0 were associated with PLMs, 0 were associated with respiratory events.   RESPIRATORY ANALYSIS: There were a total of 3 respiratory events:  0 obstructive apneas, 0 central apneas and 0 mixed apneas with a total of 0 apneas and an apnea index (AI) of 0 /hour. There were 3 hypopneas with a hypopnea index of .5 /hour. The patient also had 0 respiratory event related arousals (RERAs).      The total APNEA/HYPOPNEA INDEX (AHI) was .5 /hour and the total RESPIRATORY DISTURBANCE INDEX was 0. .5 /hour.  0 events occurred in REM sleep and 6 events in NREM. The REM AHI was 0. 0 /hour, versus a non-REM AHI of .6. The patient spent 187 minutes of total sleep time in the supine position and 147 minutes in non-supine.. The supine AHI was 1.0 versus a non-supine AHI of 0.0.  OXYGEN SATURATION & C02: The Wake baseline 02 saturation was 91%, with the lowest being 84%. Time spent below 89% saturation equaled 143 minutes. The study was initiated without supplemental oxygen. The patient does not use home oxygen each night. He was noted to have an average awake supine O2 saturation of 89% and 87% during supine sleep. Supplemental O2 was applied at epoch 378, 00:05 AM at 1 lpm, then increased to 2 lpm, after which his asleep average O2 saturation  was 90%.    PERIODIC LIMB MOVEMENTS: The patient had a total of 78 Periodic Limb Movements.  The Periodic Limb Movement (PLM) index was 14/hour, and the PLM Arousal index was 0/hour.  Audio and video analysis did not show any abnormal or unusual movements, behaviors, phonations or vocalizations. The patient brought his dinner which he ate prior to study start. He drank multiple glasses of water, had one bathroom breaks, but several breaks to eat snacks throughout the night. Snoring was noted, ranging from  mild to moderate. The EKG was in keeping with normal sinus rhythm (NSR). Post-study, the patient indicated that sleep was the same as usual.   IMPRESSION: 1. Oxygen desaturations during sleep 2. Primary Snoring 3. Dysfunctions associated with sleep stages or arousal from sleep  RECOMMENDATIONS: 1. This study does not demonstrate any significant obstructive or central sleep disordered breathing with the exception of snoring. For this, CPAP therapy is not indicated. Avoidance of the supine sleep position and weight loss may help alleviate the snoring.  2. The patient was noted to have lower O2 saturations during supine wakefulness and more so during sleep; supplemental O2 was applied. The patient should be reminded to be compliant with his home oxygen regimen. He should be strongly encouraged to quit smoking. Weight loss and optimization of his COPD treatment should be pursued. In addition, avoidance of sedating medications, such as benzodiazepines and narcotic pain medication should be sought, as much as possible and feasible.  3. This study shows sleep fragmentation and abnormal sleep stage percentages; these are nonspecific findings and per se do not signify an intrinsic sleep disorder or a cause for the patient's sleep-related symptoms. Causes include (but are not limited to) the first night effect of the sleep study, circadian rhythm disturbances, medication effect or an underlying mood disorder or medical problem. The patient should be discouraged to eat and drink multiple times during the night.  4. The patient should be cautioned not to drive, work at heights, or operate dangerous or heavy equipment when tired or sleepy. Review and reiteration of good sleep hygiene measures should be pursued with any patient. 5. The patient will be advised to follow up with the referring provider, who will be notified of the test results.  I certify that I have reviewed the entire raw data recording prior to the  issuance of this report in accordance with the Standards of Accreditation of the American Academy of Sleep Medicine (AASM)  Debera Sterba,MD,PhD Diplomat, American Board of Neurology and Sleep Medicine (Neurology and Sleep Medicine)

## 2018-02-13 NOTE — Telephone Encounter (Signed)
Requested Prescriptions  Pending Prescriptions Disp Refills  . gabapentin (NEURONTIN) 300 MG capsule [Pharmacy Med Name: GABAPENTIN 300MG  CAPSULE] 90 capsule 2    Sig: TAKE 1 CAPSULE BY MOUTH 3 TIMES DAILY     Neurology: Anticonvulsants - gabapentin Passed - 02/12/2018 10:17 PM      Passed - Valid encounter within last 12 months    Recent Outpatient Visits          1 month ago Diabetes mellitus without complication [facility])   [facility], Meda Coffee, MD   2 months ago Degeneration of lumbar or lumbosacral intervertebral disc   [facility], Meda Coffee, MD   3 months ago Diabetes mellitus without complication [facility])   [facility], Meda Coffee, MD   4 months ago Diabetes mellitus without complication [facility])   [facility], Meda Coffee, MD   4 months ago Diabetes mellitus without complication [facility])   [facility], Meda Coffee, MD      Future Appointments            In 2 weeks Myles Lipps, MD Primary Care at [place], [facility]

## 2018-02-13 NOTE — Telephone Encounter (Signed)
-----   Message from Huston Foley, MD sent at 02/13/2018  8:22 AM EST ----- Patient referred by Dr. Leretha Pol, seen by me on 01/09/18, diagnostic PSG on 02/07/18.   Please call and notify the patient that the recent sleep study did not show any significant obstructive sleep apnea with the exception of snoring. For this, CPAP therapy is not indicated. Avoidance of the supine sleep position and weight loss may help alleviate the snoring. The patient was noted to have lower O2 saturations during wake and more so during sleep; supplemental O2 was applied. The patient is reminded to be compliant with his home oxygen regimen. Please remind him to quit smoking. Weight loss and optimization of his COPD treatment should be pursued. In addition, avoidance of sedating medications, such as benzodiazepines and narcotic pain medication should be sought, as much as possible and feasible.  He did not sleep very well. But, he was noted to have multiple breaks for snacks and water, drank several glasses of water. I would like to discourage him to eat and drink multiple times during the night. He can FU with PCP.   Thanks,  Huston Foley, MD, PhD [facility])

## 2018-02-13 NOTE — Telephone Encounter (Signed)
I called pt and discussed his sleep study results with him. Pt reports that since his pain medications were reduced he is having trouble sleeping. I discussed with him the importance of smoking cessation, weight loss, and COPD management as well as avoidance of supine sleep. Pt will discuss his pain further with Dr. Leretha Pol. Pt will also try to avoid eating and drinking during the night. Pt verbalized understanding of results. Pt had no questions at this time but was encouraged to call back if questions arise.

## 2018-02-16 DIAGNOSIS — J449 Chronic obstructive pulmonary disease, unspecified: Secondary | ICD-10-CM | POA: Diagnosis not present

## 2018-03-01 ENCOUNTER — Telehealth: Payer: Self-pay | Admitting: Family Medicine

## 2018-03-01 ENCOUNTER — Encounter: Payer: Self-pay | Admitting: Family Medicine

## 2018-03-01 ENCOUNTER — Ambulatory Visit (INDEPENDENT_AMBULATORY_CARE_PROVIDER_SITE_OTHER): Payer: Medicare HMO | Admitting: Family Medicine

## 2018-03-01 ENCOUNTER — Other Ambulatory Visit: Payer: Self-pay

## 2018-03-01 VITALS — BP 131/86 | HR 124 | Temp 99.1°F | Resp 20 | Ht 68.31 in | Wt 206.8 lb

## 2018-03-01 DIAGNOSIS — E559 Vitamin D deficiency, unspecified: Secondary | ICD-10-CM | POA: Diagnosis not present

## 2018-03-01 DIAGNOSIS — R000 Tachycardia, unspecified: Secondary | ICD-10-CM

## 2018-03-01 DIAGNOSIS — E119 Type 2 diabetes mellitus without complications: Secondary | ICD-10-CM

## 2018-03-01 DIAGNOSIS — Z79899 Other long term (current) drug therapy: Secondary | ICD-10-CM

## 2018-03-01 DIAGNOSIS — M5137 Other intervertebral disc degeneration, lumbosacral region: Secondary | ICD-10-CM | POA: Diagnosis not present

## 2018-03-01 DIAGNOSIS — M51379 Other intervertebral disc degeneration, lumbosacral region without mention of lumbar back pain or lower extremity pain: Secondary | ICD-10-CM

## 2018-03-01 DIAGNOSIS — E785 Hyperlipidemia, unspecified: Secondary | ICD-10-CM

## 2018-03-01 MED ORDER — METOPROLOL SUCCINATE ER 25 MG PO TB24: 25.0000 mg | ORAL_TABLET | Freq: Every day | ORAL | 1 refills | Status: DC

## 2018-03-01 MED ORDER — OXYCODONE-ACETAMINOPHEN 10-325 MG PO TABS: 1.0000 | ORAL_TABLET | ORAL | 0 refills | Status: DC | PRN

## 2018-03-01 MED ORDER — DIAZEPAM 1 MG/ML PO SOLN: 0.6000 mg | Freq: Two times a day (BID) | ORAL | 0 refills | Status: DC

## 2018-03-01 NOTE — Telephone Encounter (Signed)
Copied from CRM [id number]. Topic: Quick Communication - See Telephone Encounter >> Mar 01, 2018  1:02 PM Windy Kalata, NT wrote: CRM for notification. See Telephone encounter for: 03/01/18.  Patient is calling and states that the 3 prescriptions that were sent to [facility] today, the pharmacy is telling him they do not have anything from the office. Please advise.

## 2018-03-01 NOTE — Progress Notes (Signed)
I have walked the pt for 6-7 minutes.   10:06 sitting O2 at 90%.   10:07 ~~~91% O2 ~~ HR:130 10:08 ~~~92% O2 ~~ HR:132 10:09 ~~~91% O2 ~~ HR:131 10:10 ~~~92% O2 ~~ HR:133 10:11 ~~~92% O2 ~~ HR:132 10:12 ~~~92% O2 ~~ HR:132  10:19 ~~~94% O2 ~~ HR:125 at sitting

## 2018-03-01 NOTE — Patient Instructions (Signed)
° ° ° °  If you have lab work done today you will be contacted with your lab results within the next 2 weeks.  If you have not heard from us then please contact us. The fastest way to get your results is to register for My Chart. ° ° °IF you received an x-ray today, you will receive an invoice from [facility]. Please contact [facility] at [phone] with questions or concerns regarding your invoice.  ° °IF you received labwork today, you will receive an invoice from [facility]. Please contact [facility] at [phone] with questions or concerns regarding your invoice.  ° °Our billing staff will not be able to assist you with questions regarding bills from these companies. ° °You will be contacted with the lab results as soon as they are available. The fastest way to get your results is to activate your My Chart account. Instructions are located on the last page of this paperwork. If you have not heard from us regarding the results in 2 weeks, please contact this office. °  ° ° ° °

## 2018-03-01 NOTE — Progress Notes (Signed)
2/7/20209:01 AM  Scott [name] [birth year], 58 y.o. male [id number]  Chief Complaint  Patient presents with  . Follow-up    on sleep study and labs  . Medication Refill    Valium and percocet  . Anxiety    score 6  . Depression    score 9    HPI:   Patient is a 58 y.o. male with past medical history significant for DM2,HTNHLP, COPD and chronic painwho presents today forfollowup  Sleep study - no OSA, nocturnal O2 started [facility]  cymbalta 76m was too stimulating, stopped opiate medication stable pmp reviewed  Valium wean too fast, having more anxiety, panic attacks  Lab Results  Component Value Date   HGBA1C 6.1 (H) 12/26/2017   HGBA1C 6.5 (H) 06/01/2013   Lab Results  Component Value Date   LDLCALC 74 10/25/2017   CREATININE 1.27 10/25/2017    Fall Risk  03/01/2018 12/26/2017 11/26/2017 10/25/2017 09/25/2017  Falls in the past year? 0 0 0 No No  Number falls in past yr: 0 - - - -  Injury with Fall? 0 - - - -  Comment - - - - -  Follow up Falls evaluation completed - - - -     [facility] 03/01/2018 12/26/2017 11/26/2017  Decreased Interest 1 0 0  Down, Depressed, Hopeless 1 0 0  PHQ - 2 Score 2 0 0  Altered sleeping 1 - -  Tired, decreased energy 2 - -  Change in appetite 0 - -  Feeling bad or failure about yourself  1 - -  Trouble concentrating 1 - -  Moving slowly or fidgety/restless 2 - -  Suicidal thoughts 0 - -  PHQ-9 Score 9 - -  Difficult doing work/chores Not difficult at all - -    Allergies  Allergen Reactions  . Penicillins Anaphylaxis    Has patient had a PCN reaction causing immediate rash, facial/tongue/throat swelling, SOB or lightheadedness with hypotension: Yes Has patient had a PCN reaction causing severe rash involving mucus membranes or skin necrosis: Yes Has patient had a PCN reaction that required hospitalization: Yes Has patient had a PCN reaction occurring within the last 10 years: No If all of  the above answers are "NO", then may proceed with Cephalosporin use.   .Marland KitchenCymbalta [Duloxetine Hcl]     "crazy thoughts"    Prior to Admission medications   Medication Sig Start Date End Date Taking? Authorizing Provider  Acetaminophen-Aspirin Buffered (EXCEDRIN BACK & BODY PO) Take 1 tablet by mouth daily as needed.   Yes [provider]  cyclobenzaprine (FLEXERIL) 10 MG tablet Take 1 tablet (10 mg total) by mouth 3 (three) times daily as needed for muscle spasms. 11/26/17  Yes SRutherford Guys MD  diazepam (VALIUM) 1 MG/ML solution Take 0.7 mLs (0.7 mg total) by mouth 2 (two) times daily for 14 days, THEN 0.6 mLs (0.6 mg total) 2 (two) times daily for 14 days. 02/07/18 03/07/18 Yes SRutherford Guys MD  Fluticasone-Salmeterol (ADVAIR) 250-50 MCG/DOSE AEPB Inhale 1 puff into the lungs 2 (two) times daily. 10/05/17  Yes BCollene Gobble MD  gabapentin (NEURONTIN) 300 MG capsule TAKE 1 CAPSULE BY MOUTH 3 TIMES DAILY 02/13/18  Yes SRutherford Guys MD  hydrochlorothiazide (HYDRODIURIL) 12.5 MG tablet Take 1 tablet (12.5 mg total) by mouth daily. 10/25/17  Yes SRutherford Guys MD  Ipratropium-Albuterol (COMBIVENT RESPIMAT) 20-100 MCG/ACT AERS respimat INHALE 1 PUFF BY MOUTH INTO THE  LUNGS FOUR TIMES A DAY 12/13/17  Yes Collene Gobble, MD  ipratropium-albuterol (DUONEB) 0.5-2.5 (3) MG/3ML SOLN INHALE 1 VIAL VIA NEBULIZER EVERY 6 HOURS AS DIRECTED FOR SHORTNESS OF BREATH OR WHEEZING. 05/29/16  Yes Byrum, Rose Fillers, MD  ipratropium-albuterol (DUONEB) 0.5-2.5 (3) MG/3ML SOLN INHALE THE CONTENTS OF 1 VIAL VIA NEBULIZER EVERY 6 HOURS AS DIRECTED FOR SHORTNESS OF BREATH OR WHEEZING 10/24/17  Yes Byrum, Rose Fillers, MD  nicotine polacrilex (NICORETTE) 2 MG gum Take 1 each (2 mg total) by mouth as needed for smoking cessation. 10/25/17  Yes Rutherford Guys, MD  omeprazole (PRILOSEC) 40 MG capsule TAKE 1 CAPSULE BY MOUTH EVERY DAY 10/25/17  Yes Rutherford Guys, MD  oxyCODONE-acetaminophen (PERCOCET) 10-325 MG  tablet Take 1 tablet by mouth every 4 (four) hours as needed for pain. 02/07/18  Yes Rutherford Guys, MD  rosuvastatin (CRESTOR) 20 MG tablet Take 0.5 tablets (10 mg total) by mouth daily. 09/25/17  Yes Rutherford Guys, MD  sitaGLIPtin-metformin (JANUMET) 50-500 MG tablet Take 1 tablet by mouth daily. 12/26/17  Yes Rutherford Guys, MD  VENTOLIN HFA 108 (90 Base) MCG/ACT inhaler INHALE 1 TO 2 PUFFS BY MOUTH EVERY 6 HOURS AS NEEDED FOR WHEEZING OR SHORTNESS OF BREATH 09/07/17  Yes Byrum, Rose Fillers, MD  Vitamin D, Ergocalciferol, (DRISDOL) 50000 units CAPS capsule Take 1 capsule (50,000 Units total) by mouth every 7 (seven) days. 10/27/17  Yes Rutherford Guys, MD    Past Medical History:  Diagnosis Date  . 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 COPD (chronic obstructive pulmonary disease) ([place])   . Diabetes mellitus without complication ([place])   . GERD (gastroesophageal reflux disease)   . Headache(784.0)   . Pneumonia    hx    Past Surgical History:  Procedure Laterality Date  . ANTERIOR CERVICAL DECOMP/DISCECTOMY FUSION  12/22/2010   Procedure: ANTERIOR CERVICAL DECOMPRESSION/DISCECTOMY FUSION 3 LEVELS;  Surgeon: Ophelia Charter;  Location: [place] [facility];  Service: Neurosurgery;  Laterality: N/A;  Anterior cervical discectomy with fusion cervical three-four, four-five, and five sixCDF with Interbody Prosthesis, plating, and Bone Graft   . ANTERIOR CERVICAL DECOMP/DISCECTOMY FUSION N/A 10/16/2012   Procedure: CERVICAL SIX-SEVEN ANTERIOR CERVICAL DECOMPRESSION/DISCECTOMY FUSION WITH INTERBODY PROTHESIS PLATING BONEGRAFT WITH /POSSIBLE HARDWARE REMOVAL OLD PLATE;  Surgeon: Ophelia Charter, MD;  Location: [place] [facility];  Service: Neurosurgery;  Laterality: N/A;  . MULTIPLE TOOTH EXTRACTIONS    . OTHER SURGICAL HISTORY     surgery on cheekbone and head for fall 2000  . 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 [facility]     2014 and 2016 - Dr JArnoldo Morale . TONSILLECTOMY      Social History   Tobacco Use  . Smoking status: Current Some Day Smoker    Packs/day: 0.25    Years: 41.00    Pack years: 10.25    Types: Cigarettes    Start date: 03/17/2012    Last attempt to quit: 01/22/2016    Years since quitting: 2.1  . Smokeless tobacco: Never Used  Substance Use Topics  . Alcohol use: No    Alcohol/week: 0.0 standard drinks    Family History  Problem Relation Age of Onset  . Emphysema Mother   . COPD Mother   . Asthma Brother   .  Diabetes Brother   . Hyperlipidemia Brother   . Hypertension Brother   . Asthma Sister   . Hyperlipidemia Sister   . Hypertension Sister   . Mental illness Sister   . Heart disease Maternal Uncle   . Hypertension Brother     Review of Systems  Constitutional: Negative for chills and fever.  Respiratory: Negative for cough and shortness of breath.   Cardiovascular: Positive for palpitations. Negative for chest pain and leg swelling.  Gastrointestinal: Negative for abdominal pain, nausea and vomiting.  Neurological: Negative for dizziness.  Psychiatric/Behavioral: The patient is nervous/anxious.      OBJECTIVE:  Blood pressure 131/86, pulse (!) 124, temperature 99.1 F (37.3 C), temperature source Oral, resp. rate 20, height 5' 8.31" (1.735 m), weight 206 lb 12.8 oz (93.8 kg), SpO2 92 %. Body mass index is 31.16 kg/m.   Pulse Readings from Last 3 Encounters:  03/01/18 (!) 124  01/09/18 (!) 114  12/26/17 (!) 121     Physical Exam Vitals signs and nursing note reviewed.  Constitutional:      Appearance: He is well-developed.  HENT:     Head: Normocephalic and atraumatic.     Right Ear: Hearing, tympanic membrane, ear canal and external ear normal.     Left Ear: Hearing, tympanic membrane, ear canal and external ear normal.     Mouth/Throat:     Pharynx: No oropharyngeal exudate.  Eyes:      Conjunctiva/sclera: Conjunctivae normal.     Pupils: Pupils are equal, round, and reactive to light.  Neck:     Musculoskeletal: Neck supple.  Cardiovascular:     Rate and Rhythm: Regular rhythm. Tachycardia present.     Heart sounds: No murmur. No friction rub. No gallop.   Pulmonary:     Effort: Pulmonary effort is normal.     Breath sounds: Normal breath sounds. No wheezing or rales.  Lymphadenopathy:     Cervical: No cervical adenopathy.  Skin:    General: Skin is warm and dry.  Neurological:     Mental Status: He is alert and oriented to person, place, and time.       6 min walk: never went less than 91% on RA  My interpretation of EKG:  Sinus, HR 116, st changes unchanged from 2015  ASSESSMENT and PLAN  1. Tachycardia Sinus. Does not qualify for daytime O2, starting low dose metoprolol.  - TSH - EKG 12-Lead  2. Medication management - ToxASSURE Select 13 (MW), Urine  3. Diabetes mellitus without complication ([place]) Controlled. Continue current regime.  - Lipid panel - CMP14+EGFR  4. Hyperlipidemia, unspecified hyperlipidemia type Checking labs today, medications will be adjusted as needed.   5. Vitamin D deficiency - VITAMIN D 25 Hydroxy (Vit-D Deficiency, Fractures)  6. Degeneration of lumbar or lumbosacral intervertebral disc Stable. Continue current regime.   7. Long term prescription benzodiazepine use Will slow down wean. Will leave at current dose 0.33m BID for next month.   Other orders - metoprolol succinate (TOPROL-XL) 25 MG 24 hr tablet; Take 1 tablet (25 mg total) by mouth daily. - oxyCODONE-acetaminophen (PERCOCET) 10-325 MG tablet; Take 1 tablet by mouth every 4 (four) hours as needed for pain. - diazepam (VALIUM) 1 MG/ML solution; Take 0.6 mLs (0.6 mg total) by mouth 2 (two) times daily for 30 days.    Return in about 4 weeks (around 03/29/2018).    IRutherford Guys MD [facility] [place] [id number].  [phone] Fax [phone]

## 2018-03-02 LAB — LIPID PANEL
Chol/HDL Ratio: 4 ratio (ref 0.0–5.0)
Cholesterol, Total: 161 mg/dL (ref 100–199)
HDL: 40 mg/dL (ref >39)
LDL Calculated: 89 mg/dL (ref 0–99)
Triglycerides: 162 mg/dL — ABNORMAL HIGH (ref 0–149)
VLDL Cholesterol Cal: 32 mg/dL (ref 5–40)

## 2018-03-02 LAB — CMP14+EGFR
ALT: 17 IU/L (ref 0–44)
AST: 18 IU/L (ref 0–40)
Albumin/Globulin Ratio: 1.8 (ref 1.2–2.2)
Albumin: 4.6 g/dL (ref 3.8–4.9)
Alkaline Phosphatase: 93 IU/L (ref 39–117)
BUN/Creatinine Ratio: 10 (ref 9–20)
BUN: 13 mg/dL (ref 6–24)
Bilirubin Total: 1.3 mg/dL — ABNORMAL HIGH (ref 0.0–1.2)
CO2: 23 mmol/L (ref 20–29)
Calcium: 9.7 mg/dL (ref 8.7–10.2)
Chloride: 98 mmol/L (ref 96–106)
Creatinine, Ser: 1.32 mg/dL — ABNORMAL HIGH (ref 0.76–1.27)
GFR calc Af Amer: 69 mL/min/{1.73_m2} (ref >59)
GFR calc non Af Amer: 59 mL/min/{1.73_m2} — ABNORMAL LOW (ref >59)
Globulin, Total: 2.5 g/dL (ref 1.5–4.5)
Glucose: 97 mg/dL (ref 65–99)
Potassium: 4.2 mmol/L (ref 3.5–5.2)
Sodium: 143 mmol/L (ref 134–144)
Total Protein: 7.1 g/dL (ref 6.0–8.5)

## 2018-03-02 LAB — TSH: TSH: 1.11 u[IU]/mL (ref 0.450–4.500)

## 2018-03-02 LAB — VITAMIN D 25 HYDROXY (VIT D DEFICIENCY, FRACTURES): Vit D, 25-Hydroxy: 23 ng/mL — ABNORMAL LOW (ref 30.0–100.0)

## 2018-03-02 MED ORDER — OXYCODONE-ACETAMINOPHEN 10-325 MG PO TABS: 1.0000 | ORAL_TABLET | ORAL | 0 refills | Status: DC | PRN

## 2018-03-02 MED ORDER — DIAZEPAM 1 MG/ML PO SOLN: 0.6000 mg | Freq: Two times a day (BID) | ORAL | 0 refills | Status: DC

## 2018-03-02 NOTE — Addendum Note (Signed)
Addended by: Myles Lipps on: 03/02/2018 12:38 PM   Modules accepted: Orders

## 2018-03-02 NOTE — Telephone Encounter (Signed)
Rx resent to pharmacy

## 2018-03-06 LAB — TOXASSURE SELECT 13 (MW), URINE

## 2018-03-11 ENCOUNTER — Telehealth: Payer: Self-pay | Admitting: Family Medicine

## 2018-03-11 NOTE — Telephone Encounter (Signed)
Copied from CRM [id number]. Topic: General - Other >> Mar 11, 2018  2:06 PM Gerrianne Scale wrote: Reason for CRM: pt calling about lab results

## 2018-03-12 ENCOUNTER — Telehealth: Payer: Self-pay | Admitting: Family Medicine

## 2018-03-12 ENCOUNTER — Other Ambulatory Visit: Payer: Self-pay | Admitting: Family Medicine

## 2018-03-12 NOTE — Telephone Encounter (Signed)
Called pt Per Dr.Santiago request to have him come in tomorrow 03/13/2018 a 4:20 to go over labs . FR

## 2018-03-13 ENCOUNTER — Ambulatory Visit: Payer: Medicare HMO | Admitting: Family Medicine

## 2018-03-19 ENCOUNTER — Telehealth: Payer: Self-pay

## 2018-03-19 DIAGNOSIS — J449 Chronic obstructive pulmonary disease, unspecified: Secondary | ICD-10-CM | POA: Diagnosis not present

## 2018-03-19 NOTE — Telephone Encounter (Signed)
Medical records sent to [facility], Dr. Roselie Awkward in [place].

## 2018-03-20 ENCOUNTER — Telehealth: Payer: Self-pay | Admitting: *Deleted

## 2018-03-20 ENCOUNTER — Other Ambulatory Visit: Payer: Self-pay | Admitting: Family Medicine

## 2018-03-20 NOTE — Telephone Encounter (Signed)
Pt advised regarding lab results. 

## 2018-03-20 NOTE — Telephone Encounter (Signed)
Per note pt has enough med to last until 03/23/18. Oxycodone-Ace 10-3.25 mg, #150 last filled on 03/02/18. Valium 1 mg #36 ml. Last filled on 03/02/18. Pt's next appt. 03/29/18 @11 :20 am

## 2018-03-20 NOTE — Telephone Encounter (Signed)
Copied from CRM [id number]. Topic: Quick Communication - Rx Refill/Question >> Mar 20, 2018  9:30 AM Crist Infante wrote: Medication: oxyCODONE-acetaminophen (PERCOCET) 10-325 MG tablet diazepam (VALIUM) 1 MG/ML solution  Pt wants the dr to know he only has enough to last until Sat 03/23/18. Pt has appt 03/29/2018 [id number] - [place], [place] - [address] AT [facility] OF [address] [phone] (Phone) [phone] (Fax)

## 2018-03-20 NOTE — Telephone Encounter (Signed)
Left message on voicemail to call the office for lab results.

## 2018-03-22 NOTE — Telephone Encounter (Signed)
Pt calling to find out when this medication will be sent to pharmacy.

## 2018-03-23 NOTE — Telephone Encounter (Signed)
Patient states that he has enough medications to last him until Sunday 03/24/18 but after that he will not have anymore medication.  He needs the Percocet and Valium refilled he called in on 03/20/18

## 2018-03-24 MED ORDER — DIAZEPAM 1 MG/ML PO SOLN: 0.5000 mg | Freq: Two times a day (BID) | ORAL | 0 refills | Status: DC

## 2018-03-24 MED ORDER — OXYCODONE-ACETAMINOPHEN 10-325 MG PO TABS: 1.0000 | ORAL_TABLET | ORAL | 0 refills | Status: DC | PRN

## 2018-03-24 NOTE — Telephone Encounter (Signed)
pmp reviewed meds refilled until appt 03/29/2018 + cocaine on UDS - will discuss at that appt

## 2018-03-29 ENCOUNTER — Encounter: Payer: Self-pay | Admitting: Family Medicine

## 2018-03-29 ENCOUNTER — Ambulatory Visit (INDEPENDENT_AMBULATORY_CARE_PROVIDER_SITE_OTHER): Payer: Medicare HMO | Admitting: Family Medicine

## 2018-03-29 VITALS — BP 120/82 | HR 130 | Temp 99.3°F | Resp 17 | Ht 68.0 in | Wt 204.0 lb

## 2018-03-29 DIAGNOSIS — J449 Chronic obstructive pulmonary disease, unspecified: Secondary | ICD-10-CM

## 2018-03-29 DIAGNOSIS — Z79899 Other long term (current) drug therapy: Secondary | ICD-10-CM | POA: Diagnosis not present

## 2018-03-29 DIAGNOSIS — R000 Tachycardia, unspecified: Secondary | ICD-10-CM

## 2018-03-29 DIAGNOSIS — M5137 Other intervertebral disc degeneration, lumbosacral region: Secondary | ICD-10-CM

## 2018-03-29 DIAGNOSIS — E785 Hyperlipidemia, unspecified: Secondary | ICD-10-CM | POA: Diagnosis not present

## 2018-03-29 DIAGNOSIS — E119 Type 2 diabetes mellitus without complications: Secondary | ICD-10-CM

## 2018-03-29 DIAGNOSIS — Z79891 Long term (current) use of opiate analgesic: Secondary | ICD-10-CM | POA: Diagnosis not present

## 2018-03-29 DIAGNOSIS — M51379 Other intervertebral disc degeneration, lumbosacral region without mention of lumbar back pain or lower extremity pain: Secondary | ICD-10-CM

## 2018-03-29 MED ORDER — OXYCODONE-ACETAMINOPHEN 10-325 MG PO TABS: 1.0000 | ORAL_TABLET | ORAL | 0 refills | Status: DC | PRN

## 2018-03-29 MED ORDER — DIAZEPAM 1 MG/ML PO SOLN: 0.5000 mg | Freq: Two times a day (BID) | ORAL | 0 refills | Status: DC

## 2018-03-29 NOTE — Patient Instructions (Signed)
° ° ° °  If you have lab work done today you will be contacted with your lab results within the next 2 weeks.  If you have not heard from us then please contact us. The fastest way to get your results is to register for My Chart. ° ° °IF you received an x-ray today, you will receive an invoice from [facility]. Please contact [facility] at [phone] with questions or concerns regarding your invoice.  ° °IF you received labwork today, you will receive an invoice from [facility]. Please contact [facility] at [phone] with questions or concerns regarding your invoice.  ° °Our billing staff will not be able to assist you with questions regarding bills from these companies. ° °You will be contacted with the lab results as soon as they are available. The fastest way to get your results is to activate your My Chart account. Instructions are located on the last page of this paperwork. If you have not heard from us regarding the results in 2 weeks, please contact this office. °  ° ° ° °

## 2018-03-29 NOTE — Progress Notes (Signed)
3/6/202012:23 PM  Scott [name] [birth year], 58 y.o. male [id number]  Chief Complaint  Patient presents with  . Diabetes    HPI:   Patient is a 58 y.o. male with past medical history significant for DM2,HTNHLP, COPD and chronic painwho presents today forfollowup  Last OV feb 2020 Started on metoprolol for tachycardia Undergoing valium wean, had to slow down UDS + cocaine, neg THC, appropriate for oxycodone and valium pmp reviewed last prescriptions refilled march 2nd for 6 days Brings in bottles  Patient claims he was doing cannabis lollipop Last use about a week ago He declines ever using cocaine pmp reviewed  Had to stop metoprolol due to sign issues with ED Using oxygen at night and sometimes uses during the day Sees pulm next month  Lab Results  Component Value Date   HGBA1C 6.1 (H) 12/26/2017   HGBA1C 6.5 (H) 06/01/2013   Lab Results  Component Value Date   LDLCALC 89 03/01/2018   CREATININE 1.32 (H) 03/01/2018   Lab Results  Component Value Date   CREATININE 1.32 (H) 03/01/2018   CREATININE 1.27 10/25/2017   CREATININE 1.26 09/19/2017    Fall Risk  03/29/2018 03/01/2018 12/26/2017 11/26/2017 10/25/2017  Falls in the past year? 0 0 0 0 No  Number falls in past yr: - 0 - - -  Injury with Fall? - 0 - - -  Comment - - - - -  Follow up - Falls evaluation completed - - -     Depression screen [facility] 2/9 03/29/2018 03/01/2018 12/26/2017  Decreased Interest 0 1 0  Down, Depressed, Hopeless 0 1 0  PHQ - 2 Score 0 2 0  Altered sleeping 0 1 -  Tired, decreased energy 0 2 -  Change in appetite 0 0 -  Feeling bad or failure about yourself  0 1 -  Trouble concentrating 0 1 -  Moving slowly or fidgety/restless 0 2 -  Suicidal thoughts 0 0 -  PHQ-9 Score 0 9 -  Difficult doing work/chores Not difficult at all Not difficult at all -    Allergies  Allergen Reactions  . Penicillins Anaphylaxis    Has patient had a PCN reaction causing immediate rash,  facial/tongue/throat swelling, SOB or lightheadedness with hypotension: Yes Has patient had a PCN reaction causing severe rash involving mucus membranes or skin necrosis: Yes Has patient had a PCN reaction that required hospitalization: Yes Has patient had a PCN reaction occurring within the last 10 years: No If all of the above answers are "NO", then may proceed with Cephalosporin use.   Marland Kitchen Cymbalta [Duloxetine Hcl]     "crazy thoughts"    Prior to Admission medications   Medication Sig Start Date End Date Taking? Authorizing Provider  Acetaminophen-Aspirin Buffered (EXCEDRIN BACK & BODY PO) Take 1 tablet by mouth daily as needed.   Yes [provider]  cyclobenzaprine (FLEXERIL) 10 MG tablet Take 1 tablet (10 mg total) by mouth 3 (three) times daily as needed for muscle spasms. 11/26/17  Yes Myles Lipps, MD  diazepam (VALIUM) 1 MG/ML solution Take 0.5 mLs (0.5 mg total) by mouth 2 (two) times daily for 6 days. 03/24/18 03/30/18 Yes Myles Lipps, MD  Fluticasone-Salmeterol (ADVAIR) 250-50 MCG/DOSE AEPB Inhale 1 puff into the lungs 2 (two) times daily. 10/05/17  Yes Leslye Peer, MD  gabapentin (NEURONTIN) 300 MG capsule TAKE 1 CAPSULE BY MOUTH 3 TIMES DAILY 02/13/18  Yes Myles Lipps, MD  hydrochlorothiazide (HYDRODIURIL) 12.5 MG  tablet Take 1 tablet (12.5 mg total) by mouth daily. 10/25/17  Yes Myles Lipps, MD  Ipratropium-Albuterol (COMBIVENT RESPIMAT) 20-100 MCG/ACT AERS respimat INHALE 1 PUFF BY MOUTH INTO THE LUNGS FOUR TIMES A DAY 12/13/17  Yes Byrum, Les Pou, MD  ipratropium-albuterol (DUONEB) 0.5-2.5 (3) MG/3ML SOLN INHALE 1 VIAL VIA NEBULIZER EVERY 6 HOURS AS DIRECTED FOR SHORTNESS OF BREATH OR WHEEZING. 05/29/16  Yes Byrum, Les Pou, MD  ipratropium-albuterol (DUONEB) 0.5-2.5 (3) MG/3ML SOLN INHALE THE CONTENTS OF 1 VIAL VIA NEBULIZER EVERY 6 HOURS AS DIRECTED FOR SHORTNESS OF BREATH OR WHEEZING 10/24/17  Yes Byrum, Les Pou, MD  metoprolol succinate (TOPROL-XL) 25  MG 24 hr tablet Take 1 tablet (25 mg total) by mouth daily. 03/01/18  Yes Myles Lipps, MD  nicotine polacrilex (NICORETTE) 2 MG gum CHEW 1 PIECE BY MOUTH AS NEEDED FOR SMOKING CESSATION 03/13/18  Yes Myles Lipps, MD  omeprazole (PRILOSEC) 40 MG capsule TAKE 1 CAPSULE BY MOUTH EVERY DAY 10/25/17  Yes Myles Lipps, MD  oxyCODONE-acetaminophen (PERCOCET) 10-325 MG tablet Take 1 tablet by mouth every 4 (four) hours as needed for pain. 03/24/18  Yes Myles Lipps, MD  rosuvastatin (CRESTOR) 20 MG tablet Take 0.5 tablets (10 mg total) by mouth daily. 09/25/17  Yes Myles Lipps, MD  sitaGLIPtin-metformin (JANUMET) 50-500 MG tablet Take 1 tablet by mouth daily. 12/26/17  Yes Myles Lipps, MD  VENTOLIN HFA 108 (90 Base) MCG/ACT inhaler INHALE 1 TO 2 PUFFS BY MOUTH EVERY 6 HOURS AS NEEDED FOR WHEEZING OR SHORTNESS OF BREATH 09/07/17  Yes Byrum, Les Pou, MD  Vitamin D, Ergocalciferol, (DRISDOL) 50000 units CAPS capsule Take 1 capsule (50,000 Units total) by mouth every 7 (seven) days. 10/27/17  Yes Myles Lipps, MD    Past Medical History:  Diagnosis Date  . 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 COPD (chronic obstructive pulmonary disease) (HCC)   . Diabetes mellitus without complication (HCC)   . GERD (gastroesophageal reflux disease)   . Headache(784.0)   . Pneumonia    hx    Past Surgical History:  Procedure Laterality Date  . ANTERIOR CERVICAL DECOMP/DISCECTOMY FUSION  12/22/2010   Procedure: ANTERIOR CERVICAL DECOMPRESSION/DISCECTOMY FUSION 3 LEVELS;  Surgeon: Cristi Loron;  Location: [facility];  Service: Neurosurgery;  Laterality: N/A;  Anterior cervical discectomy with fusion cervical three-four, four-five, and five sixCDF with Interbody Prosthesis, plating, and Bone Graft   . ANTERIOR CERVICAL DECOMP/DISCECTOMY FUSION N/A 10/16/2012   Procedure: CERVICAL SIX-SEVEN ANTERIOR CERVICAL DECOMPRESSION/DISCECTOMY FUSION WITH INTERBODY PROTHESIS  PLATING BONEGRAFT WITH /POSSIBLE HARDWARE REMOVAL OLD PLATE;  Surgeon: Cristi Loron, MD;  Location: [facility];  Service: Neurosurgery;  Laterality: N/A;  . MULTIPLE TOOTH EXTRACTIONS    . OTHER SURGICAL HISTORY     surgery on cheekbone and head for fall 2000  . 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 SPINE SURGERY     2014 and 2016 - Dr Lovell Sheehan  . TONSILLECTOMY      Social History   Tobacco Use  . Smoking status: Current Some Day Smoker    Packs/day: 0.25    Years: 41.00    Pack years: 10.25    Types: Cigarettes    Start date: 03/17/2012    Last attempt to quit: 01/22/2016  Years since quitting: 2.1  . Smokeless tobacco: Never Used  Substance Use Topics  . Alcohol use: No    Alcohol/week: 0.0 standard drinks    Family History  Problem Relation Age of Onset  . Emphysema Mother   . COPD Mother   . Asthma Brother   . Diabetes Brother   . Hyperlipidemia Brother   . Hypertension Brother   . Asthma Sister   . Hyperlipidemia Sister   . Hypertension Sister   . Mental illness Sister   . Heart disease Maternal Uncle   . Hypertension Brother     ROS Per hpi  OBJECTIVE:  Blood pressure 120/82, pulse (!) 130, temperature 99.3 F (37.4 C), temperature source Oral, resp. rate 17, height 5\' 8"  (1.727 m), weight 204 lb (92.5 kg), SpO2 98 %. Body mass index is 31.02 kg/m.   Physical Exam Vitals signs and nursing note reviewed.  Constitutional:      Appearance: He is well-developed.  HENT:     Head: Normocephalic and atraumatic.  Eyes:     Conjunctiva/sclera: Conjunctivae normal.     Pupils: Pupils are equal, round, and reactive to light.  Neck:     Musculoskeletal: Neck supple.  Pulmonary:     Effort: Pulmonary effort is normal.  Skin:    General: Skin is warm and dry.  Neurological:     Mental Status: He is alert and oriented to person, place, and time.       ASSESSMENT and PLAN  1. Degeneration of lumbar or lumbosacral intervertebral disc 2. Long term prescription benzodiazepine use 6. Long term prescription opiate use 7. Medication management Discussed inappropriate UDS. Reviewed CSA. Rechecking UDS today. Discussed possible referral to addiction med, if cocaine + again. Meds refilled today. bzd wean, current step of 0.5mg  BID x 1 month.  - ToxASSURE Select 13 (MW), Urine  3. Hyperlipidemia, unspecified hyperlipidemia type Controlled. Continue current regime.   4. Diabetes mellitus without complication (HCC) Controlled. Continue current regime.   5. Chronic obstructive pulmonary disease, unspecified COPD type (HCC) Managed by pulm  8. Tachycardia Persistent chronic tachycardia. Sinus per past ekgs, presumed from COPD. Has not had cardiac eval to r/o possible presence of other tachycardias or underlying structural issues. Referral made.  - Care order/instruction: - Ambulatory referral to Cardiology  Other orders - diazepam (VALIUM) 1 MG/ML solution; Take 0.5 mLs (0.5 mg total) by mouth 2 (two) times daily for 30 days. - oxyCODONE-acetaminophen (PERCOCET) 10-325 MG tablet; Take 1 tablet by mouth every 4 (four) hours as needed for pain.  Return in about 4 weeks (around 04/26/2018).    Myles Lipps, MD Primary Care at [facility] [address] [place], [place] [address] Ph.  [phone] Fax [phone]

## 2018-03-30 ENCOUNTER — Telehealth: Payer: Self-pay | Admitting: Family Medicine

## 2018-03-30 NOTE — Telephone Encounter (Signed)
Patient is wanting treatment plan or consultation with  Dr.Santiago wife included [phone] for substance .RF pt wants call from Doc regarding last visit.

## 2018-03-31 DIAGNOSIS — R000 Tachycardia, unspecified: Secondary | ICD-10-CM | POA: Insufficient documentation

## 2018-03-31 NOTE — Progress Notes (Deleted)
Cardiology Office Note    Date:  03/31/2018   ID:  Scott [name], DOB 1961/01/20, MRN [id number]  PCP:  Scott Lipps, MD  Cardiologist:  Scott Magic, MD   No chief complaint on file.   History of Present Illness:  Scott [name] is a 58 y.o. male who is being seen today for the evaluation of unexplained tachycardia at the request of Scott [name], Scott M, MD.  This is a pleasant 58 year old male with a history of COPD, diabetes mellitus, GERD and hyperlipidemia.  He has had problems with chronic tachycardia which was felt to be due to COPD in the past.  He has never had a cardiac evaluation for this.  He saw his PCP on 03/29/2018 after being started on metoprolol a month ago.  At his last office visit a urine drug screen and cocaine were negative he was negative for marijuana as well.  He was cannabis lollipops.  His most recent EKG his heart rate was 130 bpm.  He is now referred for further cardiac evaluation. He denies any chest pain or pressure, SOB, DOE, PND, orthopnea, LE edema, dizziness, palpitations or syncope. He is compliant with his meds and is tolerating meds with no SE.      Past Medical History:  Diagnosis Date  . 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 COPD (chronic obstructive pulmonary disease) (HCC)   . Diabetes mellitus without complication (HCC)   . GERD (gastroesophageal reflux disease)   . Headache(784.0)   . Pneumonia    hx    Past Surgical History:  Procedure Laterality Date  . ANTERIOR CERVICAL DECOMP/DISCECTOMY FUSION  12/22/2010   Procedure: ANTERIOR CERVICAL DECOMPRESSION/DISCECTOMY FUSION 3 LEVELS;  Surgeon: Cristi Loron;  Location: [facility];  Service: Neurosurgery;  Laterality: N/A;  Anterior cervical discectomy with fusion cervical three-four, four-five, and five sixCDF with Interbody Prosthesis, plating, and Bone Graft   . ANTERIOR CERVICAL DECOMP/DISCECTOMY FUSION N/A 10/16/2012   Procedure: CERVICAL SIX-SEVEN ANTERIOR  CERVICAL DECOMPRESSION/DISCECTOMY FUSION WITH INTERBODY PROTHESIS PLATING BONEGRAFT WITH /POSSIBLE HARDWARE REMOVAL OLD PLATE;  Surgeon: Cristi Loron, MD;  Location: [facility];  Service: Neurosurgery;  Laterality: N/A;  . MULTIPLE TOOTH EXTRACTIONS    . OTHER SURGICAL HISTORY     surgery on cheekbone and head for fall 2000  . 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 SPINE SURGERY     2014 and 2016 - Dr Lovell Sheehan  . TONSILLECTOMY      Current Medications: No outpatient medications have been marked as taking for the 04/01/18 encounter (Appointment) with Scott Reichert, MD.    Allergies:   Penicillins and Cymbalta [duloxetine hcl]   Social History   Socioeconomic History  . Marital status: Married    Spouse name: Not on file  . Number of children: 4  . Years of education: Not on file  . Highest education level: Not on file  Occupational History  . Occupation: DISABLED  Social Needs  . Financial resource strain: Not on file  . Food insecurity:    Worry: Not on file    Inability: Not on file  . Transportation needs:    Medical: Not on file    Non-medical: Not on file  Tobacco Use  . Smoking status: Current Some Day Smoker  Packs/day: 0.25    Years: 41.00    Pack years: 10.25    Types: Cigarettes    Start date: 03/17/2012    Last attempt to quit: 01/22/2016    Years since quitting: 2.1  . Smokeless tobacco: Never Used  Substance and Sexual Activity  . Alcohol use: No    Alcohol/week: 0.0 standard drinks  . Drug use: No  . Sexual activity: Not Currently  Lifestyle  . Physical activity:    Days per week: Not on file    Minutes per session: Not on file  . Stress: Not on file  Relationships  . Social connections:    Talks on phone: Not on file    Gets together: Not on file    Attends religious service: Not on file    Active member of club or organization: Not on  file    Attends meetings of clubs or organizations: Not on file    Relationship status: Not on file  Other Topics Concern  . Not on file  Social History Narrative  . Not on file     Family History:  The patient's family history includes Asthma in his brother and sister; COPD in his mother; Diabetes in his brother; Emphysema in his mother; Heart disease in his maternal uncle; Hyperlipidemia in his brother and sister; Hypertension in his brother, brother, and sister; Mental illness in his sister.   ROS:   Please see the history of present illness.    ROS All other systems reviewed and are negative.  No flowsheet data found.     PHYSICAL EXAM:   VS:  There were no vitals taken for this visit.   GEN: Well nourished, well developed, in no acute distress  HEENT: normal  Neck: no JVD, carotid bruits, or masses Cardiac: RRR; no murmurs, rubs, or gallops,no edema.  Intact distal pulses bilaterally.  Respiratory:  clear to auscultation bilaterally, normal work of breathing GI: soft, nontender, nondistended, + BS MS: no deformity or atrophy  Skin: warm and dry, no rash Neuro:  Alert and Oriented x 3, Strength and sensation are intact Psych: euthymic mood, full affect  Wt Readings from Last 3 Encounters:  03/29/18 204 lb (92.5 kg)  03/01/18 206 lb 12.8 oz (93.8 kg)  01/09/18 209 lb (94.8 kg)      Studies/Labs Reviewed:   EKG:  EKG is  ordered today.  The ekg ordered today demonstrates ***  Recent Labs: 09/19/2017: Hemoglobin 17.9; Platelets 288 03/01/2018: ALT 17; BUN 13; Creatinine, Ser 1.32; Potassium 4.2; Sodium 143; TSH 1.110   Lipid Panel    Component Value Date/Time   CHOL 161 03/01/2018 0958   TRIG 162 (H) 03/01/2018 0958   HDL 40 03/01/2018 0958   CHOLHDL 4.0 03/01/2018 0958   LDLCALC 89 03/01/2018 0958    Additional studies/ records that were reviewed today include:  Office notes from PCP and EKG from 02/2018    ASSESSMENT:    1. Tachycardia      PLAN:    In order of problems listed above:  1. Sinus tachycardia -unclear etiology.  This has  been presumed to be due to COPD in the past.  His EKG in February showed sinus tachycardia 116 bpm.  His urine drug screen in February was negative as well.  He denies taking any stimulant drugs, caffeine or over-the-counter cold medications.  TSH was normal at 1.11.  I am going to check a 2D echocardiogram to assess LV function.  I am going  to get an event monitor to assess average heart rate and heart rate range.    Medication Adjustments/Labs and Tests Ordered: Current medicines are reviewed at length with the patient today.  Concerns regarding medicines are outlined above.  Medication changes, Labs and Tests ordered today are listed in the Patient Instructions below.  There are no Patient Instructions on file for this visit.   Signed, Scott Magic, MD  03/31/2018 6:06 PM    [facility] [address] [place], [place], [place]  [address] Phone: [phone]; Fax: [phone]

## 2018-04-01 ENCOUNTER — Ambulatory Visit: Payer: Medicare HMO | Admitting: Cardiology

## 2018-04-01 NOTE — Telephone Encounter (Signed)
Spoke with pt about concerns and he just wanted to follow-up with Dr. Leretha Pol about what to do if he starts having withdraws. I advised pt that he should call the office back with symptoms and speak with a nurse or the doctor so we could continue treatment at a center. Pt states there is no withdraw at this time and has an appointment to follow-up on 04/29/18.

## 2018-04-01 NOTE — Telephone Encounter (Signed)
Called and LVM for pt to call office back about concerns.  

## 2018-04-03 ENCOUNTER — Ambulatory Visit: Payer: Self-pay | Admitting: Emergency Medicine

## 2018-04-04 ENCOUNTER — Emergency Department (HOSPITAL_COMMUNITY): Payer: Medicare HMO

## 2018-04-04 ENCOUNTER — Observation Stay (HOSPITAL_COMMUNITY)
Admission: EM | Admit: 2018-04-04 | Discharge: 2018-04-05 | Disposition: A | Discharge status: Home / Self Care | Payer: Medicare HMO | Attending: Internal Medicine | Admitting: Internal Medicine

## 2018-04-04 ENCOUNTER — Other Ambulatory Visit: Payer: Self-pay

## 2018-04-04 ENCOUNTER — Encounter (HOSPITAL_COMMUNITY): Payer: Self-pay

## 2018-04-04 DIAGNOSIS — Z79891 Long term (current) use of opiate analgesic: Secondary | ICD-10-CM | POA: Diagnosis not present

## 2018-04-04 DIAGNOSIS — Z7982 Long term (current) use of aspirin: Secondary | ICD-10-CM | POA: Insufficient documentation

## 2018-04-04 DIAGNOSIS — R0602 Shortness of breath: Secondary | ICD-10-CM | POA: Diagnosis not present

## 2018-04-04 DIAGNOSIS — J9621 Acute and chronic respiratory failure with hypoxia: Secondary | ICD-10-CM | POA: Insufficient documentation

## 2018-04-04 DIAGNOSIS — R52 Pain, unspecified: Secondary | ICD-10-CM | POA: Diagnosis not present

## 2018-04-04 DIAGNOSIS — J441 Chronic obstructive pulmonary disease with (acute) exacerbation: Secondary | ICD-10-CM | POA: Diagnosis not present

## 2018-04-04 DIAGNOSIS — I10 Essential (primary) hypertension: Secondary | ICD-10-CM | POA: Diagnosis not present

## 2018-04-04 DIAGNOSIS — Z8249 Family history of ischemic heart disease and other diseases of the circulatory system: Secondary | ICD-10-CM | POA: Diagnosis not present

## 2018-04-04 DIAGNOSIS — Z7984 Long term (current) use of oral hypoglycemic drugs: Secondary | ICD-10-CM | POA: Insufficient documentation

## 2018-04-04 DIAGNOSIS — Z880 Allergy status to penicillin: Secondary | ICD-10-CM | POA: Diagnosis not present

## 2018-04-04 DIAGNOSIS — Z836 Family history of other diseases of the respiratory system: Secondary | ICD-10-CM | POA: Insufficient documentation

## 2018-04-04 DIAGNOSIS — R000 Tachycardia, unspecified: Secondary | ICD-10-CM | POA: Diagnosis not present

## 2018-04-04 DIAGNOSIS — Z79899 Other long term (current) drug therapy: Secondary | ICD-10-CM | POA: Diagnosis not present

## 2018-04-04 DIAGNOSIS — K219 Gastro-esophageal reflux disease without esophagitis: Secondary | ICD-10-CM | POA: Diagnosis present

## 2018-04-04 DIAGNOSIS — R0689 Other abnormalities of breathing: Secondary | ICD-10-CM | POA: Diagnosis not present

## 2018-04-04 DIAGNOSIS — Z888 Allergy status to other drugs, medicaments and biological substances status: Secondary | ICD-10-CM | POA: Diagnosis not present

## 2018-04-04 DIAGNOSIS — E785 Hyperlipidemia, unspecified: Secondary | ICD-10-CM | POA: Diagnosis not present

## 2018-04-04 DIAGNOSIS — F17210 Nicotine dependence, cigarettes, uncomplicated: Secondary | ICD-10-CM | POA: Insufficient documentation

## 2018-04-04 DIAGNOSIS — G894 Chronic pain syndrome: Secondary | ICD-10-CM | POA: Diagnosis not present

## 2018-04-04 DIAGNOSIS — E1142 Type 2 diabetes mellitus with diabetic polyneuropathy: Secondary | ICD-10-CM | POA: Insufficient documentation

## 2018-04-04 DIAGNOSIS — E119 Type 2 diabetes mellitus without complications: Secondary | ICD-10-CM

## 2018-04-04 DIAGNOSIS — E1140 Type 2 diabetes mellitus with diabetic neuropathy, unspecified: Secondary | ICD-10-CM | POA: Diagnosis not present

## 2018-04-04 LAB — CBC WITH DIFFERENTIAL/PLATELET
Abs Immature Granulocytes: 0.03 10*3/uL (ref 0.00–0.07)
Basophils Absolute: 0 10*3/uL (ref 0.0–0.1)
Basophils Relative: 0 %
Eosinophils Absolute: 0 10*3/uL (ref 0.0–0.5)
Eosinophils Relative: 0 %
HCT: 49.5 % (ref 39.0–52.0)
Hemoglobin: 15.6 g/dL (ref 13.0–17.0)
Immature Granulocytes: 0 %
LYMPHS ABS: 0.7 10*3/uL (ref 0.7–4.0)
Lymphocytes Relative: 8 %
MCH: 29.3 pg (ref 26.0–34.0)
MCHC: 31.5 g/dL (ref 30.0–36.0)
MCV: 92.9 fL (ref 80.0–100.0)
Monocytes Absolute: 0.3 10*3/uL (ref 0.1–1.0)
Monocytes Relative: 4 %
NRBC: 0 % (ref 0.0–0.2)
Neutro Abs: 7.3 10*3/uL (ref 1.7–7.7)
Neutrophils Relative %: 88 %
Platelets: 208 10*3/uL (ref 150–400)
RBC: 5.33 MIL/uL (ref 4.22–5.81)
RDW: 13.2 % (ref 11.5–15.5)
WBC: 8.4 10*3/uL (ref 4.0–10.5)

## 2018-04-04 LAB — COMPREHENSIVE METABOLIC PANEL
ALT: 21 U/L (ref 0–44)
AST: 22 U/L (ref 15–41)
Albumin: 4 g/dL (ref 3.5–5.0)
Alkaline Phosphatase: 85 U/L (ref 38–126)
Anion gap: 9 (ref 5–15)
BUN: 10 mg/dL (ref 6–20)
CO2: 26 mmol/L (ref 22–32)
Calcium: 8.9 mg/dL (ref 8.9–10.3)
Chloride: 105 mmol/L (ref 98–111)
Creatinine, Ser: 1.03 mg/dL (ref 0.61–1.24)
GFR calc Af Amer: >60 mL/min (ref >60)
GFR calc non Af Amer: >60 mL/min (ref >60)
Glucose, Bld: 130 mg/dL — ABNORMAL HIGH (ref 70–99)
Potassium: 4 mmol/L (ref 3.5–5.1)
Sodium: 140 mmol/L (ref 135–145)
Total Bilirubin: 1.3 mg/dL — ABNORMAL HIGH (ref 0.3–1.2)
Total Protein: 7 g/dL (ref 6.5–8.1)

## 2018-04-04 LAB — RESPIRATORY PANEL BY PCR
Adenovirus: NOT DETECTED
Bordetella pertussis: NOT DETECTED
CORONAVIRUS 229E-RVPPCR: NOT DETECTED
Chlamydophila pneumoniae: NOT DETECTED
Coronavirus HKU1: NOT DETECTED
Coronavirus NL63: NOT DETECTED
Coronavirus OC43: NOT DETECTED
Influenza A: NOT DETECTED
Influenza B: NOT DETECTED
Metapneumovirus: NOT DETECTED
Mycoplasma pneumoniae: NOT DETECTED
PARAINFLUENZA VIRUS 1-RVPPCR: NOT DETECTED
Parainfluenza Virus 2: NOT DETECTED
Parainfluenza Virus 3: NOT DETECTED
Parainfluenza Virus 4: NOT DETECTED
RESPIRATORY SYNCYTIAL VIRUS-RVPPCR: NOT DETECTED
Rhinovirus / Enterovirus: NOT DETECTED

## 2018-04-04 LAB — GLUCOSE, CAPILLARY: Glucose-Capillary: 115 mg/dL — ABNORMAL HIGH (ref 70–99)

## 2018-04-04 LAB — PROCALCITONIN: Procalcitonin: <0.1 ng/mL

## 2018-04-04 LAB — CBG MONITORING, ED: Glucose-Capillary: 203 mg/dL — ABNORMAL HIGH (ref 70–99)

## 2018-04-04 LAB — TOXASSURE SELECT 13 (MW), URINE

## 2018-04-04 MED ORDER — BUDESONIDE 0.5 MG/2ML IN SUSP
0.5000 mg | Freq: Two times a day (BID) | RESPIRATORY_TRACT | Status: DC
  Administered 2018-04-04 – 2018-04-05 (×2): 0.5 mg via RESPIRATORY_TRACT
  Filled 2018-04-04 (×2): qty 2

## 2018-04-04 MED ORDER — OXYCODONE-ACETAMINOPHEN 5-325 MG PO TABS
1.0000 | ORAL_TABLET | ORAL | Status: DC | PRN
  Administered 2018-04-04 – 2018-04-05 (×6): 1 via ORAL
  Filled 2018-04-04 (×6): qty 1

## 2018-04-04 MED ORDER — IPRATROPIUM-ALBUTEROL 0.5-2.5 (3) MG/3ML IN SOLN: 3.0000 mL | RESPIRATORY_TRACT | Status: DC | PRN

## 2018-04-04 MED ORDER — HYDROCHLOROTHIAZIDE 25 MG PO TABS
12.5000 mg | ORAL_TABLET | Freq: Every day | ORAL | Status: DC
  Administered 2018-04-04 – 2018-04-05 (×2): 12.5 mg via ORAL
  Filled 2018-04-04 (×2): qty 1

## 2018-04-04 MED ORDER — OXYCODONE HCL 5 MG PO TABS
5.0000 mg | ORAL_TABLET | ORAL | Status: DC | PRN
  Administered 2018-04-04 – 2018-04-05 (×6): 5 mg via ORAL
  Filled 2018-04-04 (×6): qty 1

## 2018-04-04 MED ORDER — HYDRALAZINE HCL 20 MG/ML IJ SOLN: 10.0000 mg | INTRAMUSCULAR | Status: DC | PRN

## 2018-04-04 MED ORDER — INSULIN ASPART 100 UNIT/ML ~~LOC~~ SOLN
0.0000 [IU] | Freq: Three times a day (TID) | SUBCUTANEOUS | Status: DC
  Administered 2018-04-04: 3 [IU] via SUBCUTANEOUS
  Administered 2018-04-05: 2 [IU] via SUBCUTANEOUS
  Administered 2018-04-05: 3 [IU] via SUBCUTANEOUS
  Filled 2018-04-04: qty 1

## 2018-04-04 MED ORDER — MAGNESIUM SULFATE 2 GM/50ML IV SOLN
2.0000 g | Freq: Once | INTRAVENOUS | Status: AC
  Administered 2018-04-04: 2 g via INTRAVENOUS
  Filled 2018-04-04: qty 50

## 2018-04-04 MED ORDER — IPRATROPIUM-ALBUTEROL 0.5-2.5 (3) MG/3ML IN SOLN
3.0000 mL | Freq: Four times a day (QID) | RESPIRATORY_TRACT | Status: DC
  Administered 2018-04-04 – 2018-04-05 (×5): 3 mL via RESPIRATORY_TRACT
  Filled 2018-04-04 (×4): qty 3

## 2018-04-04 MED ORDER — SENNOSIDES-DOCUSATE SODIUM 8.6-50 MG PO TABS: 2.0000 | ORAL_TABLET | Freq: Every evening | ORAL | Status: DC | PRN

## 2018-04-04 MED ORDER — OXYCODONE-ACETAMINOPHEN 10-325 MG PO TABS: 1.0000 | ORAL_TABLET | ORAL | Status: DC | PRN

## 2018-04-04 MED ORDER — ALBUTEROL SULFATE (2.5 MG/3ML) 0.083% IN NEBU: 2.5000 mg | INHALATION_SOLUTION | Freq: Once | RESPIRATORY_TRACT | Status: DC

## 2018-04-04 MED ORDER — DIAZEPAM 1 MG/ML PO SOLN
0.5000 mg | Freq: Two times a day (BID) | ORAL | Status: DC
  Administered 2018-04-04 – 2018-04-05 (×2): 0.5 mg via ORAL
  Filled 2018-04-04 (×2): qty 5

## 2018-04-04 MED ORDER — METHYLPREDNISOLONE SODIUM SUCC 40 MG IJ SOLR
40.0000 mg | Freq: Three times a day (TID) | INTRAMUSCULAR | Status: DC
  Administered 2018-04-04 – 2018-04-05 (×4): 40 mg via INTRAVENOUS
  Filled 2018-04-04 (×4): qty 1

## 2018-04-04 MED ORDER — CYCLOBENZAPRINE HCL 10 MG PO TABS
10.0000 mg | ORAL_TABLET | Freq: Three times a day (TID) | ORAL | Status: DC | PRN
  Administered 2018-04-04 – 2018-04-05 (×3): 10 mg via ORAL
  Filled 2018-04-04 (×3): qty 1

## 2018-04-04 MED ORDER — GABAPENTIN 300 MG PO CAPS
300.0000 mg | ORAL_CAPSULE | Freq: Three times a day (TID) | ORAL | Status: DC
  Administered 2018-04-04 – 2018-04-05 (×3): 300 mg via ORAL
  Filled 2018-04-04 (×3): qty 1

## 2018-04-04 MED ORDER — PANTOPRAZOLE SODIUM 40 MG PO TBEC
40.0000 mg | DELAYED_RELEASE_TABLET | Freq: Every day | ORAL | Status: DC
  Administered 2018-04-04 – 2018-04-05 (×2): 40 mg via ORAL
  Filled 2018-04-04 (×2): qty 1

## 2018-04-04 MED ORDER — ENOXAPARIN SODIUM 40 MG/0.4ML ~~LOC~~ SOLN
40.0000 mg | SUBCUTANEOUS | Status: DC
  Administered 2018-04-04: 40 mg via SUBCUTANEOUS
  Filled 2018-04-04: qty 0.4

## 2018-04-04 MED ORDER — ROSUVASTATIN CALCIUM 10 MG PO TABS
10.0000 mg | ORAL_TABLET | Freq: Every day | ORAL | Status: DC
  Administered 2018-04-04: 10 mg via ORAL
  Filled 2018-04-04: qty 1

## 2018-04-04 MED ORDER — ROSUVASTATIN CALCIUM 10 MG PO TABS
10.0000 mg | ORAL_TABLET | Freq: Every day | ORAL | Status: DC
  Filled 2018-04-04: qty 1

## 2018-04-04 MED ORDER — ACETAMINOPHEN 650 MG RE SUPP: 650.0000 mg | Freq: Four times a day (QID) | RECTAL | Status: DC | PRN

## 2018-04-04 MED ORDER — IPRATROPIUM-ALBUTEROL 0.5-2.5 (3) MG/3ML IN SOLN
3.0000 mL | Freq: Once | RESPIRATORY_TRACT | Status: DC
  Filled 2018-04-04: qty 3

## 2018-04-04 MED ORDER — ACETAMINOPHEN 325 MG PO TABS: 650.0000 mg | ORAL_TABLET | Freq: Four times a day (QID) | ORAL | Status: DC | PRN

## 2018-04-04 MED ORDER — POLYETHYLENE GLYCOL 3350 17 G PO PACK: 17.0000 g | PACK | Freq: Every day | ORAL | Status: DC | PRN

## 2018-04-04 NOTE — ED Notes (Signed)
Patient transported to X-ray 

## 2018-04-04 NOTE — ED Notes (Signed)
TRANSPORT CALLED FOR ASSISTANCE. TO TAKE PT TO FLOOR

## 2018-04-04 NOTE — ED Notes (Signed)
MEDICATIONS NOT VERIFIED.

## 2018-04-04 NOTE — ED Notes (Addendum)
Pt ambulated with no assist. Pt. O2 was at 89% while walking down the hallway while pt. Pulse was  registering at 125-129. Nurse aware of pt.  Pt. Stated that he felt out of breathe while returning back to room. Patient was placed back on 3 liters of oxygen along with the monitor.

## 2018-04-04 NOTE — ED Triage Notes (Signed)
Per [facility]- Pt c/o of shortness of breath. 02 2lNC baseline. Increased to all day for several says. Increased shortness of breath, chills and back pain for several days. HX of pneumonia in the last years.albuterol, atrovent and solumedrol given in route by EMS. Pt in no acute distress. EDP Zammit at bedside. Down time papers performed.

## 2018-04-04 NOTE — H&P (Signed)
History and Physical    Scott [name] UXL:[id number] DOB: 09/11/1960 DOA: 04/04/2018  PCP: Myles Lipps, MD Patient coming from: Home  Chief Complaint: Shortness of breath  HPI: Scott [name] is a 58 y.o. male with medical history significant of COPD on 2 L nasal cannula, diabetes mellitus type 2, essential hypertension, hyperlipidemia, chronic pain came to the hospital complains of shortness of breath.  Patient states his shortness of breath started day before yesterday evening and progressively worsened over last 24 hours.  This morning he was not even able to move around much without getting short of breath.  Try to use home inhalers without much for relief.  Ended up calling EMS to bring him to the hospital.  Denies any recent travel, fevers, chills and other complaints.  No other sick contacts.  In the ER patient was noted to be hypoxic even while sitting around about 92% on room air.  He was not even able to make it across the room without getting short of breath.  He was given bronchodilator treatment and medical team was requested to admit the patient.   Review of Systems: As per HPI otherwise 10 point review of systems negative.  Review of Systems Otherwise negative except as per HPI, including: General: Denies fever, chills, night sweats or unintended weight loss. Resp: Denies hemoptysis Cardiac: Denies chest pain, palpitations, orthopnea, paroxysmal nocturnal dyspnea. GI: Denies abdominal pain, nausea, vomiting, diarrhea or constipation GU: Denies dysuria, frequency, hesitancy or incontinence MS: Denies muscle aches, joint pain or swelling Neuro: Denies headache, neurologic deficits (focal weakness, numbness, tingling), abnormal gait Psych: Denies anxiety, depression, SI/HI/AVH Skin: Denies new rashes or lesions ID: Denies sick contacts, exotic exposures, travel  Past Medical History:  Diagnosis Date  . 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 COPD  (chronic obstructive pulmonary disease) (HCC)   . Diabetes mellitus without complication (HCC)   . GERD (gastroesophageal reflux disease)   . Headache(784.0)   . Pneumonia    hx    Past Surgical History:  Procedure Laterality Date  . ANTERIOR CERVICAL DECOMP/DISCECTOMY FUSION  12/22/2010   Procedure: ANTERIOR CERVICAL DECOMPRESSION/DISCECTOMY FUSION 3 LEVELS;  Surgeon: Cristi Loron;  Location: [facility];  Service: Neurosurgery;  Laterality: N/A;  Anterior cervical discectomy with fusion cervical three-four, four-five, and five sixCDF with Interbody Prosthesis, plating, and Bone Graft   . ANTERIOR CERVICAL DECOMP/DISCECTOMY FUSION N/A 10/16/2012   Procedure: CERVICAL SIX-SEVEN ANTERIOR CERVICAL DECOMPRESSION/DISCECTOMY FUSION WITH INTERBODY PROTHESIS PLATING BONEGRAFT WITH /POSSIBLE HARDWARE REMOVAL OLD PLATE;  Surgeon: Cristi Loron, MD;  Location: [facility];  Service: Neurosurgery;  Laterality: N/A;  . MULTIPLE TOOTH EXTRACTIONS    . OTHER SURGICAL HISTORY     surgery on cheekbone and head for fall 2000  . 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 SPINE SURGERY     2014 and 2016 - Dr Lovell Sheehan  . TONSILLECTOMY      SOCIAL HISTORY:  reports that he has been smoking cigarettes. He started smoking about 6 years ago. He has a 10.25 pack-year smoking history. He has never used smokeless tobacco. He reports that he does not drink alcohol or use drugs.  Allergies  Allergen Reactions  . Penicillins Anaphylaxis    Has patient had a PCN reaction causing immediate rash, facial/tongue/throat swelling, SOB  or lightheadedness with hypotension: Yes Has patient had a PCN reaction causing severe rash involving mucus membranes or skin necrosis: Yes Has patient had a PCN reaction that required hospitalization: Yes Has patient had a PCN reaction occurring within the last 10 years: No If all of  the above answers are "NO", then may proceed with Cephalosporin use.   Jannette Spanner [Duloxetine Hcl]     "crazy thoughts"    FAMILY HISTORY: Family History  Problem Relation Age of Onset  . Emphysema Mother   . COPD Mother   . Asthma Brother   . Diabetes Brother   . Hyperlipidemia Brother   . Hypertension Brother   . Asthma Sister   . Hyperlipidemia Sister   . Hypertension Sister   . Mental illness Sister   . Heart disease Maternal Uncle   . Hypertension Brother      Prior to Admission medications   Medication Sig Start Date End Date Taking? Authorizing Provider  Acetaminophen-Aspirin Buffered (EXCEDRIN BACK & BODY PO) Take 1 tablet by mouth daily as needed.   Yes [provider]  cyclobenzaprine (FLEXERIL) 10 MG tablet Take 1 tablet (10 mg total) by mouth 3 (three) times daily as needed for muscle spasms. 11/26/17  Yes Myles Lipps, MD  diazepam (VALIUM) 1 MG/ML solution Take 0.5 mLs (0.5 mg total) by mouth 2 (two) times daily for 30 days. 03/29/18 04/28/18 Yes Myles Lipps, MD  Fluticasone-Salmeterol (ADVAIR) 250-50 MCG/DOSE AEPB Inhale 1 puff into the lungs 2 (two) times daily. 10/05/17  Yes Leslye Peer, MD  gabapentin (NEURONTIN) 300 MG capsule TAKE 1 CAPSULE BY MOUTH 3 TIMES DAILY Patient taking differently: Take 300 mg by mouth 3 (three) times daily.  02/13/18  Yes Myles Lipps, MD  hydrochlorothiazide (HYDRODIURIL) 12.5 MG tablet Take 1 tablet (12.5 mg total) by mouth daily. 10/25/17  Yes Myles Lipps, MD  Ipratropium-Albuterol (COMBIVENT RESPIMAT) 20-100 MCG/ACT AERS respimat INHALE 1 PUFF BY MOUTH INTO THE LUNGS FOUR TIMES A DAY Patient taking differently: Inhale 1 puff into the lungs every 6 (six) hours as needed.  12/13/17  Yes Leslye Peer, MD  ipratropium-albuterol (DUONEB) 0.5-2.5 (3) MG/3ML SOLN INHALE 1 VIAL VIA NEBULIZER EVERY 6 HOURS AS DIRECTED FOR SHORTNESS OF BREATH OR WHEEZING. Patient taking differently: Inhale 3 mLs into the lungs  every 6 (six) hours as needed (shortness of breath and wheezing).  05/29/16  Yes Leslye Peer, MD  nicotine polacrilex (NICORETTE) 2 MG gum CHEW 1 PIECE BY MOUTH AS NEEDED FOR SMOKING CESSATION Patient taking differently: Take 2 mg by mouth as needed for smoking cessation.  03/13/18  Yes Myles Lipps, MD  omeprazole (PRILOSEC) 40 MG capsule TAKE 1 CAPSULE BY MOUTH EVERY DAY Patient taking differently: Take 40 mg by mouth daily. TAKE 1 CAPSULE BY MOUTH EVERY DAY 10/25/17  Yes Myles Lipps, MD  oxyCODONE-acetaminophen (PERCOCET) 10-325 MG tablet Take 1 tablet by mouth every 4 (four) hours as needed for pain. 03/29/18  Yes Myles Lipps, MD  rosuvastatin (CRESTOR) 20 MG tablet Take 0.5 tablets (10 mg total) by mouth daily. 09/25/17  Yes Myles Lipps, MD  sitaGLIPtin-metformin (JANUMET) 50-500 MG tablet Take 1 tablet by mouth daily. 12/26/17  Yes Myles Lipps, MD  VENTOLIN HFA 108 (90 Base) MCG/ACT inhaler INHALE 1 TO 2 PUFFS BY MOUTH EVERY 6 HOURS AS NEEDED FOR WHEEZING OR SHORTNESS OF BREATH Patient taking differently: Inhale 1-2 puffs into the lungs every 6 (six) hours  as needed (wheezing or shortness of breath).  09/07/17  Yes Leslye Peer, MD  Vitamin D, Ergocalciferol, (DRISDOL) 50000 units CAPS capsule Take 1 capsule (50,000 Units total) by mouth every 7 (seven) days. 10/27/17  Yes Myles Lipps, MD    Physical Exam: Vitals:   04/04/18 1059 04/04/18 1130 04/04/18 1200 04/04/18 1230  BP: 132/84 (!) 130/94 (!) 134/103 129/89  Pulse: (!) 114 (!) 117 (!) 108 (!) 104  Resp: 18 (!) 21    SpO2: 93% 95% 95% 96%  Weight:      Height:          Constitutional: Slightly uncomfortable due to shortness of breath with minimal movement on his bed Eyes: PERRL, lids and conjunctivae normal ENMT: Mucous membranes are moist. Posterior pharynx clear of any exudate or lesions.Normal dentition.  Neck: normal, supple, no masses, no thyromegaly Respiratory: Diffuse diminished breath sounds  Cardiovascular: Regular rate and rhythm, no murmurs / rubs / gallops. No extremity edema. 2+ pedal pulses. No carotid bruits.  Abdomen: no tenderness, no masses palpated. No hepatosplenomegaly. Bowel sounds positive.  Musculoskeletal: no clubbing / cyanosis. No joint deformity upper and lower extremities. Good ROM, no contractures. Normal muscle tone.  Skin: no rashes, lesions, ulcers. No induration Neurologic: CN 2-12 grossly intact. Sensation intact, DTR normal. Strength 5/5 in all 4.  Psychiatric: Normal judgment and insight. Alert and oriented x 3. Normal mood.     Labs on Admission: I have personally reviewed following labs and imaging studies  CBC: Recent Labs  Lab 04/04/18 1036  WBC 8.4  NEUTROABS 7.3  HGB 15.6  HCT 49.5  MCV 92.9  PLT 208   Basic Metabolic Panel: Recent Labs  Lab 04/04/18 1036  NA 140  K 4.0  CL 105  CO2 26  GLUCOSE 130*  BUN 10  CREATININE 1.03  CALCIUM 8.9   GFR: Estimated Creatinine Clearance: 87.3 mL/min (by C-G formula based on SCr of 1.03 mg/dL). Liver Function Tests: Recent Labs  Lab 04/04/18 1036  AST 22  ALT 21  ALKPHOS 85  BILITOT 1.3*  PROT 7.0  ALBUMIN 4.0   No results for input(s): LIPASE, AMYLASE in the last 168 hours. No results for input(s): AMMONIA in the last 168 hours. Coagulation Profile: No results for input(s): INR, PROTIME in the last 168 hours. Cardiac Enzymes: No results for input(s): CKTOTAL, CKMB, CKMBINDEX, TROPONINI in the last 168 hours. BNP (last 3 results) No results for input(s): PROBNP in the last 8760 hours. HbA1C: No results for input(s): HGBA1C in the last 72 hours. CBG: No results for input(s): GLUCAP in the last 168 hours. Lipid Profile: No results for input(s): CHOL, HDL, LDLCALC, TRIG, CHOLHDL, LDLDIRECT in the last 72 hours. Thyroid Function Tests: No results for input(s): TSH, T4TOTAL, FREET4, T3FREE, THYROIDAB in the last 72 hours. Anemia Panel: No results for input(s): VITAMINB12,  FOLATE, FERRITIN, TIBC, IRON, RETICCTPCT in the last 72 hours. Urine analysis: No results found for: COLORURINE, APPEARANCEUR, LABSPEC, PHURINE, GLUCOSEU, HGBUR, BILIRUBINUR, KETONESUR, PROTEINUR, UROBILINOGEN, NITRITE, LEUKOCYTESUR Sepsis Labs: !!!!!!!!!!!!!!!!!!!!!!!!!!!!!!!!!!!!!!!!!!!! @LABRCNTIP (procalcitonin:4,lacticidven:4) )No results found for this or any previous visit (from the past 240 hour(s)).   Radiological Exams on Admission: Dg Chest 2 View  Result Date: 04/04/2018 CLINICAL DATA:  Shortness breath. EXAM: CHEST - 2 VIEW COMPARISON:  06/03/2013 FINDINGS: Cardiomediastinal silhouette is normal. Mediastinal contours appear intact. There is no evidence of pleural effusion or pneumothorax. Bilateral lower lobe atelectasis versus scarring. Osseous structures are without acute abnormality. Soft tissues are grossly normal.  IMPRESSION: Bilateral lower lobe atelectasis versus scarring. Electronically Signed   By: Ted Mcalpine M.D.   On: 04/04/2018 13:24     All images have been reviewed by me personally.    Assessment/Plan Principal Problem:   COPD with acute exacerbation (HCC) Active Problems:   Hyperlipidemia   Diabetes mellitus without complication (HCC)   GERD (gastroesophageal reflux disease)   Chronic pain syndrome    Acute on chronic hypoxic respiratory failure, 2 L nasal cannula at home Acute mild to moderate exacerbation of mild persistent COPD -Admit the patient to the hospital as he is quite hypoxic and short of breath with minimal movement - Scheduled and as needed bronchodilators, Pulmicort twice daily.  Incentive spirometry and flutter valve - Solu-Medrol IV.  Insulin sliding scale and Accu-Chek - Supportive care. -Check procalcitonin, BNP level  Diabetes mellitus type 2 Peripheral neuropathy secondary to diabetes -Holding off on home p.o. medication.  Insulin sliding scale and Accu-Cheks while he is here.  We will add basal Lantus if necessary.  -Continue gabapentin  GERD -PPI  Essential hypertension -Continue hydrochlorothiazide 12.5 mg orally daily  Chronic pain -On Valium and oxycodone.  Bowel regimen prescribed.  Hyperlipidemia -Continue statin     DVT prophylaxis: Lovenox Code Status: Full code Family Communication: None Disposition Plan: To be determined Consults called: None Admission status: Observation   Time Spent: 65 minutes.  >50% of the time was devoted to discussing the patients care, assessment, plan and disposition with other care givers along with counseling the patient about the risks and benefits of treatment.    Ankit Joline Maxcy MD [facility]  If 7PM-7AM, please contact night-coverage [email or web address]  04/04/2018, 2:40 PM

## 2018-04-04 NOTE — ED Notes (Signed)
ED TO INPATIENT HANDOFF REPORT  ED Nurse Name and Phone #: Margaretmary Dys RN [phone]  S Name/Age/Gender Scott [name] 58 y.o. male Room/Bed: WA13/WA13  Code Status   Code Status: Full Code  Home/SNF/Other Home Patient oriented to: self, place, time and situation Is this baseline? Yes   Triage Complete: Triage complete  Chief Complaint   Triage Note Per [facility]- Pt c/o of shortness of breath. 02 2lNC baseline. Increased to all day for several says. Increased shortness of breath, chills and back pain for several days. HX of pneumonia in the last years.albuterol, atrovent and solumedrol given in route by EMS. Pt in no acute distress. EDP Zammit at bedside. Down time papers performed.    Allergies Allergies  Allergen Reactions  . Penicillins Anaphylaxis    Has patient had a PCN reaction causing immediate rash, facial/tongue/throat swelling, SOB or lightheadedness with hypotension: Yes Has patient had a PCN reaction causing severe rash involving mucus membranes or skin necrosis: Yes Has patient had a PCN reaction that required hospitalization: Yes Has patient had a PCN reaction occurring within the last 10 years: No If all of the above answers are "NO", then may proceed with Cephalosporin use.   Marland Kitchen Cymbalta [Duloxetine Hcl]     "crazy thoughts"    Level of Care/Admitting Diagnosis ED Disposition    ED Disposition Condition Comment   Admit  Hospital Area: [facility] [[id number]]  Level of Care: Med-Surg [16]  Diagnosis: COPD with acute exacerbation [facility]) [[id number]]  Admitting Physician: Stephania Fragmin [facility] [[id number]]  Attending Physician: Stephania Fragmin CHIRAG [[id number]]  PT Class (Do Not Modify): Observation [104]  PT Acc Code (Do Not Modify): Observation [10022]       B Medical/Surgery History Past Medical History:  Diagnosis Date  . 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 COPD (chronic obstructive pulmonary disease) (HCC)   . Diabetes  mellitus without complication (HCC)   . GERD (gastroesophageal reflux disease)   . Headache(784.0)   . Pneumonia    hx   Past Surgical History:  Procedure Laterality Date  . ANTERIOR CERVICAL DECOMP/DISCECTOMY FUSION  12/22/2010   Procedure: ANTERIOR CERVICAL DECOMPRESSION/DISCECTOMY FUSION 3 LEVELS;  Surgeon: Cristi Loron;  Location: [facility];  Service: Neurosurgery;  Laterality: N/A;  Anterior cervical discectomy with fusion cervical three-four, four-five, and five sixCDF with Interbody Prosthesis, plating, and Bone Graft   . ANTERIOR CERVICAL DECOMP/DISCECTOMY FUSION N/A 10/16/2012   Procedure: CERVICAL SIX-SEVEN ANTERIOR CERVICAL DECOMPRESSION/DISCECTOMY FUSION WITH INTERBODY PROTHESIS PLATING BONEGRAFT WITH /POSSIBLE HARDWARE REMOVAL OLD PLATE;  Surgeon: Cristi Loron, MD;  Location: [facility];  Service: Neurosurgery;  Laterality: N/A;  . MULTIPLE TOOTH EXTRACTIONS    . OTHER SURGICAL HISTORY     surgery on cheekbone and head for fall 2000  . 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 SPINE SURGERY     2014 and 2016 - Dr Lovell Sheehan  . TONSILLECTOMY       A IV Location/Drains/Wounds Patient Lines/Drains/Airways Status   Active Line/Drains/Airways    Name:   Placement date:   Placement time:   Site:   Days:   Peripheral IV 04/04/18 Left Antecubital   04/04/18    1102    Antecubital   less than 1   Closed System Drain 1 Neck Bulb (  JP)   10/16/12    1435    Neck   1996   Incision 10/16/12 Neck Other (Comment)   10/16/12    1330     1996          Intake/Output Last 24 hours  Intake/Output Summary (Last 24 hours) at 04/04/2018 1631 Last data filed at 04/04/2018 1220 Gross per 24 hour  Intake 50 ml  Output -  Net 50 ml    Labs/Imaging Results for orders placed or performed during the hospital encounter of 04/04/18 (from the past 48 hour(s))  Comprehensive metabolic  panel     Status: Abnormal   Collection Time: 04/04/18 10:36 AM  Result Value Ref Range   Sodium 140 135 - 145 mmol/L   Potassium 4.0 3.5 - 5.1 mmol/L   Chloride 105 98 - 111 mmol/L   CO2 26 22 - 32 mmol/L   Glucose, Bld 130 (H) 70 - 99 mg/dL   BUN 10 6 - 20 mg/dL   Creatinine, Ser 2.99 0.61 - 1.24 mg/dL   Calcium 8.9 8.9 - 37.1 mg/dL   Total Protein 7.0 6.5 - 8.1 g/dL   Albumin 4.0 3.5 - 5.0 g/dL   AST 22 15 - 41 U/L   ALT 21 0 - 44 U/L   Alkaline Phosphatase 85 38 - 126 U/L   Total Bilirubin 1.3 (H) 0.3 - 1.2 mg/dL   GFR calc non Af Amer >60 >60 mL/min   GFR calc Af Amer >60 >60 mL/min   Anion gap 9 5 - 15    Comment: Performed at [facility], [address], [place], [place] [address]  CBC with Differential     Status: None   Collection Time: 04/04/18 10:36 AM  Result Value Ref Range   WBC 8.4 4.0 - 10.5 K/uL   RBC 5.33 4.22 - 5.81 MIL/uL   Hemoglobin 15.6 13.0 - 17.0 g/dL   HCT 93.8 10.1 - 75.1 %   MCV 92.9 80.0 - 100.0 fL   MCH 29.3 26.0 - 34.0 pg   MCHC 31.5 30.0 - 36.0 g/dL   RDW 02.5 85.2 - 77.8 %   Platelets 208 150 - 400 K/uL   nRBC 0.0 0.0 - 0.2 %   Neutrophils Relative % 88 %   Neutro Abs 7.3 1.7 - 7.7 K/uL   Lymphocytes Relative 8 %   Lymphs Abs 0.7 0.7 - 4.0 K/uL   Monocytes Relative 4 %   Monocytes Absolute 0.3 0.1 - 1.0 K/uL   Eosinophils Relative 0 %   Eosinophils Absolute 0.0 0.0 - 0.5 K/uL   Basophils Relative 0 %   Basophils Absolute 0.0 0.0 - 0.1 K/uL   Immature Granulocytes 0 %   Abs Immature Granulocytes 0.03 0.00 - 0.07 K/uL    Comment: Performed at [facility], [address] [place], [place] [address]  Respiratory Panel by PCR     Status: None   Collection Time: 04/04/18 10:37 AM  Result Value Ref Range   Adenovirus NOT DETECTED NOT DETECTED   Coronavirus 229E NOT DETECTED NOT DETECTED    Comment: (NOTE) The Coronavirus on the Respiratory Panel, DOES NOT test for the novel  Coronavirus (2019  nCoV)    Coronavirus HKU1 NOT DETECTED NOT DETECTED   Coronavirus NL63 NOT DETECTED NOT DETECTED   Coronavirus OC43 NOT DETECTED NOT DETECTED   Metapneumovirus NOT DETECTED NOT DETECTED   Rhinovirus / Enterovirus NOT DETECTED NOT DETECTED   Influenza A  NOT DETECTED NOT DETECTED   Influenza B NOT DETECTED NOT DETECTED   Parainfluenza Virus 1 NOT DETECTED NOT DETECTED   Parainfluenza Virus 2 NOT DETECTED NOT DETECTED   Parainfluenza Virus 3 NOT DETECTED NOT DETECTED   Parainfluenza Virus 4 NOT DETECTED NOT DETECTED   Respiratory Syncytial Virus NOT DETECTED NOT DETECTED   Bordetella pertussis NOT DETECTED NOT DETECTED   Chlamydophila pneumoniae NOT DETECTED NOT DETECTED   Mycoplasma pneumoniae NOT DETECTED NOT DETECTED    Comment: Performed at [facility], [address], [place], [place] [address]   Dg Chest 2 View  Result Date: 04/04/2018 CLINICAL DATA:  Shortness breath. EXAM: CHEST - 2 VIEW COMPARISON:  06/03/2013 FINDINGS: Cardiomediastinal silhouette is normal. Mediastinal contours appear intact. There is no evidence of pleural effusion or pneumothorax. Bilateral lower lobe atelectasis versus scarring. Osseous structures are without acute abnormality. Soft tissues are grossly normal. IMPRESSION: Bilateral lower lobe atelectasis versus scarring. Electronically Signed   By: Ted Mcalpine M.D.   On: 04/04/2018 13:24    Pending Labs Unresulted Labs (From admission, onward)    Start     Ordered   04/11/18 0500  Creatinine, serum  (enoxaparin (LOVENOX)    CrCl >/= 30 ml/min)  Weekly,   R    Comments:  while on enoxaparin therapy    04/04/18 1440   04/05/18 0500  Magnesium  Daily,   R    Question:  Specimen collection method  Answer:  Lab=Lab collect   04/04/18 1420   04/05/18 0500  Basic metabolic panel  Daily,   R    Question:  Specimen collection method  Answer:  Lab=Lab collect   04/04/18 1420   04/05/18 0500  CBC  Daily,   R    Question:  Specimen collection  method  Answer:  Lab=Lab collect   04/04/18 1420   04/05/18 0500  Brain natriuretic peptide  Tomorrow morning,   R     04/04/18 1440   04/04/18 1441  Procalcitonin - Baseline  ONCE - STAT,   STAT     04/04/18 1440   04/04/18 1437  HIV antibody (Routine Testing)  Once,   R     04/04/18 1440          Vitals/Pain Today's Vitals   04/04/18 1230 04/04/18 1450 04/04/18 1553 04/04/18 1554  BP: 129/89  (!) 140/94   Pulse: (!) 104  98   Resp:  (!) 22 (!) 22   SpO2: 96% 91% 94%   Weight:      Height:      PainSc:    0-No pain    Isolation Precautions Droplet precaution  Medications Medications  ipratropium-albuterol (DUONEB) 0.5-2.5 (3) MG/3ML nebulizer solution 3 mL (3 mLs Nebulization Not Given 04/04/18 1433)  albuterol (PROVENTIL) (2.5 MG/3ML) 0.083% nebulizer solution 2.5 mg (2.5 mg Nebulization Not Given 04/04/18 1433)  ipratropium-albuterol (DUONEB) 0.5-2.5 (3) MG/3ML nebulizer solution 3 mL (has no administration in time range)  ipratropium-albuterol (DUONEB) 0.5-2.5 (3) MG/3ML nebulizer solution 3 mL (3 mLs Nebulization Given 04/04/18 1450)  methylPREDNISolone sodium succinate (SOLU-MEDROL) 40 mg/mL injection 40 mg (40 mg Intravenous Given 04/04/18 1434)  budesonide (PULMICORT) nebulizer solution 0.5 mg (has no administration in time range)  senna-docusate (Senokot-S) tablet 2 tablet (has no administration in time range)  hydrALAZINE (APRESOLINE) injection 10 mg (has no administration in time range)  polyethylene glycol (MIRALAX / GLYCOLAX) packet 17 g (has no administration in time range)  oxyCODONE-acetaminophen (PERCOCET) 10-325 MG per tablet  1 tablet (has no administration in time range)  hydrochlorothiazide (HYDRODIURIL) tablet 12.5 mg (has no administration in time range)  rosuvastatin (CRESTOR) tablet 10 mg (has no administration in time range)  diazepam (VALIUM) 1 MG/ML solution 0.5 mg (has no administration in time range)  pantoprazole (PROTONIX) EC tablet 40 mg (has no  administration in time range)  cyclobenzaprine (FLEXERIL) tablet 10 mg (has no administration in time range)  gabapentin (NEURONTIN) capsule 300 mg (has no administration in time range)  enoxaparin (LOVENOX) injection 40 mg (has no administration in time range)  acetaminophen (TYLENOL) tablet 650 mg (has no administration in time range)    Or  acetaminophen (TYLENOL) suppository 650 mg (has no administration in time range)  insulin aspart (novoLOG) injection 0-9 Units (has no administration in time range)  magnesium sulfate IVPB 2 g 50 mL (0 g Intravenous Stopped 04/04/18 1220)    Mobility walks Moderate fall risk   Focused Assessments    R Recommendations: See Admitting Provider Note  Report given to:   Additional Notes:

## 2018-04-04 NOTE — ED Provider Notes (Signed)
[facility]-EMERGENCY DEPT Provider Note   CSN: [id number] Arrival date & time: 04/04/18  1020    History   Chief Complaint Chief Complaint  Patient presents with  . Shortness of Breath  . Chills  . Back Pain    HPI Scott [name] is a 58 y.o. male.     Patient has cough and shortness of breath for a number days.  Patient has history of COPD  The history is provided by the patient. No language interpreter was used.  Shortness of Breath  Severity:  Moderate Onset quality:  Sudden Timing:  Constant Progression:  Worsening Chronicity:  Recurrent Context: activity   Relieved by:  Nothing Associated symptoms: no abdominal pain, no chest pain, no cough, no headaches and no rash   Back Pain  Associated symptoms: no abdominal pain, no chest pain and no headaches     Past Medical History:  Diagnosis Date  . 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 COPD (chronic obstructive pulmonary disease) (HCC)   . Diabetes mellitus without complication (HCC)   . GERD (gastroesophageal reflux disease)   . Headache(784.0)   . Pneumonia    hx    Patient Active Problem List   Diagnosis Date Noted  . COPD with acute exacerbation (HCC) 04/04/2018  . Tachycardia 03/31/2018  . Chronic pain syndrome 08/29/2017  . Tobacco use disorder 05/13/2015  . Sebaceous cyst 01/08/2014  . Hypoxia 06/01/2013  . Acute bronchitis 06/01/2013  . Diabetes mellitus without complication (HCC)   . GERD (gastroesophageal reflux disease)   . Cervical post-laminectomy syndrome 10/02/2012  . Degeneration of lumbar or lumbosacral intervertebral disc 06/01/2011  . COPD (chronic obstructive pulmonary disease) (HCC) 05/30/2011  . Hyperlipidemia 05/30/2011  . Allergic rhinitis 05/30/2011  . Cervical spondylosis with myelopathy 12/22/2010    Past Surgical History:  Procedure Laterality Date  . ANTERIOR CERVICAL DECOMP/DISCECTOMY FUSION  12/22/2010   Procedure: ANTERIOR  CERVICAL DECOMPRESSION/DISCECTOMY FUSION 3 LEVELS;  Surgeon: Cristi Loron;  Location: [facility];  Service: Neurosurgery;  Laterality: N/A;  Anterior cervical discectomy with fusion cervical three-four, four-five, and five sixCDF with Interbody Prosthesis, plating, and Bone Graft   . ANTERIOR CERVICAL DECOMP/DISCECTOMY FUSION N/A 10/16/2012   Procedure: CERVICAL SIX-SEVEN ANTERIOR CERVICAL DECOMPRESSION/DISCECTOMY FUSION WITH INTERBODY PROTHESIS PLATING BONEGRAFT WITH /POSSIBLE HARDWARE REMOVAL OLD PLATE;  Surgeon: Cristi Loron, MD;  Location: [facility];  Service: Neurosurgery;  Laterality: N/A;  . MULTIPLE TOOTH EXTRACTIONS    . OTHER SURGICAL HISTORY     surgery on cheekbone and head for fall 2000  . 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 SPINE SURGERY     2014 and 2016 - Dr Lovell Sheehan  . TONSILLECTOMY          Home Medications    Prior to Admission medications   Medication Sig Start Date End Date Taking? Authorizing Provider  Acetaminophen-Aspirin Buffered (EXCEDRIN BACK & BODY PO) Take 1 tablet by mouth daily as needed.   Yes [provider]  cyclobenzaprine (FLEXERIL) 10 MG tablet Take 1 tablet (10 mg total) by mouth 3 (three) times daily as needed for muscle spasms. 11/26/17  Yes Myles Lipps, MD  diazepam (VALIUM) 1 MG/ML solution Take 0.5 mLs (0.5 mg total) by mouth 2 (two) times daily for 30 days. 03/29/18  04/28/18 Yes Myles Lipps, MD  Fluticasone-Salmeterol (ADVAIR) 250-50 MCG/DOSE AEPB Inhale 1 puff into the lungs 2 (two) times daily. 10/05/17  Yes Leslye Peer, MD  gabapentin (NEURONTIN) 300 MG capsule TAKE 1 CAPSULE BY MOUTH 3 TIMES DAILY Patient taking differently: Take 300 mg by mouth 3 (three) times daily.  02/13/18  Yes Myles Lipps, MD  hydrochlorothiazide (HYDRODIURIL) 12.5 MG tablet Take 1 tablet (12.5 mg total) by mouth daily. 10/25/17  Yes  Myles Lipps, MD  Ipratropium-Albuterol (COMBIVENT RESPIMAT) 20-100 MCG/ACT AERS respimat INHALE 1 PUFF BY MOUTH INTO THE LUNGS FOUR TIMES A DAY Patient taking differently: Inhale 1 puff into the lungs every 6 (six) hours as needed.  12/13/17  Yes Leslye Peer, MD  ipratropium-albuterol (DUONEB) 0.5-2.5 (3) MG/3ML SOLN INHALE 1 VIAL VIA NEBULIZER EVERY 6 HOURS AS DIRECTED FOR SHORTNESS OF BREATH OR WHEEZING. Patient taking differently: Inhale 3 mLs into the lungs every 6 (six) hours as needed (shortness of breath and wheezing).  05/29/16  Yes Leslye Peer, MD  nicotine polacrilex (NICORETTE) 2 MG gum CHEW 1 PIECE BY MOUTH AS NEEDED FOR SMOKING CESSATION Patient taking differently: Take 2 mg by mouth as needed for smoking cessation.  03/13/18  Yes Myles Lipps, MD  omeprazole (PRILOSEC) 40 MG capsule TAKE 1 CAPSULE BY MOUTH EVERY DAY Patient taking differently: Take 40 mg by mouth daily. TAKE 1 CAPSULE BY MOUTH EVERY DAY 10/25/17  Yes Myles Lipps, MD  oxyCODONE-acetaminophen (PERCOCET) 10-325 MG tablet Take 1 tablet by mouth every 4 (four) hours as needed for pain. 03/29/18  Yes Myles Lipps, MD  rosuvastatin (CRESTOR) 20 MG tablet Take 0.5 tablets (10 mg total) by mouth daily. 09/25/17  Yes Myles Lipps, MD  sitaGLIPtin-metformin (JANUMET) 50-500 MG tablet Take 1 tablet by mouth daily. 12/26/17  Yes Myles Lipps, MD  VENTOLIN HFA 108 (90 Base) MCG/ACT inhaler INHALE 1 TO 2 PUFFS BY MOUTH EVERY 6 HOURS AS NEEDED FOR WHEEZING OR SHORTNESS OF BREATH Patient taking differently: Inhale 1-2 puffs into the lungs every 6 (six) hours as needed (wheezing or shortness of breath).  09/07/17  Yes Leslye Peer, MD  Vitamin D, Ergocalciferol, (DRISDOL) 50000 units CAPS capsule Take 1 capsule (50,000 Units total) by mouth every 7 (seven) days. 10/27/17  Yes Myles Lipps, MD    Family History Family History  Problem Relation Age of Onset  . Emphysema Mother   . COPD Mother   . Asthma  Brother   . Diabetes Brother   . Hyperlipidemia Brother   . Hypertension Brother   . Asthma Sister   . Hyperlipidemia Sister   . Hypertension Sister   . Mental illness Sister   . Heart disease Maternal Uncle   . Hypertension Brother     Social History Social History   Tobacco Use  . Smoking status: Current Some Day Smoker    Packs/day: 0.25    Years: 41.00    Pack years: 10.25    Types: Cigarettes    Start date: 03/17/2012    Last attempt to quit: 01/22/2016    Years since quitting: 2.2  . Smokeless tobacco: Never Used  Substance Use Topics  . Alcohol use: No    Alcohol/week: 0.0 standard drinks  . Drug use: No     Allergies   Penicillins and Cymbalta [duloxetine hcl]   Review of Systems Review of Systems  Constitutional: Negative for appetite change and fatigue.  HENT: Negative for  congestion, ear discharge and sinus pressure.   Eyes: Negative for discharge.  Respiratory: Positive for shortness of breath. Negative for cough.   Cardiovascular: Negative for chest pain.  Gastrointestinal: Negative for abdominal pain and diarrhea.  Genitourinary: Negative for frequency and hematuria.  Musculoskeletal: Positive for back pain.  Skin: Negative for rash.  Neurological: Negative for seizures and headaches.  Psychiatric/Behavioral: Negative for hallucinations.     Physical Exam Updated Vital Signs BP (!) 130/94   Pulse (!) 117   Resp (!) 21   Ht 5\' 8"  (1.727 m)   Wt 92.5 kg   SpO2 95%   BMI 31.01 kg/m   Physical Exam Vitals signs and nursing note reviewed.  Constitutional:      Appearance: He is well-developed.  HENT:     Head: Normocephalic.     Nose: Nose normal.  Eyes:     General: No scleral icterus.    Conjunctiva/sclera: Conjunctivae normal.  Neck:     Musculoskeletal: Neck supple.     Thyroid: No thyromegaly.  Cardiovascular:     Rate and Rhythm: Normal rate and regular rhythm.     Heart sounds: No murmur. No friction rub. No gallop.    Pulmonary:     Breath sounds: No stridor. Wheezing present. No rales.  Chest:     Chest wall: No tenderness.  Abdominal:     General: There is no distension.     Tenderness: There is no abdominal tenderness. There is no rebound.  Musculoskeletal: Normal range of motion.  Lymphadenopathy:     Cervical: No cervical adenopathy.  Skin:    Findings: No erythema or rash.  Neurological:     Mental Status: He is oriented to person, place, and time.     Motor: No abnormal muscle tone.     Coordination: Coordination normal.  Psychiatric:        Behavior: Behavior normal.      ED Treatments / Results  Labs (all labs ordered are listed, but only abnormal results are displayed) Labs Reviewed  COMPREHENSIVE METABOLIC PANEL - Abnormal; Notable for the following components:      Result Value   Glucose, Bld 130 (*)    Total Bilirubin 1.3 (*)    All other components within normal limits  RESPIRATORY PANEL BY PCR  CBC WITH DIFFERENTIAL/PLATELET    EKG None  Radiology Dg Chest 2 View  Result Date: 04/04/2018 CLINICAL DATA:  Shortness breath. EXAM: CHEST - 2 VIEW COMPARISON:  06/03/2013 FINDINGS: Cardiomediastinal silhouette is normal. Mediastinal contours appear intact. There is no evidence of pleural effusion or pneumothorax. Bilateral lower lobe atelectasis versus scarring. Osseous structures are without acute abnormality. Soft tissues are grossly normal. IMPRESSION: Bilateral lower lobe atelectasis versus scarring. Electronically Signed   By: Ted Mcalpine M.D.   On: 04/04/2018 13:24    Procedures Procedures (including critical care time)  Medications Ordered in ED Medications  ipratropium-albuterol (DUONEB) 0.5-2.5 (3) MG/3ML nebulizer solution 3 mL (has no administration in time range)  albuterol (PROVENTIL) (2.5 MG/3ML) 0.083% nebulizer solution 2.5 mg (has no administration in time range)  ipratropium-albuterol (DUONEB) 0.5-2.5 (3) MG/3ML nebulizer solution 3 mL (has no  administration in time range)  ipratropium-albuterol (DUONEB) 0.5-2.5 (3) MG/3ML nebulizer solution 3 mL (has no administration in time range)  methylPREDNISolone sodium succinate (SOLU-MEDROL) 40 mg/mL injection 40 mg (has no administration in time range)  budesonide (PULMICORT) nebulizer solution 0.5 mg (has no administration in time range)  senna-docusate (Senokot-S) tablet 2 tablet (has no  administration in time range)  hydrALAZINE (APRESOLINE) injection 10 mg (has no administration in time range)  polyethylene glycol (MIRALAX / GLYCOLAX) packet 17 g (has no administration in time range)  magnesium sulfate IVPB 2 g 50 mL (0 g Intravenous Stopped 04/04/18 1220)     Initial Impression / Assessment and Plan / ED Course  I have reviewed the triage vital signs and the nursing notes.  Pertinent labs & imaging results that were available during my care of the patient were reviewed by me and considered in my medical decision making (see chart for details).     Patient with exacerbation of COPD.  Hypoxia on exertion.  He will admitted to medicine  Final Clinical Impressions(s) / ED Diagnoses   Final diagnoses:  None    ED Discharge Orders    None       Bethann Berkshire, MD 04/04/18 1424

## 2018-04-04 NOTE — ED Notes (Signed)
Bed: WA13 Expected date:  Expected time:  Means of arrival:  Comments: EMS-[facility] 

## 2018-04-05 DIAGNOSIS — E785 Hyperlipidemia, unspecified: Secondary | ICD-10-CM | POA: Diagnosis not present

## 2018-04-05 DIAGNOSIS — E119 Type 2 diabetes mellitus without complications: Secondary | ICD-10-CM | POA: Diagnosis not present

## 2018-04-05 DIAGNOSIS — G894 Chronic pain syndrome: Secondary | ICD-10-CM

## 2018-04-05 DIAGNOSIS — J441 Chronic obstructive pulmonary disease with (acute) exacerbation: Secondary | ICD-10-CM | POA: Diagnosis not present

## 2018-04-05 LAB — BASIC METABOLIC PANEL
ANION GAP: 11 (ref 5–15)
BUN: 21 mg/dL — ABNORMAL HIGH (ref 6–20)
CO2: 24 mmol/L (ref 22–32)
Calcium: 9.1 mg/dL (ref 8.9–10.3)
Chloride: 103 mmol/L (ref 98–111)
Creatinine, Ser: 1.12 mg/dL (ref 0.61–1.24)
GFR calc non Af Amer: >60 mL/min (ref >60)
Glucose, Bld: 210 mg/dL — ABNORMAL HIGH (ref 70–99)
Potassium: 3.9 mmol/L (ref 3.5–5.1)
Sodium: 138 mmol/L (ref 135–145)

## 2018-04-05 LAB — HIV ANTIBODY (ROUTINE TESTING W REFLEX): HIV Screen 4th Generation wRfx: NONREACTIVE

## 2018-04-05 LAB — CBC
HCT: 48.6 % (ref 39.0–52.0)
Hemoglobin: 15.1 g/dL (ref 13.0–17.0)
MCH: 29.2 pg (ref 26.0–34.0)
MCHC: 31.1 g/dL (ref 30.0–36.0)
MCV: 93.8 fL (ref 80.0–100.0)
Platelets: 238 10*3/uL (ref 150–400)
RBC: 5.18 MIL/uL (ref 4.22–5.81)
RDW: 13.2 % (ref 11.5–15.5)
WBC: 12.7 10*3/uL — ABNORMAL HIGH (ref 4.0–10.5)
nRBC: 0 % (ref 0.0–0.2)

## 2018-04-05 LAB — GLUCOSE, CAPILLARY
Glucose-Capillary: 160 mg/dL — ABNORMAL HIGH (ref 70–99)
Glucose-Capillary: 213 mg/dL — ABNORMAL HIGH (ref 70–99)

## 2018-04-05 LAB — MAGNESIUM: Magnesium: 2 mg/dL (ref 1.7–2.4)

## 2018-04-05 LAB — BRAIN NATRIURETIC PEPTIDE: B Natriuretic Peptide: 609.6 pg/mL — ABNORMAL HIGH (ref 0.0–100.0)

## 2018-04-05 MED ORDER — PREDNISONE 10 MG PO TABS: ORAL_TABLET | ORAL | Status: DC

## 2018-04-05 NOTE — Progress Notes (Signed)
RN reviewed discharge instructions with patient and family.   All questions answered.   Patient taken down to family car by NT.

## 2018-04-05 NOTE — Plan of Care (Signed)
Plan of care reviewed and discussed with the patient. 

## 2018-04-05 NOTE — TOC Initial Note (Signed)
Transition of Care [facility]) - Initial/Assessment Note    Patient Details  Name: Moon Reichert MRN: [id number] Date of Birth: Jan 28, 1960  [facility] CM/SW Contact:    Golda Acre, RN Phone Number: 04/05/2018, 10:32 AM  Clinical Narrative:                 Copd exacerbation and shortness of breath  Expected Discharge Plan: Home/Self Care Barriers to Discharge: No Barriers Identified   Patient Goals and CMS Choice Patient states their goals for this hospitalization and ongoing recovery are:: i want to be ble to breath better and get my breathing under control      Expected Discharge Plan and Services Expected Discharge Plan: Home/Self Care Discharge Planning Services: CM Consult   Living arrangements for the past 2 months: Single Family Home Expected Discharge Date: (unknown)                        Prior Living Arrangements/Services Living arrangements for the past 2 months: Single Family Home Lives with:: Spouse Patient language and need for interpreter reviewed:: No        Need for Family Participation in Patient Care: No (Comment) Care giver support system in place?: Yes (comment)   Criminal Activity/Legal Involvement Pertinent to Current Situation/Hospitalization: No - Comment as needed  Activities of Daily Living Home Assistive Devices/Equipment: Eyeglasses, Dentures (specify type), Nebulizer, Oxygen(upper denture, lower partial plate) ADL Screening (condition at time of admission) Patient's cognitive ability adequate to safely complete daily activities?: Yes Is the patient deaf or have difficulty hearing?: No Does the patient have difficulty seeing, even when wearing glasses/contacts?: No Does the patient have difficulty concentrating, remembering, or making decisions?: No Patient able to express need for assistance with ADLs?: Yes Does the patient have difficulty dressing or bathing?: No Independently performs ADLs?: Yes (appropriate for  developmental age) Does the patient have difficulty walking or climbing stairs?: Yes(secondary to shortness of breath) Weakness of Legs: Both Weakness of Arms/Hands: None  Permission Sought/Granted                  Emotional Assessment Appearance:: Appears stated age   Affect (typically observed): Accepting Orientation: : Oriented to Self, Oriented to Place, Oriented to  Time, Oriented to Situation Alcohol / Substance Use: Tobacco Use, Alcohol Use    Admission diagnosis:  Patient Active Problem List   Diagnosis Date Noted  . COPD with acute exacerbation (HCC) 04/04/2018  . Tachycardia 03/31/2018  . Chronic pain syndrome 08/29/2017  . Tobacco use disorder 05/13/2015  . Sebaceous cyst 01/08/2014  . Hypoxia 06/01/2013  . Acute bronchitis 06/01/2013  . Diabetes mellitus without complication (HCC)   . GERD (gastroesophageal reflux disease)   . Cervical post-laminectomy syndrome 10/02/2012  . Degeneration of lumbar or lumbosacral intervertebral disc 06/01/2011  . COPD (chronic obstructive pulmonary disease) (HCC) 05/30/2011  . Hyperlipidemia 05/30/2011  . Allergic rhinitis 05/30/2011  . Cervical spondylosis with myelopathy 12/22/2010   PCP:  Myles Lipps, MD Pharmacy:   [facility] [phone] [place], [place] - [phone] AT [facility] & [address] [place] [address] Phone: [phone] Fax: [phone]     Social Determinants of Health (SDOH) Interventions    Readmission Risk Interventions  No flowsheet data found.

## 2018-04-05 NOTE — Discharge Summary (Signed)
Physician Discharge Summary  Scott [name] HDQ:[id number] DOB: 1960/07/25 DOA: 04/04/2018  PCP: Myles Lipps, MD  Admit date: 04/04/2018 Discharge date: 04/05/2018  Admitted From: Home Disposition:  Home  Recommendations for Outpatient Follow-up:  1. Follow up with PCP in 1-2 weeks 2. Follow up with Pulmonary as scheduled already  Discharge Condition:Improved CODE STATUS:Full Diet recommendation: Diabetic   Brief/Interim Summary: 58 y.o. male with medical history significant of COPD on 2 L nasal cannula, diabetes mellitus type 2, essential hypertension, hyperlipidemia, chronic pain came to the hospital complains of shortness of breath.  Patient states his shortness of breath started day before yesterday evening and progressively worsened over last 24 hours.  This morning he was not even able to move around much without getting short of breath.  Try to use home inhalers without much for relief.  Ended up calling EMS to bring him to the hospital.  Denies any recent travel, fevers, chills and other complaints.  No other sick contacts.  In the ER patient was noted to be hypoxic even while sitting around about 92% on room air.  He was not even able to make it across the room without getting short of breath.  He was given bronchodilator treatment and medical team was requested to admit the patient.   Discharge Diagnoses:  Principal Problem:   COPD with acute exacerbation (HCC) Active Problems:   Hyperlipidemia   Diabetes mellitus without complication (HCC)   GERD (gastroesophageal reflux disease)   Chronic pain syndrome  Acute on chronic hypoxic respiratory failure, 2 L nasal cannula at home Acute mild to moderate exacerbation of mild persistent COPD -Initially noted to be hypoxic and short of breath with minimal movement - Improved overnight with scheduled and as needed bronchodilators, Pulmicort twice daily.  Incentive spirometry and flutter valve - Given Solu-Medrol IV. - Weaned  O2 to room air, patient ambulated in hallway on room air - Patient to follow up with Pulmonary as already scheduled  Diabetes mellitus type 2 Peripheral neuropathy secondary to diabetes -Holding off on home p.o. medication while in hospital.. -Continue gabapentin  GERD -PPI  Essential hypertension -Continue hydrochlorothiazide 12.5 mg orally daily  Chronic pain -On Valium and oxycodone.  Bowel regimen prescribed.  Hyperlipidemia -Continue statin   Discharge Instructions   Allergies as of 04/05/2018      Reactions   Penicillins Anaphylaxis   Has patient had a PCN reaction causing immediate rash, facial/tongue/throat swelling, SOB or lightheadedness with hypotension: Yes Has patient had a PCN reaction causing severe rash involving mucus membranes or skin necrosis: Yes Has patient had a PCN reaction that required hospitalization: Yes Has patient had a PCN reaction occurring within the last 10 years: No If all of the above answers are "NO", then may proceed with Cephalosporin use.   Cymbalta [duloxetine Hcl]    "crazy thoughts"      Medication List    TAKE these medications   cyclobenzaprine 10 MG tablet Commonly known as:  FLEXERIL Take 1 tablet (10 mg total) by mouth 3 (three) times daily as needed for muscle spasms.   diazepam 1 MG/ML solution Commonly known as:  VALIUM Take 0.5 mLs (0.5 mg total) by mouth 2 (two) times daily for 30 days.   EXCEDRIN BACK & BODY PO Take 1 tablet by mouth daily as needed.   Fluticasone-Salmeterol 250-50 MCG/DOSE Aepb Commonly known as:  ADVAIR Inhale 1 puff into the lungs 2 (two) times daily.   gabapentin 300 MG capsule Commonly known as:  NEURONTIN TAKE 1 CAPSULE BY MOUTH 3 TIMES DAILY   hydrochlorothiazide 12.5 MG tablet Commonly known as:  HYDRODIURIL Take 1 tablet (12.5 mg total) by mouth daily.   ipratropium-albuterol 0.5-2.5 (3) MG/3ML Soln Commonly known as:  DUONEB INHALE 1 VIAL VIA NEBULIZER EVERY 6 HOURS AS  DIRECTED FOR SHORTNESS OF BREATH OR WHEEZING. What changed:    how much to take  how to take this  when to take this  reasons to take this  additional instructions   Ipratropium-Albuterol 20-100 MCG/ACT Aers respimat Commonly known as:  Combivent Respimat INHALE 1 PUFF BY MOUTH INTO THE LUNGS FOUR TIMES A DAY What changed:    how much to take  how to take this  when to take this  reasons to take this  additional instructions   nicotine polacrilex 2 MG gum Commonly known as:  NICORETTE CHEW 1 PIECE BY MOUTH AS NEEDED FOR SMOKING CESSATION What changed:  See the new instructions.   omeprazole 40 MG capsule Commonly known as:  PRILOSEC TAKE 1 CAPSULE BY MOUTH EVERY DAY What changed:    how much to take  how to take this  when to take this   oxyCODONE-acetaminophen 10-325 MG tablet Commonly known as:  PERCOCET Take 1 tablet by mouth every 4 (four) hours as needed for pain.   predniSONE 10 MG tablet Commonly known as:  DELTASONE Taper dose: 60mg  po daily x 3 days, then 40mg  po daily x 3 days, then 20mg  po daily x 3 days, then 10mg  po daily x 3 days, then 5mg  po daily x 2 days, then stop. Zero refills   rosuvastatin 20 MG tablet Commonly known as:  CRESTOR Take 0.5 tablets (10 mg total) by mouth daily.   sitaGLIPtin-metformin 50-500 MG tablet Commonly known as:  JANUMET Take 1 tablet by mouth daily.   Ventolin HFA 108 (90 Base) MCG/ACT inhaler Generic drug:  albuterol INHALE 1 TO 2 PUFFS BY MOUTH EVERY 6 HOURS AS NEEDED FOR WHEEZING OR SHORTNESS OF BREATH What changed:    how much to take  how to take this  when to take this  reasons to take this  additional instructions   Vitamin D (Ergocalciferol) 1.25 MG (50000 UT) Caps capsule Commonly known as:  DRISDOL Take 1 capsule (50,000 Units total) by mouth every 7 (seven) days.      Follow-up Information    Myles Lipps, MD. Schedule an appointment as soon as possible for a visit in 1  week(s).   Specialty:  Family Medicine Contact information: [address] [place] [address] [phone]        Leslye Peer, MD Follow up.   Specialty:  Pulmonary Disease Why:  follow up as already scheduled Contact information: [address] [place] [address] [phone]          Allergies  Allergen Reactions  . Penicillins Anaphylaxis    Has patient had a PCN reaction causing immediate rash, facial/tongue/throat swelling, SOB or lightheadedness with hypotension: Yes Has patient had a PCN reaction causing severe rash involving mucus membranes or skin necrosis: Yes Has patient had a PCN reaction that required hospitalization: Yes Has patient had a PCN reaction occurring within the last 10 years: No If all of the above answers are "NO", then may proceed with Cephalosporin use.   Jannette Spanner [Duloxetine Hcl]     "crazy thoughts"    Procedures/Studies: Dg Chest 2 View  Result Date: 04/04/2018 CLINICAL DATA:  Shortness  breath. EXAM: CHEST - 2 VIEW COMPARISON:  06/03/2013 FINDINGS: Cardiomediastinal silhouette is normal. Mediastinal contours appear intact. There is no evidence of pleural effusion or pneumothorax. Bilateral lower lobe atelectasis versus scarring. Osseous structures are without acute abnormality. Soft tissues are grossly normal. IMPRESSION: Bilateral lower lobe atelectasis versus scarring. Electronically Signed   By: Ted Mcalpine M.D.   On: 04/04/2018 13:24     Subjective: Eager to go home  Discharge Exam: Vitals:   04/05/18 1150 04/05/18 1151  BP:    Pulse: (!) 125 (!) 125  Resp:    Temp:    SpO2: 94% 90%   Vitals:   04/05/18 0815 04/05/18 0900 04/05/18 1150 04/05/18 1151  BP:      Pulse:   (!) 125 (!) 125  Resp:      Temp:      TempSrc:      SpO2: 93%  94% 90%  Weight:  92.5 kg    Height:        General: Pt is alert, awake, not in acute distress Cardiovascular: RRR, S1/S2 +, no rubs, no  gallops Respiratory: CTA bilaterally, no wheezing, no rhonchi Abdominal: Soft, NT, ND, bowel sounds + Extremities: no edema, no cyanosis   The results of significant diagnostics from this hospitalization (including imaging, microbiology, ancillary and laboratory) are listed below for reference.     Microbiology: Recent Results (from the past 240 hour(s))  Respiratory Panel by PCR     Status: None   Collection Time: 04/04/18 10:37 AM  Result Value Ref Range Status   Adenovirus NOT DETECTED NOT DETECTED Final   Coronavirus 229E NOT DETECTED NOT DETECTED Final    Comment: (NOTE) The Coronavirus on the Respiratory Panel, DOES NOT test for the novel  Coronavirus (2019 nCoV)    Coronavirus HKU1 NOT DETECTED NOT DETECTED Final   Coronavirus NL63 NOT DETECTED NOT DETECTED Final   Coronavirus OC43 NOT DETECTED NOT DETECTED Final   Metapneumovirus NOT DETECTED NOT DETECTED Final   Rhinovirus / Enterovirus NOT DETECTED NOT DETECTED Final   Influenza A NOT DETECTED NOT DETECTED Final   Influenza B NOT DETECTED NOT DETECTED Final   Parainfluenza Virus 1 NOT DETECTED NOT DETECTED Final   Parainfluenza Virus 2 NOT DETECTED NOT DETECTED Final   Parainfluenza Virus 3 NOT DETECTED NOT DETECTED Final   Parainfluenza Virus 4 NOT DETECTED NOT DETECTED Final   Respiratory Syncytial Virus NOT DETECTED NOT DETECTED Final   Bordetella pertussis NOT DETECTED NOT DETECTED Final   Chlamydophila pneumoniae NOT DETECTED NOT DETECTED Final   Mycoplasma pneumoniae NOT DETECTED NOT DETECTED Final    Comment: Performed at [facility], [address] [place], [place] [address]     Labs: BNP (last 3 results) Recent Labs    04/05/18 0510  BNP 609.6*   Basic Metabolic Panel: Recent Labs  Lab 04/04/18 1036 04/05/18 0510  NA 140 138  K 4.0 3.9  CL 105 103  CO2 26 24  GLUCOSE 130* 210*  BUN 10 21*  CREATININE 1.03 1.12  CALCIUM 8.9 9.1  MG  --  2.0   Liver Function Tests: Recent Labs   Lab 04/04/18 1036  AST 22  ALT 21  ALKPHOS 85  BILITOT 1.3*  PROT 7.0  ALBUMIN 4.0   No results for input(s): LIPASE, AMYLASE in the last 168 hours. No results for input(s): AMMONIA in the last 168 hours. CBC: Recent Labs  Lab 04/04/18 1036 04/05/18 0510  WBC 8.4 12.7*  NEUTROABS 7.3  --   HGB 15.6 15.1  HCT 49.5 48.6  MCV 92.9 93.8  PLT 208 238   Cardiac Enzymes: No results for input(s): CKTOTAL, CKMB, CKMBINDEX, TROPONINI in the last 168 hours. BNP: Invalid input(s): POCBNP CBG: Recent Labs  Lab 04/04/18 1656 04/04/18 2203 04/05/18 0738 04/05/18 1206  GLUCAP 203* 115* 160* 213*   D-Dimer No results for input(s): DDIMER in the last 72 hours. Hgb A1c No results for input(s): HGBA1C in the last 72 hours. Lipid Profile No results for input(s): CHOL, HDL, LDLCALC, TRIG, CHOLHDL, LDLDIRECT in the last 72 hours. Thyroid function studies No results for input(s): TSH, T4TOTAL, T3FREE, THYROIDAB in the last 72 hours.  Invalid input(s): FREET3 Anemia work up No results for input(s): VITAMINB12, FOLATE, FERRITIN, TIBC, IRON, RETICCTPCT in the last 72 hours. Urinalysis No results found for: COLORURINE, APPEARANCEUR, LABSPEC, PHURINE, GLUCOSEU, HGBUR, BILIRUBINUR, KETONESUR, PROTEINUR, UROBILINOGEN, NITRITE, LEUKOCYTESUR Sepsis Labs Invalid input(s): PROCALCITONIN,  WBC,  LACTICIDVEN Microbiology Recent Results (from the past 240 hour(s))  Respiratory Panel by PCR     Status: None   Collection Time: 04/04/18 10:37 AM  Result Value Ref Range Status   Adenovirus NOT DETECTED NOT DETECTED Final   Coronavirus 229E NOT DETECTED NOT DETECTED Final    Comment: (NOTE) The Coronavirus on the Respiratory Panel, DOES NOT test for the novel  Coronavirus (2019 nCoV)    Coronavirus HKU1 NOT DETECTED NOT DETECTED Final   Coronavirus NL63 NOT DETECTED NOT DETECTED Final   Coronavirus OC43 NOT DETECTED NOT DETECTED Final   Metapneumovirus NOT DETECTED NOT DETECTED Final    Rhinovirus / Enterovirus NOT DETECTED NOT DETECTED Final   Influenza A NOT DETECTED NOT DETECTED Final   Influenza B NOT DETECTED NOT DETECTED Final   Parainfluenza Virus 1 NOT DETECTED NOT DETECTED Final   Parainfluenza Virus 2 NOT DETECTED NOT DETECTED Final   Parainfluenza Virus 3 NOT DETECTED NOT DETECTED Final   Parainfluenza Virus 4 NOT DETECTED NOT DETECTED Final   Respiratory Syncytial Virus NOT DETECTED NOT DETECTED Final   Bordetella pertussis NOT DETECTED NOT DETECTED Final   Chlamydophila pneumoniae NOT DETECTED NOT DETECTED Final   Mycoplasma pneumoniae NOT DETECTED NOT DETECTED Final    Comment: Performed at [facility], [address], [place], [place] [address]   Time spent: 30 min  SIGNED:   Rickey Barbara, MD  [facility] 04/05/2018, 2:09 PM  If 7PM-7AM, please contact night-coverage

## 2018-04-08 ENCOUNTER — Other Ambulatory Visit: Payer: Self-pay

## 2018-04-08 ENCOUNTER — Ambulatory Visit (INDEPENDENT_AMBULATORY_CARE_PROVIDER_SITE_OTHER): Payer: Medicare HMO | Admitting: Emergency Medicine

## 2018-04-08 ENCOUNTER — Encounter: Payer: Self-pay | Admitting: Emergency Medicine

## 2018-04-08 ENCOUNTER — Telehealth: Payer: Self-pay

## 2018-04-08 DIAGNOSIS — R0902 Hypoxemia: Secondary | ICD-10-CM

## 2018-04-08 DIAGNOSIS — J449 Chronic obstructive pulmonary disease, unspecified: Secondary | ICD-10-CM | POA: Diagnosis not present

## 2018-04-08 DIAGNOSIS — F17200 Nicotine dependence, unspecified, uncomplicated: Secondary | ICD-10-CM | POA: Diagnosis not present

## 2018-04-08 MED ORDER — HYDROCODONE-HOMATROPINE 5-1.5 MG/5ML PO SYRP: 5.0000 mL | ORAL_SOLUTION | Freq: Four times a day (QID) | ORAL | 0 refills | Status: DC | PRN

## 2018-04-08 MED ORDER — FLUTICASONE-SALMETEROL 250-50 MCG/DOSE IN AEPB: 1.0000 | INHALATION_SPRAY | Freq: Two times a day (BID) | RESPIRATORY_TRACT | 5 refills | Status: DC

## 2018-04-08 NOTE — Patient Instructions (Addendum)
We will change your Advair back from the generic form to the name brand form.  Use twice daily.  Remember to rinse and gargle after using. Please use your Combivent 4 times a day on a schedule. Keep your albuterol and DuoNeb available to use up to every 6 hours if needed for shortness of breath, chest tightness, wheezing. Finish your prednisone prescription as written until completely gone. Use Hycodan cough syrup 5 cc up to every 6 hours if needed for cough suppression.  We will not be able to refill this prescription, its for one-time use. Follow with Dr Delton Coombes in 6 months or sooner if you have any problems

## 2018-04-08 NOTE — Progress Notes (Signed)
  Subjective:    Patient ID: Scott [name], male    DOB: [birth year], 58 y.o.   MRN: [id number]  HPI  ROV 10/05/17 --Scott [name] is a 58 year old smoker with a history of severe obstruction by spirometry, COPD with occasional exacerbations, associated exertional hypoxemia.  Unfortunately back to cigarettes after having stopped for many months, smokes 3 cig a day.  He has been managed with Advair discus, notes that it was recently changed to the generic formulation.  He does not feel that this controls his symptoms as well. He is having more dyspnea, has tried using the generic more than BID. He is having cough, clear sputum. Still on combivent qid. Needs refill omeprazole. He uses DuoNeb qid. He had pneumovax on 09/25/17. No overt flares since last visit. He did not wear his O2 today, is using at night.   ROV 04/08/2018 --follow-up visit for severe COPD and associated exertional hypoxemia.  He also has allergic rhinitis and GERD that contribute to upper airway irritability and cough.  He tells me that he quit smoking in December, is using nicotine gum.  He was hospitalized 57/12-3/13, diagnosed with an acute flare of his COPD.  He was treated with IV corticosteroids, bronchodilators with improvement. No known sick contacts.  He is improving, but still has some cough, feels a bit of chest burning. He is clearing white sputum. Having some nasal congestion and drainage. He is using combivent 4x a day, generic Advair. He does not believe that the generic Advair is effective. He is completing pred taper. He is sleeping with his O2, uses prn for SOB during the day.     No flowsheet data found.  Objective:   Physical Exam  Vitals:   04/08/18 1347  BP: (!) 142/92  Pulse: (!) 115  SpO2: 95%  Weight: 205 lb (93 kg)  Height: 5\' 8"  (1.727 m)   Gen: Pleasant, well-nourished, in no distress  ENT: No lesions,  mouth clear,  oropharynx clear, no postnasal drip,   Neck: No JVD, no stridor  Lungs: mostly clear, no  wheezes  Cardiovascular: RRR, heart sounds normal, no murmur or gallops, no peripheral edema  Musculoskeletal: No deformities, no cyanosis or clubbing  Neuro: alert, appears to be more sedate today, less energy. Non-focal.   Skin: Warm     Assessment & Plan:  COPD (chronic obstructive pulmonary disease) Recent acute exacerbation that resulted in a brief hospitalization.  He had bronchospasm, dyspnea and was treated with corticosteroids with improvement.  He remains on a prednisone taper and we will complete this.  His maintenance regimen is scheduled Combivent and scheduled Advair.  He wants to switch back to the name brand Advair and we will try to arrange for this today.  Continue his DuoNeb or albuterol nebs as needed.  I congratulated him on his smoking cessation.  Hypoxia I discussed with him better oxygen compliance with exertion.  He uses it at night as well.  Tobacco use disorder He stopped at the end of December 2019, congratulated him for this.   Levy Pupa, MD, PhD 04/08/2018, 2:09 PM [facility] [phone] or if no answer [phone]

## 2018-04-08 NOTE — Assessment & Plan Note (Signed)
I discussed with him better oxygen compliance with exertion.  He uses it at night as well.

## 2018-04-08 NOTE — Telephone Encounter (Signed)
Patient called and states he would rather all his medications be sent to [facility] pharmacy. He states he will pick up these medications this one time from [facility] but he wants all his medications to be sent to [facility]. Requests Advair be sent to [facility] and he will pick up his hycodan today. Voiced understanding. Nothing further is needed at this time.

## 2018-04-08 NOTE — Assessment & Plan Note (Signed)
Recent acute exacerbation that resulted in a brief hospitalization.  He had bronchospasm, dyspnea and was treated with corticosteroids with improvement.  He remains on a prednisone taper and we will complete this.  His maintenance regimen is scheduled Combivent and scheduled Advair.  He wants to switch back to the name brand Advair and we will try to arrange for this today.  Continue his DuoNeb or albuterol nebs as needed.  I congratulated him on his smoking cessation.

## 2018-04-08 NOTE — Assessment & Plan Note (Signed)
He stopped at the end of December 2019, congratulated him for this.

## 2018-04-09 ENCOUNTER — Other Ambulatory Visit: Payer: Self-pay | Admitting: Emergency Medicine

## 2018-04-11 ENCOUNTER — Telehealth: Payer: Self-pay | Admitting: Family Medicine

## 2018-04-11 NOTE — Telephone Encounter (Signed)
Copied from CRM [id number]. Topic: Referral - Status >> Apr 11, 2018  1:41 PM Floria Raveling A wrote: Reason for CRM:   [facility] called in and stated that pt is not a candidate for Dr Tollie Eth to manage his pain meds.   She said there were several positive Cocaine test and that would make not eligible to be a pt with them.  She stated they may be willing to do a couple of injection or suboxone but they will not be able to mange meds   Call back number [phone]

## 2018-04-12 ENCOUNTER — Other Ambulatory Visit: Payer: Self-pay

## 2018-04-12 ENCOUNTER — Encounter: Payer: Self-pay | Admitting: Family Medicine

## 2018-04-12 ENCOUNTER — Ambulatory Visit (INDEPENDENT_AMBULATORY_CARE_PROVIDER_SITE_OTHER): Payer: Medicare HMO

## 2018-04-12 ENCOUNTER — Ambulatory Visit (INDEPENDENT_AMBULATORY_CARE_PROVIDER_SITE_OTHER): Payer: Medicare HMO | Admitting: Family Medicine

## 2018-04-12 VITALS — BP 130/88 | HR 135 | Temp 98.3°F | Resp 17 | Ht 68.0 in | Wt 208.0 lb

## 2018-04-12 DIAGNOSIS — J441 Chronic obstructive pulmonary disease with (acute) exacerbation: Secondary | ICD-10-CM | POA: Diagnosis not present

## 2018-04-12 DIAGNOSIS — Z5181 Encounter for therapeutic drug level monitoring: Secondary | ICD-10-CM | POA: Diagnosis not present

## 2018-04-12 DIAGNOSIS — Z79899 Other long term (current) drug therapy: Secondary | ICD-10-CM | POA: Diagnosis not present

## 2018-04-12 DIAGNOSIS — R05 Cough: Secondary | ICD-10-CM | POA: Diagnosis not present

## 2018-04-12 MED ORDER — LEVOFLOXACIN 500 MG PO TABS: 500.0000 mg | ORAL_TABLET | Freq: Every day | ORAL | 0 refills | Status: DC

## 2018-04-12 NOTE — Patient Instructions (Signed)
° ° ° °  If you have lab work done today you will be contacted with your lab results within the next 2 weeks.  If you have not heard from us then please contact us. The fastest way to get your results is to register for My Chart. ° ° °IF you received an x-ray today, you will receive an invoice from [facility]. Please contact [facility] at [phone] with questions or concerns regarding your invoice.  ° °IF you received labwork today, you will receive an invoice from [facility]. Please contact [facility] at [phone] with questions or concerns regarding your invoice.  ° °Our billing staff will not be able to assist you with questions regarding bills from these companies. ° °You will be contacted with the lab results as soon as they are available. The fastest way to get your results is to activate your My Chart account. Instructions are located on the last page of this paperwork. If you have not heard from us regarding the results in 2 weeks, please contact this office. °  ° ° ° °

## 2018-04-12 NOTE — Progress Notes (Signed)
3/20/202010:57 AM  Scott [name] 04-17-1960, 58 y.o., male [id number]  Chief Complaint  Patient presents with   Follow-up    shortness of Breath     HPI:   Patient is a 58 y.o. male with past medical history significant for DM2,HTNHLP, severe COPD and chronic pain who presents today for followup on hosp for COPD exacerbation  Admitted from 3/12-13 for COPD exacerbation Treated with IV steriods and bronchodilators Saw pulm on 3/16 - oxygen use with exertion, trying to get name brand advair, continue combivent qid, f/u 6 months  Breathing not doing well Had to wake up around 4am as he was feeling very breathless, has done to duoneb since then Currently on first day of 20mg  of prednisone taper Coughing with thick yellow sputum, burning chest, no taste No fevers but getting cold sweats Vaporizing THC, not smoking cig, quit dec Last neb tx at home about an hour ago Using his O2 mostly at night, has had to increase to 2.5 L  Fall Risk  04/12/2018 03/29/2018 03/01/2018 12/26/2017 11/26/2017  Falls in the past year? 0 0 0 0 0  Number falls in past yr: - - 0 - -  Injury with Fall? 0 - 0 - -  Comment - - - - -  Follow up - - Falls evaluation completed - -     Depression screen [facility] 2/9 04/12/2018 03/29/2018 03/01/2018  Decreased Interest 0 0 1  Down, Depressed, Hopeless 0 0 1  PHQ - 2 Score 0 0 2  Altered sleeping 0 0 1  Tired, decreased energy 0 0 2  Change in appetite 0 0 0  Feeling bad or failure about yourself  0 0 1  Trouble concentrating 0 0 1  Moving slowly or fidgety/restless 0 0 2  Suicidal thoughts 0 0 0  PHQ-9 Score 0 0 9  Difficult doing work/chores Not difficult at all Not difficult at all Not difficult at all    Allergies  Allergen Reactions   Penicillins Anaphylaxis    Has patient had a PCN reaction causing immediate rash, facial/tongue/throat swelling, SOB or lightheadedness with hypotension: Yes Has patient had a PCN reaction causing severe rash involving  mucus membranes or skin necrosis: Yes Has patient had a PCN reaction that required hospitalization: Yes Has patient had a PCN reaction occurring within the last 10 years: No If all of the above answers are "NO", then may proceed with Cephalosporin use.    Cymbalta [Duloxetine Hcl]     "crazy thoughts"    Prior to Admission medications   Medication Sig Start Date End Date Taking? Authorizing Provider  Acetaminophen-Aspirin Buffered (EXCEDRIN BACK & BODY PO) Take 1 tablet by mouth daily as needed.   Yes [provider]  cyclobenzaprine (FLEXERIL) 10 MG tablet Take 1 tablet (10 mg total) by mouth 3 (three) times daily as needed for muscle spasms. 11/26/17  Yes Myles Lipps, MD  diazepam (VALIUM) 1 MG/ML solution Take 0.5 mLs (0.5 mg total) by mouth 2 (two) times daily for 30 days. 03/29/18 04/28/18 Yes Myles Lipps, MD  Fluticasone-Salmeterol (ADVAIR) 250-50 MCG/DOSE AEPB Inhale 1 puff into the lungs 2 (two) times daily. 04/08/18  Yes Leslye Peer, MD  gabapentin (NEURONTIN) 300 MG capsule TAKE 1 CAPSULE BY MOUTH 3 TIMES DAILY Patient taking differently: Take 300 mg by mouth 3 (three) times daily.  02/13/18  Yes Myles Lipps, MD  hydrochlorothiazide (HYDRODIURIL) 12.5 MG tablet Take 1 tablet (12.5 mg total) by mouth  daily. 10/25/17  Yes Myles Lipps, MD  HYDROcodone-homatropine [facility]) 5-1.5 MG/5ML syrup Take 5 mLs by mouth every 6 (six) hours as needed for cough. 04/08/18  Yes Byrum, Les Pou, MD  Ipratropium-Albuterol (COMBIVENT RESPIMAT) 20-100 MCG/ACT AERS respimat INHALE 1 PUFF BY MOUTH INTO THE LUNGS FOUR TIMES A DAY Patient taking differently: Inhale 1 puff into the lungs every 6 (six) hours as needed.  12/13/17  Yes Leslye Peer, MD  ipratropium-albuterol (DUONEB) 0.5-2.5 (3) MG/3ML SOLN INHALE THE CONTENTS OF 1 VIAL VIA NEBULIZER EVERY 6 HOURS AS DIRECTED FOR SHORTNESS OF BREATH OR WHEEZING 04/10/18  Yes Byrum, Les Pou, MD  nicotine polacrilex (NICORETTE) 2 MG gum  CHEW 1 PIECE BY MOUTH AS NEEDED FOR SMOKING CESSATION Patient taking differently: Take 2 mg by mouth as needed for smoking cessation.  03/13/18  Yes Myles Lipps, MD  omeprazole (PRILOSEC) 40 MG capsule TAKE 1 CAPSULE BY MOUTH EVERY DAY Patient taking differently: Take 40 mg by mouth daily. TAKE 1 CAPSULE BY MOUTH EVERY DAY 10/25/17  Yes Myles Lipps, MD  oxyCODONE-acetaminophen (PERCOCET) 10-325 MG tablet Take 1 tablet by mouth every 4 (four) hours as needed for pain. 03/29/18  Yes Myles Lipps, MD  rosuvastatin (CRESTOR) 20 MG tablet Take 0.5 tablets (10 mg total) by mouth daily. 09/25/17  Yes Myles Lipps, MD  sitaGLIPtin-metformin (JANUMET) 50-500 MG tablet Take 1 tablet by mouth daily. 12/26/17  Yes Myles Lipps, MD  VENTOLIN HFA 108 (90 Base) MCG/ACT inhaler INHALE 1 TO 2 PUFFS BY MOUTH EVERY 6 HOURS AS NEEDED FOR WHEEZING OR SHORTNESS OF BREATH Patient taking differently: Inhale 1-2 puffs into the lungs every 6 (six) hours as needed (wheezing or shortness of breath).  09/07/17  Yes Leslye Peer, MD  Vitamin D, Ergocalciferol, (DRISDOL) 50000 units CAPS capsule Take 1 capsule (50,000 Units total) by mouth every 7 (seven) days. 10/27/17  Yes Myles Lipps, MD    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   COPD (chronic obstructive pulmonary disease) (HCC)    Diabetes mellitus without complication (HCC)    GERD (gastroesophageal reflux disease)    Headache(784.0)    Pneumonia    hx    Past Surgical History:  Procedure Laterality Date   ANTERIOR CERVICAL DECOMP/DISCECTOMY FUSION  12/22/2010   Procedure: ANTERIOR CERVICAL DECOMPRESSION/DISCECTOMY FUSION 3 LEVELS;  Surgeon: Cristi Loron;  Location: [facility];  Service: Neurosurgery;  Laterality: N/A;  Anterior cervical discectomy with fusion cervical three-four, four-five, and five sixCDF with Interbody Prosthesis, plating, and Bone Graft    ANTERIOR  CERVICAL DECOMP/DISCECTOMY FUSION N/A 10/16/2012   Procedure: CERVICAL SIX-SEVEN ANTERIOR CERVICAL DECOMPRESSION/DISCECTOMY FUSION WITH INTERBODY PROTHESIS PLATING BONEGRAFT WITH /POSSIBLE HARDWARE REMOVAL OLD PLATE;  Surgeon: Cristi Loron, MD;  Location: [facility];  Service: Neurosurgery;  Laterality: N/A;   MULTIPLE TOOTH EXTRACTIONS     OTHER SURGICAL HISTORY     surgery on cheekbone and head for fall 2000   OTHER SURGICAL HISTORY     states when about 58yrs old he was urinating blood, 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 and  had surgery for this   SPINE SURGERY     2014 and 2016 - Dr Lovell Sheehan   TONSILLECTOMY      Social History   Tobacco Use   Smoking status: Former Smoker    Packs/day: 0.25  Years: 41.00    Pack years: 10.25    Types: Cigarettes    Last attempt to quit: 01/21/2018    Years since quitting: 0.2   Smokeless tobacco: Never Used  Substance Use Topics   Alcohol use: No    Alcohol/week: 0.0 standard drinks    Family History  Problem Relation Age of Onset   Emphysema Mother    COPD Mother    Asthma Brother    Diabetes Brother    Hyperlipidemia Brother    Hypertension Brother    Asthma Sister    Hyperlipidemia Sister    Hypertension Sister    Mental illness Sister    Heart disease Maternal Uncle    Hypertension Brother     ROS Per hpi  OBJECTIVE:  Today's Vitals   04/12/18 1053  BP: 130/88  Pulse: (!) 135  Resp: 17  Temp: 98.3 F (36.8 C)  TempSrc: Oral  SpO2: 93%  Weight: 208 lb (94.3 kg)  Height: 5\' 8"  (1.727 m)   Body mass index is 31.63 kg/m.   Physical Exam Vitals signs and nursing note reviewed.  Constitutional:      Appearance: He is well-developed.  HENT:     Head: Normocephalic and atraumatic.  Eyes:     Conjunctiva/sclera: Conjunctivae normal.     Pupils: Pupils are equal, round, and reactive to light.  Neck:     Musculoskeletal: Neck supple.  Cardiovascular:     Rate and Rhythm: Regular  rhythm. Tachycardia present.     Heart sounds: No murmur. No friction rub. No gallop.   Pulmonary:     Breath sounds: Decreased air movement present. Rhonchi present. No wheezing or rales.     Comments: Able to speak in full sentences Skin:    General: Skin is warm and dry.  Neurological:     Mental Status: He is alert and oriented to person, place, and time.    Dg Chest 2 View  Result Date: 04/12/2018 CLINICAL DATA:  Productive cough.  COPD. EXAM: CHEST - 2 VIEW COMPARISON:  04/04/2018. FINDINGS: Mediastinum and hilar structures normal. Cardiomegaly with normal pulmonary vascularity. Chronic bilateral interstitial prominence noted. COPD. No focal alveolar infiltrate. No pleural effusion or pneumothorax. Cervicothoracic spine fusion. IMPRESSION: 1. Mild bilateral interstitial prominence, unchanged prior exam. Findings consistent with chronic interstitial lung disease. COPD. No acute abnormality identified. 2.  Stable cardiomegaly.  No pulmonary venous congestion. Electronically Signed   By: Maisie Fus  Register   On: 04/12/2018 11:37     ASSESSMENT and PLAN  1. COPD exacerbation (HCC) Patient with moderate exacerbation that is not resolving with oral pred. PNC allergy, severe COPD, therefore tx with levaquin. Reviewed r/se/b with patient. Cont with inhalers and nebs. Continue with pred taper. Strict ER precautions given. - DG Chest 2 View; Future  2. Encounter for therapeutic drug monitoring - ToxASSURE Select 13 (MW), Urine  Other orders - levofloxacin (LEVAQUIN) 500 MG tablet; Take 1 tablet (500 mg total) by mouth daily.  Return for as scheduled.in 2 weeks    Myles Lipps, MD Primary Care at [facility] [address] [place], [place] [address] Ph.  [phone] Fax [phone]

## 2018-04-16 LAB — TOXASSURE SELECT 13 (MW), URINE

## 2018-04-17 DIAGNOSIS — J449 Chronic obstructive pulmonary disease, unspecified: Secondary | ICD-10-CM | POA: Diagnosis not present

## 2018-04-18 ENCOUNTER — Ambulatory Visit (HOSPITAL_COMMUNITY)
Admission: EM | Admit: 2018-04-18 | Discharge: 2018-04-18 | Disposition: A | Discharge status: Home / Self Care | Payer: Medicare HMO | Attending: Family Medicine | Admitting: Family Medicine

## 2018-04-18 ENCOUNTER — Encounter (HOSPITAL_COMMUNITY): Payer: Self-pay | Admitting: Emergency Medicine

## 2018-04-18 ENCOUNTER — Ambulatory Visit: Payer: Self-pay | Admitting: Family Medicine

## 2018-04-18 ENCOUNTER — Ambulatory Visit: Payer: Self-pay

## 2018-04-18 DIAGNOSIS — B370 Candidal stomatitis: Secondary | ICD-10-CM | POA: Diagnosis not present

## 2018-04-18 DIAGNOSIS — J441 Chronic obstructive pulmonary disease with (acute) exacerbation: Secondary | ICD-10-CM

## 2018-04-18 MED ORDER — FLUCONAZOLE 100 MG PO TABS: 100.0000 mg | ORAL_TABLET | Freq: Every day | ORAL | 0 refills | Status: DC

## 2018-04-18 MED ORDER — PREDNISONE 50 MG PO TABS: ORAL_TABLET | ORAL | 0 refills | Status: DC

## 2018-04-18 NOTE — ED Triage Notes (Signed)
Pt states he was hospitalized for COPD on 3/12 states he went to his lung doctor and his PCP and on 3/20 was given antibiotic, states 5 days ago he started having sores on his tongue.

## 2018-04-18 NOTE — Telephone Encounter (Signed)
Patient called to inform Dr. Leretha Pol he was just seen at [facility] and treated for thrush he is going to his pharmacy to pick up medicine.

## 2018-04-18 NOTE — ED Provider Notes (Signed)
[facility]    CSN: [id number] Arrival date & time: 04/18/18  1547     History   Chief Complaint Chief Complaint  Patient presents with   Mouth Lesions    HPI Lorne Winkels is a 58 y.o. male.   This is a 58 year old man who is here because he believes he has thrush.  Pt states he was hospitalized for COPD on 3/12 states he went to his lung doctor and his PCP and on 3/20 was given antibiotic, states 5 days ago he started having sores on his tongue.   Patient also treated with steroids and he has diabetes.  The tongue is sore and he has burning in his pharynx.    Note from 08/17/2017: Mr. Dery is here requesting a refill of his oxycodone.  He states he takes 1 oxycodone every 4 hours, 6 tablets a day to treat chronic neck and back pain.  He states he has had multiple surgeries. Patient states that he is been under the care of Dr. Willey Blade for years and taking a stable dose of oxycodone 4 to 6 tablets a day during that period of time.  He states that he has been consistent getting his refills, and has not had any trouble with drug screens.  He states that 2 months ago Dr. August Saucer left the practice and a new doctor came in.  That new doctor has now left, and there is not a current doctor managing the practice.  He went today for his usual visit for medicine refills.  He saw a Publishing rights manager.  He states that she told him that she was unable to refill his oxycodone.  She did give him a prescription for diazepam, which she also takes regularly. Patient is here explaining that he needs a refill of his oxycodone until he can find another pain medicine provider. I called to the family nurse practitioner who saw the patient today.  She states she refused his oxycodone because his drug screen was negative for opioids.  She has evidence that he is not taking the medicine properly.  She therefore is not going to give him any more opioid pain medication.  She recommended he follow-up with  his spine provider for management of his neck and back pain.  The family practice office did get a call from [facility] today.  They were surprised to hear another phone call from urgent care center. I explained to the patient that if his primary care doctor is no longer going to give him oxycodone, I did not feel comfortable giving him this medication either.  Patient states that he is already "in withdrawal".  He appears to be comfortable.       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   COPD (chronic obstructive pulmonary disease) (HCC)    Diabetes mellitus without complication (HCC)    GERD (gastroesophageal reflux disease)    Headache(784.0)    Pneumonia    hx    Patient Active Problem List   Diagnosis Date Noted   Tachycardia 03/31/2018   Chronic pain syndrome 08/29/2017   Tobacco use disorder 05/13/2015   Sebaceous cyst 01/08/2014   Hypoxia 06/01/2013   Acute bronchitis 06/01/2013   Diabetes mellitus without complication (HCC)    GERD (gastroesophageal reflux disease)    Cervical post-laminectomy syndrome 10/02/2012   Degeneration of lumbar or lumbosacral intervertebral disc 06/01/2011  COPD (chronic obstructive pulmonary disease) (HCC) 05/30/2011   Hyperlipidemia 05/30/2011   Allergic rhinitis 05/30/2011   Cervical spondylosis with myelopathy 12/22/2010    Past Surgical History:  Procedure Laterality Date   ANTERIOR CERVICAL DECOMP/DISCECTOMY FUSION  12/22/2010   Procedure: ANTERIOR CERVICAL DECOMPRESSION/DISCECTOMY FUSION 3 LEVELS;  Surgeon: Cristi Loron;  Location: [facility];  Service: Neurosurgery;  Laterality: N/A;  Anterior cervical discectomy with fusion cervical three-four, four-five, and five sixCDF with Interbody Prosthesis, plating, and Bone Graft    ANTERIOR CERVICAL DECOMP/DISCECTOMY FUSION N/A 10/16/2012   Procedure: CERVICAL SIX-SEVEN ANTERIOR  CERVICAL DECOMPRESSION/DISCECTOMY FUSION WITH INTERBODY PROTHESIS PLATING BONEGRAFT WITH /POSSIBLE HARDWARE REMOVAL OLD PLATE;  Surgeon: Cristi Loron, MD;  Location: [facility];  Service: Neurosurgery;  Laterality: N/A;   MULTIPLE TOOTH EXTRACTIONS     OTHER SURGICAL HISTORY     surgery on cheekbone and head for fall 2000   OTHER SURGICAL HISTORY     states when about 58yrs old he was urinating blood, 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 Authorizing Provider  Acetaminophen-Aspirin Buffered (EXCEDRIN BACK & BODY PO) Take 1 tablet by mouth daily as needed.    [provider]  cyclobenzaprine (FLEXERIL) 10 MG tablet Take 1 tablet (10 mg total) by mouth 3 (three) times daily as needed for muscle spasms. 11/26/17   Myles Lipps, MD  diazepam (VALIUM) 1 MG/ML solution Take 0.5 mLs (0.5 mg total) by mouth 2 (two) times daily for 30 days. 03/29/18 04/28/18  Myles Lipps, MD  fluconazole (DIFLUCAN) 100 MG tablet Take 1 tablet (100 mg total) by mouth daily. 04/18/18   Elvina Sidle, MD  Fluticasone-Salmeterol (ADVAIR) 250-50 MCG/DOSE AEPB Inhale 1 puff into the lungs 2 (two) times daily. 04/08/18   Leslye Peer, MD  gabapentin (NEURONTIN) 300 MG capsule TAKE 1 CAPSULE BY MOUTH 3 TIMES DAILY Patient taking differently: Take 300 mg by mouth 3 (three) times daily.  02/13/18   Myles Lipps, MD  hydrochlorothiazide (HYDRODIURIL) 12.5 MG tablet Take 1 tablet (12.5 mg total) by mouth daily. 10/25/17   Myles Lipps, MD  HYDROcodone-homatropine [facility]) 5-1.5 MG/5ML syrup Take 5 mLs by mouth every 6 (six) hours as needed for cough. 04/08/18   Leslye Peer, MD  Ipratropium-Albuterol (COMBIVENT RESPIMAT) 20-100 MCG/ACT AERS respimat INHALE 1 PUFF BY MOUTH INTO THE LUNGS FOUR TIMES A  DAY Patient taking differently: Inhale 1 puff into the lungs every 6 (six) hours as needed.  12/13/17   Leslye Peer, MD  ipratropium-albuterol (DUONEB) 0.5-2.5 (3) MG/3ML SOLN INHALE THE CONTENTS OF 1 VIAL VIA NEBULIZER EVERY 6 HOURS AS DIRECTED FOR SHORTNESS OF BREATH OR WHEEZING 04/10/18   Leslye Peer, MD  levofloxacin (LEVAQUIN) 500 MG tablet Take 1 tablet (500 mg total) by mouth daily. 04/12/18   Myles Lipps, MD  nicotine polacrilex (NICORETTE) 2 MG gum CHEW 1 PIECE BY MOUTH AS NEEDED FOR SMOKING CESSATION Patient taking differently: Take 2 mg by mouth as needed for smoking cessation.  03/13/18   Myles Lipps, MD  omeprazole (PRILOSEC) 40 MG capsule TAKE 1 CAPSULE BY MOUTH EVERY DAY Patient taking differently: Take 40 mg by mouth daily. TAKE 1  CAPSULE BY MOUTH EVERY DAY 10/25/17   Myles Lipps, MD  oxyCODONE-acetaminophen (PERCOCET) 10-325 MG tablet Take 1 tablet by mouth every 4 (four) hours as needed for pain. 03/29/18   Myles Lipps, MD  predniSONE (DELTASONE) 50 MG tablet One daily for three days with food 04/18/18   Elvina Sidle, MD  rosuvastatin (CRESTOR) 20 MG tablet Take 0.5 tablets (10 mg total) by mouth daily. 09/25/17   Myles Lipps, MD  sitaGLIPtin-metformin (JANUMET) 50-500 MG tablet Take 1 tablet by mouth daily. 12/26/17   Myles Lipps, MD  VENTOLIN HFA 108 (90 Base) MCG/ACT inhaler INHALE 1 TO 2 PUFFS BY MOUTH EVERY 6 HOURS AS NEEDED FOR WHEEZING OR SHORTNESS OF BREATH Patient taking differently: Inhale 1-2 puffs into the lungs every 6 (six) hours as needed (wheezing or shortness of breath).  09/07/17   Leslye Peer, MD  Vitamin D, Ergocalciferol, (DRISDOL) 50000 units CAPS capsule Take 1 capsule (50,000 Units total) by mouth every 7 (seven) days. 10/27/17   Myles Lipps, MD    Family History Family History  Problem Relation Age of Onset   Emphysema Mother    COPD Mother    Asthma Brother    Diabetes Brother    Hyperlipidemia Brother     Hypertension Brother    Asthma Sister    Hyperlipidemia Sister    Hypertension Sister    Mental illness Sister    Heart disease Maternal Uncle    Hypertension Brother     Social History Social History   Tobacco Use   Smoking status: Former Smoker    Packs/day: 0.25    Years: 41.00    Pack years: 10.25    Types: Cigarettes    Last attempt to quit: 01/21/2018    Years since quitting: 0.2   Smokeless tobacco: Never Used  Substance Use Topics   Alcohol use: No    Alcohol/week: 0.0 standard drinks   Drug use: No     Allergies   Penicillins and Cymbalta [duloxetine hcl]   Review of Systems Review of Systems  HENT: Positive for sore throat.   Respiratory: Positive for cough and wheezing.   All other systems reviewed and are negative.    Physical Exam Triage Vital Signs ED Triage Vitals  Enc Vitals Group     BP 04/18/18 1605 (!) 149/71     Pulse Rate 04/18/18 1605 (!) 128     Resp 04/18/18 1605 18     Temp 04/18/18 1605 99 F (37.2 C)     Temp src --      SpO2 04/18/18 1605 95 %     Weight --      Height --      Head Circumference --      Peak Flow --      Pain Score 04/18/18 1606 8     Pain Loc --      Pain Edu? --      Excl. in GC? --    No data found.  Updated Vital Signs BP (!) 149/71    Pulse (!) 128    Temp 99 F (37.2 C)    Resp 18    SpO2 95%    Physical Exam Vitals signs and nursing note reviewed.  Constitutional:      Appearance: Normal appearance.  HENT:     Right Ear: External ear normal.     Left Ear: External ear normal.     Nose: Nose normal.  Mouth/Throat:     Mouth: Mucous membranes are moist.  Eyes:     Conjunctiva/sclera: Conjunctivae normal.  Neck:     Musculoskeletal: Normal range of motion and neck supple.  Cardiovascular:     Rate and Rhythm: Normal rate and regular rhythm.     Heart sounds: Normal heart sounds.  Pulmonary:     Effort: Pulmonary effort is normal.     Breath sounds: Wheezing present.   Musculoskeletal: Normal range of motion.  Skin:    General: Skin is warm and dry.  Neurological:     General: No focal deficit present.     Mental Status: He is alert and oriented to person, place, and time.  Psychiatric:        Mood and Affect: Mood normal.      UC Treatments / Results  Labs (all labs ordered are listed, but only abnormal results are displayed) Labs Reviewed - No data to display  EKG None  Radiology No results found.  Procedures Procedures (including critical care time)  Medications Ordered in UC Medications - No data to display  Initial Impression / Assessment and Plan / UC Course  I have reviewed the triage vital signs and the nursing notes.  Pertinent labs & imaging results that were available during my care of the patient were reviewed by me and considered in my medical decision making (see chart for details).    Final Clinical Impressions(s) / UC Diagnoses   Final diagnoses:  Thrush  COPD exacerbation [facility])   Discharge Instructions   None    ED Prescriptions    Medication Sig Dispense Auth. Provider   fluconazole (DIFLUCAN) 100 MG tablet Take 1 tablet (100 mg total) by mouth daily. 7 tablet Elvina Sidle, MD   predniSONE (DELTASONE) 50 MG tablet One daily for three days with food 3 tablet Elvina Sidle, MD     Controlled Substance Prescriptions [place] Controlled Substance Registry consulted? Not Applicable   Elvina Sidle, MD 04/18/18 [phone]

## 2018-04-18 NOTE — Telephone Encounter (Signed)
Pt called to report that he has been taking an antibiotic that was given to him last week.  He has developed swelling tongue with blisters and a thick white coating. He states it burns when anything touches it.  He state he has some tightness when he swallows. He has just been to see his pharmacist and he was told to contact his PCP. Per office potocol pt was triages and call transferred to [facility].  Reason for Disposition . White patches that stick to tongue or inner cheek  Answer Assessment - Initial Assessment Questions 1. SYMPTOM: "What's the main symptom you're concerned about?" (e.g., dry mouth. chapped lips, lump)     Blisters on tongue and thick white coat 2. ONSET: "When did the  tongue start?"     Sunday 3. PAIN: "Is there any pain?" If so, ask: "How bad is it?" (Scale: 1-10; mild, moderate, severe)     Mild but burning 4. CAUSE: "What do you think is causing the symptoms?"     antibiotic 5. OTHER SYMPTOMS: "Do you have any other symptoms?" (e.g., fever, sore throat, toothache, swelling)     Throat is tight when swallowing, swelling to tongue 6. PREGNANCY: "Is there any chance you are pregnant?" "When was your last menstrual period?"     NA  Protocols used: MOUTH [facility]

## 2018-04-18 NOTE — Telephone Encounter (Signed)
Spoke with pt and Informed him that I would talk to Dr. Leretha Pol about his symptoms, her verbalized understanding. About 1 hour later pt came into the office but provider was on her tele-med appointments so after she was free I informed her pt was in the office and would like to be seen. After reviewing his symptom provider advise pt that he could be at an urgent care for symptom but pt states he did not want to go to an urgent care at this time. Told provider and she states she will follow up with pt after her tele-med appointments.

## 2018-04-18 NOTE — Telephone Encounter (Signed)
I just got out of the hospital recently.   Then saw Dr. Leretha Pol.  I saw  My lung doctor too.    I've got blisters on my tongue and it's coated with white stuff.  It's very sore.  It's bleeding.   No fever.  My breathing is fine using my breathing treatments.  I will have the office give you a call.  DUe to the coronavirus pandemic they are trying not to have people come into the office.  He was agreeable to this plan.  I sent these notes to Primary Care at [facility] for Dr. Leretha Pol and called them to let them know.   Reason for Disposition . White patches that stick to tongue or inner cheek  Answer Assessment - Initial Assessment Questions 1. ONSET: "When did the mouth start hurting?" (e.g., hours or days ago)      3-4 days ago.  I've been on antibiotics   I was in the hospital recently. 2. SEVERITY: "How bad is the pain?" (Scale 1-10; mild, moderate or severe)   - MILD (1-3):  doesn't interfere with eating or normal activities   - MODERATE (4-7): interferes with eating some solids and normal activities   - SEVERE (8-10):  excruciating pain, interferes with most normal activities   - SEVERE DYSPHAGIA: can't swallow liquids, drooling     My tongue is coated with white stuff.3. SORES: "Are there any sores or ulcers in the mouth?" If so, ask: "What part of the mouth are the sores in?"     I have blisters on my tongue and it's very sore.   4. FEVER: "Do you have a fever?" If so, ask: "What is your temperature, how was it measured, and when did it start?"     No fever. 5. CAUSE: "What do you think is causing the mouth pain?"     I've been in the hospital recently and I took all the antibiotics that Dr Leretha Pol prescribed for me. 6. OTHER SYMPTOMS: "Do you have any other symptoms?" (e.g., difficulty breathing)     No.  Denies headaches or shortness of breath.  Protocols used: MOUTH PAIN-A-AH

## 2018-04-18 NOTE — Telephone Encounter (Addendum)
Please advise 

## 2018-04-24 ENCOUNTER — Other Ambulatory Visit: Payer: Self-pay | Admitting: Family Medicine

## 2018-04-24 ENCOUNTER — Telehealth: Payer: Self-pay | Admitting: Family Medicine

## 2018-04-24 NOTE — Telephone Encounter (Signed)
Please advise 

## 2018-04-24 NOTE — Telephone Encounter (Signed)
Requested medication (s) are due for refill today: yes  Requested medication (s) are on the active medication list: yes  Last refill:  03/29/18  Future visit scheduled: no  Notes to clinic:  Needs UDS This medication not delegated to NT to refill   Requested Prescriptions  Pending Prescriptions Disp Refills   diazePAM 5 MG/5ML SOLN [Pharmacy Med Name: DIAZEPAM 5MG /5ML (1MG /1ML) ORAL SOL] 30 mL     Sig: TAKE 0.5 ML(0.5 MG) BY MOUTH TWICE DAILY     Not Delegated - Psychiatry:  Anxiolytics/Hypnotics Failed - 04/24/2018 12:36 PM      Failed - This refill cannot be delegated      Failed - Urine Drug Screen completed in last 360 days.      Passed - Valid encounter within last 6 months    Recent Outpatient Visits          1 week ago COPD exacerbation [facility])   [facility], Meda Coffee, MD   3 weeks ago Degeneration of lumbar or lumbosacral intervertebral disc   [facility], Meda Coffee, MD   1 month ago Tachycardia   [facility], Meda Coffee, MD   3 months ago Diabetes mellitus without complication [facility])   [facility], Meda Coffee, MD   4 months ago Degeneration of lumbar or lumbosacral intervertebral disc   [facility], Meda Coffee, MD      Future Appointments            In 4 weeks Marykay Lex, MD [facility], [facility]

## 2018-04-24 NOTE — Telephone Encounter (Signed)
04/24/2018 - PATIENT CALLED ABOUT HIS APPOINTMENT WITH DR. Darcel Bayley ON Monday 04/29/2018. HIS VISIT DETAIL WAS IN AS AN OFFICE VISIT SO I CHANGED HIM TO A VIRTUAL TELEMED VISIT. I VERIFIED HIS PHONE NUMBER AND TOLD HIM TO BE BY HIS PHONE AT 10:20am SO THAT HE WOULD NOT MISS DR. Darrell Jewel CALL. HE THEN TOLD ME THAT HE NEEDED A REFILL ON HIS DIAZEPAM AND PERCOCET BECAUSE HE WILL TAKE HIS LAST ONES ON Saturday AND HE NEEDED HIS MEDICINE FOR Sunday. I ASKED HIM IF HE CALLED HIS PHARMACY AND HE SAID THEY TOLD HIM TO CALL HERE. BEST PHONE [phone] (CELL) PHARMACY CHOICE IS [facility] ON [address]. HE SAID TO TAKE THE [facility] ON [address] OFF HIS CHART. MBC

## 2018-04-25 MED ORDER — OXYCODONE-ACETAMINOPHEN 10-325 MG PO TABS: 1.0000 | ORAL_TABLET | Freq: Every day | ORAL | 0 refills | Status: AC

## 2018-04-25 NOTE — Telephone Encounter (Signed)
Patient's concern/request has been addressed already. 

## 2018-04-25 NOTE — Telephone Encounter (Signed)
pmp reviewd, appropriate meds refilled 

## 2018-04-26 ENCOUNTER — Ambulatory Visit: Payer: Self-pay | Admitting: *Deleted

## 2018-04-26 NOTE — Telephone Encounter (Signed)
Pt was calling to check on his refills for percocet and diazepam. Both had been sent to the pharmacy last evening. Pt advised of this. And asked him to check with the pharmacy regarding his refills. Pt voiced understanding. He also wanted to make sure that his appointment for Monday is a telephone visit, because he is unable to use a computer for this visit. Routing to flow at PCP.

## 2018-04-29 ENCOUNTER — Telehealth: Payer: Self-pay | Admitting: Family Medicine

## 2018-04-29 ENCOUNTER — Telehealth (INDEPENDENT_AMBULATORY_CARE_PROVIDER_SITE_OTHER): Payer: Medicare HMO | Admitting: Family Medicine

## 2018-04-29 DIAGNOSIS — B370 Candidal stomatitis: Secondary | ICD-10-CM

## 2018-04-29 DIAGNOSIS — M5137 Other intervertebral disc degeneration, lumbosacral region: Secondary | ICD-10-CM

## 2018-04-29 DIAGNOSIS — Z5181 Encounter for therapeutic drug level monitoring: Secondary | ICD-10-CM | POA: Diagnosis not present

## 2018-04-29 DIAGNOSIS — Z79891 Long term (current) use of opiate analgesic: Secondary | ICD-10-CM

## 2018-04-29 MED ORDER — NYSTATIN 100000 UNIT/ML MT SUSP: 5.0000 mL | Freq: Four times a day (QID) | OROMUCOSAL | 1 refills | Status: DC

## 2018-04-29 NOTE — Progress Notes (Signed)
Tongue feels much better, no more swollen. Says swollen and was yellow in color, no back to original color. Looks like it back to normal size. He was given an antibiotic and steroids at the urgent care. Has a cold and cough now. Asking for meds for cough, cold and hot sweats. Denies fever. Does not think these are signs of the virus being that he already has trouble breathing with COPD. Needs medication sent to the [facility] on [facility]

## 2018-04-29 NOTE — Progress Notes (Signed)
Virtual Visit via telephone Note  I connected with patient on 04/29/18 at 1057 by telephone and verified that I am speaking with the correct person using two identifiers. Scott [name] is currently located at home and patient is currently with her during visit. The provider, Myles Lipps, MD is located in their office at time of visit.  I discussed the limitations, risks, security and privacy concerns of performing an evaluation and management service by telephone and the availability of in person appointments. I also discussed with the patient that there may be a patient responsible charge related to this service. The patient expressed understanding and agreed to proceed.   Telephone visit today for followup after seeing urgent care for thrush  HPI Still having some color changes, not swollen Still having very dried mouth Treated with fluconazole x 7 days Better but not resolved  ?  Fall Risk  04/29/2018 04/12/2018 03/29/2018 03/01/2018 12/26/2017  Falls in the past year? 0 0 0 0 0  Number falls in past yr: 0 - - 0 -  Injury with Fall? 0 0 - 0 -  Comment - - - - -  Follow up - - - Falls evaluation completed -     Depression screen [facility] 2/9 04/29/2018 04/12/2018 03/29/2018  Decreased Interest 0 0 0  Down, Depressed, Hopeless 0 0 0  PHQ - 2 Score 0 0 0  Altered sleeping - 0 0  Tired, decreased energy - 0 0  Change in appetite - 0 0  Feeling bad or failure about yourself  - 0 0  Trouble concentrating - 0 0  Moving slowly or fidgety/restless - 0 0  Suicidal thoughts - 0 0  PHQ-9 Score - 0 0  Difficult doing work/chores - Not difficult at all Not difficult at all    Allergies  Allergen Reactions  . Penicillins Anaphylaxis    Has patient had a PCN reaction causing immediate rash, facial/tongue/throat swelling, SOB or lightheadedness with hypotension: Yes Has patient had a PCN reaction causing severe rash involving mucus membranes or skin necrosis: Yes Has patient had a PCN reaction  that required hospitalization: Yes Has patient had a PCN reaction occurring within the last 10 years: No If all of the above answers are "NO", then may proceed with Cephalosporin use.   Marland Kitchen Cymbalta [Duloxetine Hcl]     "crazy thoughts"    Prior to Admission medications   Medication Sig Start Date End Date Taking? Authorizing Provider  Acetaminophen-Aspirin Buffered (EXCEDRIN BACK & BODY PO) Take 1 tablet by mouth daily as needed.    [provider]  cyclobenzaprine (FLEXERIL) 10 MG tablet Take 1 tablet (10 mg total) by mouth 3 (three) times daily as needed for muscle spasms. 11/26/17   Myles Lipps, MD  diazePAM 5 MG/5ML SOLN Take 0.4 mLs (0.4 mg total) by mouth 2 (two) times daily for 30 days. 04/25/18 05/25/18  Myles Lipps, MD  fluconazole (DIFLUCAN) 100 MG tablet Take 1 tablet (100 mg total) by mouth daily. 04/18/18   Elvina Sidle, MD  Fluticasone-Salmeterol (ADVAIR) 250-50 MCG/DOSE AEPB Inhale 1 puff into the lungs 2 (two) times daily. 04/08/18   Leslye Peer, MD  gabapentin (NEURONTIN) 300 MG capsule TAKE 1 CAPSULE BY MOUTH 3 TIMES DAILY Patient taking differently: Take 300 mg by mouth 3 (three) times daily.  02/13/18   Myles Lipps, MD  hydrochlorothiazide (HYDRODIURIL) 12.5 MG tablet Take 1 tablet (12.5 mg total) by mouth daily. 10/25/17  Myles Lipps, MD  Ipratropium-Albuterol (COMBIVENT RESPIMAT) 20-100 MCG/ACT AERS respimat INHALE 1 PUFF BY MOUTH INTO THE LUNGS FOUR TIMES A DAY Patient taking differently: Inhale 1 puff into the lungs every 6 (six) hours as needed.  12/13/17   Leslye Peer, MD  ipratropium-albuterol (DUONEB) 0.5-2.5 (3) MG/3ML SOLN INHALE THE CONTENTS OF 1 VIAL VIA NEBULIZER EVERY 6 HOURS AS DIRECTED FOR SHORTNESS OF BREATH OR WHEEZING 04/10/18   Leslye Peer, MD  nicotine polacrilex (NICORETTE) 2 MG gum CHEW 1 PIECE BY MOUTH AS NEEDED FOR SMOKING CESSATION Patient taking differently: Take 2 mg by mouth as needed for smoking cessation.   03/13/18   Myles Lipps, MD  omeprazole (PRILOSEC) 40 MG capsule TAKE 1 CAPSULE BY MOUTH EVERY DAY Patient taking differently: Take 40 mg by mouth daily. TAKE 1 CAPSULE BY MOUTH EVERY DAY 10/25/17   Myles Lipps, MD  oxyCODONE-acetaminophen (PERCOCET) 10-325 MG tablet Take 1 tablet by mouth 5 (five) times daily for 30 days. 04/25/18 05/25/18  Myles Lipps, MD  predniSONE (DELTASONE) 50 MG tablet One daily for three days with food 04/18/18   Elvina Sidle, MD  rosuvastatin (CRESTOR) 20 MG tablet Take 0.5 tablets (10 mg total) by mouth daily. 09/25/17   Myles Lipps, MD  sitaGLIPtin-metformin (JANUMET) 50-500 MG tablet Take 1 tablet by mouth daily. 12/26/17   Myles Lipps, MD  VENTOLIN HFA 108 (90 Base) MCG/ACT inhaler INHALE 1 TO 2 PUFFS BY MOUTH EVERY 6 HOURS AS NEEDED FOR WHEEZING OR SHORTNESS OF BREATH Patient taking differently: Inhale 1-2 puffs into the lungs every 6 (six) hours as needed (wheezing or shortness of breath).  09/07/17   Leslye Peer, MD  Vitamin D, Ergocalciferol, (DRISDOL) 50000 units CAPS capsule Take 1 capsule (50,000 Units total) by mouth every 7 (seven) days. 10/27/17   Myles Lipps, MD    Past Medical History:  Diagnosis Date  . 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 COPD (chronic obstructive pulmonary disease) (HCC)   . Diabetes mellitus without complication (HCC)   . GERD (gastroesophageal reflux disease)   . Headache(784.0)   . Pneumonia    hx    Past Surgical History:  Procedure Laterality Date  . ANTERIOR CERVICAL DECOMP/DISCECTOMY FUSION  12/22/2010   Procedure: ANTERIOR CERVICAL DECOMPRESSION/DISCECTOMY FUSION 3 LEVELS;  Surgeon: Cristi Loron;  Location: [facility];  Service: Neurosurgery;  Laterality: N/A;  Anterior cervical discectomy with fusion cervical three-four, four-five, and five sixCDF with Interbody Prosthesis, plating, and Bone Graft   . ANTERIOR CERVICAL DECOMP/DISCECTOMY FUSION N/A 10/16/2012    Procedure: CERVICAL SIX-SEVEN ANTERIOR CERVICAL DECOMPRESSION/DISCECTOMY FUSION WITH INTERBODY PROTHESIS PLATING BONEGRAFT WITH /POSSIBLE HARDWARE REMOVAL OLD PLATE;  Surgeon: Cristi Loron, MD;  Location: [facility];  Service: Neurosurgery;  Laterality: N/A;  . MULTIPLE TOOTH EXTRACTIONS    . OTHER SURGICAL HISTORY     surgery on cheekbone and head for fall 2000  . 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 SPINE SURGERY     2014 and 2016 - Dr Lovell Sheehan  . TONSILLECTOMY      Social History   Tobacco Use  . Smoking status: Former Smoker    Packs/day: 0.25    Years: 41.00    Pack years: 10.25    Types:  Cigarettes    Last attempt to quit: 01/21/2018    Years since quitting: 0.2  . Smokeless tobacco: Never Used  Substance Use Topics  . Alcohol use: No    Alcohol/week: 0.0 standard drinks    Family History  Problem Relation Age of Onset  . Emphysema Mother   . COPD Mother   . Asthma Brother   . Diabetes Brother   . Hyperlipidemia Brother   . Hypertension Brother   . Asthma Sister   . Hyperlipidemia Sister   . Hypertension Sister   . Mental illness Sister   . Heart disease Maternal Uncle   . Hypertension Brother     ROS Per hpi  Objective  Vitals as reported by the patient: none  There were no vitals filed for this visit.  ASSESSMENT and PLAN  1. Oral candidiasis Improved but not resolved. rx for nystatin swish and swallow. Reviewed importance of rinsing after inhaler use  2. Degeneration of lumbar or lumbosacral intervertebral disc Almost completely done with bzd wean. Referring to pain mgt as discussed with patient when we first meet, intention was not to be permament pian mgt provider. - Ambulatory referral to Pain Clinic  3. Encounter for therapeutic drug monitoring - Ambulatory referral to Pain Clinic  4. Long term prescription opiate use - Ambulatory  referral to Pain Clinic  Other orders - nystatin (MYCOSTATIN) 100000 UNIT/ML suspension; Take 5 mLs (500,000 Units total) by mouth 4 (four) times daily. Swish and swallow  FOLLOW-UP: 4 weeks.    The above assessment and management plan was discussed with the patient. The patient verbalized understanding of and has agreed to the management plan. Patient is aware to call the clinic if symptoms persist or worsen. Patient is aware when to return to the clinic for a follow-up visit. Patient educated on when it is appropriate to go to the emergency department.    I provided 13 minutes of non-face-to-face time during this encounter.  Myles Lipps, MD Primary Care at [facility] [address] [place], [place] [address] Ph.  [phone] Fax [phone]

## 2018-04-29 NOTE — Telephone Encounter (Signed)
Pt. Calling to verify appointment.

## 2018-05-02 ENCOUNTER — Other Ambulatory Visit: Payer: Self-pay | Admitting: Emergency Medicine

## 2018-05-07 ENCOUNTER — Other Ambulatory Visit: Payer: Self-pay | Admitting: Emergency Medicine

## 2018-05-07 ENCOUNTER — Other Ambulatory Visit: Payer: Self-pay | Admitting: Family Medicine

## 2018-05-15 ENCOUNTER — Encounter (HOSPITAL_COMMUNITY): Payer: Self-pay | Admitting: Emergency Medicine

## 2018-05-15 ENCOUNTER — Emergency Department (HOSPITAL_COMMUNITY)
Admission: EM | Admit: 2018-05-15 | Discharge: 2018-05-15 | Disposition: A | Discharge status: Home / Self Care | Payer: Medicare HMO | Attending: Emergency Medicine | Admitting: Emergency Medicine

## 2018-05-15 ENCOUNTER — Other Ambulatory Visit: Payer: Self-pay

## 2018-05-15 DIAGNOSIS — T50991A Poisoning by other drugs, medicaments and biological substances, accidental (unintentional), initial encounter: Secondary | ICD-10-CM | POA: Diagnosis not present

## 2018-05-15 DIAGNOSIS — T50904A Poisoning by unspecified drugs, medicaments and biological substances, undetermined, initial encounter: Secondary | ICD-10-CM | POA: Diagnosis not present

## 2018-05-15 DIAGNOSIS — T50901A Poisoning by unspecified drugs, medicaments and biological substances, accidental (unintentional), initial encounter: Secondary | ICD-10-CM

## 2018-05-15 DIAGNOSIS — J449 Chronic obstructive pulmonary disease, unspecified: Secondary | ICD-10-CM | POA: Insufficient documentation

## 2018-05-15 DIAGNOSIS — Z79899 Other long term (current) drug therapy: Secondary | ICD-10-CM | POA: Diagnosis not present

## 2018-05-15 DIAGNOSIS — R0689 Other abnormalities of breathing: Secondary | ICD-10-CM | POA: Diagnosis not present

## 2018-05-15 DIAGNOSIS — R000 Tachycardia, unspecified: Secondary | ICD-10-CM | POA: Diagnosis not present

## 2018-05-15 DIAGNOSIS — R4020 Unspecified coma: Secondary | ICD-10-CM | POA: Diagnosis not present

## 2018-05-15 DIAGNOSIS — E119 Type 2 diabetes mellitus without complications: Secondary | ICD-10-CM | POA: Insufficient documentation

## 2018-05-15 DIAGNOSIS — R404 Transient alteration of awareness: Secondary | ICD-10-CM | POA: Diagnosis not present

## 2018-05-15 DIAGNOSIS — Z87891 Personal history of nicotine dependence: Secondary | ICD-10-CM | POA: Insufficient documentation

## 2018-05-15 NOTE — ED Triage Notes (Signed)
Patient transported via [facility] from a parking lot and reports the following:  --Bystander found him unresponsive in his car. Unresponsive with slowing respirations when EMS arrived. EMS administered Narcan 2 mg intra nasal and 0.5 IV. [place] [employer] had nasal trumpet and non-rebreather applied when EMS arrived.  --Pt rx percot and oxycodone, could have taken an extra one. Pt reports marijuana and ETOH use today.  --BP 170/100 --O2 94% --BG 160  20 g left hand

## 2018-05-15 NOTE — Discharge Instructions (Addendum)
Please return for any problem.   Follow up with your regular care provider as instructed.   Do not drink alcohol and take narcotics at the same time.

## 2018-05-15 NOTE — ED Provider Notes (Signed)
[facility] DEPT Provider Note   CSN: [id number] Arrival date & time: 05/15/18  1958    History   Chief Complaint Chief Complaint  Patient presents with  . Drug Overdose    HPI Scott [name] is a 58 y.o. male.     58 year old male with prior medical history as detailed below presents for evaluation of suspected overdose.  Patient reports that earlier today he had several alcoholic drinks.  He took his regular dose of Percocet.  He then drove to an ATM.  Shortly thereafter he was found unresponsive behind the wheel of his vehicle.  He cannot recall exactly how he ended up unresponsive.  EMS reports that he was given a nasal trumpet.  Intranasal and then IV Narcan caused a significant improvement in his mental status.  Upon arrival to the ED he is now without complaint.  He denies associated chest pain, shortness of breath, nausea, vomiting, fever, or other acute complaint.  The history is provided by the patient and medical records.  Drug Overdose  This is a new problem. The current episode started less than 1 hour ago. The problem occurs constantly. The problem has been resolved. Nothing aggravates the symptoms. Nothing relieves the symptoms.    Past Medical History:  Diagnosis Date  . 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 COPD (chronic obstructive pulmonary disease) ([place])   . Diabetes mellitus without complication ([place])   . GERD (gastroesophageal reflux disease)   . Headache(784.0)   . Pneumonia    hx    Patient Active Problem List   Diagnosis Date Noted  . Tachycardia 03/31/2018  . Chronic pain syndrome 08/29/2017  . Tobacco use disorder 05/13/2015  . Sebaceous cyst 01/08/2014  . Hypoxia 06/01/2013  . Acute bronchitis 06/01/2013  . Diabetes mellitus without complication ([place])   . GERD (gastroesophageal reflux disease)   . Cervical post-laminectomy syndrome 10/02/2012  . Degeneration of lumbar or lumbosacral  intervertebral disc 06/01/2011  . COPD (chronic obstructive pulmonary disease) ([place]) 05/30/2011  . Hyperlipidemia 05/30/2011  . Allergic rhinitis 05/30/2011  . Cervical spondylosis with myelopathy 12/22/2010    Past Surgical History:  Procedure Laterality Date  . ANTERIOR CERVICAL DECOMP/DISCECTOMY FUSION  12/22/2010   Procedure: ANTERIOR CERVICAL DECOMPRESSION/DISCECTOMY FUSION 3 LEVELS;  Surgeon: Ophelia Charter;  Location: [facility];  Service: Neurosurgery;  Laterality: N/A;  Anterior cervical discectomy with fusion cervical three-four, four-five, and five sixCDF with Interbody Prosthesis, plating, and Bone Graft   . ANTERIOR CERVICAL DECOMP/DISCECTOMY FUSION N/A 10/16/2012   Procedure: CERVICAL SIX-SEVEN ANTERIOR CERVICAL DECOMPRESSION/DISCECTOMY FUSION WITH INTERBODY PROTHESIS PLATING BONEGRAFT WITH /POSSIBLE HARDWARE REMOVAL OLD PLATE;  Surgeon: Ophelia Charter, MD;  Location: [facility];  Service: Neurosurgery;  Laterality: N/A;  . MULTIPLE TOOTH EXTRACTIONS    . OTHER SURGICAL HISTORY     surgery on cheekbone and head for fall 2000  . 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 [facility]     2014 and 2016 - Dr JArnoldo Morale . TONSILLECTOMY          Home Medications    Prior to Admission medications   Medication Sig Start Date End Date Taking? Authorizing Provider  Acetaminophen-Aspirin Buffered (EXCEDRIN BACK & BODY PO) Take 1 tablet by mouth daily as needed.    [provider]  albuterol (VENTOLIN HFA) 108 (90 Base) MCG/ACT inhaler Inhale 1-2 puffs into the lungs every 6 (six) hours as needed (wheezing or shortness of breath). 05/02/18   Collene Gobble, MD  COMBIVENT RESPIMAT 20-100 MCG/ACT AERS respimat INHALE 1 PUFF BY MOUTH INTO THE LUNGS FOUR TIMES A DAY 05/08/18   Byrum, Rose Fillers, MD  cyclobenzaprine (FLEXERIL) 10 MG tablet TAKE 1 TABLET BY MOUTH 3 TIMES DAILY  AS NEEDED FOR MUSCLE SPASMS 05/09/18   Rutherford Guys, MD  diazePAM 5 MG/5ML SOLN Take 0.4 mLs (0.4 mg total) by mouth 2 (two) times daily for 30 days. 04/25/18 05/25/18  Rutherford Guys, MD  fluconazole (DIFLUCAN) 100 MG tablet Take 1 tablet (100 mg total) by mouth daily. 04/18/18   Robyn Haber, MD  Fluticasone-Salmeterol (ADVAIR) 250-50 MCG/DOSE AEPB Inhale 1 puff into the lungs 2 (two) times daily. 04/08/18   Collene Gobble, MD  gabapentin (NEURONTIN) 300 MG capsule TAKE 1 CAPSULE BY MOUTH 3 TIMES DAILY 05/09/18   Rutherford Guys, MD  hydrochlorothiazide (HYDRODIURIL) 12.5 MG tablet Take 1 tablet (12.5 mg total) by mouth daily. 10/25/17   Rutherford Guys, MD  ipratropium-albuterol (DUONEB) 0.5-2.5 (3) MG/3ML SOLN INHALE THE CONTENTS OF 1 VIAL VIA NEBULIZER EVERY 6 HOURS AS DIRECTED FOR SHORTNESS OF BREATH OR WHEEZING 04/10/18   Byrum, Rose Fillers, MD  JANUMET 50-500 MG tablet TAKE 1 TABLET BY MOUTH ONCE A DAY 05/09/18   Rutherford Guys, MD  nicotine polacrilex (NICORETTE) 2 MG gum CHEW 1 PIECE BY MOUTH AS NEEDED FOR SMOKING CESSATION Patient taking differently: Take 2 mg by mouth as needed for smoking cessation.  03/13/18   Rutherford Guys, MD  nystatin (MYCOSTATIN) 100000 UNIT/ML suspension Take 5 mLs (500,000 Units total) by mouth 4 (four) times daily. Swish and swallow 04/29/18   Rutherford Guys, MD  omeprazole (PRILOSEC) 40 MG capsule TAKE 1 CAPSULE BY MOUTH EVERY DAY Patient taking differently: Take 40 mg by mouth daily. TAKE 1 CAPSULE BY MOUTH EVERY DAY 10/25/17   Rutherford Guys, MD  oxyCODONE-acetaminophen (PERCOCET) 10-325 MG tablet Take 1 tablet by mouth 5 (five) times daily for 30 days. 04/25/18 05/25/18  Rutherford Guys, MD  predniSONE (DELTASONE) 50 MG tablet One daily for three days with food 04/18/18   Robyn Haber, MD  rosuvastatin (CRESTOR) 20 MG tablet Take 0.5 tablets (10 mg total) by mouth daily. 09/25/17   Rutherford Guys, MD  Vitamin D, Ergocalciferol, (DRISDOL) 50000 units CAPS  capsule Take 1 capsule (50,000 Units total) by mouth every 7 (seven) days. 10/27/17   Rutherford Guys, MD    Family History Family History  Problem Relation Age of Onset  . Emphysema Mother   . COPD Mother   . Asthma Brother   . Diabetes Brother   . Hyperlipidemia Brother   . Hypertension Brother   . Asthma Sister   . Hyperlipidemia Sister   . Hypertension Sister   . Mental illness Sister   . Heart disease Maternal Uncle   . Hypertension Brother     Social History Social History   Tobacco Use  . Smoking status: Former Smoker    Packs/day: 0.25    Years: 41.00    Pack years: 10.25    Types: Cigarettes    Last attempt to quit: 01/21/2018    Years since quitting: 0.3  . Smokeless tobacco: Never Used  Substance Use Topics  . Alcohol use: Yes    Comment:  Beer occasionally  . Drug use: No     Allergies   Penicillins and Cymbalta [duloxetine hcl]   Review of Systems Review of Systems  All other systems reviewed and are negative.    Physical Exam Updated Vital Signs BP 135/89 (BP Location: Left Arm)   Pulse (!) 109   Temp 98 F (36.7 C) (Oral)   Resp 18   Ht '5\' 8"'  (1.727 m)   Wt 100.2 kg   SpO2 97%   BMI 33.60 kg/m   Physical Exam Vitals signs and nursing note reviewed.  Constitutional:      General: He is not in acute distress.    Appearance: He is well-developed.  HENT:     Head: Normocephalic and atraumatic.  Eyes:     Conjunctiva/sclera: Conjunctivae normal.     Pupils: Pupils are equal, round, and reactive to light.  Neck:     Musculoskeletal: Normal range of motion and neck supple.  Cardiovascular:     Rate and Rhythm: Normal rate and regular rhythm.     Heart sounds: Normal heart sounds.  Pulmonary:     Effort: Pulmonary effort is normal. No respiratory distress.     Breath sounds: Normal breath sounds.  Abdominal:     General: There is no distension.     Palpations: Abdomen is soft.     Tenderness: There is no abdominal tenderness.   Musculoskeletal: Normal range of motion.        General: No deformity.  Skin:    General: Skin is warm and dry.  Neurological:     Mental Status: He is alert and oriented to person, place, and time.      ED Treatments / Results  Labs (all labs ordered are listed, but only abnormal results are displayed) Labs Reviewed - No data to display  EKG EKG Interpretation  Date/Time:  Wednesday May 15 2018 20:07:23 EDT Ventricular Rate:  112 PR Interval:    QRS Duration: 113 QT Interval:  345 QTC Calculation: 471 R Axis:   -22 Text Interpretation:  Sinus tachycardia Probable left atrial enlargement Borderline intraventricular conduction delay Borderline T wave abnormalities Confirmed by Dene Gentry [phone]) on 05/15/2018 8:12:34 PM   Radiology No results found.  Procedures Procedures (including critical care time)  Medications Ordered in ED Medications - No data to display   Initial Impression / Assessment and Plan / ED Course  I have reviewed the triage vital signs and the nursing notes.  Pertinent labs & imaging results that were available during my care of the patient were reviewed by me and considered in my medical decision making (see chart for details).        MDM  Screen complete  Scott [name] was evaluated in Emergency Department on 05/15/2018 for the symptoms described in the history of present illness. He was evaluated in the context of the global COVID-19 pandemic, which necessitated consideration that the patient might be at risk for infection with the SARS-CoV-2 virus that causes COVID-19. Institutional protocols and algorithms that pertain to the evaluation of patients at risk for COVID-19 are in a state of rapid change based on information released by regulatory bodies including the CDC and federal and state organizations. These policies and algorithms were followed during the patient's care in the ED.  Patient is presenting for evaluation following  reported accidental overdose.  Patient appears to have combined alcohol with his routine narcotics.  He did require Narcan in the field.  However on arrival to  the ED he is alert and oriented.  He is without complaint.  He was observed in the ED and did not require additional doses of Narcan.  After his evaluation in the ED he now desires discharge home.  He does understand the importance of not drinking alcohol and taking narcotics at the same time.  He is also advised to not drive while under the influence of alcohol or narcotics.  Strict return precautions given and understood.  Importance of close follow-up is stressed.    Final Clinical Impressions(s) / ED Diagnoses   Final diagnoses:  Accidental drug overdose, initial encounter    ED Discharge Orders    None       Valarie Merino, MD 05/15/18 2139

## 2018-05-15 NOTE — ED Notes (Signed)
Bed: WA20 Expected date:  Expected time:  Means of arrival:  Comments: 58 yr old responded to [place]

## 2018-05-17 ENCOUNTER — Ambulatory Visit: Payer: Medicare HMO | Admitting: Cardiovascular Disease

## 2018-05-18 DIAGNOSIS — J449 Chronic obstructive pulmonary disease, unspecified: Secondary | ICD-10-CM | POA: Diagnosis not present

## 2018-05-22 ENCOUNTER — Ambulatory Visit: Payer: Medicare HMO | Admitting: Cardiology

## 2018-05-22 ENCOUNTER — Telehealth: Payer: Self-pay | Admitting: Cardiology

## 2018-05-23 NOTE — Progress Notes (Signed)
Virtual Visit via Telephone Note   This visit type was conducted due to national recommendations for restrictions regarding the COVID-19 Pandemic (e.g. social distancing) in an effort to limit this patient's exposure and mitigate transmission in our community.  Due to his co-morbid illnesses, this patient is at least at moderate risk for complications without adequate follow up.  This format is felt to be most appropriate for this patient at this time.  The patient did not have access to video technology/had technical difficulties with video requiring transitioning to audio format only (telephone).  All issues noted in this document were discussed and addressed.  No physical exam could be performed with this format.  Please refer to the patient's chart for his  consent to telehealth for [facility].   Date:  05/24/2018   ID:  Scott [name], DOB 1960-04-02, MRN [id number]  Patient Location: Home Provider Location: Home  PCP:  Myles Lipps, MD  Cardiologist:  Rollene Rotunda, MD  Electrophysiologist:  None   Evaluation Performed:  New Patient Evaluation  Chief Complaint:  Tachycardia  History of Present Illness:    Scott [name] is a 58 y.o. male who is referred by Myles Lipps, MD for evaluation of tachycardia.  He has no past cardiac history.  He was in the hospital recently with AMS.  He was thought to have an accidental drug overdose using pain meds with alcohol.  He sounds like he is limited by back pain.  He is going to see a new pain doctor soon.  He does not do a lot of activities.  He does not usually have presyncope or syncope.  He said that he has had tachycardia.  I actually reviewed an EKG from 4 22 when he had sinus tachycardia.  I see lots of vital signs and he has a rapid heart rate.  I was able to review labs and has had normal TSH this year and is not anemic.  He has not described chest pressure, neck or arm discomfort.  He has chronic COPD and he sleeps with  oxygen.  He thinks he has been slightly more short of breath slowly over time.  He is not describing PND or orthopnea however.  The patient does not have symptoms concerning for COVID-19 infection (fever, chills, cough, or new shortness of breath).    Past Medical History:  Diagnosis Date  . 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 COPD (chronic obstructive pulmonary disease) ([facility])   . Diabetes mellitus without complication ([facility])   . GERD (gastroesophageal reflux disease)   . Headache(784.0)   . Pneumonia    hx   Past Surgical History:  Procedure Laterality Date  . ANTERIOR CERVICAL DECOMP/DISCECTOMY FUSION  12/22/2010   Procedure: ANTERIOR CERVICAL DECOMPRESSION/DISCECTOMY FUSION 3 LEVELS;  Surgeon: Cristi Loron;  Location: [facility];  Service: Neurosurgery;  Laterality: N/A;  Anterior cervical discectomy with fusion cervical three-four, four-five, and five sixCDF with Interbody Prosthesis, plating, and Bone Graft   . ANTERIOR CERVICAL DECOMP/DISCECTOMY FUSION N/A 10/16/2012   Procedure: CERVICAL SIX-SEVEN ANTERIOR CERVICAL DECOMPRESSION/DISCECTOMY FUSION WITH INTERBODY PROTHESIS PLATING BONEGRAFT WITH /POSSIBLE HARDWARE REMOVAL OLD PLATE;  Surgeon: Cristi Loron, MD;  Location: [facility];  Service: Neurosurgery;  Laterality: N/A;  . MULTIPLE TOOTH EXTRACTIONS    . OTHER SURGICAL HISTORY     surgery on cheekbone and head for fall 2000  . 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 SPINE SURGERY     2014 and 2016 - Dr Lovell SheehanJenkins  . TONSILLECTOMY       Current Meds  Medication Sig  . Acetaminophen-Aspirin Buffered (EXCEDRIN BACK & BODY PO) Take 1 tablet by mouth daily as needed.  Marland Kitchen. albuterol (VENTOLIN HFA) 108 (90 Base) MCG/ACT inhaler Inhale 1-2 puffs into the lungs every 6 (six) hours as needed (wheezing or shortness of breath).  . COMBIVENT RESPIMAT  20-100 MCG/ACT AERS respimat INHALE 1 PUFF BY MOUTH INTO THE LUNGS FOUR TIMES A DAY  . cyclobenzaprine (FLEXERIL) 10 MG tablet TAKE 1 TABLET BY MOUTH 3 TIMES DAILY AS NEEDED FOR MUSCLE SPASMS  . diazePAM 5 MG/5ML SOLN Take 0.4 mLs (0.4 mg total) by mouth 2 (two) times daily for 30 days.  . Fluticasone-Salmeterol (ADVAIR) 250-50 MCG/DOSE AEPB Inhale 1 puff into the lungs 2 (two) times daily.  Marland Kitchen. gabapentin (NEURONTIN) 300 MG capsule TAKE 1 CAPSULE BY MOUTH 3 TIMES DAILY  . hydrochlorothiazide (HYDRODIURIL) 12.5 MG tablet Take 1 tablet (12.5 mg total) by mouth daily.  Marland Kitchen. ipratropium-albuterol (DUONEB) 0.5-2.5 (3) MG/3ML SOLN INHALE THE CONTENTS OF 1 VIAL VIA NEBULIZER EVERY 6 HOURS AS DIRECTED FOR SHORTNESS OF BREATH OR WHEEZING  . nicotine polacrilex (NICORETTE) 2 MG gum CHEW 1 PIECE BY MOUTH AS NEEDED FOR SMOKING CESSATION (Patient taking differently: Take 2 mg by mouth as needed for smoking cessation. )  . nystatin (MYCOSTATIN) 100000 UNIT/ML suspension Take 5 mLs (500,000 Units total) by mouth 4 (four) times daily. Swish and swallow  . omeprazole (PRILOSEC) 40 MG capsule TAKE 1 CAPSULE BY MOUTH EVERY DAY (Patient taking differently: Take 40 mg by mouth daily. TAKE 1 CAPSULE BY MOUTH EVERY DAY)  . oxyCODONE-acetaminophen (PERCOCET) 10-325 MG tablet Take 1 tablet by mouth 5 (five) times daily for 30 days.  . rosuvastatin (CRESTOR) 20 MG tablet Take 0.5 tablets (10 mg total) by mouth daily.  . Vitamin D, Ergocalciferol, (DRISDOL) 50000 units CAPS capsule Take 1 capsule (50,000 Units total) by mouth every 7 (seven) days.  . [DISCONTINUED] predniSONE (DELTASONE) 50 MG tablet One daily for three days with food     Allergies:   Penicillins and Cymbalta [duloxetine hcl]   Social History   Tobacco Use  . Smoking status: Former Smoker    Packs/day: 0.25    Years: 41.00    Pack years: 10.25    Types: Cigarettes    Last attempt to quit: 01/21/2018    Years since quitting: 0.3  . Smokeless tobacco:  Never Used  Substance Use Topics  . Alcohol use: Yes    Comment: Beer occasionally  . Drug use: No     Family Hx: The patient's family history includes Asthma in his brother and sister; COPD in his mother; Diabetes in his brother; Emphysema in his mother; Heart disease in his maternal uncle; Hyperlipidemia in his brother and sister; Hypertension in his brother, brother, and sister; Mental illness in his sister.  ROS:   Please see the history of present illness.    Back pain All other systems reviewed and are negative.   Prior CV studies:   The following studies were reviewed today:  EKG  Labs/Other Tests and Data Reviewed:    EKG:  NSR, rate 112, left axis deviation, left atrial enlargement.  No acute ST-T wave changes.  Recent Labs: 03/01/2018: TSH 1.110 04/04/2018:  ALT 21 04/05/2018: B Natriuretic Peptide 609.6; BUN 21; Creatinine, Ser 1.12; Hemoglobin 15.1; Magnesium 2.0; Platelets 238; Potassium 3.9; Sodium 138   Recent Lipid Panel Lab Results  Component Value Date/Time   CHOL 161 03/01/2018 09:58 AM   TRIG 162 (H) 03/01/2018 09:58 AM   HDL 40 03/01/2018 09:58 AM   CHOLHDL 4.0 03/01/2018 09:58 AM   LDLCALC 89 03/01/2018 09:58 AM    Wt Readings from Last 3 Encounters:  05/24/18 215 lb (97.5 kg)  05/15/18 221 lb (100.2 kg)  04/12/18 208 lb (94.3 kg)     Objective:    Vital Signs:  Ht 5\' 8"  (1.727 m)   Wt 215 lb (97.5 kg)   BMI 32.69 kg/m    NA  ASSESSMENT & PLAN:    TACHYCARDIA:    I am going to start with a 48-hour Holter monitor.  I will have a low threshold for echocardiography but probably would like to see him back in the office first.  I will arrange to see him after the monitor.  DM:  A1C is 6.1.  No change in therapy.   COVID-19 Education: The signs and symptoms of COVID-19 were discussed with the patient and how to seek care for testing (follow up with PCP or arrange E-visit).  The importance of social distancing was discussed today.  Time:    Today, I have spent 25 minutes with the patient with telehealth technology discussing the above problems.     Medication Adjustments/Labs and Tests Ordered: Current medicines are reviewed at length with the patient today.  Concerns regarding medicines are outlined above.   Tests Ordered: No orders of the defined types were placed in this encounter.   Medication Changes: No orders of the defined types were placed in this encounter.   Disposition:  Follow up with me after the Holter  Signed, Rollene Rotunda, MD  05/24/2018 1:51 PM    [facility]

## 2018-05-24 ENCOUNTER — Telehealth (INDEPENDENT_AMBULATORY_CARE_PROVIDER_SITE_OTHER): Payer: Medicare HMO | Admitting: Cardiology

## 2018-05-24 ENCOUNTER — Telehealth: Payer: Self-pay

## 2018-05-24 ENCOUNTER — Telehealth (INDEPENDENT_AMBULATORY_CARE_PROVIDER_SITE_OTHER): Payer: Medicare HMO | Admitting: Family Medicine

## 2018-05-24 ENCOUNTER — Encounter: Payer: Self-pay | Admitting: Cardiology

## 2018-05-24 ENCOUNTER — Telehealth: Payer: Medicare HMO | Admitting: Cardiology

## 2018-05-24 ENCOUNTER — Other Ambulatory Visit: Payer: Self-pay

## 2018-05-24 VITALS — Ht 68.0 in | Wt 215.0 lb

## 2018-05-24 DIAGNOSIS — T50901D Poisoning by unspecified drugs, medicaments and biological substances, accidental (unintentional), subsequent encounter: Secondary | ICD-10-CM | POA: Diagnosis not present

## 2018-05-24 DIAGNOSIS — Z79899 Other long term (current) drug therapy: Secondary | ICD-10-CM

## 2018-05-24 DIAGNOSIS — M961 Postlaminectomy syndrome, not elsewhere classified: Secondary | ICD-10-CM

## 2018-05-24 DIAGNOSIS — R000 Tachycardia, unspecified: Secondary | ICD-10-CM

## 2018-05-24 DIAGNOSIS — Z79891 Long term (current) use of opiate analgesic: Secondary | ICD-10-CM | POA: Diagnosis not present

## 2018-05-24 DIAGNOSIS — R002 Palpitations: Secondary | ICD-10-CM

## 2018-05-24 MED ORDER — DIAZEPAM 5 MG/5ML PO SOLN: ORAL | 0 refills | Status: AC

## 2018-05-24 MED ORDER — CLONIDINE HCL 0.1 MG PO TABS: 0.1000 mg | ORAL_TABLET | Freq: Three times a day (TID) | ORAL | 0 refills | Status: DC

## 2018-05-24 NOTE — Telephone Encounter (Signed)
LM for Karene Fry of [facility], Referral Coordinator regarding pt for est care ov.

## 2018-05-24 NOTE — Patient Instructions (Addendum)
Medication Instructions:  Continue current medications  If you need a refill on your cardiac medications before your next appointment, please call your pharmacy.  Labwork: None Ordered   Testing/Procedures: None Ordered  Follow-Up: .     Your physician recommends that you schedule a follow-up appointment in: After Monitor   At [facility], you and your health needs are our priority.  As part of our continuing mission to provide you with exceptional heart care, we have created designated Provider Care Teams.  These Care Teams include your primary Cardiologist (physician) and Advanced Practice Providers (APPs -  Physician Assistants and Nurse Practitioners) who all work together to provide you with the care you need, when you need it.  Thank you for choosing [facility] at [facility]!!

## 2018-05-24 NOTE — Progress Notes (Signed)
Virtual Visit Note  I connected with patient on 05/24/18 at 1143am by phone and verified that I am speaking with the correct person using two identifiers. Scott [name] is currently located at home and patient is currently with them during visit. The provider, Myles Lipps, MD is located in their office at time of visit.  I discussed the limitations, risks, security and privacy concerns of performing an evaluation and management service by telephone and the availability of in person appointments. I also discussed with the patient that there may be a patient responsible charge related to this service. The patient expressed understanding and agreed to proceed.   CC: med refill of percocet and valium  HPI   Patient is a 58 y.o. male with past medical history significant for DM2,HTNHLP, severe COPD and chronic painon opiates who presents today for followup  ? Last OV 4/6/220 On valium wean On 05/15/2018 - seen in ER for accidental OD brought by EMS after being found unresponsive in his car, required intranasal and IV narcan He reports he had 3 beers during wake of his brother that died from covid05-12-2022 in [place] Has appt with [facility] on may 12th pmp reviewed, last refill 04/25/2018  Prior to this event had ER visit concerning for drug seeking behavior and an inappropriate UDS.  Allergies  Allergen Reactions  . Penicillins Anaphylaxis    Has patient had a PCN reaction causing immediate rash, facial/tongue/throat swelling, SOB or lightheadedness with hypotension: Yes Has patient had a PCN reaction causing severe rash involving mucus membranes or skin necrosis: Yes Has patient had a PCN reaction that required hospitalization: Yes Has patient had a PCN reaction occurring within the last 10 years: No If all of the above answers are "NO", then may proceed with Cephalosporin use.   Marland Kitchen Cymbalta [Duloxetine Hcl]     "crazy thoughts"    Prior to Admission medications    Medication Sig Start Date End Date Taking? Authorizing Provider  Acetaminophen-Aspirin Buffered (EXCEDRIN BACK & BODY PO) Take 1 tablet by mouth daily as needed.    [provider]  albuterol (VENTOLIN HFA) 108 (90 Base) MCG/ACT inhaler Inhale 1-2 puffs into the lungs every 6 (six) hours as needed (wheezing or shortness of breath). 05/02/18   Leslye Peer, MD  COMBIVENT RESPIMAT 20-100 MCG/ACT AERS respimat INHALE 1 PUFF BY MOUTH INTO THE LUNGS FOUR TIMES A DAY 05/08/18   Byrum, Les Pou, MD  cyclobenzaprine (FLEXERIL) 10 MG tablet TAKE 1 TABLET BY MOUTH 3 TIMES DAILY AS NEEDED FOR MUSCLE SPASMS 05/09/18   Myles Lipps, MD  diazePAM 5 MG/5ML SOLN Take 0.4 mLs (0.4 mg total) by mouth 2 (two) times daily for 30 days. 04/25/18 05/25/18  Myles Lipps, MD  fluconazole (DIFLUCAN) 100 MG tablet Take 1 tablet (100 mg total) by mouth daily. 04/18/18   Elvina Sidle, MD  Fluticasone-Salmeterol (ADVAIR) 250-50 MCG/DOSE AEPB Inhale 1 puff into the lungs 2 (two) times daily. 04/08/18   Leslye Peer, MD  gabapentin (NEURONTIN) 300 MG capsule TAKE 1 CAPSULE BY MOUTH 3 TIMES DAILY 05/09/18   Myles Lipps, MD  hydrochlorothiazide (HYDRODIURIL) 12.5 MG tablet Take 1 tablet (12.5 mg total) by mouth daily. 10/25/17   Myles Lipps, MD  ipratropium-albuterol (DUONEB) 0.5-2.5 (3) MG/3ML SOLN INHALE THE CONTENTS OF 1 VIAL VIA NEBULIZER EVERY 6 HOURS AS DIRECTED FOR SHORTNESS OF BREATH OR WHEEZING 04/10/18   Leslye Peer, MD  JANUMET 50-500 MG tablet  TAKE 1 TABLET BY MOUTH ONCE A DAY 05/09/18   Myles Lipps, MD  nicotine polacrilex (NICORETTE) 2 MG gum CHEW 1 PIECE BY MOUTH AS NEEDED FOR SMOKING CESSATION Patient taking differently: Take 2 mg by mouth as needed for smoking cessation.  03/13/18   Myles Lipps, MD  nystatin (MYCOSTATIN) 100000 UNIT/ML suspension Take 5 mLs (500,000 Units total) by mouth 4 (four) times daily. Swish and swallow 04/29/18   Myles Lipps, MD  omeprazole (PRILOSEC) 40  MG capsule TAKE 1 CAPSULE BY MOUTH EVERY DAY Patient taking differently: Take 40 mg by mouth daily. TAKE 1 CAPSULE BY MOUTH EVERY DAY 10/25/17   Myles Lipps, MD  oxyCODONE-acetaminophen (PERCOCET) 10-325 MG tablet Take 1 tablet by mouth 5 (five) times daily for 30 days. 04/25/18 05/25/18  Myles Lipps, MD  predniSONE (DELTASONE) 50 MG tablet One daily for three days with food 04/18/18   Elvina Sidle, MD  rosuvastatin (CRESTOR) 20 MG tablet Take 0.5 tablets (10 mg total) by mouth daily. 09/25/17   Myles Lipps, MD  Vitamin D, Ergocalciferol, (DRISDOL) 50000 units CAPS capsule Take 1 capsule (50,000 Units total) by mouth every 7 (seven) days. 10/27/17   Myles Lipps, MD    Past Medical History:  Diagnosis Date  . 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 COPD (chronic obstructive pulmonary disease) (HCC)   . Diabetes mellitus without complication (HCC)   . GERD (gastroesophageal reflux disease)   . Headache(784.0)   . Pneumonia    hx    Past Surgical History:  Procedure Laterality Date  . ANTERIOR CERVICAL DECOMP/DISCECTOMY FUSION  12/22/2010   Procedure: ANTERIOR CERVICAL DECOMPRESSION/DISCECTOMY FUSION 3 LEVELS;  Surgeon: Cristi Loron;  Location: [facility];  Service: Neurosurgery;  Laterality: N/A;  Anterior cervical discectomy with fusion cervical three-four, four-five, and five sixCDF with Interbody Prosthesis, plating, and Bone Graft   . ANTERIOR CERVICAL DECOMP/DISCECTOMY FUSION N/A 10/16/2012   Procedure: CERVICAL SIX-SEVEN ANTERIOR CERVICAL DECOMPRESSION/DISCECTOMY FUSION WITH INTERBODY PROTHESIS PLATING BONEGRAFT WITH /POSSIBLE HARDWARE REMOVAL OLD PLATE;  Surgeon: Cristi Loron, MD;  Location: [facility];  Service: Neurosurgery;  Laterality: N/A;  . MULTIPLE TOOTH EXTRACTIONS    . OTHER SURGICAL HISTORY     surgery on cheekbone and head for fall 2000  . 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 SPINE SURGERY     2014 and 2016 - Dr Lovell Sheehan  . TONSILLECTOMY      Social History   Tobacco Use  . Smoking status: Former Smoker    Packs/day: 0.25    Years: 41.00    Pack years: 10.25    Types: Cigarettes    Last attempt to quit: 01/21/2018    Years since quitting: 0.3  . Smokeless tobacco: Never Used  Substance Use Topics  . Alcohol use: Yes    Comment: Beer occasionally    Family History  Problem Relation Age of Onset  . Emphysema Mother   . COPD Mother   . Asthma Brother   . Diabetes Brother   . Hyperlipidemia Brother   . Hypertension Brother   . Asthma Sister   . Hyperlipidemia Sister   . Hypertension Sister   . Mental illness Sister   . Heart disease Maternal Uncle   . Hypertension Brother  ROS  Per hpi  Objective  Vitals as reported by the patient: none   ASSESSMENT and PLAN  1. Medication management 2. Long term prescription opiate use 3. Long term prescription benzodiazepine use 4. Cervical post-laminectomy syndrome 5. Accidental overdose, subsequent encounter Discussed that recent behaviors have been concerning for addiction which he denies. I discussed that I do not think sole pain mgt is appropriate, that I believe he needs a provider with expertise in addiction as well as pain mgt so that his needs can be safely addressed. Will complete a fast wean on valium. Will not refill his percocet given recent overdose. Rx for temporary rx for clonidine given. - Ambulatory referral to Chemical Dependency  Other orders - clonidine (CATAPRES) 0.1mg  by mouth three times a day - diazePAM 5 MG/5ML SOLN; Take 0.3 mLs (0.3 mg total) by mouth 2 (two) times daily for 2 days, THEN 0.2 mLs (0.2 mg total) 2 (two) times daily for 2 days, THEN 0.1 mLs (0.1 mg total) 2 (two) times daily for 2 days.  FOLLOW-UP: as scheduled for DM2, HTN and HLP   The above assessment and management plan was discussed with the  patient. The patient verbalized understanding of and has agreed to the management plan. Patient is aware to call the clinic if symptoms persist or worsen. Patient is aware when to return to the clinic for a follow-up visit. Patient educated on when it is appropriate to go to the emergency department.    I provided 25 minutes of non-face-to-face time during this encounter.  Myles LippsIrma M Santiago, MD [facility] [address] [place], [place] [id number] Ph.  [phone] Fax [phone]

## 2018-05-24 NOTE — Progress Notes (Signed)
No medical concerns today. Just refills on controlled substances

## 2018-05-26 ENCOUNTER — Emergency Department (HOSPITAL_COMMUNITY): Payer: Medicare HMO

## 2018-05-26 ENCOUNTER — Encounter (HOSPITAL_COMMUNITY): Payer: Self-pay | Admitting: *Deleted

## 2018-05-26 ENCOUNTER — Inpatient Hospital Stay (HOSPITAL_COMMUNITY)
Admission: EM | Admit: 2018-05-26 | Discharge: 2018-05-28 | DRG: 190 | Disposition: A | Discharge status: Home / Self Care | Payer: Medicare HMO | Attending: Internal Medicine | Admitting: Internal Medicine

## 2018-05-26 ENCOUNTER — Other Ambulatory Visit: Payer: Self-pay

## 2018-05-26 DIAGNOSIS — M47812 Spondylosis without myelopathy or radiculopathy, cervical region: Secondary | ICD-10-CM | POA: Diagnosis present

## 2018-05-26 DIAGNOSIS — Z825 Family history of asthma and other chronic lower respiratory diseases: Secondary | ICD-10-CM | POA: Diagnosis not present

## 2018-05-26 DIAGNOSIS — E785 Hyperlipidemia, unspecified: Secondary | ICD-10-CM | POA: Diagnosis present

## 2018-05-26 DIAGNOSIS — F17200 Nicotine dependence, unspecified, uncomplicated: Secondary | ICD-10-CM | POA: Diagnosis not present

## 2018-05-26 DIAGNOSIS — Z8349 Family history of other endocrine, nutritional and metabolic diseases: Secondary | ICD-10-CM | POA: Diagnosis not present

## 2018-05-26 DIAGNOSIS — R0902 Hypoxemia: Secondary | ICD-10-CM

## 2018-05-26 DIAGNOSIS — F1290 Cannabis use, unspecified, uncomplicated: Secondary | ICD-10-CM | POA: Diagnosis present

## 2018-05-26 DIAGNOSIS — E119 Type 2 diabetes mellitus without complications: Secondary | ICD-10-CM | POA: Diagnosis not present

## 2018-05-26 DIAGNOSIS — R000 Tachycardia, unspecified: Secondary | ICD-10-CM | POA: Diagnosis not present

## 2018-05-26 DIAGNOSIS — J9811 Atelectasis: Secondary | ICD-10-CM | POA: Diagnosis not present

## 2018-05-26 DIAGNOSIS — R9431 Abnormal electrocardiogram [ECG] [EKG]: Secondary | ICD-10-CM | POA: Diagnosis present

## 2018-05-26 DIAGNOSIS — I10 Essential (primary) hypertension: Secondary | ICD-10-CM

## 2018-05-26 DIAGNOSIS — R110 Nausea: Secondary | ICD-10-CM | POA: Diagnosis present

## 2018-05-26 DIAGNOSIS — M961 Postlaminectomy syndrome, not elsewhere classified: Secondary | ICD-10-CM | POA: Diagnosis not present

## 2018-05-26 DIAGNOSIS — J441 Chronic obstructive pulmonary disease with (acute) exacerbation: Secondary | ICD-10-CM | POA: Diagnosis not present

## 2018-05-26 DIAGNOSIS — R112 Nausea with vomiting, unspecified: Secondary | ICD-10-CM | POA: Diagnosis not present

## 2018-05-26 DIAGNOSIS — F13239 Sedative, hypnotic or anxiolytic dependence with withdrawal, unspecified: Secondary | ICD-10-CM | POA: Diagnosis present

## 2018-05-26 DIAGNOSIS — R0602 Shortness of breath: Secondary | ICD-10-CM | POA: Diagnosis not present

## 2018-05-26 DIAGNOSIS — J209 Acute bronchitis, unspecified: Secondary | ICD-10-CM | POA: Diagnosis not present

## 2018-05-26 DIAGNOSIS — G894 Chronic pain syndrome: Secondary | ICD-10-CM | POA: Diagnosis present

## 2018-05-26 DIAGNOSIS — Z20828 Contact with and (suspected) exposure to other viral communicable diseases: Secondary | ICD-10-CM | POA: Diagnosis not present

## 2018-05-26 DIAGNOSIS — F1910 Other psychoactive substance abuse, uncomplicated: Secondary | ICD-10-CM | POA: Diagnosis present

## 2018-05-26 DIAGNOSIS — Z818 Family history of other mental and behavioral disorders: Secondary | ICD-10-CM

## 2018-05-26 DIAGNOSIS — F419 Anxiety disorder, unspecified: Secondary | ICD-10-CM | POA: Diagnosis present

## 2018-05-26 DIAGNOSIS — Z720 Tobacco use: Secondary | ICD-10-CM

## 2018-05-26 DIAGNOSIS — B370 Candidal stomatitis: Secondary | ICD-10-CM | POA: Diagnosis not present

## 2018-05-26 DIAGNOSIS — K219 Gastro-esophageal reflux disease without esophagitis: Secondary | ICD-10-CM | POA: Diagnosis present

## 2018-05-26 DIAGNOSIS — F13230 Sedative, hypnotic or anxiolytic dependence with withdrawal, uncomplicated: Secondary | ICD-10-CM | POA: Diagnosis not present

## 2018-05-26 DIAGNOSIS — F1123 Opioid dependence with withdrawal: Secondary | ICD-10-CM

## 2018-05-26 DIAGNOSIS — Z8701 Personal history of pneumonia (recurrent): Secondary | ICD-10-CM

## 2018-05-26 DIAGNOSIS — Z981 Arthrodesis status: Secondary | ICD-10-CM

## 2018-05-26 DIAGNOSIS — R0689 Other abnormalities of breathing: Secondary | ICD-10-CM | POA: Diagnosis not present

## 2018-05-26 DIAGNOSIS — Z833 Family history of diabetes mellitus: Secondary | ICD-10-CM | POA: Diagnosis not present

## 2018-05-26 DIAGNOSIS — J9601 Acute respiratory failure with hypoxia: Secondary | ICD-10-CM | POA: Diagnosis not present

## 2018-05-26 DIAGNOSIS — Z79899 Other long term (current) drug therapy: Secondary | ICD-10-CM

## 2018-05-26 DIAGNOSIS — F1920 Other psychoactive substance dependence, uncomplicated: Secondary | ICD-10-CM

## 2018-05-26 DIAGNOSIS — J440 Chronic obstructive pulmonary disease with acute lower respiratory infection: Secondary | ICD-10-CM

## 2018-05-26 DIAGNOSIS — Z8249 Family history of ischemic heart disease and other diseases of the circulatory system: Secondary | ICD-10-CM

## 2018-05-26 LAB — GLUCOSE, CAPILLARY: Glucose-Capillary: 167 mg/dL — ABNORMAL HIGH (ref 70–99)

## 2018-05-26 LAB — CBC WITH DIFFERENTIAL/PLATELET
Abs Immature Granulocytes: 0.02 10*3/uL (ref 0.00–0.07)
Basophils Absolute: 0 10*3/uL (ref 0.0–0.1)
Basophils Relative: 0 %
Eosinophils Absolute: 0.1 10*3/uL (ref 0.0–0.5)
Eosinophils Relative: 1 %
HCT: 46.1 % (ref 39.0–52.0)
Hemoglobin: 14.3 g/dL (ref 13.0–17.0)
Immature Granulocytes: 0 %
Lymphocytes Relative: 18 %
Lymphs Abs: 1.3 10*3/uL (ref 0.7–4.0)
MCH: 29.4 pg (ref 26.0–34.0)
MCHC: 31 g/dL (ref 30.0–36.0)
MCV: 94.7 fL (ref 80.0–100.0)
Monocytes Absolute: 0.6 10*3/uL (ref 0.1–1.0)
Monocytes Relative: 8 %
Neutro Abs: 5.3 10*3/uL (ref 1.7–7.7)
Neutrophils Relative %: 73 %
Platelets: 217 10*3/uL (ref 150–400)
RBC: 4.87 MIL/uL (ref 4.22–5.81)
RDW: 14.9 % (ref 11.5–15.5)
WBC: 7.4 10*3/uL (ref 4.0–10.5)
nRBC: 0 % (ref 0.0–0.2)

## 2018-05-26 LAB — COMPREHENSIVE METABOLIC PANEL
ALT: 17 U/L (ref 0–44)
AST: 31 U/L (ref 15–41)
Albumin: 3.8 g/dL (ref 3.5–5.0)
Alkaline Phosphatase: 73 U/L (ref 38–126)
Anion gap: 8 (ref 5–15)
BUN: 9 mg/dL (ref 6–20)
CO2: 26 mmol/L (ref 22–32)
Calcium: 8.7 mg/dL — ABNORMAL LOW (ref 8.9–10.3)
Chloride: 107 mmol/L (ref 98–111)
Creatinine, Ser: 1.1 mg/dL (ref 0.61–1.24)
GFR calc Af Amer: >60 mL/min (ref >60)
GFR calc non Af Amer: >60 mL/min (ref >60)
Glucose, Bld: 125 mg/dL — ABNORMAL HIGH (ref 70–99)
Potassium: 4.9 mmol/L (ref 3.5–5.1)
Sodium: 141 mmol/L (ref 135–145)
Total Bilirubin: 1.2 mg/dL (ref 0.3–1.2)
Total Protein: 6.9 g/dL (ref 6.5–8.1)

## 2018-05-26 LAB — CBG MONITORING, ED: Glucose-Capillary: 138 mg/dL — ABNORMAL HIGH (ref 70–99)

## 2018-05-26 LAB — FERRITIN: Ferritin: 83 ng/mL (ref 24–336)

## 2018-05-26 LAB — PROTIME-INR
INR: 0.9 (ref 0.8–1.2)
Prothrombin Time: 12 seconds (ref 11.4–15.2)

## 2018-05-26 LAB — PROCALCITONIN: Procalcitonin: <0.1 ng/mL

## 2018-05-26 LAB — SARS CORONAVIRUS 2 BY RT PCR (HOSPITAL ORDER, PERFORMED IN ~~LOC~~ HOSPITAL LAB): SARS Coronavirus 2: NEGATIVE

## 2018-05-26 LAB — TRIGLYCERIDES: Triglycerides: 140 mg/dL (ref <150)

## 2018-05-26 LAB — D-DIMER, QUANTITATIVE: D-Dimer, Quant: 0.6 ug/mL-FEU — ABNORMAL HIGH (ref 0.00–0.50)

## 2018-05-26 LAB — LACTATE DEHYDROGENASE: LDH: 286 U/L — ABNORMAL HIGH (ref 98–192)

## 2018-05-26 LAB — C-REACTIVE PROTEIN: CRP: <0.8 mg/dL (ref <1.0)

## 2018-05-26 LAB — LACTIC ACID, PLASMA: Lactic Acid, Venous: 1 mmol/L (ref 0.5–1.9)

## 2018-05-26 LAB — FIBRINOGEN: Fibrinogen: 433 mg/dL (ref 210–475)

## 2018-05-26 MED ORDER — IPRATROPIUM BROMIDE HFA 17 MCG/ACT IN AERS
4.0000 | INHALATION_SPRAY | Freq: Once | RESPIRATORY_TRACT | Status: AC
  Administered 2018-05-26: 4 via RESPIRATORY_TRACT

## 2018-05-26 MED ORDER — SODIUM CHLORIDE 0.9 % IV SOLN
100.0000 mg | Freq: Two times a day (BID) | INTRAVENOUS | Status: DC
  Administered 2018-05-26 – 2018-05-28 (×4): 100 mg via INTRAVENOUS
  Filled 2018-05-26 (×5): qty 100

## 2018-05-26 MED ORDER — VITAMIN B-1 100 MG PO TABS
100.0000 mg | ORAL_TABLET | Freq: Every day | ORAL | Status: DC
  Administered 2018-05-26 – 2018-05-28 (×3): 100 mg via ORAL
  Filled 2018-05-26 (×3): qty 1

## 2018-05-26 MED ORDER — SODIUM CHLORIDE 0.9 % IV SOLN
1000.0000 mL | INTRAVENOUS | Status: DC
  Administered 2018-05-26 (×2): 1000 mL via INTRAVENOUS

## 2018-05-26 MED ORDER — ACETAMINOPHEN 325 MG PO TABS: 650.0000 mg | ORAL_TABLET | Freq: Four times a day (QID) | ORAL | Status: DC | PRN

## 2018-05-26 MED ORDER — IOHEXOL 350 MG/ML SOLN
100.0000 mL | Freq: Once | INTRAVENOUS | Status: AC | PRN
  Administered 2018-05-26: 100 mL via INTRAVENOUS

## 2018-05-26 MED ORDER — ALBUTEROL SULFATE HFA 108 (90 BASE) MCG/ACT IN AERS
10.0000 | INHALATION_SPRAY | RESPIRATORY_TRACT | Status: DC | PRN
  Administered 2018-05-26: 10 via RESPIRATORY_TRACT
  Filled 2018-05-26: qty 6.7

## 2018-05-26 MED ORDER — OXYCODONE-ACETAMINOPHEN 5-325 MG PO TABS
2.0000 | ORAL_TABLET | Freq: Once | ORAL | Status: AC
  Administered 2018-05-26: 2 via ORAL
  Filled 2018-05-26: qty 2

## 2018-05-26 MED ORDER — ALBUTEROL SULFATE HFA 108 (90 BASE) MCG/ACT IN AERS
10.0000 | INHALATION_SPRAY | RESPIRATORY_TRACT | Status: DC | PRN
  Filled 2018-05-26: qty 6.7

## 2018-05-26 MED ORDER — OXYCODONE-ACETAMINOPHEN 10-325 MG PO TABS: 1.0000 | ORAL_TABLET | Freq: Four times a day (QID) | ORAL | Status: DC | PRN

## 2018-05-26 MED ORDER — ROSUVASTATIN CALCIUM 10 MG PO TABS
10.0000 mg | ORAL_TABLET | Freq: Every day | ORAL | Status: DC
  Administered 2018-05-26 – 2018-05-28 (×3): 10 mg via ORAL
  Filled 2018-05-26 (×3): qty 1

## 2018-05-26 MED ORDER — MAGNESIUM SULFATE 2 GM/50ML IV SOLN
2.0000 g | Freq: Once | INTRAVENOUS | Status: AC
  Administered 2018-05-26: 2 g via INTRAVENOUS
  Filled 2018-05-26: qty 50

## 2018-05-26 MED ORDER — IPRATROPIUM-ALBUTEROL 0.5-2.5 (3) MG/3ML IN SOLN: 3.0000 mL | RESPIRATORY_TRACT | Status: DC | PRN

## 2018-05-26 MED ORDER — IPRATROPIUM-ALBUTEROL 0.5-2.5 (3) MG/3ML IN SOLN
3.0000 mL | Freq: Four times a day (QID) | RESPIRATORY_TRACT | Status: DC
  Administered 2018-05-26 – 2018-05-27 (×5): 3 mL via RESPIRATORY_TRACT
  Filled 2018-05-26 (×5): qty 3

## 2018-05-26 MED ORDER — LINAGLIPTIN 5 MG PO TABS
5.0000 mg | ORAL_TABLET | Freq: Every day | ORAL | Status: DC
  Administered 2018-05-26: 5 mg via ORAL
  Filled 2018-05-26: qty 1

## 2018-05-26 MED ORDER — SODIUM CHLORIDE (PF) 0.9 % IJ SOLN
INTRAMUSCULAR | Status: AC
  Administered 2018-05-26: 10 mL
  Filled 2018-05-26: qty 50

## 2018-05-26 MED ORDER — METHYLPREDNISOLONE SODIUM SUCC 125 MG IJ SOLR
60.0000 mg | Freq: Four times a day (QID) | INTRAMUSCULAR | Status: DC
  Administered 2018-05-26 – 2018-05-27 (×2): 60 mg via INTRAVENOUS
  Filled 2018-05-26 (×2): qty 2

## 2018-05-26 MED ORDER — VITAMIN D (ERGOCALCIFEROL) 1.25 MG (50000 UNIT) PO CAPS: 50000.0000 [IU] | ORAL_CAPSULE | ORAL | Status: DC

## 2018-05-26 MED ORDER — MAGIC MOUTHWASH
5.0000 mL | Freq: Three times a day (TID) | ORAL | Status: DC
  Administered 2018-05-26 – 2018-05-28 (×4): 5 mL via ORAL
  Filled 2018-05-26 (×6): qty 5

## 2018-05-26 MED ORDER — SITAGLIPTIN PHOS-METFORMIN HCL 50-500 MG PO TABS: 1.0000 | ORAL_TABLET | Freq: Every day | ORAL | Status: DC

## 2018-05-26 MED ORDER — ALBUTEROL SULFATE (2.5 MG/3ML) 0.083% IN NEBU: 5.0000 mg | INHALATION_SOLUTION | Freq: Once | RESPIRATORY_TRACT | Status: DC

## 2018-05-26 MED ORDER — METHYLPREDNISOLONE SODIUM SUCC 125 MG IJ SOLR
125.0000 mg | Freq: Once | INTRAMUSCULAR | Status: AC
  Administered 2018-05-26: 125 mg via INTRAVENOUS
  Filled 2018-05-26: qty 2

## 2018-05-26 MED ORDER — PANTOPRAZOLE SODIUM 40 MG PO TBEC
40.0000 mg | DELAYED_RELEASE_TABLET | Freq: Every day | ORAL | Status: DC
  Administered 2018-05-27 – 2018-05-28 (×2): 40 mg via ORAL
  Filled 2018-05-26 (×2): qty 1

## 2018-05-26 MED ORDER — ONDANSETRON HCL 4 MG/2ML IJ SOLN
4.0000 mg | Freq: Once | INTRAMUSCULAR | Status: AC
  Administered 2018-05-26: 4 mg via INTRAVENOUS
  Filled 2018-05-26: qty 2

## 2018-05-26 MED ORDER — DIAZEPAM 1 MG/ML PO SOLN
0.2000 mg | Freq: Once | ORAL | Status: AC
  Administered 2018-05-26: 0.2 mg via ORAL
  Filled 2018-05-26: qty 5

## 2018-05-26 MED ORDER — ACETAMINOPHEN 650 MG RE SUPP: 650.0000 mg | Freq: Four times a day (QID) | RECTAL | Status: DC | PRN

## 2018-05-26 MED ORDER — HYDROCHLOROTHIAZIDE 25 MG PO TABS
12.5000 mg | ORAL_TABLET | Freq: Every day | ORAL | Status: DC
  Administered 2018-05-26 – 2018-05-28 (×3): 12.5 mg via ORAL
  Filled 2018-05-26 (×3): qty 1

## 2018-05-26 MED ORDER — OXYCODONE-ACETAMINOPHEN 5-325 MG PO TABS
1.0000 | ORAL_TABLET | Freq: Four times a day (QID) | ORAL | Status: DC | PRN
  Administered 2018-05-26 – 2018-05-27 (×2): 1 via ORAL
  Filled 2018-05-26 (×2): qty 1

## 2018-05-26 MED ORDER — OXYCODONE HCL 5 MG PO TABS
5.0000 mg | ORAL_TABLET | Freq: Four times a day (QID) | ORAL | Status: DC | PRN
  Administered 2018-05-26 – 2018-05-27 (×2): 5 mg via ORAL
  Filled 2018-05-26 (×2): qty 1

## 2018-05-26 MED ORDER — INSULIN ASPART 100 UNIT/ML ~~LOC~~ SOLN
0.0000 [IU] | Freq: Three times a day (TID) | SUBCUTANEOUS | Status: DC
  Administered 2018-05-27 – 2018-05-28 (×3): 3 [IU] via SUBCUTANEOUS
  Administered 2018-05-28: 2 [IU] via SUBCUTANEOUS

## 2018-05-26 MED ORDER — ENOXAPARIN SODIUM 40 MG/0.4ML ~~LOC~~ SOLN
40.0000 mg | SUBCUTANEOUS | Status: DC
  Administered 2018-05-26 – 2018-05-27 (×2): 40 mg via SUBCUTANEOUS
  Filled 2018-05-26 (×2): qty 0.4

## 2018-05-26 MED ORDER — IPRATROPIUM BROMIDE HFA 17 MCG/ACT IN AERS
4.0000 | INHALATION_SPRAY | Freq: Once | RESPIRATORY_TRACT | Status: AC
  Administered 2018-05-26: 4 via RESPIRATORY_TRACT
  Filled 2018-05-26: qty 12.9

## 2018-05-26 MED ORDER — DIAZEPAM 5 MG/5ML PO SOLN
0.2000 mg | Freq: Two times a day (BID) | ORAL | Status: DC
  Administered 2018-05-27 – 2018-05-28 (×3): 0.2 mg via ORAL
  Filled 2018-05-26 (×3): qty 5

## 2018-05-26 MED ORDER — GABAPENTIN 300 MG PO CAPS
300.0000 mg | ORAL_CAPSULE | Freq: Three times a day (TID) | ORAL | Status: DC
  Administered 2018-05-26 – 2018-05-28 (×5): 300 mg via ORAL
  Filled 2018-05-26 (×5): qty 1

## 2018-05-26 NOTE — ED Notes (Signed)
ED TO INPATIENT HANDOFF REPORT  ED Nurse Name and Phone #:  Rayvon Char Name/Age/Gender Scott [name] 58 y.o. male Room/Bed: WA19/WA19  Code Status   Code Status: Full Code  Home/SNF/Other Home Patient oriented to: self, place, time and situation Is this baseline? Yes   Triage Complete: Triage complete  Chief Complaint Shortness of Breath  Triage Note EMS reports pt stated he started with increased SHOB last night, diaphoretic, Sats 60s on arrival up to 100 % on NRB, without 02 desats to 80s   Allergies Allergies  Allergen Reactions  . Penicillins Anaphylaxis    Has patient had a PCN reaction causing immediate rash, facial/tongue/throat swelling, SOB or lightheadedness with hypotension: Yes Has patient had a PCN reaction causing severe rash involving mucus membranes or skin necrosis: Yes Has patient had a PCN reaction that required hospitalization: Yes Has patient had a PCN reaction occurring within the last 10 years: No If all of the above answers are "NO", then may proceed with Cephalosporin use.   Marland Kitchen Cymbalta [Duloxetine Hcl]     "crazy thoughts"    Level of Care/Admitting Diagnosis ED Disposition    ED Disposition Condition Comment   Admit  Hospital Area: [facility] [[id number]]  Level of Care: Telemetry [5]  Admit to tele based on following criteria: Monitor QTC interval  Covid Evaluation: N/A  Diagnosis: Acute respiratory failure (HCC) [518.81.ICD-9-CM]  Admitting Physician: Alessandra Bevels [[id number]]  Attending Physician: Alessandra Bevels [[id number]]  Estimated length of stay: 3 - 4 days  Certification:: I certify this patient will need inpatient services for at least 2 midnights  PT Class (Do Not Modify): Inpatient [101]  PT Acc Code (Do Not Modify): Private [1]       B Medical/Surgery History Past Medical History:  Diagnosis Date  . 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 COPD (chronic obstructive pulmonary  disease) (HCC)   . Diabetes mellitus without complication (HCC)   . GERD (gastroesophageal reflux disease)   . Headache(784.0)   . Pneumonia    hx   Past Surgical History:  Procedure Laterality Date  . ANTERIOR CERVICAL DECOMP/DISCECTOMY FUSION  12/22/2010   Procedure: ANTERIOR CERVICAL DECOMPRESSION/DISCECTOMY FUSION 3 LEVELS;  Surgeon: Cristi Loron;  Location: [facility];  Service: Neurosurgery;  Laterality: N/A;  Anterior cervical discectomy with fusion cervical three-four, four-five, and five sixCDF with Interbody Prosthesis, plating, and Bone Graft   . ANTERIOR CERVICAL DECOMP/DISCECTOMY FUSION N/A 10/16/2012   Procedure: CERVICAL SIX-SEVEN ANTERIOR CERVICAL DECOMPRESSION/DISCECTOMY FUSION WITH INTERBODY PROTHESIS PLATING BONEGRAFT WITH /POSSIBLE HARDWARE REMOVAL OLD PLATE;  Surgeon: Cristi Loron, MD;  Location: [facility];  Service: Neurosurgery;  Laterality: N/A;  . MULTIPLE TOOTH EXTRACTIONS    . OTHER SURGICAL HISTORY     surgery on cheekbone and head for fall 2000  . 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 SPINE SURGERY     2014 and 2016 - Dr Lovell Sheehan  . TONSILLECTOMY       A IV Location/Drains/Wounds Patient Lines/Drains/Airways Status   Active Line/Drains/Airways    Name:   Placement date:   Placement time:   Site:   Days:   Peripheral IV 05/26/18 Left Antecubital   05/26/18    1145    Antecubital   less than 1  Intake/Output Last 24 hours  Intake/Output Summary (Last 24 hours) at 05/26/2018 2053 Last data filed at 05/26/2018 1340 Gross per 24 hour  Intake 50 ml  Output -  Net 50 ml    Labs/Imaging Results for orders placed or performed during the hospital encounter of 05/26/18 (from the past 48 hour(s))  SARS Coronavirus 2 [facility] order, Performed in [facility] hospital lab)     Status: None   Collection Time: 05/26/18 12:09 PM  Result  Value Ref Range   SARS Coronavirus 2 NEGATIVE NEGATIVE    Comment: (NOTE) If result is NEGATIVE SARS-CoV-2 target nucleic acids are NOT DETECTED. The SARS-CoV-2 RNA is generally detectable in upper and lower  respiratory specimens during the acute phase of infection. The lowest  concentration of SARS-CoV-2 viral copies this assay can detect is 250  copies / mL. A negative result does not preclude SARS-CoV-2 infection  and should not be used as the sole basis for treatment or other  patient management decisions.  A negative result may occur with  improper specimen collection / handling, submission of specimen other  than nasopharyngeal swab, presence of viral mutation(s) within the  areas targeted by this assay, and inadequate number of viral copies  (<250 copies / mL). A negative result must be combined with clinical  observations, patient history, and epidemiological information. If result is POSITIVE SARS-CoV-2 target nucleic acids are DETECTED. The SARS-CoV-2 RNA is generally detectable in upper and lower  respiratory specimens dur ing the acute phase of infection.  Positive  results are indicative of active infection with SARS-CoV-2.  Clinical  correlation with patient history and other diagnostic information is  necessary to determine patient infection status.  Positive results do  not rule out bacterial infection or co-infection with other viruses. If result is PRESUMPTIVE POSTIVE SARS-CoV-2 nucleic acids MAY BE PRESENT.   A presumptive positive result was obtained on the submitted specimen  and confirmed on repeat testing.  While 2019 novel coronavirus  (SARS-CoV-2) nucleic acids may be present in the submitted sample  additional confirmatory testing may be necessary for epidemiological  and / or clinical management purposes  to differentiate between  SARS-CoV-2 and other Sarbecovirus currently known to infect humans.  If clinically indicated additional testing with an  alternate test  methodology [phone]) is advised. The SARS-CoV-2 RNA is generally  detectable in upper and lower respiratory sp ecimens during the acute  phase of infection. The expected result is Negative. Fact Sheet for Patients:  BoilerBrush.com.cy Fact Sheet for Healthcare Providers: https://pope.com/ This test is not yet approved or cleared by the [place] FDA and has been authorized for detection and/or diagnosis of SARS-CoV-2 by FDA under an Emergency Use Authorization (EUA).  This EUA will remain in effect (meaning this test can be used) for the duration of the COVID-19 declaration under Section 564(b)(1) of the Act, 21 U.S.C. section 360bbb-3(b)(1), unless the authorization is terminated or revoked sooner. Performed at [facility], [address] [place], [place] [address]   Lactic acid, plasma     Status: None   Collection Time: 05/26/18 12:09 PM  Result Value Ref Range   Lactic Acid, Venous 1.0 0.5 - 1.9 mmol/L    Comment: Performed at [facility], [address] [place], [place] [address]  Comprehensive metabolic panel     Status: Abnormal   Collection Time: 05/26/18 12:09 PM  Result Value Ref Range   Sodium 141 135 - 145 mmol/L   Potassium  4.9 3.5 - 5.1 mmol/L   Chloride 107 98 - 111 mmol/L   CO2 26 22 - 32 mmol/L   Glucose, Bld 125 (H) 70 - 99 mg/dL   BUN 9 6 - 20 mg/dL   Creatinine, Ser 1.61 0.61 - 1.24 mg/dL   Calcium 8.7 (L) 8.9 - 10.3 mg/dL   Total Protein 6.9 6.5 - 8.1 g/dL   Albumin 3.8 3.5 - 5.0 g/dL   AST 31 15 - 41 U/L   ALT 17 0 - 44 U/L   Alkaline Phosphatase 73 38 - 126 U/L   Total Bilirubin 1.2 0.3 - 1.2 mg/dL   GFR calc non Af Amer >60 >60 mL/min   GFR calc Af Amer >60 >60 mL/min   Anion gap 8 5 - 15    Comment: Performed at [facility], [address], [place], [place] [address]  D-dimer, quantitative     Status: Abnormal    Collection Time: 05/26/18 12:09 PM  Result Value Ref Range   D-Dimer, Quant 0.60 (H) 0.00 - 0.50 ug/mL-FEU    Comment: (NOTE) At the manufacturer cut-off of 0.50 ug/mL FEU, this assay has been documented to exclude PE with a sensitivity and negative predictive value of 97 to 99%.  At this time, this assay has not been approved by the FDA to exclude DVT/VTE. Results should be correlated with clinical presentation. Performed at [facility], [address] [place], [place] [address]   Procalcitonin     Status: None   Collection Time: 05/26/18 12:09 PM  Result Value Ref Range   Procalcitonin <0.10 ng/mL    Comment:        Interpretation: PCT (Procalcitonin) <= 0.5 ng/mL: Systemic infection (sepsis) is not likely. Local bacterial infection is possible. REPEATED TO VERIFY (NOTE)       Sepsis PCT Algorithm           Lower Respiratory Tract                                      Infection PCT Algorithm    ----------------------------     ----------------------------         PCT < 0.25 ng/mL                PCT < 0.10 ng/mL         Strongly encourage             Strongly discourage   discontinuation of antibiotics    initiation of antibiotics    ----------------------------     -----------------------------       PCT 0.25 - 0.50 ng/mL            PCT 0.10 - 0.25 ng/mL               OR       >80% decrease in PCT            Discourage initiation of                                            antibiotics      Encourage discontinuation           of antibiotics    ----------------------------     -----------------------------  PCT >= 0.50 ng/mL              PCT 0.26 - 0.50 ng/mL                AND       <80% decrease in PCT             Encourage initiation of                                             antibiotics       Encourage continuation           of antibiotics    ----------------------------     -----------------------------        PCT >= 0.50 ng/mL                   PCT > 0.50 ng/mL               AND         increase in PCT                  Strongly encourage                                      initiation of antibiotics    Strongly encourage escalation           of antibiotics                                     -----------------------------                                           PCT <= 0.25 ng/mL                                                 OR                                        > 80% decrease in PCT                                     Discontinue / Do not initiate                                             antibiotics Performed at [facility], [address] [place], [place] [address]   Lactate dehydrogenase     Status: Abnormal   Collection Time: 05/26/18 12:09 PM  Result Value Ref Range   LDH 286 (H) 98 - 192 U/L    Comment: Performed at [facility], [address], [place], [place] [address]  Ferritin  Status: None   Collection Time: 05/26/18 12:09 PM  Result Value Ref Range   Ferritin 83 24 - 336 ng/mL    Comment: Performed at [facility], [address], [place], [place] [address]  Triglycerides     Status: None   Collection Time: 05/26/18 12:09 PM  Result Value Ref Range   Triglycerides 140 <150 mg/dL    Comment: Performed at [facility], [address], [place], [place] [address]  Fibrinogen     Status: None   Collection Time: 05/26/18 12:09 PM  Result Value Ref Range   Fibrinogen 433 210 - 475 mg/dL    Comment: Performed at [facility], [address], [place], [place] [address]  C-reactive protein     Status: None   Collection Time: 05/26/18 12:09 PM  Result Value Ref Range   CRP <0.8 <1.0 mg/dL    Comment: Performed at [facility], [address], [place], [place] [address]  Protime-INR     Status: None   Collection Time: 05/26/18 12:09 PM  Result Value Ref Range   Prothrombin Time  12.0 11.4 - 15.2 seconds   INR 0.9 0.8 - 1.2    Comment: (NOTE) INR goal varies based on device and disease states. Performed at [facility], [address], [place], [place] [address]   CBC with Differential/Platelet     Status: None   Collection Time: 05/26/18 12:15 PM  Result Value Ref Range   WBC 7.4 4.0 - 10.5 K/uL   RBC 4.87 4.22 - 5.81 MIL/uL   Hemoglobin 14.3 13.0 - 17.0 g/dL   HCT 97.5 88.3 - 25.4 %   MCV 94.7 80.0 - 100.0 fL   MCH 29.4 26.0 - 34.0 pg   MCHC 31.0 30.0 - 36.0 g/dL   RDW 98.2 64.1 - 58.3 %   Platelets 217 150 - 400 K/uL   nRBC 0.0 0.0 - 0.2 %   Neutrophils Relative % 73 %   Neutro Abs 5.3 1.7 - 7.7 K/uL   Lymphocytes Relative 18 %   Lymphs Abs 1.3 0.7 - 4.0 K/uL   Monocytes Relative 8 %   Monocytes Absolute 0.6 0.1 - 1.0 K/uL   Eosinophils Relative 1 %   Eosinophils Absolute 0.1 0.0 - 0.5 K/uL   Basophils Relative 0 %   Basophils Absolute 0.0 0.0 - 0.1 K/uL   Immature Granulocytes 0 %   Abs Immature Granulocytes 0.02 0.00 - 0.07 K/uL    Comment: Performed at [facility], [address] [place], [place] [address]  CBG monitoring, ED     Status: Abnormal   Collection Time: 05/26/18  3:06 PM  Result Value Ref Range   Glucose-Capillary 138 (H) 70 - 99 mg/dL   Ct Angio Chest Pe W And/or Wo Contrast  Result Date: 05/26/2018 CLINICAL DATA:  Increased shortness of breath last night, epigastric pain, elevated D-dimer question pulmonary embolism, history COPD, diabetes mellitus, former smoker EXAM: CT ANGIOGRAPHY CHEST WITH CONTRAST TECHNIQUE: Multidetector CT imaging of the chest was performed using the standard protocol during bolus administration of intravenous contrast. Multiplanar CT image reconstructions and MIPs were obtained to evaluate the vascular anatomy. CONTRAST:  OMNIPAQUE IOHEXOL 350 MG/ML SOLN IV COMPARISON:  None FINDINGS: Cardiovascular: Mild atherosclerotic calcification aorta. Aorta normal caliber  without aneurysm. Heart upper normal size without pericardial effusion. Pulmonary arteries well opacified and patent. No evidence of pulmonary embolism. Mediastinum/Nodes: Esophagus unremarkable. Base of  cervical region normal appearance. Scattered normal size mediastinal lymph nodes. No definite thoracic adenopathy. Lungs/Pleura: Emphysematous changes most severe at apices. Peribronchial thickening. Scattered areas of mucous plugging in the lower lobes. Bibasilar atelectasis. No definite acute infiltrate, pleural effusion or pneumothorax. Upper Abdomen: Visualized upper abdomen unremarkable Musculoskeletal: Prior cervical spine fusion. No acute osseous abnormalities. Review of the MIP images confirms the above findings. IMPRESSION: No evidence of pulmonary embolism. Bronchitic changes with scattered areas of mucous plugging and bibasilar atelectasis. Aortic Atherosclerosis (ICD10-I70.0) and Emphysema (ICD10-J43.9). Electronically Signed   By: Ulyses Southward M.D.   On: 05/26/2018 15:54   Dg Chest Port 1 View  Result Date: 05/26/2018 CLINICAL DATA:  Shortness of breath started last night EXAM: PORTABLE CHEST 1 VIEW COMPARISON:  04/12/2018 FINDINGS: The lungs are hyperinflated likely secondary to COPD. There is no focal consolidation. There is increased prominence of the interstitial markings at the lung bases. There is no pleural effusion or pneumothorax. The heart and mediastinal contours are unremarkable. The osseous structures are unremarkable. IMPRESSION: 1. No acute cardiopulmonary disease. 2. COPD. Electronically Signed   By: Elige Ko   On: 05/26/2018 12:59    Pending Labs Unresulted Labs (From admission, onward)    Start     Ordered   06/02/18 0500  Creatinine, serum  (enoxaparin (LOVENOX)    CrCl >/= 30 ml/min)  Weekly,   R    Comments:  while on enoxaparin therapy    05/26/18 1848   05/26/18 1845  CBC  (enoxaparin (LOVENOX)    CrCl >/= 30 ml/min)  Once,   R    Comments:  Baseline for  enoxaparin therapy IF NOT ALREADY DRAWN.  Notify MD if PLT < 100 K.    05/26/18 1848   05/26/18 1845  Creatinine, serum  (enoxaparin (LOVENOX)    CrCl >/= 30 ml/min)  Once,   R    Comments:  Baseline for enoxaparin therapy IF NOT ALREADY DRAWN.    05/26/18 1848   05/26/18 1212  CBC with Differential  ONCE - STAT,   STAT     05/26/18 1212   05/26/18 1212  Culture, blood (Routine x 2)  BLOOD CULTURE X 2,   STAT     05/26/18 1212   05/26/18 1212  Urinalysis, Routine w reflex microscopic  ONCE - STAT,   STAT     05/26/18 1212   05/26/18 1209  Blood Culture (routine x 2)  BLOOD CULTURE X 2,   STAT    Question:  Patient immune status  Answer:  Normal   05/26/18 1211          Vitals/Pain Today's Vitals   05/26/18 2001 05/26/18 2004 05/26/18 2015 05/26/18 2029  BP:    (!) 177/112  Pulse: (!) 120 (!) 108 (!) 105 (!) 110  Resp:    18  Temp:      TempSrc:      SpO2: 93% 94% 93% 94%  Weight:      Height:        Isolation Precautions No active isolations  Medications Medications  0.9 %  sodium chloride infusion (1,000 mLs Intravenous New Bag/Given 05/26/18 1235)  albuterol (VENTOLIN HFA) 108 (90 Base) MCG/ACT inhaler 10 puff (10 puffs Inhalation Given 05/26/18 1451)  sodium chloride (PF) 0.9 % injection (has no administration in time range)  albuterol (VENTOLIN HFA) 108 (90 Base) MCG/ACT inhaler 10 puff (has no administration in time range)  hydrochlorothiazide (HYDRODIURIL) tablet 12.5 mg (has no administration in time  range)  oxyCODONE-acetaminophen (PERCOCET) 10-325 MG per tablet 1 tablet (has no administration in time range)  rosuvastatin (CRESTOR) tablet 10 mg (has no administration in time range)  diazePAM SOLN 0.2 mg (has no administration in time range)  pantoprazole (PROTONIX) EC tablet 40 mg (has no administration in time range)  gabapentin (NEURONTIN) capsule 300 mg (has no administration in time range)  Vitamin D (Ergocalciferol) (DRISDOL) capsule 50,000 Units (has no  administration in time range)  ipratropium-albuterol (DUONEB) 0.5-2.5 (3) MG/3ML nebulizer solution 3 mL (has no administration in time range)  ipratropium-albuterol (DUONEB) 0.5-2.5 (3) MG/3ML nebulizer solution 3 mL (has no administration in time range)  methylPREDNISolone sodium succinate (SOLU-MEDROL) 125 mg/2 mL injection 60 mg (has no administration in time range)  insulin aspart (novoLOG) injection 0-15 Units (has no administration in time range)  enoxaparin (LOVENOX) injection 40 mg (has no administration in time range)  acetaminophen (TYLENOL) tablet 650 mg (has no administration in time range)    Or  acetaminophen (TYLENOL) suppository 650 mg (has no administration in time range)  thiamine (VITAMIN B-1) tablet 100 mg (has no administration in time range)  doxycycline (VIBRAMYCIN) 100 mg in sodium chloride 0.9 % 250 mL IVPB (has no administration in time range)  magic mouthwash (has no administration in time range)  linagliptin (TRADJENTA) tablet 5 mg (has no administration in time range)  oxyCODONE-acetaminophen (PERCOCET/ROXICET) 5-325 MG per tablet 2 tablet (2 tablets Oral Given 05/26/18 1238)  ondansetron (ZOFRAN) injection 4 mg (4 mg Intravenous Given 05/26/18 1238)  ipratropium (ATROVENT HFA) inhaler 4 puff (4 puffs Inhalation Given 05/26/18 1237)  magnesium sulfate IVPB 2 g 50 mL (0 g Intravenous Stopped 05/26/18 1340)  methylPREDNISolone sodium succinate (SOLU-MEDROL) 125 mg/2 mL injection 125 mg (125 mg Intravenous Given 05/26/18 1250)  ipratropium (ATROVENT HFA) inhaler 4 puff (4 puffs Inhalation Given 05/26/18 1456)  iohexol (OMNIPAQUE) 350 MG/ML injection 100 mL (100 mLs Intravenous Contrast Given 05/26/18 1517)    Mobility walks Low fall risk   Focused Assessments    R Recommendations: See Admitting Provider Note  Report given to:   Additional Notes:

## 2018-05-26 NOTE — ED Triage Notes (Signed)
EMS reports pt stated he started with increased SHOB last night, diaphoretic, Sats 60s on arrival up to 100 % on NRB, without 02 desats to 80s

## 2018-05-26 NOTE — ED Provider Notes (Signed)
[facility]-EMERGENCY DEPT Provider Note   CSN: [id number] Arrival date & time: 05/26/18  1139    History   Chief Complaint Chief Complaint  Patient presents with   Respiratory Distress    HPI Scott [name] is a 58 y.o. male.     Patient is a 57 year old male with a history of COPD, diabetes, GERD, chronic pain syndrome on Percocet 5 times a day who is presenting today with shortness of breath, nausea and vomiting.  Patient states for the last 6 to 7 days he has had URI symptoms with a dry cough and shortness of breath but symptoms got significantly worse yesterday.  He has been using his inhalers at home but has not gotten any better.  He has been having hot and cold chills as well as nausea and dry heaving over the last 2 days as well.  Patient states in addition to his upper respiratory symptoms he is also been out of his Percocet for the last 2 days because his pain management will not fill it until he sees someone at [facility] on the seventh.  He has been trying to take clonidine and Valium but it is not helping.  He denies any abdominal pain or diarrhea.  When EMS arrived today patient's oxygen saturation was 80% on room air and he was placed on a nonrebreather.  Oxygen saturation did improve to 93%.  He is unsure if he has been around anybody who has had COVID-like symptoms.  He does have a prior history of pneumonia and states that felt similar to this.  The history is provided by the patient.    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   COPD (chronic obstructive pulmonary disease) (HCC)    Diabetes mellitus without complication (HCC)    GERD (gastroesophageal reflux disease)    Headache(784.0)    Pneumonia    hx    Patient Active Problem List   Diagnosis Date Noted   Tachycardia 03/31/2018   Chronic pain syndrome 08/29/2017   Tobacco use disorder 05/13/2015   Sebaceous cyst  01/08/2014   Hypoxia 06/01/2013   Acute bronchitis 06/01/2013   Diabetes mellitus without complication (HCC)    GERD (gastroesophageal reflux disease)    Cervical post-laminectomy syndrome 10/02/2012   Degeneration of lumbar or lumbosacral intervertebral disc 06/01/2011   COPD (chronic obstructive pulmonary disease) (HCC) 05/30/2011   Hyperlipidemia 05/30/2011   Allergic rhinitis 05/30/2011   Cervical spondylosis with myelopathy 12/22/2010    Past Surgical History:  Procedure Laterality Date   ANTERIOR CERVICAL DECOMP/DISCECTOMY FUSION  12/22/2010   Procedure: ANTERIOR CERVICAL DECOMPRESSION/DISCECTOMY FUSION 3 LEVELS;  Surgeon: Cristi Loron;  Location: [facility];  Service: Neurosurgery;  Laterality: N/A;  Anterior cervical discectomy with fusion cervical three-four, four-five, and five sixCDF with Interbody Prosthesis, plating, and Bone Graft    ANTERIOR CERVICAL DECOMP/DISCECTOMY FUSION N/A 10/16/2012   Procedure: CERVICAL SIX-SEVEN ANTERIOR CERVICAL DECOMPRESSION/DISCECTOMY FUSION WITH INTERBODY PROTHESIS PLATING BONEGRAFT WITH /POSSIBLE HARDWARE REMOVAL OLD PLATE;  Surgeon: Cristi Loron, MD;  Location: [facility];  Service: Neurosurgery;  Laterality: N/A;   MULTIPLE TOOTH EXTRACTIONS     OTHER SURGICAL HISTORY     surgery on cheekbone and head for fall 2000   OTHER SURGICAL HISTORY     states when about 58yrs old he was urinating blood, 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 Authorizing Provider  Acetaminophen-Aspirin Buffered (EXCEDRIN BACK & BODY PO) Take 1 tablet by mouth daily as needed.    [provider]  albuterol (VENTOLIN HFA) 108 (90 Base) MCG/ACT inhaler Inhale 1-2 puffs into the lungs every 6 (six) hours as needed (wheezing or shortness of  breath). 05/02/18   Leslye PeerByrum, Robert S, MD  cloNIDine (CATAPRES) 0.1 MG tablet Take 1 tablet (0.1 mg total) by mouth 3 (three) times daily. 05/24/18   Myles LippsSantiago, Irma M, MD  COMBIVENT RESPIMAT 20-100 MCG/ACT AERS respimat INHALE 1 PUFF BY MOUTH INTO THE LUNGS FOUR TIMES A DAY 05/08/18   Byrum, Les Pouobert S, MD  cyclobenzaprine (FLEXERIL) 10 MG tablet TAKE 1 TABLET BY MOUTH 3 TIMES DAILY AS NEEDED FOR MUSCLE SPASMS 05/09/18   Myles LippsSantiago, Irma M, MD  diazePAM 5 MG/5ML SOLN Take 0.3 mLs (0.3 mg total) by mouth 2 (two) times daily for 2 days, THEN 0.2 mLs (0.2 mg total) 2 (two) times daily for 2 days, THEN 0.1 mLs (0.1 mg total) 2 (two) times daily for 2 days. 05/24/18 05/30/18  Myles LippsSantiago, Irma M, MD  fluconazole (DIFLUCAN) 100 MG tablet Take 1 tablet (100 mg total) by mouth daily. 04/18/18   Elvina SidleLauenstein, Kurt, MD  Fluticasone-Salmeterol (ADVAIR) 250-50 MCG/DOSE AEPB Inhale 1 puff into the lungs 2 (two) times daily. 04/08/18   Leslye PeerByrum, Robert S, MD  gabapentin (NEURONTIN) 300 MG capsule TAKE 1 CAPSULE BY MOUTH 3 TIMES DAILY 05/09/18   Myles LippsSantiago, Irma M, MD  hydrochlorothiazide (HYDRODIURIL) 12.5 MG tablet Take 1 tablet (12.5 mg total) by mouth daily. 10/25/17   Myles LippsSantiago, Irma M, MD  ipratropium-albuterol (DUONEB) 0.5-2.5 (3) MG/3ML SOLN INHALE THE CONTENTS OF 1 VIAL VIA NEBULIZER EVERY 6 HOURS AS DIRECTED FOR SHORTNESS OF BREATH OR WHEEZING 04/10/18   Byrum, Les Pouobert S, MD  JANUMET 50-500 MG tablet TAKE 1 TABLET BY MOUTH ONCE A DAY 05/09/18   Myles LippsSantiago, Irma M, MD  nicotine polacrilex (NICORETTE) 2 MG gum CHEW 1 PIECE BY MOUTH AS NEEDED FOR SMOKING CESSATION Patient taking differently: Take 2 mg by mouth as needed for smoking cessation.  03/13/18   Myles LippsSantiago, Irma M, MD  nystatin (MYCOSTATIN) 100000 UNIT/ML suspension Take 5 mLs (500,000 Units total) by mouth 4 (four) times daily. Swish and swallow 04/29/18   Myles LippsSantiago, Irma M, MD  omeprazole (PRILOSEC) 40 MG capsule TAKE 1 CAPSULE BY MOUTH EVERY DAY Patient taking differently: Take 40 mg by  mouth daily. TAKE 1 CAPSULE BY MOUTH EVERY DAY 10/25/17   Myles LippsSantiago, Irma M, MD  rosuvastatin (CRESTOR) 20 MG tablet Take 0.5 tablets (10 mg total) by mouth daily. 09/25/17   Myles LippsSantiago, Irma M, MD  Vitamin D, Ergocalciferol, (DRISDOL) 50000 units CAPS capsule Take 1 capsule (50,000 Units total) by mouth every 7 (seven) days. 10/27/17   Myles LippsSantiago, Irma M, MD    Family History Family History  Problem Relation Age of Onset   Emphysema Mother    COPD Mother    Asthma Brother    Diabetes Brother    Hyperlipidemia Brother    Hypertension Brother    Asthma Sister    Hyperlipidemia Sister    Hypertension Sister    Mental illness Sister    Heart disease Maternal Uncle    Hypertension  Brother     Social History Social History   Tobacco Use   Smoking status: Former Smoker    Packs/day: 0.25    Years: 41.00    Pack years: 10.25    Types: Cigarettes    Last attempt to quit: 01/21/2018    Years since quitting: 0.3   Smokeless tobacco: Never Used  Substance Use Topics   Alcohol use: Yes    Comment: Beer occasionally   Drug use: No     Allergies   Penicillins and Cymbalta [duloxetine hcl]   Review of Systems Review of Systems  All other systems reviewed and are negative.    Physical Exam Updated Vital Signs BP 109/69    Pulse (!) 107    Resp 17    Ht  (1.727 m)    Wt 97.5 kg    SpO2 95% Comment: Remains on 100% NRB   BMI 32.68 kg/m   Physical Exam Vitals signs and nursing note reviewed.  Constitutional:      General: He is not in acute distress.    Appearance: He is well-developed. He is diaphoretic.  HENT:     Head: Normocephalic and atraumatic.     Mouth/Throat:     Mouth: Mucous membranes are dry.  Eyes:     Conjunctiva/sclera: Conjunctivae normal.     Pupils: Pupils are equal, round, and reactive to light.  Neck:     Musculoskeletal: Normal range of motion and neck supple.  Cardiovascular:     Rate and Rhythm: Regular rhythm. Tachycardia  present.     Pulses: Normal pulses.     Heart sounds: No murmur.  Pulmonary:     Effort: Tachypnea and accessory muscle usage present. No respiratory distress.     Breath sounds: Wheezing and rhonchi present. No rales.     Comments: Diffuse expiratory rales with occasional rhonchi. Chest:     Chest wall: No tenderness.  Abdominal:     General: There is no distension.     Palpations: Abdomen is soft.     Tenderness: There is no abdominal tenderness. There is no guarding or rebound.  Musculoskeletal: Normal range of motion.        General: No tenderness.  Skin:    General: Skin is warm.     Findings: No erythema or rash.  Neurological:     Mental Status: He is alert and oriented to person, place, and time.  Psychiatric:        Mood and Affect: Mood normal.        Behavior: Behavior normal.        Thought Content: Thought content normal.      ED Treatments / Results  Labs (all labs ordered are listed, but only abnormal results are displayed) Labs Reviewed  COMPREHENSIVE METABOLIC PANEL - Abnormal; Notable for the following components:      Result Value   Glucose, Bld 125 (*)    Calcium 8.7 (*)    All other components within normal limits  D-DIMER, QUANTITATIVE (NOT AT [facility]) - Abnormal; Notable for the following components:   D-Dimer, Quant 0.60 (*)    All other components within normal limits  LACTATE DEHYDROGENASE - Abnormal; Notable for the following components:   LDH 286 (*)    All other components within normal limits  SARS CORONAVIRUS 2 (HOSPITAL ORDER, PERFORMED IN [facility])  CULTURE, BLOOD (ROUTINE X 2)  CULTURE, BLOOD (ROUTINE X 2)  CULTURE, BLOOD (ROUTINE X 2)  CULTURE,  BLOOD (ROUTINE X 2)  LACTIC ACID, PLASMA  PROCALCITONIN  FERRITIN  TRIGLYCERIDES  FIBRINOGEN  PROTIME-INR  CBC WITH DIFFERENTIAL/PLATELET  LACTIC ACID, PLASMA  C-REACTIVE PROTEIN  CBC WITH DIFFERENTIAL/PLATELET  URINALYSIS, ROUTINE W REFLEX MICROSCOPIC    EKG EKG  Interpretation  Date/Time:  Sunday May 26 2018 11:49:45 EDT Ventricular Rate:  125 PR Interval:    QRS Duration: 106 QT Interval:  399 QTC Calculation: 576 R Axis:   -18 Text Interpretation:  Sinus or ectopic atrial tachycardia Borderline left axis deviation Repol abnrm suggests ischemia, diffuse leads ST elevation, consider inferior injury Prolonged QT interval Baseline wander in lead(s) I III aVL No significant change since last tracing Confirmed by ,  ([id number]) on 05/26/2018 12:16:27 PM   Radiology Dg Chest Port 1 View  Result Date: 05/26/2018 CLINICAL DATA:  Shortness of breath started last night EXAM: PORTABLE CHEST 1 VIEW COMPARISON:  04/12/2018 FINDINGS: The lungs are hyperinflated likely secondary to COPD. There is no focal consolidation. There is increased prominence of the interstitial markings at the lung bases. There is no pleural effusion or pneumothorax. The heart and mediastinal contours are unremarkable. The osseous structures are unremarkable. IMPRESSION: 1. No acute cardiopulmonary disease. 2. COPD. Electronically Signed   By: Hetal  Patel   On: 05/26/2018 12:59    Procedures Procedures (including critical care time)  Medications Ordered in ED Medications  oxyCODONE-acetaminophen (PERCOCET/ROXICET) 5-325 MG per tablet 2 tablet (has no administration in time range)  0.9 %  sodium chloride infusion (has no administration in time range)  ondansetron (ZOFRAN) injection 4 mg (has no administration in time range)  albuterol (VENTOLIN HFA) 108 (90 Base) MCG/ACT inhaler 10 puff (has no administration in time range)  ipratropium (ATROVENT HFA) inhaler 4 puff (has no administration in time range)     Initial Impression / Assessment and Plan / ED Course  I have reviewed the triage vital signs and the nursing notes.  Pertinent labs & imaging results that were available during my care of the patient were reviewed by me and considered in my medical decision making (see  chart for details).       58  year old male presenting today with multiple complaints.  Patient does have history of COPD and has had URI symptoms for the last 1 week with worsening shortness of breath.  Patient is wheezing on exam diffusely but also has rhonchi.  Concern for possible COPD exacerbation versus COVID and pneumonia.  Also patient is actively withdrawing from opiates because he is waiting till his appointment on March 7 to get them filled.  He has had dry heaving, nausea and diaphoresis.  Patient given oral Percocet to help with the symptoms.  He was also given Zofran.  IV fluids were initiated at a continuous rate.  COVID labs and sepsis orders started.  Patient was given albuterol, Atrovent, Solu-Medrol and magnesium for concern for COPD exacerbation.  Patient is currently on a nonrebreather at 15 L and satting 93 to 96% but is continually tachypneic.  Labs and chest x-ray pending.  EKG shows persistent sinus tachycardia without acute changes.  2:44 PM After pain medication, IV fluids, Solu-Medrol, magnesium and inhaler patient is feeling better.  Wheezing has improved but is still present.  Patient was converted from a nonrebreather to 3 L nasal cannula and is currently satting 88 to 91%.  Lactic acid is within normal limits, CBC, CMP, pro calcitonin are all within normal limits.  Patient's COVID testing is negative.  Chest x-ray without acute  findings other than COPD.  Patient's d-dimer is elevated today at 0.60 and will do a CT to ensure no evidence of clot.  Patient continues to require oxygen and is unable to ambulate without hypoxia he will need to stay for COPD exacerbation and ongoing treatment.  Is elevated at 286 but the rest of his labs for COVID are negative.  CT PE pending  Final Clinical Impressions(s) / ED Diagnoses   Final diagnoses:  COPD exacerbation [facility])  Hypoxia    ED Discharge Orders    None       Gwyneth Sprout, MD 05/26/18 1446

## 2018-05-26 NOTE — ED Provider Notes (Signed)
Patient signed out to me by Dr. Anitra Lauth pending CT of chest results and those were negative for PE.  Patient has increasing oxygen requirement due to his COPD and will be admitted for COPD exacerbation.   Lorre Nick, MD 05/26/18 [phone]

## 2018-05-26 NOTE — H&P (Addendum)
History and Physical    DOA: 05/26/2018  PCP: Myles Lipps, MD  Patient coming from: home  Chief Complaint: sob and wheezing  HPI: Scott [name] is a 58 y.o. male with history h/o COPD, cervical spondylosis,post laminectomy,  chronic pain syndrome on chronic opiates,  GERD, hyperlipidemia, DM presents to the ED with complaints of shortness of breath preceded by URI symptoms for a week now. He has been using his inhalers at home but has not gotten any better. Denies any fevers or sick contacts (with COVID or otherwise). He denies fevers but reports feeling nauseous with dry heaving and diaphoretic. When EMS arrived today patient's oxygen saturation was 80% on room air and he was placed on a nonrebreather.  Oxygen saturation did improve to 93%.  He does have a prior history of pneumonia and states that felt similar to this. He was admitted here is March and d/ced within 24 hours. He states his pulmonologist prescribed him steroids/abx few days after d/c which caused him to have oral thrush and treated with nystatin swish and swallow by PCP.   Patient states in addition to his upper respiratory symptoms he is also been out of his Percocet for the last 2 days because his PCP would not refill until he follows up with [facility] pain management clinic (new referral) appointment on May 7. Patient states he attended his brothers funeral a week back and had 2 beers due to which he tested positive on alcohol breath test, brought to ED for evaluation and his PCP was notified after which he has been declined any new scripts. His Diazepam dosing (which he says he was taking for anxiety issues for 5 years) is being tapered too and he states he has been feeling very agitated and restless since then.   In the ED, patient received IV fluids, Solu-Medrol, magnesium and MDI therapy which improved his respiratory symptoms. He also received one dose of percocet as he was felt be withdrawing. Patient was converted from a  nonrebreather to 6 L nasal cannula  Lactic acid is within normal limits, CBC, CMP, pro calcitonin are all within normal limits.  Patient's rapid COVID testing is negative.  Chest x-ray without acute findings other than COPD.  Patient under went CT chest given elevated  d-dimer at 0.60 which is negative for PE/infiltrates but reported bronchictic changes with scattered areas mucous plugging/atelectasis. Patient is requested to be admitted for management of COPD exacerbation and opiate withdrawal.     Review of Systems: As per HPI otherwise 10 point review of systems negative.    Past Medical History:  Diagnosis Date  . 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 COPD (chronic obstructive pulmonary disease) (HCC)   . Diabetes mellitus without complication (HCC)   . GERD (gastroesophageal reflux disease)   . Headache(784.0)   . Pneumonia    hx    Past Surgical History:  Procedure Laterality Date  . ANTERIOR CERVICAL DECOMP/DISCECTOMY FUSION  12/22/2010   Procedure: ANTERIOR CERVICAL DECOMPRESSION/DISCECTOMY FUSION 3 LEVELS;  Surgeon: Cristi Loron;  Location: [facility];  Service: Neurosurgery;  Laterality: N/A;  Anterior cervical discectomy with fusion cervical three-four, four-five, and five sixCDF with Interbody Prosthesis, plating, and Bone Graft   . ANTERIOR CERVICAL DECOMP/DISCECTOMY FUSION N/A 10/16/2012   Procedure: CERVICAL SIX-SEVEN ANTERIOR CERVICAL DECOMPRESSION/DISCECTOMY FUSION WITH INTERBODY PROTHESIS PLATING BONEGRAFT WITH /POSSIBLE HARDWARE REMOVAL OLD PLATE;  Surgeon: Cristi Loron, MD;  Location: [facility];  Service: Neurosurgery;  Laterality: N/A;  . MULTIPLE TOOTH EXTRACTIONS    . OTHER SURGICAL HISTORY     surgery on cheekbone and head for fall 2000  . 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 SPINE SURGERY     2014 and 2016 - Dr Lovell SheehanJenkins  .  TONSILLECTOMY      Social history:  reports that he quit smoking about 4 months ago. His smoking use included cigarettes. He has a 10.25 pack-year smoking history. He has never used smokeless tobacco. He reports current alcohol use. He reports that he does not use drugs.   Allergies  Allergen Reactions  . Penicillins Anaphylaxis    Has patient had a PCN reaction causing immediate rash, facial/tongue/throat swelling, SOB or lightheadedness with hypotension: Yes Has patient had a PCN reaction causing severe rash involving mucus membranes or skin necrosis: Yes Has patient had a PCN reaction that required hospitalization: Yes Has patient had a PCN reaction occurring within the last 10 years: No If all of the above answers are "NO", then may proceed with Cephalosporin use.   Jannette Spanner. Cymbalta [Duloxetine Hcl]     "crazy thoughts"    Family History  Problem Relation Age of Onset  . Emphysema Mother   . COPD Mother   . Asthma Brother   . Diabetes Brother   . Hyperlipidemia Brother   . Hypertension Brother   . Asthma Sister   . Hyperlipidemia Sister   . Hypertension Sister   . Mental illness Sister   . Heart disease Maternal Uncle   . Hypertension Brother       Prior to Admission medications   Medication Sig Start Date End Date Taking? Authorizing Provider  Acetaminophen-Aspirin Buffered (EXCEDRIN BACK & BODY PO) Take 1 tablet by mouth daily as needed.    [provider]  albuterol (VENTOLIN HFA) 108 (90 Base) MCG/ACT inhaler Inhale 1-2 puffs into the lungs every 6 (six) hours as needed (wheezing or shortness of breath). 05/02/18   Leslye PeerByrum, Robert S, MD  cloNIDine (CATAPRES) 0.1 MG tablet Take 1 tablet (0.1 mg total) by mouth 3 (three) times daily. 05/24/18   Myles LippsSantiago, Irma M, MD  COMBIVENT RESPIMAT 20-100 MCG/ACT AERS respimat INHALE 1 PUFF BY MOUTH INTO THE LUNGS FOUR TIMES A DAY 05/08/18   Byrum, Les Pouobert S, MD  cyclobenzaprine (FLEXERIL) 10 MG tablet TAKE 1 TABLET BY MOUTH 3 TIMES  DAILY AS NEEDED FOR MUSCLE SPASMS 05/09/18   Myles LippsSantiago, Irma M, MD  diazePAM 5 MG/5ML SOLN Take 0.3 mLs (0.3 mg total) by mouth 2 (two) times daily for 2 days, THEN 0.2 mLs (0.2 mg total) 2 (two) times daily for 2 days, THEN 0.1 mLs (0.1 mg total) 2 (two) times daily for 2 days. 05/24/18 05/30/18  Myles LippsSantiago, Irma M, MD  fluconazole (DIFLUCAN) 100 MG tablet Take 1 tablet (100 mg total) by mouth daily. 04/18/18   Elvina SidleLauenstein, Kurt, MD  Fluticasone-Salmeterol (ADVAIR) 250-50 MCG/DOSE AEPB Inhale 1 puff into the lungs 2 (two) times daily. 04/08/18   Leslye PeerByrum, Robert S, MD  gabapentin (NEURONTIN) 300 MG capsule TAKE 1 CAPSULE BY MOUTH 3 TIMES DAILY 05/09/18   Myles LippsSantiago, Irma M, MD  hydrochlorothiazide (HYDRODIURIL) 12.5 MG tablet Take 1 tablet (12.5 mg total) by mouth daily. 10/25/17   Myles LippsSantiago, Irma M, MD  ipratropium-albuterol (DUONEB) 0.5-2.5 (3) MG/3ML SOLN INHALE THE CONTENTS OF  1 VIAL VIA NEBULIZER EVERY 6 HOURS AS DIRECTED FOR SHORTNESS OF BREATH OR WHEEZING 04/10/18   Byrum, Les Pou, MD  JANUMET 50-500 MG tablet TAKE 1 TABLET BY MOUTH ONCE A DAY 05/09/18   Myles Lipps, MD  nicotine polacrilex (NICORETTE) 2 MG gum CHEW 1 PIECE BY MOUTH AS NEEDED FOR SMOKING CESSATION Patient taking differently: Take 2 mg by mouth as needed for smoking cessation.  03/13/18   Myles Lipps, MD  nystatin (MYCOSTATIN) 100000 UNIT/ML suspension Take 5 mLs (500,000 Units total) by mouth 4 (four) times daily. Swish and swallow 04/29/18   Myles Lipps, MD  omeprazole (PRILOSEC) 40 MG capsule TAKE 1 CAPSULE BY MOUTH EVERY DAY Patient taking differently: Take 40 mg by mouth daily. TAKE 1 CAPSULE BY MOUTH EVERY DAY 10/25/17   Myles Lipps, MD  rosuvastatin (CRESTOR) 20 MG tablet Take 0.5 tablets (10 mg total) by mouth daily. 09/25/17   Myles Lipps, MD  Vitamin D, Ergocalciferol, (DRISDOL) 50000 units CAPS capsule Take 1 capsule (50,000 Units total) by mouth every 7 (seven) days. 10/27/17   Myles Lipps, MD    Physical Exam:  Vitals:   05/26/18 1330 05/26/18 1400 05/26/18 1430 05/26/18 1620  BP: 123/81 109/69 126/86 126/83  Pulse: (!) 103 (!) 107 99 (!) 109  Resp: Temp:      TempSrc:      SpO2: 93% 95% 95% 93%  Weight:      Height:        Constitutional: NAD, calm, comfortable Eyes: PERRL, lids and conjunctivae normal ENMT: Mucous membranes are moist. Posterior pharynx clear of any exudate or lesions.Normal dentition.  Neck: normal, supple, no masses, no thyromegaly Respiratory: scattered wheezing to auscultation bilaterally, no wheezing, no crackles. Normal respiratory effort. No accessory muscle use.  Cardiovascular: Regular rate and rhythm, no murmurs / rubs / gallops. No extremity edema. 2+ pedal pulses. No carotid bruits.  Abdomen: no tenderness, no masses palpated. No hepatosplenomegaly. Bowel sounds positive.  Musculoskeletal: no clubbing / cyanosis. No joint deformity upper and lower extremities. Good ROM, no contractures. Normal muscle tone.  Neurologic: CN 2-12 grossly intact. Sensation intact, DTR normal. Strength 5/5 in all 4.  Psychiatric: Normal judgment and insight. Alert and oriented x 3. Normal mood.  SKIN/catheters: no rashes, lesions, ulcers. No induration  Labs on Admission: I have personally reviewed following labs and imaging studies  CBC: Recent Labs  Lab 05/26/18 1215  WBC 7.4  NEUTROABS 5.3  HGB 14.3  HCT 46.1  MCV 94.7  PLT 217   Basic Metabolic Panel: Recent Labs  Lab 05/26/18 1209  NA 141  K 4.9  CL 107  CO2 26  GLUCOSE 125*  BUN 9  CREATININE 1.10  CALCIUM 8.7*   GFR: Estimated Creatinine Clearance: 83.8 mL/min (by C-G formula based on SCr of 1.1 mg/dL). Liver Function Tests: Recent Labs  Lab 05/26/18 1209  AST 31  ALT 17  ALKPHOS 73  BILITOT 1.2  PROT 6.9  ALBUMIN 3.8   No results for input(s): LIPASE, AMYLASE in the last 168 hours. No results for input(s): AMMONIA in the last 168 hours. Coagulation Profile: Recent Labs  Lab  05/26/18 1209  INR 0.9   Cardiac Enzymes: No results for input(s): CKTOTAL, CKMB, CKMBINDEX, TROPONINI in the last 168 hours. BNP (last 3 results) No results for input(s): PROBNP in the last 8760 hours. HbA1C: No results for input(s): HGBA1C in the last 72 hours. CBG: Recent  Labs  Lab 05/26/18 1506  GLUCAP 138*   Lipid Profile: Recent Labs    05/26/18 1209  TRIG 140   Thyroid Function Tests: No results for input(s): TSH, T4TOTAL, FREET4, T3FREE, THYROIDAB in the last 72 hours. Anemia Panel: Recent Labs    05/26/18 1209  FERRITIN 83   Urine analysis: No results found for: COLORURINE, APPEARANCEUR, LABSPEC, PHURINE, GLUCOSEU, HGBUR, BILIRUBINUR, KETONESUR, PROTEINUR, UROBILINOGEN, NITRITE, LEUKOCYTESUR  Radiological Exams on Admission: Ct Angio Chest Pe W And/or Wo Contrast  Result Date: 05/26/2018 CLINICAL DATA:  Increased shortness of breath last night, epigastric pain, elevated D-dimer question pulmonary embolism, history COPD, diabetes mellitus, former smoker EXAM: CT ANGIOGRAPHY CHEST WITH CONTRAST TECHNIQUE: Multidetector CT imaging of the chest was performed using the standard protocol during bolus administration of intravenous contrast. Multiplanar CT image reconstructions and MIPs were obtained to evaluate the vascular anatomy. CONTRAST:  OMNIPAQUE IOHEXOL 350 MG/ML SOLN IV COMPARISON:  None FINDINGS: Cardiovascular: Mild atherosclerotic calcification aorta. Aorta normal caliber without aneurysm. Heart upper normal size without pericardial effusion. Pulmonary arteries well opacified and patent. No evidence of pulmonary embolism. Mediastinum/Nodes: Esophagus unremarkable. Base of cervical region normal appearance. Scattered normal size mediastinal lymph nodes. No definite thoracic adenopathy. Lungs/Pleura: Emphysematous changes most severe at apices. Peribronchial thickening. Scattered areas of mucous plugging in the lower lobes. Bibasilar atelectasis. No definite acute  infiltrate, pleural effusion or pneumothorax. Upper Abdomen: Visualized upper abdomen unremarkable Musculoskeletal: Prior cervical spine fusion. No acute osseous abnormalities. Review of the MIP images confirms the above findings. IMPRESSION: No evidence of pulmonary embolism. Bronchitic changes with scattered areas of mucous plugging and bibasilar atelectasis. Aortic Atherosclerosis (ICD10-I70.0) and Emphysema (ICD10-J43.9). Electronically Signed   By: Ulyses Southward M.D.   On: 05/26/2018 15:54   Dg Chest Port 1 View  Result Date: 05/26/2018 CLINICAL DATA:  Shortness of breath started last night EXAM: PORTABLE CHEST 1 VIEW COMPARISON:  04/12/2018 FINDINGS: The lungs are hyperinflated likely secondary to COPD. There is no focal consolidation. There is increased prominence of the interstitial markings at the lung bases. There is no pleural effusion or pneumothorax. The heart and mediastinal contours are unremarkable. The osseous structures are unremarkable. IMPRESSION: 1. No acute cardiopulmonary disease. 2. COPD. Electronically Signed   By: Elige Ko   On: 05/26/2018 12:59    EKG: Independently reviewed. Poor quality with lot of artifact, Sinus tachycardia , no significant ST-T changes noted. Qtc prolonged at 576 msec     Assessment and Plan:   1. Acute hypoxic respiratory failure/ COPD exacerbation with acute bronchitis: CT findings noted. Admit with IV steroids/empiric antibiotics/mucolytics/neb treatments. COVID negative. Taper 02 as tolerated. CT chest -ve for PE  2. Nausea/diaphoresis:  Likely related opiate/benzo withdrawal. Counseled regarding compliance with pain management agreement. Will need to contact PCP/pain clinic in am to report presentation and coordinate care. On clonidine at baseline which should have helped opiate withdrawal.Will resume Diazepam at the current tapering dose by PCP and percocet at 10/325mg  Q6hours prn.   3.  DM 2: On Janumet at baseline. Diabetic diet/SSI  /Linagliptin while here. May need additional coverage while on steroids.  4. Abnormal EKG/prolonged qtc: Repeat EKG in am when chills/shaking resolved. He received IV mag today. Avoid qt prolonging agents  5. Cervical spondylosis/chronic pain syndrome: on neurontin/flexeril. Noted to have been tapered off diazepam recently and plan for opiates as above.   6. HTN; On clonidine , HCTZ at baseline. Will hold clonidine given borderline BP  7. GERD:PPI  8. Hyperlipidemia:Crestor  9. Recent oral thrush: will treat with magic mouthwash while on steroids here  10. Polysubstance abuse: Patient admits to recent alcohol use when his brother passed away but denies chronic dependence. Admits to marijuana use. Will hold off on CIWA protocol due to patient's tendency to abuse benzos. May initiate if strong suspicion for alcohol withdrawal. Thiamine po.   DVT prophylaxis: Lovenox  Code Status: Full code  Family Communication: Discussed with patient. Health care proxy would be his wife Consults called: None Admission status:  I certify that at the point of admission it is my clinical judgment that the patient will require inpatient hospital care spanning beyond 2 midnights from the point of admission due to high intensity of service and high frequency of surveillance required.Inpatient status is judged to be reasonable and necessary in order to provide the required intensity of service to ensure the patient's safety. The patient's presenting symptoms, physical exam findings, and initial radiographic and laboratory data in the context of their chronic comorbidities is felt to place them at high risk for further clinical deterioration. The following factors support the patient status of inpatient: Acute respiratory failure requiring supplemental o2 /IV steroids   Ajay Strubel MAlessandra Bevelstalists Pager [phone]  If 7PM-7AM, please contact night-coverage [email or web address] Password [facility]  05/26/2018,  5:28 PM

## 2018-05-27 ENCOUNTER — Telehealth: Payer: Self-pay | Admitting: Family Medicine

## 2018-05-27 ENCOUNTER — Other Ambulatory Visit: Payer: Self-pay

## 2018-05-27 DIAGNOSIS — K219 Gastro-esophageal reflux disease without esophagitis: Secondary | ICD-10-CM

## 2018-05-27 DIAGNOSIS — M961 Postlaminectomy syndrome, not elsewhere classified: Secondary | ICD-10-CM

## 2018-05-27 DIAGNOSIS — E785 Hyperlipidemia, unspecified: Secondary | ICD-10-CM

## 2018-05-27 DIAGNOSIS — F17200 Nicotine dependence, unspecified, uncomplicated: Secondary | ICD-10-CM

## 2018-05-27 DIAGNOSIS — J441 Chronic obstructive pulmonary disease with (acute) exacerbation: Principal | ICD-10-CM

## 2018-05-27 DIAGNOSIS — R110 Nausea: Secondary | ICD-10-CM

## 2018-05-27 LAB — BLOOD CULTURE ID PANEL (REFLEXED)

## 2018-05-27 LAB — GLUCOSE, CAPILLARY
Glucose-Capillary: 118 mg/dL — ABNORMAL HIGH (ref 70–99)
Glucose-Capillary: 179 mg/dL — ABNORMAL HIGH (ref 70–99)
Glucose-Capillary: 190 mg/dL — ABNORMAL HIGH (ref 70–99)
Glucose-Capillary: 162 mg/dL — ABNORMAL HIGH (ref 70–99)

## 2018-05-27 MED ORDER — METHYLPREDNISOLONE SODIUM SUCC 125 MG IJ SOLR
60.0000 mg | Freq: Three times a day (TID) | INTRAMUSCULAR | Status: DC
  Administered 2018-05-27 – 2018-05-28 (×4): 60 mg via INTRAVENOUS
  Filled 2018-05-27 (×4): qty 2

## 2018-05-27 MED ORDER — IPRATROPIUM-ALBUTEROL 0.5-2.5 (3) MG/3ML IN SOLN
3.0000 mL | Freq: Four times a day (QID) | RESPIRATORY_TRACT | Status: DC
  Administered 2018-05-28 (×2): 3 mL via RESPIRATORY_TRACT
  Filled 2018-05-27 (×2): qty 3

## 2018-05-27 MED ORDER — OXYCODONE-ACETAMINOPHEN 5-325 MG PO TABS
1.0000 | ORAL_TABLET | Freq: Every day | ORAL | Status: DC | PRN
  Administered 2018-05-27 – 2018-05-28 (×4): 1 via ORAL
  Filled 2018-05-27 (×4): qty 1

## 2018-05-27 MED ORDER — OXYCODONE-ACETAMINOPHEN 5-325 MG PO TABS
1.0000 | ORAL_TABLET | Freq: Every day | ORAL | Status: DC | PRN
  Administered 2018-05-27: 1 via ORAL
  Filled 2018-05-27: qty 1

## 2018-05-27 MED ORDER — NICOTINE 21 MG/24HR TD PT24
21.0000 mg | MEDICATED_PATCH | Freq: Every day | TRANSDERMAL | Status: DC
  Administered 2018-05-27 – 2018-05-28 (×2): 21 mg via TRANSDERMAL
  Filled 2018-05-27 (×2): qty 1

## 2018-05-27 MED ORDER — METHYLPREDNISOLONE SODIUM SUCC 125 MG IJ SOLR: 60.0000 mg | Freq: Two times a day (BID) | INTRAMUSCULAR | Status: DC

## 2018-05-27 MED ORDER — OXYCODONE HCL 5 MG PO TABS
5.0000 mg | ORAL_TABLET | Freq: Four times a day (QID) | ORAL | Status: DC | PRN
  Administered 2018-05-27 – 2018-05-28 (×5): 5 mg via ORAL
  Filled 2018-05-27 (×5): qty 1

## 2018-05-27 NOTE — Telephone Encounter (Signed)
Copied from CRM [id number]. Topic: General - Other >> May 27, 2018  7:54 AM Scott [name] wrote: Reason for CRM: pt called and stated that he would like Dr Leretha Pol that he is in the hospital. Please advise

## 2018-05-27 NOTE — Progress Notes (Signed)
[facility] PROGRESS NOTE  Schawn Tanksley TKP:[id number] DOB: Jul 31, 1960 DOA: 05/26/2018 PCP: Myles Lipps, MD  Assessment/Plan:  Acute hypoxic respiratory failure secondary to COPD exacerbation Initially required non rebreather mask and currently requiring 3 L O2 Staves(at baseline only required oxygen at night) secondary to COPD (more productive cough, worsening shortness of breath).  CTA/chest x-ray shows no acute pulmonology pathology otherwise.  Patient's COVID to testing negative.  Continue IV Solu-Medrol every 8 given patient still requiring O2 with diminished breath sounds and wheezing.  Scheduled inhaler therapy (duo nebs 4 times daily), continue doxycycline for anti-inflammatory support in setting of COPD exacerbation..  Add incentive spirometer flutter valve for mucus clearance, wean oxygen as able.  Continue to monitor closely.  Nausea and diaphoresis, resolved. Presumably due to opioid/benzodiazepine withdrawal.  Not having any abdominal pain, tolerating diet.  EKG showed some TWI in lateral leads with no significant change from prior EKGs in system. No CP. Patient admits to not taking clonidine as he had no plans to taper off his opioids.  We are continuing his diazepam taper dose as recommended by his PCP.  Patient will follow with outpatient pain clinic appointment this week.  Chronic pain secondary to cervical spondylosis Clonidine was prescribed to assist with outpatient taper however patient has not been tapering and has outpatient appointment with pain clinic this upcoming week (Thursday).  During hospital stay will hold off on clonidine and continue Percocet 10 mg 5 times daily as needed (not to exceed more than every 4 hours as needed severe pain).  Continue Neurontin  Abnormal EKG/prolonged QTC.  EKG x 2 here showed some TWI in lateral leads with no significant change from prior EKGs in system. No CP here. Closely monitor if any CP will consult cardiology  Recent oral  thrush.  Likely secondary to outpatient steroid regimen.  Since patient will be on IV Solu-Medrol here we will continue Magic mouthwash.  HTN, currently at goal We will continue home HCTZ, add IV hydralazine for as needed support.  Hyperlipidemia, stable Continue home Crestor.  Current tobacco abuse Added nicotine patch per patient request  GERD, stable Continue home PPI  Type 2 diabetes, controlled (A1c 6.1, 12/22) Hold oral Janumet while in hospital, monitor sliding scale.,  Monitor CBGs  Chronic pain. Diazepam   Code Status: Full Code Family Communication: no family present (indicate person spoken with, relationship, and if by phone, the number) Disposition Plan: Continue IV steroids for COPD exacerbation, closely monitor for any chest pain development, wean oxygen as able, anticipate discharge next 24 to 48 hours   Consultants:  None  Procedures:  None  Antibiotics:  Doxycycline, 5/3 (indicate start date, and stop date if known)  HPI/Subjective:  Scott [name] is a 58 y.o. year old male with medical history significant for substance abuse- history of accident overdose of opioids requiring narcan ( 05/15/18) Chronic pain on opiates, COPD, HTN, T2DM who presented on 05/26/2018 with shortness of breath preceded by subjective chills, headaches that did not improve with use of his inhalers at home as well as with seem like anxiety attacks to him and was found to have acute hypoxic respiratory failure secondary to COPD exacerbation.  In the ED he was afebrile, oxygen saturation of 87% on 6 L oxygen nasal cannula then 100% NRB, tachycardic to 108, with normotensive blood pressures  CBC unremarkable.  Blood cultures x2 were obtained and are currently pending.  SARS-CoV-2 19 test was obtained and found to be negative lactic acid unremarkable BMP  also unremarkable.  D-dimer elevated at 0.60, procalcitonin less than 0.10, LDH 286, ferritin 83, triglycerides 140, fibrinogen 433, CRP  less than 0.8 INR 0.9.  CTA chest obtained due to elevated d-dimer showed no evidence of PE, bronchitic changes with scattered mucus plugging and bibasilar atelectasis.  Chest x-ray showed no acute cardiopulmonary disease.  Patient was given 1 L normal saline, oxycodone/acetaminophen, IV magnesium sulfate, IV Solu-Medrol 125 mg, Atrovent and albuterol nebs.  [facility] was called for admission. Objective: Vitals:   05/26/18 2151 05/27/18 0453  BP:  128/74  Pulse:  98  Resp:  18  Temp:  98.2 F (36.8 C)  SpO2: 92% 90%    Intake/Output Summary (Last 24 hours) at 05/27/2018 0546 Last data filed at 05/26/2018 1340 Gross per 24 hour  Intake 50 ml  Output -  Net 50 ml   Filed Weights   05/26/18 1148 05/26/18 2110  Weight: 97.5 kg 98.9 kg    Exam:   General: Obese male, sitting in bed in no distress  Cardiovascular: Regular rate and rhythm, no appreciable murmurs rubs or gallops, no edema  Respiratory: Increased respiratory effort on 3 L, dyspneic while talking, no accessory muscle use, diminished breath sounds throughout all lung fields, and occasional wheezing  Abdomen: Soft, nondistended, nontender, normal bowel sounds  Musculoskeletal: Normal range of motion  Skin no rashes or lesions  Neurologic alert and oriented x4, no appreciable focal deficits  Data Reviewed: Basic Metabolic Panel: Recent Labs  Lab 05/26/18 1209  NA 141  K 4.9  CL 107  CO2 26  GLUCOSE 125*  BUN 9  CREATININE 1.10  CALCIUM 8.7*   Liver Function Tests: Recent Labs  Lab 05/26/18 1209  AST 31  ALT 17  ALKPHOS 73  BILITOT 1.2  PROT 6.9  ALBUMIN 3.8   No results for input(s): LIPASE, AMYLASE in the last 168 hours. No results for input(s): AMMONIA in the last 168 hours. CBC: Recent Labs  Lab 05/26/18 1215  WBC 7.4  NEUTROABS 5.3  HGB 14.3  HCT 46.1  MCV 94.7  PLT 217   Cardiac Enzymes: No results for input(s): CKTOTAL, CKMB, CKMBINDEX, TROPONINI in the last 168  hours. BNP (last 3 results) Recent Labs    04/05/18 0510  BNP 609.6*    ProBNP (last 3 results) No results for input(s): PROBNP in the last 8760 hours.  CBG: Recent Labs  Lab 05/26/18 1506 05/26/18 2123  GLUCAP 138* 167*    Recent Results (from the past 240 hour(s))  SARS Coronavirus 2 [facility] order, Performed in [facility] hospital lab)     Status: None   Collection Time: 05/26/18 12:09 PM  Result Value Ref Range Status   SARS Coronavirus 2 NEGATIVE NEGATIVE Final    Comment: (NOTE) If result is NEGATIVE SARS-CoV-2 target nucleic acids are NOT DETECTED. The SARS-CoV-2 RNA is generally detectable in upper and lower  respiratory specimens during the acute phase of infection. The lowest  concentration of SARS-CoV-2 viral copies this assay can detect is 250  copies / mL. A negative result does not preclude SARS-CoV-2 infection  and should not be used as the sole basis for treatment or other  patient management decisions.  A negative result may occur with  improper specimen collection / handling, submission of specimen other  than nasopharyngeal swab, presence of viral mutation(s) within the  areas targeted by this assay, and inadequate number of viral copies  (<250 copies / mL). A negative result must be combined  with clinical  observations, patient history, and epidemiological information. If result is POSITIVE SARS-CoV-2 target nucleic acids are DETECTED. The SARS-CoV-2 RNA is generally detectable in upper and lower  respiratory specimens dur ing the acute phase of infection.  Positive  results are indicative of active infection with SARS-CoV-2.  Clinical  correlation with patient history and other diagnostic information is  necessary to determine patient infection status.  Positive results do  not rule out bacterial infection or co-infection with other viruses. If result is PRESUMPTIVE POSTIVE SARS-CoV-2 nucleic acids MAY BE PRESENT.   A presumptive positive result  was obtained on the submitted specimen  and confirmed on repeat testing.  While 2019 novel coronavirus  (SARS-CoV-2) nucleic acids may be present in the submitted sample  additional confirmatory testing may be necessary for epidemiological  and / or clinical management purposes  to differentiate between  SARS-CoV-2 and other Sarbecovirus currently known to infect humans.  If clinically indicated additional testing with an alternate test  methodology [phone]([id number]) is advised. The SARS-CoV-2 RNA is generally  detectable in upper and lower respiratory sp ecimens during the acute  phase of infection. The expected result is Negative. Fact Sheet for Patients:  BoilerBrush.com.cyhttps://www.fda.gov/media/136312/download Fact Sheet for Healthcare Providers: https://pope.com/https://www.fda.gov/media/136313/download This test is not yet approved or cleared by the [place] FDA and has been authorized for detection and/or diagnosis of SARS-CoV-2 by FDA under an Emergency Use Authorization (EUA).  This EUA will remain in effect (meaning this test can be used) for the duration of the COVID-19 declaration under Section 564(b)(1) of the Act, 21 U.S.C. section 360bbb-3(b)(1), unless the authorization is terminated or revoked sooner. Performed at [facility], [address] [place], [place] [address]      Studies: Ct Angio Chest Pe W And/or Wo Contrast  Result Date: 05/26/2018 CLINICAL DATA:  Increased shortness of breath last night, epigastric pain, elevated D-dimer question pulmonary embolism, history COPD, diabetes mellitus, former smoker EXAM: CT ANGIOGRAPHY CHEST WITH CONTRAST TECHNIQUE: Multidetector CT imaging of the chest was performed using the standard protocol during bolus administration of intravenous contrast. Multiplanar CT image reconstructions and MIPs were obtained to evaluate the vascular anatomy. CONTRAST:  100mL OMNIPAQUE IOHEXOL 350 MG/ML SOLN IV COMPARISON:  None FINDINGS: Cardiovascular: Mild  atherosclerotic calcification aorta. Aorta normal caliber without aneurysm. Heart upper normal size without pericardial effusion. Pulmonary arteries well opacified and patent. No evidence of pulmonary embolism. Mediastinum/Nodes: Esophagus unremarkable. Base of cervical region normal appearance. Scattered normal size mediastinal lymph nodes. No definite thoracic adenopathy. Lungs/Pleura: Emphysematous changes most severe at apices. Peribronchial thickening. Scattered areas of mucous plugging in the lower lobes. Bibasilar atelectasis. No definite acute infiltrate, pleural effusion or pneumothorax. Upper Abdomen: Visualized upper abdomen unremarkable Musculoskeletal: Prior cervical spine fusion. No acute osseous abnormalities. Review of the MIP images confirms the above findings. IMPRESSION: No evidence of pulmonary embolism. Bronchitic changes with scattered areas of mucous plugging and bibasilar atelectasis. Aortic Atherosclerosis (ICD10-I70.0) and Emphysema (ICD10-J43.9). Electronically Signed   By: Ulyses SouthwardMark  Boles M.D.   On: 05/26/2018 15:54   Dg Chest Port 1 View  Result Date: 05/26/2018 CLINICAL DATA:  Shortness of breath started last night EXAM: PORTABLE CHEST 1 VIEW COMPARISON:  04/12/2018 FINDINGS: The lungs are hyperinflated likely secondary to COPD. There is no focal consolidation. There is increased prominence of the interstitial markings at the lung bases. There is no pleural effusion or pneumothorax. The heart and mediastinal contours are unremarkable. The osseous structures are unremarkable. IMPRESSION: 1. No acute  cardiopulmonary disease. 2. COPD. Electronically Signed   By: Elige Ko   On: 05/26/2018 12:59    Scheduled Meds: . diazePAM  0.2 mg Oral BID  . enoxaparin (LOVENOX) injection  40 mg Subcutaneous Q24H  . gabapentin  300 mg Oral TID  . hydrochlorothiazide  12.5 mg Oral Daily  . insulin aspart  0-15 Units Subcutaneous TID WC  . ipratropium-albuterol  3 mL Inhalation QID  .  linagliptin  5 mg Oral Daily  . magic mouthwash  5 mL Oral TID  . methylPREDNISolone (SOLU-MEDROL) injection  60 mg Intravenous Q6H  . pantoprazole  40 mg Oral Daily  . rosuvastatin  10 mg Oral Daily  . thiamine  100 mg Oral Daily  . [START ON 06/02/2018] Vitamin D (Ergocalciferol)  50,000 Units Oral Q7 days   Continuous Infusions: . sodium chloride 1,000 mL (05/26/18 2235)  . doxycycline (VIBRAMYCIN) IV 100 mg (05/26/18 2255)    Active Problems:   Acute respiratory failure (HCC)      Laverna Peace  [facility]

## 2018-05-27 NOTE — Progress Notes (Signed)
Assumed care of patient at this time. Agreed with nurse assessment and will cont to monitor.  

## 2018-05-27 NOTE — Telephone Encounter (Signed)
Noted, admission H+P reviewed

## 2018-05-27 NOTE — Progress Notes (Signed)
PHARMACY - PHYSICIAN COMMUNICATION CRITICAL VALUE ALERT - BLOOD CULTURE IDENTIFICATION (BCID)  Scott [name] is an 58 y.o. male who presented to [facility] on 05/26/2018 with a chief complaint of shortness of breath.   Assessment:  Admit for treatment of COPD exacerbation.  Expect current BCx is contaminant.   Name of physician (or Provider) Contacted: S Nettey  Current antibiotics: Doxycycline 100mg  BID  Changes to prescribed antibiotics recommended:  Patient is on recommended antibiotics - No changes needed.  Results for orders placed or performed during the hospital encounter of 05/26/18  Blood Culture ID Panel (Reflexed) (Collected: 05/26/2018 12:12 PM)  Result Value Ref Range   Enterococcus species NOT DETECTED NOT DETECTED   Listeria monocytogenes NOT DETECTED NOT DETECTED   Staphylococcus species DETECTED (A) NOT DETECTED   Staphylococcus aureus (BCID) NOT DETECTED NOT DETECTED   Methicillin resistance NOT DETECTED NOT DETECTED   Streptococcus species NOT DETECTED NOT DETECTED   Streptococcus agalactiae NOT DETECTED NOT DETECTED   Streptococcus pneumoniae NOT DETECTED NOT DETECTED   Streptococcus pyogenes NOT DETECTED NOT DETECTED   Acinetobacter baumannii NOT DETECTED NOT DETECTED   Enterobacteriaceae species NOT DETECTED NOT DETECTED   Enterobacter cloacae complex NOT DETECTED NOT DETECTED   Escherichia coli NOT DETECTED NOT DETECTED   Klebsiella oxytoca NOT DETECTED NOT DETECTED   Klebsiella pneumoniae NOT DETECTED NOT DETECTED   Proteus species NOT DETECTED NOT DETECTED   Serratia marcescens NOT DETECTED NOT DETECTED   Haemophilus influenzae NOT DETECTED NOT DETECTED   Neisseria meningitidis NOT DETECTED NOT DETECTED   Pseudomonas aeruginosa NOT DETECTED NOT DETECTED   Candida albicans NOT DETECTED NOT DETECTED   Candida glabrata NOT DETECTED NOT DETECTED   Candida krusei NOT DETECTED NOT DETECTED   Candida parapsilosis NOT DETECTED NOT DETECTED   Candida tropicalis  NOT DETECTED NOT DETECTED    Elson Clan 05/27/2018  4:32 PM

## 2018-05-28 LAB — BASIC METABOLIC PANEL
Anion gap: 7 (ref 5–15)
BUN: 21 mg/dL — ABNORMAL HIGH (ref 6–20)
CO2: 29 mmol/L (ref 22–32)
Calcium: 9.2 mg/dL (ref 8.9–10.3)
Chloride: 104 mmol/L (ref 98–111)
Creatinine, Ser: 0.94 mg/dL (ref 0.61–1.24)
GFR calc Af Amer: >60 mL/min (ref >60)
GFR calc non Af Amer: >60 mL/min (ref >60)
Glucose, Bld: 154 mg/dL — ABNORMAL HIGH (ref 70–99)
Potassium: 4.6 mmol/L (ref 3.5–5.1)
Sodium: 140 mmol/L (ref 135–145)

## 2018-05-28 LAB — GLUCOSE, CAPILLARY
Glucose-Capillary: 150 mg/dL — ABNORMAL HIGH (ref 70–99)
Glucose-Capillary: 182 mg/dL — ABNORMAL HIGH (ref 70–99)

## 2018-05-28 LAB — CBC
HCT: 43.8 % (ref 39.0–52.0)
Hemoglobin: 13.9 g/dL (ref 13.0–17.0)
MCH: 30.2 pg (ref 26.0–34.0)
MCHC: 31.7 g/dL (ref 30.0–36.0)
MCV: 95 fL (ref 80.0–100.0)
Platelets: 225 10*3/uL (ref 150–400)
RBC: 4.61 MIL/uL (ref 4.22–5.81)
RDW: 15.4 % (ref 11.5–15.5)
WBC: 15.3 10*3/uL — ABNORMAL HIGH (ref 4.0–10.5)
nRBC: 0 % (ref 0.0–0.2)

## 2018-05-28 MED ORDER — PREDNISONE 10 MG PO TABS: ORAL_TABLET | ORAL | 0 refills | Status: AC

## 2018-05-28 MED ORDER — OXYCODONE-ACETAMINOPHEN 10-325 MG PO TABS: 1.0000 | ORAL_TABLET | ORAL | 0 refills | Status: DC

## 2018-05-28 MED ORDER — DOXYCYCLINE HYCLATE 100 MG PO CAPS: 200.0000 mg | ORAL_CAPSULE | Freq: Every day | ORAL | 0 refills | Status: AC

## 2018-05-28 NOTE — Discharge Summary (Signed)
Discharge Summary  Scott [name] ZOX:[id number] DOB: [birth year]  PCP: Myles Lipps, MD  Admit date: 05/26/2018 Discharge date: 05/28/2018   Time spent: < 25 minutes  Admitted From: home Disposition:  home  Recommendations for Outpatient Follow-up:  1. Follow up with PCP in 1 week 2. Prednisone taper x 4 days, doxycycline x 5 days 3.     Discharge Diagnoses:  Active Hospital Problems   Diagnosis Date Noted   Nausea 05/27/2018   Acute respiratory failure with hypoxia (HCC) 05/26/2018   COPD with acute exacerbation (HCC) 04/04/2018   Tobacco use disorder 05/13/2015   Diabetes mellitus without complication (HCC)    GERD (gastroesophageal reflux disease)    Cervical post-laminectomy syndrome 10/02/2012   Hyperlipidemia 05/30/2011    Resolved Hospital Problems  No resolved problems to display.    Discharge Condition: Stable   CODE STATUS:FULL   History of present illness:  Scott [name] is a 58 y.o. year old male with medical history significant for substance abuse- history of accident overdose of opioids requiring narcan ( 05/15/18) Chronic pain on opiates, COPD, HTN, T2DM who presented on 05/26/2018 with shortness of breath preceded by subjective chills, headaches that did not improve with use of his inhalers at home as well as with seem like anxiety attacks to him and was found to have acute hypoxic respiratory failure secondary to COPD exacerbation.  In the ED he was afebrile, oxygen saturation of 87% on 6 L oxygen nasal cannula then 100% NRB, tachycardic to 108, with normotensive blood pressures  CBC unremarkable.  Blood cultures x2 were obtained and are currently pending.  SARS-CoV-2 19 test was obtained and found to be negative lactic acid unremarkable BMP also unremarkable.  D-dimer elevated at 0.60, procalcitonin less than 0.10, LDH 286, ferritin 83, triglycerides 140, fibrinogen 433, CRP less than 0.8 INR 0.9.  CTA chest obtained due to elevated d-dimer  showed no evidence of PE, bronchitic changes with scattered mucus plugging and bibasilar atelectasis.  Chest x-ray showed no acute cardiopulmonary disease.  Patient was given 1 L normal saline, oxycodone/acetaminophen, IV magnesium sulfate, IV Solu-Medrol 125 mg, Atrovent and albuterol nebs.  [facility] was called for admission.   Remaining hospital course addressed in problem based format below:   Hospital Course:   Acute hypoxic respiratory failure secondary to COPD exacerbation Initially required non rebreather mask and eventually 3 L [place] secondary to COPD (more productive cough, worsening shortness of breath). He was able to wean back to room air( only uses nightly oxygen) with IV steroids, doxycycline for antiinflammatory component, and scheduled inhalers regimen.  He was able to ambulate without requiring o2 prior to discharge.  currently requiring 3 L O2 [place](at baseline only required oxygen at night)  CTA/chest x-ray shows no acute pulmonology pathology otherwise.  Patient's COVID testing negative.Discharged on prednisone taper x 4 days, encourage ncentive spirometer flutter valve for mucus clearance,close PCP follow up   Nausea and diaphoresis, resolved. Presumably due to opioid/benzodiazepine withdrawal.  Not having any abdominal pain, tolerating diet.  EKG showed some TWI in lateral leads with no significant change from prior EKGs in system. No CP. Patient admits to not taking clonidine as he had no plans to taper off his opioids.  His diazepam taper dose as recommended by his PCP was continued in the hospital.  Patient will follow with outpatient pain clinic appointment this week.  Chronic pain secondary to cervical spondylosis Clonidine was prescribed to assist with outpatient taper however patient has not been tapering  and has outpatient appointment with pain clinic this upcoming week (Thursday).  During hospital stay did not continue clonidine in hospital. Short script of Percocet 10  mg q4H as needed provided on discharge until pain clinic appointment this week. Resume home Neurontin  Abnormal EKG/prolonged QTC.  EKG x 2 here showed some TWI in lateral leads with no significant change from prior EKGs in system. No CP here.   Recent oral thrush.  Likely secondary to outpatient steroid regimen.  refilled on discharge given will be on prednisone taper  HTN, currently at goal Continue home HCTZ,   Hyperlipidemia, stable Continue home Crestor.  Current tobacco abuse Added nicotine patch per patient request  GERD, stable Continue home PPI  Type 2 diabetes, controlled (A1c 6.1, 12/22) Held Janumet while in hospital, resume on discharge Chronic pain. Diazepam     Consultations:  none  Procedures/Studies: none  Discharge Exam: BP (!) 136/93 (BP Location: Right Arm)    Pulse (!) 117    Temp 98.1 F (36.7 C) (Oral)    Resp 18    Ht 5' 8.5" (1.74 m)    Wt 98.9 kg    SpO2 91%    BMI 32.68 kg/m    General: Obese male, sitting in bed in no distress  Cardiovascular: Regular rate and rhythm, no appreciable murmurs rubs or gallops, no edema  Respiratory:  Normal effort on room air, no wheezing  Abdomen: Soft, nondistended, nontender, normal bowel sounds  Musculoskeletal: Normal range of motion  Skin no rashes or lesions  Neurologic alert and oriented x4, no appreciable focal deficits   Discharge Instructions You were cared for by a hospitalist during your hospital stay. If you have any questions about your discharge medications or the care you received while you were in the hospital after you are discharged, you can call the unit and asked to speak with the hospitalist on call if the hospitalist that took care of you is not available. Once you are discharged, your primary care physician will handle any further medical issues. Please note that NO REFILLS for any discharge medications will be authorized once you are discharged, as it is imperative that  you return to your primary care physician (or establish a relationship with a primary care physician if you do not have one) for your aftercare needs so that they can reassess your need for medications and monitor your lab values.  Discharge Instructions    Diet - low sodium heart healthy   Complete by:  As directed    Increase activity slowly   Complete by:  As directed      Allergies as of 05/28/2018      Reactions   Penicillins Anaphylaxis   Has patient had a PCN reaction causing immediate rash, facial/tongue/throat swelling, SOB or lightheadedness with hypotension: Yes Has patient had a PCN reaction causing severe rash involving mucus membranes or skin necrosis: Yes Has patient had a PCN reaction that required hospitalization: Yes Has patient had a PCN reaction occurring within the last 10 years: No If all of the above answers are "NO", then may proceed with Cephalosporin use.   Cymbalta [duloxetine Hcl]    "crazy thoughts"      Medication List    STOP taking these medications   cloNIDine 0.1 MG tablet Commonly known as:  CATAPRES     TAKE these medications   albuterol 108 (90 Base) MCG/ACT inhaler Commonly known as:  Ventolin HFA Inhale 1-2 puffs into the lungs every 6 (  six) hours as needed (wheezing or shortness of breath).   cyclobenzaprine 10 MG tablet Commonly known as:  FLEXERIL TAKE 1 TABLET BY MOUTH 3 TIMES DAILY AS NEEDED FOR MUSCLE SPASMS What changed:  See the new instructions.   diazePAM 5 MG/5ML Soln Take 0.3 mLs (0.3 mg total) by mouth 2 (two) times daily for 2 days, THEN 0.2 mLs (0.2 mg total) 2 (two) times daily for 2 days, THEN 0.1 mLs (0.1 mg total) 2 (two) times daily for 2 days. Start taking on:  May 24, 2018   doxycycline 100 MG capsule Commonly known as:  VIBRAMYCIN Take 2 capsules (200 mg total) by mouth daily for 5 days.   fluconazole 100 MG tablet Commonly known as:  Diflucan Take 1 tablet (100 mg total) by mouth daily.     Fluticasone-Salmeterol 250-50 MCG/DOSE Aepb Commonly known as:  ADVAIR Inhale 1 puff into the lungs 2 (two) times daily.   gabapentin 300 MG capsule Commonly known as:  NEURONTIN TAKE 1 CAPSULE BY MOUTH 3 TIMES DAILY   hydrochlorothiazide 12.5 MG tablet Commonly known as:  HYDRODIURIL Take 1 tablet (12.5 mg total) by mouth daily.   ipratropium-albuterol 0.5-2.5 (3) MG/3ML Soln Commonly known as:  DUONEB INHALE THE CONTENTS OF 1 VIAL VIA NEBULIZER EVERY 6 HOURS AS DIRECTED FOR SHORTNESS OF BREATH OR WHEEZING What changed:    how much to take  how to take this  when to take this   Combivent Respimat 20-100 MCG/ACT Aers respimat Generic drug:  Ipratropium-Albuterol INHALE 1 PUFF BY MOUTH INTO THE LUNGS FOUR TIMES A DAY What changed:  See the new instructions.   Janumet 50-500 MG tablet Generic drug:  sitaGLIPtin-metformin TAKE 1 TABLET BY MOUTH ONCE A DAY   nicotine polacrilex 2 MG gum Commonly known as:  NICORETTE CHEW 1 PIECE BY MOUTH AS NEEDED FOR SMOKING CESSATION   nystatin 100000 UNIT/ML suspension Commonly known as:  MYCOSTATIN Take 5 mLs (500,000 Units total) by mouth 4 (four) times daily. Swish and swallow   omeprazole 40 MG capsule Commonly known as:  PRILOSEC TAKE 1 CAPSULE BY MOUTH EVERY DAY What changed:    how much to take  how to take this  when to take this   oxyCODONE-acetaminophen 10-325 MG tablet Commonly known as:  PERCOCET Take 1 tablet by mouth every 4 (four) hours.   predniSONE 10 MG tablet Commonly known as:  DELTASONE Take 4 tablets (40 mg total) by mouth daily for 1 day, THEN 3 tablets (30 mg total) daily for 1 day, THEN 2 tablets (20 mg total) daily for 1 day, THEN 1 tablet (10 mg total) daily for 1 day. Start taking on:  May 28, 2018   rosuvastatin 20 MG tablet Commonly known as:  CRESTOR Take 0.5 tablets (10 mg total) by mouth daily.   Vitamin D (Ergocalciferol) 1.25 MG (50000 UT) Caps capsule Commonly known as:   DRISDOL Take 1 capsule (50,000 Units total) by mouth every 7 (seven) days.      Allergies  Allergen Reactions   Penicillins Anaphylaxis    Has patient had a PCN reaction causing immediate rash, facial/tongue/throat swelling, SOB or lightheadedness with hypotension: Yes Has patient had a PCN reaction causing severe rash involving mucus membranes or skin necrosis: Yes Has patient had a PCN reaction that required hospitalization: Yes Has patient had a PCN reaction occurring within the last 10 years: No If all of the above answers are "NO", then may proceed with Cephalosporin use.  Cymbalta [Duloxetine Hcl]     "crazy thoughts"      The results of significant diagnostics from this hospitalization (including imaging, microbiology, ancillary and laboratory) are listed below for reference.    Significant Diagnostic Studies: Ct Angio Chest Pe W And/or Wo Contrast  Result Date: 05/26/2018 CLINICAL DATA:  Increased shortness of breath last night, epigastric pain, elevated D-dimer question pulmonary embolism, history COPD, diabetes mellitus, former smoker EXAM: CT ANGIOGRAPHY CHEST WITH CONTRAST TECHNIQUE: Multidetector CT imaging of the chest was performed using the standard protocol during bolus administration of intravenous contrast. Multiplanar CT image reconstructions and MIPs were obtained to evaluate the vascular anatomy. CONTRAST:  100mL OMNIPAQUE IOHEXOL 350 MG/ML SOLN IV COMPARISON:  None FINDINGS: Cardiovascular: Mild atherosclerotic calcification aorta. Aorta normal caliber without aneurysm. Heart upper normal size without pericardial effusion. Pulmonary arteries well opacified and patent. No evidence of pulmonary embolism. Mediastinum/Nodes: Esophagus unremarkable. Base of cervical region normal appearance. Scattered normal size mediastinal lymph nodes. No definite thoracic adenopathy. Lungs/Pleura: Emphysematous changes most severe at apices. Peribronchial thickening. Scattered areas  of mucous plugging in the lower lobes. Bibasilar atelectasis. No definite acute infiltrate, pleural effusion or pneumothorax. Upper Abdomen: Visualized upper abdomen unremarkable Musculoskeletal: Prior cervical spine fusion. No acute osseous abnormalities. Review of the MIP images confirms the above findings. IMPRESSION: No evidence of pulmonary embolism. Bronchitic changes with scattered areas of mucous plugging and bibasilar atelectasis. Aortic Atherosclerosis (ICD10-I70.0) and Emphysema (ICD10-J43.9). Electronically Signed   By: Ulyses SouthwardMark  Boles M.D.   On: 05/26/2018 15:54   Dg Chest Port 1 View  Result Date: 05/26/2018 CLINICAL DATA:  Shortness of breath started last night EXAM: PORTABLE CHEST 1 VIEW COMPARISON:  04/12/2018 FINDINGS: The lungs are hyperinflated likely secondary to COPD. There is no focal consolidation. There is increased prominence of the interstitial markings at the lung bases. There is no pleural effusion or pneumothorax. The heart and mediastinal contours are unremarkable. The osseous structures are unremarkable. IMPRESSION: 1. No acute cardiopulmonary disease. 2. COPD. Electronically Signed   By: Elige KoHetal  Patel   On: 05/26/2018 12:59    Microbiology: Recent Results (from the past 240 hour(s))  SARS Coronavirus 2 [facility](Hospital order, Performed in [facility] lab)     Status: None   Collection Time: 05/26/18 12:09 PM  Result Value Ref Range Status   SARS Coronavirus 2 NEGATIVE NEGATIVE Final    Comment: (NOTE) If result is NEGATIVE SARS-CoV-2 target nucleic acids are NOT DETECTED. The SARS-CoV-2 RNA is generally detectable in upper and lower  respiratory specimens during the acute phase of infection. The lowest  concentration of SARS-CoV-2 viral copies this assay can detect is 250  copies / mL. A negative result does not preclude SARS-CoV-2 infection  and should not be used as the sole basis for treatment or other  patient management decisions.  A negative result may occur  with  improper specimen collection / handling, submission of specimen other  than nasopharyngeal swab, presence of viral mutation(s) within the  areas targeted by this assay, and inadequate number of viral copies  (<250 copies / mL). A negative result must be combined with clinical  observations, patient history, and epidemiological information. If result is POSITIVE SARS-CoV-2 target nucleic acids are DETECTED. The SARS-CoV-2 RNA is generally detectable in upper and lower  respiratory specimens dur ing the acute phase of infection.  Positive  results are indicative of active infection with SARS-CoV-2.  Clinical  correlation with patient history and other diagnostic information is  necessary to  determine patient infection status.  Positive results do  not rule out bacterial infection or co-infection with other viruses. If result is PRESUMPTIVE POSTIVE SARS-CoV-2 nucleic acids MAY BE PRESENT.   A presumptive positive result was obtained on the submitted specimen  and confirmed on repeat testing.  While 2019 novel coronavirus  (SARS-CoV-2) nucleic acids may be present in the submitted sample  additional confirmatory testing may be necessary for epidemiological  and / or clinical management purposes  to differentiate between  SARS-CoV-2 and other Sarbecovirus currently known to infect humans.  If clinically indicated additional testing with an alternate test  methodology [phone]) is advised. The SARS-CoV-2 RNA is generally  detectable in upper and lower respiratory sp ecimens during the acute  phase of infection. The expected result is Negative. Fact Sheet for Patients:  BoilerBrush.com.cy Fact Sheet for Healthcare Providers: https://pope.com/ This test is not yet approved or cleared by the [place] FDA and has been authorized for detection and/or diagnosis of SARS-CoV-2 by FDA under an Emergency Use Authorization (EUA).  This EUA  will remain in effect (meaning this test can be used) for the duration of the COVID-19 declaration under Section 564(b)(1) of the Act, 21 U.S.C. section 360bbb-3(b)(1), unless the authorization is terminated or revoked sooner. Performed at [facility], [address] [place], [place] [address]   Blood Culture (routine x 2)     Status: None (Preliminary result)   Collection Time: 05/26/18 12:09 PM  Result Value Ref Range Status   Specimen Description   Final    BLOOD RIGHT ANTECUBITAL Performed at [facility], [address] [place], [place] [address]    Special Requests   Final    BOTTLES DRAWN AEROBIC AND ANAEROBIC Blood Culture adequate volume Performed at [facility], [address] [place], [place] [address]    Culture   Final    NO GROWTH 2 DAYS Performed at [facility], [address] [place], [place] [address]    Report Status PENDING  Incomplete  Culture, blood (Routine x 2)     Status: Abnormal (Preliminary result)   Collection Time: 05/26/18 12:12 PM  Result Value Ref Range Status   Specimen Description   Final    BLOOD LEFT ANTECUBITAL Performed at [facility], [address], [place], [place] [address]    Special Requests   Final    BOTTLES DRAWN AEROBIC AND ANAEROBIC Blood Culture adequate volume Performed at [facility], [address] [place], [place] [address]    Culture  Setup Time   Final    GRAM POSITIVE COCCI IN CLUSTERS AEROBIC BOTTLE ONLY CRITICAL RESULT CALLED TO, READ BACK BY AND VERIFIED WITH: PHARMD Mickel Fuchs [id number] MLM    Culture (A)  Final    STAPHYLOCOCCUS SPECIES (COAGULASE NEGATIVE) THE SIGNIFICANCE OF ISOLATING THIS ORGANISM FROM A SINGLE SET OF BLOOD CULTURES WHEN MULTIPLE SETS ARE DRAWN IS UNCERTAIN. PLEASE NOTIFY THE MICROBIOLOGY DEPARTMENT WITHIN ONE WEEK IF SPECIATION AND SENSITIVITIES ARE REQUIRED. Performed at [facility], [address], [place], [place] [address]    Report Status PENDING  Incomplete  Blood Culture ID Panel (Reflexed)     Status: Abnormal   Collection Time: 05/26/18 12:12 PM  Result Value Ref Range Status   Enterococcus species NOT DETECTED NOT DETECTED Final   Listeria monocytogenes NOT DETECTED NOT DETECTED Final   Staphylococcus species DETECTED (A) NOT DETECTED Final  Comment: Methicillin (oxacillin) susceptible coagulase negative staphylococcus. Possible blood culture contaminant (unless isolated from more than one blood culture draw or clinical case suggests pathogenicity). No antibiotic treatment is indicated for blood  culture contaminants. CRITICAL RESULT CALLED TO, READ BACK BY AND VERIFIED WITH: PHARMD Mickel Fuchs [id number] MLM    Staphylococcus aureus (BCID) NOT DETECTED NOT DETECTED Final   Methicillin resistance NOT DETECTED NOT DETECTED Final   Streptococcus species NOT DETECTED NOT DETECTED Final   Streptococcus agalactiae NOT DETECTED NOT DETECTED Final   Streptococcus pneumoniae NOT DETECTED NOT DETECTED Final   Streptococcus pyogenes NOT DETECTED NOT DETECTED Final   Acinetobacter baumannii NOT DETECTED NOT DETECTED Final   Enterobacteriaceae species NOT DETECTED NOT DETECTED Final   Enterobacter cloacae complex NOT DETECTED NOT DETECTED Final   Escherichia coli NOT DETECTED NOT DETECTED Final   Klebsiella oxytoca NOT DETECTED NOT DETECTED Final   Klebsiella pneumoniae NOT DETECTED NOT DETECTED Final   Proteus species NOT DETECTED NOT DETECTED Final   Serratia marcescens NOT DETECTED NOT DETECTED Final   Haemophilus influenzae NOT DETECTED NOT DETECTED Final   Neisseria meningitidis NOT DETECTED NOT DETECTED Final   Pseudomonas aeruginosa NOT DETECTED NOT DETECTED Final   Candida albicans NOT DETECTED NOT DETECTED Final   Candida glabrata NOT DETECTED NOT DETECTED Final   Candida krusei NOT DETECTED NOT DETECTED Final   Candida parapsilosis NOT  DETECTED NOT DETECTED Final   Candida tropicalis NOT DETECTED NOT DETECTED Final    Comment: Performed at [facility], [address] [place], [place] [address]     Labs: Basic Metabolic Panel: Recent Labs  Lab 05/26/18 1209 05/28/18 0556  NA 141 140  K 4.9 4.6  CL 107 104  CO2 26 29  GLUCOSE 125* 154*  BUN 9 21*  CREATININE 1.10 0.94  CALCIUM 8.7* 9.2   Liver Function Tests: Recent Labs  Lab 05/26/18 1209  AST 31  ALT 17  ALKPHOS 73  BILITOT 1.2  PROT 6.9  ALBUMIN 3.8   No results for input(s): LIPASE, AMYLASE in the last 168 hours. No results for input(s): AMMONIA in the last 168 hours. CBC: Recent Labs  Lab 05/26/18 1215 05/28/18 0556  WBC 7.4 15.3*  NEUTROABS 5.3  --   HGB 14.3 13.9  HCT 46.1 43.8  MCV 94.7 95.0  PLT 217 225   Cardiac Enzymes: No results for input(s): CKTOTAL, CKMB, CKMBINDEX, TROPONINI in the last 168 hours. BNP: BNP (last 3 results) Recent Labs    04/05/18 0510  BNP 609.6*    ProBNP (last 3 results) No results for input(s): PROBNP in the last 8760 hours.  CBG: Recent Labs  Lab 05/27/18 1158 05/27/18 1653 05/27/18 2113 05/28/18 0728 05/28/18 1137  GLUCAP 179* 190* 162* 150* 182*       Signed:  Laverna Peace, MD [facility] 05/28/2018, 12:39 PM

## 2018-05-28 NOTE — Progress Notes (Signed)
SATURATION QUALIFICATIONS: (This note is used to comply with regulatory documentation for home oxygen)  Patient Saturations on Room Air at Rest = 94%  Patient Saturations on Room Air while Ambulating = 90%  Patient Saturations on N/A Liters of oxygen while Ambulating = N/A  Please briefly explain why patient needs home oxygen: patient does not qualify for oxygen at home for continuous use at this time, patient ambulated in hall and stayed at oxygen of 90% entire time on room air.

## 2018-05-29 LAB — CULTURE, BLOOD (ROUTINE X 2): Special Requests: ADEQUATE

## 2018-05-31 DIAGNOSIS — M542 Cervicalgia: Secondary | ICD-10-CM | POA: Diagnosis not present

## 2018-05-31 DIAGNOSIS — Z79899 Other long term (current) drug therapy: Secondary | ICD-10-CM | POA: Diagnosis not present

## 2018-05-31 DIAGNOSIS — Z87891 Personal history of nicotine dependence: Secondary | ICD-10-CM | POA: Diagnosis not present

## 2018-05-31 DIAGNOSIS — F17210 Nicotine dependence, cigarettes, uncomplicated: Secondary | ICD-10-CM | POA: Diagnosis not present

## 2018-05-31 LAB — CULTURE, BLOOD (ROUTINE X 2)
Culture: NO GROWTH
Special Requests: ADEQUATE

## 2018-06-04 ENCOUNTER — Other Ambulatory Visit: Payer: Self-pay | Admitting: Family Medicine

## 2018-06-04 ENCOUNTER — Telehealth: Payer: Self-pay | Admitting: Radiology

## 2018-06-04 NOTE — Telephone Encounter (Signed)
Enrolled patient for a 3 day Zio monitor to be mailed due to covid-19. Brief instructions were gone over with the patient and he knows to expect the monitor to arrive in 3-4 days.

## 2018-06-04 NOTE — Telephone Encounter (Signed)
*  48 hr Holter monitor was switched to a 3 day Zio for mailing purposes.

## 2018-06-05 ENCOUNTER — Telehealth (INDEPENDENT_AMBULATORY_CARE_PROVIDER_SITE_OTHER): Payer: Medicare HMO | Admitting: Family Medicine

## 2018-06-05 ENCOUNTER — Encounter: Payer: Self-pay | Admitting: Family Medicine

## 2018-06-05 ENCOUNTER — Other Ambulatory Visit: Payer: Self-pay

## 2018-06-05 DIAGNOSIS — Z79891 Long term (current) use of opiate analgesic: Secondary | ICD-10-CM

## 2018-06-05 DIAGNOSIS — J441 Chronic obstructive pulmonary disease with (acute) exacerbation: Secondary | ICD-10-CM

## 2018-06-05 DIAGNOSIS — G894 Chronic pain syndrome: Secondary | ICD-10-CM | POA: Diagnosis not present

## 2018-06-05 MED ORDER — PREDNISONE 20 MG PO TABS: ORAL_TABLET | ORAL | 0 refills | Status: AC

## 2018-06-05 NOTE — Progress Notes (Signed)
Virtual Visit Note  I connected with patient on 06/05/18 at 1039am by phone and verified that I am speaking with the correct person using two identifiers. Scott [name] is currently located at home and patient is currently with them during visit. The provider, Myles Lipps, MD is located in their office at time of visit.  I discussed the limitations, risks, security and privacy concerns of performing an evaluation and management service by telephone and the availability of in person appointments. I also discussed with the patient that there may be a patient responsible charge related to this service. The patient expressed understanding and agreed to proceed.   CC: hosp followup  HPI ? Patient is a58 y.o.malewith past medical history significant for DM2,HTNHLP,severeCOPD and chronic painon opiates with recent accidental ODwho presents today for hosp followup   hosp from 05/26/2018 - 05/28/2018 for COPD exacerbation, presented in acute resp failure, hypoxic, covid 19 neg, CXR not acute, CTA no PE, ekgs stable. Normal CBC. Weaned back to baseline night O2, home rx pred and doxy  Patient reports that he has been using duoneb nebulizer 4 x day advair 2 x day Still using albuterol about 3-4 x days When he was on prednisone he was doing better He has completed both pred and doxy Sees pulm q 6 months, last OV 2 months ago Has established with pain at [facility] Will be getting holter via mail  Allergies  Allergen Reactions  . Penicillins Anaphylaxis    Has patient had a PCN reaction causing immediate rash, facial/tongue/throat swelling, SOB or lightheadedness with hypotension: Yes Has patient had a PCN reaction causing severe rash involving mucus membranes or skin necrosis: Yes Has patient had a PCN reaction that required hospitalization: Yes Has patient had a PCN reaction occurring within the last 10 years: No If all of the above answers are "NO", then may proceed with Cephalosporin  use.   Marland Kitchen Cymbalta [Duloxetine Hcl]     "crazy thoughts"    Prior to Admission medications   Medication Sig Start Date End Date Taking? Authorizing Provider  albuterol (VENTOLIN HFA) 108 (90 Base) MCG/ACT inhaler Inhale 1-2 puffs into the lungs every 6 (six) hours as needed (wheezing or shortness of breath). 05/02/18  Yes Byrum, Les Pou, MD  COMBIVENT RESPIMAT 20-100 MCG/ACT AERS respimat INHALE 1 PUFF BY MOUTH INTO THE LUNGS FOUR TIMES A DAY Patient taking differently: Inhale 2 puffs into the lungs 4 (four) times daily.  05/08/18  Yes Byrum, Les Pou, MD  cyclobenzaprine (FLEXERIL) 10 MG tablet TAKE 1 TABLET BY MOUTH 3 TIMES DAILY AS NEEDED FOR MUSCLE SPASMS Patient taking differently: Take 10 mg by mouth 3 (three) times daily as needed for muscle spasms.  05/09/18  Yes Myles Lipps, MD  Fluticasone-Salmeterol (ADVAIR) 250-50 MCG/DOSE AEPB Inhale 1 puff into the lungs 2 (two) times daily. 04/08/18  Yes Leslye Peer, MD  gabapentin (NEURONTIN) 300 MG capsule TAKE 1 CAPSULE BY MOUTH 3 TIMES DAILY 05/09/18  Yes Myles Lipps, MD  hydrochlorothiazide (HYDRODIURIL) 12.5 MG tablet Take 1 tablet (12.5 mg total) by mouth daily. 10/25/17  Yes Myles Lipps, MD  ipratropium-albuterol (DUONEB) 0.5-2.5 (3) MG/3ML SOLN INHALE THE CONTENTS OF 1 VIAL VIA NEBULIZER EVERY 6 HOURS AS DIRECTED FOR SHORTNESS OF BREATH OR WHEEZING Patient taking differently: Inhale 3 mLs into the lungs 4 (four) times daily. INHALE THE CONTENTS OF 1 VIAL VIA NEBULIZER EVERY 6 HOURS AS DIRECTED FOR SHORTNESS OF BREATH OR WHEEZING 04/10/18  Yes  Leslye Peer, MD  JANUMET 50-500 MG tablet TAKE 1 TABLET BY MOUTH ONCE A DAY 05/09/18  Yes Myles Lipps, MD  nicotine polacrilex (NICORETTE) 2 MG gum CHEW 1 PIECE BY MOUTH AS NEEDED FOR SMOKING CESSATION 03/13/18  Yes Myles Lipps, MD  nystatin (MYCOSTATIN) 100000 UNIT/ML suspension Take 5 mLs (500,000 Units total) by mouth 4 (four) times daily. Swish and swallow 04/29/18  Yes  Myles Lipps, MD  omeprazole (PRILOSEC) 40 MG capsule TAKE 1 CAPSULE BY MOUTH EVERY DAY 06/04/18  Yes Myles Lipps, MD  oxyCODONE-acetaminophen (PERCOCET) 10-325 MG tablet Take 1 tablet by mouth every 4 (four) hours. 05/28/18  Yes Roberto Scales D, MD  rosuvastatin (CRESTOR) 20 MG tablet Take 0.5 tablets (10 mg total) by mouth daily. 09/25/17  Yes Myles Lipps, MD  Vitamin D, Ergocalciferol, (DRISDOL) 50000 units CAPS capsule Take 1 capsule (50,000 Units total) by mouth every 7 (seven) days. 10/27/17  Yes Myles Lipps, MD  fluconazole (DIFLUCAN) 100 MG tablet Take 1 tablet (100 mg total) by mouth daily. Patient not taking: Reported on 05/26/2018 04/18/18   Elvina Sidle, MD    Past Medical History:  Diagnosis Date  . 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 COPD (chronic obstructive pulmonary disease) (HCC)   . Diabetes mellitus without complication (HCC)   . GERD (gastroesophageal reflux disease)   . Headache(784.0)   . Pneumonia    hx    Past Surgical History:  Procedure Laterality Date  . ANTERIOR CERVICAL DECOMP/DISCECTOMY FUSION  12/22/2010   Procedure: ANTERIOR CERVICAL DECOMPRESSION/DISCECTOMY FUSION 3 LEVELS;  Surgeon: Cristi Loron;  Location: [facility];  Service: Neurosurgery;  Laterality: N/A;  Anterior cervical discectomy with fusion cervical three-four, four-five, and five sixCDF with Interbody Prosthesis, plating, and Bone Graft   . ANTERIOR CERVICAL DECOMP/DISCECTOMY FUSION N/A 10/16/2012   Procedure: CERVICAL SIX-SEVEN ANTERIOR CERVICAL DECOMPRESSION/DISCECTOMY FUSION WITH INTERBODY PROTHESIS PLATING BONEGRAFT WITH /POSSIBLE HARDWARE REMOVAL OLD PLATE;  Surgeon: Cristi Loron, MD;  Location: [facility];  Service: Neurosurgery;  Laterality: N/A;  . MULTIPLE TOOTH EXTRACTIONS    . OTHER SURGICAL HISTORY     surgery on cheekbone and head for fall 2000  . 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 SPINE SURGERY     2014 and 2016 - Dr Lovell Sheehan  . TONSILLECTOMY      Social History   Tobacco Use  . Smoking status: Former Smoker    Packs/day: 0.25    Years: 41.00    Pack years: 10.25    Types: Cigarettes    Last attempt to quit: 01/21/2018    Years since quitting: 0.3  . Smokeless tobacco: Never Used  Substance Use Topics  . Alcohol use: Yes    Comment: Beer occasionally    Family History  Problem Relation Age of Onset  . Emphysema Mother   . COPD Mother   . Asthma Brother   . Diabetes Brother   . Hyperlipidemia Brother   . Hypertension Brother   . Asthma Sister   . Hyperlipidemia Sister   . Hypertension Sister   . Mental illness Sister   . Heart disease Maternal Uncle   . Hypertension Brother     ROS Per hpi  Objective  Vitals as reported by the patient:  none  Speaking in full sentences No coughing noted during phone call  ASSESSMENT and PLAN  1. COPD exacerbation (HCC) Partial improvement. Repeating longer pred taper. Advised patient to make appt with pulm as baseline meds might need to be adjusted. Cont to smoke. ER precautions given.  2. Chronic pain syndrome 3. Long term prescription opiate use Has established care with pain med at [facility].  Other orders - predniSONE (DELTASONE) 20 MG tablet; Take 2 tablets (40 mg total) by mouth daily with breakfast for 3 days, THEN 1 tablet (20 mg total) daily with breakfast for 3 days, THEN 0.5 tablets (10 mg total) daily with breakfast for 3 days.  FOLLOW-UP: 2 weeks   The above assessment and management plan was discussed with the patient. The patient verbalized understanding of and has agreed to the management plan. Patient is aware to call the clinic if symptoms persist or worsen. Patient is aware when to return to the clinic for a follow-up visit. Patient educated on when it is appropriate to go to the emergency department.    I provided 15  minutes of non-face-to-face time during this encounter.  Myles Lipps, MD Primary Care at [facility] [address] [place], [place] [address] Ph.  [phone] Fax [phone]

## 2018-06-05 NOTE — Progress Notes (Signed)
Pt c/o follow up on SOB went to the ER and they said to follow up with PCP.

## 2018-06-06 ENCOUNTER — Telehealth: Payer: Self-pay | Admitting: Family Medicine

## 2018-06-06 NOTE — Telephone Encounter (Signed)
Copied from CRM [id number]. Topic: Referral - Status >> Jun 06, 2018  3:38 PM Floria Raveling A wrote: Reason for CRM:  Dr Pleas Koch office called in and stated that after review , this pt would not be a candidate due to non compliance.   Best Number [phone]

## 2018-06-07 NOTE — Telephone Encounter (Signed)
noted 

## 2018-06-07 NOTE — Telephone Encounter (Signed)
Please see note below. 

## 2018-06-09 ENCOUNTER — Ambulatory Visit (INDEPENDENT_AMBULATORY_CARE_PROVIDER_SITE_OTHER): Payer: Medicare HMO

## 2018-06-09 ENCOUNTER — Encounter: Payer: Medicare HMO | Admitting: Cardiology

## 2018-06-09 DIAGNOSIS — R002 Palpitations: Secondary | ICD-10-CM | POA: Diagnosis not present

## 2018-06-13 DIAGNOSIS — Z79899 Other long term (current) drug therapy: Secondary | ICD-10-CM | POA: Diagnosis not present

## 2018-06-13 DIAGNOSIS — F17210 Nicotine dependence, cigarettes, uncomplicated: Secondary | ICD-10-CM | POA: Diagnosis not present

## 2018-06-13 DIAGNOSIS — M542 Cervicalgia: Secondary | ICD-10-CM | POA: Diagnosis not present

## 2018-06-13 DIAGNOSIS — Z87891 Personal history of nicotine dependence: Secondary | ICD-10-CM | POA: Diagnosis not present

## 2018-06-19 ENCOUNTER — Other Ambulatory Visit: Payer: Self-pay

## 2018-06-19 ENCOUNTER — Telehealth (INDEPENDENT_AMBULATORY_CARE_PROVIDER_SITE_OTHER): Payer: Medicare HMO | Admitting: Family Medicine

## 2018-06-19 DIAGNOSIS — R002 Palpitations: Secondary | ICD-10-CM | POA: Diagnosis not present

## 2018-06-19 DIAGNOSIS — R0602 Shortness of breath: Secondary | ICD-10-CM | POA: Diagnosis not present

## 2018-06-19 MED ORDER — NYSTATIN 100000 UNIT/ML MT SUSP: 5.0000 mL | Freq: Four times a day (QID) | OROMUCOSAL | 1 refills | Status: DC

## 2018-06-19 NOTE — Progress Notes (Signed)
Virtual Visit Note  I connected with patient on 06/19/18 at 1156am by phone and verified that I am speaking with the correct person using two identifiers. Scott [name] is currently located at home and patient is currently with them during visit. The provider, Rutherford Guys, MD is located in their office at time of visit.  I discussed the limitations, risks, security and privacy concerns of performing an evaluation and management service by telephone and the availability of in person appointments. I also discussed with the patient that there may be a patient responsible charge related to this service. The patient expressed understanding and agreed to proceed.   CC: COPD exacerbation  HPI ? Patient is a7 y.o.malewith past medical history significant for DM2,HTNHLP,severeCOPD and chronic painon opiates with recent accidental ODwho presents today for followup on COPD exacerbation  Last OV 2 weeks ago after hosp discharge. Had completed abx, was not better, therefore started long pred taper  Patient reports that his breathing is not better at all since he left the hospital or completed most recent pred taper Still feeling very SOB with exertion, fatigue Reports productive cough, thick, brown, white, yellow Mouth feels like he has dirt, lips are peeling, tongue is numb, no smell Denies any fever, chills, headaches  Sleeping with oxygen at night Using combivent during the day upto 4 times a day, uses combination of inhaler and neb solution Using advair twice a day, rinsing his medication Uses albuterol when he SOB, wheezing  Still cant walk within his house without getting very SOB  Denies any swelling of legs  Using nystatin, takes omeprazole 55m once a day Denies any blood in sputum  Neg covid CTA was neg holter pending No echo bnp 600  Has established with pain mgt Tried new medication - not tolerating well Made him snappy, decreased appetite He will discuss  with them at next appt   Allergies  Allergen Reactions  . Penicillins Anaphylaxis    Has patient had a PCN reaction causing immediate rash, facial/tongue/throat swelling, SOB or lightheadedness with hypotension: Yes Has patient had a PCN reaction causing severe rash involving mucus membranes or skin necrosis: Yes Has patient had a PCN reaction that required hospitalization: Yes Has patient had a PCN reaction occurring within the last 10 years: No If all of the above answers are "NO", then may proceed with Cephalosporin use.   .Marland KitchenCymbalta [Duloxetine Hcl]     "crazy thoughts"    Prior to Admission medications   Medication Sig Start Date End Date Taking? Authorizing Provider  albuterol (VENTOLIN HFA) 108 (90 Base) MCG/ACT inhaler Inhale 1-2 puffs into the lungs every 6 (six) hours as needed (wheezing or shortness of breath). 05/02/18   BCollene Gobble MD  BELBUCA 75 MCG FILM  06/13/18   [provider]  COMBIVENT RESPIMAT 20-100 MCG/ACT AERS respimat INHALE 1 PUFF BY MOUTH INTO THE LUNGS FOUR TIMES A DAY Patient taking differently: Inhale 2 puffs into the lungs 4 (four) times daily.  05/08/18   BCollene Gobble MD  Fluticasone-Salmeterol (ADVAIR) 250-50 MCG/DOSE AEPB Inhale 1 puff into the lungs 2 (two) times daily. 04/08/18   BCollene Gobble MD  gabapentin (NEURONTIN) 300 MG capsule TAKE 1 CAPSULE BY MOUTH 3 TIMES DAILY 05/09/18   SRutherford Guys MD  hydrochlorothiazide (HYDRODIURIL) 12.5 MG tablet Take 1 tablet (12.5 mg total) by mouth daily. 10/25/17   SRutherford Guys MD  ipratropium-albuterol (DUONEB) 0.5-2.5 (3) MG/3ML SOLN INHALE THE CONTENTS OF  1 VIAL VIA NEBULIZER EVERY 6 HOURS AS DIRECTED FOR SHORTNESS OF BREATH OR WHEEZING Patient taking differently: Inhale 3 mLs into the lungs 4 (four) times daily. INHALE THE CONTENTS OF 1 VIAL VIA NEBULIZER EVERY 6 HOURS AS DIRECTED FOR SHORTNESS OF BREATH OR WHEEZING 04/10/18   Collene Gobble, MD  JANUMET 50-500 MG tablet TAKE 1 TABLET BY  MOUTH ONCE A DAY 05/09/18   Rutherford Guys, MD  [facility] 4 MG/0.1ML LIQD nasal spray kit  06/13/18   [provider]  nicotine polacrilex (NICORETTE) 2 MG gum CHEW 1 PIECE BY MOUTH AS NEEDED FOR SMOKING CESSATION 03/13/18   Rutherford Guys, MD  nystatin (MYCOSTATIN) 100000 UNIT/ML suspension Take 5 mLs (500,000 Units total) by mouth 4 (four) times daily. Swish and swallow 04/29/18   Rutherford Guys, MD  omeprazole (PRILOSEC) 40 MG capsule TAKE 1 CAPSULE BY MOUTH EVERY DAY 06/04/18   Rutherford Guys, MD  oxyCODONE-acetaminophen (PERCOCET) 10-325 MG tablet Take 1 tablet by mouth every 4 (four) hours. 05/28/18   Oretha Milch D, MD  rosuvastatin (CRESTOR) 20 MG tablet Take 0.5 tablets (10 mg total) by mouth daily. 09/25/17   Rutherford Guys, MD  Vitamin D, Ergocalciferol, (DRISDOL) 50000 units CAPS capsule Take 1 capsule (50,000 Units total) by mouth every 7 (seven) days. 10/27/17   Rutherford Guys, MD    Past Medical History:  Diagnosis Date  . 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 COPD (chronic obstructive pulmonary disease) ([place])   . Diabetes mellitus without complication ([place])   . GERD (gastroesophageal reflux disease)   . Headache(784.0)   . Pneumonia    hx    Past Surgical History:  Procedure Laterality Date  . ANTERIOR CERVICAL DECOMP/DISCECTOMY FUSION  12/22/2010   Procedure: ANTERIOR CERVICAL DECOMPRESSION/DISCECTOMY FUSION 3 LEVELS;  Surgeon: Ophelia Charter;  Location: [facility];  Service: Neurosurgery;  Laterality: N/A;  Anterior cervical discectomy with fusion cervical three-four, four-five, and five sixCDF with Interbody Prosthesis, plating, and Bone Graft   . ANTERIOR CERVICAL DECOMP/DISCECTOMY FUSION N/A 10/16/2012   Procedure: CERVICAL SIX-SEVEN ANTERIOR CERVICAL DECOMPRESSION/DISCECTOMY FUSION WITH INTERBODY PROTHESIS PLATING BONEGRAFT WITH /POSSIBLE HARDWARE REMOVAL OLD PLATE;  Surgeon: Ophelia Charter, MD;  Location: [facility];  Service:  Neurosurgery;  Laterality: N/A;  . MULTIPLE TOOTH EXTRACTIONS    . OTHER SURGICAL HISTORY     surgery on cheekbone and head for fall 2000  . 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     [place] and 2016 - Dr JArnoldo Morale . TONSILLECTOMY      Social History   Tobacco Use  . Smoking status: Former Smoker    Packs/day: 0.25    Years: 41.00    Pack years: 10.25    Types: Cigarettes    Last attempt to quit: 01/21/2018    Years since quitting: 0.4  . Smokeless tobacco: Never Used  Substance Use Topics  . Alcohol use: Yes    Comment: Beer occasionally    Family History  Problem Relation Age of Onset  . Emphysema Mother   . COPD Mother   . Asthma Brother   . Diabetes Brother   . Hyperlipidemia Brother   . Hypertension Brother   . Asthma Sister   . Hyperlipidemia Sister   .  Hypertension Sister   . Mental illness Sister   . Heart disease Maternal Uncle   . Hypertension Brother     ROS Per hpi  Objective  Vitals as reported by the patient: none   ASSESSMENT and PLAN  1. SOB (shortness of breath) Patient not improving after hosp discharge for COPD exacerbation. Cont to have sign DOE. Advised patient to return to ER for further eval and treatment.   Other orders - nystatin (MYCOSTATIN) 100000 UNIT/ML suspension; Take 5 mLs (500,000 Units total) by mouth 4 (four) times daily. Swish and swallow  FOLLOW-UP: after er/hosp   The above assessment and management plan was discussed with the patient. The patient verbalized understanding of and has agreed to the management plan. Patient is aware to call the clinic if symptoms persist or worsen. Patient is aware when to return to the clinic for a follow-up visit. Patient educated on when it is appropriate to go to the emergency department.    I provided 22 minutes of non-face-to-face time during this encounter.  Rutherford Guys, MD Primary Care at [place], [place] [address] Ph.  [phone] Fax [phone]

## 2018-06-19 NOTE — Progress Notes (Signed)
Follow up with medical history significant for DM2,HTNHLP,severeCOPD and chronic pain. He is now taking Belbuca . Says he only took the med for 3 days because he says it interfered with his appetite and made him feel funny. He says he will give the medication back to the doctor at [facility]. He is also wanting talk about the results from the cardiologist for Holter monitor testing

## 2018-06-21 ENCOUNTER — Other Ambulatory Visit: Payer: Self-pay

## 2018-06-25 ENCOUNTER — Other Ambulatory Visit: Payer: Self-pay

## 2018-06-25 ENCOUNTER — Other Ambulatory Visit: Payer: Self-pay | Admitting: Cardiology

## 2018-06-25 DIAGNOSIS — R002 Palpitations: Secondary | ICD-10-CM

## 2018-06-27 DIAGNOSIS — J449 Chronic obstructive pulmonary disease, unspecified: Secondary | ICD-10-CM | POA: Diagnosis not present

## 2018-06-27 DIAGNOSIS — Z79899 Other long term (current) drug therapy: Secondary | ICD-10-CM | POA: Diagnosis not present

## 2018-06-27 DIAGNOSIS — M542 Cervicalgia: Secondary | ICD-10-CM | POA: Diagnosis not present

## 2018-06-27 DIAGNOSIS — Z1159 Encounter for screening for other viral diseases: Secondary | ICD-10-CM | POA: Diagnosis not present

## 2018-07-02 ENCOUNTER — Other Ambulatory Visit: Payer: Self-pay | Admitting: Family Medicine

## 2018-07-09 DIAGNOSIS — Z1331 Encounter for screening for depression: Secondary | ICD-10-CM | POA: Diagnosis not present

## 2018-07-09 DIAGNOSIS — Z114 Encounter for screening for human immunodeficiency virus [HIV]: Secondary | ICD-10-CM | POA: Diagnosis not present

## 2018-07-09 DIAGNOSIS — F17210 Nicotine dependence, cigarettes, uncomplicated: Secondary | ICD-10-CM | POA: Diagnosis not present

## 2018-07-09 DIAGNOSIS — E1165 Type 2 diabetes mellitus with hyperglycemia: Secondary | ICD-10-CM | POA: Diagnosis not present

## 2018-07-09 DIAGNOSIS — E7800 Pure hypercholesterolemia, unspecified: Secondary | ICD-10-CM | POA: Diagnosis not present

## 2018-07-09 DIAGNOSIS — J42 Unspecified chronic bronchitis: Secondary | ICD-10-CM | POA: Diagnosis not present

## 2018-07-09 DIAGNOSIS — Z1159 Encounter for screening for other viral diseases: Secondary | ICD-10-CM | POA: Diagnosis not present

## 2018-07-09 DIAGNOSIS — J209 Acute bronchitis, unspecified: Secondary | ICD-10-CM | POA: Diagnosis not present

## 2018-07-09 DIAGNOSIS — R5383 Other fatigue: Secondary | ICD-10-CM | POA: Diagnosis not present

## 2018-07-09 DIAGNOSIS — Z0000 Encounter for general adult medical examination without abnormal findings: Secondary | ICD-10-CM | POA: Diagnosis not present

## 2018-07-09 DIAGNOSIS — Z1339 Encounter for screening examination for other mental health and behavioral disorders: Secondary | ICD-10-CM | POA: Diagnosis not present

## 2018-07-09 DIAGNOSIS — J449 Chronic obstructive pulmonary disease, unspecified: Secondary | ICD-10-CM | POA: Diagnosis not present

## 2018-07-09 DIAGNOSIS — E559 Vitamin D deficiency, unspecified: Secondary | ICD-10-CM | POA: Diagnosis not present

## 2018-07-09 DIAGNOSIS — Z7251 High risk heterosexual behavior: Secondary | ICD-10-CM | POA: Diagnosis not present

## 2018-07-10 ENCOUNTER — Emergency Department (HOSPITAL_COMMUNITY): Admission: EM | Admit: 2018-07-10 | Discharge: 2018-07-10 | Discharge status: Absent without leave | Payer: Medicare HMO

## 2018-07-10 ENCOUNTER — Encounter (HOSPITAL_COMMUNITY): Payer: Self-pay

## 2018-07-10 ENCOUNTER — Emergency Department (HOSPITAL_COMMUNITY): Payer: Medicare HMO

## 2018-07-10 ENCOUNTER — Other Ambulatory Visit: Payer: Self-pay

## 2018-07-10 ENCOUNTER — Telehealth: Payer: Self-pay | Admitting: Emergency Medicine

## 2018-07-10 ENCOUNTER — Emergency Department (HOSPITAL_COMMUNITY)
Admission: EM | Admit: 2018-07-10 | Discharge: 2018-07-10 | Discharge status: Against Medical Advice | Payer: Medicare HMO | Attending: Emergency Medicine | Admitting: Emergency Medicine

## 2018-07-10 DIAGNOSIS — M549 Dorsalgia, unspecified: Secondary | ICD-10-CM | POA: Diagnosis not present

## 2018-07-10 DIAGNOSIS — Z79899 Other long term (current) drug therapy: Secondary | ICD-10-CM | POA: Diagnosis not present

## 2018-07-10 DIAGNOSIS — Z20828 Contact with and (suspected) exposure to other viral communicable diseases: Secondary | ICD-10-CM | POA: Insufficient documentation

## 2018-07-10 DIAGNOSIS — Z7984 Long term (current) use of oral hypoglycemic drugs: Secondary | ICD-10-CM | POA: Diagnosis not present

## 2018-07-10 DIAGNOSIS — R0602 Shortness of breath: Secondary | ICD-10-CM | POA: Diagnosis not present

## 2018-07-10 DIAGNOSIS — J441 Chronic obstructive pulmonary disease with (acute) exacerbation: Secondary | ICD-10-CM | POA: Diagnosis not present

## 2018-07-10 DIAGNOSIS — Z87891 Personal history of nicotine dependence: Secondary | ICD-10-CM | POA: Diagnosis not present

## 2018-07-10 DIAGNOSIS — R05 Cough: Secondary | ICD-10-CM | POA: Diagnosis not present

## 2018-07-10 DIAGNOSIS — E119 Type 2 diabetes mellitus without complications: Secondary | ICD-10-CM | POA: Diagnosis not present

## 2018-07-10 LAB — CBC WITH DIFFERENTIAL/PLATELET
Abs Immature Granulocytes: 0.07 10*3/uL (ref 0.00–0.07)
Basophils Absolute: 0 10*3/uL (ref 0.0–0.1)
Basophils Relative: 0 %
Eosinophils Absolute: 0 10*3/uL (ref 0.0–0.5)
Eosinophils Relative: 0 %
HCT: 44.5 % (ref 39.0–52.0)
Hemoglobin: 14.4 g/dL (ref 13.0–17.0)
Immature Granulocytes: 1 %
Lymphocytes Relative: 5 %
Lymphs Abs: 0.6 10*3/uL — ABNORMAL LOW (ref 0.7–4.0)
MCH: 29.8 pg (ref 26.0–34.0)
MCHC: 32.4 g/dL (ref 30.0–36.0)
MCV: 92.1 fL (ref 80.0–100.0)
Monocytes Absolute: 0.3 10*3/uL (ref 0.1–1.0)
Monocytes Relative: 2 %
Neutro Abs: 13.3 10*3/uL — ABNORMAL HIGH (ref 1.7–7.7)
Neutrophils Relative %: 92 %
Platelets: 249 10*3/uL (ref 150–400)
RBC: 4.83 MIL/uL (ref 4.22–5.81)
RDW: 14.8 % (ref 11.5–15.5)
WBC: 14.3 10*3/uL — ABNORMAL HIGH (ref 4.0–10.5)
nRBC: 0 % (ref 0.0–0.2)

## 2018-07-10 LAB — COMPREHENSIVE METABOLIC PANEL
ALT: 16 U/L (ref 0–44)
AST: 19 U/L (ref 15–41)
Albumin: 4 g/dL (ref 3.5–5.0)
Alkaline Phosphatase: 63 U/L (ref 38–126)
Anion gap: 11 (ref 5–15)
BUN: 17 mg/dL (ref 6–20)
CO2: 23 mmol/L (ref 22–32)
Calcium: 9.2 mg/dL (ref 8.9–10.3)
Chloride: 107 mmol/L (ref 98–111)
Creatinine, Ser: 0.95 mg/dL (ref 0.61–1.24)
GFR calc Af Amer: >60 mL/min (ref >60)
GFR calc non Af Amer: >60 mL/min (ref >60)
Glucose, Bld: 139 mg/dL — ABNORMAL HIGH (ref 70–99)
Potassium: 4 mmol/L (ref 3.5–5.1)
Sodium: 141 mmol/L (ref 135–145)
Total Bilirubin: 0.5 mg/dL (ref 0.3–1.2)
Total Protein: 7.2 g/dL (ref 6.5–8.1)

## 2018-07-10 LAB — TROPONIN I: Troponin I: 0.03 ng/mL (ref <0.03)

## 2018-07-10 LAB — URINALYSIS, ROUTINE W REFLEX MICROSCOPIC
Bilirubin Urine: NEGATIVE
Glucose, UA: NEGATIVE mg/dL
Hgb urine dipstick: NEGATIVE
Ketones, ur: NEGATIVE mg/dL
Leukocytes,Ua: NEGATIVE
Nitrite: NEGATIVE
Protein, ur: NEGATIVE mg/dL
Specific Gravity, Urine: 1.021 (ref 1.005–1.030)
pH: 5 (ref 5.0–8.0)

## 2018-07-10 LAB — PROTIME-INR
INR: 1 (ref 0.8–1.2)
Prothrombin Time: 12.8 seconds (ref 11.4–15.2)

## 2018-07-10 LAB — BRAIN NATRIURETIC PEPTIDE: B Natriuretic Peptide: 446.8 pg/mL — ABNORMAL HIGH (ref 0.0–100.0)

## 2018-07-10 LAB — SARS CORONAVIRUS 2 BY RT PCR (HOSPITAL ORDER, PERFORMED IN ~~LOC~~ HOSPITAL LAB): SARS Coronavirus 2: NEGATIVE

## 2018-07-10 LAB — LACTIC ACID, PLASMA: Lactic Acid, Venous: 1.8 mmol/L (ref 0.5–1.9)

## 2018-07-10 MED ORDER — OXYCODONE-ACETAMINOPHEN 5-325 MG PO TABS
2.0000 | ORAL_TABLET | Freq: Once | ORAL | Status: AC
  Administered 2018-07-10: 2 via ORAL
  Filled 2018-07-10: qty 2

## 2018-07-10 NOTE — ED Notes (Signed)
Patient has verbalized that he understands that patient is leaving AMA.

## 2018-07-10 NOTE — ED Notes (Signed)
Date and time results received: 07/10/18 15:39  Test: Troponin  Critical Value: 0.03  Name of Provider Notified: Dr. Alvino Chapel   Orders Received? Or Actions Taken?: Continue to monitor patient and await new orders.

## 2018-07-10 NOTE — Telephone Encounter (Signed)
Called spoke with patient to discuss Scott Roch NP's recommendations Patient voiced his understanding but declined EMS services x2, stating that he is able to drive  While speaking with patient he did divulge additional pertinent information:  He traveled to [place] 1 week ago to attend COVID funerals x3   Patient stated that PCP checked serum COVID testing "because the swab isn't accurate" and that test result came back positive.  Patient stated this was 13 days ago.  Patient had repeat lab draw yesterday and is expecting to get this result this coming Friday.  There are no records of this testing in Epic.  Patient stated that he will proceed to [facility] ED within the hour (spouse was not at home and pt wanted to await her return).  Advised pt to call 911 if his symptoms worsen prior to spouse returning home and his subsequent ride to the ED.  Pt voiced his understanding.  Called [facility] ED and spoke with charge nurse Morey Hummingbird RN.  Report provided to [facility] RN using Scott [name]'s NP documentation and this office's possible COVID-19 concerns.  Morey Hummingbird RN voiced her understanding and repeated the information back to me for verification.  Nothing further needed at this time; will sign off.

## 2018-07-10 NOTE — ED Triage Notes (Signed)
Patient arrived via POV from PCP. Patient was instructed to drive to [facility], patient deicided to drive wife home first and has been walked through EMS bay to Room. Patient is AOx4 and ambulatory. Patient chief complaint is SOB after attending funeral with several family members being suspected of having COVID-58. Patient presents with SOB, hx of COPD, sent from PCP for having O2 Saturation in 80%'s.

## 2018-07-10 NOTE — ED Notes (Signed)
Patient has been given meal to eat while in ED.

## 2018-07-10 NOTE — ED Notes (Signed)
Patient is ready to go home and does not want to be admitted. Patient verbalized he has not received any of his breathing treatments and stated "I can take my medication at home, take this stuff off, Im ready to go home". RN will remove IV's and prepare patient for discharge AMA.

## 2018-07-10 NOTE — Telephone Encounter (Signed)
Primary Pulmonologist: RB Last office visit and with whom: 04/08/2018 with RB What do we see them for (pulmonary problems): COPD Last OV assessment/plan: Instructions  We will change your Advair back from the generic form to the name brand form.  Use twice daily.  Remember to rinse and gargle after using. Please use your Combivent 4 times a day on a schedule. Keep your albuterol and DuoNeb available to use up to every 6 hours if needed for shortness of breath, chest tightness, wheezing. Finish your prednisone prescription as written until completely gone. Use Hycodan cough syrup 5 cc up to every 6 hours if needed for cough suppression.  We will not be able to refill this prescription, its for one-time use. Follow with Dr Lamonte Sakai in 6 months or sooner if you have any problems     Was appointment offered to patient (explain)?  Pt wants recommendations   Reason for call: Called and spoke with pt. Pt has had complaints of SOB x1.5 months. Pt stated breathing had become better but then about 4 days ago pt's breathing became worse. Pt does have complaints of wheezing.  Pt was seen by PCP 6/16 and was prescribed prednisone and zpak.  Pt was in hospital 5/3-5/5 and COVID swab was performed which was negative. Called pt's PCP for clarification in regards to the labwork that pt had performed a couple weeks ago. Pt had a positive IGG from the labwork which was performed a couple weeks ago but had a negative IGM (PCP office is faxing lab results over for [place] to be able to have and review).  At PCP office, pt's sats were between 84%-87% and then was given an injection of prednisone and antibotics and then was given a breathing treatment. Pt stated after the breathing treatment, O2 went up to 92%. Pt stated today 6/17 he is breathing some better but is still having some problems. Pt states he has a lot of mucus in his chest and is coughing up thick white-yellow phlegm.  Pt stated two weeks ago, pt weighed 209 but  then yesterday 6/16 when pt went to PCP, his weight was 228.  Pt stated he has a loss of smell and loss of taste. Pt does have complaints of legs feeling swimmy, has muscle pain, has pain in stomach which he says is due to his breathing.  Pt denies any complaints of fever, severe headaches, or diarrhea.  Pt has had to use albuterol inhaler at least 5-6 times daily and has also had to use combivent inhaler 5-6 times daily. Pt has also had to do about 4 neb treatments daily.   Pt stated he was just tested for COVID yesterday 6/16 at [facility] PCP and said he goes back Friday 6/19 for a follow up and will find out the results of that then. Pt is having a cxr performed today.  Pt was told by PCP to contact our office due to what all has been going on within the last month up until now. Pt is wanting recommendations. Sarah, please advise. Thank you!

## 2018-07-10 NOTE — ED Provider Notes (Addendum)
[facility] DEPT Provider Note   CSN: [id number] Arrival date & time: 07/10/18  1334    History   Chief Complaint Chief Complaint  Patient presents with  . Shortness of Breath  . Suspsected COVID    HPI Scott [name] is a 58 y.o. male.     HPI Patient presents with shortness of breath.  Has been short of breath over the last week or 2.  Worse recently.  Recently has been in [place] for family members that have died of COVID.  Patient has had a reported positive antibody test level that was almost 2 weeks ago.  Has history of COPD and reported CHF.  Oxygen as needed at home.  Sats were in the 80s today however.  Concern of COPD by PCP.  Oxygenation improved on nasal cannula but still tachycardic.  Temperature 100 rectally.  States he has had chills.  Has had an occasional cough.  Patient's weight is up 20 pounds also. Past Medical History:  Diagnosis Date  . 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 COPD (chronic obstructive pulmonary disease) ([place])   . Diabetes mellitus without complication ([place])   . GERD (gastroesophageal reflux disease)   . Headache(784.0)   . Pneumonia    hx    Patient Active Problem List   Diagnosis Date Noted  . Nausea 05/27/2018  . Acute respiratory failure with hypoxia ([place]) 05/26/2018  . COPD with acute exacerbation ([place]) 04/04/2018  . Tachycardia 03/31/2018  . Chronic pain syndrome 08/29/2017  . Tobacco use disorder 05/13/2015  . Sebaceous cyst 01/08/2014  . Hypoxia 06/01/2013  . Acute bronchitis 06/01/2013  . Diabetes mellitus without complication ([place])   . GERD (gastroesophageal reflux disease)   . Cervical post-laminectomy syndrome 10/02/2012  . Degeneration of lumbar or lumbosacral intervertebral disc 06/01/2011  . COPD (chronic obstructive pulmonary disease) ([place]) 05/30/2011  . Hyperlipidemia 05/30/2011  . Allergic rhinitis 05/30/2011  . Cervical spondylosis with myelopathy  12/22/2010    Past Surgical History:  Procedure Laterality Date  . ANTERIOR CERVICAL DECOMP/DISCECTOMY FUSION  12/22/2010   Procedure: ANTERIOR CERVICAL DECOMPRESSION/DISCECTOMY FUSION 3 LEVELS;  Surgeon: Ophelia Charter;  Location: [facility];  Service: Neurosurgery;  Laterality: N/A;  Anterior cervical discectomy with fusion cervical three-four, four-five, and five sixCDF with Interbody Prosthesis, plating, and Bone Graft   . ANTERIOR CERVICAL DECOMP/DISCECTOMY FUSION N/A 10/16/2012   Procedure: CERVICAL SIX-SEVEN ANTERIOR CERVICAL DECOMPRESSION/DISCECTOMY FUSION WITH INTERBODY PROTHESIS PLATING BONEGRAFT WITH /POSSIBLE HARDWARE REMOVAL OLD PLATE;  Surgeon: Ophelia Charter, MD;  Location: [facility];  Service: Neurosurgery;  Laterality: N/A;  . MULTIPLE TOOTH EXTRACTIONS    . OTHER SURGICAL HISTORY     surgery on cheekbone and head for fall 2000  . 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 [facility]     2014 and 2016 - Dr JArnoldo Morale . TONSILLECTOMY          Home Medications    Prior to Admission medications   Medication Sig Start Date End Date Taking? Authorizing Provider  albuterol (VENTOLIN HFA) 108 (90 Base) MCG/ACT inhaler Inhale 1-2 puffs into the lungs every 6 (six) hours as needed (wheezing or shortness of breath). 05/02/18   BCollene Gobble MD  BELBUCA 75 MCG FILM  06/13/18   [provider]  COMBIVENT RESPIMAT 20-100 MCG/ACT AERS respimat INHALE 1 PUFF BY MOUTH INTO THE LUNGS FOUR TIMES A DAY Patient taking differently: Inhale 2 puffs into the lungs 4 (four) times daily.  05/08/18   Collene Gobble, MD  Fluticasone-Salmeterol (ADVAIR) 250-50 MCG/DOSE AEPB Inhale 1 puff into the lungs 2 (two) times daily. 04/08/18   Collene Gobble, MD  gabapentin (NEURONTIN) 300 MG capsule TAKE 1 CAPSULE BY MOUTH 3 TIMES DAILY 07/03/18   Rutherford Guys, MD  hydrochlorothiazide  (HYDRODIURIL) 12.5 MG tablet Take 1 tablet (12.5 mg total) by mouth daily. 10/25/17   Rutherford Guys, MD  ipratropium-albuterol (DUONEB) 0.5-2.5 (3) MG/3ML SOLN INHALE THE CONTENTS OF 1 VIAL VIA NEBULIZER EVERY 6 HOURS AS DIRECTED FOR SHORTNESS OF BREATH OR WHEEZING Patient taking differently: Inhale 3 mLs into the lungs 4 (four) times daily. INHALE THE CONTENTS OF 1 VIAL VIA NEBULIZER EVERY 6 HOURS AS DIRECTED FOR SHORTNESS OF BREATH OR WHEEZING 04/10/18   Collene Gobble, MD  JANUMET 50-500 MG tablet TAKE 1 TABLET BY MOUTH ONCE A DAY 05/09/18   Rutherford Guys, MD  [facility] 4 MG/0.1ML LIQD nasal spray kit  06/13/18   [provider]  nicotine polacrilex (NICORETTE) 2 MG gum CHEW 1 PIECE BY MOUTH AS NEEDED FOR SMOKING CESSATION 03/13/18   Rutherford Guys, MD  nystatin (MYCOSTATIN) 100000 UNIT/ML suspension Take 5 mLs (500,000 Units total) by mouth 4 (four) times daily. Swish and swallow 06/19/18   Rutherford Guys, MD  omeprazole (PRILOSEC) 40 MG capsule TAKE 1 CAPSULE BY MOUTH EVERY DAY 06/04/18   Rutherford Guys, MD  oxyCODONE-acetaminophen (PERCOCET) 10-325 MG tablet Take 1 tablet by mouth every 4 (four) hours. 05/28/18   Oretha Milch D, MD  rosuvastatin (CRESTOR) 20 MG tablet Take 0.5 tablets (10 mg total) by mouth daily. 09/25/17   Rutherford Guys, MD  Vitamin D, Ergocalciferol, (DRISDOL) 50000 units CAPS capsule Take 1 capsule (50,000 Units total) by mouth every 7 (seven) days. 10/27/17   Rutherford Guys, MD    Family History Family History  Problem Relation Age of Onset  . Emphysema Mother   . COPD Mother   . Asthma Brother   . Diabetes Brother   . Hyperlipidemia Brother   . Hypertension Brother   . Asthma Sister   . Hyperlipidemia Sister   . Hypertension Sister   . Mental illness Sister   . Heart disease Maternal Uncle   . Hypertension Brother     Social History Social History   Tobacco Use  . Smoking status: Former Smoker    Packs/day: 0.25    Years: 41.00    Pack  years: 10.25    Types: Cigarettes    Quit date: 01/21/2018    Years since quitting: 0.4  . Smokeless tobacco: Never Used  Substance Use Topics  . Alcohol use: Yes    Comment: Beer occasionally  . Drug use: No     Allergies   Penicillins and Cymbalta [duloxetine hcl]   Review of Systems Review of Systems  Constitutional: Positive for appetite change, chills and fatigue.  HENT:       Patient has loss of taste.  Respiratory: Positive for cough and shortness of breath.   Cardiovascular: Negative for chest pain.  Gastrointestinal: Negative for abdominal pain.  Genitourinary: Negative for flank pain.  Musculoskeletal: Positive for back pain.  Skin: Negative for rash.  Neurological: Positive for weakness.  Psychiatric/Behavioral: Negative for confusion.  Physical Exam Updated Vital Signs BP 134/83   Pulse (!) 117   Temp 100 F (37.8 C) (Rectal)   Resp 15   Ht 5' 8.5" (1.74 m)   Wt 98.9 kg   SpO2 94%   BMI 32.67 kg/m   Physical Exam Vitals signs and nursing note reviewed.  HENT:     Head: Normocephalic.  Cardiovascular:     Rate and Rhythm: Tachycardia present.  Pulmonary:     Breath sounds: Wheezing present.     Comments: Wheezes bilateral bases. Chest:     Chest wall: No tenderness.  Musculoskeletal:     Right lower leg: No edema.     Left lower leg: No edema.  Skin:    General: Skin is warm.     Capillary Refill: Capillary refill takes less than 2 seconds.  Neurological:     Mental Status: He is alert.      ED Treatments / Results  Labs (all labs ordered are listed, but only abnormal results are displayed) Labs Reviewed  COMPREHENSIVE METABOLIC PANEL - Abnormal; Notable for the following components:      Result Value   Glucose, Bld 139 (*)    All other components within normal limits  CBC WITH DIFFERENTIAL/PLATELET - Abnormal; Notable for the following components:   WBC 14.3 (*)    Neutro Abs 13.3 (*)    Lymphs Abs 0.6 (*)    All other  components within normal limits  BRAIN NATRIURETIC PEPTIDE - Abnormal; Notable for the following components:   B Natriuretic Peptide 446.8 (*)    All other components within normal limits  TROPONIN I - Abnormal; Notable for the following components:   Troponin I 0.03 (*)    All other components within normal limits  SARS CORONAVIRUS 2 (HOSPITAL ORDER, [facility])  CULTURE, BLOOD (ROUTINE X 2)  CULTURE, BLOOD (ROUTINE X 2)  LACTIC ACID, PLASMA  PROTIME-INR  URINALYSIS, ROUTINE W REFLEX MICROSCOPIC    EKG EKG Interpretation  Date/Time:  Wednesday July 10 2018 13:57:09 EDT Ventricular Rate:  122 PR Interval:    QRS Duration: 104 QT Interval:  423 QTC Calculation: 603 R Axis:   -44 Text Interpretation:  Sinus tachycardia Baseline wander in lead(s) II III aVF V1 V3 V4 V5 V6 Confirmed by Davonna Belling [phone]) on 07/10/2018 2:33:38 PM   Radiology Dg Chest Portable 1 View  Result Date: 07/10/2018 CLINICAL DATA:  Shortness of breath, cough, recent interaction with COVID-19 positive individuals, diabetes mellitus, COPD EXAM: PORTABLE CHEST 1 VIEW COMPARISON:  Portable exam 1438 hours compared to 05/26/2018 FINDINGS: Upper normal heart size. Mediastinal contours and pulmonary vascularity normal. Lungs clear. No infiltrate, pleural effusion or pneumothorax. Bones unremarkable. IMPRESSION: No acute abnormalities. Electronically Signed   By: Lavonia Dana M.D.   On: 07/10/2018 15:28    Procedures Procedures (including critical care time)  Medications Ordered in ED Medications  oxyCODONE-acetaminophen (PERCOCET/ROXICET) 5-325 MG per tablet 2 tablet (2 tablets Oral Given 07/10/18 1433)     Initial Impression / Assessment and Plan / ED Course  I have reviewed the triage vital signs and the nursing notes.  Pertinent labs & imaging results that were available during my care of the patient were reviewed by me and considered in my medical decision making (see chart  for details).        Patient presents with shortness of breath.  New increase in oxygenation requirement.  Has oxygen at home however.  Wheezing on exam.  BNP is elevated but x-ray does not show edema.  Negative COVID test.  Troponin mildly elevated.  Mild tachycardia.  Will admit to hospitalist  Patient reportedly left AMA before admission to hospital.  Final Clinical Impressions(s) / ED Diagnoses   Final diagnoses:  COPD exacerbation [facility])    ED Discharge Orders    None       Davonna Belling, MD 07/10/18 1755    Davonna Belling, MD 07/10/18 [phone]

## 2018-07-10 NOTE — Telephone Encounter (Signed)
This patient sounds like he is acutely ill with worsening shortness of breath and weight gain of almost 20 pounds in the last 2 weeks, despite outpatient therapy at his PCP office. I am concerned he may have heart failure as a contributing factor to his shortness of breath.   He has documented saturations of below 88%, and he is using his nebulizer treatments 5-6 times daily.  He has  had out patient DEPO injection  and pred taper and antibiotic therapy.  He has failed outpatient therapy.   He has COVID testing pending  He needs to go to the ED for a higher level of care. I am concerned he has heart failure and waiting until tomorrow for a CXR is not in his best interest.   I would recommend he call 911 for transportation to the ED , and present for a higher level of care. Make sure he lets EMS know he is experiencing shortness of breath and has decreased sense of smell and taste, and that he has COVID test pending so they can all use appropriate PPE.   Thank you

## 2018-07-10 NOTE — ED Notes (Signed)
Bed: WA18 Expected date:  Expected time:  Means of arrival:  Comments: Neg Pressure  

## 2018-07-11 DIAGNOSIS — Z79899 Other long term (current) drug therapy: Secondary | ICD-10-CM | POA: Diagnosis not present

## 2018-07-11 DIAGNOSIS — M542 Cervicalgia: Secondary | ICD-10-CM | POA: Diagnosis not present

## 2018-07-12 DIAGNOSIS — E7800 Pure hypercholesterolemia, unspecified: Secondary | ICD-10-CM | POA: Diagnosis not present

## 2018-07-12 DIAGNOSIS — J42 Unspecified chronic bronchitis: Secondary | ICD-10-CM | POA: Diagnosis not present

## 2018-07-12 DIAGNOSIS — F17210 Nicotine dependence, cigarettes, uncomplicated: Secondary | ICD-10-CM | POA: Diagnosis not present

## 2018-07-12 DIAGNOSIS — J209 Acute bronchitis, unspecified: Secondary | ICD-10-CM | POA: Diagnosis not present

## 2018-07-15 DIAGNOSIS — F1210 Cannabis abuse, uncomplicated: Secondary | ICD-10-CM | POA: Diagnosis not present

## 2018-07-15 DIAGNOSIS — F1110 Opioid abuse, uncomplicated: Secondary | ICD-10-CM | POA: Diagnosis not present

## 2018-07-15 LAB — CULTURE, BLOOD (ROUTINE X 2)
Culture: NO GROWTH
Culture: NO GROWTH

## 2018-07-16 DIAGNOSIS — F1210 Cannabis abuse, uncomplicated: Secondary | ICD-10-CM | POA: Diagnosis not present

## 2018-07-16 DIAGNOSIS — F1110 Opioid abuse, uncomplicated: Secondary | ICD-10-CM | POA: Diagnosis not present

## 2018-07-22 NOTE — Telephone Encounter (Signed)
Opened in error

## 2018-07-23 DIAGNOSIS — F1110 Opioid abuse, uncomplicated: Secondary | ICD-10-CM | POA: Diagnosis not present

## 2018-07-23 DIAGNOSIS — F1210 Cannabis abuse, uncomplicated: Secondary | ICD-10-CM | POA: Diagnosis not present

## 2018-07-25 DIAGNOSIS — F1110 Opioid abuse, uncomplicated: Secondary | ICD-10-CM | POA: Diagnosis not present

## 2018-07-25 DIAGNOSIS — F1210 Cannabis abuse, uncomplicated: Secondary | ICD-10-CM | POA: Diagnosis not present

## 2018-07-30 DIAGNOSIS — F1110 Opioid abuse, uncomplicated: Secondary | ICD-10-CM | POA: Diagnosis not present

## 2018-07-30 DIAGNOSIS — F1210 Cannabis abuse, uncomplicated: Secondary | ICD-10-CM | POA: Diagnosis not present

## 2018-07-31 ENCOUNTER — Other Ambulatory Visit: Payer: Self-pay | Admitting: Family Medicine

## 2018-08-01 DIAGNOSIS — F1110 Opioid abuse, uncomplicated: Secondary | ICD-10-CM | POA: Diagnosis not present

## 2018-08-01 DIAGNOSIS — F1210 Cannabis abuse, uncomplicated: Secondary | ICD-10-CM | POA: Diagnosis not present

## 2018-08-02 DIAGNOSIS — E669 Obesity, unspecified: Secondary | ICD-10-CM | POA: Diagnosis not present

## 2018-08-02 DIAGNOSIS — J449 Chronic obstructive pulmonary disease, unspecified: Secondary | ICD-10-CM | POA: Diagnosis not present

## 2018-08-02 DIAGNOSIS — F17210 Nicotine dependence, cigarettes, uncomplicated: Secondary | ICD-10-CM | POA: Diagnosis not present

## 2018-08-06 DIAGNOSIS — F1210 Cannabis abuse, uncomplicated: Secondary | ICD-10-CM | POA: Diagnosis not present

## 2018-08-06 DIAGNOSIS — F1110 Opioid abuse, uncomplicated: Secondary | ICD-10-CM | POA: Diagnosis not present

## 2018-08-07 DIAGNOSIS — F411 Generalized anxiety disorder: Secondary | ICD-10-CM | POA: Diagnosis not present

## 2018-08-07 DIAGNOSIS — F329 Major depressive disorder, single episode, unspecified: Secondary | ICD-10-CM | POA: Diagnosis not present

## 2018-08-07 DIAGNOSIS — F1290 Cannabis use, unspecified, uncomplicated: Secondary | ICD-10-CM | POA: Diagnosis not present

## 2018-08-08 DIAGNOSIS — M2012 Hallux valgus (acquired), left foot: Secondary | ICD-10-CM | POA: Diagnosis not present

## 2018-08-08 DIAGNOSIS — M2142 Flat foot [pes planus] (acquired), left foot: Secondary | ICD-10-CM | POA: Diagnosis not present

## 2018-08-08 DIAGNOSIS — F1110 Opioid abuse, uncomplicated: Secondary | ICD-10-CM | POA: Diagnosis not present

## 2018-08-08 DIAGNOSIS — E1140 Type 2 diabetes mellitus with diabetic neuropathy, unspecified: Secondary | ICD-10-CM | POA: Diagnosis not present

## 2018-08-08 DIAGNOSIS — M2011 Hallux valgus (acquired), right foot: Secondary | ICD-10-CM | POA: Diagnosis not present

## 2018-08-08 DIAGNOSIS — F1210 Cannabis abuse, uncomplicated: Secondary | ICD-10-CM | POA: Diagnosis not present

## 2018-08-09 DIAGNOSIS — M545 Low back pain: Secondary | ICD-10-CM | POA: Diagnosis not present

## 2018-08-09 DIAGNOSIS — Z79899 Other long term (current) drug therapy: Secondary | ICD-10-CM | POA: Diagnosis not present

## 2018-08-09 DIAGNOSIS — M542 Cervicalgia: Secondary | ICD-10-CM | POA: Diagnosis not present

## 2018-08-09 DIAGNOSIS — M546 Pain in thoracic spine: Secondary | ICD-10-CM | POA: Diagnosis not present

## 2018-08-13 DIAGNOSIS — F1210 Cannabis abuse, uncomplicated: Secondary | ICD-10-CM | POA: Diagnosis not present

## 2018-08-13 DIAGNOSIS — F1110 Opioid abuse, uncomplicated: Secondary | ICD-10-CM | POA: Diagnosis not present

## 2018-08-15 DIAGNOSIS — F1210 Cannabis abuse, uncomplicated: Secondary | ICD-10-CM | POA: Diagnosis not present

## 2018-08-15 DIAGNOSIS — F1110 Opioid abuse, uncomplicated: Secondary | ICD-10-CM | POA: Diagnosis not present

## 2018-08-29 ENCOUNTER — Other Ambulatory Visit: Payer: Self-pay | Admitting: Family Medicine

## 2018-08-29 NOTE — Telephone Encounter (Signed)
Forwarding medication refill requests to clinical pool for review. 

## 2018-09-02 DIAGNOSIS — M542 Cervicalgia: Secondary | ICD-10-CM | POA: Diagnosis not present

## 2018-09-02 DIAGNOSIS — M545 Low back pain: Secondary | ICD-10-CM | POA: Diagnosis not present

## 2018-09-03 NOTE — Progress Notes (Deleted)
Cardiology Office Note   Date:  09/03/2018   ID:  Scott [name], DOB [birth year], MRN [id number]  PCP:  Fredrich Romans, [place]  Cardiologist:   Minus Breeding, MD Referring:  ***  No chief complaint on file.     History of Present Illness: Scott [name] is a 58 y.o. male who presents for evaluation of tachycardia.  He has no past cardiac history.  He was in the hospital recently with AMS.  He was thought to have an accidental drug overdose using pain meds with alcohol.  He sounds like he is limited by back pain.  He wore a Holter in June and he had no significant arrhythmias.  ***   He does not do a lot of activities.  He does not usually have presyncope or syncope.  He said that he has had tachycardia.  I actually reviewed an EKG from 4 22 when he had sinus tachycardia.  I see lots of vital signs and he has a rapid heart rate.  I was able to review labs and has had normal TSH this year and is not anemic.  He has not described chest pressure, neck or arm discomfort.  He has chronic COPD and he sleeps with oxygen.  He thinks he has been slightly more short of breath slowly over time.  He is not describing PND or orthopnea however.   Past Medical History:  Diagnosis Date  . 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 COPD (chronic obstructive pulmonary disease) ([employer])   . Diabetes mellitus without complication ([place])   . GERD (gastroesophageal reflux disease)   . Headache(784.0)   . Pneumonia    hx    Past Surgical History:  Procedure Laterality Date  . ANTERIOR CERVICAL DECOMP/DISCECTOMY FUSION  12/22/2010   Procedure: ANTERIOR CERVICAL DECOMPRESSION/DISCECTOMY FUSION 3 LEVELS;  Surgeon: Ophelia Charter;  Location: [facility];  Service: Neurosurgery;  Laterality: N/A;  Anterior cervical discectomy with fusion cervical three-four, four-five, and five sixCDF with Interbody Prosthesis, plating, and Bone Graft   . ANTERIOR CERVICAL DECOMP/DISCECTOMY FUSION N/A  10/16/2012   Procedure: CERVICAL SIX-SEVEN ANTERIOR CERVICAL DECOMPRESSION/DISCECTOMY FUSION WITH INTERBODY PROTHESIS PLATING BONEGRAFT WITH /POSSIBLE HARDWARE REMOVAL OLD PLATE;  Surgeon: Ophelia Charter, MD;  Location: [facility];  Service: Neurosurgery;  Laterality: N/A;  . MULTIPLE TOOTH EXTRACTIONS    . OTHER SURGICAL HISTORY     surgery on cheekbone and head for fall 2000  . 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 [facility]     2014 and 2016 - Dr JArnoldo Morale . TONSILLECTOMY       Current Outpatient Medications  Medication Sig Dispense Refill  . albuterol (VENTOLIN HFA) 108 (90 Base) MCG/ACT inhaler Inhale 1-2 puffs into the lungs every 6 (six) hours as needed (wheezing or shortness of breath). 18 g 5  . BELBUCA 75 MCG FILM     . COMBIVENT RESPIMAT 20-100 MCG/ACT AERS respimat INHALE 1 PUFF BY MOUTH INTO THE LUNGS FOUR TIMES A DAY (Patient taking differently: Inhale 2 puffs into the lungs 4 (four) times daily. ) 1 Inhaler 5  . Fluticasone-Salmeterol (ADVAIR) 250-50 MCG/DOSE AEPB Inhale 1 puff into the lungs 2 (two) times daily. 60 each 5  . gabapentin (NEURONTIN) 300 MG capsule TAKE 1 CAPSULE BY MOUTH 3 TIMES  DAILY 90 capsule 0  . hydrochlorothiazide (HYDRODIURIL) 12.5 MG tablet Take 1 tablet (12.5 mg total) by mouth daily. 90 tablet 3  . ipratropium-albuterol (DUONEB) 0.5-2.5 (3) MG/3ML SOLN INHALE THE CONTENTS OF 1 VIAL VIA NEBULIZER EVERY 6 HOURS AS DIRECTED FOR SHORTNESS OF BREATH OR WHEEZING (Patient taking differently: Inhale 3 mLs into the lungs 4 (four) times daily. INHALE THE CONTENTS OF 1 VIAL VIA NEBULIZER EVERY 6 HOURS AS DIRECTED FOR SHORTNESS OF BREATH OR WHEEZING) 360 mL 5  . JANUMET 50-500 MG tablet TAKE 1 TABLET BY MOUTH ONCE A DAY 90 tablet 0  . NARCAN 4 MG/0.1ML LIQD nasal spray kit     . nicotine polacrilex (NICORETTE) 2 MG gum CHEW 1 PIECE BY MOUTH AS NEEDED FOR  SMOKING CESSATION 100 each 2  . nystatin (MYCOSTATIN) 100000 UNIT/ML suspension Take 5 mLs (500,000 Units total) by mouth 4 (four) times daily. Swish and swallow 120 mL 1  . omeprazole (PRILOSEC) 40 MG capsule TAKE 1 CAPSULE BY MOUTH EVERY DAY 30 capsule 0  . oxyCODONE-acetaminophen (PERCOCET) 10-325 MG tablet Take 1 tablet by mouth every 4 (four) hours. 15 tablet 0  . rosuvastatin (CRESTOR) 20 MG tablet TAKE 1/2 TABLET (10 MG TOTAL) BY MOUTH DAILY. 90 tablet 0  . Vitamin D, Ergocalciferol, (DRISDOL) 1.25 MG (50000 UT) CAPS capsule TAKE ONE CAPSULE BY MOUTH ONCE WEEKLY 12 capsule 0   No current facility-administered medications for this visit.     Allergies:   Penicillins and Cymbalta [duloxetine hcl]    ROS:  Please see the history of present illness.   Otherwise, review of systems are positive for {NONE DEFAULTED:18576::"none"}.   All other systems are reviewed and negative.    PHYSICAL EXAM: VS:  There were no vitals taken for this visit. , BMI There is no height or weight on file to calculate BMI. GENERAL:  Well appearing NECK:  No jugular venous distention, waveform within normal limits, carotid upstroke brisk and symmetric, no bruits, no thyromegaly LUNGS:  Clear to auscultation bilaterally CHEST:  Unremarkable HEART:  PMI not displaced or sustained,S1 and S2 within normal limits, no S3, no S4, no clicks, no rubs, *** murmurs ABD:  Flat, positive bowel sounds normal in frequency in pitch, no bruits, no rebound, no guarding, no midline pulsatile mass, no hepatomegaly, no splenomegaly EXT:  2 plus pulses throughout, no edema, no cyanosis no clubbing   EKG:  EKG {ACTION; IS/IS XQJ:[id number]} ordered today. The ekg ordered today demonstrates ***   Recent Labs: 03/01/2018: TSH 1.110 04/05/2018: Magnesium 2.0 07/10/2018: ALT 16; B Natriuretic Peptide 446.8; BUN 17; Creatinine, Ser 0.95; Hemoglobin 14.4; Platelets 249; Potassium 4.0; Sodium 141    Lipid Panel    Component Value  Date/Time   CHOL 161 03/01/2018 0958   TRIG 140 05/26/2018 1209   HDL 40 03/01/2018 0958   CHOLHDL 4.0 03/01/2018 0958   LDLCALC 89 03/01/2018 0958      Wt Readings from Last 3 Encounters:  07/10/18 218 lb 0.6 oz (98.9 kg)  05/26/18 218 lb 1.6 oz (98.9 kg)  05/24/18 215 lb (97.5 kg)      Other studies Reviewed: Additional studies/ records that were reviewed today include: ***. Review of the above records demonstrates:  Please see elsewhere in the note.  ***   ASSESSMENT AND PLAN:   TACHYCARDIA:  He had no significant arrhythmias.  ***  I am going to start with a 48-hour Holter monitor.  I will have a low threshold for echocardiography but  probably would like to see him back in the office first.  I will arrange to see him after the monitor.  DM:   ***   A1C is 6.1.  No change in therapy.    Current medicines are reviewed at length with the patient today.  The patient {ACTIONS; HAS/DOES NOT HAVE:19233} concerns regarding medicines.  The following changes have been made:  {PLAN; NO CHANGE:13088:s}  Labs/ tests ordered today include: *** No orders of the defined types were placed in this encounter.    Disposition:   FU with ***    Signed, Minus Breeding, MD  09/03/2018 5:46 PM    [facility]

## 2018-09-03 NOTE — Telephone Encounter (Signed)
Requesting refill of medication to be filled by MD only 

## 2018-09-04 ENCOUNTER — Ambulatory Visit: Payer: Medicare HMO | Admitting: Cardiology

## 2018-09-04 DIAGNOSIS — I739 Peripheral vascular disease, unspecified: Secondary | ICD-10-CM | POA: Diagnosis not present

## 2018-09-04 DIAGNOSIS — R9431 Abnormal electrocardiogram [ECG] [EKG]: Secondary | ICD-10-CM | POA: Diagnosis not present

## 2018-09-04 DIAGNOSIS — R0989 Other specified symptoms and signs involving the circulatory and respiratory systems: Secondary | ICD-10-CM | POA: Diagnosis not present

## 2018-09-05 DIAGNOSIS — F329 Major depressive disorder, single episode, unspecified: Secondary | ICD-10-CM | POA: Diagnosis not present

## 2018-09-05 DIAGNOSIS — F411 Generalized anxiety disorder: Secondary | ICD-10-CM | POA: Diagnosis not present

## 2018-09-06 DIAGNOSIS — E559 Vitamin D deficiency, unspecified: Secondary | ICD-10-CM | POA: Diagnosis not present

## 2018-09-06 DIAGNOSIS — Z1159 Encounter for screening for other viral diseases: Secondary | ICD-10-CM | POA: Diagnosis not present

## 2018-09-06 DIAGNOSIS — M129 Arthropathy, unspecified: Secondary | ICD-10-CM | POA: Diagnosis not present

## 2018-09-06 DIAGNOSIS — M542 Cervicalgia: Secondary | ICD-10-CM | POA: Diagnosis not present

## 2018-09-06 DIAGNOSIS — F17210 Nicotine dependence, cigarettes, uncomplicated: Secondary | ICD-10-CM | POA: Diagnosis not present

## 2018-09-06 DIAGNOSIS — Z79899 Other long term (current) drug therapy: Secondary | ICD-10-CM | POA: Diagnosis not present

## 2018-09-18 DIAGNOSIS — R0602 Shortness of breath: Secondary | ICD-10-CM | POA: Diagnosis not present

## 2018-09-20 ENCOUNTER — Inpatient Hospital Stay (HOSPITAL_COMMUNITY)
Admission: EM | Admit: 2018-09-20 | Discharge: 2018-09-24 | DRG: 286 | Disposition: A | Discharge status: Home / Self Care | Payer: Medicare HMO | Attending: Internal Medicine | Admitting: Internal Medicine

## 2018-09-20 ENCOUNTER — Inpatient Hospital Stay (HOSPITAL_COMMUNITY): Payer: Medicare HMO

## 2018-09-20 ENCOUNTER — Other Ambulatory Visit: Payer: Self-pay

## 2018-09-20 ENCOUNTER — Emergency Department (HOSPITAL_COMMUNITY): Payer: Medicare HMO

## 2018-09-20 ENCOUNTER — Encounter (HOSPITAL_COMMUNITY): Payer: Self-pay

## 2018-09-20 DIAGNOSIS — Z8349 Family history of other endocrine, nutritional and metabolic diseases: Secondary | ICD-10-CM

## 2018-09-20 DIAGNOSIS — I700 Atherosclerosis of aorta: Secondary | ICD-10-CM | POA: Diagnosis present

## 2018-09-20 DIAGNOSIS — Z87892 Personal history of anaphylaxis: Secondary | ICD-10-CM

## 2018-09-20 DIAGNOSIS — E669 Obesity, unspecified: Secondary | ICD-10-CM | POA: Diagnosis present

## 2018-09-20 DIAGNOSIS — Z6833 Body mass index (BMI) 33.0-33.9, adult: Secondary | ICD-10-CM | POA: Diagnosis not present

## 2018-09-20 DIAGNOSIS — I509 Heart failure, unspecified: Secondary | ICD-10-CM

## 2018-09-20 DIAGNOSIS — Z79899 Other long term (current) drug therapy: Secondary | ICD-10-CM

## 2018-09-20 DIAGNOSIS — Z87891 Personal history of nicotine dependence: Secondary | ICD-10-CM

## 2018-09-20 DIAGNOSIS — Z833 Family history of diabetes mellitus: Secondary | ICD-10-CM

## 2018-09-20 DIAGNOSIS — Z8701 Personal history of pneumonia (recurrent): Secondary | ICD-10-CM

## 2018-09-20 DIAGNOSIS — G894 Chronic pain syndrome: Secondary | ICD-10-CM | POA: Diagnosis present

## 2018-09-20 DIAGNOSIS — K08409 Partial loss of teeth, unspecified cause, unspecified class: Secondary | ICD-10-CM | POA: Diagnosis present

## 2018-09-20 DIAGNOSIS — K219 Gastro-esophageal reflux disease without esophagitis: Secondary | ICD-10-CM | POA: Diagnosis present

## 2018-09-20 DIAGNOSIS — Z8619 Personal history of other infectious and parasitic diseases: Secondary | ICD-10-CM | POA: Diagnosis not present

## 2018-09-20 DIAGNOSIS — Z209 Contact with and (suspected) exposure to unspecified communicable disease: Secondary | ICD-10-CM | POA: Diagnosis not present

## 2018-09-20 DIAGNOSIS — J9601 Acute respiratory failure with hypoxia: Secondary | ICD-10-CM | POA: Diagnosis present

## 2018-09-20 DIAGNOSIS — J440 Chronic obstructive pulmonary disease with acute lower respiratory infection: Secondary | ICD-10-CM | POA: Diagnosis present

## 2018-09-20 DIAGNOSIS — I5023 Acute on chronic systolic (congestive) heart failure: Secondary | ICD-10-CM | POA: Diagnosis not present

## 2018-09-20 DIAGNOSIS — E119 Type 2 diabetes mellitus without complications: Secondary | ICD-10-CM | POA: Diagnosis present

## 2018-09-20 DIAGNOSIS — E1169 Type 2 diabetes mellitus with other specified complication: Secondary | ICD-10-CM | POA: Diagnosis not present

## 2018-09-20 DIAGNOSIS — J189 Pneumonia, unspecified organism: Secondary | ICD-10-CM | POA: Diagnosis not present

## 2018-09-20 DIAGNOSIS — Z20828 Contact with and (suspected) exposure to other viral communicable diseases: Secondary | ICD-10-CM | POA: Diagnosis present

## 2018-09-20 DIAGNOSIS — G8929 Other chronic pain: Secondary | ICD-10-CM | POA: Diagnosis present

## 2018-09-20 DIAGNOSIS — Z888 Allergy status to other drugs, medicaments and biological substances status: Secondary | ICD-10-CM

## 2018-09-20 DIAGNOSIS — Z7982 Long term (current) use of aspirin: Secondary | ICD-10-CM | POA: Diagnosis not present

## 2018-09-20 DIAGNOSIS — I110 Hypertensive heart disease with heart failure: Secondary | ICD-10-CM | POA: Diagnosis not present

## 2018-09-20 DIAGNOSIS — E7800 Pure hypercholesterolemia, unspecified: Secondary | ICD-10-CM | POA: Diagnosis not present

## 2018-09-20 DIAGNOSIS — R062 Wheezing: Secondary | ICD-10-CM | POA: Diagnosis not present

## 2018-09-20 DIAGNOSIS — I340 Nonrheumatic mitral (valve) insufficiency: Secondary | ICD-10-CM

## 2018-09-20 DIAGNOSIS — M4712 Other spondylosis with myelopathy, cervical region: Secondary | ICD-10-CM | POA: Diagnosis not present

## 2018-09-20 DIAGNOSIS — J441 Chronic obstructive pulmonary disease with (acute) exacerbation: Secondary | ICD-10-CM | POA: Diagnosis not present

## 2018-09-20 DIAGNOSIS — Z880 Allergy status to penicillin: Secondary | ICD-10-CM

## 2018-09-20 DIAGNOSIS — Z8249 Family history of ischemic heart disease and other diseases of the circulatory system: Secondary | ICD-10-CM

## 2018-09-20 DIAGNOSIS — Z7951 Long term (current) use of inhaled steroids: Secondary | ICD-10-CM

## 2018-09-20 DIAGNOSIS — R0902 Hypoxemia: Secondary | ICD-10-CM | POA: Diagnosis not present

## 2018-09-20 DIAGNOSIS — R0602 Shortness of breath: Secondary | ICD-10-CM | POA: Diagnosis not present

## 2018-09-20 DIAGNOSIS — Z981 Arthrodesis status: Secondary | ICD-10-CM | POA: Diagnosis not present

## 2018-09-20 DIAGNOSIS — I428 Other cardiomyopathies: Secondary | ICD-10-CM | POA: Diagnosis not present

## 2018-09-20 DIAGNOSIS — Z825 Family history of asthma and other chronic lower respiratory diseases: Secondary | ICD-10-CM

## 2018-09-20 DIAGNOSIS — E785 Hyperlipidemia, unspecified: Secondary | ICD-10-CM | POA: Diagnosis present

## 2018-09-20 DIAGNOSIS — M961 Postlaminectomy syndrome, not elsewhere classified: Secondary | ICD-10-CM | POA: Diagnosis present

## 2018-09-20 DIAGNOSIS — I429 Cardiomyopathy, unspecified: Secondary | ICD-10-CM | POA: Diagnosis not present

## 2018-09-20 DIAGNOSIS — Z79891 Long term (current) use of opiate analgesic: Secondary | ICD-10-CM | POA: Diagnosis not present

## 2018-09-20 DIAGNOSIS — R0689 Other abnormalities of breathing: Secondary | ICD-10-CM | POA: Diagnosis not present

## 2018-09-20 DIAGNOSIS — I472 Ventricular tachycardia: Secondary | ICD-10-CM | POA: Diagnosis not present

## 2018-09-20 DIAGNOSIS — I10 Essential (primary) hypertension: Secondary | ICD-10-CM | POA: Diagnosis not present

## 2018-09-20 LAB — CBC WITH DIFFERENTIAL/PLATELET
Abs Immature Granulocytes: 0.01 10*3/uL (ref 0.00–0.07)
Basophils Absolute: 0 10*3/uL (ref 0.0–0.1)
Basophils Relative: 1 %
Eosinophils Absolute: 0.1 10*3/uL (ref 0.0–0.5)
Eosinophils Relative: 1 %
HCT: 47.4 % (ref 39.0–52.0)
Hemoglobin: 14.6 g/dL (ref 13.0–17.0)
Immature Granulocytes: 0 %
Lymphocytes Relative: 21 %
Lymphs Abs: 1.1 10*3/uL (ref 0.7–4.0)
MCH: 30.3 pg (ref 26.0–34.0)
MCHC: 30.8 g/dL (ref 30.0–36.0)
MCV: 98.3 fL (ref 80.0–100.0)
Monocytes Absolute: 0.3 10*3/uL (ref 0.1–1.0)
Monocytes Relative: 6 %
Neutro Abs: 3.7 10*3/uL (ref 1.7–7.7)
Neutrophils Relative %: 71 %
Platelets: 260 10*3/uL (ref 150–400)
RBC: 4.82 MIL/uL (ref 4.22–5.81)
RDW: 16.1 % — ABNORMAL HIGH (ref 11.5–15.5)
WBC: 5.3 10*3/uL (ref 4.0–10.5)
nRBC: 0 % (ref 0.0–0.2)

## 2018-09-20 LAB — HEMOGLOBIN A1C
Hgb A1c MFr Bld: 6.4 % — ABNORMAL HIGH (ref 4.8–5.6)
Mean Plasma Glucose: 136.98 mg/dL

## 2018-09-20 LAB — ECHOCARDIOGRAM COMPLETE
Height: 68 in
Weight: 3536 oz

## 2018-09-20 LAB — COMPREHENSIVE METABOLIC PANEL
ALT: 23 U/L (ref 0–44)
AST: 36 U/L (ref 15–41)
Albumin: 3.8 g/dL (ref 3.5–5.0)
Alkaline Phosphatase: 74 U/L (ref 38–126)
Anion gap: 8 (ref 5–15)
BUN: 11 mg/dL (ref 6–20)
CO2: 29 mmol/L (ref 22–32)
Calcium: 8.5 mg/dL — ABNORMAL LOW (ref 8.9–10.3)
Chloride: 105 mmol/L (ref 98–111)
Creatinine, Ser: 1.09 mg/dL (ref 0.61–1.24)
GFR calc Af Amer: >60 mL/min (ref >60)
GFR calc non Af Amer: >60 mL/min (ref >60)
Glucose, Bld: 136 mg/dL — ABNORMAL HIGH (ref 70–99)
Potassium: 4.8 mmol/L (ref 3.5–5.1)
Sodium: 142 mmol/L (ref 135–145)
Total Bilirubin: 1.7 mg/dL — ABNORMAL HIGH (ref 0.3–1.2)
Total Protein: 6.9 g/dL (ref 6.5–8.1)

## 2018-09-20 LAB — BRAIN NATRIURETIC PEPTIDE: B Natriuretic Peptide: 1680.8 pg/mL — ABNORMAL HIGH (ref 0.0–100.0)

## 2018-09-20 LAB — GLUCOSE, CAPILLARY
Glucose-Capillary: 152 mg/dL — ABNORMAL HIGH (ref 70–99)
Glucose-Capillary: 196 mg/dL — ABNORMAL HIGH (ref 70–99)

## 2018-09-20 LAB — SARS CORONAVIRUS 2 BY RT PCR (HOSPITAL ORDER, PERFORMED IN ~~LOC~~ HOSPITAL LAB): SARS Coronavirus 2: NEGATIVE

## 2018-09-20 LAB — TROPONIN I (HIGH SENSITIVITY)
Troponin I (High Sensitivity): 32 ng/L — ABNORMAL HIGH (ref <18)
Troponin I (High Sensitivity): 23 ng/L — ABNORMAL HIGH (ref <18)

## 2018-09-20 MED ORDER — OXYCODONE-ACETAMINOPHEN 5-325 MG PO TABS
1.0000 | ORAL_TABLET | ORAL | Status: DC
  Administered 2018-09-20 – 2018-09-24 (×24): 1 via ORAL
  Filled 2018-09-20 (×23): qty 1

## 2018-09-20 MED ORDER — ALBUTEROL SULFATE HFA 108 (90 BASE) MCG/ACT IN AERS
4.0000 | INHALATION_SPRAY | Freq: Once | RESPIRATORY_TRACT | Status: AC
  Administered 2018-09-20: 11:00:00 4 via RESPIRATORY_TRACT
  Filled 2018-09-20: qty 6.7

## 2018-09-20 MED ORDER — LEVOFLOXACIN IN D5W 750 MG/150ML IV SOLN
750.0000 mg | Freq: Once | INTRAVENOUS | Status: AC
  Administered 2018-09-20: 750 mg via INTRAVENOUS
  Filled 2018-09-20: qty 150

## 2018-09-20 MED ORDER — IPRATROPIUM BROMIDE HFA 17 MCG/ACT IN AERS
2.0000 | INHALATION_SPRAY | Freq: Once | RESPIRATORY_TRACT | Status: AC
  Administered 2018-09-20: 2 via RESPIRATORY_TRACT
  Filled 2018-09-20: qty 12.9

## 2018-09-20 MED ORDER — FUROSEMIDE 10 MG/ML IJ SOLN
40.0000 mg | Freq: Every day | INTRAMUSCULAR | Status: DC
  Administered 2018-09-20 – 2018-09-22 (×3): 40 mg via INTRAVENOUS
  Filled 2018-09-20 (×4): qty 4

## 2018-09-20 MED ORDER — IPRATROPIUM-ALBUTEROL 0.5-2.5 (3) MG/3ML IN SOLN
3.0000 mL | Freq: Four times a day (QID) | RESPIRATORY_TRACT | Status: DC
  Administered 2018-09-21 – 2018-09-24 (×12): 3 mL via RESPIRATORY_TRACT
  Filled 2018-09-20 (×13): qty 3

## 2018-09-20 MED ORDER — FLUOXETINE HCL 20 MG PO CAPS
20.0000 mg | ORAL_CAPSULE | Freq: Every day | ORAL | Status: DC
  Administered 2018-09-21 – 2018-09-24 (×4): 20 mg via ORAL
  Filled 2018-09-20 (×4): qty 1

## 2018-09-20 MED ORDER — INSULIN ASPART 100 UNIT/ML ~~LOC~~ SOLN
0.0000 [IU] | Freq: Three times a day (TID) | SUBCUTANEOUS | Status: DC
  Administered 2018-09-20: 2 [IU] via SUBCUTANEOUS
  Administered 2018-09-21 (×2): 1 [IU] via SUBCUTANEOUS
  Administered 2018-09-21: 2 [IU] via SUBCUTANEOUS
  Administered 2018-09-22: 3 [IU] via SUBCUTANEOUS
  Administered 2018-09-22: 5 [IU] via SUBCUTANEOUS
  Administered 2018-09-22 – 2018-09-24 (×4): 2 [IU] via SUBCUTANEOUS
  Filled 2018-09-20: qty 0.09

## 2018-09-20 MED ORDER — INSULIN ASPART 100 UNIT/ML ~~LOC~~ SOLN
0.0000 [IU] | Freq: Every day | SUBCUTANEOUS | Status: DC
  Filled 2018-09-20: qty 0.05

## 2018-09-20 MED ORDER — FUROSEMIDE 10 MG/ML IJ SOLN
40.0000 mg | Freq: Once | INTRAMUSCULAR | Status: AC
  Administered 2018-09-20: 12:00:00 40 mg via INTRAVENOUS
  Filled 2018-09-20: qty 4

## 2018-09-20 MED ORDER — CARVEDILOL 3.125 MG PO TABS
3.1250 mg | ORAL_TABLET | Freq: Two times a day (BID) | ORAL | Status: DC
  Administered 2018-09-20 – 2018-09-21 (×2): 3.125 mg via ORAL
  Filled 2018-09-20 (×2): qty 1

## 2018-09-20 MED ORDER — LEVOFLOXACIN 750 MG PO TABS
750.0000 mg | ORAL_TABLET | Freq: Every day | ORAL | Status: DC
  Administered 2018-09-21 – 2018-09-24 (×4): 750 mg via ORAL
  Filled 2018-09-20 (×4): qty 1

## 2018-09-20 MED ORDER — AEROCHAMBER PLUS FLO-VU MEDIUM MISC
1.0000 | Freq: Once | Status: AC
  Administered 2018-09-20: 1
  Filled 2018-09-20: qty 1

## 2018-09-20 MED ORDER — DIAZEPAM 5 MG PO TABS
5.0000 mg | ORAL_TABLET | Freq: Two times a day (BID) | ORAL | Status: DC | PRN
  Administered 2018-09-20 – 2018-09-24 (×9): 5 mg via ORAL
  Filled 2018-09-20 (×9): qty 1

## 2018-09-20 MED ORDER — ENOXAPARIN SODIUM 40 MG/0.4ML ~~LOC~~ SOLN
40.0000 mg | SUBCUTANEOUS | Status: DC
  Administered 2018-09-20 – 2018-09-22 (×3): 40 mg via SUBCUTANEOUS
  Filled 2018-09-20 (×3): qty 0.4

## 2018-09-20 MED ORDER — OXYCODONE HCL 5 MG PO TABS
5.0000 mg | ORAL_TABLET | ORAL | Status: DC
  Administered 2018-09-20 – 2018-09-24 (×24): 5 mg via ORAL
  Filled 2018-09-20 (×23): qty 1

## 2018-09-20 MED ORDER — MOMETASONE FURO-FORMOTEROL FUM 200-5 MCG/ACT IN AERO
2.0000 | INHALATION_SPRAY | Freq: Two times a day (BID) | RESPIRATORY_TRACT | Status: DC
  Administered 2018-09-21 – 2018-09-24 (×7): 2 via RESPIRATORY_TRACT
  Filled 2018-09-20: qty 8.8

## 2018-09-20 MED ORDER — IPRATROPIUM-ALBUTEROL 0.5-2.5 (3) MG/3ML IN SOLN
3.0000 mL | Freq: Four times a day (QID) | RESPIRATORY_TRACT | Status: DC
  Administered 2018-09-20: 3 mL via RESPIRATORY_TRACT
  Filled 2018-09-20: qty 3

## 2018-09-20 MED ORDER — NICOTINE 21 MG/24HR TD PT24
21.0000 mg | MEDICATED_PATCH | Freq: Every day | TRANSDERMAL | Status: DC
  Administered 2018-09-20 – 2018-09-24 (×6): 21 mg via TRANSDERMAL
  Filled 2018-09-20 (×7): qty 1

## 2018-09-20 MED ORDER — PANTOPRAZOLE SODIUM 40 MG PO TBEC
40.0000 mg | DELAYED_RELEASE_TABLET | Freq: Every day | ORAL | Status: DC
  Administered 2018-09-21 – 2018-09-24 (×4): 40 mg via ORAL
  Filled 2018-09-20 (×4): qty 1

## 2018-09-20 MED ORDER — METHYLPREDNISOLONE SODIUM SUCC 125 MG IJ SOLR
60.0000 mg | Freq: Three times a day (TID) | INTRAMUSCULAR | Status: DC
  Administered 2018-09-20 – 2018-09-23 (×9): 60 mg via INTRAVENOUS
  Filled 2018-09-20 (×10): qty 2

## 2018-09-20 MED ORDER — OXYCODONE-ACETAMINOPHEN 10-325 MG PO TABS: 1.0000 | ORAL_TABLET | ORAL | Status: DC

## 2018-09-20 MED ORDER — GABAPENTIN 300 MG PO CAPS
300.0000 mg | ORAL_CAPSULE | Freq: Three times a day (TID) | ORAL | Status: DC
  Administered 2018-09-20 – 2018-09-24 (×12): 300 mg via ORAL
  Filled 2018-09-20 (×12): qty 1

## 2018-09-20 NOTE — ED Notes (Signed)
ED TO INPATIENT HANDOFF REPORT  Name/Age/Gender Scott [name] 58 y.o. male  Code Status Code Status History    Date Active Date Inactive Code Status Order ID Comments User Context   05/26/2018 1848 05/28/2018 1627 Full Code [id number]  Alessandra BevelsKamineni, Neelima, MD ED   04/04/2018 1440 04/05/2018 1807 Full Code [id number]  Dimple NanasAmin, Ankit Chirag, MD ED   06/26/2013 1053 06/27/2013 0336 Full Code [id number]  Jolaine ClickHoss, Arthur, MD HOV   06/01/2013 0141 06/03/2013 1548 Full Code [id number]  Ron ParkerJenkins, Harvette C, MD Inpatient   10/16/2012 1649 10/17/2012 1314 Full Code [id number]  Tressie StalkerJenkins, Jeffrey, MD Inpatient   Advance Care Planning Activity      Home/SNF/Other Home  Chief Complaint copd exacerbation  Level of Care/Admitting Diagnosis ED Disposition    ED Disposition Condition Comment   Admit  [facility]ospital [facility] [[id number]]  Level of Care: Telemetry [5]  Admit to tele based on following criteria: Acute CHF  Covid Evaluation: Asymptomatic Screening Protocol (No Symptoms)  Diagnosis: Acute respiratory failure with hypoxia [facility](HCC) [[id number]]) [[id number]]  Admitting Physician: Kathlen ModyAKULA, VIJAYA [[id number]]  Attending Physician: Kathlen ModyAKULA, VIJAYA [[id number]]  Estimated length of stay: past midnight tomorrow  Certification:: I certify this patient will need inpatient services for at least 2 midnights  PT Class (Do Not Modify): Inpatient [101]  PT Acc Code (Do Not Modify): Private [1]       Medical History Past Medical History:  Diagnosis Date  . 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 COPD (chronic obstructive pulmonary disease) (HCC)   . Diabetes mellitus without complication (HCC)   . GERD (gastroesophageal reflux disease)   . Headache(784.0)   . Pneumonia    hx    Allergies Allergies  Allergen Reactions  . Penicillins Anaphylaxis    Has patient had a PCN reaction causing immediate rash, facial/tongue/throat swelling, SOB or lightheadedness with hypotension: Yes Has patient had a PCN  reaction causing severe rash involving mucus membranes or skin necrosis: Yes Has patient had a PCN reaction that required hospitalization: Yes Has patient had a PCN reaction occurring within the last 10 years: No If all of the above answers are "NO", then may proceed with Cephalosporin use.   Marland Kitchen. Cymbalta [Duloxetine Hcl]     "crazy thoughts"    IV Location/Drains/Wounds Patient Lines/Drains/Airways Status   Active Line/Drains/Airways    Name:   Placement date:   Placement time:   Site:   Days:   Peripheral IV 09/20/18 Left Antecubital   09/20/18    -    Antecubital   less than 1          Labs/Imaging Results for orders placed or performed during the hospital encounter of 09/20/18 (from the past 48 hour(s))  Comprehensive metabolic panel     Status: Abnormal   Collection Time: 09/20/18 10:40 AM  Result Value Ref Range   Sodium 142 135 - 145 mmol/L   Potassium 4.8 3.5 - 5.1 mmol/L   Chloride 105 98 - 111 mmol/L   CO2 29 22 - 32 mmol/L   Glucose, Bld 136 (H) 70 - 99 mg/dL   BUN 11 6 - 20 mg/dL   Creatinine, Ser 5.951.09 0.61 - 1.24 mg/dL   Calcium 8.5 (L) 8.9 - 10.3 mg/dL   Total Protein 6.9 6.5 - 8.1 g/dL   Albumin 3.8 3.5 - 5.0 g/dL   AST 36 15 - 41 U/L   ALT 23 0 - 44 U/L   Alkaline  Phosphatase 74 38 - 126 U/L   Total Bilirubin 1.7 (H) 0.3 - 1.2 mg/dL   GFR calc non Af Amer >60 >60 mL/min   GFR calc Af Amer >60 >60 mL/min   Anion gap 8 5 - 15    Comment: Performed at [facility], [address] [place], [place] [address]  CBC with Differential     Status: Abnormal   Collection Time: 09/20/18 10:40 AM  Result Value Ref Range   WBC 5.3 4.0 - 10.5 K/uL   RBC 4.82 4.22 - 5.81 MIL/uL   Hemoglobin 14.6 13.0 - 17.0 g/dL   HCT 60.447.4 54.039.0 - 98.152.0 %   MCV 98.3 80.0 - 100.0 fL   MCH 30.3 26.0 - 34.0 pg   MCHC 30.8 30.0 - 36.0 g/dL   RDW 19.116.1 (H) 47.811.5 - 29.515.5 %   Platelets 260 150 - 400 K/uL   nRBC 0.0 0.0 - 0.2 %   Neutrophils Relative % 71 %   Neutro Abs 3.7  1.7 - 7.7 K/uL   Lymphocytes Relative 21 %   Lymphs Abs 1.1 0.7 - 4.0 K/uL   Monocytes Relative 6 %   Monocytes Absolute 0.3 0.1 - 1.0 K/uL   Eosinophils Relative 1 %   Eosinophils Absolute 0.1 0.0 - 0.5 K/uL   Basophils Relative 1 %   Basophils Absolute 0.0 0.0 - 0.1 K/uL   Immature Granulocytes 0 %   Abs Immature Granulocytes 0.01 0.00 - 0.07 K/uL    Comment: Performed at [facility], [address] [place], [place] [address]  Brain natriuretic peptide     Status: Abnormal   Collection Time: 09/20/18 10:40 AM  Result Value Ref Range   B Natriuretic Peptide 1,680.8 (H) 0.0 - 100.0 pg/mL    Comment: Performed at [facility], [address] [place], [place] [address]  Troponin I (High Sensitivity)     Status: Abnormal   Collection Time: 09/20/18 10:40 AM  Result Value Ref Range   Troponin I (High Sensitivity) 32 (H) <18 ng/L    Comment: (NOTE) Elevated high sensitivity troponin I (hsTnI) values and significant  changes across serial measurements may suggest ACS but many other  chronic and acute conditions are known to elevate hsTnI results.  Refer to the "Links" section for chest pain algorithms and additional  guidance. Performed at [facility], [address] [place], [place] [address]   SARS Coronavirus 2 [facility](Hospital order, Performed in [facility] hospital lab) Nasopharyngeal Nasopharyngeal Swab     Status: None   Collection Time: 09/20/18 10:40 AM   Specimen: Nasopharyngeal Swab  Result Value Ref Range   SARS Coronavirus 2 NEGATIVE NEGATIVE    Comment: (NOTE) If result is NEGATIVE SARS-CoV-2 target nucleic acids are NOT DETECTED. The SARS-CoV-2 RNA is generally detectable in upper and lower  respiratory specimens during the acute phase of infection. The lowest  concentration of SARS-CoV-2 viral copies this assay can detect is 250  copies / mL. A negative result does not preclude SARS-CoV-2 infection  and should  not be used as the sole basis for treatment or other  patient management decisions.  A negative result may occur with  improper specimen collection / handling, submission of specimen other  than nasopharyngeal swab, presence of viral mutation(s) within the  areas targeted by this assay, and inadequate number of viral copies  (<250 copies / mL). A negative result must be combined with clinical  observations, patient  history, and epidemiological information. If result is POSITIVE SARS-CoV-2 target nucleic acids are DETECTED. The SARS-CoV-2 RNA is generally detectable in upper and lower  respiratory specimens dur ing the acute phase of infection.  Positive  results are indicative of active infection with SARS-CoV-2.  Clinical  correlation with patient history and other diagnostic information is  necessary to determine patient infection status.  Positive results do  not rule out bacterial infection or co-infection with other viruses. If result is PRESUMPTIVE POSTIVE SARS-CoV-2 nucleic acids MAY BE PRESENT.   A presumptive positive result was obtained on the submitted specimen  and confirmed on repeat testing.  While 2019 novel coronavirus  (SARS-CoV-2) nucleic acids may be present in the submitted sample  additional confirmatory testing may be necessary for epidemiological  and / or clinical management purposes  to differentiate between  SARS-CoV-2 and other Sarbecovirus currently known to infect humans.  If clinically indicated additional testing with an alternate test  methodology [phone]) is advised. The SARS-CoV-2 RNA is generally  detectable in upper and lower respiratory sp ecimens during the acute  phase of infection. The expected result is Negative. Fact Sheet for Patients:  StrictlyIdeas.no Fact Sheet for Healthcare Providers: BankingDealers.co.za This test is not yet approved or cleared by the [place] FDA and has been  authorized for detection and/or diagnosis of SARS-CoV-2 by FDA under an Emergency Use Authorization (EUA).  This EUA will remain in effect (meaning this test can be used) for the duration of the COVID-19 declaration under Section 564(b)(1) of the Act, 21 U.S.C. section 360bbb-3(b)(1), unless the authorization is terminated or revoked sooner. Performed at [facility], [place] [address], [place], [place] [address]    Dg Chest Port 1 View  Result Date: 09/20/2018 CLINICAL DATA:  Shortness of breath with decreased oxygen saturation EXAM: PORTABLE CHEST 1 VIEW COMPARISON:  July 10, 2018 FINDINGS: There is cardiomegaly with pulmonary vascularity within normal limits. There is a subtle area of increased opacity in the lateral right base, concerning for early pneumonia. Lungs elsewhere are clear. No adenopathy. No bone lesions. IMPRESSION: Concern for small focus of pneumonia in the lateral right base. Lungs elsewhere clear. There is cardiomegaly. Pulmonary vascularity normal. No adenopathy. Followup PA and lateral chest radiographs recommended in 3-4 weeks following trial of antibiotic therapy to ensure resolution and exclude underlying malignancy. Electronically Signed   By: Lowella Grip III M.D.   On: 09/20/2018 10:32    Pending Labs Unresulted Labs (From admission, onward)    Start     Ordered   09/21/18 8119  Basic metabolic panel  Daily,   R     09/20/18 1429   09/21/18 0500  CBC  Tomorrow morning,   R     09/20/18 1429   09/20/18 1432  Legionella Pneumophila Serogp 1 Ur Ag  Once,   STAT     09/20/18 1431   09/20/18 1431  Expectorated sputum assessment w rflx to resp cult  Once,   R     09/20/18 1431   09/20/18 1431  Strep pneumoniae urinary antigen  Once,   STAT     09/20/18 1431   09/20/18 1415  Hemoglobin A1c  Add-on,   AD    Comments: To assess prior glycemic control    09/20/18 1429   09/20/18 1109  Blood culture (routine x 2)  BLOOD CULTURE X 2,   STAT      09/20/18 1110   Signed and Held  HIV antibody (Routine  Screening)  Once,   R     Signed and Held   Signed and Held  Creatinine, serum  (enoxaparin (LOVENOX)    CrCl >/= 30 ml/min)  Weekly,   R    Comments: while on enoxaparin therapy    Signed and Held          Vitals/Pain Today's Vitals   09/20/18 1012 09/20/18 1014 09/20/18 1350 09/20/18 1416  BP:    129/87  Pulse:   (!) 107 (!) 107  Resp:   (!) 25 (!) 25  Temp:      TempSrc:      SpO2:   93% 92%  Weight:  100.2 kg    Height:  5\' 8"  (1.727 m)    PainSc: 7        Isolation Precautions No active isolations  Medications Medications  diazepam (VALIUM) tablet 5 mg (has no administration in time range)  gabapentin (NEURONTIN) capsule 300 mg (has no administration in time range)  levofloxacin (LEVAQUIN) tablet 750 mg (has no administration in time range)  ipratropium-albuterol (DUONEB) 0.5-2.5 (3) MG/3ML nebulizer solution 3 mL (has no administration in time range)  methylPREDNISolone sodium succinate (SOLU-MEDROL) 125 mg/2 mL injection 60 mg (60 mg Intravenous Given 09/20/18 1424)  furosemide (LASIX) injection 40 mg (has no administration in time range)  oxyCODONE-acetaminophen (PERCOCET/ROXICET) 5-325 MG per tablet 1 tablet (1 tablet Oral Given 09/20/18 1423)    And  oxyCODONE (Oxy IR/ROXICODONE) immediate release tablet 5 mg (5 mg Oral Given 09/20/18 1423)  insulin aspart (novoLOG) injection 0-9 Units (has no administration in time range)  insulin aspart (novoLOG) injection 0-5 Units (has no administration in time range)  albuterol (VENTOLIN HFA) 108 (90 Base) MCG/ACT inhaler 4 puff (4 puffs Inhalation Given 09/20/18 1033)  AeroChamber Plus Flo-Vu Medium MISC 1 each (1 each Other Given 09/20/18 1036)  ipratropium (ATROVENT HFA) inhaler 2 puff (2 puffs Inhalation Given 09/20/18 1034)  levofloxacin (LEVAQUIN) IVPB 750 mg (0 mg Intravenous Stopped 09/20/18 1327)  furosemide (LASIX) injection 40 mg (40 mg Intravenous Given 09/20/18  1154)    Mobility walks

## 2018-09-20 NOTE — ED Notes (Signed)
Unable to obtain 2nd set of cultures, pt difficult stick 

## 2018-09-20 NOTE — ED Notes (Signed)
Pt ambulated to bathroom with O2.  When pt returned to room, he was satting 74% RA.  Pt's O2 increased to 6L [place].

## 2018-09-20 NOTE — ED Triage Notes (Signed)
Per EMS: Pt from home with c/o of increased SOB x2 weeks.  Pt hx of COPD.  Pt O2 sats were 86% RA after walking to the truck outside.  Pt placed on 4L [place].  Pt A&Ox4 and ambulatory on arrival to ED.  Pt has had increased pedal edema.

## 2018-09-20 NOTE — H&P (Addendum)
History and Physical    Scott [name] YKZ:[id number] DOB: [birth year] DOA: 09/20/2018  PCP: Fredrich Romans, PA  Patient coming from: home.   I have personally briefly reviewed patient's old medical records in [place]  Chief Complaint: sob, cough for 2 weeks.   HPI: Scott [name] is a 58 y.o. male with medical history significant of COPD, DM, follows up at [facility] presents today with sob and dry cough for more than 2 weeks, associated with exertional sub sternal chest pain, non radiating, resolves with rest. No fevers or chills. No nausea, vomiting, diarrhea, abdominal pain. No syncope, or dizziness. Pt reports pedal edema, 2 pillow orthopnea. No headache, dizziness, or vision abnormalities.   ED Course: During ED he was afebrile tachycardic, tachypneic, normotensive, wheezing, hypoxic requiring 4 L of nasal cannula oxygen to keep sats greater than 90% labs revealed sodium of 142 potassium of 4.8, creatinine of 1.09, anion gap of 8, and bicarb of 29, total bilirubin of 1.7, BNP of 1680.8, troponin of 32. COVID-19 screening test negative, blood cultures were ordered and pending.,  EKG showed sinus rhythm with possible left atrial enlargement. Chest x-ray shows small focus of pneumonia in the right lateral base and recommended follow-up PA lateral chest x-ray in about 2 to 4 weeks for resolution of pneumonia and evaluation of malignancy.   Was admitted to [facility] for evaluation and management of acute respiratory failure with hypoxia secondary to a combination of heart failure, right-sided pneumonia and COPD exacerbation.  While in ED he was given 1 dose of Lasix, IV Levaquin and albuterol and Atrovent duo nebs.  Review of Systems: As per HPI  "All others reviewed and are negative,".  Past Medical History:  Diagnosis Date  . 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 COPD (chronic obstructive pulmonary disease) ([place])   . Diabetes mellitus without  complication ([place])   . GERD (gastroesophageal reflux disease)   . Headache(784.0)   . Pneumonia    hx    Past Surgical History:  Procedure Laterality Date  . ANTERIOR CERVICAL DECOMP/DISCECTOMY FUSION  12/22/2010   Procedure: ANTERIOR CERVICAL DECOMPRESSION/DISCECTOMY FUSION 3 LEVELS;  Surgeon: Ophelia Charter;  Location: [place] [facility];  Service: Neurosurgery;  Laterality: N/A;  Anterior cervical discectomy with fusion cervical three-four, four-five, and five sixCDF with Interbody Prosthesis, plating, and Bone Graft   . ANTERIOR CERVICAL DECOMP/DISCECTOMY FUSION N/A 10/16/2012   Procedure: CERVICAL SIX-SEVEN ANTERIOR CERVICAL DECOMPRESSION/DISCECTOMY FUSION WITH INTERBODY PROTHESIS PLATING BONEGRAFT WITH /POSSIBLE HARDWARE REMOVAL OLD PLATE;  Surgeon: Ophelia Charter, MD;  Location: [facility];  Service: Neurosurgery;  Laterality: N/A;  . MULTIPLE TOOTH EXTRACTIONS    . OTHER SURGICAL HISTORY     surgery on cheekbone and head for fall 2000  . 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     2014 and 2016 - Dr JArnoldo Morale . TONSILLECTOMY     Social history  reports that he quit smoking about 7 months ago. His smoking use included cigarettes. He has a 10.25 pack-year smoking history. He has never used smokeless tobacco. He reports current alcohol use. He reports that he does not use drugs.  Allergies  Allergen Reactions  . Penicillins Anaphylaxis    Has patient had a PCN reaction causing immediate rash, facial/tongue/throat swelling, SOB  or lightheadedness with hypotension: Yes Has patient had a PCN reaction causing severe rash involving mucus membranes or skin necrosis: Yes Has patient had a PCN reaction that required hospitalization: Yes Has patient had a PCN reaction occurring within the last 10 years: No If all of the above answers are "NO", then may proceed with  Cephalosporin use.   [name] [Duloxetine Hcl]     "crazy thoughts"    Family History  Problem Relation Age of Onset  . Emphysema Mother   . COPD Mother   . Asthma Brother   . Diabetes Brother   . Hyperlipidemia Brother   . Hypertension Brother   . Asthma Sister   . Hyperlipidemia Sister   . Hypertension Sister   . Mental illness Sister   . Heart disease Maternal Uncle   . Hypertension Brother     Family history reviewed and pertinent   Prior to Admission medications   Medication Sig Start Date End Date Taking? Authorizing Provider  albuterol (VENTOLIN HFA) 108 (90 Base) MCG/ACT inhaler Inhale 1-2 puffs into the lungs every 6 (six) hours as needed (wheezing or shortness of breath). 05/02/18  Yes Collene Gobble, MD  aspirin 325 MG EC tablet Take 325 mg by mouth every 6 (six) hours as needed for pain.   Yes [provider]  carvedilol (COREG) 3.125 MG tablet Take 3.125 mg by mouth 2 (two) times daily with a meal.   Yes [provider]  COMBIVENT RESPIMAT 20-100 MCG/ACT AERS respimat INHALE 1 PUFF BY MOUTH INTO THE LUNGS FOUR TIMES A DAY Patient taking differently: Inhale 2 puffs into the lungs 4 (four) times daily.  05/08/18  Yes Collene Gobble, MD  cyclobenzaprine (FLEXERIL) 10 MG tablet  08/26/18  Yes [provider]  diazepam (VALIUM) 5 MG tablet Take 5 mg by mouth 2 (two) times daily as needed for anxiety. 08/07/18  Yes [provider]  FLUoxetine (PROZAC) 10 MG capsule  08/07/18  Yes [provider]  Fluticasone-Salmeterol (ADVAIR) 250-50 MCG/DOSE AEPB Inhale 1 puff into the lungs 2 (two) times daily. 04/08/18  Yes Collene Gobble, MD  furosemide (LASIX) 20 MG tablet Take 20 mg by mouth 2 (two) times daily.   Yes [provider]  gabapentin (NEURONTIN) 300 MG capsule TAKE 1 CAPSULE BY MOUTH 3 TIMES DAILY 09/03/18  Yes Rutherford Guys, MD  hydrochlorothiazide (HYDRODIURIL) 12.5 MG tablet Take 1 tablet (12.5 mg total) by mouth  daily. 10/25/17  Yes Rutherford Guys, MD  ipratropium-albuterol (DUONEB) 0.5-2.5 (3) MG/3ML SOLN INHALE THE CONTENTS OF 1 VIAL VIA NEBULIZER EVERY 6 HOURS AS DIRECTED FOR SHORTNESS OF BREATH OR WHEEZING Patient taking differently: Inhale 3 mLs into the lungs 4 (four) times daily. INHALE THE CONTENTS OF 1 VIAL VIA NEBULIZER EVERY 6 HOURS AS DIRECTED FOR SHORTNESS OF BREATH OR WHEEZING 04/10/18  Yes Byrum, Rose Fillers, MD  JANUMET 50-500 MG tablet TAKE 1 TABLET BY MOUTH ONCE A DAY 07/31/18  Yes Rutherford Guys, MD  lisinopril (ZESTRIL) 5 MG tablet Take 5 mg by mouth daily.   Yes [provider]  NARCAN 4 MG/0.1ML LIQD nasal spray kit  06/13/18  Yes [provider]  omeprazole (PRILOSEC) 40 MG capsule TAKE 1 CAPSULE BY MOUTH EVERY DAY 09/03/18  Yes Rutherford Guys, MD  oxyCODONE-acetaminophen (PERCOCET) 10-325 MG tablet Take 1 tablet by mouth every 4 (four) hours. 05/28/18  Yes Oretha Milch D, MD  rosuvastatin (CRESTOR) 20 MG tablet TAKE 1/2 TABLET (  10 MG TOTAL) BY MOUTH DAILY. Patient taking differently: Take 10 mg by mouth daily.  07/31/18  Yes Rutherford Guys, MD  Vitamin D, Ergocalciferol, (DRISDOL) 1.25 MG (50000 UT) CAPS capsule TAKE ONE CAPSULE BY MOUTH ONCE WEEKLY 09/03/18  Yes Rutherford Guys, MD  nicotine polacrilex (NICORETTE) 2 MG gum CHEW 1 PIECE BY MOUTH AS NEEDED FOR SMOKING CESSATION Patient not taking: Reported on 09/20/2018 03/13/18   Rutherford Guys, MD  nystatin (MYCOSTATIN) 100000 UNIT/ML suspension Take 5 mLs (500,000 Units total) by mouth 4 (four) times daily. Swish and swallow Patient not taking: Reported on 09/20/2018 06/19/18   Rutherford Guys, MD    Physical Exam:  Constitutional: NAD, calm, comfortable Vitals:   09/20/18 1011 09/20/18 1014  BP: 123/90   Pulse: 99   Resp: 19   Temp: 98.2 F (36.8 C)   TempSrc: Oral   SpO2: 95%   Weight:  100.2 kg  Height:  '5\' 8"'  (1.727 m)   Eyes: PERRL, lids and conjunctivae normal ENMT: Mucous membranes are moist.   Neck: normal, supple,  Respiratory: bilateral crackles, with scattered wheezing, diminished air entry at bases. Tachypnea present.  Cardiovascular: Regular rate and rhythm,  S1S2, NO murmer. Leg edema 1+ Abdomen: no tenderness, soft, non distended. Bowel sounds positive.  Musculoskeletal: no clubbing / cyanosis. No joint deformity upper and lower extremities. Leg edema 1+ Skin: no rashes, lesions, ulcers. No induration Neurologic: CN 2-12 grossly intact.  Strength 5/5 in all 4.  Psychiatric: Normal judgment and insight. Alert and oriented x 3. Normal mood.     Labs on Admission: I have personally reviewed following labs and imaging studies  CBC: Recent Labs  Lab 09/20/18 1040  WBC 5.3  NEUTROABS 3.7  HGB 14.6  HCT 47.4  MCV 98.3  PLT 001   Basic Metabolic Panel: Recent Labs  Lab 09/20/18 1040  NA 142  K 4.8  CL 105  CO2 29  GLUCOSE 136*  BUN 11  CREATININE 1.09  CALCIUM 8.5*   GFR: Estimated Creatinine Clearance: 85.8 mL/min (by C-G formula based on SCr of 1.09 mg/dL). Liver Function Tests: Recent Labs  Lab 09/20/18 1040  AST 36  ALT 23  ALKPHOS 74  BILITOT 1.7*  PROT 6.9  ALBUMIN 3.8   No results for input(s): LIPASE, AMYLASE in the last 168 hours. No results for input(s): AMMONIA in the last 168 hours. Coagulation Profile: No results for input(s): INR, PROTIME in the last 168 hours. Cardiac Enzymes: No results for input(s): CKTOTAL, CKMB, CKMBINDEX, TROPONINI in the last 168 hours. BNP (last 3 results) No results for input(s): PROBNP in the last 8760 hours. HbA1C: No results for input(s): HGBA1C in the last 72 hours. CBG: No results for input(s): GLUCAP in the last 168 hours. Lipid Profile: No results for input(s): CHOL, HDL, LDLCALC, TRIG, CHOLHDL, LDLDIRECT in the last 72 hours. Thyroid Function Tests: No results for input(s): TSH, T4TOTAL, FREET4, T3FREE, THYROIDAB in the last 72 hours. Anemia Panel: No results for input(s): VITAMINB12, FOLATE,  FERRITIN, TIBC, IRON, RETICCTPCT in the last 72 hours. Urine analysis:    Component Value Date/Time   COLORURINE YELLOW 07/10/2018 [place] 07/10/2018 1340   LABSPEC 1.021 07/10/2018 1340   PHURINE 5.0 07/10/2018 1340   GLUCOSEU NEGATIVE 07/10/2018 1340   HGBUR NEGATIVE 07/10/2018 1340   BILIRUBINUR NEGATIVE 07/10/2018 1340   KETONESUR NEGATIVE 07/10/2018 1340   PROTEINUR NEGATIVE 07/10/2018 1340   NITRITE NEGATIVE 07/10/2018 1340   LEUKOCYTESUR  NEGATIVE 07/10/2018 1340    Radiological Exams on Admission: Dg Chest Port 1 View  Result Date: 09/20/2018 CLINICAL DATA:  Shortness of breath with decreased oxygen saturation EXAM: PORTABLE CHEST 1 VIEW COMPARISON:  July 10, 2018 FINDINGS: There is cardiomegaly with pulmonary vascularity within normal limits. There is a subtle area of increased opacity in the lateral right base, concerning for early pneumonia. Lungs elsewhere are clear. No adenopathy. No bone lesions. IMPRESSION: Concern for small focus of pneumonia in the lateral right base. Lungs elsewhere clear. There is cardiomegaly. Pulmonary vascularity normal. No adenopathy. Followup PA and lateral chest radiographs recommended in 3-4 weeks following trial of antibiotic therapy to ensure resolution and exclude underlying malignancy. Electronically Signed   By: Lowella Grip III M.D.   On: 09/20/2018 10:32    EKG: Independently reviewed. Sinus rhythm, left atrial enlargement.   Assessment/Plan Active Problems:   Acute respiratory failure with hypoxia (HCC) Acute respiratory failure with hypoxia Secondary to a combination of right-sided pneumonia with a combination of mild CHF exacerbation and COPD exacerbation.  Admit the patient to telemetry start the patient on IV Lasix 40 mg daily. Maintain strict intake and output with fluid restriction and daily weights. Get echocardiogram to evaluate for heart failure as his BNP is elevated. Repeat EKG in the morning. Patient  currently denies any chest pain and his elevated troponin probably secondary to demand ischemia from respiratory issues and CHF.   Continue the patient on Levaquin for his right-sided pneumonia.  Get sputum cultures and urine for Legionella and strep pneumonia.   Patient was started on IV Solu-Medrol 60 mg every 8 hours plan to transition to oral steroid in the next 24 hours.  Continue with duo nebs.   Type 2 diabetes mellitus Hold Janumet Continue with sliding scale insulin. Get hemoglobin A1c.   Severity of Illness: The appropriate patient status for this patient is INPATIENT. Inpatient status is judged to be reasonable and necessary in order to provide the required intensity of service to ensure the patient's safety. The patient's presenting symptoms, physical exam findings, and initial radiographic and laboratory data in the context of their chronic comorbidities is felt to place them at high risk for further clinical deterioration. Furthermore, it is not anticipated that the patient will be medically stable for discharge from the hospital within 2 midnights of admission. The following factors support the patient status of inpatient.   " The patient's presenting symptoms include sob and cough. " The worrisome physical exam findings include wheezing, tachypnea, hypoxia,  " The initial radiographic and laboratory data are worrisome because of right sided pneumonia and elevated BNP. " The chronic co-morbidities include dm, COPD.    * I certify that at the point of admission it is my clinical judgment that the patient will require inpatient hospital care spanning beyond 2 midnights from the point of admission due to high intensity of service, high risk for further deterioration and high frequency of surveillance required.*     DVT prophylaxis: lovenox.  Code Status: full code.  Family Communication: none atbedside, discussed the plan with his mother on the phone.  Disposition Plan:  Pending clinical improvement Consults called: Cardiology will be consulted Admission status: Inpatient/telemetry   Hosie Poisson MD [facility] Pager [phone]   If 7PM-7AM, please contact night-coverage [email or web address] Password [email or web address]  09/20/2018, 2:07 PM

## 2018-09-20 NOTE — ED Notes (Signed)
Pt satting 99% on 6L.  Pt placed on 2 L [place], and is 95% RA.

## 2018-09-20 NOTE — ED Provider Notes (Signed)
[facility] DEPT Provider Note   CSN: [id number] Arrival date & time: 09/20/18  0945     History   Chief Complaint Chief Complaint  Patient presents with  . Shortness of Breath    HPI Scott [name] is a 58 y.o. male.     Scott [name] is a 58 y.o. male with a history of COPD, CHF, diabetes, GERD, chronic pain, presents via EMS for evaluation of shortness of breath.  Patient reports symptoms have been persistently worsening over the past 2 weeks.  When EMS arrived patient was satting in the 80s on room air, improved with 4 L nasal cannula.  They noted wheezing in lung fields and patient was given 125 of Solu-Medrol and 0.3 mg of IM epi for presumed COPD exacerbation.  He reports that over the past 2 weeks he has had worsening shortness of breath that is particularly worse with exertion, and he has noted some swelling in his lower extremities as well.  He went to his PCP at [facility] regarding shortness of breath a few weeks ago, and they were concerned for his heart function, did an echo which showed signs of heart failure.  He reports that he was told to come to the hospital for likely admission last Friday but did not come in but due to worsening symptoms he presents today.  He reports that he has had some intermittent central chest pressure, is currently chest pain-free.  He is also had some intermittent cough but denies fevers.  No known sick contacts.  Had COVID-19 infection back in June but has had confirmed negative test since then.     Past Medical History:  Diagnosis Date  . 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 COPD (chronic obstructive pulmonary disease) ([place])   . Diabetes mellitus without complication ([place])   . GERD (gastroesophageal reflux disease)   . Headache(784.0)   . Pneumonia    hx    Patient Active Problem List   Diagnosis Date Noted  . Nausea 05/27/2018  . Acute respiratory failure with  hypoxia ([place]) 05/26/2018  . COPD with acute exacerbation ([place]) 04/04/2018  . Tachycardia 03/31/2018  . Chronic pain syndrome 08/29/2017  . Tobacco use disorder 05/13/2015  . Sebaceous cyst 01/08/2014  . Hypoxia 06/01/2013  . Acute bronchitis 06/01/2013  . Diabetes mellitus without complication ([place])   . GERD (gastroesophageal reflux disease)   . Cervical post-laminectomy syndrome 10/02/2012  . Degeneration of lumbar or lumbosacral intervertebral disc 06/01/2011  . COPD (chronic obstructive pulmonary disease) ([place]) 05/30/2011  . Hyperlipidemia 05/30/2011  . Allergic rhinitis 05/30/2011  . Cervical spondylosis with myelopathy 12/22/2010    Past Surgical History:  Procedure Laterality Date  . ANTERIOR CERVICAL DECOMP/DISCECTOMY FUSION  12/22/2010   Procedure: ANTERIOR CERVICAL DECOMPRESSION/DISCECTOMY FUSION 3 LEVELS;  Surgeon: Ophelia Charter;  Location: [place] NEURO ORS;  Service: Neurosurgery;  Laterality: N/A;  Anterior cervical discectomy with fusion cervical three-four, four-five, and five sixCDF with Interbody Prosthesis, plating, and Bone Graft   . ANTERIOR CERVICAL DECOMP/DISCECTOMY FUSION N/A 10/16/2012   Procedure: CERVICAL SIX-SEVEN ANTERIOR CERVICAL DECOMPRESSION/DISCECTOMY FUSION WITH INTERBODY PROTHESIS PLATING BONEGRAFT WITH /POSSIBLE HARDWARE REMOVAL OLD PLATE;  Surgeon: Ophelia Charter, MD;  Location: [place] NEURO ORS;  Service: Neurosurgery;  Laterality: N/A;  . MULTIPLE TOOTH EXTRACTIONS    . OTHER SURGICAL HISTORY     surgery on cheekbone and head for fall 2000  . 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 - Dr JArnoldo Morale . TONSILLECTOMY          Home Medications    Prior to Admission medications   Medication Sig Start Date End Date Taking? Authorizing Provider  albuterol (VENTOLIN HFA) 108 (90 Base) MCG/ACT inhaler Inhale 1-2 puffs into  the lungs every 6 (six) hours as needed (wheezing or shortness of breath). 05/02/18  Yes BCollene Gobble MD  aspirin 325 MG EC tablet Take 325 mg by mouth every 6 (six) hours as needed for pain.   Yes [provider]  COMBIVENT RESPIMAT 20-100 MCG/ACT AERS respimat INHALE 1 PUFF BY MOUTH INTO THE LUNGS FOUR TIMES A DAY Patient taking differently: Inhale 2 puffs into the lungs 4 (four) times daily.  05/08/18  Yes BCollene Gobble MD  cyclobenzaprine (FLEXERIL) 10 MG tablet  08/26/18  Yes [provider]  diazepam (VALIUM) 5 MG tablet Take 5 mg by mouth 2 (two) times daily as needed for anxiety. 08/07/18  Yes [provider]  FLUoxetine (PROZAC) 10 MG capsule  08/07/18  Yes [provider]  Fluticasone-Salmeterol (ADVAIR) 250-50 MCG/DOSE AEPB Inhale 1 puff into the lungs 2 (two) times daily. 04/08/18  Yes BCollene Gobble MD  gabapentin (NEURONTIN) 300 MG capsule TAKE 1 CAPSULE BY MOUTH 3 TIMES DAILY 09/03/18  Yes SRutherford Guys MD  hydrochlorothiazide (HYDRODIURIL) 12.5 MG tablet Take 1 tablet (12.5 mg total) by mouth daily. 10/25/17  Yes SRutherford Guys MD  ipratropium-albuterol (DUONEB) 0.5-2.5 (3) MG/3ML SOLN INHALE THE CONTENTS OF 1 VIAL VIA NEBULIZER EVERY 6 HOURS AS DIRECTED FOR SHORTNESS OF BREATH OR WHEEZING Patient taking differently: Inhale 3 mLs into the lungs 4 (four) times daily. INHALE THE CONTENTS OF 1 VIAL VIA NEBULIZER EVERY 6 HOURS AS DIRECTED FOR SHORTNESS OF BREATH OR WHEEZING 04/10/18  Yes Byrum, RRose Fillers MD  JANUMET 50-500 MG tablet TAKE 1 TABLET BY MOUTH ONCE A DAY 07/31/18  Yes SRutherford Guys MD  [facility] MG/0.1ML LIQD nasal spray kit  06/13/18  Yes [provider]  omeprazole (PRILOSEC) 40 MG capsule TAKE 1 CAPSULE BY MOUTH EVERY DAY 09/03/18  Yes SRutherford Guys MD  oxyCODONE-acetaminophen (PERCOCET) 10-325 MG tablet Take 1 tablet by mouth every 4 (four) hours. 05/28/18  Yes NOretha MilchD, MD  rosuvastatin (CRESTOR) 20 MG tablet TAKE  1/2 TABLET (10 MG TOTAL) BY MOUTH DAILY. Patient taking differently: Take 10 mg by mouth daily.  07/31/18  Yes SRutherford Guys MD  Vitamin D, Ergocalciferol, (DRISDOL) 1.25 MG (50000 UT) CAPS capsule TAKE ONE CAPSULE BY MOUTH ONCE WEEKLY 09/03/18  Yes SRutherford Guys MD  nicotine polacrilex (NICORETTE) 2 MG gum CHEW 1 PIECE BY MOUTH AS NEEDED FOR SMOKING CESSATION Patient not taking: Reported on 09/20/2018 03/13/18   SRutherford Guys MD  nystatin (MYCOSTATIN) 100000 UNIT/ML suspension Take 5 mLs (500,000 Units total) by mouth 4 (four) times daily. Swish and swallow Patient not taking: Reported on 09/20/2018 06/19/18   SRutherford Guys MD    Family History Family History  Problem Relation Age of Onset  . Emphysema Mother   . COPD Mother   . Asthma Brother   . Diabetes Brother   . Hyperlipidemia Brother   . Hypertension Brother   . Asthma Sister   . Hyperlipidemia  Sister   . Hypertension Sister   . Mental illness Sister   . Heart disease Maternal Uncle   . Hypertension Brother     Social History Social History   Tobacco Use  . Smoking status: Former Smoker    Packs/day: 0.25    Years: 41.00    Pack years: 10.25    Types: Cigarettes    Quit date: 01/21/2018    Years since quitting: 0.6  . Smokeless tobacco: Never Used  Substance Use Topics  . Alcohol use: Yes    Comment: Beer occasionally  . Drug use: No     Allergies   Penicillins and Cymbalta [duloxetine hcl]   Review of Systems Review of Systems  Constitutional: Negative for chills and fever.  HENT: Negative.   Eyes: Negative for visual disturbance.  Respiratory: Positive for cough, shortness of breath and wheezing.   Cardiovascular: Positive for chest pain and leg swelling. Negative for palpitations.  Gastrointestinal: Negative for abdominal pain, diarrhea, nausea and vomiting.  Genitourinary: Negative for dysuria and frequency.  Musculoskeletal: Negative for arthralgias and myalgias.  Skin: Negative for  color change and rash.  Neurological: Negative for dizziness, syncope and light-headedness.     Physical Exam Updated Vital Signs BP 123/90   Pulse 99   Temp 98.2 F (36.8 C) (Oral)   Resp 19   Ht 5' 8" (1.727 m)   Wt 100.2 kg   SpO2 95%   BMI 33.60 kg/m   Physical Exam Vitals signs and nursing note reviewed.  Constitutional:      General: He is not in acute distress.    Appearance: He is well-developed. He is ill-appearing. He is not diaphoretic.  HENT:     Head: Normocephalic and atraumatic.     Mouth/Throat:     Mouth: Mucous membranes are moist.     Pharynx: Oropharynx is clear.  Eyes:     General:        Right eye: No discharge.        Left eye: No discharge.     Pupils: Pupils are equal, round, and reactive to light.  Neck:     Musculoskeletal: Neck supple.  Cardiovascular:     Rate and Rhythm: Regular rhythm. Tachycardia present.     Heart sounds: Normal heart sounds. No murmur. No friction rub. No gallop.   Pulmonary:     Effort: Tachypnea present. No respiratory distress.     Breath sounds: Decreased breath sounds and wheezing present. No rales.     Comments: On arrival patient insisted on ambulating to the bathroom, upon return he is satting 75% on room air with tachypnea and increased respiratory effort, this improved after patient was placed on 4 L nasal cannula.  Lungs with diffuse wheezing throughout bilateral lung fields and some decreased air movement. Abdominal:     General: Bowel sounds are normal. There is no distension.     Palpations: Abdomen is soft. There is no mass.     Tenderness: There is no abdominal tenderness. There is no guarding.     Comments: Abdomen soft, nondistended, nontender to palpation in all quadrants without guarding or peritoneal signs  Musculoskeletal:        General: No deformity.     Right lower leg: Edema present.     Left lower leg: Edema present.     Comments: 2+ edema to bilateral lower extremities, warm and  well-perfused  Skin:    General: Skin is warm and dry.  Capillary Refill: Capillary refill takes less than 2 seconds.  Neurological:     Mental Status: He is alert.     Coordination: Coordination normal.     Comments: Speech is clear, able to follow commands Moves extremities without ataxia, coordination intact  Psychiatric:        Mood and Affect: Mood normal.        Behavior: Behavior normal.      ED Treatments / Results  Labs (all labs ordered are listed, but only abnormal results are displayed) Labs Reviewed  COMPREHENSIVE METABOLIC PANEL - Abnormal; Notable for the following components:      Result Value   Glucose, Bld 136 (*)    Calcium 8.5 (*)    Total Bilirubin 1.7 (*)    All other components within normal limits  CBC WITH DIFFERENTIAL/PLATELET - Abnormal; Notable for the following components:   RDW 16.1 (*)    All other components within normal limits  BRAIN NATRIURETIC PEPTIDE - Abnormal; Notable for the following components:   B Natriuretic Peptide 1,680.8 (*)    All other components within normal limits  GLUCOSE, CAPILLARY - Abnormal; Notable for the following components:   Glucose-Capillary 152 (*)    All other components within normal limits  TROPONIN I (HIGH SENSITIVITY) - Abnormal; Notable for the following components:   Troponin I (High Sensitivity) 32 (*)    All other components within normal limits  SARS CORONAVIRUS 2 (HOSPITAL ORDER, [facility] [facility])  CULTURE, BLOOD (ROUTINE X 2)  CULTURE, BLOOD (ROUTINE X 2)  EXPECTORATED SPUTUM ASSESSMENT W REFEX TO RESP CULTURE  HEMOGLOBIN A1C  STREP PNEUMONIAE URINARY ANTIGEN  LEGIONELLA PNEUMOPHILA SEROGP 1 UR AG  HIV ANTIBODY (ROUTINE TESTING W REFLEX)  TROPONIN I (HIGH SENSITIVITY)    EKG EKG Interpretation  Date/Time:  Friday September 20 2018 10:21:18 EDT Ventricular Rate:  98 PR Interval:    QRS Duration: 106 QT Interval:  389 QTC Calculation: 497 R Axis:   -75 Text  Interpretation:  Sinus rhythm Probable left atrial enlargement Inferior infarct, old Lateral leads are also involved Confirmed by Julianne Rice [phone]) on 09/21/2018 4:39:05 PM   Radiology Dg Chest Port 1 View  Result Date: 09/20/2018 CLINICAL DATA:  Shortness of breath with decreased oxygen saturation EXAM: PORTABLE CHEST 1 VIEW COMPARISON:  July 10, 2018 FINDINGS: There is cardiomegaly with pulmonary vascularity within normal limits. There is a subtle area of increased opacity in the lateral right base, concerning for early pneumonia. Lungs elsewhere are clear. No adenopathy. No bone lesions. IMPRESSION: Concern for small focus of pneumonia in the lateral right base. Lungs elsewhere clear. There is cardiomegaly. Pulmonary vascularity normal. No adenopathy. Followup PA and lateral chest radiographs recommended in 3-4 weeks following trial of antibiotic therapy to ensure resolution and exclude underlying malignancy. Electronically Signed   By: Lowella Grip III M.D.   On: 09/20/2018 10:32    Procedures .Critical Care Performed by: Jacqlyn Larsen, PA-C Authorized by: Jacqlyn Larsen, PA-C   Critical care provider statement:    Critical care time (minutes):  45   Critical care was necessary to treat or prevent imminent or life-threatening deterioration of the following conditions:  Respiratory failure   Critical care was time spent personally by me on the following activities:  Discussions with consultants, evaluation of patient's response to treatment, examination of patient, ordering and performing treatments and interventions, ordering and review of laboratory studies, ordering and review of radiographic studies, pulse oximetry, re-evaluation  of patient's condition, obtaining history from patient or surrogate and review of old charts   (including critical care time)  Medications Ordered in ED Medications  levofloxacin (LEVAQUIN) IVPB 750 mg (has no administration in time range)   furosemide (LASIX) injection 40 mg (has no administration in time range)  albuterol (VENTOLIN HFA) 108 (90 Base) MCG/ACT inhaler 4 puff (4 puffs Inhalation Given 09/20/18 1033)  AeroChamber Plus Flo-Vu Medium MISC 1 each (1 each Other Given 09/20/18 1036)  ipratropium (ATROVENT HFA) inhaler 2 puff (2 puffs Inhalation Given 09/20/18 1034)     Initial Impression / Assessment and Plan / ED Course  I have reviewed the triage vital signs and the nursing notes.  Pertinent labs & imaging results that were available during my care of the patient were reviewed by me and considered in my medical decision making (see chart for details).  58 year old male presents with acute respiratory failure with hypoxia, I suspect this is due to combination of COPD and CHF exacerbations.  Patient was already given steroids and IM epinephrine with EMS, on arrival he ambulated in the department and desatted to 75%, now satting normally at 4 L nasal cannula.  Will check EKG, chest x-ray, basic labs, troponin and BNP, COVID test ordered as well.  Patient given inhaled bronchodilators.  Doubt ACS without any active chest pain, EKG without acute ischemic changes.  Labs show no leukocytosis and normal hemoglobin, no acute electrolyte derangements aside from glucose of 136, calcium minimally low at 8.5., normal renal and liver function.  Troponin mildly elevated at 32, I suspect demand ischemia rather than acute ACS, will check delta.  BNP of 1680.8.  Chest x-ray showed a small sulcus of pneumonia in the right lung base, patient has had cough but is afebrile.  COVID test is negative.  Will start on antibiotics for community-acquired pneumonia.  We will consult hospitalist for acute respiratory failure, mixed etiology including COPD exacerbation, CHF exacerbation and pneumonia.  Case discussed with Dr. Karleen Hampshire who will see and admit the patient.  Final Clinical Impressions(s) / ED Diagnoses   Final diagnoses:  Acute respiratory  failure with hypoxia (HCC)  COPD exacerbation (HCC)  Acute on chronic congestive heart failure, unspecified heart failure type [facility])  Community acquired pneumonia of right lower lobe of lung [facility])    ED Discharge Orders    None       Jacqlyn Larsen, [place] 09/23/18 1558    Maudie Flakes, MD 09/26/18 [phone]

## 2018-09-20 NOTE — Progress Notes (Signed)
  Echocardiogram 2D Echocardiogram has been performed.  Scott [name] 09/20/2018, 3:20 PM

## 2018-09-20 NOTE — Progress Notes (Signed)
Pt. Takes inhalers/nebs 4 times during day and additional at night PRN.  Changed schedule to match home schedule.

## 2018-09-21 DIAGNOSIS — I429 Cardiomyopathy, unspecified: Secondary | ICD-10-CM

## 2018-09-21 DIAGNOSIS — E1169 Type 2 diabetes mellitus with other specified complication: Secondary | ICD-10-CM

## 2018-09-21 DIAGNOSIS — J441 Chronic obstructive pulmonary disease with (acute) exacerbation: Secondary | ICD-10-CM

## 2018-09-21 DIAGNOSIS — Z79891 Long term (current) use of opiate analgesic: Secondary | ICD-10-CM

## 2018-09-21 DIAGNOSIS — I5023 Acute on chronic systolic (congestive) heart failure: Secondary | ICD-10-CM

## 2018-09-21 DIAGNOSIS — E7800 Pure hypercholesterolemia, unspecified: Secondary | ICD-10-CM

## 2018-09-21 DIAGNOSIS — I10 Essential (primary) hypertension: Secondary | ICD-10-CM

## 2018-09-21 DIAGNOSIS — I472 Ventricular tachycardia: Secondary | ICD-10-CM

## 2018-09-21 DIAGNOSIS — E669 Obesity, unspecified: Secondary | ICD-10-CM

## 2018-09-21 DIAGNOSIS — J9601 Acute respiratory failure with hypoxia: Secondary | ICD-10-CM

## 2018-09-21 LAB — BASIC METABOLIC PANEL
Anion gap: 12 (ref 5–15)
BUN: 19 mg/dL (ref 6–20)
CO2: 30 mmol/L (ref 22–32)
Calcium: 8.9 mg/dL (ref 8.9–10.3)
Chloride: 99 mmol/L (ref 98–111)
Creatinine, Ser: 1.23 mg/dL (ref 0.61–1.24)
GFR calc Af Amer: >60 mL/min (ref >60)
GFR calc non Af Amer: >60 mL/min (ref >60)
Glucose, Bld: 218 mg/dL — ABNORMAL HIGH (ref 70–99)
Potassium: 3.3 mmol/L — ABNORMAL LOW (ref 3.5–5.1)
Sodium: 141 mmol/L (ref 135–145)

## 2018-09-21 LAB — CBC
HCT: 50.4 % (ref 39.0–52.0)
Hemoglobin: 15.6 g/dL (ref 13.0–17.0)
MCH: 30.1 pg (ref 26.0–34.0)
MCHC: 31 g/dL (ref 30.0–36.0)
MCV: 97.3 fL (ref 80.0–100.0)
Platelets: 316 10*3/uL (ref 150–400)
RBC: 5.18 MIL/uL (ref 4.22–5.81)
RDW: 15.9 % — ABNORMAL HIGH (ref 11.5–15.5)
WBC: 8.9 10*3/uL (ref 4.0–10.5)
nRBC: 0 % (ref 0.0–0.2)

## 2018-09-21 LAB — HIV ANTIBODY (ROUTINE TESTING W REFLEX): HIV Screen 4th Generation wRfx: NONREACTIVE

## 2018-09-21 LAB — GLUCOSE, CAPILLARY
Glucose-Capillary: 149 mg/dL — ABNORMAL HIGH (ref 70–99)
Glucose-Capillary: 195 mg/dL — ABNORMAL HIGH (ref 70–99)
Glucose-Capillary: 141 mg/dL — ABNORMAL HIGH (ref 70–99)
Glucose-Capillary: 134 mg/dL — ABNORMAL HIGH (ref 70–99)

## 2018-09-21 MED ORDER — BISACODYL 5 MG PO TBEC
5.0000 mg | DELAYED_RELEASE_TABLET | Freq: Every day | ORAL | Status: DC
  Administered 2018-09-21 – 2018-09-24 (×4): 5 mg via ORAL
  Filled 2018-09-21 (×4): qty 1

## 2018-09-21 MED ORDER — ALUM & MAG HYDROXIDE-SIMETH 200-200-20 MG/5ML PO SUSP
15.0000 mL | Freq: Four times a day (QID) | ORAL | Status: DC | PRN
  Administered 2018-09-21 – 2018-09-23 (×3): 15 mL via ORAL
  Filled 2018-09-21 (×3): qty 30

## 2018-09-21 MED ORDER — POTASSIUM CHLORIDE CRYS ER 20 MEQ PO TBCR
40.0000 meq | EXTENDED_RELEASE_TABLET | Freq: Once | ORAL | Status: AC
  Administered 2018-09-21: 40 meq via ORAL
  Filled 2018-09-21: qty 2

## 2018-09-21 MED ORDER — METOPROLOL SUCCINATE ER 25 MG PO TB24
25.0000 mg | ORAL_TABLET | Freq: Every day | ORAL | Status: DC
  Administered 2018-09-21 – 2018-09-24 (×4): 25 mg via ORAL
  Filled 2018-09-21 (×4): qty 1

## 2018-09-21 MED ORDER — ROSUVASTATIN CALCIUM 20 MG PO TABS
20.0000 mg | ORAL_TABLET | Freq: Every day | ORAL | Status: DC
  Administered 2018-09-21 – 2018-09-23 (×3): 20 mg via ORAL
  Filled 2018-09-21 (×4): qty 1

## 2018-09-21 NOTE — Progress Notes (Signed)
[facility] Progress Note  Patient: Scott [name] BHA:[id number]   PCP: Fredrich Romans, [place] DOB: September 30, 1960   DOA: 09/20/2018   DOS: 09/21/2018   Date of Service: the patient was seen and examined on 09/21/2018  Brief hospital course: Pt. with PMH of COPD, type II DM, chronic pain secondary to cervical laminectomy, HLD; admitted on 09/20/2018, presented with complaint of cough and shortness of breath, was found to have multifactorial dyspnea secondary to acute on chronic systolic CHF, COPD exacerbation. Currently further plan is continue current management.  Subjective: Continues to have cough and shortness of breath.  Also has bilateral swelling.  No nausea no vomiting.  No chest pain.  Reports chronic neck pain.  Assessment and Plan: 1.  Acute hypoxic respiratory failure. Multifactorial in the setting of acute on chronic systolic CHF as well as COPD exacerbation. Continue to treat both condition at the same time.  2.  Acute on chronic systolic CHF. Cardiomyopathy. Cardiology consulted appreciate their assistance. Echocardiogram shows EF 30 to 35%. Continue IV diuretics per cardiology. Patient will require Entresto stop lisinopril. Patient will require cardiac catheterization per cardiology.  Plan for Monday right and left heart cath. No evidence of ACS. No indication for anticoagulation. Changing Coreg to Lopressor eventually Toprol-XL.  3.  COPD exacerbation. Right lower lobe pneumonia. Continue Levaquin. Follow-up on cultures. Continue steroids. Continue nebulization. COVID test negative. Continue PRN oxygen.  4.  Dyslipidemia. Continue Crestor.  5. Type 2 Diabetes Mellitus, controled with vascular complication last hemoglobin A1c was 6.4 hold his oral hypoglycemic agents. On insulin sliding scale moderate  6. Essential hypertension. Continue beta-blockers.  7.  Obesity. Body mass index is 33.15 kg/m.  Monitor. Dietary consultation.  8.  Chronic neck pain.  Continue Percocet. Add stool softeners.  Diet: Carb modified diet DVT Prophylaxis: Subcutaneous Lovenox  Advance goals of care discussion: Full code  Family Communication: family was present at bedside, at the time of interview. The pt provided permission to discuss medical plan with the family. Opportunity was given to ask question and all questions were answered satisfactorily.   Disposition:  Discharge to Home .  Consultants: cardiology  Procedures: Echocardiogram   Scheduled Meds: . bisacodyl  5 mg Oral Daily  . enoxaparin (LOVENOX) injection  40 mg Subcutaneous Q24H  . FLUoxetine  20 mg Oral Daily  . furosemide  40 mg Intravenous Daily  . gabapentin  300 mg Oral TID  . insulin aspart  0-5 Units Subcutaneous QHS  . insulin aspart  0-9 Units Subcutaneous TID WC  . ipratropium-albuterol  3 mL Nebulization QID  . levofloxacin  750 mg Oral Daily  . methylPREDNISolone (SOLU-MEDROL) injection  60 mg Intravenous Q8H  . metoprolol succinate  25 mg Oral Daily  . mometasone-formoterol  2 puff Inhalation BID  . nicotine  21 mg Transdermal Daily  . oxyCODONE-acetaminophen  1 tablet Oral Q4H   And  . oxyCODONE  5 mg Oral Q4H  . pantoprazole  40 mg Oral Daily  . rosuvastatin  20 mg Oral q1800   Continuous Infusions: PRN Meds: alum & mag hydroxide-simeth, diazepam Antibiotics: Anti-infectives (From admission, onward)   Start     Dose/Rate Route Frequency Ordered Stop   09/21/18 1000  levofloxacin (LEVAQUIN) tablet 750 mg     750 mg Oral Daily 09/20/18 1412     09/20/18 1115  levofloxacin (LEVAQUIN) IVPB 750 mg     750 mg 100 mL/hr over 90 Minutes Intravenous  Once 09/20/18 1110 09/20/18 1327  Objective: Physical Exam: Vitals:   09/21/18 0445 09/21/18 0858 09/21/18 1125 09/21/18 1631  BP: 136/76   (!) 109/97  Pulse: 100   (!) 104  Resp: 18   18  Temp: 98 F (36.7 C)   98.3 F (36.8 C)  TempSrc: Oral   Oral  SpO2: 92% (!) 88% (!) 89% (!) 89%  Weight: 98.9 kg      Height:        Intake/Output Summary (Last 24 hours) at 09/21/2018 1909 Last data filed at 09/21/2018 1800 Gross per 24 hour  Intake 560 ml  Output 650 ml  Net -90 ml   Filed Weights   09/20/18 1014 09/20/18 1642 09/21/18 0445  Weight: 100.2 kg 99.1 kg 98.9 kg   General: alert and oriented to time, place, and person. Appear in mild distress, affect appropriate Eyes: PERRL, Conjunctiva normal ENT: Oral Mucosa Clear, moist  Neck: no JVD, no Abnormal Mass Or lumps Cardiovascular: S1 and S2 Present, no Murmur, peripheral pulses symmetrical Respiratory: normal respiratory effort, Bilateral Air entry equal and Decreased, no use of accessory muscle, right Crackles, expiratory  wheezes Abdomen: Bowel Sound present, Soft and no tenderness, no hernia Skin: no rashes  Extremities: bilateral  Pedal edema, no calf tenderness Neurologic: normal without focal findings, mental status, speech normal, alert and oriented x3, PERLA, Motor strength 5/5 and symmetric and sensation grossly normal to light touch Gait not checked due to patient safety concerns  Data Reviewed: CBC: Recent Labs  Lab 09/20/18 1040 09/21/18 0407  WBC 5.3 8.9  NEUTROABS 3.7  --   HGB 14.6 15.6  HCT 47.4 50.4  MCV 98.3 97.3  PLT 260 316   Basic Metabolic Panel: Recent Labs  Lab 09/20/18 1040 09/21/18 0407  NA 142 141  K 4.8 3.3*  CL 105 99  CO2 29 30  GLUCOSE 136* 218*  BUN 11 19  CREATININE 1.09 1.23  CALCIUM 8.5* 8.9    Liver Function Tests: Recent Labs  Lab 09/20/18 1040  AST 36  ALT 23  ALKPHOS 74  BILITOT 1.7*  PROT 6.9  ALBUMIN 3.8   No results for input(s): LIPASE, AMYLASE in the last 168 hours. No results for input(s): AMMONIA in the last 168 hours. Coagulation Profile: No results for input(s): INR, PROTIME in the last 168 hours. Cardiac Enzymes: No results for input(s): CKTOTAL, CKMB, CKMBINDEX, TROPONINI in the last 168 hours. BNP (last 3 results) No results for input(s): PROBNP in  the last 8760 hours. CBG: Recent Labs  Lab 09/20/18 1646 09/20/18 2105 09/21/18 0750 09/21/18 1208 09/21/18 1626  GLUCAP 152* 196* 149* 195* 141*   Studies: No results found.   Time spent: 35 minutes  Author: Lynden Oxford, MD [facility] 09/21/2018 7:09 PM  To reach On-call, see care teams to locate the attending and reach out to them via [email or web address]. If 7PM-7AM, please contact night-coverage If you still have difficulty reaching the attending provider, please page the [facility] (Director on Call) for [facility] on amion for assistance.

## 2018-09-21 NOTE — Consult Note (Signed)
Cardiology Consultation:   Patient ID: Scott [name] MRN: [id number]; DOB: 05/20/1960  Admit date: 09/20/2018 Date of Consult: 09/21/2018  Primary Care Provider: Fredrich [name], Scott [name] Primary Cardiologist: Scott Breeding, MD  Primary Electrophysiologist:  None    Patient Profile:   Scott [name] is a 58 y.o. male with a hx of COPD, type 2 diabetes mellitus, chronic pain related to cervical postlaminectomy syndrome (on disability), hyperlipidemia who is being seen today for the evaluation of newly diagnosed CHF at the request of Dr. Marlowe [name].  History of Present Illness:   Scott [name] has been complaining of exertional dyspnea for the last several weeks.  He has longstanding COPD and uses oxygen at night, nebulized bronchodilators and inhalers at home.  He is generally quite sedentary due to problems with cervical and lumbar spine disease (on disability).  He reports that about a week ago he underwent an echocardiogram at [facility] and was told that he should be hospitalized because he has a "low heart".  Family obligations prevented him from being hospitalized.  Over the last week he has developed worsening lower extremity edema and severe dyspnea at rest, progressing to orthopnea.  He also developed a low-grade fever, chills, cough (nonproductive) and sharp pleuritic chest discomfort.  In the emergency room he was hypoxic with saturations in the 80% range.  COVID testing was negative and he was admitted with a presumptive diagnosis of pneumonia and heart failure exacerbation.   Echocardiogram performed yesterday shows left ventricular ejection fraction of 30-35%.  Diastolic function and filling pressures could not be assessed due to tachycardia and E-A fusion.  In May he was seen for persistent sinus tachycardia, documented as such on a Holter monitor that showed an average ventricular rate of 119 bpm.  No other meaningful arrhythmia was seen.  Further work-up was planned, but preempted by  his family obligations.  Thyroid function is normal.  He is not anemic.  He reports one episode of loss of consciousness in April of this year, likely due to simultaneous ingestion of prescription opiate analgesics, benzodiazepines and alcohol when he returned from a trip to [place] where 3 of his family members had died from COVID-73.  Has not had any episodes of syncope that sound arrhythmic.  He denies palpitations.  He was hospitalized for "COPD exacerbation" in May 2020.  No evidence of fluid overload at that time on chest x-ray or CT angiography of the chest.  The report describes "mild atherosclerotic calcification of the aorta" as well as "heart upper normal size".  There was no pulmonary embolism.  There are prominent emphysematous changes in both lungs.  Heart Pathway Score:     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   COPD (chronic obstructive pulmonary disease) ([place])    Diabetes mellitus without complication ([place])    GERD (gastroesophageal reflux disease)    Headache(784.0)    Pneumonia    hx    Past Surgical History:  Procedure Laterality Date   ANTERIOR CERVICAL DECOMP/DISCECTOMY FUSION  12/22/2010   Procedure: ANTERIOR CERVICAL DECOMPRESSION/DISCECTOMY FUSION 3 LEVELS;  Surgeon: Ophelia Charter;  Location: [facility];  Service: Neurosurgery;  Laterality: N/A;  Anterior cervical discectomy with fusion cervical three-four, four-five, and five sixCDF with Interbody Prosthesis, plating, and Bone Graft    ANTERIOR CERVICAL DECOMP/DISCECTOMY FUSION N/A 10/16/2012   Procedure: CERVICAL SIX-SEVEN ANTERIOR CERVICAL DECOMPRESSION/DISCECTOMY FUSION WITH INTERBODY PROTHESIS PLATING BONEGRAFT WITH /  POSSIBLE HARDWARE REMOVAL OLD PLATE;  Surgeon: Ophelia Charter, MD;  Location: [facility];  Service: Neurosurgery;  Laterality: N/A;   MULTIPLE TOOTH EXTRACTIONS     OTHER SURGICAL HISTORY     surgery on cheekbone  and head for fall 2000   OTHER SURGICAL HISTORY     states when about 58yr old he was urinating blood, 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 Authorizing Provider  albuterol (VENTOLIN HFA) 108 (90 Base) MCG/ACT inhaler Inhale 1-2 puffs into the lungs every 6 (six) hours as needed (wheezing or shortness of breath). 05/02/18  Yes BCollene Gobble MD  aspirin 325 MG EC tablet Take 325 mg by mouth every 6 (six) hours as needed for pain.   Yes [provider]  carvedilol (COREG) 3.125 MG tablet Take 3.125 mg by mouth 2 (two) times daily with a meal.   Yes [provider]  COMBIVENT RESPIMAT 20-100 MCG/ACT AERS respimat INHALE 1 PUFF BY MOUTH INTO THE LUNGS FOUR TIMES A DAY Patient taking differently: Inhale 2 puffs into the lungs 4 (four) times daily.  05/08/18  Yes BCollene Gobble MD  cyclobenzaprine (FLEXERIL) 10 MG tablet  08/26/18  Yes [provider]  diazepam (VALIUM) 5 MG tablet Take 5 mg by mouth 2 (two) times daily as needed for anxiety. 08/07/18  Yes [provider]  FLUoxetine (PROZAC) 10 MG capsule  08/07/18  Yes [provider]  Fluticasone-Salmeterol (ADVAIR) 250-50 MCG/DOSE AEPB Inhale 1 puff into the lungs 2 (two) times daily. 04/08/18  Yes BCollene Gobble MD  furosemide (LASIX) 20 MG tablet Take 20 mg by mouth 2 (two) times daily.   Yes [provider]  gabapentin (NEURONTIN) 300 MG capsule TAKE 1 CAPSULE BY MOUTH 3 TIMES DAILY 09/03/18  Yes SRutherford Guys MD  hydrochlorothiazide (HYDRODIURIL) 12.5 MG tablet Take 1 tablet (12.5 mg total) by mouth daily. 10/25/17  Yes SRutherford Guys MD  ipratropium-albuterol (DUONEB) 0.5-2.5 (3) MG/3ML SOLN INHALE THE CONTENTS OF 1 VIAL VIA NEBULIZER EVERY 6 HOURS AS DIRECTED FOR SHORTNESS OF BREATH OR  WHEEZING Patient taking differently: Inhale 3 mLs into the lungs 4 (four) times daily. INHALE THE CONTENTS OF 1 VIAL VIA NEBULIZER EVERY 6 HOURS AS DIRECTED FOR SHORTNESS OF BREATH OR WHEEZING 04/10/18  Yes Byrum, RRose Fillers MD  JANUMET 50-500 MG tablet TAKE 1 TABLET BY MOUTH ONCE A DAY 07/31/18  Yes SRutherford Guys MD  lisinopril (ZESTRIL) 5 MG tablet Take 5 mg by mouth daily.   Yes [provider]  NARCAN 4 MG/0.1ML LIQD nasal spray kit  06/13/18  Yes [provider]  omeprazole (PRILOSEC) 40 MG capsule TAKE 1 CAPSULE BY MOUTH EVERY DAY 09/03/18  Yes SRutherford Guys MD  oxyCODONE-acetaminophen (PERCOCET) 10-325 MG tablet Take 1 tablet by mouth every 4 (four) hours. 05/28/18  Yes NOretha MilchD, MD  rosuvastatin (CRESTOR) 20 MG tablet TAKE 1/2 TABLET (10 MG TOTAL) BY MOUTH DAILY. Patient taking differently: Take 10 mg by mouth daily.  07/31/18  Yes SRutherford Guys MD  Vitamin D, Ergocalciferol, (DRISDOL) 1.25 MG (50000 UT) CAPS capsule TAKE ONE CAPSULE BY MOUTH ONCE WEEKLY 09/03/18  Yes SRutherford Guys MD  nicotine  polacrilex (NICORETTE) 2 MG gum CHEW 1 PIECE BY MOUTH AS NEEDED FOR SMOKING CESSATION Patient not taking: Reported on 09/20/2018 03/13/18   Rutherford Guys, MD  nystatin (MYCOSTATIN) 100000 UNIT/ML suspension Take 5 mLs (500,000 Units total) by mouth 4 (four) times daily. Swish and swallow Patient not taking: Reported on 09/20/2018 06/19/18   Rutherford Guys, MD    Inpatient Medications: Scheduled Meds:  carvedilol  3.125 mg Oral BID WC   enoxaparin (LOVENOX) injection  40 mg Subcutaneous Q24H   FLUoxetine  20 mg Oral Daily   furosemide  40 mg Intravenous Daily   gabapentin  300 mg Oral TID   insulin aspart  0-5 Units Subcutaneous QHS   insulin aspart  0-9 Units Subcutaneous TID WC   ipratropium-albuterol  3 mL Nebulization QID   levofloxacin  750 mg Oral Daily   methylPREDNISolone (SOLU-MEDROL) injection  60 mg Intravenous Q8H   mometasone-formoterol   2 puff Inhalation BID   nicotine  21 mg Transdermal Daily   oxyCODONE-acetaminophen  1 tablet Oral Q4H   And   oxyCODONE  5 mg Oral Q4H   pantoprazole  40 mg Oral Daily   Continuous Infusions:  PRN Meds: diazepam  Allergies:    Allergies  Allergen Reactions   Penicillins Anaphylaxis    Has patient had a PCN reaction causing immediate rash, facial/tongue/throat swelling, SOB or lightheadedness with hypotension: Yes Has patient had a PCN reaction causing severe rash involving mucus membranes or skin necrosis: Yes Has patient had a PCN reaction that required hospitalization: Yes Has patient had a PCN reaction occurring within the last 10 years: No If all of the above answers are "NO", then may proceed with Cephalosporin use.    Cymbalta [Duloxetine Hcl]     "crazy thoughts"    Social History:   Social History   Socioeconomic History   Marital status: Married    Spouse name: Not on file   Number of children: 4   Years of education: Not on file   Highest education level: Not on file  Occupational History   Occupation: DISABLED  Social Designer, fashion/clothing strain: Not on file   Food insecurity    Worry: Not on file    Inability: Not on file   Transportation needs    Medical: Not on file    Non-medical: Not on file  Tobacco Use   Smoking status: Former Smoker    Packs/day: 0.25    Years: 41.00    Pack years: 10.25    Types: Cigarettes    Quit date: 01/21/2018    Years since quitting: 0.6   Smokeless tobacco: Never Used  Substance and Sexual Activity   Alcohol use: Yes    Comment: Beer occasionally   Drug use: No   Sexual activity: Not Currently  Lifestyle   Physical activity    Days per week: Not on file    Minutes per session: Not on file   Stress: Not on file  Relationships   Social connections    Talks on phone: Not on file    Gets together: Not on file    Attends religious service: Not on file    Active member of club or  organization: Not on file    Attends meetings of clubs or organizations: Not on file    Relationship status: Not on file   Intimate partner violence    Fear of current or ex partner: Not on file    Emotionally  abused: Not on file    Physically abused: Not on file    Forced sexual activity: Not on file  Other Topics Concern   Not on file  Social History Narrative   Not on file    Family History:    Family History  Problem Relation Age of Onset   Emphysema Mother    COPD Mother    Asthma Brother    Diabetes Brother    Hyperlipidemia Brother    Hypertension Brother    Asthma Sister    Hyperlipidemia Sister    Hypertension Sister    Mental illness Sister    Heart disease Maternal Uncle    Hypertension Brother      ROS:  Please see the history of present illness.   All other ROS reviewed and negative.     Physical Exam/Data:   Vitals:   09/20/18 1642 09/20/18 2036 09/20/18 2111 09/21/18 0445  BP: (!) 126/113  131/82 136/76  Pulse: 75  (!) 105 100  Resp: _0 Temp: 97.7 F (36.5 C)  98.6 F (37 C) 98 F (36.7 C)  TempSrc: Oral  Oral Oral  SpO2: 90% 93% 91% 92%  Weight: 99.1 kg   98.9 kg  Height: _1  (1.727 m)       Intake/Output Summary (Last 24 hours) at 09/21/2018 0833 Last data filed at 09/20/2018 1900 Gross per 24 hour  Intake 625 ml  Output --  Net 625 ml   Last 3 Weights 09/21/2018 09/20/2018 09/20/2018  Weight (lbs) 218 lb 218 lb 8 oz 221 lb  Weight (kg) 98.884 kg 99.111 kg 100.245 kg     Body mass index is 33.15 kg/m.  General:  Well nourished, well developed, in no acute distress, mildly tachypneic HEENT: normal Lymph: no adenopathy Neck: 6 cm JVD Endocrine:  No thryomegaly Vascular: No carotid bruits; FA pulses 2+ bilaterally without bruits  Cardiac:  normal S1, S2; S3/summation gallop is present RRR; grade 5-6/9 holosystolic murmur at the left lower sternal border no diastolic murmurs or rubs. Lungs: Bilateral rhonchi and  wheezing, moist rales heard primarily in the left lung base. Abd: soft, nontender, no hepatomegaly  Ext: 1+ symmetrical ankle edema Musculoskeletal:  No deformities, BUE and BLE strength normal and equal Skin: warm and dry  Neuro:  CNs 2-12 intact, no focal abnormalities noted Psych:  Normal affect   EKG:  The EKG was personally reviewed and demonstrates: Sinus tachycardia, left atrial abnormality pre-existing marked left axis deviation (old inferior MI versus left anterior fascicular block), old anterolateral T wave inversion, no new repolarization abnormalities.  QTc prolonged 497 ms. Telemetry:  Telemetry was personally reviewed and demonstrates: Mostly sinus rhythm/mild sinus tachycardia, a single 6 beat run of nonsustained ventricular tachycardia yesterday.  Relevant CV Studies:  Echo performed 09/20/2018 LVEF 30-35%, moderately dilated LV with normal wall thickness, severe global hypokinesis, moderate RVE with mildly reduced RV free wall motion, severe biatrial enlargment, aortic valve sclerosis, trivial MR, dilated IVC that does not collapse, trivial circumferential pericardial effusion  Laboratory Data:  High Sensitivity Troponin:   Recent Labs  Lab 09/20/18 1040 09/20/18 1631  TROPONINIHS 32* 23*     Chemistry Recent Labs  Lab 09/20/18 1040 09/21/18 0407  NA 142 141  K 4.8 3.3*  CL 105 99  CO2 29 30  GLUCOSE 136* 218*  BUN 11 19  CREATININE 1.09 1.23  CALCIUM 8.5* 8.9  GFRNONAA >60 >60  GFRAA >60 >60  ANIONGAP 8 12  Recent Labs  Lab 09/20/18 1040  PROT 6.9  ALBUMIN 3.8  AST 36  ALT 23  ALKPHOS 74  BILITOT 1.7*   Hematology Recent Labs  Lab 09/20/18 1040 09/21/18 0407  WBC 5.3 8.9  RBC 4.82 5.18  HGB 14.6 15.6  HCT 47.4 50.4  MCV 98.3 97.3  MCH 30.3 30.1  MCHC 30.8 31.0  RDW 16.1* 15.9*  PLT 260 316   BNP Recent Labs  Lab 09/20/18 1040  BNP 1,680.8*    DDimer No results for input(s): DDIMER in the last 168  hours.   Radiology/Studies:  Dg Chest Port 1 View  Result Date: 09/20/2018 CLINICAL DATA:  Shortness of breath with decreased oxygen saturation EXAM: PORTABLE CHEST 1 VIEW COMPARISON:  July 10, 2018 FINDINGS: There is cardiomegaly with pulmonary vascularity within normal limits. There is a subtle area of increased opacity in the lateral right base, concerning for early pneumonia. Lungs elsewhere are clear. No adenopathy. No bone lesions. IMPRESSION: Concern for small focus of pneumonia in the lateral right base. Lungs elsewhere clear. There is cardiomegaly. Pulmonary vascularity normal. No adenopathy. Followup PA and lateral chest radiographs recommended in 3-4 weeks following trial of antibiotic therapy to ensure resolution and exclude underlying malignancy. Electronically Signed   By: Lowella Grip III M.D.   On: 09/20/2018 10:32    Assessment and Plan:   1. Acute on chronic systolic heart failure: Duration uncertain, but cardiomegaly documented at least over 2 months ago; BNP was elevated 5 months ago in March at 609 and is now markedly higher at 1680.  Resting tachycardia documented in May is also likely expression of heart failure, although chronic use of bronchodilators is likely contributory.  Moderate to severely depressed LVEF at 30-35%.  He is improving after diuretics, but still has some signs of hypervolemia.  Continue diuretics.  Switch from carvedilol to a more selective beta-blocker due to significant reactive airway disease.  Rather than lisinopril, he would benefit from treatment with Entresto.  Needs a 36-hour washout, so we will stop lisinopril today. 2. Cardiomyopathy: Etiology is uncertain.  He has several risk factors for coronary artery disease including male gender, smoking, diabetes, hypertension, hyperlipidemia, but has never had angina pectoris and has only minimal atherosclerotic calcification on his chest CT.  Further work-up with either coronary CT angiography (if his  heart rate is slow enough) or cardiac catheterization (more likely) will be necessary during this admission.  Tentatively plan for right and left heart catheterization on Monday.  There is no evidence for acute coronary syndrome either by ECG, biomarkers or clinical presentation. 3. COPD: Severe shortness of breath and wheezing preceded the diagnosis and other signs of heart failure.  Prominent emphysematous changes seen on CT of the chest.  He will have residual dyspnea even when heart failure treatment is optimal. 4. HLP: Regardless of findings on coronary work-up, target LDL less than 70 since he has diabetes and aortic atherosclerosis.  Restart his rosuvastatin. 5. DM: Controlled, hemoglobin A1c 6.4%. 6. HTN: Blood pressure is not bad but target is definitely less than 130/80 for both CHF and nephro protection.      For questions or updates, please contact [facility] Please consult [email or web address] for contact info under     Signed, Sanda Klein, MD  09/21/2018 8:33 AM

## 2018-09-22 ENCOUNTER — Other Ambulatory Visit: Payer: Self-pay

## 2018-09-22 LAB — GLUCOSE, CAPILLARY
Glucose-Capillary: 157 mg/dL — ABNORMAL HIGH (ref 70–99)
Glucose-Capillary: 251 mg/dL — ABNORMAL HIGH (ref 70–99)
Glucose-Capillary: 216 mg/dL — ABNORMAL HIGH (ref 70–99)
Glucose-Capillary: 122 mg/dL — ABNORMAL HIGH (ref 70–99)

## 2018-09-22 LAB — BASIC METABOLIC PANEL
Anion gap: 10 (ref 5–15)
BUN: 24 mg/dL — ABNORMAL HIGH (ref 6–20)
CO2: 34 mmol/L — ABNORMAL HIGH (ref 22–32)
Calcium: 8.9 mg/dL (ref 8.9–10.3)
Chloride: 99 mmol/L (ref 98–111)
Creatinine, Ser: 1.13 mg/dL (ref 0.61–1.24)
GFR calc Af Amer: >60 mL/min (ref >60)
GFR calc non Af Amer: >60 mL/min (ref >60)
Glucose, Bld: 204 mg/dL — ABNORMAL HIGH (ref 70–99)
Potassium: 3.7 mmol/L (ref 3.5–5.1)
Sodium: 143 mmol/L (ref 135–145)

## 2018-09-22 MED ORDER — SODIUM CHLORIDE 0.9% FLUSH
3.0000 mL | Freq: Two times a day (BID) | INTRAVENOUS | Status: DC
  Administered 2018-09-22 – 2018-09-24 (×4): 3 mL via INTRAVENOUS

## 2018-09-22 MED ORDER — POTASSIUM CHLORIDE CRYS ER 20 MEQ PO TBCR
20.0000 meq | EXTENDED_RELEASE_TABLET | Freq: Every day | ORAL | Status: DC
  Administered 2018-09-22 – 2018-09-24 (×3): 20 meq via ORAL
  Filled 2018-09-22 (×4): qty 1

## 2018-09-22 NOTE — Progress Notes (Signed)
[facility] Progress Note  Patient: Scott [name] EXH:[id number]   PCP: Burnice Logan, PA DOB: [birth year]   DOA: 09/20/2018   DOS: 09/22/2018   Date of Service: the patient was seen and examined on 09/22/2018  Brief hospital course: Pt. with PMH of COPD, type II DM, chronic pain secondary to cervical laminectomy, HLD; admitted on 09/20/2018, presented with complaint of cough and shortness of breath, was found to have multifactorial dyspnea secondary to acute on chronic systolic CHF, COPD exacerbation. Currently further plan is continue current management.  Subjective: Feels better.  Breathing better.  No nausea no vomiting.  Swelling still present but better.  No diarrhea.  No active bleeding.  Assessment and Plan: 1.  Acute hypoxic respiratory failure. Multifactorial in the setting of acute on chronic systolic CHF as well as COPD exacerbation. Continue to treat both condition  2.  Acute on chronic systolic CHF. Cardiomyopathy. Cardiology consulted appreciate their assistance. Echocardiogram shows EF 30 to 35%. Continue IV diuretics per cardiology. Patient will require Entresto stop lisinopril. Patient will require cardiac catheterization per cardiology.  Plan for Monday right and left heart cath. No evidence of ACS. No indication for anticoagulation. Changing Coreg to Lopressor eventually Toprol-XL.  3.  COPD exacerbation. Right lower lobe pneumonia. Continue Levaquin. Follow-up on cultures. Continue steroids. Continue nebulization. COVID test negative. Continue PRN oxygen.  4.  Dyslipidemia. Continue Crestor.  5. Type 2 Diabetes Mellitus, controled with vascular complication last hemoglobin A1c was 6.4 hold his oral hypoglycemic agents. On insulin sliding scale moderate  6. Essential hypertension. Continue beta-blockers.  7.  Obesity. Body mass index is 32.46 kg/m.  Monitor. Dietary consultation.  8.  Chronic neck pain. Continue Percocet. Add stool  softeners.  Diet: Carb modified diet DVT Prophylaxis: Subcutaneous Lovenox  Advance goals of care discussion: Full code  Family Communication: family was present at bedside, at the time of interview. The pt provided permission to discuss medical plan with the family. Opportunity was given to ask question and all questions were answered satisfactorily.   Disposition:  Discharge to Home .  Consultants: cardiology  Procedures: Echocardiogram   Scheduled Meds: . bisacodyl  5 mg Oral Daily  . enoxaparin (LOVENOX) injection  40 mg Subcutaneous Q24H  . FLUoxetine  20 mg Oral Daily  . furosemide  40 mg Intravenous Daily  . gabapentin  300 mg Oral TID  . insulin aspart  0-5 Units Subcutaneous QHS  . insulin aspart  0-9 Units Subcutaneous TID WC  . ipratropium-albuterol  3 mL Nebulization QID  . levofloxacin  750 mg Oral Daily  . methylPREDNISolone (SOLU-MEDROL) injection  60 mg Intravenous Q8H  . metoprolol succinate  25 mg Oral Daily  . mometasone-formoterol  2 puff Inhalation BID  . nicotine  21 mg Transdermal Daily  . oxyCODONE-acetaminophen  1 tablet Oral Q4H   And  . oxyCODONE  5 mg Oral Q4H  . pantoprazole  40 mg Oral Daily  . potassium chloride  20 mEq Oral Daily  . rosuvastatin  20 mg Oral q1800  . sodium chloride flush  3 mL Intravenous Q12H   Continuous Infusions: PRN Meds: alum & mag hydroxide-simeth, diazepam Antibiotics: Anti-infectives (From admission, onward)   Start     Dose/Rate Route Frequency Ordered Stop   09/21/18 1000  levofloxacin (LEVAQUIN) tablet 750 mg     750 mg Oral Daily 09/20/18 1412     09/20/18 1115  levofloxacin (LEVAQUIN) IVPB 750 mg     750 mg 100 mL/hr over 90  Minutes Intravenous  Once 09/20/18 1110 09/20/18 1327       Objective: Physical Exam: Vitals:   09/21/18 2057 09/22/18 0549 09/22/18 0818 09/22/18 1506  BP: 127/87 117/88  122/85  Pulse: (!) 104 100  97  Resp: 16 18  (!) 21  Temp: 98.6 F (37 C) 97.9 F (36.6 C)  98.2 F  (36.8 C)  TempSrc: Oral Oral  Oral  SpO2: 95% 96% 97% 91%  Weight:  96.8 kg    Height:        Intake/Output Summary (Last 24 hours) at 09/22/2018 1524 Last data filed at 09/22/2018 1142 Gross per 24 hour  Intake 680 ml  Output 1350 ml  Net -670 ml   Filed Weights   09/20/18 1642 09/21/18 0445 09/22/18 0549  Weight: 99.1 kg 98.9 kg 96.8 kg   General: alert and oriented to time, place, and person. Appear in mild distress, affect appropriate Eyes: PERRL, Conjunctiva normal ENT: Oral Mucosa Clear, moist  Neck: no JVD, no Abnormal Mass Or lumps Cardiovascular: S1 and S2 Present, no Murmur, peripheral pulses symmetrical Respiratory: normal respiratory effort, Bilateral Air entry equal and Decreased, no use of accessory muscle, right Crackles, expiratory  wheezes Abdomen: Bowel Sound present, Soft and no tenderness, no hernia Skin: no rashes  Extremities: bilateral  Pedal edema, no calf tenderness Neurologic: normal without focal findings, mental status, speech normal, alert and oriented x3, PERLA, Motor strength 5/5 and symmetric and sensation grossly normal to light touch Gait not checked due to patient safety concerns  Data Reviewed: CBC: Recent Labs  Lab 09/20/18 1040 09/21/18 0407  WBC 5.3 8.9  NEUTROABS 3.7  --   HGB 14.6 15.6  HCT 47.4 50.4  MCV 98.3 97.3  PLT 260 010   Basic Metabolic Panel: Recent Labs  Lab 09/20/18 1040 09/21/18 0407 09/22/18 0510  NA 142 141 143  K 4.8 3.3* 3.7  CL 105 99 99  CO2 29 30 34*  GLUCOSE 136* 218* 204*  BUN 11 19 24*  CREATININE 1.09 1.23 1.13  CALCIUM 8.5* 8.9 8.9    Liver Function Tests: Recent Labs  Lab 09/20/18 1040  AST 36  ALT 23  ALKPHOS 74  BILITOT 1.7*  PROT 6.9  ALBUMIN 3.8   No results for input(s): LIPASE, AMYLASE in the last 168 hours. No results for input(s): AMMONIA in the last 168 hours. Coagulation Profile: No results for input(s): INR, PROTIME in the last 168 hours. Cardiac Enzymes: No results  for input(s): CKTOTAL, CKMB, CKMBINDEX, TROPONINI in the last 168 hours. BNP (last 3 results) No results for input(s): PROBNP in the last 8760 hours. CBG: Recent Labs  Lab 09/21/18 1208 09/21/18 1626 09/21/18 2055 09/22/18 0749 09/22/18 1156  GLUCAP 195* 141* 134* 157* 251*   Studies: No results found.   Time spent: 35 minutes  Author: Berle Mull, MD [facility] 09/22/2018 3:24 PM  To reach On-call, see care teams to locate the attending and reach out to them via [email or web address]. If 7PM-7AM, please contact night-coverage If you still have difficulty reaching the attending provider, please page the [facility] (Director on Call) for [facility] on amion for assistance.

## 2018-09-22 NOTE — H&P (View-Only) (Signed)
Progress Note  Patient Name: Scott [name] Date of Encounter: 09/22/2018  Primary Cardiologist: Rollene Rotunda, MD   Subjective   Fair UO, Net neutral fluid balance. Weight down 8 lb since admission. Slept better. Less dyspneic. No chest pain.  Inpatient Medications    Scheduled Meds: . bisacodyl  5 mg Oral Daily  . enoxaparin (LOVENOX) injection  40 mg Subcutaneous Q24H  . FLUoxetine  20 mg Oral Daily  . furosemide  40 mg Intravenous Daily  . gabapentin  300 mg Oral TID  . insulin aspart  0-5 Units Subcutaneous QHS  . insulin aspart  0-9 Units Subcutaneous TID WC  . ipratropium-albuterol  3 mL Nebulization QID  . levofloxacin  750 mg Oral Daily  . methylPREDNISolone (SOLU-MEDROL) injection  60 mg Intravenous Q8H  . metoprolol succinate  25 mg Oral Daily  . mometasone-formoterol  2 puff Inhalation BID  . nicotine  21 mg Transdermal Daily  . oxyCODONE-acetaminophen  1 tablet Oral Q4H   And  . oxyCODONE  5 mg Oral Q4H  . pantoprazole  40 mg Oral Daily  . potassium chloride  20 mEq Oral Daily  . rosuvastatin  20 mg Oral q1800   Continuous Infusions:  PRN Meds: alum & mag hydroxide-simeth, diazepam   Vital Signs    Vitals:   09/21/18 1631 09/21/18 2025 09/21/18 2057 09/22/18 0549  BP: (!) 109/97  127/87 117/88  Pulse: (!) 104  (!) 104 100  Resp: 18  16 18   Temp: 98.3 F (36.8 C)  98.6 F (37 C) 97.9 F (36.6 C)  TempSrc: Oral  Oral Oral  SpO2: (!) 89% (!) 89% 95% 96%  Weight:    96.8 kg  Height:        Intake/Output Summary (Last 24 hours) at 09/22/2018 0758 Last data filed at 09/22/2018 0600 Gross per 24 hour  Intake 1040 ml  Output 1200 ml  Net -160 ml   Last 3 Weights 09/22/2018 09/21/2018 09/20/2018  Weight (lbs) 213 lb 8 oz 218 lb 218 lb 8 oz  Weight (kg) 96.843 kg 98.884 kg 99.111 kg      Telemetry    NSR/mild sinus tachycardia - Personally Reviewed  ECG    STachy, LAD. 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 - Personally  Reviewed  Physical Exam  Lying at 30 degrees, looks comfortable GEN: No acute distress.   Neck: No JVD Cardiac: RRR, no murmurs, rubs, or gallops.  Respiratory: fewer rhonchi and wheezes compared to yesterday GI: Soft, nontender, non-distended  MS: No edema; No deformity. Neuro:  Nonfocal  Psych: Normal affect   Labs    High Sensitivity Troponin:   Recent Labs  Lab 09/20/18 1040 09/20/18 1631  TROPONINIHS 32* 23*      Chemistry Recent Labs  Lab 09/20/18 1040 09/21/18 0407 09/22/18 0510  NA 142 141 143  K 4.8 3.3* 3.7  CL 105 99 99  CO2 29 30 34*  GLUCOSE 136* 218* 204*  BUN 11 19 24*  CREATININE 1.09 1.23 1.13  CALCIUM 8.5* 8.9 8.9  PROT 6.9  --   --   ALBUMIN 3.8  --   --   AST 36  --   --   ALT 23  --   --   ALKPHOS 74  --   --   BILITOT 1.7*  --   --   GFRNONAA >60 >60 >60  GFRAA >60 >60 >60  ANIONGAP 8 12 10  Hematology Recent Labs  Lab 09/20/18 1040 09/21/18 0407  WBC 5.3 8.9  RBC 4.82 5.18  HGB 14.6 15.6  HCT 47.4 50.4  MCV 98.3 97.3  MCH 30.3 30.1  MCHC 30.8 31.0  RDW 16.1* 15.9*  PLT 260 316    BNP Recent Labs  Lab 09/20/18 1040  BNP 1,680.8*     DDimer No results for input(s): DDIMER in the last 168 hours.   Radiology    Dg Chest Port 1 View  Result Date: 09/20/2018 CLINICAL DATA:  Shortness of breath with decreased oxygen saturation EXAM: PORTABLE CHEST 1 VIEW COMPARISON:  July 10, 2018 FINDINGS: There is cardiomegaly with pulmonary vascularity within normal limits. There is a subtle area of increased opacity in the lateral right base, concerning for early pneumonia. Lungs elsewhere are clear. No adenopathy. No bone lesions. IMPRESSION: Concern for small focus of pneumonia in the lateral right base. Lungs elsewhere clear. There is cardiomegaly. Pulmonary vascularity normal. No adenopathy. Followup PA and lateral chest radiographs recommended in 3-4 weeks following trial of antibiotic therapy to ensure resolution and exclude  underlying malignancy. Electronically Signed   By: Lowella Grip III M.D.   On: 09/20/2018 10:32    Cardiac Studies   Echo performed 09/20/2018 LVEF 30-35%, moderately dilated LV with normal wall thickness, severe global hypokinesis, moderate RVE with mildly reduced RV free wall motion, severe biatrial enlargment, aortic valve sclerosis, trivial MR, dilated IVC that does not collapse, trivial circumferential pericardial effusion  Patient Profile     58 y.o. male with newly diagnosed systolic heart failure, with a hx of COPD, type 2 diabetes mellitus, chronic pain related to cervical postlaminectomy syndrome (on disability), hyperlipidemia, aortic atherosclerosis.  Assessment & Plan    1. Acute on chronic systolic heart failure: signs of CHF detectable at least 5 months ago (cardiomegaly, resting tachycardia, elevated BNP).  Moderate to severely depressed LVEF at 30-35%.  Appears closer to euvolemia.  Continue diuretics.  Switched from carvedilol to metoprolol due to underlying bronchospasm. Stopped lisinopril, plan Entresto after heart cath.  2. Cardiomyopathy: No angina, but has multiple risk factors for CAD   Plan for right and left heart catheterization on Monday. This procedure has been fully reviewed with the patient and written informed consent has been obtained. No evidence for recent ACS. 3. COPD: Severe shortness of breath and wheezing preceded the diagnosis and other signs of heart failure.  Prominent emphysematous changes seen on CT of the chest.  He will have residual dyspnea even when heart failure treatment is optimal. Right heart cath will help separate the relative contribution of heart/lung to his dyspnea. 4. HLP: Regardless of findings on coronary work-up, target LDL less than 70 since he has diabetes and aortic atherosclerosis.  Restarted rosuvastatin. 5. DM: Controlled, hemoglobin A1c 6.4%. 6. HTN: Blood pressure is fair; target < 130/80 for both CHF and nephro protection.       For questions or updates, please contact [facility] Please consult [email or web address] for contact info under        Signed, Sanda Klein, MD  09/22/2018, 7:58 AM

## 2018-09-22 NOTE — Progress Notes (Signed)
 Progress Note  Patient Name: Scott [name] Date of Encounter: 09/22/2018  Primary Cardiologist: James Hochrein, MD   Subjective   Fair UO, Net neutral fluid balance. Weight down 8 lb since admission. Slept better. Less dyspneic. No chest pain.  Inpatient Medications    Scheduled Meds: . bisacodyl  5 mg Oral Daily  . enoxaparin (LOVENOX) injection  40 mg Subcutaneous Q24H  . FLUoxetine  20 mg Oral Daily  . furosemide  40 mg Intravenous Daily  . gabapentin  300 mg Oral TID  . insulin aspart  0-5 Units Subcutaneous QHS  . insulin aspart  0-9 Units Subcutaneous TID WC  . ipratropium-albuterol  3 mL Nebulization QID  . levofloxacin  750 mg Oral Daily  . methylPREDNISolone (SOLU-MEDROL) injection  60 mg Intravenous Q8H  . metoprolol succinate  25 mg Oral Daily  . mometasone-formoterol  2 puff Inhalation BID  . nicotine  21 mg Transdermal Daily  . oxyCODONE-acetaminophen  1 tablet Oral Q4H   And  . oxyCODONE  5 mg Oral Q4H  . pantoprazole  40 mg Oral Daily  . potassium chloride  20 mEq Oral Daily  . rosuvastatin  20 mg Oral q1800   Continuous Infusions:  PRN Meds: alum & mag hydroxide-simeth, diazepam   Vital Signs    Vitals:   09/21/18 1631 09/21/18 2025 09/21/18 2057 09/22/18 0549  BP: (!) 109/97  127/87 117/88  Pulse: (!) 104  (!) 104 100  Resp: 18  16 18  Temp: 98.3 F (36.8 C)  98.6 F (37 C) 97.9 F (36.6 C)  TempSrc: Oral  Oral Oral  SpO2: (!) 89% (!) 89% 95% 96%  Weight:    96.8 kg  Height:        Intake/Output Summary (Last 24 hours) at 09/22/2018 0758 Last data filed at 09/22/2018 0600 Gross per 24 hour  Intake 1040 ml  Output 1200 ml  Net -160 ml   Last 3 Weights 09/22/2018 09/21/2018 09/20/2018  Weight (lbs) 213 lb 8 oz 218 lb 218 lb 8 oz  Weight (kg) 96.843 kg 98.884 kg 99.111 kg      Telemetry    NSR/mild sinus tachycardia - Personally Reviewed  ECG    STachy, LAD. 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 - Personally  Reviewed  Physical Exam  Lying at 30 degrees, looks comfortable GEN: No acute distress.   Neck: No JVD Cardiac: RRR, no murmurs, rubs, or gallops.  Respiratory: fewer rhonchi and wheezes compared to yesterday GI: Soft, nontender, non-distended  MS: No edema; No deformity. Neuro:  Nonfocal  Psych: Normal affect   Labs    High Sensitivity Troponin:   Recent Labs  Lab 09/20/18 1040 09/20/18 1631  TROPONINIHS 32* 23*      Chemistry Recent Labs  Lab 09/20/18 1040 09/21/18 0407 09/22/18 0510  NA 142 141 143  K 4.8 3.3* 3.7  CL 105 99 99  CO2 29 30 34*  GLUCOSE 136* 218* 204*  BUN 11 19 24*  CREATININE 1.09 1.23 1.13  CALCIUM 8.5* 8.9 8.9  PROT 6.9  --   --   ALBUMIN 3.8  --   --   AST 36  --   --   ALT 23  --   --   ALKPHOS 74  --   --   BILITOT 1.7*  --   --   GFRNONAA >60 >60 >60  GFRAA >60 >60 >60  ANIONGAP 8 12 10       Hematology Recent Labs  Lab 09/20/18 1040 09/21/18 0407  WBC 5.3 8.9  RBC 4.82 5.18  HGB 14.6 15.6  HCT 47.4 50.4  MCV 98.3 97.3  MCH 30.3 30.1  MCHC 30.8 31.0  RDW 16.1* 15.9*  PLT 260 316    BNP Recent Labs  Lab 09/20/18 1040  BNP 1,680.8*     DDimer No results for input(s): DDIMER in the last 168 hours.   Radiology    Dg Chest Port 1 View  Result Date: 09/20/2018 CLINICAL DATA:  Shortness of breath with decreased oxygen saturation EXAM: PORTABLE CHEST 1 VIEW COMPARISON:  July 10, 2018 FINDINGS: There is cardiomegaly with pulmonary vascularity within normal limits. There is a subtle area of increased opacity in the lateral right base, concerning for early pneumonia. Lungs elsewhere are clear. No adenopathy. No bone lesions. IMPRESSION: Concern for small focus of pneumonia in the lateral right base. Lungs elsewhere clear. There is cardiomegaly. Pulmonary vascularity normal. No adenopathy. Followup PA and lateral chest radiographs recommended in 3-4 weeks following trial of antibiotic therapy to ensure resolution and exclude  underlying malignancy. Electronically Signed   By: Lowella Grip III M.D.   On: 09/20/2018 10:32    Cardiac Studies   Echo performed 09/20/2018 LVEF 30-35%, moderately dilated LV with normal wall thickness, severe global hypokinesis, moderate RVE with mildly reduced RV free wall motion, severe biatrial enlargment, aortic valve sclerosis, trivial MR, dilated IVC that does not collapse, trivial circumferential pericardial effusion  Patient Profile     58 y.o. male with newly diagnosed systolic heart failure, with a hx of COPD, type 2 diabetes mellitus, chronic pain related to cervical postlaminectomy syndrome (on disability), hyperlipidemia, aortic atherosclerosis.  Assessment & Plan    1. Acute on chronic systolic heart failure: signs of CHF detectable at least 5 months ago (cardiomegaly, resting tachycardia, elevated BNP).  Moderate to severely depressed LVEF at 30-35%.  Appears closer to euvolemia.  Continue diuretics.  Switched from carvedilol to metoprolol due to underlying bronchospasm. Stopped lisinopril, plan Entresto after heart cath.  2. Cardiomyopathy: No angina, but has multiple risk factors for CAD   Plan for right and left heart catheterization on Monday. This procedure has been fully reviewed with the patient and written informed consent has been obtained. No evidence for recent ACS. 3. COPD: Severe shortness of breath and wheezing preceded the diagnosis and other signs of heart failure.  Prominent emphysematous changes seen on CT of the chest.  He will have residual dyspnea even when heart failure treatment is optimal. Right heart cath will help separate the relative contribution of heart/lung to his dyspnea. 4. HLP: Regardless of findings on coronary work-up, target LDL less than 70 since he has diabetes and aortic atherosclerosis.  Restarted rosuvastatin. 5. DM: Controlled, hemoglobin A1c 6.4%. 6. HTN: Blood pressure is fair; target < 130/80 for both CHF and nephro protection.       For questions or updates, please contact [facility] Please consult [email or web address] for contact info under        Signed, Sanda Klein, MD  09/22/2018, 7:58 AM

## 2018-09-23 ENCOUNTER — Encounter (HOSPITAL_COMMUNITY)
Admission: EM | Disposition: A | Discharge status: Home / Self Care | Payer: Self-pay | Source: Home / Self Care | Attending: Internal Medicine

## 2018-09-23 DIAGNOSIS — I5023 Acute on chronic systolic (congestive) heart failure: Secondary | ICD-10-CM

## 2018-09-23 DIAGNOSIS — I428 Other cardiomyopathies: Secondary | ICD-10-CM

## 2018-09-23 HISTORY — PX: RIGHT/LEFT HEART CATH AND CORONARY ANGIOGRAPHY: CATH118266

## 2018-09-23 LAB — POCT I-STAT EG7
Acid-Base Excess: 5 mmol/L — ABNORMAL HIGH (ref 0.0–2.0)
Bicarbonate: 33 mmol/L — ABNORMAL HIGH (ref 20.0–28.0)
Calcium, Ion: 0.91 mmol/L — ABNORMAL LOW (ref 1.15–1.40)
HCT: 45 % (ref 39.0–52.0)
Hemoglobin: 15.3 g/dL (ref 13.0–17.0)
O2 Saturation: 55 %
Potassium: 3.4 mmol/L — ABNORMAL LOW (ref 3.5–5.1)
Sodium: 149 mmol/L — ABNORMAL HIGH (ref 135–145)
TCO2: 35 mmol/L — ABNORMAL HIGH (ref 22–32)
pCO2, Ven: 59.8 mmHg (ref 44.0–60.0)
pH, Ven: 7.349 (ref 7.250–7.430)
pO2, Ven: 32 mmHg (ref 32.0–45.0)

## 2018-09-23 LAB — BASIC METABOLIC PANEL
Anion gap: 11 (ref 5–15)
BUN: 23 mg/dL — ABNORMAL HIGH (ref 6–20)
CO2: 31 mmol/L (ref 22–32)
Calcium: 8.5 mg/dL — ABNORMAL LOW (ref 8.9–10.3)
Chloride: 98 mmol/L (ref 98–111)
Creatinine, Ser: 1.07 mg/dL (ref 0.61–1.24)
GFR calc Af Amer: >60 mL/min (ref >60)
GFR calc non Af Amer: >60 mL/min (ref >60)
Glucose, Bld: 221 mg/dL — ABNORMAL HIGH (ref 70–99)
Potassium: 3.9 mmol/L (ref 3.5–5.1)
Sodium: 140 mmol/L (ref 135–145)

## 2018-09-23 LAB — CBC
HCT: 51.3 % (ref 39.0–52.0)
Hemoglobin: 15.8 g/dL (ref 13.0–17.0)
MCH: 30.5 pg (ref 26.0–34.0)
MCHC: 30.8 g/dL (ref 30.0–36.0)
MCV: 99 fL (ref 80.0–100.0)
Platelets: 332 10*3/uL (ref 150–400)
RBC: 5.18 MIL/uL (ref 4.22–5.81)
RDW: 16 % — ABNORMAL HIGH (ref 11.5–15.5)
WBC: 19.3 10*3/uL — ABNORMAL HIGH (ref 4.0–10.5)
nRBC: 0 % (ref 0.0–0.2)

## 2018-09-23 LAB — POCT I-STAT 7, (LYTES, BLD GAS, ICA,H+H)
Acid-Base Excess: 8 mmol/L — ABNORMAL HIGH (ref 0.0–2.0)
Bicarbonate: 35.1 mmol/L — ABNORMAL HIGH (ref 20.0–28.0)
Calcium, Ion: 1.16 mmol/L (ref 1.15–1.40)
HCT: 49 % (ref 39.0–52.0)
Hemoglobin: 16.7 g/dL (ref 13.0–17.0)
O2 Saturation: 93 %
Potassium: 4.2 mmol/L (ref 3.5–5.1)
Sodium: 143 mmol/L (ref 135–145)
TCO2: 37 mmol/L — ABNORMAL HIGH (ref 22–32)
pCO2 arterial: 56.1 mmHg — ABNORMAL HIGH (ref 32.0–48.0)
pH, Arterial: 7.404 (ref 7.350–7.450)
pO2, Arterial: 69 mmHg — ABNORMAL LOW (ref 83.0–108.0)

## 2018-09-23 LAB — GLUCOSE, CAPILLARY
Glucose-Capillary: 191 mg/dL — ABNORMAL HIGH (ref 70–99)
Glucose-Capillary: 136 mg/dL — ABNORMAL HIGH (ref 70–99)
Glucose-Capillary: 194 mg/dL — ABNORMAL HIGH (ref 70–99)
Glucose-Capillary: 192 mg/dL — ABNORMAL HIGH (ref 70–99)

## 2018-09-23 SURGERY — RIGHT/LEFT HEART CATH AND CORONARY ANGIOGRAPHY
Anesthesia: LOCAL

## 2018-09-23 MED ORDER — HYDRALAZINE HCL 20 MG/ML IJ SOLN: 10.0000 mg | INTRAMUSCULAR | Status: AC | PRN

## 2018-09-23 MED ORDER — ENOXAPARIN SODIUM 40 MG/0.4ML ~~LOC~~ SOLN
40.0000 mg | SUBCUTANEOUS | Status: DC
  Administered 2018-09-24: 40 mg via SUBCUTANEOUS
  Filled 2018-09-23: qty 0.4

## 2018-09-23 MED ORDER — SODIUM CHLORIDE 0.9 % IV SOLN: INTRAVENOUS | Status: DC

## 2018-09-23 MED ORDER — MIDAZOLAM HCL 2 MG/2ML IJ SOLN
INTRAMUSCULAR | Status: AC
  Filled 2018-09-23: qty 2

## 2018-09-23 MED ORDER — SODIUM CHLORIDE 0.9% FLUSH: 3.0000 mL | INTRAVENOUS | Status: DC | PRN

## 2018-09-23 MED ORDER — LIDOCAINE HCL (PF) 1 % IJ SOLN
INTRAMUSCULAR | Status: DC | PRN
  Administered 2018-09-23 (×2): 2 mL

## 2018-09-23 MED ORDER — IOHEXOL 350 MG/ML SOLN
INTRAVENOUS | Status: DC | PRN
  Administered 2018-09-23: 80 mL via INTRAVENOUS

## 2018-09-23 MED ORDER — HEPARIN SODIUM (PORCINE) 1000 UNIT/ML IJ SOLN
INTRAMUSCULAR | Status: AC
  Filled 2018-09-23: qty 1

## 2018-09-23 MED ORDER — MIDAZOLAM HCL 2 MG/2ML IJ SOLN
INTRAMUSCULAR | Status: DC | PRN
  Administered 2018-09-23: 1 mg via INTRAVENOUS

## 2018-09-23 MED ORDER — HEPARIN (PORCINE) IN NACL 1000-0.9 UT/500ML-% IV SOLN
INTRAVENOUS | Status: AC
  Filled 2018-09-23: qty 1000

## 2018-09-23 MED ORDER — SODIUM CHLORIDE 0.9% FLUSH
3.0000 mL | Freq: Two times a day (BID) | INTRAVENOUS | Status: DC
  Administered 2018-09-23 – 2018-09-24 (×3): 3 mL via INTRAVENOUS

## 2018-09-23 MED ORDER — FENTANYL CITRATE (PF) 100 MCG/2ML IJ SOLN
INTRAMUSCULAR | Status: DC | PRN
  Administered 2018-09-23: 50 ug via INTRAVENOUS

## 2018-09-23 MED ORDER — OXYCODONE HCL 5 MG PO TABS
ORAL_TABLET | ORAL | Status: AC
  Filled 2018-09-23: qty 1

## 2018-09-23 MED ORDER — VERAPAMIL HCL 2.5 MG/ML IV SOLN
INTRAVENOUS | Status: DC | PRN
  Administered 2018-09-23: 10 mL via INTRA_ARTERIAL

## 2018-09-23 MED ORDER — PREDNISONE 50 MG PO TABS
50.0000 mg | ORAL_TABLET | Freq: Every day | ORAL | Status: DC
  Administered 2018-09-24: 50 mg via ORAL
  Filled 2018-09-23: qty 1

## 2018-09-23 MED ORDER — FENTANYL CITRATE (PF) 100 MCG/2ML IJ SOLN
INTRAMUSCULAR | Status: AC
  Filled 2018-09-23: qty 2

## 2018-09-23 MED ORDER — LIDOCAINE HCL (PF) 1 % IJ SOLN
INTRAMUSCULAR | Status: AC
  Filled 2018-09-23: qty 30

## 2018-09-23 MED ORDER — SODIUM CHLORIDE 0.9 % IV SOLN: 250.0000 mL | INTRAVENOUS | Status: DC | PRN

## 2018-09-23 MED ORDER — FUROSEMIDE 40 MG PO TABS
40.0000 mg | ORAL_TABLET | Freq: Every day | ORAL | Status: DC
  Administered 2018-09-24: 40 mg via ORAL
  Filled 2018-09-23: qty 1

## 2018-09-23 MED ORDER — ASPIRIN 81 MG PO CHEW
81.0000 mg | CHEWABLE_TABLET | ORAL | Status: DC
  Filled 2018-09-23: qty 1

## 2018-09-23 MED ORDER — OXYCODONE-ACETAMINOPHEN 5-325 MG PO TABS
ORAL_TABLET | ORAL | Status: AC
  Filled 2018-09-23: qty 1

## 2018-09-23 MED ORDER — LABETALOL HCL 5 MG/ML IV SOLN
10.0000 mg | INTRAVENOUS | Status: AC | PRN
  Filled 2018-09-23: qty 4

## 2018-09-23 MED ORDER — FUROSEMIDE 10 MG/ML IJ SOLN
40.0000 mg | Freq: Every day | INTRAMUSCULAR | Status: DC
  Administered 2018-09-23: 40 mg via INTRAVENOUS

## 2018-09-23 MED ORDER — HEPARIN SODIUM (PORCINE) 1000 UNIT/ML IJ SOLN
INTRAMUSCULAR | Status: DC | PRN
  Administered 2018-09-23: 5000 [IU] via INTRAVENOUS

## 2018-09-23 MED ORDER — ASPIRIN 81 MG PO CHEW
81.0000 mg | CHEWABLE_TABLET | Freq: Once | ORAL | Status: AC
  Administered 2018-09-23: 81 mg via ORAL

## 2018-09-23 MED ORDER — VERAPAMIL HCL 2.5 MG/ML IV SOLN
INTRAVENOUS | Status: AC
  Filled 2018-09-23: qty 2

## 2018-09-23 MED ORDER — HEPARIN (PORCINE) IN NACL 1000-0.9 UT/500ML-% IV SOLN
INTRAVENOUS | Status: DC | PRN
  Administered 2018-09-23 (×2): 500 mL

## 2018-09-23 SURGICAL SUPPLY — 11 items

## 2018-09-23 NOTE — Progress Notes (Signed)
[facility] Progress Note  Patient: Scott [name] YOV:[id number]   PCP: Burnice Logan, PA DOB: 1960-11-18   DOA: 09/20/2018   DOS: 09/23/2018   Date of Service: the patient was seen and examined on 09/23/2018  Brief hospital course: Pt. with PMH of COPD, type II DM, chronic pain secondary to cervical laminectomy, HLD; admitted on 09/20/2018, presented with complaint of cough and shortness of breath, was found to have multifactorial dyspnea secondary to acute on chronic systolic CHF, COPD exacerbation. Currently further plan is continue current management.  Subjective: Continues to have leg swelling although significantly improved.  No nausea no vomiting.  Has some mild cough as well as mild shortness of breath.  No dizziness no lightheadedness.  No chest pain.  Assessment and Plan: 1.  Acute hypoxic respiratory failure. Multifactorial in the setting of acute on chronic systolic CHF as well as COPD exacerbation. Continue to treat both condition  2.  Acute on chronic systolic CHF. Cardiomyopathy. Cardiology consulted appreciate their assistance. Echocardiogram shows EF 30 to 35%. Patient will require Entresto stop lisinopril. No evidence of ACS. No indication for anticoagulation. Changing Coreg to Lopressor eventually Toprol-XL. Underwent cardiac catheterization which shows nonobstructive cardiomyopathy.  Right heart cath still suggest patient is volume overloaded. Cardiology suggesting IV diuresis.  3.  COPD exacerbation. Right lower lobe pneumonia. Continue Levaquin. Follow-up on cultures. Continue steroids.  We will reduce the dose.  Transition to oral tomorrow. Continue nebulization. COVID test negative. Continue PRN oxygen.  4.  Dyslipidemia. Continue Crestor.  5. Type 2 Diabetes Mellitus, controled with vascular complication last hemoglobin A1c was 6.4 hold his oral hypoglycemic agents. On insulin sliding scale moderate  6. Essential hypertension. Continue  beta-blockers.  7.  Obesity. Body mass index is 33.3 kg/m.  Monitor. Dietary consultation.  8.  Chronic neck pain. Continue Percocet. Add stool softeners.  Diet: Carb modified diet DVT Prophylaxis: Subcutaneous Lovenox  Advance goals of care discussion: Full code  Family Communication: family was present at bedside, at the time of interview. The pt provided permission to discuss medical plan with the family. Opportunity was given to ask question and all questions were answered satisfactorily.   Disposition:  Discharge to Home .  Likely tomorrow  Consultants: cardiology  Procedures: Echocardiogram, cardiac catheterization  Scheduled Meds: . bisacodyl  5 mg Oral Daily  . [START ON 09/24/2018] enoxaparin (LOVENOX) injection  40 mg Subcutaneous Q24H  . FLUoxetine  20 mg Oral Daily  . furosemide  40 mg Intravenous Daily  . furosemide  40 mg Intravenous Daily  . gabapentin  300 mg Oral TID  . insulin aspart  0-5 Units Subcutaneous QHS  . insulin aspart  0-9 Units Subcutaneous TID WC  . ipratropium-albuterol  3 mL Nebulization QID  . levofloxacin  750 mg Oral Daily  . methylPREDNISolone (SOLU-MEDROL) injection  60 mg Intravenous Q8H  . metoprolol succinate  25 mg Oral Daily  . mometasone-formoterol  2 puff Inhalation BID  . nicotine  21 mg Transdermal Daily  . oxyCODONE-acetaminophen  1 tablet Oral Q4H   And  . oxyCODONE  5 mg Oral Q4H  . pantoprazole  40 mg Oral Daily  . potassium chloride  20 mEq Oral Daily  . rosuvastatin  20 mg Oral q1800  . sodium chloride flush  3 mL Intravenous Q12H  . sodium chloride flush  3 mL Intravenous Q12H   Continuous Infusions: . sodium chloride     PRN Meds: sodium chloride, alum & mag hydroxide-simeth, diazepam, hydrALAZINE, labetalol, sodium  chloride flush Antibiotics: Anti-infectives (From admission, onward)   Start     Dose/Rate Route Frequency Ordered Stop   09/21/18 1000  levofloxacin (LEVAQUIN) tablet 750 mg     750 mg Oral  Daily 09/20/18 1412     09/20/18 1115  levofloxacin (LEVAQUIN) IVPB 750 mg     750 mg 100 mL/hr over 90 Minutes Intravenous  Once 09/20/18 1110 09/20/18 1327       Objective: Physical Exam: Vitals:   09/23/18 1230 09/23/18 1245 09/23/18 1300 09/23/18 1315  BP: 129/83 132/83 119/77 124/87  Pulse: (!) 102 (!) 101 100 (!) 103  Resp: 15 15 12  (!) 26  Temp:      TempSrc:      SpO2: 93% 95% 92% 90%  Weight:      Height:        Intake/Output Summary (Last 24 hours) at 09/23/2018 1739 Last data filed at 09/23/2018 1500 Gross per 24 hour  Intake 697.5 ml  Output -  Net 697.5 ml   Filed Weights   09/21/18 0445 09/22/18 0549 09/23/18 0629  Weight: 98.9 kg 96.8 kg 99.3 kg   General: alert and oriented to time, place, and person. Appear in mild distress, affect appropriate Eyes: PERRL, Conjunctiva normal ENT: Oral Mucosa Clear, moist  Neck: no JVD, no Abnormal Mass Or lumps Cardiovascular: S1 and S2 Present, no Murmur, peripheral pulses symmetrical Respiratory: normal respiratory effort, Bilateral Air entry equal and Decreased, no use of accessory muscle, right Crackles, expiratory  wheezes Abdomen: Bowel Sound present, Soft and no tenderness, no hernia Skin: no rashes  Extremities: bilateral  Pedal edema, no calf tenderness Neurologic: normal without focal findings, mental status, speech normal, alert and oriented x3, PERLA, Motor strength 5/5 and symmetric and sensation grossly normal to light touch Gait not checked due to patient safety concerns  Data Reviewed: CBC: Recent Labs  Lab 09/20/18 1040 09/21/18 0407 09/23/18 0454 09/23/18 1037 09/23/18 1045  WBC 5.3 8.9 19.3*  --   --   NEUTROABS 3.7  --   --   --   --   HGB 14.6 15.6 15.8 16.7 15.3  HCT 47.4 50.4 51.3 49.0 45.0  MCV 98.3 97.3 99.0  --   --   PLT 260 316 332  --   --    Basic Metabolic Panel: Recent Labs  Lab 09/20/18 1040 09/21/18 0407 09/22/18 0510 09/23/18 0456 09/23/18 1037 09/23/18 1045  NA 142  141 143 140 143 149*  K 4.8 3.3* 3.7 3.9 4.2 3.4*  CL 105 99 99 98  --   --   CO2 29 30 34* 31  --   --   GLUCOSE 136* 218* 204* 221*  --   --   BUN 11 19 24* 23*  --   --   CREATININE 1.09 1.23 1.13 1.07  --   --   CALCIUM 8.5* 8.9 8.9 8.5*  --   --     Liver Function Tests: Recent Labs  Lab 09/20/18 1040  AST 36  ALT 23  ALKPHOS 74  BILITOT 1.7*  PROT 6.9  ALBUMIN 3.8   No results for input(s): LIPASE, AMYLASE in the last 168 hours. No results for input(s): AMMONIA in the last 168 hours. Coagulation Profile: No results for input(s): INR, PROTIME in the last 168 hours. Cardiac Enzymes: No results for input(s): CKTOTAL, CKMB, CKMBINDEX, TROPONINI in the last 168 hours. BNP (last 3 results) No results for input(s): PROBNP in the last 8760 hours.  CBG: Recent Labs  Lab 09/22/18 1631 09/22/18 2034 09/23/18 0759 09/23/18 1114 09/23/18 1647  GLUCAP 216* 122* 191* 136* 194*   Studies: No results found.   Time spent: 35 minutes  Author: Berle Mull, MD [facility] 09/23/2018 5:39 PM  To reach On-call, see care teams to locate the attending and reach out to them via [email or web address]. If 7PM-7AM, please contact night-coverage If you still have difficulty reaching the attending provider, please page the [facility] (Director on Call) for [facility] on amion for assistance.

## 2018-09-23 NOTE — Progress Notes (Signed)
Inpatient Diabetes Program Recommendations  AACE/ADA: New Consensus Statement on Inpatient Glycemic Control (2015)  Target Ranges:  Prepandial:   less than 140 mg/dL      Peak postprandial:   less than 180 mg/dL (1-2 hours)      Critically ill patients:  140 - 180 mg/dL   Results for Scott [name], Scott [name] (MRN [id number]) as of 09/23/2018 08:49  Ref. Range 09/22/2018 07:49 09/22/2018 11:56 09/22/2018 16:31 09/22/2018 20:34  Glucose-Capillary Latest Ref Range: 70 - 99 mg/dL 157 (H)  2 units NOVOLOG  251 (H)  5 units NOVOLOG  216 (H)  3 units NOVOLOG  122 (H)   Results for Scott [name], Scott [name] (MRN [id number]) as of 09/23/2018 08:49  Ref. Range 09/23/2018 07:59  Glucose-Capillary Latest Ref Range: 70 - 99 mg/dL 191 (H)   Results for Scott [name], Scott [name] (MRN [id number]) as of 09/23/2018 08:49  Ref. Range 09/20/2018 16:31  Hemoglobin A1C Latest Ref Range: 4.8 - 5.6 % 6.4 (H)      Admit with: Acute respiratory failure with hypoxia Secondary to a combination of right-sided pneumonia with a combination of mild CHF exacerbation and COPD exacerbation  History: DM, COPD  Home DM Meds: Janumet 50/ 500 mg 1 tablet Daily  Current Orders: Novolog Sensitive Correction Scale/ SSI (0-9 units) TID AC + HS      Getting Solumedrol 60 mg Q8 hours.  Post-Meal CBGs elevated.  NPO for Cardiac Cath today.     MD- Once patient resumes PO diet post Cardiac Cath, please consider adding Novolog Meal Coverage while patient remains on IV steroids:  Novolog 3 units TID with meals  (Please add the following Hold Parameters: Hold if pt eats <50% of meal, Hold if pt NPO)      --Will follow patient during hospitalization--  Wyn Quaker RN, MSN, CDE Diabetes Coordinator Inpatient Glycemic Control Team Team Pager: [phone] (8a-5p)

## 2018-09-23 NOTE — Care Management Important Message (Signed)
Important Message  Patient Details IM Letter given to Rhea Pink SW to present to the Patient Name: Scott [name] MRN: [id number] Date of Birth: 01-15-1961   Medicare Important Message Given:  Yes     Kerin Salen 09/23/2018, 11:50 AM

## 2018-09-23 NOTE — Progress Notes (Signed)
Patient went for [facility] today.  Normal coronaries.  Elevated filling pressures on RHC.  Patient denies any dyspnea, states that he is feeling back to baseline.  Given elevated pressures on RHC, would favor IV lasix again today, and can likely transition to PO lasix tomorrow.

## 2018-09-23 NOTE — Progress Notes (Signed)
TR BAND REMOVAL  LOCATION:    Radial rt radial  DEFLATED PER PROTOCOL:   yes  TIME BAND OFF / DRESSING APPLIED:    1245/gauze and tegaderm  SITE UPON ARRIVAL:    Level 0  SITE AFTER BAND REMOVAL:    Level 0  CIRCULATION SENSATION AND MOVEMENT:    Within Normal Limits : yes; rt hand and fingers warm and pink, palpable rt radial, elevated on pillow, instructions reviewed w/patient  COMMENTS:

## 2018-09-23 NOTE — Interval H&P Note (Signed)
History and Physical Interval Note:  09/23/2018 10:07 AM  Scott [name]  has presented today for surgery, with the diagnosis of CHF.  The various methods of treatment have been discussed with the patient and family. After consideration of risks, benefits and other options for treatment, the patient has consented to  Procedure(s): RIGHT/LEFT HEART CATH AND CORONARY ANGIOGRAPHY and possible PCI/stent (N/A) as a surgical intervention.  The patient's history has been reviewed, patient examined, no change in status, stable for surgery.  I have reviewed the patient's chart and labs.  Questions were answered to the patient's satisfaction.    Cath Lab Visit (complete for each Cath Lab visit)  Clinical Evaluation Leading to the Procedure:   ACS: No.  Non-ACS:    Anginal Classification: CCS II  Anti-ischemic medical therapy: Minimal Therapy (1 class of medications)  Non-Invasive Test Results: No non-invasive testing performed  Prior CABG: No previous CABG         Scott [name]

## 2018-09-24 ENCOUNTER — Encounter (HOSPITAL_COMMUNITY): Payer: Self-pay | Admitting: Cardiovascular Disease

## 2018-09-24 LAB — GLUCOSE, CAPILLARY
Glucose-Capillary: 173 mg/dL — ABNORMAL HIGH (ref 70–99)
Glucose-Capillary: 192 mg/dL — ABNORMAL HIGH (ref 70–99)

## 2018-09-24 LAB — BASIC METABOLIC PANEL
Anion gap: 10 (ref 5–15)
BUN: 29 mg/dL — ABNORMAL HIGH (ref 6–20)
CO2: 33 mmol/L — ABNORMAL HIGH (ref 22–32)
Calcium: 8.1 mg/dL — ABNORMAL LOW (ref 8.9–10.3)
Chloride: 95 mmol/L — ABNORMAL LOW (ref 98–111)
Creatinine, Ser: 1.17 mg/dL (ref 0.61–1.24)
GFR calc Af Amer: >60 mL/min (ref >60)
GFR calc non Af Amer: >60 mL/min (ref >60)
Glucose, Bld: 184 mg/dL — ABNORMAL HIGH (ref 70–99)
Potassium: 3.8 mmol/L (ref 3.5–5.1)
Sodium: 138 mmol/L (ref 135–145)

## 2018-09-24 LAB — CBC
HCT: 49.2 % (ref 39.0–52.0)
Hemoglobin: 15 g/dL (ref 13.0–17.0)
MCH: 30 pg (ref 26.0–34.0)
MCHC: 30.5 g/dL (ref 30.0–36.0)
MCV: 98.4 fL (ref 80.0–100.0)
Platelets: 303 10*3/uL (ref 150–400)
RBC: 5 MIL/uL (ref 4.22–5.81)
RDW: 15.7 % — ABNORMAL HIGH (ref 11.5–15.5)
WBC: 15 10*3/uL — ABNORMAL HIGH (ref 4.0–10.5)
nRBC: 0 % (ref 0.0–0.2)

## 2018-09-24 MED ORDER — METOPROLOL SUCCINATE ER 25 MG PO TB24: 25.0000 mg | ORAL_TABLET | Freq: Every day | ORAL | 0 refills | Status: DC

## 2018-09-24 MED ORDER — SACUBITRIL-VALSARTAN 24-26 MG PO TABS: 1.0000 | ORAL_TABLET | Freq: Two times a day (BID) | ORAL | 0 refills | Status: DC

## 2018-09-24 MED ORDER — FUROSEMIDE 40 MG PO TABS: 40.0000 mg | ORAL_TABLET | Freq: Every day | ORAL | 0 refills | Status: DC

## 2018-09-24 MED ORDER — SACUBITRIL-VALSARTAN 24-26 MG PO TABS
1.0000 | ORAL_TABLET | Freq: Two times a day (BID) | ORAL | Status: DC
  Filled 2018-09-24: qty 1

## 2018-09-24 MED ORDER — PREDNISONE 10 MG PO TABS: ORAL_TABLET | ORAL | 0 refills | Status: DC

## 2018-09-24 NOTE — Progress Notes (Signed)
   09/24/18 1100  Clinical Encounter Type  Visited With Patient  Visit Type Initial;Psychological support;Spiritual support  Referral From Nurse  Consult/Referral To Chaplain  Spiritual Encounters  Spiritual Needs Emotional;Other (Comment) (Advance Directive )  Stress Factors  Patient Stress Factors Health changes  Advance Directives (For Healthcare)  Does Patient Have a Medical Advance Directive? No  Would patient like information on creating a medical advance directive? No - Patient declined   I spoke with Mr. Cadenhead per spiritual care consult about an Advance Directive. Mr. Yepes wants his wife to make his healthcare decisions in the event that he is ever not able to, and declined to create an Advance Directive because he feels that she will follow through with his wishes.   Please, contact Spiritual Care for further assistance.   Chaplain Shanon Ace M.Div., [facility]

## 2018-09-24 NOTE — Plan of Care (Signed)

## 2018-09-24 NOTE — Progress Notes (Signed)
Patient IV was removed with catheter intact. RN reviewed discharge paperwork with patient, who stated understanding. Patient was in possession of all personal belongings and ambulated independently to elevators to meet family at main lobby entrance.

## 2018-09-24 NOTE — Progress Notes (Addendum)
Progress Note  Patient Name: Scott [name] Date of Encounter: 09/24/2018  Primary Cardiologist: Minus Breeding, MD   Subjective   No SOB or pain.  Feels well, ready to leave.  Inpatient Medications    Scheduled Meds: . bisacodyl  5 mg Oral Daily  . enoxaparin (LOVENOX) injection  40 mg Subcutaneous Q24H  . FLUoxetine  20 mg Oral Daily  . furosemide  40 mg Oral Daily  . gabapentin  300 mg Oral TID  . insulin aspart  0-5 Units Subcutaneous QHS  . insulin aspart  0-9 Units Subcutaneous TID WC  . ipratropium-albuterol  3 mL Nebulization QID  . levofloxacin  750 mg Oral Daily  . metoprolol succinate  25 mg Oral Daily  . mometasone-formoterol  2 puff Inhalation BID  . nicotine  21 mg Transdermal Daily  . oxyCODONE-acetaminophen  1 tablet Oral Q4H   And  . oxyCODONE  5 mg Oral Q4H  . pantoprazole  40 mg Oral Daily  . potassium chloride  20 mEq Oral Daily  . predniSONE  50 mg Oral Q breakfast  . rosuvastatin  20 mg Oral q1800  . sodium chloride flush  3 mL Intravenous Q12H  . sodium chloride flush  3 mL Intravenous Q12H   Continuous Infusions: . sodium chloride     PRN Meds: sodium chloride, alum & mag hydroxide-simeth, diazepam, sodium chloride flush   Vital Signs    Vitals:   09/23/18 2115 09/24/18 0424 09/24/18 0758 09/24/18 0801  BP: 116/81 102/75    Pulse: (!) 102 (!) 102    Resp: 18 18    Temp: 98.6 F (37 C) 98 F (36.7 C)    TempSrc: Oral Oral    SpO2: 94% 97% 95% 95%  Weight:      Height:        Intake/Output Summary (Last 24 hours) at 09/24/2018 1011 Last data filed at 09/24/2018 0700 Gross per 24 hour  Intake 787.5 ml  Output 2100 ml  Net -1312.5 ml   Last 3 Weights 09/23/2018 09/22/2018 09/21/2018  Weight (lbs) 219 lb 213 lb 8 oz 218 lb  Weight (kg) 99.338 kg 96.843 kg 98.884 kg      Telemetry    SR to ST 105 average - Personally Reviewed  ECG    No new - Personally Reviewed  Physical Exam   GEN: No acute distress.   Neck: No JVD  Cardiac: RRR, no murmurs, rubs, or gallops.  Respiratory: Clear to auscultation bilaterally. GI: Soft, nontender, non-distended  MS: No edema; No deformity. Neuro:  Nonfocal  Psych: Normal affect   Labs    High Sensitivity Troponin:   Recent Labs  Lab 09/20/18 1040 09/20/18 1631  TROPONINIHS 32* 23*      Chemistry Recent Labs  Lab 09/20/18 1040  09/22/18 0510 09/23/18 0456 09/23/18 1037 09/23/18 1045 09/24/18 0508  NA 142   < > 143 140 143 149* 138  K 4.8   < > 3.7 3.9 4.2 3.4* 3.8  CL 105   < > 99 98  --   --  95*  CO2 29   < > 34* 31  --   --  33*  GLUCOSE 136*   < > 204* 221*  --   --  184*  BUN 11   < > 24* 23*  --   --  29*  CREATININE 1.09   < > 1.13 1.07  --   --  1.17  CALCIUM 8.5*   < >  8.9 8.5*  --   --  8.1*  PROT 6.9  --   --   --   --   --   --   ALBUMIN 3.8  --   --   --   --   --   --   AST 36  --   --   --   --   --   --   ALT 23  --   --   --   --   --   --   ALKPHOS 74  --   --   --   --   --   --   BILITOT 1.7*  --   --   --   --   --   --   GFRNONAA >60   < > >60 >60  --   --  >60  GFRAA >60   < > >60 >60  --   --  >60  ANIONGAP 8   < > 10 11  --   --  10   < > = values in this interval not displayed.     Hematology Recent Labs  Lab 09/21/18 0407 09/23/18 0454 09/23/18 1037 09/23/18 1045 09/24/18 0508  WBC 8.9 19.3*  --   --  15.0*  RBC 5.18 5.18  --   --  5.00  HGB 15.6 15.8 16.7 15.3 15.0  HCT 50.4 51.3 49.0 45.0 49.2  MCV 97.3 99.0  --   --  98.4  MCH 30.1 30.5  --   --  30.0  MCHC 31.0 30.8  --   --  30.5  RDW 15.9* 16.0*  --   --  15.7*  PLT 316 332  --   --  303    BNP Recent Labs  Lab 09/20/18 1040  BNP 1,680.8*     DDimer No results for input(s): DDIMER in the last 168 hours.   Radiology    No results found.  Cardiac Studies   Echo performed 09/20/2018 LVEF 30-35%, moderately dilated LV with normal wall thickness, severe global hypokinesis, moderate RVE with mildly reduced RV free wall motion, severe  biatrial enlargment, aortic valve sclerosis, trivial MR, dilated IVC that does not collapse, trivial circumferential pericardial effusion  Cardiac cath 09/23/18 . No angiographic evidence of CAD 2. Non-ischemic cardiomyopathy 3. Elevated filling pressures.   Recommendations: Medical management of non-ischemic cardiomyopathy. Pressures suggest continued volume overload. Continue IV diuresis.   Patient Profile     58 y.o. male with newly diagnosed systolic heart failure, with a hx of COPD, type 2 diabetes mellitus, chronic pain related to cervical postlaminectomy syndrome (on disability), hyperlipidemia, aortic atherosclerosis.  Assessment & Plan    1. Acute on chronic systolic heart failure:signs of CHF detectable at least 5 months ago (cardiomegaly, resting tachycardia, elevated BNP).  Moderate to severely depressed LVEF at 30-35%. Appears closer to euvolemia. now on po diuretics. Switched from carvedilol to metoprolol due to underlying bronchospasm. Stopped lisinopril, plan Entresto after heart cath.  He is neg 1137 today since admit and wt not done today.   2. Cardiomyopathy: No angina, but has multiple risk factors for CAD.  Non ischemic CM:  On lasix 40 mg daily,  torpol 25 mg daily.  Add entresto today. Cr 1.17.  He was on lisinopril prior to admit - has not rec'd on this admit  Discussed decrease salt, stopping tobacco, exercise, and meds also weigh daily.  3. COPD:Severe shortness of breath and  wheezing preceded the diagnosis and other signs of heart failure. Prominent emphysematous changes seen on CT of the chest. He will have residual dyspnea even when heart failure treatment is optimal. Right heart cath will help separate the relative contribution of heart/lung to his dyspnea. 4. ZOX:[id number]:Regardless of findings on coronary work-up, target LDL less than 70 since he has diabetes and aortic atherosclerosis.Restarted rosuvastatin. 5. JX:BJYNWGNFAOM:Controlled, hemoglobin A1c 6.4%. 6. ZHY:QMVHQHTN:Blood  pressure is fair; target < 130/80 for both CHF and nephro protection.  Today 116/81     Plan discharge today   For questions or updates, please contact [facility] Please consult [email or web address] for contact info under   [facility] will sign off.   Medication Recommendations:  PO lasix 40 mg daily, Toprol XL 25 mg daily, entresto 24-26 mg Other recommendations (labs, testing, etc):  BMET in 1 week Follow up as an outpatient:  Scheduled with Theodore Demarkhonda Barrett, PA-C on 10/08/18     Signed, Nada BoozerLaura Ingold, NP  09/24/2018, 10:11 AM    Patient seen and examined.  Agree with above documentation.  He reports dyspnea has resolved, no complaints this morning and ready to be discharged.  On exam, he is alert and oriented, lungs CTAB, no LE edema or JVD, tachycardic but regular, no murmurs.  Cath yesterday with normal coronaries, elevated filling pressures.  Transitioned to PO lasix.  Will add entresto.  Continue toprol XL.

## 2018-09-24 NOTE — Discharge Instructions (Signed)
Heart Healthy low salt diet diabetic diet  Weigh daily, first thing every morning call for wt gain of 3 lbs in a day or 5 lbs in a week.   [phone] and ask for Dr. Rosezella [place] nurse.  Exercise by walking 10 min daily for now.  Take all meds as prescribed bring bottles with you to appt.  Call [facility] at [phone] if any bleeding, swelling or drainage at cath site.  May shower, no tub baths for 48 hours for groin sticks. No lifting over 5 pounds for 3 days.  No Driving for 3 days

## 2018-09-25 ENCOUNTER — Other Ambulatory Visit: Payer: Self-pay

## 2018-09-25 LAB — CULTURE, BLOOD (ROUTINE X 2): Culture: NO GROWTH

## 2018-09-26 ENCOUNTER — Other Ambulatory Visit: Payer: Self-pay | Admitting: Family Medicine

## 2018-09-26 ENCOUNTER — Other Ambulatory Visit: Payer: Self-pay | Admitting: Emergency Medicine

## 2018-09-26 NOTE — Discharge Summary (Signed)
[facility] Discharge Summary   Patient: Scott [name] QQI:[id number]   PCP: Fredrich Romans, [place] DOB: 01/14/1961   Date of admission: 09/20/2018   Date of discharge: 09/24/2018     Discharge Diagnoses:  Principal diagnosis Acute systolic CHF from nonischemic cardiomyopathy Active Problems:   Acute respiratory failure with hypoxia (HCC)   Acute on chronic systolic CHF (congestive heart failure), NYHA class 3 ([place])   Non-ischemic cardiomyopathy ([place])   Admitted From: Home Disposition:  Home   Recommendations for Outpatient Follow-up:  1. Please follow-up with PCP as well as cardiology as recommended. 2. Repeat BMP in 1 week recommended.  Follow-up Information    Minus Breeding, MD Follow up on 10/08/2018.   Specialty: Cardiology Why: at 9:00 AM with his PA Rhonda Barrett.  this is for cardiology  Contact information: [address] [place] [address] [phone]        Fredrich Romans, [place]. Schedule an appointment as soon as possible for a visit in 1 week(s).   Specialty: Physician Assistant Why: repeat BMP in 1 week with lab Contact information: [phone] [phone]          Diet recommendation: Cardiac diet  Activity: The patient is advised to gradually reintroduce usual activities,as tolerated .  Discharge Condition: good  Code Status: Full code  History of present illness: As per the H and P dictated on admission, " Scott [name] is a 58 y.o. male with medical history significant of COPD, DM, follows up at [facility] presents today with sob and dry cough for more than 2 weeks, associated with exertional sub sternal chest pain, non radiating, resolves with rest. No fevers or chills. No nausea, vomiting, diarrhea, abdominal pain. No syncope, or dizziness. Pt reports pedal edema, 2 pillow orthopnea. No headache, dizziness, or vision abnormalities. "  Hospital Course:  Summary of his active problems in  the hospital is as following. 1.  Acute hypoxic respiratory failure. Multifactorial in the setting of acute on chronic systolic CHF as well as COPD exacerbation. Continue to treat both condition  2.  Acute on chronic systolic CHF. Cardiomyopathy. Cardiology consulted appreciate their assistance. Echocardiogram shows EF 30 to 35%. Patient will require Entresto stop lisinopril. No evidence of ACS. No indication for anticoagulation. Changing Coreg to Lopressor eventually Toprol-XL. Underwent cardiac catheterization which shows nonobstructive cardiomyopathy.  Right heart cath still suggest patient is volume overloaded. Cardiology suggested to start the patient on Toprol-XL, Lasix as well as Entresto. They will follow-up outpatient.  3.  COPD exacerbation. Right lower lobe pneumonia. Continue Levaquin. Currently no growth, follow-up on cultures. Continue steroids.  We will reduce the dose.  Transition to oral COVID test negative.  4.  Dyslipidemia. Continue Crestor.  5. Type 2 Diabetes Mellitus, controled with vascular complication last hemoglobin A1c was 6.4 Resume home medication.  6. Essential hypertension. Continue beta-blockers.  7.  Obesity. Body mass index is 33.3 kg/m.  Monitor. Dietary consultation.  8.  Chronic neck pain. Continue Percocet. Add stool softeners.  Patient was ambulatory without any assistance.  On the day of the discharge the patient's vitals were stable, and no other acute medical condition were reported by patient. the patient was felt safe to be discharge at Home with no therapy needed on discharge.  Consultants: cardiology  Procedures: Echocardiogram  Cardiac Catheterization   DISCHARGE MEDICATION: Allergies as of 09/24/2018      Reactions   Penicillins Anaphylaxis   Has patient had a PCN reaction  causing immediate rash, facial/tongue/throat swelling, SOB or lightheadedness with hypotension: Yes Has patient had a PCN reaction causing  severe rash involving mucus membranes or skin necrosis: Yes Has patient had a PCN reaction that required hospitalization: Yes Has patient had a PCN reaction occurring within the last 10 years: No If all of the above answers are "NO", then may proceed with Cephalosporin use.   Cymbalta [duloxetine Hcl]    "crazy thoughts"      Medication List    STOP taking these medications   carvedilol 3.125 MG tablet Commonly known as: COREG   hydrochlorothiazide 12.5 MG tablet Commonly known as: HYDRODIURIL   lisinopril 5 MG tablet Commonly known as: ZESTRIL     TAKE these medications   albuterol 108 (90 Base) MCG/ACT inhaler Commonly known as: Ventolin HFA Inhale 1-2 puffs into the lungs every 6 (six) hours as needed (wheezing or shortness of breath).   aspirin 325 MG EC tablet Take 325 mg by mouth every 6 (six) hours as needed for pain.   cyclobenzaprine 10 MG tablet Commonly known as: FLEXERIL   diazepam 5 MG tablet Commonly known as: VALIUM Take 5 mg by mouth 2 (two) times daily as needed for anxiety.   FLUoxetine 10 MG capsule Commonly known as: PROZAC   Fluticasone-Salmeterol 250-50 MCG/DOSE Aepb Commonly known as: ADVAIR Inhale 1 puff into the lungs 2 (two) times daily.   furosemide 40 MG tablet Commonly known as: LASIX Take 1 tablet (40 mg total) by mouth daily. What changed:   medication strength  how much to take  when to take this   gabapentin 300 MG capsule Commonly known as: NEURONTIN TAKE 1 CAPSULE BY MOUTH 3 TIMES DAILY   ipratropium-albuterol 0.5-2.5 (3) MG/3ML Soln Commonly known as: DUONEB INHALE THE CONTENTS OF 1 VIAL VIA NEBULIZER EVERY 6 HOURS AS DIRECTED FOR SHORTNESS OF BREATH OR WHEEZING What changed:   how much to take  how to take this  when to take this   Combivent Respimat 20-100 MCG/ACT Aers respimat Generic drug: Ipratropium-Albuterol INHALE 1 PUFF BY MOUTH INTO THE LUNGS FOUR TIMES A DAY What changed: See the new instructions.    Janumet 50-500 MG tablet Generic drug: sitaGLIPtin-metformin TAKE 1 TABLET BY MOUTH ONCE A DAY   metoprolol succinate 25 MG 24 hr tablet Commonly known as: TOPROL-XL Take 1 tablet (25 mg total) by mouth daily.   Narcan 4 MG/0.1ML Liqd nasal spray kit Generic drug: naloxone   nicotine polacrilex 2 MG gum Commonly known as: NICORETTE CHEW 1 PIECE BY MOUTH AS NEEDED FOR SMOKING CESSATION   nystatin 100000 UNIT/ML suspension Commonly known as: MYCOSTATIN Take 5 mLs (500,000 Units total) by mouth 4 (four) times daily. Swish and swallow   omeprazole 40 MG capsule Commonly known as: PRILOSEC TAKE 1 CAPSULE BY MOUTH EVERY DAY   oxyCODONE-acetaminophen 10-325 MG tablet Commonly known as: PERCOCET Take 1 tablet by mouth every 4 (four) hours.   predniSONE 10 MG tablet Commonly known as: DELTASONE Take 74m daily for 3days,Take 343mdaily for 3days,Take 2043maily for 3days,Take 46m65mily for 3days, then stop   rosuvastatin 20 MG tablet Commonly known as: CRESTOR TAKE 1/2 TABLET (10 MG TOTAL) BY MOUTH DAILY. What changed: See the new instructions.   sacubitril-valsartan 24-26 MG Commonly known as: ENTRESTO Take 1 tablet by mouth 2 (two) times daily.   Vitamin D (Ergocalciferol) 1.25 MG (50000 UT) Caps capsule Commonly known as: DRISDOL TAKE ONE CAPSULE BY MOUTH ONCE WEEKLY  Allergies  Allergen Reactions   Penicillins Anaphylaxis    Has patient had a PCN reaction causing immediate rash, facial/tongue/throat swelling, SOB or lightheadedness with hypotension: Yes Has patient had a PCN reaction causing severe rash involving mucus membranes or skin necrosis: Yes Has patient had a PCN reaction that required hospitalization: Yes Has patient had a PCN reaction occurring within the last 10 years: No If all of the above answers are "NO", then may proceed with Cephalosporin use.    Cymbalta [Duloxetine Hcl]     "crazy thoughts"   Discharge Instructions    Diet - low  sodium heart healthy   Complete by: As directed    Discharge instructions   Complete by: As directed    It is important that you read the given instructions as well as go over your medication list with RN to help you understand your care after this hospitalization.  Discharge Instructions: Please follow-up with PCP in 1-2 weeks  Please request your primary care physician to go over all Hospital Tests and Procedure/Radiological results at the follow up. Please get all Hospital records sent to your PCP by signing hospital release before you go home.   Do not take more than prescribed Pain, Sleep and Anxiety Medications. You were cared for by a hospitalist during your hospital stay. If you have any questions about your discharge medications or the care you received while you were in the hospital after you are discharged, you can call the unit _0 @ you were admitted to and ask to speak with the hospitalist on call if the hospitalist that took care of you is not available.  Once you are discharged, your primary care physician will handle any further medical issues. Please note that NO REFILLS for any discharge medications will be authorized once you are discharged, as it is imperative that you return to your primary care physician (or establish a relationship with a primary care physician if you do not have one) for your aftercare needs so that they can reassess your need for medications and monitor your lab values. You Must read complete instructions/literature along with all the possible adverse reactions/side effects for all the Medicines you take and that have been prescribed to you. Take any new Medicines after you have completely understood and accept all the possible adverse reactions/side effects. Wear Seat belts while driving. If you have smoked or chewed Tobacco in the last 2 yrs please stop smoking and/or stop any Recreational drug use.  If you drink alcohol, please moderate the use and do  not drive, operating heavy machinery, perform activities at heights, swimming or participation in water activities or provide baby sitting services under influence.   Increase activity slowly   Complete by: As directed      Discharge Exam: Filed Weights   09/21/18 0445 09/22/18 0549 09/23/18 0629  Weight: 98.9 kg 96.8 kg 99.3 kg   Vitals:   09/24/18 0758 09/24/18 0801  BP:    Pulse:    Resp:    Temp:    SpO2: 95% 95%   General: Appear in no distress, no Rash; Oral Mucosa Clear, moist. no Abnormal Mass Or lumps Cardiovascular: S1 and S2 Present, no Murmur, Respiratory: normal respiratory effort, Bilateral Air entry present and Clear to Auscultation, no Crackles, no wheezes Abdomen: Bowel Sound present, Soft and no tenderness, no hernia Extremities: bilateral  Pedal edema, no calf tenderness Neurology: alert and oriented to time, place, and person affect appropriate. normal without focal findings, mental status,  speech normal, alert and oriented x3, PERLA, Motor strength 5/5 and symmetric and sensation grossly normal to light touch   The results of significant diagnostics from this hospitalization (including imaging, microbiology, ancillary and laboratory) are listed below for reference.    Significant Diagnostic Studies: Dg Chest Port 1 View  Result Date: 09/20/2018 CLINICAL DATA:  Shortness of breath with decreased oxygen saturation EXAM: PORTABLE CHEST 1 VIEW COMPARISON:  July 10, 2018 FINDINGS: There is cardiomegaly with pulmonary vascularity within normal limits. There is a subtle area of increased opacity in the lateral right base, concerning for early pneumonia. Lungs elsewhere are clear. No adenopathy. No bone lesions. IMPRESSION: Concern for small focus of pneumonia in the lateral right base. Lungs elsewhere clear. There is cardiomegaly. Pulmonary vascularity normal. No adenopathy. Followup PA and lateral chest radiographs recommended in 3-4 weeks following trial of antibiotic  therapy to ensure resolution and exclude underlying malignancy. Electronically Signed   By: Lowella Grip III M.D.   On: 09/20/2018 10:32    Microbiology: Recent Results (from the past 240 hour(s))  SARS Coronavirus 2 [facility] order, Performed in [facility] hospital lab) Nasopharyngeal Nasopharyngeal Swab     Status: None   Collection Time: 09/20/18 10:40 AM   Specimen: Nasopharyngeal Swab  Result Value Ref Range Status   SARS Coronavirus 2 NEGATIVE NEGATIVE Final    Comment: (NOTE) If result is NEGATIVE SARS-CoV-2 target nucleic acids are NOT DETECTED. The SARS-CoV-2 RNA is generally detectable in upper and lower  respiratory specimens during the acute phase of infection. The lowest  concentration of SARS-CoV-2 viral copies this assay can detect is 250  copies / mL. A negative result does not preclude SARS-CoV-2 infection  and should not be used as the sole basis for treatment or other  patient management decisions.  A negative result may occur with  improper specimen collection / handling, submission of specimen other  than nasopharyngeal swab, presence of viral mutation(s) within the  areas targeted by this assay, and inadequate number of viral copies  (<250 copies / mL). A negative result must be combined with clinical  observations, patient history, and epidemiological information. If result is POSITIVE SARS-CoV-2 target nucleic acids are DETECTED. The SARS-CoV-2 RNA is generally detectable in upper and lower  respiratory specimens dur ing the acute phase of infection.  Positive  results are indicative of active infection with SARS-CoV-2.  Clinical  correlation with patient history and other diagnostic information is  necessary to determine patient infection status.  Positive results do  not rule out bacterial infection or co-infection with other viruses. If result is PRESUMPTIVE POSTIVE SARS-CoV-2 nucleic acids MAY BE PRESENT.   A presumptive positive result was  obtained on the submitted specimen  and confirmed on repeat testing.  While 2019 novel coronavirus  (SARS-CoV-2) nucleic acids may be present in the submitted sample  additional confirmatory testing may be necessary for epidemiological  and / or clinical management purposes  to differentiate between  SARS-CoV-2 and other Sarbecovirus currently known to infect humans.  If clinically indicated additional testing with an alternate test  methodology [phone]) is advised. The SARS-CoV-2 RNA is generally  detectable in upper and lower respiratory sp ecimens during the acute  phase of infection. The expected result is Negative. Fact Sheet for Patients:  StrictlyIdeas.no Fact Sheet for Healthcare Providers: BankingDealers.co.za This test is not yet approved or cleared by the [place] FDA and has been authorized for detection and/or diagnosis of SARS-CoV-2 by FDA under an Emergency  Use Authorization (EUA).  This EUA will remain in effect (meaning this test can be used) for the duration of the COVID-19 declaration under Section 564(b)(1) of the Act, 21 U.S.C. section 360bbb-3(b)(1), unless the authorization is terminated or revoked sooner. Performed at [facility], [place] [address] [place], [place] [address]   Blood culture (routine x 2)     Status: None   Collection Time: 09/20/18 11:34 AM   Specimen: BLOOD  Result Value Ref Range Status   Specimen Description   Final    BLOOD RIGHT ANTECUBITAL Performed at [facility] [address] [place], [place] [address]    Special Requests   Final    BOTTLES DRAWN AEROBIC AND ANAEROBIC Blood Culture results may not be optimal due to an excessive volume of blood received in culture bottles Performed at [facility] [address] [place], [place] [address]    Culture   Final    NO GROWTH 5 DAYS Performed at [facility], [place] [address], [place], [place] [address]    Report Status 09/25/2018 FINAL  Final     Labs: CBC: Recent Labs  Lab 09/20/18 1040 09/21/18 0407 09/23/18 0454 09/23/18 1037 09/23/18 1045 09/24/18 0508  WBC 5.3 8.9 19.3*  --   --  15.0*  NEUTROABS 3.7  --   --   --   --   --   HGB 14.6 15.6 15.8 16.7 15.3 15.0  HCT 47.4 50.4 51.3 49.0 45.0 49.2  MCV 98.3 97.3 99.0  --   --  98.4  PLT 260 316 332  --   --  962   Basic Metabolic Panel: Recent Labs  Lab 09/20/18 1040 09/21/18 0407 09/22/18 0510 09/23/18 0456 09/23/18 1037 09/23/18 1045 09/24/18 0508  NA 142 141 143 140 143 149* 138  K 4.8 3.3* 3.7 3.9 4.2 3.4* 3.8  CL 105 99 99 98  --   --  95*  CO2 29 30 34* 31  --   --  33*  GLUCOSE 136* 218* 204* 221*  --   --  184*  BUN 11 19 24* 23*  --   --  29*  CREATININE 1.09 1.23 1.13 1.07  --   --  1.17  CALCIUM 8.5* 8.9 8.9 8.5*  --   --  8.1*   Liver Function Tests: Recent Labs  Lab 09/20/18 1040  AST 36  ALT 23  ALKPHOS 74  BILITOT 1.7*  PROT 6.9  ALBUMIN 3.8   No results for input(s): LIPASE, AMYLASE in the last 168 hours. No results for input(s): AMMONIA in the last 168 hours. Cardiac Enzymes: No results for input(s): CKTOTAL, CKMB, CKMBINDEX, TROPONINI in the last 168 hours. BNP (last 3 results) Recent Labs    04/05/18 0510 07/10/18 1424 09/20/18 1040  BNP 609.6* 446.8* 1,680.8*   CBG: Recent Labs  Lab 09/23/18 1114 09/23/18 1647 09/23/18 2250 09/24/18 0623 09/24/18 0737  GLUCAP 136* 194* 192* 173* 192*   Time spent: 35 minutes  Signed:  Berle Mull  [facility] 09/24/2018

## 2018-09-26 NOTE — Telephone Encounter (Signed)
Forwarding medication refill requests to the clinical pool for review. 

## 2018-09-27 NOTE — Telephone Encounter (Signed)
Please advise on med refills  Pt has his own PCP   Pt was last seen with [place] for a few TELE-MED Appts for COPD  He is now requesting a few refills  We have never given him the Flexeril,  Gabapentin & Prolisec was given in Aug for 1 month supply  HCTZ is not on his med list.  No f/u appt scheduled

## 2018-10-01 DIAGNOSIS — F17210 Nicotine dependence, cigarettes, uncomplicated: Secondary | ICD-10-CM | POA: Diagnosis not present

## 2018-10-01 DIAGNOSIS — J449 Chronic obstructive pulmonary disease, unspecified: Secondary | ICD-10-CM | POA: Diagnosis not present

## 2018-10-01 DIAGNOSIS — R0602 Shortness of breath: Secondary | ICD-10-CM | POA: Diagnosis not present

## 2018-10-01 DIAGNOSIS — I5020 Unspecified systolic (congestive) heart failure: Secondary | ICD-10-CM | POA: Diagnosis not present

## 2018-10-07 DIAGNOSIS — Z79899 Other long term (current) drug therapy: Secondary | ICD-10-CM | POA: Diagnosis not present

## 2018-10-07 DIAGNOSIS — M542 Cervicalgia: Secondary | ICD-10-CM | POA: Diagnosis not present

## 2018-10-08 ENCOUNTER — Ambulatory Visit: Payer: Medicare HMO | Admitting: Physician Assistant

## 2018-10-08 NOTE — Progress Notes (Deleted)
Cardiology Office Note   Date:  10/08/2018   ID:  Scott [name], DOB 05-13-1960, MRN [id number]  PCP:  Fredrich Romans, [place] Cardiologist:  Minus Breeding, MD 05/24/2018 Susanne Borders , PA-C   No chief complaint on file.   History of Present Illness: Scott [name] is a 58 y.o. male with newly diagnosed systolic heart failure,with a hx of COPD, type 2 diabetes mellitus, chronic pain related to cervical postlaminectomy syndrome (on disability), hyperlipidemia, aortic atherosclerosis.  Pt admitted 08/28-09/01/2018 for CHF exacerbation, had clean cath and echo w/ EF 35%  Scott [name] presents for ***   Past Medical History:  Diagnosis Date  . 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 COPD (chronic obstructive pulmonary disease) ([place])   . Diabetes mellitus without complication ([place])   . GERD (gastroesophageal reflux disease)   . Headache(784.0)   . Pneumonia    hx    Past Surgical History:  Procedure Laterality Date  . ANTERIOR CERVICAL DECOMP/DISCECTOMY FUSION  12/22/2010   Procedure: ANTERIOR CERVICAL DECOMPRESSION/DISCECTOMY FUSION 3 LEVELS;  Surgeon: Ophelia Charter;  Location: [facility];  Service: Neurosurgery;  Laterality: N/A;  Anterior cervical discectomy with fusion cervical three-four, four-five, and five sixCDF with Interbody Prosthesis, plating, and Bone Graft   . ANTERIOR CERVICAL DECOMP/DISCECTOMY FUSION N/A 10/16/2012   Procedure: CERVICAL SIX-SEVEN ANTERIOR CERVICAL DECOMPRESSION/DISCECTOMY FUSION WITH INTERBODY PROTHESIS PLATING BONEGRAFT WITH /POSSIBLE HARDWARE REMOVAL OLD PLATE;  Surgeon: Ophelia Charter, MD;  Location: [facility];  Service: Neurosurgery;  Laterality: N/A;  . MULTIPLE TOOTH EXTRACTIONS    . OTHER SURGICAL HISTORY     surgery on cheekbone and head for fall 2000  . 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  RIGHT/LEFT HEART CATH AND CORONARY ANGIOGRAPHY N/A 09/23/2018   Procedure: RIGHT/LEFT HEART CATH AND CORONARY ANGIOGRAPHY and possible PCI/stent;  Surgeon: MBurnell Blanks MD;  [facility];  Service: Cardiovascular;  Laterality: N/A;  . SPINE SURGERY     2014 and 2016 - Dr JArnoldo Morale . TONSILLECTOMY      Current Outpatient Medications  Medication Sig Dispense Refill  . ADVAIR DISKUS 250-50 MCG/DOSE AEPB INHALE 1 PUFF INTO THE LUNGS TWICE DAILY 60 each 2  . aspirin 325 MG EC tablet Take 325 mg by mouth every 6 (six) hours as needed for pain.    . COMBIVENT RESPIMAT 20-100 MCG/ACT AERS respimat INHALE 1 PUFF BY MOUTH INTO THE LUNGS FOUR TIMES A DAY (Patient taking differently: Inhale 2 puffs into the lungs 4 (four) times daily. ) 1 Inhaler 5  . cyclobenzaprine (FLEXERIL) 10 MG tablet     . diazepam (VALIUM) 5 MG tablet Take 5 mg by mouth 2 (two) times daily as needed for anxiety.    .Marland KitchenFLUoxetine (PROZAC) 10 MG capsule     . furosemide (LASIX) 40 MG tablet Take 1 tablet (40 mg total) by mouth daily. 30 tablet 0  . gabapentin (NEURONTIN) 300 MG capsule TAKE 1 CAPSULE BY MOUTH 3 TIMES DAILY 90 capsule 0  . ipratropium-albuterol (DUONEB) 0.5-2.5 (3) MG/3ML SOLN INHALE THE CONTENTS OF 1 VIAL VIA NEBULIZER EVERY 6 HOURS AS DIRECTED FOR SHORTNESS OF BREATH OR WHEEZING (Patient taking differently: Inhale 3 mLs into the lungs 4 (four) times daily. INHALE THE CONTENTS OF 1 VIAL VIA NEBULIZER  EVERY 6 HOURS AS DIRECTED FOR SHORTNESS OF BREATH OR WHEEZING) 360 mL 5  . JANUMET 50-500 MG tablet TAKE 1 TABLET BY MOUTH ONCE A DAY 90 tablet 0  . metoprolol succinate (TOPROL-XL) 25 MG 24 hr tablet Take 1 tablet (25 mg total) by mouth daily. 30 tablet 0  . NARCAN 4 MG/0.1ML LIQD nasal spray kit     . nicotine polacrilex (NICORETTE) 2 MG gum CHEW 1 PIECE BY MOUTH AS NEEDED FOR SMOKING CESSATION (Patient not taking: Reported on 09/20/2018) 100 each 2  . nystatin (MYCOSTATIN) 100000 UNIT/ML  suspension Take 5 mLs (500,000 Units total) by mouth 4 (four) times daily. Swish and swallow (Patient not taking: Reported on 09/20/2018) 120 mL 1  . omeprazole (PRILOSEC) 40 MG capsule TAKE 1 CAPSULE BY MOUTH EVERY DAY 30 capsule 0  . oxyCODONE-acetaminophen (PERCOCET) 10-325 MG tablet Take 1 tablet by mouth every 4 (four) hours. 15 tablet 0  . predniSONE (DELTASONE) 10 MG tablet Take 33m daily for 3days,Take 386mdaily for 3days,Take 2071maily for 3days,Take 76m80mily for 3days, then stop 30 tablet 0  . rosuvastatin (CRESTOR) 20 MG tablet TAKE 1/2 TABLET (10 MG TOTAL) BY MOUTH DAILY. (Patient taking differently: Take 10 mg by mouth daily. ) 90 tablet 0  . sacubitril-valsartan (ENTRESTO) 24-26 MG Take 1 tablet by mouth 2 (two) times daily. 60 tablet 0  . VENTOLIN HFA 108 (90 Base) MCG/ACT inhaler INHALE 1-2 PUFFS EVERY 6  HOURS AS NEEDED 18 g 2  . Vitamin D, Ergocalciferol, (DRISDOL) 1.25 MG (50000 UT) CAPS capsule TAKE ONE CAPSULE BY MOUTH ONCE WEEKLY 12 capsule 0   No current facility-administered medications for this visit.     Allergies:   Penicillins and Cymbalta [duloxetine hcl]    Social History:  The patient  reports that he quit smoking about 8 months ago. His smoking use included cigarettes. He has a 10.25 pack-year smoking history. He has never used smokeless tobacco. He reports current alcohol use. He reports that he does not use drugs.   Family History:  The patient's family history includes Asthma in his brother and sister; COPD in his mother; Diabetes in his brother; Emphysema in his mother; Heart disease in his maternal uncle; Hyperlipidemia in his brother and sister; Hypertension in his brother, brother, and sister; Mental illness in his sister.  He indicated that his mother is alive. He indicated that his father is deceased. He indicated that the status of his sister is unknown. He indicated that the status of his maternal uncle is unknown.    ROS:  Please see the history  of present illness. All other systems are reviewed and negative.    PHYSICAL EXAM: VS:  There were no vitals taken for this visit. , BMI There is no height or weight on file to calculate BMI. GEN: Well nourished, well developed, male in no acute distress HEENT: normal for age  Neck: no JVD, no carotid bruit, no masses Cardiac: RRR; no murmur, no rubs, or gallops Respiratory:  clear to auscultation bilaterally, normal work of breathing GI: soft, nontender, nondistended, + BS MS: no deformity or atrophy; no edema; distal pulses are 2+ in all 4 extremities  Skin: warm and dry, no rash Neuro:  Strength and sensation are intact Psych: euthymic mood, full affect   EKG:  EKG {ACTION; IS/IS NOT:[id number]}ered today. The ekg ordered today demonstrates ***  Echo performed 09/20/2018 LVEF 30-35%, moderately dilated LV with normal wall thickness, severe global hypokinesis, moderate RVE  with mildly reduced RV free wall motion, severe biatrial enlargment, aortic valve sclerosis, trivial MR, dilated IVC that does not collapse, trivial circumferential pericardial effusion  Cardiac cath 09/23/18 . No angiographic evidence of CAD 2. Non-ischemic cardiomyopathy 3. Elevated filling pressures.   Recommendations: Medical management of non-ischemic cardiomyopathy. Pressures suggest continued volume overload. Continue IV diuresis.    Recent Labs: 03/01/2018: TSH 1.110 04/05/2018: Magnesium 2.0 09/20/2018: ALT 23; B Natriuretic Peptide 1,680.8 09/24/2018: BUN 29; Creatinine, Ser 1.17; Hemoglobin 15.0; Platelets 303; Potassium 3.8; Sodium 138  CBC    Component Value Date/Time   WBC 15.0 (H) 09/24/2018 0508   RBC 5.00 09/24/2018 0508   HGB 15.0 09/24/2018 0508   HGB 17.9 (H) 09/19/2017 1133   HCT 49.2 09/24/2018 0508   HCT 53.2 (H) 09/19/2017 1133   PLT 303 09/24/2018 0508   PLT 288 09/19/2017 1133   MCV 98.4 09/24/2018 0508   MCV 87 09/19/2017 1133   MCH 30.0 09/24/2018 0508   MCHC 30.5  09/24/2018 0508   RDW 15.7 (H) 09/24/2018 0508   RDW 14.2 09/19/2017 1133   LYMPHSABS 1.1 09/20/2018 1040   MONOABS 0.3 09/20/2018 1040   EOSABS 0.1 09/20/2018 1040   BASOSABS 0.0 09/20/2018 1040   CMP Latest Ref Rng & Units 09/24/2018 09/23/2018 09/23/2018  Glucose 70 - 99 mg/dL 184(H) - -  BUN 6 - 20 mg/dL 29(H) - -  Creatinine 0.61 - 1.24 mg/dL 1.17 - -  Sodium 135 - 145 mmol/L 138 149(H) 143  Potassium 3.5 - 5.1 mmol/L 3.8 3.4(L) 4.2  Chloride 98 - 111 mmol/L 95(L) - -  CO2 22 - 32 mmol/L 33(H) - -  Calcium 8.9 - 10.3 mg/dL 8.1(L) - -  Total Protein 6.5 - 8.1 g/dL - - -  Total Bilirubin 0.3 - 1.2 mg/dL - - -  Alkaline Phos 38 - 126 U/L - - -  AST 15 - 41 U/L - - -  ALT 0 - 44 U/L - - -     Lipid Panel Lab Results  Component Value Date   CHOL 161 03/01/2018   HDL 40 03/01/2018   LDLCALC 89 03/01/2018   TRIG 140 05/26/2018   CHOLHDL 4.0 03/01/2018      Wt Readings from Last 3 Encounters:  09/23/18 219 lb (99.3 kg)  07/10/18 218 lb 0.6 oz (98.9 kg)  05/26/18 218 lb 1.6 oz (98.9 kg)     Other studies Reviewed: Additional studies/ records that were reviewed today include: ***.  ASSESSMENT AND PLAN:  1.  ***   Current medicines are reviewed at length with the patient today.  The patient {ACTIONS; HAS/DOES NOT HAVE:19233} concerns regarding medicines.  The following changes have been made:  {PLAN; NO CHANGE:13088:s}  Labs/ tests ordered today include: *** No orders of the defined types were placed in this encounter.    Disposition:   FU with Minus Breeding, MD  Signed, Rosaria Ferries, PA-C  10/08/2018 7:50 AM    [place] Phone: [phone]; Fax: [phone]

## 2018-10-21 ENCOUNTER — Telehealth: Payer: Self-pay | Admitting: Cardiology

## 2018-10-21 ENCOUNTER — Other Ambulatory Visit: Payer: Self-pay

## 2018-10-21 NOTE — Telephone Encounter (Signed)
Called patient, discussed his discharge instructions- patient has not been taking the Metoprolol has he was unaware of the new medication, patient no showed his last appointment with PA due to having multiple appts on the same day- He needed a follow up from his CATH visit- and refills on his medications, plus not starting a medication- I advised he should be seen in office. Made same day appointment with NP on Wednesday- for post cath follow up.  Patient verbalized understanding.

## 2018-10-21 NOTE — Telephone Encounter (Signed)
New Message  Patient states that he was told to stop taking some medications and to start taking Entresto. Patient is unsure which medications he should be taking. Patient states that the only medication he is taking is Furosemide and Entresto.  Please give patient a call to go over her medications.

## 2018-10-21 NOTE — Telephone Encounter (Signed)
Patient needed post cath follow up- to discuss his medications. He had not started his Metoprolol as he was unaware to do so- also no showed his last appointment, due to other scheduling conflicts- patient needed post cath follow up appointment, used same day spot for NP on Wednesday- patient verbalized understanding.

## 2018-10-23 ENCOUNTER — Ambulatory Visit: Payer: Medicare HMO | Admitting: Adult Health

## 2018-10-23 NOTE — Progress Notes (Deleted)
Cardiology Clinic Note   Patient Name: Scott [name] Date of Encounter: 10/23/2018  Primary Care Provider:  Fredrich Romans, [place] Primary Cardiologist:  Minus Breeding, MD  Patient Profile    ***  Past Medical History    Past Medical History:  Diagnosis Date  . 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 COPD (chronic obstructive pulmonary disease) ([place])   . Diabetes mellitus without complication ([place])   . GERD (gastroesophageal reflux disease)   . Headache(784.0)   . Pneumonia    hx   Past Surgical History:  Procedure Laterality Date  . ANTERIOR CERVICAL DECOMP/DISCECTOMY FUSION  12/22/2010   Procedure: ANTERIOR CERVICAL DECOMPRESSION/DISCECTOMY FUSION 3 LEVELS;  Surgeon: Ophelia Charter;  Location: [facility];  Service: Neurosurgery;  Laterality: N/A;  Anterior cervical discectomy with fusion cervical three-four, four-five, and five sixCDF with Interbody Prosthesis, plating, and Bone Graft   . ANTERIOR CERVICAL DECOMP/DISCECTOMY FUSION N/A 10/16/2012   Procedure: CERVICAL SIX-SEVEN ANTERIOR CERVICAL DECOMPRESSION/DISCECTOMY FUSION WITH INTERBODY PROTHESIS PLATING BONEGRAFT WITH /POSSIBLE HARDWARE REMOVAL OLD PLATE;  Surgeon: Ophelia Charter, MD;  Location: [facility];  Service: Neurosurgery;  Laterality: N/A;  . MULTIPLE TOOTH EXTRACTIONS    . OTHER SURGICAL HISTORY     surgery on cheekbone and head for fall 2000  . 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 RIGHT/LEFT HEART CATH AND CORONARY ANGIOGRAPHY N/A 09/23/2018   Procedure: RIGHT/LEFT HEART CATH AND CORONARY ANGIOGRAPHY and possible PCI/stent;  Surgeon: MBurnell Blanks MD;  [facility];  Service: Cardiovascular;  Laterality: N/A;  . SPINE SURGERY     2014 and 2016 - Dr JArnoldo Morale . TONSILLECTOMY      Allergies  Allergies  Allergen Reactions  . Penicillins Anaphylaxis    Has patient had a PCN reaction causing immediate rash, facial/tongue/throat swelling, SOB or lightheadedness with hypotension: Yes Has patient had a PCN reaction causing severe rash involving mucus membranes or skin necrosis: Yes Has patient had a PCN reaction that required hospitalization: Yes Has patient had a PCN reaction occurring within the last 10 years: No If all of the above answers are "NO", then may proceed with Cephalosporin use.   .Marland KitchenCymbalta [Duloxetine Hcl]     "crazy thoughts"    History of Present Illness    ***  Home Medications    Prior to Admission medications   Medication Sig Start Date End Date Taking? Authorizing Provider  ADVAIR DISKUS 250-50 MCG/DOSE AEPB INHALE 1 PUFF INTO THE LUNGS TWICE DAILY 09/27/18   BCollene Gobble MD  aspirin 325 MG EC tablet Take 325 mg by mouth every 6 (six) hours as needed for pain.    [provider]  COMBIVENT RESPIMAT 20-100 MCG/ACT AERS respimat INHALE 1 PUFF BY MOUTH INTO THE LUNGS FOUR TIMES A DAY Patient taking differently: Inhale 2 puffs into the lungs 4 (four) times daily.  05/08/18   BCollene Gobble MD  cyclobenzaprine (FLEXERIL) 10 MG tablet  08/26/18   [provider]  diazepam (VALIUM) 5 MG tablet Take 5 mg by mouth 2 (two) times daily as needed for anxiety. 08/07/18   [provider]  FLUoxetine (PROZAC) 10 MG capsule  08/07/18   [provider]  furosemide (LASIX) 40 MG tablet Take 1 tablet (40 mg total) by  mouth daily. 09/25/18   Lavina Hamman, MD  gabapentin (NEURONTIN) 300 MG capsule TAKE 1 CAPSULE BY MOUTH 3 TIMES DAILY 09/03/18   Rutherford Guys, MD  ipratropium-albuterol (DUONEB) 0.5-2.5 (3) MG/3ML SOLN INHALE THE CONTENTS OF 1 VIAL VIA NEBULIZER EVERY 6 HOURS AS DIRECTED FOR SHORTNESS OF BREATH OR WHEEZING Patient taking differently: Inhale 3 mLs into the lungs 4 (four) times daily. INHALE THE CONTENTS OF 1 VIAL VIA NEBULIZER EVERY 6 HOURS AS DIRECTED FOR SHORTNESS OF BREATH OR  WHEEZING 04/10/18   Collene Gobble, MD  JANUMET 50-500 MG tablet TAKE 1 TABLET BY MOUTH ONCE A DAY 07/31/18   Rutherford Guys, MD  metoprolol succinate (TOPROL-XL) 25 MG 24 hr tablet Take 1 tablet (25 mg total) by mouth daily. 09/25/18   Lavina Hamman, MD  NARCAN 4 MG/0.1ML LIQD nasal spray kit  06/13/18   [provider]  nicotine polacrilex (NICORETTE) 2 MG gum CHEW 1 PIECE BY MOUTH AS NEEDED FOR SMOKING CESSATION Patient not taking: Reported on 09/20/2018 03/13/18   Rutherford Guys, MD  nystatin (MYCOSTATIN) 100000 UNIT/ML suspension Take 5 mLs (500,000 Units total) by mouth 4 (four) times daily. Swish and swallow Patient not taking: Reported on 09/20/2018 06/19/18   Rutherford Guys, MD  omeprazole (PRILOSEC) 40 MG capsule TAKE 1 CAPSULE BY MOUTH EVERY DAY 09/03/18   Rutherford Guys, MD  oxyCODONE-acetaminophen (PERCOCET) 10-325 MG tablet Take 1 tablet by mouth every 4 (four) hours. 05/28/18   Desiree Hane, MD  predniSONE (DELTASONE) 10 MG tablet Take 93m daily for 3days,Take 365mdaily for 3days,Take 2049maily for 3days,Take 28m71mily for 3days, then stop 09/25/18   PateLavina Hamman  rosuvastatin (CRESTOR) 20 MG tablet TAKE 1/2 TABLET (10 MG TOTAL) BY MOUTH DAILY. Patient taking differently: Take 10 mg by mouth daily.  07/31/18   SantRutherford Guys  sacubitril-valsartan (ENTRESTO) 24-26 MG Take 1 tablet by mouth 2 (two) times daily. 09/24/18   PateLavina Hamman  VENTOLIN HFA 108 ([phone] [id number]) MCG/ACT inhaler INHALE 1-2 PUFFS EVERY 6  HOURS AS NEEDED 09/27/18   ByruCollene Gobble  Vitamin D, Ergocalciferol, (DRISDOL) 1.25 MG (50000 UT) CAPS capsule TAKE ONE CAPSULE BY MOUTH ONCE WEEKLY 09/03/18   SantRutherford Guys    Family History    Family History  Problem Relation Age of Onset  . Emphysema Mother   . COPD Mother   . Asthma Brother   . Diabetes Brother   . Hyperlipidemia Brother   . Hypertension Brother   . Asthma Sister   . Hyperlipidemia Sister   . Hypertension Sister    . Mental illness Sister   . Heart disease Maternal Uncle   . Hypertension Brother    He indicated that his mother is alive. He indicated that his father is deceased. He indicated that the status of his sister is unknown. He indicated that the status of his maternal uncle is unknown.  Social History    Social History   Socioeconomic History  . Marital status: Married    Spouse name: Not on file  . Number of children: 4  . Years of education: Not on file  . Highest education level: Not on file  Occupational History  . Occupation: DISABLED  Social Needs  . Financial resource strain: Not on file  . Food insecurity    Worry: Not on file    Inability: Not on file  . Transportation needs  Medical: Not on file    Non-medical: Not on file  Tobacco Use  . Smoking status: Former Smoker    Packs/day: 0.25    Years: 41.00    Pack years: 10.25    Types: Cigarettes    Quit date: 01/21/2018    Years since quitting: 0.7  . Smokeless tobacco: Never Used  Substance and Sexual Activity  . Alcohol use: Yes    Comment: Beer occasionally  . Drug use: No  . Sexual activity: Not Currently  Lifestyle  . Physical activity    Days per week: Not on file    Minutes per session: Not on file  . Stress: Not on file  Relationships  . Social Herbalist on phone: Not on file    Gets together: Not on file    Attends religious service: Not on file    Active member of club or organization: Not on file    Attends meetings of clubs or organizations: Not on file    Relationship status: Not on file  . Intimate partner violence    Fear of current or ex partner: Not on file    Emotionally abused: Not on file    Physically abused: Not on file    Forced sexual activity: Not on file  Other Topics Concern  . Not on file  Social History Narrative  . Not on file     Review of Systems    General:  No chills, fever, night sweats or weight changes.  Cardiovascular:  No chest pain, dyspnea  on exertion, edema, orthopnea, palpitations, paroxysmal nocturnal dyspnea. Dermatological: No rash, lesions/masses Respiratory: No cough, dyspnea Urologic: No hematuria, dysuria Abdominal:   No nausea, vomiting, diarrhea, bright red blood per rectum, melena, or hematemesis Neurologic:  No visual changes, wkns, changes in mental status. All other systems reviewed and are otherwise negative except as noted above.  Physical Exam    VS:  There were no vitals taken for this visit. , BMI There is no height or weight on file to calculate BMI. GEN: Well nourished, well developed, in no acute distress. HEENT: normal. Neck: Supple, no JVD, carotid bruits, or masses. Cardiac: RRR, no murmurs, rubs, or gallops. No clubbing, cyanosis, edema.  Radials/DP/PT 2+ and equal bilaterally.  Respiratory:  Respirations regular and unlabored, clear to auscultation bilaterally. GI: Soft, nontender, nondistended, BS + x 4. MS: no deformity or atrophy. Skin: warm and dry, no rash. Neuro:  Strength and sensation are intact. Psych: Normal affect.  Accessory Clinical Findings    ECG personally reviewed by me today- *** - No acute changes  Assessment & Plan   1.  ***  Deberah Pelton, NP 10/23/2018, 7:46 AM

## 2018-10-24 ENCOUNTER — Encounter: Payer: Self-pay | Admitting: Adult Health

## 2018-10-24 ENCOUNTER — Telehealth (INDEPENDENT_AMBULATORY_CARE_PROVIDER_SITE_OTHER): Payer: Medicare HMO | Admitting: Adult Health

## 2018-10-24 VITALS — Ht 68.0 in | Wt 214.7 lb

## 2018-10-24 DIAGNOSIS — J441 Chronic obstructive pulmonary disease with (acute) exacerbation: Secondary | ICD-10-CM

## 2018-10-24 DIAGNOSIS — Z79899 Other long term (current) drug therapy: Secondary | ICD-10-CM | POA: Diagnosis not present

## 2018-10-24 DIAGNOSIS — I428 Other cardiomyopathies: Secondary | ICD-10-CM

## 2018-10-24 DIAGNOSIS — I5023 Acute on chronic systolic (congestive) heart failure: Secondary | ICD-10-CM

## 2018-10-24 DIAGNOSIS — I10 Essential (primary) hypertension: Secondary | ICD-10-CM

## 2018-10-24 MED ORDER — SACUBITRIL-VALSARTAN 24-26 MG PO TABS: 1.0000 | ORAL_TABLET | Freq: Two times a day (BID) | ORAL | 3 refills | Status: DC

## 2018-10-24 MED ORDER — FUROSEMIDE 40 MG PO TABS: 40.0000 mg | ORAL_TABLET | Freq: Every day | ORAL | 3 refills | Status: DC

## 2018-10-24 MED ORDER — METOPROLOL SUCCINATE ER 25 MG PO TB24: 25.0000 mg | ORAL_TABLET | Freq: Every day | ORAL | 3 refills | Status: DC

## 2018-10-24 NOTE — Progress Notes (Signed)
Virtual Visit via Telephone Note   This visit type was conducted due to national recommendations for restrictions regarding the COVID-19 Pandemic (e.g. social distancing) in an effort to limit this patient's exposure and mitigate transmission in our community.  Due to his co-morbid illnesses, this patient is at least at moderate risk for complications without adequate follow up.  This format is felt to be most appropriate for this patient at this time.  The patient did not have access to video technology/had technical difficulties with video requiring transitioning to audio format only (telephone).  All issues noted in this document were discussed and addressed.  No physical exam could be performed with this format.  Please refer to the patient's chart for his  consent to telehealth for [facility]   Date:  10/24/2018   ID:  Scott [name], DOB [birth year], MRN [id number]  Patient Location: Home Provider Location: Home  PCP:  Fredrich Romans, [place]  Cardiologist:  [name] Breeding, MD  Electrophysiologist:  None   Evaluation Performed:  Follow-Up Visit  Chief Complaint:  Hospital follow up, ICM  History of Present Illness:    Scott [name] is a 58 y.o. male we are following for ongoing assessment and management of acute on chronic systolic heart failure, nonischemic cardiomyopathy, status post hospitalization for acute respiratory failure with hypoxemia.  [name] has a history of COPD, diabetes followed by [facility].  He underwent both right and left cardiac catheterization which revealed no angiographic evidence of CAD., with right heart cath revealing volume overload.  He was also treated for right lower lobe pneumonia with Levaquin.  He tested COVID negative  During recent hospitalization the patient was found to have COPD exacerbation, echocardiogram was completed revealing an EF of 30% to 35%.  The patient was started on Entresto 24/26 mg twice daily and lisinopril was  discontinued.  He was continued on home O2 via nasal cannula at 2 L which she wears at nighttime.  He was advised to stop carvedilol, hydrochlorothiazide and lisinopril, sent home on furosemide 40 mg daily, started on metoprolol 25 mg daily.  He was also sent home on a steroid Dosepak.  Weight on discharge 99.3 kg.  Today via telephone visit, the patient is doing well.  He states that his breathing status has improved.  He denies any fluid overload with lower extremity edema, PND, or orthopnea.  He is medically compliant and has his medications with him as we are talking.  His weight is down about 3 pounds since leaving the hospital.  He has a scale provided to him by his insurance company, and weighs himself while were on the phone.  He is sending for a blood pressure machine through his insurance company and has not yet received it.  He has not had any side effects from the medication changes.  He denies any swelling, trouble breathing, dizziness, rapid heart rhythm, or nausea.  He continues to have good response from Lasix dose.  The patient does not have symptoms concerning for COVID-19 infection (fever, chills, cough, or new shortness of breath).    Past Medical History:  Diagnosis Date  . 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 COPD (chronic obstructive pulmonary disease) ([place])   . Diabetes mellitus without complication ([place])   . GERD (gastroesophageal reflux disease)   . Headache(784.0)   . Pneumonia    hx   Past Surgical History:  Procedure Laterality Date  . ANTERIOR CERVICAL  DECOMP/DISCECTOMY FUSION  12/22/2010   Procedure: ANTERIOR CERVICAL DECOMPRESSION/DISCECTOMY FUSION 3 LEVELS;  Surgeon: Ophelia Charter;  Location: [place] [facility];  Service: Neurosurgery;  Laterality: N/A;  Anterior cervical discectomy with fusion cervical three-four, four-five, and five sixCDF with Interbody Prosthesis, plating, and Bone Graft   . ANTERIOR CERVICAL DECOMP/DISCECTOMY  FUSION N/A 10/16/2012   Procedure: CERVICAL SIX-SEVEN ANTERIOR CERVICAL DECOMPRESSION/DISCECTOMY FUSION WITH INTERBODY PROTHESIS PLATING BONEGRAFT WITH /POSSIBLE HARDWARE REMOVAL OLD PLATE;  Surgeon: Ophelia Charter, MD;  Location: [place] [facility];  Service: Neurosurgery;  Laterality: N/A;  . MULTIPLE TOOTH EXTRACTIONS    . OTHER SURGICAL HISTORY     surgery on cheekbone and head for fall 2000  . 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 RIGHT/LEFT HEART CATH AND CORONARY ANGIOGRAPHY N/A 09/23/2018   Procedure: RIGHT/LEFT HEART CATH AND CORONARY ANGIOGRAPHY and possible PCI/stent;  Surgeon: MBurnell Blanks MD;  [facility];  Service: Cardiovascular;  Laterality: N/A;  . SPINE SURGERY     2014 and 2016 - Dr JArnoldo Morale . TONSILLECTOMY       Current Meds  Medication Sig  . ADVAIR DISKUS 250-50 MCG/DOSE AEPB INHALE 1 PUFF INTO THE LUNGS TWICE DAILY  . aspirin 325 MG EC tablet Take 325 mg by mouth every 6 (six) hours as needed for pain.  . COMBIVENT RESPIMAT 20-100 MCG/ACT AERS respimat INHALE 1 PUFF BY MOUTH INTO THE LUNGS FOUR TIMES A DAY (Patient taking differently: Inhale 2 puffs into the lungs 4 (four) times daily. )  . diazepam (VALIUM) 5 MG tablet Take 5 mg by mouth 2 (two) times daily as needed for anxiety.  .Marland KitchenFLUoxetine (PROZAC) 10 MG capsule   . furosemide (LASIX) 40 MG tablet Take 1 tablet (40 mg total) by mouth daily.  .Marland Kitchengabapentin (NEURONTIN) 300 MG capsule TAKE 1 CAPSULE BY MOUTH 3 TIMES DAILY  . ipratropium-albuterol (DUONEB) 0.5-2.5 (3) MG/3ML SOLN INHALE THE CONTENTS OF 1 VIAL VIA NEBULIZER EVERY 6 HOURS AS DIRECTED FOR SHORTNESS OF BREATH OR WHEEZING (Patient taking differently: Inhale 3 mLs into the lungs 4 (four) times daily. INHALE THE CONTENTS OF 1 VIAL VIA NEBULIZER EVERY 6 HOURS AS DIRECTED FOR SHORTNESS OF BREATH OR WHEEZING)  . JANUMET 50-500 MG tablet  TAKE 1 TABLET BY MOUTH ONCE A DAY  . metoprolol succinate (TOPROL-XL) 25 MG 24 hr tablet Take 1 tablet (25 mg total) by mouth daily.  .Marland KitchenNARCAN 4 MG/0.1ML LIQD nasal spray kit   . omeprazole (PRILOSEC) 40 MG capsule TAKE 1 CAPSULE BY MOUTH EVERY DAY  . oxyCODONE-acetaminophen (PERCOCET) 10-325 MG tablet Take 1 tablet by mouth every 4 (four) hours.  . rosuvastatin (CRESTOR) 20 MG tablet TAKE 1/2 TABLET (10 MG TOTAL) BY MOUTH DAILY. (Patient taking differently: Take 10 mg by mouth daily. )  . sacubitril-valsartan (ENTRESTO) 24-26 MG Take 1 tablet by mouth 2 (two) times daily.  . VENTOLIN HFA 108 (90 Base) MCG/ACT inhaler INHALE 1-2 PUFFS EVERY 6  HOURS AS NEEDED  . Vitamin D, Ergocalciferol, (DRISDOL) 1.25 MG (50000 UT) CAPS capsule TAKE ONE CAPSULE BY MOUTH ONCE WEEKLY     Allergies:   Penicillins and Cymbalta [duloxetine hcl]   Social History   Tobacco Use  . Smoking status: Former Smoker    Packs/day: 0.25    Years: 41.00    Pack years: 10.25  Types: Cigarettes    Quit date: 01/21/2018    Years since quitting: 0.7  . Smokeless tobacco: Never Used  Substance Use Topics  . Alcohol use: Yes    Comment: Beer occasionally  . Drug use: No     Family Hx: The patient's family history includes Asthma in his brother and sister; COPD in his mother; Diabetes in his brother; Emphysema in his mother; Heart disease in his maternal uncle; Hyperlipidemia in his brother and sister; Hypertension in his brother, brother, and sister; Mental illness in his sister.  ROS:   Please see the history of present illness.    All other systems reviewed and are negative.   Prior CV studies:   The following studies were reviewed today: Echocardiogram 09/20/2018  1. The left ventricle has moderate-severely reduced systolic function, with an ejection fraction of 30-35%. The cavity size was moderately dilated. Indeterminate diastolic filling due to E-A fusion. Left ventricular diffuse hypokinesis.  2. The  right ventricle has mildly reduced systolic function. The cavity was moderately enlarged. There is no increase in right ventricular wall thickness.  3. Left atrial size was severely dilated.  4. Right atrial size was severely dilated.  5. The pericardial effusion is circumferential.  6. Trivial pericardial effusion is present.  7. The mitral valve is abnormal. Moderate thickening of the mitral valve leaflet.  8. The tricuspid valve is grossly normal.  9. The aortic valve is tricuspid. Mild sclerosis of the aortic valve. No stenosis of the aortic valve. 10. The aorta is normal unless otherwise noted. 11. The inferior vena cava was dilated in size with <50% respiratory variability.  Right and Left Heart Cath 09/23/2018 1. No angiographic evidence of CAD 2. Non-ischemic cardiomyopathy 3. Elevated filling pressures.   Recommendations: Medical management of non-ischemic cardiomyopathy. Pressures suggest continued volume overload. Continue IV diuresis.   Labs/Other Tests and Data Reviewed:    EKG:  No ECG reviewed.  Recent Labs: 03/01/2018: TSH 1.110 04/05/2018: Magnesium 2.0 09/20/2018: ALT 23; B Natriuretic Peptide 1,680.8 09/24/2018: BUN 29; Creatinine, Ser 1.17; Hemoglobin 15.0; Platelets 303; Potassium 3.8; Sodium 138   Recent Lipid Panel Lab Results  Component Value Date/Time   CHOL 161 03/01/2018 09:58 AM   TRIG 140 05/26/2018 12:09 PM   HDL 40 03/01/2018 09:58 AM   CHOLHDL 4.0 03/01/2018 09:58 AM   LDLCALC 89 03/01/2018 09:58 AM    Wt Readings from Last 3 Encounters:  10/24/18 219 lb (99.3 kg)  09/23/18 219 lb (99.3 kg)  07/10/18 218 lb 0.6 oz (98.9 kg)     Objective:    Vital Signs:  Ht '5\' 8"'  (1.727 m)   Wt 219 lb (99.3 kg)   BMI 33.30 kg/m    VITAL SIGNS:  reviewed GEN:  no acute distress RESPIRATORY:  normal respiratory effort, symmetric expansion NEURO:  alert and oriented x 3, no obvious focal deficit PSYCH:  normal affect  ASSESSMENT & PLAN:    1.  Nonischemic cardiomyopathy: Echocardiogram revealing EF of 30% to 35% with normal coronary arteries per catheterization.  The patient is currently on Entresto 24/26 mg, taken off of carvedilol and changed to metoprolol in the setting of COPD for cardio specific beta-blocker, remains on furosemide 40 mg daily.  I have discussed with the patient low-salt diet, the need to weigh every day and record the weight, call us for weight gain of 3 to 5 pounds.  I have also discussed his heart function and decreased heart strength which we hope to improve  over the next several months with medication adherence and moving to optimal dosage.    He is to call us today after picking up his medications with his blood pressure as he will have it completed at the pharmacy.  I have given him parameters of blood pressures goal of less than 120/70.  He will record weights and blood pressures each day and bring them with him to follow-up appointment.  He is given information on a low-sodium diet, and sent via mail "the salty 6" which lists 6 foods that are extremely high in sodium so that he may avoid them.  2.  Hypertension: He is going to have his blood pressure checked today when he goes to the pharmacy.  I have given him parameters as above.  May consider titrating Entresto if blood pressure remains elevated.  He will have a follow-up BMET prior to next office visit.  May consider adding spironolactone if kidney function allows to optimize medical management.  3.  COPD: Continue to follow his primary care physician with nebulizer treatments and inhalers as directed.  Wearing O2 at at bedtime via nasal cannula at 2 L.   COVID-19 Education: The signs and symptoms of COVID-19 were discussed with the patient and how to seek care for testing (follow up with PCP or arrange E-visit).  The importance of social distancing was discussed today.  Time:   Today, I have spent 25 minutes with the patient with telehealth technology  discussing the above problems.     Medication Adjustments/Labs and Tests Ordered: Current medicines are reviewed at length with the patient today.  Concerns regarding medicines are outlined above.   Tests Ordered: No orders of the defined types were placed in this encounter.   Medication Changes: No orders of the defined types were placed in this encounter.   Disposition:  Follow up keep previously scheduled appointment with Dr. Percival Spanish on November 5 at 9 AM.  Signed, Phill Myron. West Pugh, ANP, [place]  10/24/2018 8:36 AM    [facility]

## 2018-10-24 NOTE — Patient Instructions (Addendum)
Medication Instructions:  Continue current medications  If you need a refill on your cardiac medications before your next appointment, please call your pharmacy.  Labwork: BMP in 3 weeks HERE IN OUR OFFICE AT [facility]  You will NOT need to fast   Take the provided lab slips with you to the lab for your blood draw.   When you have your labs (blood work) drawn today and your tests are completely normal, you will receive your results only by MyChart Message (if you have MyChart) -OR-  A paper copy in the mail.  If you have any lab test that is abnormal or we need to change your treatment, we will call you to review these results.  Testing/Procedures: None Ordered  Follow-Up: . Your physician recommends that you schedule a follow-up appointment in: Keep appointment with Dr Percival Spanish on November 5th   At [facility], you and your health needs are our priority.  As part of our continuing mission to provide you with exceptional heart care, we have created designated Provider Care Teams.  These Care Teams include your primary Cardiologist (physician) and Advanced Practice Providers (APPs -  Physician Assistants and Nurse Practitioners) who all work together to provide you with the care you need, when you need it.  Thank you for choosing [facility] at [facility]!!

## 2018-10-28 ENCOUNTER — Encounter: Payer: Self-pay | Admitting: Pulmonary Disease

## 2018-10-28 ENCOUNTER — Ambulatory Visit: Payer: Medicare HMO | Admitting: Emergency Medicine

## 2018-10-28 ENCOUNTER — Other Ambulatory Visit: Payer: Self-pay

## 2018-10-28 ENCOUNTER — Ambulatory Visit (INDEPENDENT_AMBULATORY_CARE_PROVIDER_SITE_OTHER): Payer: Medicare HMO | Admitting: Pulmonary Disease

## 2018-10-28 VITALS — BP 124/84 | HR 99 | Temp 97.4°F | Ht 68.0 in | Wt 220.8 lb

## 2018-10-28 DIAGNOSIS — I5023 Acute on chronic systolic (congestive) heart failure: Secondary | ICD-10-CM

## 2018-10-28 DIAGNOSIS — J449 Chronic obstructive pulmonary disease, unspecified: Secondary | ICD-10-CM

## 2018-10-28 DIAGNOSIS — I428 Other cardiomyopathies: Secondary | ICD-10-CM | POA: Diagnosis not present

## 2018-10-28 DIAGNOSIS — F17200 Nicotine dependence, unspecified, uncomplicated: Secondary | ICD-10-CM

## 2018-10-28 DIAGNOSIS — F17210 Nicotine dependence, cigarettes, uncomplicated: Secondary | ICD-10-CM

## 2018-10-28 DIAGNOSIS — G4734 Idiopathic sleep related nonobstructive alveolar hypoventilation: Secondary | ICD-10-CM | POA: Diagnosis not present

## 2018-10-28 MED ORDER — NICOTINE 14 MG/24HR TD PT24: 14.0000 mg | MEDICATED_PATCH | Freq: Every day | TRANSDERMAL | 3 refills | Status: DC

## 2018-10-28 MED ORDER — NICOTINE POLACRILEX 4 MG MT LOZG: 4.0000 mg | LOZENGE | OROMUCOSAL | 3 refills | Status: DC | PRN

## 2018-10-28 NOTE — Assessment & Plan Note (Signed)
Plan: Continue furosemide Continue Entresto Continue to weigh yourself daily Blood pressure is stable today I will route my note to cardiology to keep them updated Please notify cardiology if you start retaining fluid or weights are increasing Follow low-sodium diet Keep scheduled office visit November/2020

## 2018-10-28 NOTE — Assessment & Plan Note (Signed)
Plan: Continue oxygen therapy at night Walk today in office did not show any oxygen desaturations with exertion

## 2018-10-28 NOTE — Assessment & Plan Note (Signed)
Plan: Nicotine patch 14 mg prescribed today Nicotine lozenge 4 mg prescribed today Referral to lung cancer screening clinic, will need shared decision-making in May/2021 You must stop smoking Referral to [facility] Referral to clinical pharmacy team

## 2018-10-28 NOTE — Progress Notes (Addendum)
_0  ID: Scott [name], male    DOB: 21-Oct-1960, 58 y.o.   MRN: [id number]  Chief Complaint  Patient presents with  . Follow-up    COPD, recently hosp for acute on chronic chf    Referring provider: Fredrich Romans, PA  HPI:  58 year old male current every day smoker followed in our office for severe COPD and nocturnal hypoxia  PMH: CHF, cardiomyopathy, reduced ejection fraction, GERD Smoker/ Smoking History: Current everyday smoker Maintenance: Advair 250 Pt of: Dr. Lamonte Sakai  10/28/2018  - Visit   58 year old male current everyday smoker presenting to our office today as a hospital follow-up.  Patient was recently in the hospital for acute on chronic congestive heart failure exacerbation.  Patient continues to smoke 8 cigarettes a day.  He had previously stopped smoking for 4 months at the beginning of 2020.  He reports that when he tested positive for COVID earlier this year it caused him to resume starting smoking.  Patient is maintained on Advair 250.  He also has Dulera 200 with him today that he received from the pharmacy at the hospital.  He is confused on which one he needs to be taking.  Patient reports that he is interested in stopping smoking.  He would like prescriptions for nicotine replacement therapies.  He is already completed a tele-visit with cardiology the beginning of this month and he has a in person office visit in November/2020.  Patient reports that he is started weighing himself every morning.  He received a scale from his [employer].  Cardiology has given him specific instructions on when to notify them if he is gaining weight.     Chart Review:   09/20/2018- inpatient admission for acute on chronic respiratory failure, acute systolic CHF from nonischemic cardia myopathy Discharge date: 09/24/2018 Plan: Start lisinopril, right lower lobe pneumonia treated with Levaquin  10/24/2018-telemedicine visit-heart care-[place] NP Plan: Continue  furosemide 40 mg daily, weigh yourself every day, monitor for weight increases, follow low-sodium diet, follow-up in November/05/2018 with Dr. Percival Spanish  Questionaires / Pulmonary Flowsheets:   MMRC: mMRC Dyspnea Scale mMRC Score  10/28/2018 3    Tests:   05/26/2018-CTA chest-emphysematous changes most severe at apices, peribronchial thickening, scattered areas of mucous plugging in the lower lobes, bibasilar atelectasis, no evidence of PE  09/20/2018-echocardiogram-LV ejection fraction 30 to 35%, left atrial size severely dilated, right atrial size severely dilated, pericardial effusion is circumferential, RV has mildly reduced systolic function, cavity was moderately enlarged  Right and Left Heart Cath 09/23/2018 1. No angiographic evidence of CAD 2. Non-ischemic cardiomyopathy 3. Elevated filling pressures.  02/07/2018-split-night sleep study- no significant obstructive or central sleep apnea.  CPAP therapy not indicated.  Patient should continue home nighttime oxygen.  11/17/2016-pulmonary function test- FVC 2.87 (74% predicted), postbronchodilator ratio 45, postbronchodilator FEV1 1.39 (45% predicted), positive bronchodilator response in the FEV1, mid flow reversibility, DLCO 18.7 (61% predicted)   FENO:  No results found for: NITRICOXIDE  PFT: PFT Results Latest Ref Rng & Units 11/17/2016  FVC-Pre L 2.87  FVC-Predicted Pre % 74  FVC-Post L 3.06  FVC-Predicted Post % 78  Pre FEV1/FVC % % 43  Post FEV1/FCV % % 45  FEV1-Pre L 1.24  FEV1-Predicted Pre % 40  FEV1-Post L 1.39  DLCO UNC% % 61  DLCO COR %Predicted % 69    WALK:  SIX MIN WALK 10/28/2018 10/05/2017 11/17/2016  Supplimental Oxygen during Test? (L/min) No No Yes  O2 Flow Rate - - 2  Type - -  Continuous  Tech Comments: Patient was able to complete 2 laps. Patient had to stop towards the end of the 1st lap to SOB. Patient rested for about a minute but wanted to resume walk. He walked at a steady pace. No O2 needed  during or after walk. - steady walk, 3 laps, desat at 2nd lap, 2L maintained 91% TA/CMA    Imaging: No results found.  Lab Results:  CBC    Component Value Date/Time   WBC 15.0 (H) 09/24/2018 0508   RBC 5.00 09/24/2018 0508   HGB 15.0 09/24/2018 0508   HGB 17.9 (H) 09/19/2017 1133   HCT 49.2 09/24/2018 0508   HCT 53.2 (H) 09/19/2017 1133   PLT 303 09/24/2018 0508   PLT 288 09/19/2017 1133   MCV 98.4 09/24/2018 0508   MCV 87 09/19/2017 1133   MCH 30.0 09/24/2018 0508   MCHC 30.5 09/24/2018 0508   RDW 15.7 (H) 09/24/2018 0508   RDW 14.2 09/19/2017 1133   LYMPHSABS 1.1 09/20/2018 1040   MONOABS 0.3 09/20/2018 1040   EOSABS 0.1 09/20/2018 1040   BASOSABS 0.0 09/20/2018 1040    BMET    Component Value Date/Time   NA 138 09/24/2018 0508   NA 143 03/01/2018 0958   K 3.8 09/24/2018 0508   CL 95 (L) 09/24/2018 0508   CO2 33 (H) 09/24/2018 0508   GLUCOSE 184 (H) 09/24/2018 0508   BUN 29 (H) 09/24/2018 0508   BUN 13 03/01/2018 0958   CREATININE 1.17 09/24/2018 0508   CALCIUM 8.1 (L) 09/24/2018 0508   GFRNONAA >60 09/24/2018 0508   GFRAA >60 09/24/2018 0508    BNP    Component Value Date/Time   BNP 1,680.8 (H) 09/20/2018 1040    ProBNP    Component Value Date/Time   PROBNP 45.2 01/19/2013 1654    Specialty Problems      Pulmonary Problems   Allergic rhinitis   COPD (chronic obstructive pulmonary disease) (HCC)   Acute bronchitis   Nocturnal hypoxia   COPD with acute exacerbation (HCC)   Acute respiratory failure with hypoxia (HCC)      Allergies  Allergen Reactions  . Penicillins Anaphylaxis    Has patient had a PCN reaction causing immediate rash, facial/tongue/throat swelling, SOB or lightheadedness with hypotension: Yes Has patient had a PCN reaction causing severe rash involving mucus membranes or skin necrosis: Yes Has patient had a PCN reaction that required hospitalization: Yes Has patient had a PCN reaction occurring within the last 10 years:  No If all of the above answers are "NO", then may proceed with Cephalosporin use.   Marland Kitchen Cymbalta [Duloxetine Hcl]     "crazy thoughts"    Immunization History  Administered Date(s) Administered  . Influenza Split 11/24/2010, 12/11/2011  . Influenza,inj,Quad PF,6+ Mos 12/11/2014, 10/05/2017  . Pneumococcal Polysaccharide-23 09/25/2017    Past Medical History:  Diagnosis Date  . 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 COPD (chronic obstructive pulmonary disease) ([place])   . Diabetes mellitus without complication ([place])   . GERD (gastroesophageal reflux disease)   . Headache(784.0)   . Pneumonia    hx    Tobacco History: Social History   Tobacco Use  Smoking Status Current Every Day Smoker  . Packs/day: 1.00  . Years: 41.00  . Pack years: 41.00  . Types: Cigarettes  . Start date: 01/23/1973  Smokeless Tobacco Never Used  Tobacco Comment   8-9 cigs a day  Ready to quit: Yes Counseling given: Yes Comment: 8-9 cigs a day   Smoking assessment and cessation counseling  Patient currently smoking: 8-9 cigarettes  I have advised the patient to quit/stop smoking as soon as possible due to high risk for multiple medical problems.  It will also be very difficult for [place] to manage patient's  respiratory symptoms and status if we continue to expose her lungs to a known irritant.  We do not advise e-cigarettes as a form of stopping smoking.  Patient is or is not willing to quit smoking. Setting today as a quit date.   I have advised the patient that we can assist and have options of nicotine replacement therapy, provided smoking cessation education today, provided smoking cessation counseling, and provided cessation resources. Has used NRT patches in the past. Didn't feel that it worked. Currently taking wellbutrin.   Interested in resuming patches and gum. Doesn't need cigarette in first 27mn of waking up.   Follow-up next office visit office visit for  assessment of smoking cessation.  Smoking cessation counseling advised for: 12 min    Outpatient Encounter Medications as of 10/28/2018  Medication Sig  . aspirin 325 MG EC tablet Take 81 mg by mouth every 6 (six) hours as needed for pain.  . COMBIVENT RESPIMAT 20-100 MCG/ACT AERS respimat INHALE 1 PUFF BY MOUTH INTO THE LUNGS FOUR TIMES A DAY (Patient taking differently: Inhale 2 puffs into the lungs 4 (four) times daily. )  . diazepam (VALIUM) 5 MG tablet Take 5 mg by mouth 2 (two) times daily as needed for anxiety.  .Marland KitchenFLUoxetine (PROZAC) 10 MG capsule   . furosemide (LASIX) 40 MG tablet Take 1 tablet (40 mg total) by mouth daily.  .Marland Kitchengabapentin (NEURONTIN) 300 MG capsule TAKE 1 CAPSULE BY MOUTH 3 TIMES DAILY  . ipratropium-albuterol (DUONEB) 0.5-2.5 (3) MG/3ML SOLN INHALE THE CONTENTS OF 1 VIAL VIA NEBULIZER EVERY 6 HOURS AS DIRECTED FOR SHORTNESS OF BREATH OR WHEEZING (Patient taking differently: Inhale 3 mLs into the lungs 4 (four) times daily. INHALE THE CONTENTS OF 1 VIAL VIA NEBULIZER EVERY 6 HOURS AS DIRECTED FOR SHORTNESS OF BREATH OR WHEEZING)  . JANUMET 50-500 MG tablet TAKE 1 TABLET BY MOUTH ONCE A DAY  . metoprolol succinate (TOPROL-XL) 25 MG 24 hr tablet Take 1 tablet (25 mg total) by mouth daily.  . mometasone-formoterol (DULERA) 200-5 MCG/ACT AERO Inhale 2 puffs into the lungs 2 (two) times daily.  .Marland KitchenNARCAN 4 MG/0.1ML LIQD nasal spray kit   . omeprazole (PRILOSEC) 40 MG capsule TAKE 1 CAPSULE BY MOUTH EVERY DAY  . oxyCODONE-acetaminophen (PERCOCET) 10-325 MG tablet Take 1 tablet by mouth every 4 (four) hours.  . rosuvastatin (CRESTOR) 20 MG tablet TAKE 1/2 TABLET (10 MG TOTAL) BY MOUTH DAILY. (Patient taking differently: Take 10 mg by mouth daily. )  . sacubitril-valsartan (ENTRESTO) 24-26 MG Take 1 tablet by mouth 2 (two) times daily.  . VENTOLIN HFA 108 (90 Base) MCG/ACT inhaler INHALE 1-2 PUFFS EVERY 6  HOURS AS NEEDED  . Vitamin D, Ergocalciferol, (DRISDOL) 1.25 MG (50000  UT) CAPS capsule TAKE ONE CAPSULE BY MOUTH ONCE WEEKLY  . [DISCONTINUED] ADVAIR DISKUS 250-50 MCG/DOSE AEPB INHALE 1 PUFF INTO THE LUNGS TWICE DAILY  . nicotine (NICODERM CQ) 14 mg/24hr patch Place 1 patch (14 mg total) onto the skin daily.  . nicotine polacrilex (COMMIT) 4 MG lozenge Take 1 lozenge (4 mg total) by mouth as needed for smoking cessation.   No facility-administered encounter  medications on file as of 10/28/2018.      Review of Systems  Review of Systems  Constitutional: Positive for fatigue. Negative for activity change, chills, fever and unexpected weight change.  HENT: Negative for congestion, postnasal drip, rhinorrhea, sinus pressure, sinus pain and sore throat.   Eyes: Negative.   Respiratory: Positive for cough and shortness of breath. Negative for wheezing.   Cardiovascular: Negative for chest pain, palpitations and leg swelling.  Gastrointestinal: Negative for constipation, diarrhea, nausea and vomiting.  Endocrine: Negative.   Genitourinary: Negative.   Musculoskeletal: Negative.   Skin: Negative.   Neurological: Negative for dizziness and headaches.  Psychiatric/Behavioral: Negative.  Negative for dysphoric mood. The patient is not nervous/anxious.   All other systems reviewed and are negative.    Physical Exam  BP 124/84   Pulse 99   Temp (!) 97.4 F (36.3 C) (Temporal)   Ht _0  (1.727 m)   Wt 220 lb 12.8 oz (100.2 kg)   SpO2 94%   BMI 33.57 kg/m   Wt Readings from Last 5 Encounters:  10/28/18 220 lb 12.8 oz (100.2 kg)  10/24/18 214 lb 11.2 oz (97.4 kg)  09/23/18 219 lb (99.3 kg)  07/10/18 218 lb 0.6 oz (98.9 kg)  05/26/18 218 lb 1.6 oz (98.9 kg)    BMI Readings from Last 5 Encounters:  10/28/18 33.57 kg/m  10/24/18 32.65 kg/m  09/23/18 33.30 kg/m  07/10/18 32.67 kg/m  05/26/18 32.68 kg/m     Physical Exam Vitals signs and nursing note reviewed.  Constitutional:      General: He is not in acute distress.    Appearance: Normal  appearance. He is obese.  HENT:     Head: Normocephalic and atraumatic.     Right Ear: Hearing, tympanic membrane, ear canal and external ear normal.     Left Ear: Hearing, tympanic membrane, ear canal and external ear normal.     Nose: Nose normal. No mucosal edema, congestion or rhinorrhea.     Right Turbinates: Not enlarged.     Left Turbinates: Not enlarged.     Mouth/Throat:     Mouth: Mucous membranes are dry.     Dentition: Abnormal dentition.     Pharynx: Oropharynx is clear. No oropharyngeal exudate.  Eyes:     Pupils: Pupils are equal, round, and reactive to light.  Neck:     Musculoskeletal: Normal range of motion.  Cardiovascular:     Rate and Rhythm: Normal rate and regular rhythm.     Pulses: Normal pulses.     Heart sounds: Normal heart sounds. No murmur.  Pulmonary:     Effort: Pulmonary effort is normal.     Breath sounds: No decreased breath sounds, wheezing or rales.  Abdominal:     General: Bowel sounds are normal. There is no distension.     Palpations: Abdomen is soft.     Tenderness: There is no abdominal tenderness.  Musculoskeletal:     Right lower leg: No edema.     Left lower leg: No edema.  Lymphadenopathy:     Cervical: No cervical adenopathy.  Skin:    General: Skin is warm and dry.     Capillary Refill: Capillary refill takes less than 2 seconds.     Findings: No erythema or rash.  Neurological:     General: No focal deficit present.     Mental Status: He is alert and oriented to person, place, and time.     Motor: No weakness.  Coordination: Coordination normal.     Gait: Gait normal.  Psychiatric:        Mood and Affect: Mood normal.        Behavior: Behavior normal. Behavior is cooperative.        Thought Content: Thought content normal.        Judgment: Judgment normal.       Assessment & Plan:   Acute on chronic systolic CHF (congestive heart failure), NYHA class 3 (HCC) Plan: Continue furosemide Continue Entresto  Continue to weigh yourself daily Blood pressure is stable today I will route my note to cardiology to keep them updated Please notify cardiology if you start retaining fluid or weights are increasing Follow low-sodium diet Keep scheduled office visit November/2020  Non-ischemic cardiomyopathy ([place]) Plan: Continue furosemide Continue Entresto Continue to weigh yourself daily Blood pressure is stable today I will route my note to cardiology to keep them updated Please notify cardiology if you start retaining fluid or weights are increasing Follow low-sodium diet Keep scheduled office visit November/2020  Nocturnal hypoxia Plan: Continue oxygen therapy at night Walk today in office did not show any oxygen desaturations with exertion  COPD (chronic obstructive pulmonary disease) Plan: Continue Advair Continue Combivent You must stop smoking! [facility] for assistance with COPD management telephonically Referral to clinical pharmacy team here to help with stopping smoking as well as medication review Follow-up with Dr. Lamonte Sakai in 2 months  Tobacco use disorder Plan: Nicotine patch 14 mg prescribed today Nicotine lozenge 4 mg prescribed today Referral to lung cancer screening clinic, will need shared decision-making in May/2021 You must stop smoking [facility]erral to [facility] Referral to clinical pharmacy team     Return in about 2 months (around 12/28/2018), or if symptoms worsen or fail to improve, for Follow up with Dr. Lamonte Sakai.   Lauraine Rinne, NP 10/28/2018   This appointment was 42 minutes long with over 50% of the time in direct face-to-face patient care, assessment, plan of care, and follow-up.   Addendum: 11/26/2018 Patient completed appointment with clinical pharmacy team on 11/25/2018.  See excerpt listed below from [place] resident who met with the patient:  As for NRT -- [facility]'t  stock brand covered by Medicaid. Called Medicaid to determine specific brand. Got brand and NDC. [facility] (patient's mail pharmacy). They do not stock it currently but said they are able to order it. Sent new script to [facility] with [facility]. Pharmacy closed at 6 but I am planning on seeing if Rachael can follow up on this tomorrow (if not I will).   Patient should now have access to nicotine replacement therapy based off of this intervention.

## 2018-10-28 NOTE — Patient Instructions (Addendum)
You were seen today by Coral Ceo, NP  for:   1. Chronic obstructive pulmonary disease, unspecified COPD type (HCC)  Walk today   Continue Advair 250 Rinse mouth out after use  Continue Combivent as needed  Note your daily symptoms > remember "red flags" for COPD:   >>>Increase in cough >>>increase in sputum production >>>increase in shortness of breath or activity  intolerance.   If you notice these symptoms, please call the office to be seen.   We strongly suggest that you stop smoking  2. Tobacco use disorder  We recommend that you stop smoking.  >>>You need to set a quit date >>>If you have friends or family who smoke, let them know you are trying to quit and not to smoke around you or in your living environment  Smoking Cessation Resources:  1 800 QUIT NOW  >>> Patient to call this resource and utilize it to help support her quit smoking >>> Keep up your hard work with stopping smoking  You can also contact the [facility] >>>For smoking cessation classes call [phone]  We do not recommend using e-cigarettes as a form of stopping smoking  You can sign up for smoking cessation support texts and information:  >>>https://smokefree.gov/smokefreetxt  Nicotine patches: >>>Make sure you rotate sites that you do not get skin irritation, Apply 1 patch each morning to a non-hairy skin site  If you are smoking less than 10 cigarettes a day or weight less than 45 kg start with medium dose pack of 14 mg a day for 6 weeks, followed by 7 mg a day for 2 weeks   >>>If insomnia occurs you are having trouble sleeping you can take the patch off at night, and place a new one on in the morning >>>If the patch is removed at night and you have morning cravings start short acting nicotine replacement therapy such as gum or lozenges   >>>Avoid acidic beverages such as coffee, carbonated beverages before and during gum use.  A soft acidic beverages lower oral pH which  cause nicotine to not be absorbed properly >>>If you chew the gum too quickly or vigorously you could have nausea, vomiting, abdominal pain, constipation, hiccups, headache, sore jaw, mouth irritation ulcers  Nicotine lozenge: Lozenges are commonly uses short acting NRT product  >>>Smokers who smoke within 30 minutes of awakening should use 4 mg dose  Can use up to 1 lozenge every 1-2 hours for 6 weeks >>>Total amount of lozenges that can be used per day as 20 >>>Gradually reduce number of lozenges used per day after 2 weeks of use  Place lozenge in mouth and allowed to dissolve for 30 minutes loss and does not need to be chewed  Lozenges have advantages to be able to be used in people with TMG, poor dentition, dentures    We will refer you today to our lung cancer screening program >>>This is based off of your 41 pack-year smoking history >>> This is a recommendation from the [place] preventative services task force (USPSTF) >>>The USPSTF recommends annual screening for lung cancer with low-dose computed tomography (LDCT) in adults aged 4 to 80 years who have a 30 pack-year smoking history and currently smoke or have quit within the past 15 years. Screening should be discontinued once a person has not smoked for 15 years or develops a health problem that substantially limits life expectancy or the ability or willingness to have curative lung surgery.   Our office will call  you and set up an appointment NEXT May/ 2021 with Kandice RobinsonsSarah Groce (Nurse Practitioner) who leads this program.  This appointment takes place in our office.  After completing this meeting with Kandice RobinsonsSarah Groce NP you will get a low-dose CT as the screening >>>We will call you with those results  Referral to Clinical Pharmacist for Smoking cessation   [facility] HF [facility]   3. Hypoxia  Continue oxygen therapy as prescribed  >>>maintain oxygen saturations greater than 88 percent  >>>if unable to maintain  oxygen saturations please contact the office  >>>do not smoke with oxygen  >>>can use nasal saline gel or nasal saline rinses to moisturize nose if oxygen causes dryness   4. Acute on chronic systolic CHF (congestive heart failure), NYHA class 3 (HCC)  Continue to monitor your weights every day Notify cardiology if you are having a weight increase Notify cardiology if you are starting to show lower extremity swelling that is worsened  Patient Education for Congestive Heart Failure  Do the following things EVERY DAY:   1. Weigh yourself EVERY morning after you go to the bathroom but before you eat or drink anything. Write this number down in a weight log/diary.    2. Take your medicines as prescribed. If you have concerns about your medications, please call us before you stop taking them.    3. Eat low salt foods--Limit salt (sodium) to 2000 mg per day. This will help prevent your body from holding onto fluid. Read food labels as many processed foods have a lot of sodium, especially canned goods and prepackaged meats. If you would like some assistance choosing low sodium foods, we would be happy to set you up with a nutritionist.   4. Stay as active as you can everyday. Staying active will give you more energy and make your muscles stronger. Start with 5 minutes at a time and work your way up to 30 minutes a day. Break up your activities--do some in the morning and some in the afternoon. Start with 3 days per week and work your way up to 5 days as you can.  If you have chest pain, feel short of breath, dizzy, or lightheaded, STOP. If you don't feel better after a short rest, call 911. If you do feel better, call the office to let us know you have symptoms with exercise.   5. Limit all fluids for the day to less than 2 liters. Fluid includes all drinks, coffee, juice, ice chips, soup, jello, and all other liquids.   5. Non-ischemic cardiomyopathy (HCC)  Keep follow-up with cardiology in  November/2020 Continue to monitor your weights Notify cardiology if you are retaining fluid, your weights are increased   Follow Up:    Return in about 2 months (around 12/28/2018), or if symptoms worsen or fail to improve, for Follow up with Dr. Delton CoombesByrum.   Please do your part to reduce the spread of COVID-19:      Reduce your risk of any infection  and COVID19 by using the similar precautions used for avoiding the common cold or flu:   Wash your hands often with soap and warm water for at least 20 seconds.  If soap and water are not readily available, use an alcohol-based hand sanitizer with at least 60% alcohol.   If coughing or sneezing, cover your mouth and nose by coughing or sneezing into the elbow areas of your shirt or coat, into a tissue or into your sleeve (  not your hands).  WEAR A MASK when in public   Avoid shaking hands with others and consider head nods or verbal greetings only.  Avoid touching your eyes, nose, or mouth with unwashed hands.   Avoid close contact with people who are sick.  Avoid places or events with large numbers of people in one location, like concerts or sporting events.  If you have some symptoms but not all symptoms, continue to monitor at home and seek medical attention if your symptoms worsen.  If you are having a medical emergency, call 911.   [place] / e-Visit: [email or web address]         [facility]: [facility]: [phone]                   [facility]: [phone]     It is flu season:   >>> Best ways to protect herself from the flu: Receive the yearly flu vaccine, practice good hand hygiene washing with soap and also using hand sanitizer when available, eat a nutritious meals, get adequate rest, hydrate appropriately   Please contact the office if your symptoms worsen or  you have concerns that you are not improving.   Thank you for choosing [facility] for your healthcare, and for allowing [place] to partner with you on your healthcare journey. I am thankful to be able to provide care to you today.   Wyn Quaker FNP-C     Health Risks of Smoking Smoking cigarettes is very bad for your health. Tobacco smoke has over 200 known poisons in it. It contains the poisonous gases nitrogen oxide and carbon monoxide. There are over 60 chemicals in tobacco smoke that cause cancer. Smoking is difficult to quit because a chemical in tobacco, called nicotine, causes addiction or dependence. When you smoke and inhale, nicotine is absorbed rapidly into the bloodstream through your lungs. Both inhaled and non-inhaled nicotine may be addictive. What are the risks of cigarette smoke? Cigarette smokers have an increased risk of many serious medical problems, including:  Lung cancer.  Lung disease, such as pneumonia, bronchitis, and emphysema.  Chest pain (angina) and heart attack because the heart is not getting enough oxygen.  Heart disease and peripheral blood vessel disease.  High blood pressure (hypertension).  Stroke.  Oral cancer, including cancer of the lip, mouth, or voice box.  Bladder cancer.  Pancreatic cancer.  Cervical cancer.  Pregnancy complications, including premature birth.  Stillbirths and smaller newborn babies, birth defects, and genetic damage to sperm.  Early menopause.  Lower estrogen level for women.  Infertility.  Facial wrinkles.  Blindness.  Increased risk of broken bones (fractures).  Senile dementia.  Stomach ulcers and internal bleeding.  Delayed wound healing and increased risk of complications during surgery.  Even smoking lightly shortens your life expectancy by several years. Because of secondhand smoke exposure, children of smokers have an increased risk of the following:  Sudden infant death syndrome  (SIDS).  Respiratory infections.  Lung cancer.  Heart disease.  Ear infections. What are the benefits of quitting? There are many health benefits of quitting smoking. Here are some of them:  Within days of quitting smoking, your risk of having a heart attack decreases, your blood flow improves, and your lung capacity improves. Blood pressure, pulse rate, and breathing patterns start returning to normal soon after quitting.  Within months, your lungs may clear up completely.  Quitting for 10 years reduces your risk of developing lung cancer and heart disease to almost that of a nonsmoker.  People who quit may see an improvement in their overall quality of life. How do I quit smoking?     Smoking is an addiction with both physical and psychological effects, and longtime habits can be hard to change. Your health care provider can recommend:  Programs and community resources, which may include group support, education, or talk therapy.  Prescription medicines to help reduce cravings.  Nicotine replacement products, such as patches, gum, and nasal sprays. Use these products only as directed. Do not replace cigarette smoking with electronic cigarettes, which are commonly called e-cigarettes. The safety of e-cigarettes is not known, and some may contain harmful chemicals.  A combination of two or more of these methods. Where to find more information  American Lung Association: www.lung.org  American Cancer Society: www.cancer.org Summary  Smoking cigarettes is very bad for your health. Cigarette smokers have an increased risk of many serious medical problems, including several cancers, heart disease, and stroke.  Smoking is an addiction with both physical and psychological effects, and longtime habits can be hard to change.  By stopping right away, you can greatly reduce the risk of medical problems for you and your family.  To help you quit smoking, your health care provider  can recommend programs, community resources, prescription medicines, and nicotine replacement products such as patches, gum, and nasal sprays. This information is not intended to replace advice given to you by your health care provider. Make sure you discuss any questions you have with your health care provider. Document Released: 02/17/2004 Document Revised: 04/12/2017 Document Reviewed: 01/14/2016 Elsevier Patient Education  2020 Elsevier Inc.     Chronic Obstructive Pulmonary Disease Chronic obstructive pulmonary disease (COPD) is a long-term (chronic) lung problem. When you have COPD, it is hard for air to get in and out of your lungs. Usually the condition gets worse over time, and your lungs will never return to normal. There are things you can do to keep yourself as healthy as possible.  Your doctor may treat your condition with: ? Medicines. ? Oxygen. ? Lung surgery.  Your doctor may also recommend: ? Rehabilitation. This includes steps to make your body work better. It may involve a team of specialists. ? Quitting smoking, if you smoke. ? Exercise and changes to your diet. ? Comfort measures (palliative care). Follow these instructions at home: Medicines  Take over-the-counter and prescription medicines only as told by your doctor.  Talk to your doctor before taking any cough or allergy medicines. You may need to avoid medicines that cause your lungs to be dry. Lifestyle  If you smoke, stop. Smoking makes the problem worse. If you need help quitting, ask your doctor.  Avoid being around things that make your breathing worse. This may include smoke, chemicals, and fumes.  Stay active, but remember to rest as well.  Learn and use tips on how to relax.  Make sure you get enough sleep. Most adults need at least 7 hours of sleep every night.  Eat healthy foods. Eat smaller meals more often. Rest before meals. Controlled breathing Learn and use tips on how to control your  breathing as told by your doctor. Try:  Breathing in (inhaling) through your nose for 1 second. Then, pucker your lips and breath out (exhale) through your lips for 2 seconds.  Putting one hand on your belly (abdomen). Breathe in slowly through your  nose for 1 second. Your hand on your belly should move out. Pucker your lips and breathe out slowly through your lips. Your hand on your belly should move in as you breathe out.  Controlled coughing Learn and use controlled coughing to clear mucus from your lungs. Follow these steps: 1. Lean your head a little forward. 2. Breathe in deeply. 3. Try to hold your breath for 3 seconds. 4. Keep your mouth slightly open while coughing 2 times. 5. Spit any mucus out into a tissue. 6. Rest and do the steps again 1 or 2 times as needed. General instructions  Make sure you get all the shots (vaccines) that your doctor recommends. Ask your doctor about a flu shot and a pneumonia shot.  Use oxygen therapy and pulmonary rehabilitation if told by your doctor. If you need home oxygen therapy, ask your doctor if you should buy a tool to measure your oxygen level (oximeter).  Make a COPD action plan with your doctor. This helps you to know what to do if you feel worse than usual.  Manage any other conditions you have as told by your doctor.  Avoid going outside when it is very hot, cold, or humid.  Avoid people who have a sickness you can catch (contagious).  Keep all follow-up visits as told by your doctor. This is important. Contact a doctor if:  You cough up more mucus than usual.  There is a change in the color or thickness of the mucus.  It is harder to breathe than usual.  Your breathing is faster than usual.  You have trouble sleeping.  You need to use your medicines more often than usual.  You have trouble doing your normal activities such as getting dressed or walking around the house. Get help right away if:  You have shortness of  breath while resting.  You have shortness of breath that stops you from: ? Being able to talk. ? Doing normal activities.  Your chest hurts for longer than 5 minutes.  Your skin color is more blue than usual.  Your pulse oximeter shows that you have low oxygen for longer than 5 minutes.  You have a fever.  You feel too tired to breathe normally. Summary  Chronic obstructive pulmonary disease (COPD) is a long-term lung problem.  The way your lungs work will never return to normal. Usually the condition gets worse over time. There are things you can do to keep yourself as healthy as possible.  Take over-the-counter and prescription medicines only as told by your doctor.  If you smoke, stop. Smoking makes the problem worse. This information is not intended to replace advice given to you by your health care provider. Make sure you discuss any questions you have with your health care provider. Document Released: 06/28/2007 Document Revised: 12/22/2016 Document Reviewed: 02/14/2016 Elsevier Patient Education  2020 Elsevier Inc.    Low-Sodium Eating Plan Sodium, which is an element that makes up salt, helps you maintain a healthy balance of fluids in your body. Too much sodium can increase your blood pressure and cause fluid and waste to be held in your body. Your health care provider or dietitian may recommend following this plan if you have high blood pressure (hypertension), kidney disease, liver disease, or heart failure. Eating less sodium can help lower your blood pressure, reduce swelling, and protect your heart, liver, and kidneys. What are tips for following this plan? General guidelines  Most people on this plan should limit their  sodium intake to 1,500-2,000 mg (milligrams) of sodium each day. Reading food labels   The Nutrition Facts label lists the amount of sodium in one serving of the food. If you eat more than one serving, you must multiply the listed amount of  sodium by the number of servings.  Choose foods with less than 140 mg of sodium per serving.  Avoid foods with 300 mg of sodium or more per serving. Shopping  Look for lower-sodium products, often labeled as "low-sodium" or "no salt added."  Always check the sodium content even if foods are labeled as "unsalted" or "no salt added".  Buy fresh foods. ? Avoid canned foods and premade or frozen meals. ? Avoid canned, cured, or processed meats  Buy breads that have less than 80 mg of sodium per slice. Cooking  Eat more home-cooked food and less restaurant, buffet, and fast food.  Avoid adding salt when cooking. Use salt-free seasonings or herbs instead of table salt or sea salt. Check with your health care provider or pharmacist before using salt substitutes.  Cook with plant-based oils, such as canola, sunflower, or olive oil. Meal planning  When eating at a restaurant, ask that your food be prepared with less salt or no salt, if possible.  Avoid foods that contain MSG (monosodium glutamate). MSG is sometimes added to [place] food, bouillon, and some canned foods. What foods are recommended? The items listed may not be a complete list. Talk with your dietitian about what dietary choices are best for you. Grains Low-sodium cereals, including oats, puffed wheat and rice, and shredded wheat. Low-sodium crackers. Unsalted rice. Unsalted pasta. Low-sodium bread. Whole-grain breads and whole-grain pasta. Vegetables Fresh or frozen vegetables. "No salt added" canned vegetables. "No salt added" tomato sauce and paste. Low-sodium or reduced-sodium tomato and vegetable juice. Fruits Fresh, frozen, or canned fruit. Fruit juice. Meats and other protein foods Fresh or frozen (no salt added) meat, poultry, seafood, and fish. Low-sodium canned tuna and salmon. Unsalted nuts. Dried peas, beans, and lentils without added salt. Unsalted canned beans. Eggs. Unsalted nut butters. Dairy Milk. Soy  milk. Cheese that is naturally low in sodium, such as ricotta cheese, fresh mozzarella, or Swiss cheese Low-sodium or reduced-sodium cheese. Cream cheese. Yogurt. Fats and oils Unsalted butter. Unsalted margarine with no trans fat. Vegetable oils such as canola or olive oils. Seasonings and other foods Fresh and dried herbs and spices. Salt-free seasonings. Low-sodium mustard and ketchup. Sodium-free salad dressing. Sodium-free light mayonnaise. Fresh or refrigerated horseradish. Lemon juice. Vinegar. Homemade, reduced-sodium, or low-sodium soups. Unsalted popcorn and pretzels. Low-salt or salt-free chips. What foods are not recommended? The items listed may not be a complete list. Talk with your dietitian about what dietary choices are best for you. Grains Instant hot cereals. Bread stuffing, pancake, and biscuit mixes. Croutons. Seasoned rice or pasta mixes. Noodle soup cups. Boxed or frozen macaroni and cheese. Regular salted crackers. Self-rising flour. Vegetables Sauerkraut, pickled vegetables, and relishes. Olives. [place] fries. Onion rings. Regular canned vegetables (not low-sodium or reduced-sodium). Regular canned tomato sauce and paste (not low-sodium or reduced-sodium). Regular tomato and vegetable juice (not low-sodium or reduced-sodium). Frozen vegetables in sauces. Meats and other protein foods Meat or fish that is salted, canned, smoked, spiced, or pickled. Bacon, ham, sausage, hotdogs, corned beef, chipped beef, packaged lunch meats, salt pork, jerky, pickled herring, anchovies, regular canned tuna, sardines, salted nuts. Dairy Processed cheese and cheese spreads. Cheese curds. Blue cheese. Feta cheese. String cheese. Regular cottage cheese. Buttermilk. Canned  milk. Fats and oils Salted butter. Regular margarine. Ghee. Bacon fat. Seasonings and other foods Onion salt, garlic salt, seasoned salt, table salt, and sea salt. Canned and packaged gravies. Worcestershire sauce. Tartar  sauce. Barbecue sauce. Teriyaki sauce. Soy sauce, including reduced-sodium. Steak sauce. Fish sauce. Oyster sauce. Cocktail sauce. Horseradish that you find on the shelf. Regular ketchup and mustard. Meat flavorings and tenderizers. Bouillon cubes. Hot sauce and Tabasco sauce. Premade or packaged marinades. Premade or packaged taco seasonings. Relishes. Regular salad dressings. Salsa. Potato and tortilla chips. Corn chips and puffs. Salted popcorn and pretzels. Canned or dried soups. Pizza. Frozen entrees and pot pies. Summary  Eating less sodium can help lower your blood pressure, reduce swelling, and protect your heart, liver, and kidneys.  Most people on this plan should limit their sodium intake to 1,500-2,000 mg (milligrams) of sodium each day.  Canned, boxed, and frozen foods are high in sodium. Restaurant foods, fast foods, and pizza are also very high in sodium. You also get sodium by adding salt to food.  Try to cook at home, eat more fresh fruits and vegetables, and eat less fast food, canned, processed, or prepared foods. This information is not intended to replace advice given to you by your health care provider. Make sure you discuss any questions you have with your health care provider. Document Released: 07/01/2001 Document Revised: 12/22/2016 Document Reviewed: 01/03/2016 Elsevier Patient Education  2020 [employer]

## 2018-10-28 NOTE — Assessment & Plan Note (Signed)
Plan: Continue furosemide Continue Entresto Continue to weigh yourself daily Blood pressure is stable today I will route my note to cardiology to keep them updated Please notify cardiology if you start retaining fluid or weights are increasing Follow low-sodium diet Keep scheduled office visit November/2020 

## 2018-10-28 NOTE — Assessment & Plan Note (Signed)
Plan: Continue Advair Continue Combivent You must stop smoking! Referral to [facility] for assistance with COPD management telephonically Referral to clinical pharmacy team here to help with stopping smoking as well as medication review Follow-up with Dr. Lamonte Sakai in 2 months

## 2018-10-29 ENCOUNTER — Other Ambulatory Visit: Payer: Self-pay

## 2018-10-29 NOTE — Patient Outreach (Signed)
[facility]) Care Management  10/29/2018  Scott [name] [birth year] [id number]   Telephone Screen  Referral Date: 10/28/2018 Referral Source: MD [name],NP Referral Reason: "COPD, smoking cessation" Insurance: [employer] attempt #1 to patient. . No answer. RN CM left HIPAA compliant voicemail message along with contact info.      Plan: RN CM will make outreach attempt to patient within 3-4 business days. RN CM will send unsuccessful outreach letter to patient.   [name], RN,BSN,CCM [place] [facility] Telephonic Care Management Coordinator Direct Phone: [phone] Toll Free: [phone] Fax: [phone]

## 2018-10-31 DIAGNOSIS — J449 Chronic obstructive pulmonary disease, unspecified: Secondary | ICD-10-CM | POA: Diagnosis not present

## 2018-10-31 DIAGNOSIS — K219 Gastro-esophageal reflux disease without esophagitis: Secondary | ICD-10-CM | POA: Diagnosis not present

## 2018-10-31 DIAGNOSIS — I5020 Unspecified systolic (congestive) heart failure: Secondary | ICD-10-CM | POA: Diagnosis not present

## 2018-10-31 DIAGNOSIS — F17210 Nicotine dependence, cigarettes, uncomplicated: Secondary | ICD-10-CM | POA: Diagnosis not present

## 2018-11-01 ENCOUNTER — Other Ambulatory Visit: Payer: Self-pay

## 2018-11-01 NOTE — Patient Outreach (Signed)
Scott [name] [facility]) Care Management  11/01/2018  Scott [name] 1960-02-09 [id number]   Telephone Screen  Referral Date: 10/28/2018 Referral Source: MD Scott Mack,NP Referral Reason: "COPD, smoking cessation" Insurance: [employer] attempt #2 to patient. Spoke with spouse who reported that patient was still asleep and requested a call back later.      Plan: RN CM will make outreach attempt to patient within 3-4 business days.  [name], RN,BSN,CCM [place] Management Telephonic Care Management Coordinator Direct Phone: [phone] Toll Free: [phone] Fax: [phone]

## 2018-11-01 NOTE — Patient Outreach (Signed)
[facility]) Care Management  11/01/2018  Scott [name] Sep 08, 1960 [id number]   Telephone Screen  Referral Date:10/28/2018 Referral Source:MD Office-Brian Mack,NP Referral Reason:"COPD, smoking cessation" Insurance:[employer] Medicare    Return call placed to patient per spouse's previous request. Spoke with patient who immediately began to voice that he was having some SOB and chest tightness off and on since yesterday. He voices that he had MD appt yesterday and was given Prednisone and Z-pack to begin taking for a few days. He states he has not taken meds and he was unsure if he should start or "wait until he felt worse."  RN CM discussed with pt. the importance of starting meds ASAP and and finishing meds regardless if he starts feeling better. Patient also voiced that he has oxygen in the home and uses it prn. He wanted to know if he should try using oxygen right now. RN CM instructed pt. on the he benefits of oxygen and instructed him to apply oxygen during the call. Patient did so and applied oxygen via [place] at 2L/min. RN CM reviewed med list with patient and confirmed that he is taking his inhalers as ordered and reported he took them this morning and too soon for next dose. He has not taken his neb tx for today. RN CM instructed him on the benefits of neb tx usage and advised him to take one as soon as call ends. Also, noted patient has Valium tot take prn and encouraged patient to consider taking a dose as he states he has not taken any today. Patient repots that he ordered BP machine from [place] and is awaiting for it to arrive so he an begin monitoring BP in the home. He voices he has scale in the home. He has not been weighing daily but taking diuretic daily. He voices that he can tell that he is "gaining weight." RN CM educated pt. on proper weighing technique, importance of daily weight monitoring and recording. Also, advised patient that he should alert MD if  he gains 2 or more pounds within 24 hr time frame. He voiced understanding and will begin weighing daily.Patient states that he is adhering to low salt diet. RN CM discussed referral source and reason with patient. He states that pulmonologist has signed him up for smoking cessation class and he plans to attend at the end of the month. Patient gave verbal consent for [facility] services and for RN CM to follow up with patient telephonically to provide ongoing disease mgmt /education and support. Advised patient that RN CM would follow up with him within a week to complete initial assessment given patient having some SOB and RN CM did not want to keep patient on the phone for an extended period of time. RN CM also instructed patient on s/s of worsening condition and when to seek medical attention. He voiced understanding.    Plan: RN CM will follow up with patient within a week. RN CM will send successful outreach/welcome letter to patient via mail.  Scott Montgomery, RN,BSN,CCM [place] Management Telephonic Care Management Coordinator Direct Phone: [phone] Toll Free: [phone] Fax: [phone]

## 2018-11-04 DIAGNOSIS — Z23 Encounter for immunization: Secondary | ICD-10-CM | POA: Diagnosis not present

## 2018-11-05 ENCOUNTER — Other Ambulatory Visit: Payer: Self-pay

## 2018-11-05 NOTE — Patient Outreach (Signed)
Scott [name] [facility]) Care Management  [place]  11/05/2018   Scott [name] Jun 07, 1960 [id number]  Initial Assessment  Outreach attempt to patient. Spoke with patient who is noticeably feeling better compared to last call RN CM had with patient. Patient states that his breathing is improved. He has been taking Prednisone and antibiotic as directed by MD. He is also using his nebs and inhalers as ordered with relief. Patient states that he forgot to weigh this morning. His weight is about 221 lbs. RN CM reviewed with pt. proper weighing techniques and the importance of monitoring/tracking weight and alerting MD of abnormal weight gain. Patient reports that he "feels heavy." Appetite is normal and WNL for him. He states that his wife is encouraging him to drink water as he does not like to drink water. Patient reported he is drinking orange juice,cranberry juice and Kool-Aid throughout the day. RN CM noted that patient on diabetic med(Janumet). However, patient not motioning cbgs int the home. He states he does not have a meter but he has the lancets to prick his fingers which he got about two months ago. He reports he discussed with PCP and advised MD that he did not want to have to stick himself. RNCM encouraged patient to discuss this further with MD in regards to rather or not he should be monitoring his blood sugars in the home. Smoking cessation discussed with patient. He states that he quit for a few months last year. However, due to COVID-19 and the added stressors in his life he has resumed smoking. Discussed with patient the benefits of quitting smoking to his overall health and the risk of continuing to smoke. He was ordered Nicotine patches but states they are too expensive. Patient states that he has some old expired (2014) patches in the home from previous usage and wanted to know if it was okay for him to use. RN CM strongly advised patient against using/takign expired  medications and advised to discard of them. He has lozenges but has not been consistently taking them. Encouraged patient to start taking them. He reports that MD is scheduling an appt for him to attend a smoking cessation class.    Encounter Medications:  Outpatient Encounter Medications as of 11/05/2018  Medication Sig Note  . aspirin 325 MG EC tablet Take 81 mg by mouth every 6 (six) hours as needed for pain.   Marland Kitchen azithromycin (ZITHROMAX Z-PAK) 250 MG tablet Take by mouth daily. Will finish on 11/05/2018   . COMBIVENT RESPIMAT 20-100 MCG/ACT AERS respimat INHALE 1 PUFF BY MOUTH INTO THE LUNGS FOUR TIMES A DAY (Patient taking differently: Inhale 2 puffs into the lungs 4 (four) times daily. )   . diazepam (VALIUM) 5 MG tablet Take 5 mg by mouth 2 (two) times daily as needed for anxiety.   Marland Kitchen FLUoxetine (PROZAC) 10 MG capsule    . Fluticasone-Salmeterol (ADVAIR) 250-50 MCG/DOSE AEPB Inhale 1 puff into the lungs 2 (two) times daily.   . furosemide (LASIX) 40 MG tablet Take 1 tablet (40 mg total) by mouth daily.   Marland Kitchen gabapentin (NEURONTIN) 300 MG capsule TAKE 1 CAPSULE BY MOUTH 3 TIMES DAILY   . ipratropium-albuterol (DUONEB) 0.5-2.5 (3) MG/3ML SOLN INHALE THE CONTENTS OF 1 VIAL VIA NEBULIZER EVERY 6 HOURS AS DIRECTED FOR SHORTNESS OF BREATH OR WHEEZING (Patient taking differently: Inhale 3 mLs into the lungs 4 (four) times daily. INHALE THE CONTENTS OF 1 VIAL VIA NEBULIZER EVERY 6 HOURS AS DIRECTED FOR SHORTNESS  OF BREATH OR WHEEZING)   . JANUMET 50-500 MG tablet TAKE 1 TABLET BY MOUTH ONCE A DAY   . metoprolol succinate (TOPROL-XL) 25 MG 24 hr tablet Take 1 tablet (25 mg total) by mouth daily.   Marland Kitchen NARCAN 4 MG/0.1ML LIQD nasal spray kit    . nicotine polacrilex (COMMIT) 4 MG lozenge Take 1 lozenge (4 mg total) by mouth as needed for smoking cessation.   Marland Kitchen omeprazole (PRILOSEC) 40 MG capsule TAKE 1 CAPSULE BY MOUTH EVERY DAY   . oxyCODONE-acetaminophen (PERCOCET) 10-325 MG tablet Take 1 tablet by mouth  every 4 (four) hours.   . predniSONE (DELTASONE) 10 MG tablet Take 10 mg by mouth daily with breakfast. Tapering dose-to finish on 11/06/2018   . rosuvastatin (CRESTOR) 20 MG tablet TAKE 1/2 TABLET (10 MG TOTAL) BY MOUTH DAILY. (Patient taking differently: Take 10 mg by mouth daily. )   . sacubitril-valsartan (ENTRESTO) 24-26 MG Take 1 tablet by mouth 2 (two) times daily.   . VENTOLIN HFA 108 (90 Base) MCG/ACT inhaler INHALE 1-2 PUFFS EVERY 6  HOURS AS NEEDED   . Vitamin D, Ergocalciferol, (DRISDOL) 1.25 MG (50000 UT) CAPS capsule TAKE ONE CAPSULE BY MOUTH ONCE WEEKLY 09/20/2018: On Mondays  . mometasone-formoterol (DULERA) 200-5 MCG/ACT AERO Inhale 2 puffs into the lungs 2 (two) times daily.   . nicotine (NICODERM CQ) 14 mg/24hr patch Place 1 patch (14 mg total) onto the skin daily. (Patient not taking: Reported on 11/05/2018)    No facility-administered encounter medications on file as of 11/05/2018.     Functional Status:  In your present state of health, do you have any difficulty performing the following activities: 11/05/2018 09/21/2018  Hearing? N N  Vision? N N  Difficulty concentrating or making decisions? N N  Walking or climbing stairs? N N  Comment - -  Dressing or bathing? N N  Doing errands, shopping? N N  Preparing Food and eating ? N -  Using the Toilet? N -  In the past six months, have you accidently leaked urine? N -  Do you have problems with loss of bowel control? N -  Managing your Medications? N -  Managing your Finances? N -  Housekeeping or managing your Housekeeping? N -  Some recent data might be hidden    Fall/Depression Screening: Fall Risk  11/05/2018 06/19/2018 06/05/2018  Falls in the past year? 0 0 0  Number falls in past yr: 0 0 -  Injury with Fall? 0 0 -  Comment - - -  Risk for fall due to : History of fall(s) - -  Follow up Falls evaluation completed;Education provided - -   PHQ 2/9 Scores 11/05/2018 06/19/2018 06/05/2018 05/24/2018 04/29/2018  04/12/2018 03/29/2018  PHQ - 2 Score 0 0 0 0 0 0 0  PHQ- 9 Score - - - - - 0 0    Assessment:  THN CM Care Plan Problem One     Most Recent Value  Care Plan Problem One  Knowledge deficit related to disease process and mgmt of COPD.  Role Documenting the Problem One  Care Management Telephonic [place] for Problem One  Active  [facility] Long Term Goal   Patient will have no hospitalizations and exacerbation of symptoms over the next 60 days.  THN Long Term Goal Start Date  11/01/18  Interventions for Problem One Long Term Goal  RN CM reviewed action plan with pt. RN CM assessed for any worsening /exacerbation of sxs.  THN CM Short Term Goal #1   Patient will be able to verbalize COPD action action for worsening of symptoms over the next 30 days.  THN CM Short Term Goal #1 Start Date  11/01/18  Interventions for Short Term Goal #1  RN CM provided reinforcement to pt. regarding mgmt of COPD and other chronic conditions.    THN CM Care Plan Problem Two     Most Recent Value  Care Plan Problem Two  Knowledge deficit related to disease process and mgmt of symptoms related to CHF  Role Documenting the Problem Two  Care Management Telephonic Coordinator  Care Plan for Problem Two  Active  Interventions for Problem Two Long Term Goal   RN CM asssessed recent weight log. RN CM reviewd with pt. proper weighing techniqe and when to alert MD of abnormal weight.  THN Long Term Goal  Patient will report consistently monitoring and recording his weight daily for the next 31 days,  THN Long Term Goal Start Date  11/01/18  [facility] CM Short Term Goal #1   Patient will report adherence to low salt diet at least 85% of the time over the next 30 days.  THN CM Short Term Goal #1 Start Date  11/01/18  Interventions for Short Term Goal #2   RN CM reviewed with pt. low salt diet and the health benefits.   THN CM Short Term Goal #2   Patient will report adherence to med regimen/diuretics 100% of the time over  the next 30 days.  THN CM Short Term Goal #2 Start Date  11/01/18  Interventions for Short Term Goal #2  RN CM completed med review with pt. RN CM confirmed that pt. is taking meds as ordered.    THN CM Care Plan Problem Three     Most Recent Value  Care Plan Problem Three  Knowledge deficit related to risks of smoking.  Role Documenting the Problem Three  Care Management Telephonic Coordinator  Care Plan for Problem Three  Active  THN Long Term Goal   Patient will successfully quit smoking and/or decrease daily cigarette intake over the next 60 days.  THN Long Term Goal Start Date  11/05/18  Interventions for Problem Three Long Term Goal  RN CM provided smoking cessation education to patient. RN CM discussed the risk factors of smoking.  THN CM Short Term Goal #1   Patient will report taking and adherence to smoking cessation meds at least 90% of the time over the next 30 days.  THN CM Short Term Goal #1 Start Date  11/05/18  Interventions for Short Term Goal #1  RN CM reviewed and discussed smoking cessation meds with pt. and the benefits.  THN CM Short Term Goal #2   Patient will schedule an appt for smoking cessation class over the next 30 days.  THN CM Short Term Goal #2 Start Date  11/05/18  Interventions for Short Term Goal #2  RN CM discussed with pt. the benefits of attending class and improvement in his overall health if he is able to successfully stop smoking.      Plan:  RN CM will make outreach attempt to patient within one week per patient request. RN CM will send barriers letter and route encounter to PCP.  [name], RN,BSN,CCM [place] Management Telephonic Care Management Coordinator Direct Phone: [phone] Toll Free: [phone] Fax: [phone]

## 2018-11-06 DIAGNOSIS — F329 Major depressive disorder, single episode, unspecified: Secondary | ICD-10-CM | POA: Diagnosis not present

## 2018-11-06 DIAGNOSIS — F411 Generalized anxiety disorder: Secondary | ICD-10-CM | POA: Diagnosis not present

## 2018-11-07 ENCOUNTER — Encounter: Payer: Self-pay | Admitting: Adult Health

## 2018-11-07 ENCOUNTER — Ambulatory Visit: Payer: Medicare HMO

## 2018-11-07 DIAGNOSIS — Z7189 Other specified counseling: Secondary | ICD-10-CM | POA: Diagnosis not present

## 2018-11-07 DIAGNOSIS — Z79899 Other long term (current) drug therapy: Secondary | ICD-10-CM | POA: Diagnosis not present

## 2018-11-07 DIAGNOSIS — M542 Cervicalgia: Secondary | ICD-10-CM | POA: Diagnosis not present

## 2018-11-07 DIAGNOSIS — F17210 Nicotine dependence, cigarettes, uncomplicated: Secondary | ICD-10-CM | POA: Diagnosis not present

## 2018-11-11 ENCOUNTER — Other Ambulatory Visit: Payer: Self-pay

## 2018-11-11 ENCOUNTER — Other Ambulatory Visit: Payer: Self-pay | Admitting: Emergency Medicine

## 2018-11-11 NOTE — Patient Outreach (Signed)
[place] [facility]) Care Management  11/11/2018  Scott [name] March 25, 1960 [id number]     Voicemail message received from patient requesting return call. Return call placed to patient. Spoke with patient who reported he was returning RN CM call from last week. Advised patient that RN CM left voicemail message two weeks and that we had spoken together on last week. Patient reported that he had forgotten and that he has been having issues with his voicemail on phone. He reports he missed several calls from MD offices as well. However, he has called them all back this morning. Patient states that he received BP monitor and other stuff he ordered from [place] via mail. However, he does not know how to operate device. He is currently not at home. Advised patient of ways to get device working. He will try those methods and call RN CM back if needed.   Plan: RN CM will follow up with patient within a month.    [name], RN,BSN,CCM [place] Management Telephonic Care Management Coordinator Direct Phone: [phone] Toll Free: [phone] Fax: [phone]

## 2018-11-12 ENCOUNTER — Ambulatory Visit: Payer: Self-pay

## 2018-11-12 ENCOUNTER — Ambulatory Visit: Payer: Medicare HMO

## 2018-11-13 ENCOUNTER — Telehealth: Payer: Self-pay | Admitting: Pulmonary Disease

## 2018-11-13 DIAGNOSIS — F17200 Nicotine dependence, unspecified, uncomplicated: Secondary | ICD-10-CM

## 2018-11-13 MED ORDER — NICOTINE POLACRILEX 4 MG MT GUM: 4.0000 mg | CHEWING_GUM | OROMUCOSAL | 0 refills | Status: DC | PRN

## 2018-11-13 NOTE — Telephone Encounter (Signed)
Spoke with the pt  He states that he was not able to afford the nicotine patch, but wants to try the nicotine gum  I advised will call in rx to preferred pharm  Reminded him if somehow able to obtain patch in the future to not use this along with the gum  Also reminded him of the following regarding nicotine gum from recent AVS: >>>Avoid acidic beverages such as coffee, carbonated beverages before and during gum use.  A soft acidic beverages lower oral pH which cause nicotine to not be absorbed properly >>>If you chew the gum too quickly or vigorously you could have nausea, vomiting, abdominal pain, constipation, hiccups, headache, sore jaw, mouth irritation   Pt verbalized understanding  He will keep planned appt with pharm on 11/25/18   Will forward this to [place] as [name]

## 2018-11-13 NOTE — Telephone Encounter (Signed)
Thank you. Noted. Will route to [place] as [name] as well. As patient has upcoming appt with them.   Wyn Quaker FNP

## 2018-11-18 ENCOUNTER — Telehealth: Payer: Self-pay | Admitting: Pulmonary Disease

## 2018-11-18 NOTE — Telephone Encounter (Signed)
LMTCB x1 for pt.  

## 2018-11-21 ENCOUNTER — Other Ambulatory Visit: Payer: Self-pay | Admitting: Family Medicine

## 2018-11-21 NOTE — Telephone Encounter (Signed)
I called [employer] and retrieved the number to his insurance plan. I called [phone] and initiated the PA for the nicotine gum. Reference number is [id number] and it would take 24-72 hours for a decision. To call and check on status of PA we can call [phone]. They will send [place] a fax to let us know if the gum is a[pproved.

## 2018-11-22 NOTE — Patient Instructions (Addendum)
It was a pleasure seeing you in clinic today [name]!  Today the plan is...   1. START using nicotine patch Weeks 1 to 6: Use one patch (14 mg) per day After 6 weeks: Use one patch (7 mg) per day  2. START using nicotine lozenge 2 mg. Follow schedule below.  Weeks 1 to 6: 1 lozenge every 1 to 2 hours (maximum: 5 lozenges every 6 hours; 20 lozenges/day); to increase chances of quitting, use at least 9 lozenges/day during the first 6 weeks  Weeks 7 to 9: 1 lozenge every 2 to 4 hours (maximum: 5 lozenges every 6 hours; 20 lozenges/day)  Weeks 10 to 12: 1 lozenge every 4 to 8 hours (maximum: 5 lozenges every 6 hours; 20 lozenges/day)  Please call the PharmD clinic at [phone] if you have any questions that you would like to speak with a pharmacist about Scott [name], Museum/gallery conservator).   3. I will call pharmacy to determine insurance coverage for Proventil, Nicotine patch, and Nicotine lozenge. Scott [name] will call you after to update you.  4. Start using mints instead of cigarettes when you can.  5. Emberlynn Riggan (pharmacist) will call you in 2 weeks to touch base to see how you are doing (12/09/18)  Please call the PharmD clinic at [phone] if you have any questions that you would like to speak with a pharmacist about Scott [name], Museum/gallery conservator).

## 2018-11-22 NOTE — Progress Notes (Signed)
Patient ID: Scott [name]                 DOB: Jun 03, 1960                      MRN: [id number]     Subjective:  Scott [name] is a 58 y.o. male referred by nurse practitioner, Scott [name] for smoking cessation. PMH is significant for CHF, non-ischemic cardiomyopathy, COPD, allergic rhinitis, nocturnal hypoxia, acute bronchitis, GERD, DM, cervical spondylosis with myelopathy, degeneration of lumbar intervertebral disc, HLD.  Pt presents today for initial appt for smoking cessation. Pt also admits to smoking marijuana occasionally then will smoke a cigarette 5-10 minutes later.  He does not smoke one full cigarette at a time, rather, patient will smoke 1/4 cigarette at a time then put it out. When he is ready to smoke again he will continue smoking cigarette in that manner (1/4 cigarette at a time) until he is finished. Patient states he wasn't able to purchase NRT patches/gum/lozenges from the pharmacy due to cost. He brought samples of NRT patches (expired) and gum (expires 04/2020) he has received from PCP. Patient has quit smoking three times in the past. Patient stopped smoking abruptly without use of NRT, Chantix, or bupropion. Patient is willing to quit smoking using NRT (specifically patch/lozenge). He is worried about using Chantix because he has "seen actors go crazy using Chantix". He prefers to use the lozenge over the gum for ease considering lack of teeth.   Pt also is concerned regarding albuterol inhaler. He says albuterol is "too weak", Proventil is a "little stronger", and Ventolin is the "strongest". He requests a prescription written specifically for Ventolin.  When performing medication reconciliation. Pt states he uses aspirin 325 mg six times daily. He states he uses aspirin "to help his heart" and for "pain".  Patient states if not possible to get in touch with him via his phone number, then to call [phone] (Wife's phone number Scott [name]))  Insurance coverage: [employer] Medicare and  medicaid Medicare - chantix ($100/30 day; $290/90 day); bupropion ($47/30 day; $131/90 day); NRT (not covered) Medicaid - all smoking cessation agents covered except nicotine inhaler  Social history: 7-8 cigarettes per day Brand: [place] 100s  Current smoking cessation agents: none  Prior smoking cessation agents: bupropion (does not remember) Non-pharmacologic methods: none Age when started using tobacco on a daily basis: 58 years old (59 years) Triggers: eating meals, watching "good programs" (football), edible marijuana, smoking marijuana, using the bathroom, fighting with family members, caretaker for grandchildren, wife smokes cigarettes, marital issues, multiple family members died from COVID-37 Motivation to quit: concern for his health Rates IMPORTANCE of quitting tobacco on 1-10 scale of 10 Rates CONFIDENCE of quitting tobacco on 1-10 scale of 8  Fagerstrom Score Question Scoring Patient Score  How soon after waking do you smoke your first cigarette? <5 mins (3) 5-30 mins (2) 31-60 mins (1) >60 mins (0) 1  Do you find it difficult NOT to smoke in places where you shouldn't? Yes(1) No (0) 0  Which cigarette would you most hate to give up? First one in AM (1)  Any other one (0) 0     How many cigarettes do you smoke/day? 10 or less (0) 11-20 (1) 21-30 (2) >30 (3) 0  Do you smoke more during the first few hours after waking? Yes (1) No (0) 0  Do you smoke if you are so ill you cannot get out of bed? Yes (1)  No (0) 0   Total Score   Score interpretation: low 1-2, low-to-moderate 3-4, moderate 5-7, high >7  Score: 1 (low)  Objective: Current Outpatient Medications on File Prior to Visit  Medication Sig Dispense Refill  . aspirin 325 MG EC tablet Take 81 mg by mouth every 6 (six) hours as needed for pain.    Marland Kitchen azithromycin (ZITHROMAX Z-PAK) 250 MG tablet Take by mouth daily. Will finish on 11/05/2018    . COMBIVENT RESPIMAT 20-100 MCG/ACT AERS respimat INHALE 1 PUFF  BY MOUTH INTO THE LUNGS FOUR TIMES A DAY 4 g 0  . diazepam (VALIUM) 5 MG tablet Take 5 mg by mouth 2 (two) times daily as needed for anxiety.    Marland Kitchen FLUoxetine (PROZAC) 10 MG capsule     . Fluticasone-Salmeterol (ADVAIR) 250-50 MCG/DOSE AEPB Inhale 1 puff into the lungs 2 (two) times daily.    . furosemide (LASIX) 40 MG tablet Take 1 tablet (40 mg total) by mouth daily. 90 tablet 3  . gabapentin (NEURONTIN) 300 MG capsule TAKE 1 CAPSULE BY MOUTH 3 TIMES DAILY 90 capsule 0  . ipratropium-albuterol (DUONEB) 0.5-2.5 (3) MG/3ML SOLN INHALE THE CONTENTS OF 1 VIAL VIA NEBULIZER EVERY 6 HOURS AS DIRECTED FOR SHORTNESS OF BREATH OR WHEEZING (Patient taking differently: Inhale 3 mLs into the lungs 4 (four) times daily. INHALE THE CONTENTS OF 1 VIAL VIA NEBULIZER EVERY 6 HOURS AS DIRECTED FOR SHORTNESS OF BREATH OR WHEEZING) 360 mL 5  . JANUMET 50-500 MG tablet TAKE 1 TABLET BY MOUTH ONCE A DAY 90 tablet 0  . metoprolol succinate (TOPROL-XL) 25 MG 24 hr tablet Take 1 tablet (25 mg total) by mouth daily. 90 tablet 3  . mometasone-formoterol (DULERA) 200-5 MCG/ACT AERO Inhale 2 puffs into the lungs 2 (two) times daily.    Marland Kitchen NARCAN 4 MG/0.1ML LIQD nasal spray kit     . nicotine (NICODERM CQ) 14 mg/24hr patch Place 1 patch (14 mg total) onto the skin daily. (Patient not taking: Reported on 11/05/2018) 28 patch 3  . nicotine polacrilex (COMMIT) 4 MG lozenge Take 1 lozenge (4 mg total) by mouth as needed for smoking cessation. 100 tablet 3  . nicotine polacrilex (NICORETTE) 4 MG gum Take 1 each (4 mg total) by mouth as needed for smoking cessation. 100 tablet 0  . omeprazole (PRILOSEC) 40 MG capsule TAKE 1 CAPSULE BY MOUTH EVERY DAY 30 capsule 0  . oxyCODONE-acetaminophen (PERCOCET) 10-325 MG tablet Take 1 tablet by mouth every 4 (four) hours. 15 tablet 0  . predniSONE (DELTASONE) 10 MG tablet Take 10 mg by mouth daily with breakfast. Tapering dose-to finish on 11/06/2018    . rosuvastatin (CRESTOR) 20 MG tablet TAKE  1/2 TABLET (10 MG TOTAL) BY MOUTH DAILY. (Patient taking differently: Take 10 mg by mouth daily. ) 90 tablet 0  . sacubitril-valsartan (ENTRESTO) 24-26 MG Take 1 tablet by mouth 2 (two) times daily. 180 tablet 3  . VENTOLIN HFA 108 (90 Base) MCG/ACT inhaler INHALE 1-2 PUFFS EVERY 6  HOURS AS NEEDED 18 g 2  . Vitamin D, Ergocalciferol, (DRISDOL) 1.25 MG (50000 UT) CAPS capsule TAKE ONE CAPSULE BY MOUTH ONCE WEEKLY 12 capsule 0   No current facility-administered medications on file prior to visit.     Assessment/Plan: 1. Smoking Cessation: -Initiate nicotine replacement tx with nicotine patches and nicotine lozenges.   Nicotine patch dosing (14 mg --> 7 mg) Weeks 1 to 6: Apply one patch (14 mg) daily   After 6 weeks:  Apply one patch (7 mg) daily  Nicotine lozenge dosing (2 mg) Weeks 1 to 6: 1 lozenge every 1 to 2 hours (maximum: 5 lozenges every 6 hours; 20 lozenges/day); to increase chances of quitting, use at least 9 lozenges/day during the first 6 weeks  Weeks 7 to 9: 1 lozenge every 2 to 4 hours (maximum: 5 lozenges every 6 hours; 20 lozenges/day)  Weeks 10 to 12: 1 lozenge every 4 to 8 hours (maximum: 5 lozenges every 6 hours; 20 lozenges/day)  Patient counseled on purpose, proper use, and potential adverse effects, including mild itching/redness/headache/trouble sleeping/vivid dreams with nicotine patch and nausea/mouth irritation/sore throat/trouble sleeping with nicotine lozenge. Advised pt to take patch off at night if he experiences nightmares/vivid dreams/insomnia. Pt verbalized understanding. Pt also willing to try using a mint rather than smoking a cigarette.   -Provided information on 1 800-QUIT NOW support program.   2. Albuterol inhaler: Educated patient that albuterol/Ventolin/Proventil all have the same active ingredient (albuterol sulfate). Pt is adamant about Proventil being the "strongest". Plan to start Ventolin 1-2 puffs every 6 hours as needed.  3. Excess aspirin  use: Patient is taking aspirin 325 mg six times daily (total = 1950 mg). Although this is less than the dose expected to cause toxicity (total = 150 mg/kg or 15,030 mg), it is a large dose. Patient is not on any other blood thinngers (DOACs, warfarin) and does not take NSAIDs. Patient is also on PPI (using for heartburn), however, this will provide protection against GI bleed. Recommended pt follow up with his cardiologist for clarification regarding aspirin dose. Pt verbalized understanding. Pt stated he has a f/u cardiology appt later this week and will bring it up to the cardiologist, Dr. Percival Spanish, at that time.   Thank you for involving pharmacy to assist in providing Scott [name] care.   Drexel Iha, PharmD PGY2 Ambulatory Care Pharmacy Resident

## 2018-11-25 ENCOUNTER — Telehealth: Payer: Self-pay | Admitting: Emergency Medicine

## 2018-11-25 ENCOUNTER — Telehealth: Payer: Self-pay | Admitting: Pharmacist

## 2018-11-25 ENCOUNTER — Ambulatory Visit (INDEPENDENT_AMBULATORY_CARE_PROVIDER_SITE_OTHER): Payer: Medicare HMO | Admitting: Pharmacist

## 2018-11-25 ENCOUNTER — Other Ambulatory Visit: Payer: Self-pay

## 2018-11-25 DIAGNOSIS — J449 Chronic obstructive pulmonary disease, unspecified: Secondary | ICD-10-CM

## 2018-11-25 DIAGNOSIS — Z720 Tobacco use: Secondary | ICD-10-CM | POA: Diagnosis not present

## 2018-11-25 DIAGNOSIS — F17210 Nicotine dependence, cigarettes, uncomplicated: Secondary | ICD-10-CM

## 2018-11-25 DIAGNOSIS — F17200 Nicotine dependence, unspecified, uncomplicated: Secondary | ICD-10-CM

## 2018-11-25 MED ORDER — ALBUTEROL SULFATE HFA 108 (90 BASE) MCG/ACT IN AERS: 1.0000 | INHALATION_SPRAY | Freq: Four times a day (QID) | RESPIRATORY_TRACT | 11 refills | Status: DC | PRN

## 2018-11-25 MED ORDER — NICOTINE POLACRILEX 2 MG MT LOZG: 2.0000 mg | LOZENGE | OROMUCOSAL | 0 refills | Status: DC | PRN

## 2018-11-25 MED ORDER — NICOTINE 14 MG/24HR TD PT24: 14.0000 mg | MEDICATED_PATCH | Freq: Every day | TRANSDERMAL | 0 refills | Status: DC

## 2018-11-25 MED ORDER — ALBUTEROL SULFATE HFA 108 (90 BASE) MCG/ACT IN AERS: 1.0000 | INHALATION_SPRAY | Freq: Four times a day (QID) | RESPIRATORY_TRACT | 2 refills | Status: DC | PRN

## 2018-11-25 NOTE — Assessment & Plan Note (Addendum)
-  Initiate nicotine replacement tx with nicotine patches and nicotine lozenges.   Nicotine patch dosing (14 mg --> 7 mg) Weeks 1 to 6: Apply one patch (14 mg) daily   After 6 weeks: Apply one patch (7 mg) daily  Nicotine lozenge dosing (2 mg) Weeks 1 to 6: 1 lozenge every 1 to 2 hours (maximum: 5 lozenges every 6 hours; 20 lozenges/day); to increase chances of quitting, use at least 9 lozenges/day during the first 6 weeks  Weeks 7 to 9: 1 lozenge every 2 to 4 hours (maximum: 5 lozenges every 6 hours; 20 lozenges/day)  Weeks 10 to 12: 1 lozenge every 4 to 8 hours (maximum: 5 lozenges every 6 hours; 20 lozenges/day)  Patient counseled on purpose, proper use, and potential adverse effects, including mild itching/redness/headache/trouble sleeping/vivid dreams with nicotine patch and nausea/mouth irritation/sore throat/trouble sleeping with nicotine lozenge. Advised pt to take patch off at night if he experiences nightmares/vivid dreams/insomnia. Pt verbalized understanding. Pt also willing to try using a mint rather than smoking a cigarette.  -Provided information on 1 800-QUIT NOW support program.   Will f/u with pt in 2 weeks regarding smoking cessation.

## 2018-11-25 NOTE — Addendum Note (Signed)
Addended by: Ellwood Handler on: 11/25/2018 05:08 PM   Modules accepted: Orders

## 2018-11-25 NOTE — Telephone Encounter (Signed)
Called pt on 11/25/2018.  Called pt regarding status of Ventolin, nicotine patch, and nicotine lozenge.  1. Ventolin: Ventolin was not able to be filled by [facility] because albuterol inhaler was recently filled Friday (11/22/2018) via [facility]. Patient called [facility] today requesting Ventolin. [facility] staff member stated a new prescription will have to be sent in specifically for Ventolin. Afterwards, pharmacy staff will fill prescription and ship out Ventolin. Plan to write new prescription for Ventolin.   2. Nicotine patch and lozenge: Designer, industrial/product member stated [facility] only keeps [facility] brand of nicotine patch and lozenge in stock. Called Medicaid to determine specific brand/NDC of nicotine patch/lozenge covered. Medicaid representative stated for the patches that the covered brand is [place] [facility] for 14 mg patch is [phone] ; [place] for 7 mg patch is [phone]). Medicaid representative stated for the lozenges that the covered brand is [place] [facility] / [employer] [facility] [facility] for 2 mgnicotine lozenge is [phone]). [place] and spoke with staff member who confirmed while [facility] does not have these brands in stock currently, they are able to order these brands. Sent in new prescription writing detailed information regarding NDC numbers/brands covered.   Patient verbalized understanding and appreciation for the update.   Will call [facility] tomorrow (11/26/18) to follow up regarding the status of Ventolin, nicotine patch, and nicotine lozenge prescriptions.   Drexel Iha, PharmD PGY2 Ambulatory Care Pharmacy Resident

## 2018-11-25 NOTE — Addendum Note (Signed)
Addended by: Ellwood Handler on: 11/25/2018 05:44 PM   Modules accepted: Orders

## 2018-11-26 ENCOUNTER — Telehealth: Payer: Self-pay | Admitting: Pharmacist

## 2018-11-26 MED ORDER — NICOTINE POLACRILEX 2 MG MT LOZG: 2.0000 mg | LOZENGE | OROMUCOSAL | 11 refills | Status: DC | PRN

## 2018-11-26 NOTE — Telephone Encounter (Signed)
I talked to Mr Scott [name] today.  He is taking Aspirin 325mg  three times daily to "boost" his pain management medication.  We discussed long term use problems like renal issues, and GI bleed. Patient will stop taking daily aspirin and use only 1 tablet a day IF NEEDED. He is to discuss other "booster" alteratives with his pain management clinic.  Patient will keep follow up appointment with Dr Percival Spanish on Nov/5.

## 2018-11-26 NOTE — Addendum Note (Signed)
Addended by: Ellwood Handler on: 11/26/2018 07:36 PM   Modules accepted: Orders

## 2018-11-26 NOTE — Progress Notes (Signed)
Thanks

## 2018-11-26 NOTE — Telephone Encounter (Signed)
[facility], Patient should receive shipment of Ventolin and Nicotine Patches today. Rep did not see prescription for Nicotine Lozenge on file. We may need to send again.  Phone# [phone]  Beatriz Chancellor, CPhT

## 2018-11-27 DIAGNOSIS — I10 Essential (primary) hypertension: Secondary | ICD-10-CM | POA: Insufficient documentation

## 2018-11-27 NOTE — Telephone Encounter (Signed)
[facility] to follow up on Nicotine 2mg  Lozenge prescription. Rep Janett Billow stated that they have received it but they states the [facility] provided was not valid. Also advised that patient's Medicare D plan will not cover.   Ran test claim for alternate [facility] - [phone], patient's Med D plan did not cover, but patient's Medicaid plan did cover with a $3 copay.  Called [facility] back,  gave alternate [facility] and advised that Med D will not cover but Medicaid will. Also confirmed that they do have patient's medicaid info on file. Rep Janett Billow will give updated information to their billing department. Will follow up.  Phone# [phone]  9:38 AM Beatriz Chancellor, CPhT

## 2018-11-27 NOTE — Progress Notes (Signed)
Cardiology Office Note   Date:  11/28/2018   ID:  Maksymilian Mabey, DOB April 23, 1960, MRN [id number]  PCP:  Fredrich Romans, [place]  Cardiologist:   Minus Breeding, MD   Chief Complaint  Patient presents with  . Palpitations      History of Present Illness: Scott [name] is a 58 y.o. male who presents for chronic systolic heart failure, nonischemic cardiomyopathy, status post hospitalization for acute respiratory failure with hypoxemia.  Right and left cardiac catheterization which revealed no angiographic evidence of CAD., with right heart cath revealing volume overload.  EF was 30% to 35%.     Since we last saw him remotely he feels his heart racing.  He feels like he might have some shortness of breath if he is lying flat and he is not describing PND or orthopnea.  He has had no edema.  His weights have been relatively stable although they seem to bounce around.  He seems to be watching some of his salt.  He hears a little wheezing but he still smoking.  He has not been having any new chest pressure, neck or arm discomfort.  He has tolerated the increased medications with Entresto.  He has had no presyncope or syncope.  He wears his oxygen at night.   Past Medical History:  Diagnosis Date  . 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 COPD (chronic obstructive pulmonary disease) ([place])   . Diabetes mellitus without complication ([place])   . GERD (gastroesophageal reflux disease)   . Headache(784.0)   . Pneumonia    hx    Past Surgical History:  Procedure Laterality Date  . ANTERIOR CERVICAL DECOMP/DISCECTOMY FUSION  12/22/2010   Procedure: ANTERIOR CERVICAL DECOMPRESSION/DISCECTOMY FUSION 3 LEVELS;  Surgeon: Ophelia Charter;  Location: [facility];  Service: Neurosurgery;  Laterality: N/A;  Anterior cervical discectomy with fusion cervical three-four, four-five, and five sixCDF with Interbody Prosthesis, plating, and Bone Graft   . ANTERIOR CERVICAL DECOMP/DISCECTOMY  FUSION N/A 10/16/2012   Procedure: CERVICAL SIX-SEVEN ANTERIOR CERVICAL DECOMPRESSION/DISCECTOMY FUSION WITH INTERBODY PROTHESIS PLATING BONEGRAFT WITH /POSSIBLE HARDWARE REMOVAL OLD PLATE;  Surgeon: Ophelia Charter, MD;  Location: [facility];  Service: Neurosurgery;  Laterality: N/A;  . MULTIPLE TOOTH EXTRACTIONS    . OTHER SURGICAL HISTORY     surgery on cheekbone and head for fall 2000  . 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 RIGHT/LEFT HEART CATH AND CORONARY ANGIOGRAPHY N/A 09/23/2018   Procedure: RIGHT/LEFT HEART CATH AND CORONARY ANGIOGRAPHY and possible PCI/stent;  Surgeon: MBurnell Blanks MD;  [facility];  Service: Cardiovascular;  Laterality: N/A;  . SPINE SURGERY     2014 and 2016 - Dr JArnoldo Morale . TONSILLECTOMY       Current Outpatient Medications  Medication Sig Dispense Refill  . albuterol (VENTOLIN HFA) 108 (90 Base) MCG/ACT inhaler Inhale 1-2 puffs into the lungs every 6 (six) hours as needed for wheezing or shortness of breath. 18 g 11  . aspirin 325 MG EC tablet Take 81 mg by mouth daily.    . COMBIVENT RESPIMAT 20-100 MCG/ACT AERS respimat INHALE 1 PUFF BY MOUTH INTO THE LUNGS FOUR TIMES A DAY 4 g 0  . diazepam (VALIUM) 5 MG tablet Take 5 mg by mouth 2 (two) times daily as  needed for anxiety.    Marland Kitchen FLUoxetine (PROZAC) 20 MG capsule Take 20 mg by mouth daily.     . Fluticasone-Salmeterol (ADVAIR) 250-50 MCG/DOSE AEPB Inhale 1 puff into the lungs 2 (two) times daily.    . furosemide (LASIX) 40 MG tablet Take 1 tablet (40 mg total) by mouth daily. 90 tablet 3  . gabapentin (NEURONTIN) 300 MG capsule TAKE 1 CAPSULE BY MOUTH 3 TIMES DAILY 90 capsule 0  . ipratropium-albuterol (DUONEB) 0.5-2.5 (3) MG/3ML SOLN INHALE THE CONTENTS OF 1 VIAL VIA NEBULIZER EVERY 6 HOURS AS DIRECTED FOR SHORTNESS OF BREATH OR WHEEZING (Patient taking differently: Inhale 3  mLs into the lungs 4 (four) times daily. INHALE THE CONTENTS OF 1 VIAL VIA NEBULIZER EVERY 6 HOURS AS DIRECTED FOR SHORTNESS OF BREATH OR WHEEZING) 360 mL 5  . JANUMET 50-500 MG tablet TAKE 1 TABLET BY MOUTH ONCE A DAY 90 tablet 0  . NARCAN 4 MG/0.1ML LIQD nasal spray kit     . nicotine (NICODERM CQ - DOSED IN MG/24 HOURS) 14 mg/24hr patch Place 1 patch (14 mg total) onto the skin daily for 28 days. For weeks 1-4. 28 patch 0  . nicotine polacrilex (NICOTINE MINI) 2 MG lozenge Take 1 lozenge (2 mg total) by mouth as needed for smoking cessation. (maximum: 5 lozenges every 6 hours; 20 lozenges/day). 288 tablet 11  . omeprazole (PRILOSEC) 40 MG capsule TAKE 1 CAPSULE BY MOUTH EVERY DAY 30 capsule 0  . oxyCODONE-acetaminophen (PERCOCET) 10-325 MG tablet Take 1 tablet by mouth every 4 (four) hours. 15 tablet 0  . rosuvastatin (CRESTOR) 10 MG tablet Take 10 mg by mouth daily.    . sacubitril-valsartan (ENTRESTO) 24-26 MG Take 1 tablet by mouth 2 (two) times daily. 180 tablet 3  . Vitamin D, Ergocalciferol, (DRISDOL) 1.25 MG (50000 UT) CAPS capsule TAKE ONE CAPSULE BY MOUTH ONCE WEEKLY 12 capsule 0  . bisoprolol (ZEBETA) 5 MG tablet Take 1 tablet (5 mg total) by mouth daily. 90 tablet 1   No current facility-administered medications for this visit.     Allergies:   Penicillins and Cymbalta [duloxetine hcl]    ROS:  Please see the history of present illness.   Otherwise, review of systems are positive for none.   All other systems are reviewed and negative.    PHYSICAL EXAM: VS:  BP 107/72   Pulse 61   Temp (!) 97 F (36.1 C)   Ht '5\' 8"'  (1.727 m)   Wt 217 lb 6.4 oz (98.6 kg)   SpO2 91%   BMI 33.06 kg/m  , BMI Body mass index is 33.06 kg/m. GENERAL:  Well appearing NECK:  No jugular venous distention, waveform within normal limits, carotid upstroke brisk and symmetric, no bruits, no thyromegaly LUNGS:  Clear to auscultation bilaterally CHEST:  Mild expiratory wheezes.  HEART:  PMI not  displaced or sustained,S1 and S2 within normal limits, no S3, no S4, no clicks, no rubs, no murmurs ABD:  Flat, positive bowel sounds normal in frequency in pitch, no bruits, no rebound, no guarding, no midline pulsatile mass, no hepatomegaly, no splenomegaly EXT:  2 plus pulses throughout, no edema, no cyanosis no clubbing    EKG:  EKG is ordered today. The ekg ordered today demonstrates sinus tachycardia, rate 118, left axis deviation, possible old inferior infarct, lateral T wave inversions slightly more prominent than previous..   Recent Labs: 03/01/2018: TSH 1.110 04/05/2018: Magnesium 2.0 09/20/2018: ALT 23; B Natriuretic Peptide 1,680.8 09/24/2018: BUN 29;  Creatinine, Ser 1.17; Hemoglobin 15.0; Platelets 303; Potassium 3.8; Sodium 138    Lipid Panel    Component Value Date/Time   CHOL 161 03/01/2018 0958   TRIG 140 05/26/2018 1209   HDL 40 03/01/2018 0958   CHOLHDL 4.0 03/01/2018 0958   LDLCALC 89 03/01/2018 0958      Wt Readings from Last 3 Encounters:  11/28/18 217 lb 6.4 oz (98.6 kg)  10/28/18 220 lb 12.8 oz (100.2 kg)  10/24/18 214 lb 11.2 oz (97.4 kg)      Other studies Reviewed: Additional studies/ records that were reviewed today include: None. Review of the above records demonstrates:  Please see elsewhere in the note.     ASSESSMENT AND PLAN:  Nonischemic cardiomyopathy:     We had a long discussion about this.  We talked about weighing himself at the same time every day.  I reinforced previous education.  We talked about salt and fluid restriction.  I would like to try to titrate his beta-blocker but switch him to bisoprolol as it is more beta selective given his ongoing wheezing.  I think he is in need to be seen frequently to titrate meds as possible.  He might eventually need advanced therapies.    Hypertension:   This is being managed in the context of treating his CHF   COPD:    We talked about the need to stop smoking.  He has nicotine patches  now.  Current medicines are reviewed at length with the patient today.  The patient does not have concerns regarding medicines.  The following changes have been made:  As above.   Labs/ tests ordered today include:   Orders Placed This Encounter  Procedures  . Basic metabolic panel  . EKG 12-Lead     Disposition:   FU with Jory Sims DNP in two weeks.     Signed, Minus Breeding, MD  11/28/2018 9:51 AM    [place]

## 2018-11-28 ENCOUNTER — Encounter: Payer: Self-pay | Admitting: Cardiology

## 2018-11-28 ENCOUNTER — Other Ambulatory Visit: Payer: Self-pay

## 2018-11-28 ENCOUNTER — Ambulatory Visit (INDEPENDENT_AMBULATORY_CARE_PROVIDER_SITE_OTHER): Payer: Medicare HMO | Admitting: Cardiology

## 2018-11-28 VITALS — BP 107/72 | HR 61 | Temp 97.0°F | Ht 68.0 in | Wt 217.4 lb

## 2018-11-28 DIAGNOSIS — J449 Chronic obstructive pulmonary disease, unspecified: Secondary | ICD-10-CM | POA: Diagnosis not present

## 2018-11-28 DIAGNOSIS — Z79899 Other long term (current) drug therapy: Secondary | ICD-10-CM

## 2018-11-28 DIAGNOSIS — I10 Essential (primary) hypertension: Secondary | ICD-10-CM | POA: Diagnosis not present

## 2018-11-28 DIAGNOSIS — I5022 Chronic systolic (congestive) heart failure: Secondary | ICD-10-CM

## 2018-11-28 MED ORDER — BISOPROLOL FUMARATE 5 MG PO TABS: 5.0000 mg | ORAL_TABLET | Freq: Every day | ORAL | 1 refills | Status: DC

## 2018-11-28 NOTE — Patient Instructions (Signed)
Medication Instructions:   STOP METOPROLOL SUCCINATE (TOPROL XL)   START BISOPROLOL (ZEBETA) 5 MG DAILY  DECREASE ASPIRIN TO 81 MG DAILY *If you need a refill on your cardiac medications before your next appointment, please call your pharmacy*  Lab Work:  YOU WILL NEED TO HAVE LABS (BLOOD WORK) DRAWN:  BMET If you have labs (blood work) drawn today and your tests are completely normal, you will receive your results only by: Marland Kitchen MyChart Message (if you have MyChart) OR . A paper copy in the mail If you have any lab test that is abnormal or we need to change your treatment, we will call you to review the results.  Testing/Procedures: NONE ordered at this time of appointment   Follow-Up: At [facility], you and your health needs are our priority.  As part of our continuing mission to provide you with exceptional heart care, we have created designated Provider Care Teams.  These Care Teams include your primary Cardiologist (physician) and Advanced Practice Providers (APPs -  Physician Assistants and Nurse Practitioners) who all work together to provide you with the care you need, when you need it.  Your next appointment:   2 WEEKS   The format for your next appointment:   In Person  Provider:   Jory Sims, DNP, ANP  Other Instructions  CONTINUE TO [place]

## 2018-11-28 NOTE — Telephone Encounter (Signed)
[facility], they were able to process the Nicotine Lozenges. They are in the process to reach out to patient to schedule shipment.  Phone# [phone]  9:50 AM Beatriz Chancellor, CPhT

## 2018-11-29 LAB — SPECIMEN STATUS REPORT

## 2018-12-02 ENCOUNTER — Ambulatory Visit: Payer: Self-pay

## 2018-12-02 LAB — BASIC METABOLIC PANEL
BUN/Creatinine Ratio: 13 (ref 9–20)
BUN: 17 mg/dL (ref 6–24)
CO2: 25 mmol/L (ref 20–29)
Calcium: 9.7 mg/dL (ref 8.7–10.2)
Chloride: 107 mmol/L — ABNORMAL HIGH (ref 96–106)
Creatinine, Ser: 1.36 mg/dL — ABNORMAL HIGH (ref 0.76–1.27)
GFR calc Af Amer: 66 mL/min/{1.73_m2} (ref >59)
GFR calc non Af Amer: 57 mL/min/{1.73_m2} — ABNORMAL LOW (ref >59)
Glucose: 115 mg/dL — ABNORMAL HIGH (ref 65–99)
Potassium: 4.5 mmol/L (ref 3.5–5.2)
Sodium: 149 mmol/L — ABNORMAL HIGH (ref 134–144)

## 2018-12-03 ENCOUNTER — Other Ambulatory Visit: Payer: Self-pay

## 2018-12-03 NOTE — Patient Outreach (Signed)
[facility]) Care Management  12/03/2018  Scott [name] July 07, 1960 [id number]   Telephone Assessment    Outreach attempt #1 to patient. Spoke briefly with patient as he voiced he was sitting in pharmacy drive thru line picking up his meds. Patient states he went to see cardiologist recently and had a "great appt." He voices that MD told him to continues doing the things that RN CM had educated patient on (weighing daily and recording, limiting sodium intake). Patient also shares that Md told him to try to keep fluid intake to around 40oz per day. Patient shares that he is calculating his fluid intake/meauring by using 12 oz water bottles. Patient voices that he received scale and is weighing. Weight staying about the same. Patient reports that he is adhering to diuretic regimen and continues to "pee all the time." He denies any edema and/or SOB. RN CM unable to complete med review as patient not at home. He denies any RN CM needs or concerns at this time.    [facility] CM Care Plan Problem One     Most Recent Value  Care Plan Problem One  Knowledge deficit related to disease process and mgmt of COPD.  Role Documenting the Problem One  Care Management Telephonic [place] for Problem One  Active  [facility] Long Term Goal   Patient will have no hospitalizations and exacerbation of symptoms over the next 60 days.  [facility] Long Term Goal Start Date  11/01/18  [facility] CM Short Term Goal #1   Patient will be able to verbalize COPD action action for worsening of symptoms over the next 30 days.  [facility] CM Short Term Goal #1 Start Date  11/01/18    [facility] CM Care Plan Problem Two     Most Recent Value  Care Plan Problem Two  Knowledge deficit related to disease process and mgmt of symptoms related to CHF  Role Documenting the Problem Two  Care Management Telephonic Coordinator  Care Plan for Problem Two  Active  Interventions for Problem Two Long Term Goal   RN CM confirmed that pt. has  working scale in the home. RN CM reviewed with pt s/s of worsening condition and when to seek medical attention. RN CM reviewed importance of wgt log with pt.   [facility] Long Term Goal  Patient will report consistently monitoring and recording his weight daily for the next 31 days,  [facility] Long Term Goal Start Date  11/01/18  [facility] CM Short Term Goal #1   Patient will report adherence to low salt diet at least 85% of the time over the next 30 days.  [facility] CM Short Term Goal #1 Start Date  11/01/18  [facility] CM Short Term Goal #2   Patient will report adherence to med regimen/diuretics 100% of the time over the next 30 days.  [facility] CM Short Term Goal #2 Start Date  11/01/18  [facility] CM Short Term Goal #2 Met Date  12/03/18    [facility] CM Care Plan Problem Three     Most Recent Value  Care Plan Problem Three  Knowledge deficit related to risks of smoking.  Role Documenting the Problem Three  Care Management Telephonic Coordinator  Care Plan for Problem Three  Active  [facility] Long Term Goal   Patient will successfully quit smoking and/or decrease daily cigarette intake over the next 60 days.  [facility] Long Term Goal Start Date  11/05/18  Interventions for Problem Three Long Term Goal  RN CM  did not address this call  [facility] CM Short Term Goal #1   Patient will report taking and adherence to smoking cessation meds at least 90% of the time over the next 30 days.  [facility] CM Short Term Goal #1 Start Date  11/05/18  [facility] CM Short Term Goal #2   Patient will schedule an appt for smoking cessation class over the next 30 days.  [facility] CM Short Term Goal #2 Start Date  11/05/18  Interventions for Short Term Goal #2  RNCM did not address this call.        Plan: RN CM discussed with patient next outreach within a month. Patient gave verbal consent and in agreement with RN CM follow up timeframe. Patient aware that they may contact RN CM sooner for any issues or concerns.    , RN,BSN,CCM [facility] Care Management Telephonic Care  Management Coordinator Direct Phone: [phone] Toll Free: [phone] Fax: [phone]  

## 2018-12-03 NOTE — Telephone Encounter (Signed)
Mary, please advise if you have been able to contact pt. Thanks!

## 2018-12-06 DIAGNOSIS — Z79899 Other long term (current) drug therapy: Secondary | ICD-10-CM | POA: Diagnosis not present

## 2018-12-06 DIAGNOSIS — M542 Cervicalgia: Secondary | ICD-10-CM | POA: Diagnosis not present

## 2018-12-09 ENCOUNTER — Telehealth: Payer: Self-pay

## 2018-12-09 ENCOUNTER — Telehealth: Payer: Self-pay | Admitting: Pharmacist

## 2018-12-09 DIAGNOSIS — J449 Chronic obstructive pulmonary disease, unspecified: Secondary | ICD-10-CM

## 2018-12-09 NOTE — Telephone Encounter (Signed)
Called pt on 12/09/2018 at 3:15PM.   Patient states he started using NRT patches and lozenges yesterday. He does not sleep with the patch on because he heard from a friend that it can cause nightmares. He states he smoked 1/2 cigarette last night (11/15) and has not smoked any cigarettes today (11/16). He states he considers today his quit date, 12/09/2018. He states he discussed smoking cessation with his psychiatrist, who wrote him a prescription for bupropion SR 150 mg 1-2 tablets daily. Explained to patient titration schedule of bupropion SR 150 mg for 3 days then increase bupropion SR 150 mg twice daily (one tablet 8 hours afterwards) and recommended he increase dose on 12/12/2018. Patient had questions related to sexual dysfunction with bupropion; explained to patient the incidence reported is 3%. Patient is planning on using mints and root-beer flavored candy once he is able to go to the grocery store tomorrow. Patient has not called [place] yet.  Quit Date: 12/09/2018 Age when started using tobacco on a daily basis: 58 years old Preferred cigarette brand: Newport 100s -Does not smoke entire cigarette at once Tobacco use: 1/2 cigarette last night, none today Triggers: eating meals, watching "good programs" (football), edible marijuana, smoking marijuana, using the bathroom, fighting with family members, caretaker for grandchildren, wife smokes cigarettes, marital issues, multiple family members died from COVID-72  Current smoking cessation agents: NRT patch 14 mg daily, NRT lozenge 2 mg, bupropion 150 mg daily  Prior smoking cessation agents: bupropion (does not remember) Non-pharmacologic methods: mints, root-beer flavored candy  Motivation to quit: concern for health Rates IMPORTANCE of quitting tobacco on 1-10 scale of 10 Rates CONFIDENCE of quitting tobacco on 1-10 scale of 10  Goal: continue not smoking any cigarettes   Plan:  1. Follow up with [place] 2. Start using  mints/root-beer flavored candy for cravings 3. Continue NRT patch 14 mg daily, NRT lozenge 2 mg, bupropion 150 mg daily  Follow up: 12/27/2018

## 2018-12-09 NOTE — Telephone Encounter (Signed)
Call returned to patient, confirmed DOB, he states he no longer needs assistance. He states he uses ventolin brand only.   Nothing further needed at this time.

## 2018-12-09 NOTE — Telephone Encounter (Signed)
Call made to patient to address a different matter, requesting a nebulizer machine. I made him aware I would send the message to Dr. Lamonte Sakai.   Please advise, can we place order for a new nebulizer machine. Thanks

## 2018-12-10 NOTE — Progress Notes (Deleted)
Cardiology Office Note   Date:  12/10/2018   ID:  Scott [name], DOB August 20, 1960, MRN [id number]  PCP:  Fredrich Romans, [place]  Cardiologist:  Hochrein  No chief complaint on file.    History of Present Illness: Scott [name] is a 58 y.o. male who presents for ongoing assessment and management of chronic systolic heart failure, nonischemic cardiomyopathy, hospitalization for acute respiratory failure October 2020, with right and left heart catheterization revealing no angiographic evidence of CAD with right heart cath revealing volume overload with an EF of 30% to 35%.  He was seen last by Dr. Percival Spanish on 11/28/2018.  He thought he felt his heart racing and associated shortness of breath when lying flat but would did not describe any symptoms of PND or orthopnea.  He did not appear to have any evidence of volume overload.  On last office visit the patient was counseled on salt and fluid restriction.  The plan was to take him off of current beta-blocker and start him on bisoprolol as it was a more beta selective medication.  Consideration for more advanced therapies would be discussed on follow-up.  Dr. Percival Spanish ordered a BMET for evaluation of his kidney function.  Creatinine was found to be 1.36 up from 1.17, potassium 4.5, Dr. Percival Spanish did review this and this was to be followed on subsequent visits.  Past Medical History:  Diagnosis Date  . 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 COPD (chronic obstructive pulmonary disease) ([place])   . Diabetes mellitus without complication ([place])   . GERD (gastroesophageal reflux disease)   . Headache(784.0)   . Pneumonia    hx    Past Surgical History:  Procedure Laterality Date  . ANTERIOR CERVICAL DECOMP/DISCECTOMY FUSION  12/22/2010   Procedure: ANTERIOR CERVICAL DECOMPRESSION/DISCECTOMY FUSION 3 LEVELS;  Surgeon: Ophelia Charter;  Location: [place] [facility];  Service: Neurosurgery;  Laterality: N/A;  Anterior cervical discectomy with  fusion cervical three-four, four-five, and five sixCDF with Interbody Prosthesis, plating, and Bone Graft   . ANTERIOR CERVICAL DECOMP/DISCECTOMY FUSION N/A 10/16/2012   Procedure: CERVICAL SIX-SEVEN ANTERIOR CERVICAL DECOMPRESSION/DISCECTOMY FUSION WITH INTERBODY PROTHESIS PLATING BONEGRAFT WITH /POSSIBLE HARDWARE REMOVAL OLD PLATE;  Surgeon: Ophelia Charter, MD;  Location: [place] [facility];  Service: Neurosurgery;  Laterality: N/A;  . MULTIPLE TOOTH EXTRACTIONS    . OTHER SURGICAL HISTORY     surgery on cheekbone and head for fall 2000  . 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 RIGHT/LEFT HEART CATH AND CORONARY ANGIOGRAPHY N/A 09/23/2018   Procedure: RIGHT/LEFT HEART CATH AND CORONARY ANGIOGRAPHY and possible PCI/stent;  Surgeon: MBurnell Blanks MD;  [facility];  Service: Cardiovascular;  Laterality: N/A;  . SPINE SURGERY     2014 and 2016 - Dr JArnoldo Morale . TONSILLECTOMY       Current Outpatient Medications  Medication Sig Dispense Refill  . albuterol (VENTOLIN HFA) 108 (90 Base) MCG/ACT inhaler Inhale 1-2 puffs into the lungs every 6 (six) hours as needed for wheezing or shortness of breath. 18 g 11  . aspirin 325 MG EC tablet Take 81 mg by mouth daily.    . bisoprolol (ZEBETA) 5 MG tablet Take 1 tablet (5 mg total) by mouth daily. 90 tablet 1  . buPROPion (WELLBUTRIN SR) 150 MG 12 hr tablet Take  150 mg by mouth 2 (two) times daily. Start using 150 mg SR daily for 3 days then increase to 150 mg SR twice daily (8 hours apart). Started using on 11/16 and expected to titrate on 11/19.    . COMBIVENT RESPIMAT 20-100 MCG/ACT AERS respimat INHALE 1 PUFF BY MOUTH INTO THE LUNGS FOUR TIMES A DAY 4 g 0  . diazepam (VALIUM) 5 MG tablet Take 5 mg by mouth 2 (two) times daily as needed for anxiety.    Marland Kitchen FLUoxetine (PROZAC) 20 MG capsule Take 20 mg by mouth daily.     .  Fluticasone-Salmeterol (ADVAIR) 250-50 MCG/DOSE AEPB Inhale 1 puff into the lungs 2 (two) times daily.    . furosemide (LASIX) 40 MG tablet Take 1 tablet (40 mg total) by mouth daily. 90 tablet 3  . gabapentin (NEURONTIN) 300 MG capsule TAKE 1 CAPSULE BY MOUTH 3 TIMES DAILY 90 capsule 0  . ipratropium-albuterol (DUONEB) 0.5-2.5 (3) MG/3ML SOLN INHALE THE CONTENTS OF 1 VIAL VIA NEBULIZER EVERY 6 HOURS AS DIRECTED FOR SHORTNESS OF BREATH OR WHEEZING (Patient taking differently: Inhale 3 mLs into the lungs 4 (four) times daily. INHALE THE CONTENTS OF 1 VIAL VIA NEBULIZER EVERY 6 HOURS AS DIRECTED FOR SHORTNESS OF BREATH OR WHEEZING) 360 mL 5  . JANUMET 50-500 MG tablet TAKE 1 TABLET BY MOUTH ONCE A DAY 90 tablet 0  . NARCAN 4 MG/0.1ML LIQD nasal spray kit     . nicotine (NICODERM CQ - DOSED IN MG/24 HOURS) 14 mg/24hr patch Place 1 patch (14 mg total) onto the skin daily for 28 days. For weeks 1-4. 28 patch 0  . nicotine polacrilex (NICOTINE MINI) 2 MG lozenge Take 1 lozenge (2 mg total) by mouth as needed for smoking cessation. (maximum: 5 lozenges every 6 hours; 20 lozenges/day). 288 tablet 11  . omeprazole (PRILOSEC) 40 MG capsule TAKE 1 CAPSULE BY MOUTH EVERY DAY 30 capsule 0  . oxyCODONE-acetaminophen (PERCOCET) 10-325 MG tablet Take 1 tablet by mouth every 4 (four) hours. 15 tablet 0  . rosuvastatin (CRESTOR) 10 MG tablet Take 10 mg by mouth daily.    . sacubitril-valsartan (ENTRESTO) 24-26 MG Take 1 tablet by mouth 2 (two) times daily. 180 tablet 3  . Vitamin D, Ergocalciferol, (DRISDOL) 1.25 MG (50000 UT) CAPS capsule TAKE ONE CAPSULE BY MOUTH ONCE WEEKLY 12 capsule 0   No current facility-administered medications for this visit.     Allergies:   Penicillins and Cymbalta [duloxetine hcl]    Social History:  The patient  reports that he has been smoking cigarettes. He started smoking about 45 years ago. He has a 41.00 pack-year smoking history. He has never used smokeless tobacco. He reports  current alcohol use. He reports that he does not use drugs.   Family History:  The patient's family history includes Asthma in his brother and sister; COPD in his mother; Diabetes in his brother; Emphysema in his mother; Heart disease in his maternal uncle; Hyperlipidemia in his brother and sister; Hypertension in his brother, brother, and sister; Mental illness in his sister.    ROS: All other systems are reviewed and negative. Unless otherwise mentioned in H&P    PHYSICAL EXAM: VS:  There were no vitals taken for this visit. , BMI There is no height or weight on file to calculate BMI. GEN: Well nourished, well developed, in no acute distress HEENT: normal Neck: no JVD, carotid bruits, or masses Cardiac: ***RRR; no murmurs, rubs, or gallops,no edema  Respiratory:  Clear to auscultation bilaterally, normal work of breathing GI: soft, nontender, nondistended, + BS MS: no deformity or atrophy Skin: warm and dry, no rash Neuro:  Strength and sensation are intact Psych: euthymic mood, full affect   EKG:  EKG {ACTION; IS/IS [id number]} ordered today. The ekg ordered today demonstrates ***   Recent Labs: 03/01/2018: TSH 1.110 04/05/2018: Magnesium 2.0 09/20/2018: ALT 23; B Natriuretic Peptide 1,680.8 09/24/2018: Hemoglobin 15.0; Platelets 303 11/28/2018: BUN 17; Creatinine, Ser 1.36; Potassium 4.5; Sodium 149    Lipid Panel    Component Value Date/Time   CHOL 161 03/01/2018 0958   TRIG 140 05/26/2018 1209   HDL 40 03/01/2018 0958   CHOLHDL 4.0 03/01/2018 0958   LDLCALC 89 03/01/2018 0958      Wt Readings from Last 3 Encounters:  11/28/18 217 lb 6.4 oz (98.6 kg)  10/28/18 220 lb 12.8 oz (100.2 kg)  10/24/18 214 lb 11.2 oz (97.4 kg)      Other studies Reviewed:  LHC 09/23/2018 1. No angiographic evidence of CAD 2. Non-ischemic cardiomyopathy 3. Elevated filling pressures.   Recommendations: Medical management of non-ischemic cardiomyopathy. Pressures suggest continued  volume overload. Continue IV diuresis.   Echocardiogram 09/20/2018 1. The left ventricle has moderate-severely reduced systolic function, with an ejection fraction of 30-35%. The cavity size was moderately dilated. Indeterminate diastolic filling due to E-A fusion. Left ventricular diffuse hypokinesis.  2. The right ventricle has mildly reduced systolic function. The cavity was moderately enlarged. There is no increase in right ventricular wall thickness.  3. Left atrial size was severely dilated.  4. Right atrial size was severely dilated.  5. The pericardial effusion is circumferential.  6. Trivial pericardial effusion is present.  7. The mitral valve is abnormal. Moderate thickening of the mitral valve leaflet.  8. The tricuspid valve is grossly normal.  9. The aortic valve is tricuspid. Mild sclerosis of the aortic valve. No stenosis of the aortic valve. 10. The aorta is normal unless otherwise noted. 11. The inferior vena cava was dilated in size with <50% respiratory variability.   ASSESSMENT AND PLAN:  1.  ***   Current medicines are reviewed at length with the patient today.    Labs/ tests ordered today include: *** Phill Myron. [place], ANP, AACC   12/10/2018 2:21 PM    [facility] [place] [address] Office [phone] Fax [phone]  Notice: This dictation was prepared with Dragon dictation along with smaller phrase technology. Any transcriptional errors that result from this process are unintentional and may not be corrected upon review.

## 2018-12-10 NOTE — Telephone Encounter (Signed)
Yes okay to send order for nebulizer.  Wyn Quaker FNP

## 2018-12-10 NOTE — Telephone Encounter (Signed)
Sending to APP of the day as RB is unavailable.

## 2018-12-10 NOTE — Telephone Encounter (Signed)
Nebulizer ordered as requested.  Nothing further needed at this time- will close encounter.

## 2018-12-11 ENCOUNTER — Ambulatory Visit: Payer: Medicare HMO | Admitting: Adult Health

## 2018-12-11 ENCOUNTER — Other Ambulatory Visit: Payer: Self-pay | Admitting: Pulmonary Disease

## 2018-12-11 DIAGNOSIS — F17200 Nicotine dependence, unspecified, uncomplicated: Secondary | ICD-10-CM

## 2018-12-11 NOTE — Telephone Encounter (Signed)
Aaron Edelman, please advise if it is okay to refill.

## 2018-12-17 ENCOUNTER — Other Ambulatory Visit: Payer: Self-pay | Admitting: Family Medicine

## 2018-12-17 ENCOUNTER — Other Ambulatory Visit: Payer: Self-pay | Admitting: Emergency Medicine

## 2018-12-17 ENCOUNTER — Other Ambulatory Visit: Payer: Self-pay | Admitting: Pulmonary Disease

## 2018-12-17 DIAGNOSIS — F17200 Nicotine dependence, unspecified, uncomplicated: Secondary | ICD-10-CM

## 2018-12-18 NOTE — Telephone Encounter (Signed)
Requested medication (s) are due for refill today: no  Requested medication (s) are on the active medication list: yes  Last refill:  10/24/2018  Future visit scheduled: not  Notes to clinic: not delegated    Requested Prescriptions  Pending Prescriptions Disp Refills   Vitamin D, Ergocalciferol, (DRISDOL) 1.25 MG (50000 UT) CAPS capsule [Pharmacy Med Name: VITAMIN D2 50,000 IU] 12 capsule 0    Sig: [place] WEEKLY     Endocrinology:  Vitamins - Vitamin D Supplementation Failed - 12/17/2018 10:15 PM      Failed - 50,000 IU strengths are not delegated      Failed - Phosphate in normal range and within 360 days    No results found for: PHOS       Failed - Vitamin D in normal range and within 360 days    Vit D, 25-Hydroxy  Date Value Ref Range Status  03/01/2018 23.0 (L) 30.0 - 100.0 ng/mL Final    Comment:    Vitamin D deficiency has been defined by the Institute of Medicine and an Endocrine Society practice guideline as a level of serum 25-OH vitamin D less than 20 ng/mL (1,2). The Endocrine Society went on to further define vitamin D insufficiency as a level between 21 and 29 ng/mL (2). 1. IOM (Institute of Medicine). 2010. Dietary reference    intakes for calcium and D. [place]: The    [employer]. 2. Holick MF, [place], Bischoff-Ferrari HA, et al.    Evaluation, treatment, and prevention of vitamin D    deficiency: an Endocrine Society clinical practice    guideline. JCEM. 2011 Jul; 96(7):1911-30.          Passed - Ca in normal range and within 360 days    Calcium  Date Value Ref Range Status  11/28/2018 9.7 8.7 - 10.2 mg/dL Final         Passed - Valid encounter within last 12 months    Recent Outpatient Visits          6 months ago SOB (shortness of breath)   [facility], Lilia Argue, MD   6 months ago COPD exacerbation [facility] [place])   [facility], Lilia Argue, MD   6 months ago Medication  management   [facility], Lilia Argue, MD   7 months ago Oral candidiasis   [facility], Lilia Argue, MD   8 months ago COPD exacerbation [facility]   [facility], Lilia Argue, MD      Future Appointments            In 6 days Lendon Colonel, NP [facility] [place], [place]   In 1 week [place], Rose Fillers, MD [facility]

## 2018-12-23 NOTE — Progress Notes (Signed)
Cardiology Office Note   Date:  12/24/2018   ID:  Scott [name], DOB 25-Jul-1960, MRN [id number]  PCP:  Fredrich Romans, PA  CC: Dyspnea     History of Present Illness: Scott [name] is a 58 y.o. male who presents for ongoing assessment and management of chronic systolic heart failure, nonischemic cardiomyopathy, recently seen by Dr. Percival Spanish on 11/28/2018 .  The patient underwent a right and left heart catheterization at that time which revealed no angiographic evidence of CAD, right heart cath revealed volume overload with an EF of 30% to 35%.  He was continued on Entresto 24/26 mg twice daily and lisinopril was discontinued.  He remained on metoprolol and Lasix 40 mg daily.  At that office visit the patient complains of feeling his heart racing with associated shortness of breath especially when lying flat, but did not describe PND or orthopnea.  He unfortunately continues to smoke.  He does wear O2 at at bedtime.  The patient was changed to bisoprolol  5 mg daily as this was more cardioselective in the setting of chronic wheezing.  He was again counseled on smoking cessation.  He comes today more dyspneic, weight is up 9  Lbs, having PND and orthopnea, and early satiety. He admits to eating highly salted foods over Thanksgiving holiday. He unfortunately continues to smoke  He reports that his diuretic is not longer working.   Past Medical History:  Diagnosis Date  . 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 COPD (chronic obstructive pulmonary disease) ([place])   . Diabetes mellitus without complication ([place])   . GERD (gastroesophageal reflux disease)   . Headache(784.0)   . Pneumonia    hx    Past Surgical History:  Procedure Laterality Date  . ANTERIOR CERVICAL DECOMP/DISCECTOMY FUSION  12/22/2010   Procedure: ANTERIOR CERVICAL DECOMPRESSION/DISCECTOMY FUSION 3 LEVELS;  Surgeon: Ophelia Charter;  Location: [facility];  Service: Neurosurgery;  Laterality: N/A;   Anterior cervical discectomy with fusion cervical three-four, four-five, and five sixCDF with Interbody Prosthesis, plating, and Bone Graft   . ANTERIOR CERVICAL DECOMP/DISCECTOMY FUSION N/A 10/16/2012   Procedure: CERVICAL SIX-SEVEN ANTERIOR CERVICAL DECOMPRESSION/DISCECTOMY FUSION WITH INTERBODY PROTHESIS PLATING BONEGRAFT WITH /POSSIBLE HARDWARE REMOVAL OLD PLATE;  Surgeon: Ophelia Charter, MD;  Location: [facility];  Service: Neurosurgery;  Laterality: N/A;  . MULTIPLE TOOTH EXTRACTIONS    . OTHER SURGICAL HISTORY     surgery on cheekbone and head for fall 2000  . 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 RIGHT/LEFT HEART CATH AND CORONARY ANGIOGRAPHY N/A 09/23/2018   Procedure: RIGHT/LEFT HEART CATH AND CORONARY ANGIOGRAPHY and possible PCI/stent;  Surgeon: MBurnell Blanks MD;  [facility];  Service: Cardiovascular;  Laterality: N/A;  . SPINE SURGERY     2014 and 2016 - Dr JArnoldo Morale . TONSILLECTOMY       Current Outpatient Medications  Medication Sig Dispense Refill  . ADVAIR DISKUS 250-50 MCG/DOSE AEPB INHALE 1 PUFF INTO THE LUNGS TWICE DAILY 60 each 5  . albuterol (VENTOLIN HFA) 108 (90 Base) MCG/ACT inhaler Inhale 1-2 puffs into the lungs every 6 (six) hours as needed for wheezing or shortness of breath. 18 g 11  . aspirin 325 MG EC tablet Take 81 mg by mouth daily.    . bisoprolol (ZEBETA)  5 MG tablet Take 1 tablet (5 mg total) by mouth daily. 90 tablet 1  . buPROPion (WELLBUTRIN SR) 150 MG 12 hr tablet Take 150 mg by mouth 2 (two) times daily. Start using 150 mg SR daily for 3 days then increase to 150 mg SR twice daily (8 hours apart). Started using on 11/16 and expected to titrate on 11/19.    . COMBIVENT RESPIMAT 20-100 MCG/ACT AERS respimat INHALE 1 PUFF BY MOUTH INTO THE LUNGS FOUR TIMES A DAY 4 g 0  . diazepam (VALIUM) 5 MG tablet Take 5 mg by mouth 2  (two) times daily as needed for anxiety.    . furosemide (LASIX) 40 MG tablet Take 1 tablet (40 mg total) by mouth daily. 90 tablet 3  . gabapentin (NEURONTIN) 300 MG capsule TAKE 1 CAPSULE BY MOUTH 3 TIMES DAILY 90 capsule 0  . ipratropium-albuterol (DUONEB) 0.5-2.5 (3) MG/3ML SOLN INHALE THE CONTENTS OF 1 VIAL VIA NEBULIZER EVERY 6 HOURS AS DIRECTED FOR SHORTNESS OF BREATH OR WHEEZING (Patient taking differently: Inhale 3 mLs into the lungs 4 (four) times daily. INHALE THE CONTENTS OF 1 VIAL VIA NEBULIZER EVERY 6 HOURS AS DIRECTED FOR SHORTNESS OF BREATH OR WHEEZING) 360 mL 5  . JANUMET 50-500 MG tablet TAKE 1 TABLET BY MOUTH ONCE A DAY 90 tablet 0  . NARCAN 4 MG/0.1ML LIQD nasal spray kit     . nicotine (NICODERM CQ - DOSED IN MG/24 HOURS) 14 mg/24hr patch PLACE 1 PATCH ONTO THE SKIN DAILY FOR 28 DAYS 28 patch 2  . nicotine polacrilex (NICOTINE MINI) 2 MG lozenge Take 1 lozenge (2 mg total) by mouth as needed for smoking cessation. (maximum: 5 lozenges every 6 hours; 20 lozenges/day). 288 tablet 11  . omeprazole (PRILOSEC) 40 MG capsule TAKE 1 CAPSULE BY MOUTH EVERY DAY 30 capsule 0  . oxyCODONE-acetaminophen (PERCOCET) 10-325 MG tablet Take 1 tablet by mouth every 4 (four) hours. 15 tablet 0  . rosuvastatin (CRESTOR) 10 MG tablet Take 10 mg by mouth daily.    . sacubitril-valsartan (ENTRESTO) 24-26 MG Take 1 tablet by mouth 2 (two) times daily. 180 tablet 3  . Vitamin D, Ergocalciferol, (DRISDOL) 1.25 MG (50000 UT) CAPS capsule TAKE ONE CAPSULE BY MOUTH ONCE WEEKLY 12 capsule 0   No current facility-administered medications for this visit.     Allergies:   Penicillins and Cymbalta [duloxetine hcl]    Social History:  The patient  reports that he has been smoking cigarettes. He started smoking about 45 years ago. He has a 41.00 pack-year smoking history. He has never used smokeless tobacco. He reports current alcohol use. He reports that he does not use drugs.   Family History:  The  patient's family history includes Asthma in his brother and sister; COPD in his mother; Diabetes in his brother; Emphysema in his mother; Heart disease in his maternal uncle; Hyperlipidemia in his brother and sister; Hypertension in his brother, brother, and sister; Mental illness in his sister.    ROS: All other systems are reviewed and negative. Unless otherwise mentioned in H&P    PHYSICAL EXAM: VS:  BP 114/64   Pulse 74   Ht _0  (1.727 m)   Wt 226 lb (102.5 kg)   BMI 34.36 kg/m  , BMI Body mass index is 34.36 kg/m. GEN: Well nourished, well developed, in no acute distress HEENT: normal Neck: no JVD, carotid bruits, or masses Cardiac: Distant heart sounds, obscured by breath sounds.RRR; no murmurs, rubs, or  gallops,no edema Elevated JVD.  Respiratory:Inspiratory and expiratory  Wheezes, diminished in the bases with R>L crackles.  GI: soft, nontender, mild distended, ballotment , + BS MS: no deformity or atrophy Skin: warm and dry, no rash Neuro:  Strength and sensation are intact Psych: euthymic mood, full affect   EKG: SR 1st degree AV Block with left axis deviation. Lateral T-wave inversion. (Unchanged rome EKG dated   Recent Labs: 03/01/2018: TSH 1.110 04/05/2018: Magnesium 2.0 09/20/2018: ALT 23; B Natriuretic Peptide 1,680.8 09/24/2018: Hemoglobin 15.0; Platelets 303 11/28/2018: BUN 17; Creatinine, Ser 1.36; Potassium 4.5; Sodium 149    Lipid Panel    Component Value Date/Time   CHOL 161 03/01/2018 0958   TRIG 140 05/26/2018 1209   HDL 40 03/01/2018 0958   CHOLHDL 4.0 03/01/2018 0958   LDLCALC 89 03/01/2018 0958      Wt Readings from Last 3 Encounters:  12/24/18 226 lb (102.5 kg)  11/28/18 217 lb 6.4 oz (98.6 kg)  10/28/18 220 lb 12.8 oz (100.2 kg)      Other studies Reviewed: RHC/LHC 09/23/2018 1. No angiographic evidence of CAD 2. Non-ischemic cardiomyopathy 3. Elevated filling pressures.   Recommendations: Medical management of non-ischemic  cardiomyopathy. Pressures suggest continued volume overload. Continue IV diuresis.   Echocardiogram 09/20/2018 1. The left ventricle has moderate-severely reduced systolic function, with an ejection fraction of 30-35%. The cavity size was moderately dilated. Indeterminate diastolic filling due to E-A fusion. Left ventricular diffuse hypokinesis.  2. The right ventricle has mildly reduced systolic function. The cavity was moderately enlarged. There is no increase in right ventricular wall thickness.  3. Left atrial size was severely dilated.  4. Right atrial size was severely dilated.  5. The pericardial effusion is circumferential.  6. Trivial pericardial effusion is present.  7. The mitral valve is abnormal. Moderate thickening of the mitral valve leaflet.  8. The tricuspid valve is grossly normal.  9. The aortic valve is tricuspid. Mild sclerosis of the aortic valve. No stenosis of the aortic valve. 10. The aorta is normal unless otherwise noted. 11. The inferior vena cava was dilated in size with <50% respiratory variability.  ASSESSMENT AND PLAN:  1. Acute on chronic systolic CHF: In the setting of NICM. EF of 30%-35%.  He is volume overloaded on exam with 9 lb weight gain. He admitted to eating salty foods over the Thanksgiving holiday.  He is symptomatic with PND , orthopnea and early satiety Breathing status as worsened.    He will have his lasix changed to torsemide 20 mg BID for 2 days and then return to torsemide 20 mg daily thereafter. . Will recheck his echocardiogram. Continue bisoprolol. If no better, may need admission.   2.  Ongoing tobacco abuse: Has continue to smoke despite numerous counseling sessions.    3.Type II Diabetes: Check his Hgb A1c and continue Janument as directed.    Current medicines are reviewed at length with the patient today.    Labs/ tests ordered today include: Echo   Phill Myron. West Pugh, ANP, AACC   12/24/2018 8:56 AM    [address] Office [phone] Fax [phone]  Notice: This dictation was prepared with Dragon dictation along with smaller phrase technology. Any transcriptional errors that result from this process are unintentional and may not be corrected upon review.

## 2018-12-24 ENCOUNTER — Encounter: Payer: Self-pay | Admitting: Adult Health

## 2018-12-24 ENCOUNTER — Observation Stay: Admission: AD | Admit: 2018-12-24 | Payer: Medicare HMO | Source: Ambulatory Visit | Admitting: Cardiology

## 2018-12-24 ENCOUNTER — Other Ambulatory Visit: Payer: Self-pay

## 2018-12-24 ENCOUNTER — Ambulatory Visit (INDEPENDENT_AMBULATORY_CARE_PROVIDER_SITE_OTHER): Payer: Medicare HMO | Admitting: Adult Health

## 2018-12-24 VITALS — BP 114/64 | HR 74 | Ht 68.0 in | Wt 226.0 lb

## 2018-12-24 DIAGNOSIS — I10 Essential (primary) hypertension: Secondary | ICD-10-CM

## 2018-12-24 DIAGNOSIS — I428 Other cardiomyopathies: Secondary | ICD-10-CM

## 2018-12-24 DIAGNOSIS — J449 Chronic obstructive pulmonary disease, unspecified: Secondary | ICD-10-CM

## 2018-12-24 DIAGNOSIS — I5023 Acute on chronic systolic (congestive) heart failure: Secondary | ICD-10-CM | POA: Diagnosis not present

## 2018-12-24 MED ORDER — TORSEMIDE 20 MG PO TABS: ORAL_TABLET | ORAL | 0 refills | Status: DC

## 2018-12-24 NOTE — Patient Instructions (Signed)
Medication Instructions:  STOP taking Furosemide (Lasix).  START Torsemide 20 mg---take 1 tablet twice daily for 3 days; then, decrease to 1 tablet daily.  *If you need a refill on your cardiac medications before your next appointment, please call your pharmacy*   Testing/Procedures:  Thursday or Friday if possible---urgent:  Your physician has requested that you have an echocardiogram. Echocardiography is a painless test that uses sound waves to create images of your heart. It provides your doctor with information about the size and shape of your heart and how well your heart's chambers and valves are working. This procedure takes approximately one hour. There are no restrictions for this procedure.    Follow-Up: At [facility], you and your health needs are our priority.  As part of our continuing mission to provide you with exceptional heart care, we have created designated Provider Care Teams.  These Care Teams include your primary Cardiologist (physician) and Advanced Practice Providers (APPs -  Physician Assistants and Nurse Practitioners) who all work together to provide you with the care you need, when you need it.  Your next appointment:   Monday, 12/30/18 at 2:15 PM  The format for your next appointment:   In Person  Provider:   Jory Sims, NP

## 2018-12-25 NOTE — Progress Notes (Signed)
Cardiology Office Note   Date:  12/30/2018   ID:  Scott [name], DOB 1960-08-01, MRN [id number]  PCP:  Fredrich Romans, PA  Cardiologist:  Dr.Hochrein  CC: Follow Up   History of Present Illness: Scott [name] is a 58 y.o. male who presents for close follow up,  for chronic systolic heart failure, nonischemic cardiomyopathy, status post hospitalization for acute respiratory failure with hypoxemia.Right and left cardiac catheterization which revealed no angiographic evidence of CAD., with right heart cath revealing volume overload. EF was 30% to 35%.    He also has a history of COPD and chronic emphysema.  Other history includes diabetes, and GERD.  He was seen last in the office on 12/24/2018 and was volume overloaded with worsening shortness of breath.  I considered admission for IV diuresis, however there were no beds available.  After discussion with Dr. Stanford Breed, DOD in the office, Lasix was changed to torsemide 20 mg twice daily for 2 days and then 20 mg of torsemide daily thereafter.  He was continued on bisoprolol.Marland Kitchen  He was also scheduled to have a repeat echocardiogram for changes in LV function.   Echocardiogram dated 12/26/2018 revealed an LVEF of 30% to 35%.  Grade 1 diastolic dysfunction.  Normal atrial sizes, no valvular abnormalities were noted.  He comes today having lost only 4 pounds.  He is gone back to torsemide 20 mg daily.  On further investigation of his dietary habits, he has not decreased his salt intake at all.  He continues to eat out a lot, eat tacos, [place] sandwiches, highly salted chili.  He states he got good response from the torsemide.  He continues to have issues with coughing and wheezing and is due to see his pulmonologist tomorrow.  Past Medical History:  Diagnosis Date  . 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 COPD (chronic obstructive pulmonary disease) ([place])   . Diabetes mellitus without complication ([place])   . GERD (gastroesophageal  reflux disease)   . Headache(784.0)   . Pneumonia    hx    Past Surgical History:  Procedure Laterality Date  . ANTERIOR CERVICAL DECOMP/DISCECTOMY FUSION  12/22/2010   Procedure: ANTERIOR CERVICAL DECOMPRESSION/DISCECTOMY FUSION 3 LEVELS;  Surgeon: Ophelia Charter;  Location: [place] [facility];  Service: Neurosurgery;  Laterality: N/A;  Anterior cervical discectomy with fusion cervical three-four, four-five, and five sixCDF with Interbody Prosthesis, plating, and Bone Graft   . ANTERIOR CERVICAL DECOMP/DISCECTOMY FUSION N/A 10/16/2012   Procedure: CERVICAL SIX-SEVEN ANTERIOR CERVICAL DECOMPRESSION/DISCECTOMY FUSION WITH INTERBODY PROTHESIS PLATING BONEGRAFT WITH /POSSIBLE HARDWARE REMOVAL OLD PLATE;  Surgeon: Ophelia Charter, MD;  Location: [facility];  Service: Neurosurgery;  Laterality: N/A;  . MULTIPLE TOOTH EXTRACTIONS    . OTHER SURGICAL HISTORY     surgery on cheekbone and head for fall 2000  . 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 RIGHT/LEFT HEART CATH AND CORONARY ANGIOGRAPHY N/A 09/23/2018   Procedure: RIGHT/LEFT HEART CATH AND CORONARY ANGIOGRAPHY and possible PCI/stent;  Surgeon: MBurnell Blanks MD;  [facility];  Service: Cardiovascular;  Laterality: N/A;  . SPINE SURGERY     2014 and 2016 - Dr JArnoldo Morale . TONSILLECTOMY       Current Outpatient Medications  Medication Sig Dispense Refill  . ADVAIR DISKUS 250-50 MCG/DOSE AEPB INHALE  1 PUFF INTO THE LUNGS TWICE DAILY 60 each 5  . albuterol (VENTOLIN HFA) 108 (90 Base) MCG/ACT inhaler Inhale 1-2 puffs into the lungs every 6 (six) hours as needed for wheezing or shortness of breath. 18 g 11  . aspirin 325 MG EC tablet Take 81 mg by mouth daily.    . bisoprolol (ZEBETA) 5 MG tablet Take 1 tablet (5 mg total) by mouth daily. 90 tablet 1  . buPROPion (WELLBUTRIN SR) 150 MG 12 hr tablet Take 150 mg by mouth  2 (two) times daily. Start using 150 mg SR daily for 3 days then increase to 150 mg SR twice daily (8 hours apart). Started using on 11/16 and expected to titrate on 11/19.    . COMBIVENT RESPIMAT 20-100 MCG/ACT AERS respimat INHALE 1 PUFF BY MOUTH INTO THE LUNGS FOUR TIMES A DAY 4 g 0  . diazepam (VALIUM) 5 MG tablet Take 5 mg by mouth 2 (two) times daily as needed for anxiety.    . gabapentin (NEURONTIN) 300 MG capsule TAKE 1 CAPSULE BY MOUTH 3 TIMES DAILY 90 capsule 0  . ipratropium-albuterol (DUONEB) 0.5-2.5 (3) MG/3ML SOLN INHALE THE CONTENTS OF 1 VIAL VIA NEBULIZER EVERY 6 HOURS AS DIRECTED FOR SHORTNESS OF BREATH OR WHEEZING (Patient taking differently: Inhale 3 mLs into the lungs 4 (four) times daily. INHALE THE CONTENTS OF 1 VIAL VIA NEBULIZER EVERY 6 HOURS AS DIRECTED FOR SHORTNESS OF BREATH OR WHEEZING) 360 mL 5  . JANUMET 50-500 MG tablet TAKE 1 TABLET BY MOUTH ONCE A DAY 90 tablet 0  . NARCAN 4 MG/0.1ML LIQD nasal spray kit     . nicotine (NICODERM CQ - DOSED IN MG/24 HOURS) 14 mg/24hr patch PLACE 1 PATCH ONTO THE SKIN DAILY FOR 28 DAYS 28 patch 2  . nicotine polacrilex (NICOTINE MINI) 2 MG lozenge Take 1 lozenge (2 mg total) by mouth as needed for smoking cessation. (maximum: 5 lozenges every 6 hours; 20 lozenges/day). 288 tablet 11  . omeprazole (PRILOSEC) 40 MG capsule TAKE 1 CAPSULE BY MOUTH EVERY DAY 30 capsule 0  . oxyCODONE-acetaminophen (PERCOCET) 10-325 MG tablet Take 1 tablet by mouth every 4 (four) hours. 15 tablet 0  . rosuvastatin (CRESTOR) 10 MG tablet Take 10 mg by mouth daily.    . sacubitril-valsartan (ENTRESTO) 24-26 MG Take 1 tablet by mouth 2 (two) times daily. 180 tablet 3  . torsemide (DEMADEX) 20 MG tablet Take 1 tablet by mouth twice daily for 3 days. Then, decrease to 1 tablet daily. 180 tablet 0  . Vitamin D, Ergocalciferol, (DRISDOL) 1.25 MG (50000 UT) CAPS capsule TAKE ONE CAPSULE BY MOUTH ONCE WEEKLY 12 capsule 0   No current facility-administered medications  for this visit.     Allergies:   Penicillins and Cymbalta [duloxetine hcl]    Social History:  The patient  reports that he has been smoking cigarettes. He started smoking about 45 years ago. He has a 41.00 pack-year smoking history. He has never used smokeless tobacco. He reports current alcohol use. He reports that he does not use drugs.   Family History:  The patient's family history includes Asthma in his brother and sister; COPD in his mother; Diabetes in his brother; Emphysema in his mother; Heart disease in his maternal uncle; Hyperlipidemia in his brother and sister; Hypertension in his brother, brother, and sister; Mental illness in his sister.    ROS: All other systems are reviewed and negative. Unless otherwise mentioned in H&P    PHYSICAL  EXAM: VS:  BP 102/76   Pulse 94   Temp (!) 97.4 F (36.3 C)   Ht _0  (1.727 m)   Wt 222 lb 9.6 oz (101 kg)   SpO2 91%   BMI 33.85 kg/m  , BMI Body mass index is 33.85 kg/m. GEN: Well nourished, well developed, in no acute distress HEENT: normal Neck: no JVD, carotid bruits, or masses Cardiac: RRR; tachycardic, no murmurs, rubs, or gallops,no edema  Respiratory: Inspiratory wheezes, crackles at the bases mild  MS: no deformity or atrophy Skin: warm and dry, no rash Neuro:  Strength and sensation are intact Psych: euthymic mood, full affect   EKG: Not completed this office visit.  Recent Labs: 03/01/2018: TSH 1.110 04/05/2018: Magnesium 2.0 09/20/2018: ALT 23; B Natriuretic Peptide 1,680.8 09/24/2018: Hemoglobin 15.0; Platelets 303 11/28/2018: BUN 17; Creatinine, Ser 1.36; Potassium 4.5; Sodium 149    Lipid Panel    Component Value Date/Time   CHOL 161 03/01/2018 0958   TRIG 140 05/26/2018 1209   HDL 40 03/01/2018 0958   CHOLHDL 4.0 03/01/2018 0958   LDLCALC 89 03/01/2018 0958      Wt Readings from Last 3 Encounters:  12/30/18 222 lb 9.6 oz (101 kg)  12/24/18 226 lb (102.5 kg)  11/28/18 217 lb 6.4 oz (98.6 kg)       Other studies Reviewed: Echocardiogram 12-29-18 1. Left ventricular ejection fraction, by visual estimation, is 30 to 35%. The left ventricle has moderate to severely decreased function. Left ventricular septal wall thickness was moderately increased. Moderately increased left ventricular posterior  wall thickness. There is moderately increased left ventricular hypertrophy.  2. Left ventricular diastolic parameters are consistent with Grade I diastolic dysfunction (impaired relaxation).  3. The left ventricle demonstrates global hypokinesis.  4. Global right ventricle has mildly reduced systolic function.The right ventricular size is normal. No increase in right ventricular wall thickness.  5. Left atrial size was normal.  6. Right atrial size was normal.  7. The mitral valve is normal in structure. No evidence of mitral valve regurgitation. No evidence of mitral stenosis.  8. The tricuspid valve is normal in structure. Tricuspid valve regurgitation is not demonstrated.  9. The aortic valve is tricuspid. Aortic valve regurgitation is not visualized. No evidence of aortic valve sclerosis or stenosis. 10. The pulmonic valve was normal in structure. Pulmonic valve regurgitation is not visualized. 11. TR signal is inadequate for assessing pulmonary artery systolic pressure. 12. The inferior vena cava is normal in size with greater than 50% respiratory variability, suggesting right atrial pressure of 3 mmHg. 13. The average left ventricular global longitudinal strain is -9.2 %.   ASSESSMENT AND PLAN:  1.  Chronic mixed CHF: Most recent EF of 30% to 35%.  He is tolerating torsemide 20 mg daily.  He has lost only 4 pounds with increased dose of torsemide temporarily and then new dose of 20 mg daily.  He was taken off of Lasix.  He continues on bisoprolol 5 mg twice daily for heart rate is not well controlled.  I think it is a combination of caffeine, nicotine, and use of steroid inhalers.  I am going  to check a BMET today, would like to start him on spironolactone if kidney function allows.  He will continue on Entresto as above, with review of BMET when available.  I have instructed him on a low-sodium diet.  I have given him a copy of the salty 6, which includes burritos and cured meats.  He  is strongly advised to eliminate salt from his diet so that he will not have continued decompensation of CHF.  He did want to bargain with me concerning what he could eat more and he should not.  I have been firm concerning his salt intake to prevent hospitalization or worsening heart failure.  If he does not improve may need to send him to Advanced Heart Failure team.  They may need to reevaluate him and consider him for ICD for VT prophylaxis.  He denies any rapid heart rhythms or irregular rhythms at this time.  2.  COPD: He is due to see Dr. Lamonte Sakai tomorrow for follow-up.  He still has some wheezing and coughing although I believe it is improved since being seen last 1 week ago.  Will defer to pulmonology for recommendations.  3.  Diabetes: Continues on Janumet.  Will need to follow PCP for ongoing management.  4.  Hypercholesterolemia: Continue statin therapy.  Will need to follow-up with fasting labs if not completed by PCP.  5.  Ongoing tobacco abuse: He continues to smoke up to 5 cigarettes a day.  He has a new "quit date" for December 30.  He has nicotine patches at home which she wants to start using again.  6.  Skin discoloration: He is asking her to have a dermatologist evaluate the dark spots on either side of his eyes and cheeks which have come up over the last few months.  He will be referred to [facility] dermatology for their recommendations.  Current medicines are reviewed at length with the patient today.    Labs/ tests ordered today include: BMET  Phill Myron. West Pugh, ANP, AACC   12/30/2018 3:12 PM    [address] Office [phone]  Fax [phone]  Notice: This dictation was prepared with Dragon dictation along with smaller phrase technology. Any transcriptional errors that result from this process are unintentional and may not be corrected upon review.

## 2018-12-26 ENCOUNTER — Ambulatory Visit (HOSPITAL_COMMUNITY): Payer: Medicare HMO | Attending: Cardiovascular Disease

## 2018-12-26 ENCOUNTER — Other Ambulatory Visit: Payer: Self-pay

## 2018-12-26 DIAGNOSIS — I5023 Acute on chronic systolic (congestive) heart failure: Secondary | ICD-10-CM | POA: Insufficient documentation

## 2018-12-26 DIAGNOSIS — I10 Essential (primary) hypertension: Secondary | ICD-10-CM | POA: Diagnosis not present

## 2018-12-26 DIAGNOSIS — J449 Chronic obstructive pulmonary disease, unspecified: Secondary | ICD-10-CM

## 2018-12-26 DIAGNOSIS — I428 Other cardiomyopathies: Secondary | ICD-10-CM | POA: Diagnosis not present

## 2018-12-26 NOTE — Telephone Encounter (Signed)
Nothing further needed at this time. 

## 2018-12-30 ENCOUNTER — Ambulatory Visit (INDEPENDENT_AMBULATORY_CARE_PROVIDER_SITE_OTHER): Payer: Medicare HMO | Admitting: Adult Health

## 2018-12-30 ENCOUNTER — Other Ambulatory Visit: Payer: Self-pay

## 2018-12-30 ENCOUNTER — Encounter: Payer: Self-pay | Admitting: Adult Health

## 2018-12-30 VITALS — BP 102/76 | HR 94 | Temp 97.4°F | Ht 68.0 in | Wt 222.6 lb

## 2018-12-30 DIAGNOSIS — R21 Rash and other nonspecific skin eruption: Secondary | ICD-10-CM

## 2018-12-30 DIAGNOSIS — Z79899 Other long term (current) drug therapy: Secondary | ICD-10-CM | POA: Diagnosis not present

## 2018-12-30 NOTE — Patient Instructions (Addendum)
Medication Instructions:  Continue current medications  *If you need a refill on your cardiac medications before your next appointment, please call your pharmacy*  Lab Work: BMP Today  If you have labs (blood work) drawn today and your tests are completely normal, you will receive your results only by: Marland Kitchen MyChart Message (if you have MyChart) OR . A paper copy in the mail If you have any lab test that is abnormal or we need to change your treatment, we will call you to review the results.  Testing/Procedures: None Ordered  Follow-Up: At [facility], you and your health needs are our priority.  As part of our continuing mission to provide you with exceptional heart care, we have created designated Provider Care Teams.  These Care Teams include your primary Cardiologist (physician) and Advanced Practice Providers (APPs -  Physician Assistants and Nurse Practitioners) who all work together to provide you with the care you need, when you need it.  Your next appointment:   1 month(s) January 7th @ 8:30 am  The format for your next appointment:   Virtual Visit   Provider:   Jory Sims, DNP, ANP  Other Instructions

## 2018-12-31 ENCOUNTER — Other Ambulatory Visit: Payer: Self-pay

## 2018-12-31 ENCOUNTER — Ambulatory Visit (INDEPENDENT_AMBULATORY_CARE_PROVIDER_SITE_OTHER): Payer: Medicare HMO | Admitting: Emergency Medicine

## 2018-12-31 ENCOUNTER — Encounter: Payer: Self-pay | Admitting: Emergency Medicine

## 2018-12-31 VITALS — BP 115/68 | HR 79 | Temp 97.0°F | Ht 68.0 in | Wt 223.4 lb

## 2018-12-31 DIAGNOSIS — J449 Chronic obstructive pulmonary disease, unspecified: Secondary | ICD-10-CM

## 2018-12-31 LAB — BASIC METABOLIC PANEL
BUN/Creatinine Ratio: 11 (ref 9–20)
BUN: 13 mg/dL (ref 6–24)
CO2: 25 mmol/L (ref 20–29)
Calcium: 9.6 mg/dL (ref 8.7–10.2)
Chloride: 104 mmol/L (ref 96–106)
Creatinine, Ser: 1.15 mg/dL (ref 0.76–1.27)
GFR calc Af Amer: 81 mL/min/{1.73_m2} (ref >59)
GFR calc non Af Amer: 70 mL/min/{1.73_m2} (ref >59)
Glucose: 102 mg/dL — ABNORMAL HIGH (ref 65–99)
Potassium: 4.5 mmol/L (ref 3.5–5.2)
Sodium: 145 mmol/L — ABNORMAL HIGH (ref 134–144)

## 2018-12-31 MED ORDER — PREDNISONE 10 MG PO TABS: ORAL_TABLET | ORAL | 0 refills | Status: DC

## 2018-12-31 MED ORDER — FLUTICASONE PROPIONATE 50 MCG/ACT NA SUSP: 2.0000 | Freq: Every day | NASAL | 12 refills | Status: DC

## 2018-12-31 NOTE — Assessment & Plan Note (Signed)
Briefly discussed cessation with him today.  He has not made any progress in decreasing.  I will need to dedicate more time to this going forward.

## 2018-12-31 NOTE — Patient Outreach (Signed)
[facility]) Care Management  12/31/2018  Scott [name] [birth year] [id number]   Telephone Assessment    Outreach attempt #1 to patient. Spoke with patient. She shares that he went to see lung MD this morning. He reports that he has been having some increased SOB. MD did not think patient needed abx therapy but placed him on tapering dose of prednisone Patient reports tat he is coughing occasionally and it is clear. He reports that he saw cardiologist on yesterday and was advised to continue to limit fluid intake. RN CM reviewed with pt. s/s of worsening condition in regarding to resp and cardiac status and when to seek medical attention. He reports that he is trying to adhere to low salt diet as much as possible. RN CM discussed smoking cessation with patient. He received counseling/education from pharmacist at pulmonologist office. Patient was on track and had quit smoking for about two weeks per his report. However,he states that over the recent holiday he started back smoking. He voices that he is "only smoking 4-5 cigarettes/day" which is an improvement for him. RN CM educated patient on importance of quitting smoking to improve overall health. He denies any RN CM needs or concerns at present.    [facility]N CM Care Plan Problem One     Most Recent Value  Care Plan Problem One  Knowledge deficit related to disease process and mgmt of COPD.  Role Documenting the Problem One  Care Management Telephonic Lolo for Problem One  Active  [facility] Long Term Goal   Patient will have no hospitalizations and exacerbation of symptoms over the next 60 days.  [facility]N Long Term Goal Start Date  11/01/18  Interventions for Problem One Long Term Goal  RN CM assessed for any acute sxs/issues. RN CM reviewed COPD action plan and ways to manage sxs. RN CM confirmed pt knows how and when to contact MD.   [facility] CM Short Term Goal #1   Patient will be able to verbalize COPD action action for  worsening of symptoms over the next 30 days.  [facility]N CM Short Term Goal #1 Start Date  11/01/18  [facility] CM Short Term Goal #1 Met Date  12/31/18    [facility] CM Care Plan Problem Two     Most Recent Value  Care Plan Problem Two  Knowledge deficit related to disease process and mgmt of symptoms related to CHF  Role Documenting the Problem Two  Care Management Telephonic Coordinator  Care Plan for Problem Two  Active  Interventions for Problem Two Long Term Goal   RN CM reviewed and discussed importance and current tracking/monitoring.  [facility]N Long Term Goal  Patient will report consistently monitoring and recording his weight daily for the next 31 days,  [facility]N Long Term Goal Start Date  11/01/18  [facility] CM Short Term Goal #1   Patient will report adherence to low salt diet at least 85% of the time over the next 30 days.  [facility]N CM Short Term Goal #1 Start Date  11/01/18  [facility] CM Short Term Goal #2   Patient will report adherence to med regimen/diuretics 100% of the time over the next 30 days.  [facility] CM Short Term Goal #2 Start Date  11/01/18  [facility] CM Short Term Goal #2 Met Date  12/03/18    [facility] CM Care Plan Problem Three     Most Recent Value  Care Plan Problem Three  Knowledge deficit related to risks of smoking.  Role  Documenting the Problem Three  Care Management Telephonic Coordinator  Care Plan for Problem Three  Active  [facility]N Long Term Goal   Patient will successfully quit smoking and/or decrease daily cigarette intake over the next 60 days.  THN Long Term Goal Start Date  11/05/18  Interventions for Problem Three Long Term Goal  RN CM continued education and reinforcement of smoking cessation to pt.  [facility] CM Short Term Goal #1   Patient will report taking and adherence to smoking cessation meds at least 90% of the time over the next 30 days.  [facility] CM Short Term Goal #1 Start Date  11/05/18  Interventions for Short Term Goal #1  RNCM reviewd smoking cessation education and importance of quitting.   [facility] CM Short  Term Goal #2   Patient will schedule an appt for smoking cessation class over the next 30 days.  [facility]N CM Short Term Goal #2 Start Date  11/05/18  [facility] CM Short Term Goal #2 Met Date  12/31/18  [facility] CM Short Term Goal #3   Patient will report resolution of resp. sxs over the next 30 days  THN  CM Short Term Goal #3 Start Date  12/31/18  Interventions for Short Term Goal #3  RNCM reviewed med regimen and sx mgmt measures. RN CM confirmed pt knows s/s of worsening condition and when to seek medical attention.       Plan: RN CM discussed with patient next outreach within the month of January. Patient gave verbal consent and in agreement with RN CM follow up and  timeframe. Patient aware that they may contact RN CM sooner for any issues or concerns.  [name], RN,BSN,CCM [facility] Telephonic Care Management Coordinator Direct Phone: [phone] Toll Free: [phone] Fax: [phone]

## 2018-12-31 NOTE — Assessment & Plan Note (Signed)
Not currently treated.  I need to get him back on loratadine and fluticasone nasal spray.

## 2018-12-31 NOTE — Patient Instructions (Addendum)
Please continue Advair 1 inhalation twice a day.  Remember to rinse and gargle after using. Combivent 2 puffs or DuoNeb up to every 6 hours if needed for shortness of breath Ventolin 2 puffs up to every 4 hours if needed for shortness of breath, chest tightness, wheezing. Please take prednisone as directed until completely gone.  Continue Prilosec 40 mg daily Please restart flonase 2 sprays each nostril once a day Please start loratadine 10mg  daily It is very important for you to stop smoking.  This is the most significant thing you could do to improve your health. Your pneumonia shot is up-to-date Your flu shot is up to date Follow with Dr Lamonte Sakai in 6 months or sooner if you have any problems

## 2018-12-31 NOTE — Progress Notes (Signed)
Subjective:    Patient ID: Scott [name], male    DOB: [birth year], 58 y.o.   MRN: [id number]  HPI  ROV 10/05/17 --Scott [name] is a 58 year old smoker with a history of severe obstruction by spirometry, COPD with occasional exacerbations, associated exertional hypoxemia.  Unfortunately back to cigarettes after having stopped for many months, smokes 3 cig a day.  He has been managed with Advair discus, notes that it was recently changed to the generic formulation.  He does not feel that this controls his symptoms as well. He is having more dyspnea, has tried using the generic more than BID. He is having cough, clear sputum. Still on combivent qid. Needs refill omeprazole. He uses DuoNeb qid. He had pneumovax on 09/25/17. No overt flares since last visit. He did not wear his O2 today, is using at night.   ROV 04/08/2018 --follow-up visit for severe COPD and associated exertional hypoxemia.  He also has allergic rhinitis and GERD that contribute to upper airway irritability and cough.  He tells me that he quit smoking in December, is using nicotine gum.  He was hospitalized 3/12-3/13, diagnosed with an acute flare of his COPD.  He was treated with IV corticosteroids, bronchodilators with improvement. No known sick contacts.  He is improving, but still has some cough, feels a bit of chest burning. He is clearing white sputum. Having some nasal congestion and drainage. He is using combivent 4x a day, generic Advair. He does not believe that the generic Advair is effective. He is completing pred taper. He is sleeping with his O2, uses prn for SOB during the day.   ROV 12/31/2018 --58 year old man with COPD, continues to smoke.  Severe obstruction by spirometry.  He has associated exertional hypoxemia.  Also with upper airway irritability and cough with contributions from allergic rhinitis, GERD. He also has HTN and chronic systolic and diastolic CHF, followed at [facility]. Current management with generic Advair  and scheduled Combivent 4 times a day. He also has DuoNeb - used this about . Has albuterol HFA, uses about . His last AE was May 2020. He has been experiencing some increased nasal gtt, some associated cough. No purulent sputum. Some increased wheeze over the last few days. Some increased exertional SOB - noticed it when taking out garbage. He has had negative COVID testing x 3 since the pandemic, but states that he was tested for antibodies and they were positive earlier in the year.     No flowsheet data found.  Objective:   Physical Exam  Vitals:   12/31/18 0908 12/31/18 0911  BP: 115/68 115/68  Pulse: 79 79  Temp: (!) 97 F (36.1 C)   TempSrc: Temporal   SpO2: 95% 95%  Weight: 223 lb 6.4 oz (101.3 kg)   Height: 5\' 8"  (1.727 m)    Gen: Pleasant, well-nourished, in no distress  ENT: No lesions,  mouth clear,  oropharynx clear, no postnasal drip,   Neck: No JVD, no stridor  Lungs: mostly clear, some wheeze when he coughs, not with a normal breath  Cardiovascular: RRR, heart sounds normal, no murmur or gallops, trace peripheral edema  Musculoskeletal: No deformities, no cyanosis or clubbing  Neuro: alert, appropriate, non-focal  Skin: Warm     Assessment & Plan:  COPD with acute exacerbation (HCC) Increased cough and he does have some wheeze with cough.  I do not think this is a severe exacerbation but may be the beginning of 1.  He believes he may need  prednisone to prevent progression.  I am okay with this.  I do not think he needs antibiotics since he does not have purulent sputum.  Otherwise we will continue his usual regimen.  Underscore the importance of smoke cessation with him.  Allergic rhinitis Not currently treated.  I need to get him back on loratadine and fluticasone nasal spray.  GERD (gastroesophageal reflux disease) Continue omeprazole  Tobacco use disorder Briefly discussed cessation with him today.  He has not made any progress in decreasing.  I will  need to dedicate more time to this going forward.   Scott Apo, MD, PhD 12/31/2018, 9:28 AM [facility] [phone] or if no answer [phone]

## 2018-12-31 NOTE — Assessment & Plan Note (Signed)
Increased cough and he does have some wheeze with cough.  I do not think this is a severe exacerbation but may be the beginning of 1.  He believes he may need prednisone to prevent progression.  I am okay with this.  I do not think he needs antibiotics since he does not have purulent sputum.  Otherwise we will continue his usual regimen.  Underscore the importance of smoke cessation with him.

## 2018-12-31 NOTE — Assessment & Plan Note (Signed)
Continue omeprazole 

## 2019-01-01 ENCOUNTER — Ambulatory Visit: Payer: Self-pay

## 2019-01-03 ENCOUNTER — Telehealth: Payer: Self-pay | Admitting: Pharmacist

## 2019-01-03 DIAGNOSIS — M542 Cervicalgia: Secondary | ICD-10-CM | POA: Diagnosis not present

## 2019-01-03 DIAGNOSIS — Z79899 Other long term (current) drug therapy: Secondary | ICD-10-CM | POA: Diagnosis not present

## 2019-01-03 NOTE — Telephone Encounter (Signed)
Called and spoke with patient's wife

## 2019-01-03 NOTE — Telephone Encounter (Signed)
Called and spoke with patient's wife on 01/03/2019 at 5:20PM.  Patient was unable to talk at the time and was at work. Patient will be able to speak on 01/06/2019 after 12 PM. Plan to follow up with patient then.

## 2019-01-03 NOTE — Telephone Encounter (Signed)
Called pt on 01/03/2019 at 10:57 AM.  Patient unable to speak at this time and would like to be called back at [place] today (01/03/2019). Will call patient back ~4PM.

## 2019-01-06 ENCOUNTER — Telehealth: Payer: Self-pay | Admitting: Pharmacist

## 2019-01-06 DIAGNOSIS — Z9189 Other specified personal risk factors, not elsewhere classified: Secondary | ICD-10-CM | POA: Diagnosis not present

## 2019-01-06 DIAGNOSIS — J441 Chronic obstructive pulmonary disease with (acute) exacerbation: Secondary | ICD-10-CM | POA: Diagnosis not present

## 2019-01-06 DIAGNOSIS — R05 Cough: Secondary | ICD-10-CM | POA: Diagnosis not present

## 2019-01-06 NOTE — Telephone Encounter (Signed)
error 

## 2019-01-06 NOTE — Telephone Encounter (Signed)
Called pt on 01/06/2019 at 4:10PM.   He states he is "doing bad right now". He fell down the staris during Thanksgiving and had a fight with family members. He states it is a very stressful time for him. On 12/20/2018 he stopped using nicotine patch 14 mg daily, nicotine lozenge 2 mg daily, and bupropion 150 mg daily. He states he "wants to get his life back on track before he quits smoking". He set a new quit date for 01/22/2019. Encouraged patient for being motivated to pursue smoking cessation again. Will follow up with patient in 01/2019 to re-assess how smoking cessation is going.

## 2019-01-07 ENCOUNTER — Other Ambulatory Visit: Payer: Self-pay | Admitting: Family Medicine

## 2019-01-08 DIAGNOSIS — F331 Major depressive disorder, recurrent, moderate: Secondary | ICD-10-CM | POA: Diagnosis not present

## 2019-01-08 DIAGNOSIS — F411 Generalized anxiety disorder: Secondary | ICD-10-CM | POA: Diagnosis not present

## 2019-01-08 NOTE — Telephone Encounter (Signed)
Requested medication (s) are due for refill today:   Yes  Requested medication (s) are on the active medication list:   Yes  Future visit scheduled:   No   Last ordered: 11/21/2018  #30  0 refills.     Courtesy 30 day refill was given on 11/21/2018.     Returned for further disposition by provider.   Requested Prescriptions  Pending Prescriptions Disp Refills   omeprazole (PRILOSEC) 40 MG capsule [Pharmacy Med Name: OMEPRAZOLE 40MG  DR CAP] 30 capsule 0    Sig: TAKE 1 CAPSULE BY MOUTH EVERY DAY      Gastroenterology: Proton Pump Inhibitors Passed - 01/07/2019 10:15 PM      Passed - Valid encounter within last 12 months    Recent Outpatient Visits           6 months ago SOB (shortness of breath)   [facility], Lilia Argue, MD   7 months ago COPD exacerbation [facility])   [facility], Lilia Argue, MD   7 months ago Medication management   [facility], Lilia Argue, MD   8 months ago Oral candidiasis   [facility], Lilia Argue, MD   9 months ago COPD exacerbation [facility])   [facility], Lilia Argue, MD       Future Appointments             In 3 weeks Lendon Colonel, NP [place], [facility]

## 2019-01-29 NOTE — Progress Notes (Signed)
Virtual Visit via Telephone Note   This visit type was conducted due to national recommendations for restrictions regarding the COVID-19 Pandemic (e.g. social distancing) in an effort to limit this patient's exposure and mitigate transmission in our community.  Due to his co-morbid illnesses, this patient is at least at moderate risk for complications without adequate follow up.  This format is felt to be most appropriate for this patient at this time.  The patient did not have access to video technology/had technical difficulties with video requiring transitioning to audio format only (telephone).  All issues noted in this document were discussed and addressed.  No physical exam could be performed with this format.  Please refer to the patient's chart for his  consent to telehealth for [facility].   Date:  01/29/2019   ID:  Scott [name], DOB 09-19-1960, MRN [id number]  Patient Location: Home Provider Location: Home  PCP:  Burnice Logan, PA  Cardiologist:  Rollene Rotunda, MD  Electrophysiologist:  None   Evaluation Performed:  Follow-Up Visit  Chief Complaint:  Follow Up   History of Present Illness:    Scott [name] is a 59 y.o. male we are following for ongoing assessment and management of chronic systolic heart failure, nonischemic cardiomyopathy, most recent cardiac catheterization both right and left revealed no angiographic evidence of CAD, but findings of volume overload.  EF was 30% to 35%.  Other history includes GERD, chronic emphysema, and COPD.  When last seen in the office on 12/30/2018 the patient came for close follow-up after he was found to have volume overload a week earlier.  He had only lost 4 pounds, on torsemide 20 mg daily.  He admitted to continue to eat a high sodium diet.  He was also to follow-up with pulmonologist concerning COPD.  He did see Dr Delton Coombes on 12/31/2018 and was placed on prednisone for his breathing status. He felt that his status did not require  antibiotics. He was again counseled on smoking cessation.   Unable to take the Torsemide due to tinnitus. Now back on furosemide 40 mg daily. The symptoms have resolved. He does not have any issues with fluid overload today and reports that he is avoiding salt. He reports that he is having good urine output with current dose of lasix. Weight is unchanged.    His labs have ben reviewed with Creatinine 1.15.   He is requesting that we call [employer] for "Healthy Meals on Wheels" for approval from them.  [phone] Ext [phone].    The patient does not have symptoms concerning for COVID-19 infection (fever, chills, cough, or new shortness of breath).    Past Medical History:  Diagnosis Date  . 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 COPD (chronic obstructive pulmonary disease) (HCC)   . Diabetes mellitus without complication (HCC)   . GERD (gastroesophageal reflux disease)   . Headache(784.0)   . Pneumonia    hx   Past Surgical History:  Procedure Laterality Date  . ANTERIOR CERVICAL DECOMP/DISCECTOMY FUSION  12/22/2010   Procedure: ANTERIOR CERVICAL DECOMPRESSION/DISCECTOMY FUSION 3 LEVELS;  Surgeon: Cristi Loron;  Location: [facility] NEURO ORS;  Service: Neurosurgery;  Laterality: N/A;  Anterior cervical discectomy with fusion cervical three-four, four-five, and five sixCDF with Interbody Prosthesis, plating, and Bone Graft   . ANTERIOR CERVICAL DECOMP/DISCECTOMY FUSION N/A 10/16/2012   Procedure: CERVICAL SIX-SEVEN ANTERIOR CERVICAL DECOMPRESSION/DISCECTOMY FUSION WITH INTERBODY PROTHESIS PLATING BONEGRAFT WITH /POSSIBLE HARDWARE REMOVAL OLD PLATE;  Surgeon: Cristi Loron, MD;  Location: [facility] NEURO ORS;  Service: Neurosurgery;  Laterality: N/A;  . MULTIPLE TOOTH EXTRACTIONS    . OTHER SURGICAL HISTORY     surgery on cheekbone and head for fall 2000  . 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 RIGHT/LEFT HEART CATH AND CORONARY ANGIOGRAPHY N/A 09/23/2018   Procedure: RIGHT/LEFT HEART CATH AND CORONARY ANGIOGRAPHY and possible PCI/stent;  Surgeon: Kathleene Hazel, MD;  Location: [facility] INVASIVE CV LAB;  Service: Cardiovascular;  Laterality: N/A;  . SPINE SURGERY     2014 and 2016 - Dr Lovell Sheehan  . TONSILLECTOMY       No outpatient medications have been marked as taking for the 01/30/19 encounter (Appointment) with Jodelle Gross, NP.     Allergies:   Penicillins and Cymbalta [duloxetine hcl]   Social History   Tobacco Use  . Smoking status: Current Every Day Smoker    Packs/day: 1.00    Years: 41.00    Pack years: 41.00    Types: Cigarettes    Start date: 01/23/1973  . Smokeless tobacco: Never Used  . Tobacco comment: 8-9 cigs a day   Substance Use Topics  . Alcohol use: Yes    Comment: Beer occasionally  . Drug use: No     Family Hx: The patient's family history includes Asthma in his brother and sister; COPD in his mother; Diabetes in his brother; Emphysema in his mother; Heart disease in his maternal uncle; Hyperlipidemia in his brother and sister; Hypertension in his brother, brother, and sister; Mental illness in his sister.  ROS:   Please see the history of present illness.    All other systems reviewed and are negative.   Prior CV studies:   The following studies were reviewed today:  Echocardiogram 12/26/2018 1. Left ventricular ejection fraction, by visual estimation, is 30 to 35%. The left ventricle has moderate to severely decreased function. Left ventricular septal wall thickness was moderately increased. Moderately increased left ventricular posterior  wall thickness. There is moderately increased left ventricular hypertrophy. 2. Left ventricular diastolic parameters are consistent with Grade I diastolic dysfunction (impaired relaxation). 3. The left ventricle demonstrates global hypokinesis. 4. Global right ventricle  has mildly reduced systolic function.The right ventricular size is normal. No increase in right ventricular wall thickness. 5. Left atrial size was normal. 6. Right atrial size was normal. 7. The mitral valve is normal in structure. No evidence of mitral valve regurgitation. No evidence of mitral stenosis. 8. The tricuspid valve is normal in structure. Tricuspid valve regurgitation is not demonstrated. 9. The aortic valve is tricuspid. Aortic valve regurgitation is not visualized. No evidence of aortic valve sclerosis or stenosis. 10. The pulmonic valve was normal in structure. Pulmonic valve regurgitation is not visualized. 11. TR signal is inadequate for assessing pulmonary artery systolic pressure. 12. The inferior vena cava is normal in size with greater than 50% respiratory variability, suggesting right atrial pressure of 3 mmHg. 13. The average left ventricular global longitudinal strain is -9.2 %.  Labs/Other Tests and Data Reviewed:    EKG:  No ECG reviewed.  Recent Labs: 03/01/2018: TSH 1.110 04/05/2018: Magnesium 2.0 09/20/2018: ALT 23; B Natriuretic Peptide 1,680.8 09/24/2018: Hemoglobin 15.0; Platelets 303 12/30/2018: BUN 13; Creatinine, Ser 1.15; Potassium 4.5; Sodium 145   Recent Lipid Panel Lab Results  Component Value Date/Time  CHOL 161 03/01/2018 09:58 AM   TRIG 140 05/26/2018 12:09 PM   HDL 40 03/01/2018 09:58 AM   CHOLHDL 4.0 03/01/2018 09:58 AM   LDLCALC 89 03/01/2018 09:58 AM    Wt Readings from Last 3 Encounters:  12/31/18 223 lb 6.4 oz (101.3 kg)  12/30/18 222 lb 9.6 oz (101 kg)  12/24/18 226 lb (102.5 kg)     Objective:    Vital Signs:  There were no vitals taken for this visit.   VITAL SIGNS:  reviewed GEN:  no acute distress PSYCH:  normal affect Limited assessment as this is a phone visit.   ASSESSMENT & PLAN:    1. Chronic Systolic CHF: Did not tolerate torsemide due to tinnitus and therefore restarted furosemide 40 mg daily with resolution  of symptoms. He denies symptoms of volume overload and reports that he is avoiding salt.   I did talk to him about referral to EP for ICD as his EF has not improved on current medication regimen. He wants to keep taking medications for now, and improve his diet. I have talked to him about symptoms which would require EP evaluation such as syncope, near syncope, rapid HR or dizziness. He verbalizes understanding.   I have provided him refills on Entresto, Bisoprolol, Furosemide.   I have called and left a message with [employer] at number listed above for his meals.   2. COPD: Followed by Dr. Lamonte Sakai. Now on steroids.   3, Hypertension: BP is optimal for reduced EF. Can consider adding spironolactone if he is not diuresing well or BP begins to rise for optimal medical therapy with NICM or increasing Entresto dose on next visit.    COVID-19 Education: The signs and symptoms of COVID-19 were discussed with the patient and how to seek care for testing (follow up with PCP or arrange E-visit). The importance of social distancing was discussed today.  Time:   Today, I have spent 15 minutes with the patient with telehealth technology discussing the above problems.  Additional time calling [employer] and leaving VM for meals.   Medication Adjustments/Labs and Tests Ordered: Current medicines are reviewed at length with the patient today.  Concerns regarding medicines are outlined above.   Tests Ordered: No orders of the defined types were placed in this encounter.   Medication Changes: No orders of the defined types were placed in this encounter.   Disposition:  Follow up 3 months in person or virtual   Signed, Phill Myron. West Pugh, ANP, AACC  01/29/2019 10:09 AM    [facility]

## 2019-01-30 ENCOUNTER — Encounter: Payer: Self-pay | Admitting: Adult Health

## 2019-01-30 ENCOUNTER — Telehealth: Payer: Self-pay | Admitting: Adult Health

## 2019-01-30 ENCOUNTER — Telehealth: Payer: Self-pay | Admitting: Cardiology

## 2019-01-30 ENCOUNTER — Telehealth (INDEPENDENT_AMBULATORY_CARE_PROVIDER_SITE_OTHER): Payer: Medicare HMO | Admitting: Adult Health

## 2019-01-30 VITALS — Ht 68.0 in | Wt 222.0 lb

## 2019-01-30 DIAGNOSIS — J441 Chronic obstructive pulmonary disease with (acute) exacerbation: Secondary | ICD-10-CM

## 2019-01-30 DIAGNOSIS — F17200 Nicotine dependence, unspecified, uncomplicated: Secondary | ICD-10-CM

## 2019-01-30 DIAGNOSIS — Z818 Family history of other mental and behavioral disorders: Secondary | ICD-10-CM

## 2019-01-30 DIAGNOSIS — I110 Hypertensive heart disease with heart failure: Secondary | ICD-10-CM | POA: Diagnosis not present

## 2019-01-30 DIAGNOSIS — I5022 Chronic systolic (congestive) heart failure: Secondary | ICD-10-CM | POA: Diagnosis not present

## 2019-01-30 DIAGNOSIS — Z8249 Family history of ischemic heart disease and other diseases of the circulatory system: Secondary | ICD-10-CM | POA: Diagnosis not present

## 2019-01-30 DIAGNOSIS — J449 Chronic obstructive pulmonary disease, unspecified: Secondary | ICD-10-CM

## 2019-01-30 DIAGNOSIS — Z833 Family history of diabetes mellitus: Secondary | ICD-10-CM

## 2019-01-30 DIAGNOSIS — Z836 Family history of other diseases of the respiratory system: Secondary | ICD-10-CM

## 2019-01-30 DIAGNOSIS — I10 Essential (primary) hypertension: Secondary | ICD-10-CM

## 2019-01-30 MED ORDER — SACUBITRIL-VALSARTAN 24-26 MG PO TABS: 1.0000 | ORAL_TABLET | Freq: Two times a day (BID) | ORAL | 3 refills | Status: DC

## 2019-01-30 MED ORDER — FUROSEMIDE 40 MG PO TABS: 40.0000 mg | ORAL_TABLET | Freq: Every day | ORAL | 3 refills | Status: DC

## 2019-01-30 MED ORDER — BISOPROLOL FUMARATE 5 MG PO TABS: 5.0000 mg | ORAL_TABLET | Freq: Every day | ORAL | 3 refills | Status: DC

## 2019-01-30 NOTE — Telephone Encounter (Signed)
Spoke with patient today for virtual visit. He is requesting that we call [employer] at [phone] Ext [phone] to have "[employer]" request/approval for this patient to have these meals delivered to his home.   I called the number and left a VM to have them call back. The contact person is "Larita Fife"

## 2019-01-30 NOTE — Telephone Encounter (Signed)
-----   Message from Lindell Spar, RN sent at 01/30/2019  8:34 AM EST ----- Regarding: 3 month appt needed Please schedule 3 month appointment with Dr. Antoine Poche or Samara Deist NP  Thanks

## 2019-01-30 NOTE — Telephone Encounter (Signed)
Spoke with Larita Fife with [employer]. Patient can recieve meals if patient has chronic conditions - which he does - chronic systolic CHF, HTN, COPD.   Larita Fife suggested provider line for clinical intake team @ [phone] Chronic condition [employer] meals - provide chronic conditions Patient can received up to 20 meals annually thru this program  [facility] @ number provided as transfer from [place] did not work  Doctor, hospital with [employer] rep who said Joni Reining is not in-network (?). Explained that this patient gets cardiology care from our practice. Ref number: [id number]  [employer] rep will check is Rollene Rotunda MD is in network. Ref number: [id number] - who is in network. [employer] rep provided # below, which was called.   [employer] Meals program: [phone] Client (patient) will have apply thru the state to apply for [employer] Meals program: [phone]  Patient updated via MyChart message 30 minutes on call

## 2019-01-30 NOTE — Telephone Encounter (Signed)
Left message to have patient call back and schedule 3 month follow-up with Dr. Antoine Poche per staff message.

## 2019-01-30 NOTE — Patient Instructions (Signed)
Medication Instructions:  Your physician recommends that you continue on your current medications as directed. Please refer to the Current Medication list given to you today.  *If you need a refill on your cardiac medications before your next appointment, please call your pharmacy*  Follow-Up: At [facility], you and your health needs are our priority.  As part of our continuing mission to provide you with exceptional heart care, we have created designated Provider Care Teams.  These Care Teams include your primary Cardiologist (physician) and Advanced Practice Providers (APPs -  Physician Assistants and Nurse Practitioners) who all work together to provide you with the care you need, when you need it.  Your next appointment:   3 month(s)  The format for your next appointment:   In Person  Provider:   You may see Rollene Rotunda, MD or one of the following Advanced Practice Providers on your designated Care Team:    Theodore Demark, PA-C  Joni Reining, DNP, ANP  Cadence Fransico Michael, NP   Other Instructions

## 2019-01-31 ENCOUNTER — Telehealth: Payer: Self-pay | Admitting: Pharmacist

## 2019-01-31 ENCOUNTER — Other Ambulatory Visit: Payer: Self-pay

## 2019-01-31 MED ORDER — BISOPROLOL FUMARATE 5 MG PO TABS: 5.0000 mg | ORAL_TABLET | Freq: Every day | ORAL | 3 refills | Status: DC

## 2019-01-31 NOTE — Telephone Encounter (Signed)
Called patient on 01/31/2019 at 11:47 AM.  Patient states he was previously smoking 6-7 cigarettes per day in 12/2018. He has decided to decrease his smoking intake on 01/23/2019. He started using nicotine patch and lozenges on 01/23/2019. Congratulated patient for his efforts! He has not been using bupropion due to confusion about using with nicotine patch/lozenge. He had questions regarding pharmacokinetics of nicotine patch/lozenge - how medication is delivered, does it deliver 14 mg all at once. All questions encouraged and answered successfully.    Age when started using tobacco on a daily basis: 59 years old Preferred cigarette brand: Newport 100s -Does not smoke entire cigarette at once Tobacco use: 1-2 cigarettes Triggers: eating meals, watching "good programs" (football), edible marijuana, smoking marijuana, using the bathroom, fighting with family members, caretaker for grandchildren, wife smokes cigarettes, marital issues, multiple family members died from COVID-19  Current smoking cessation agents: nicotine patch 14 mg daily (started on 01/22/19), nicotine lozenge 2 mg,  Prior smoking cessation agents: bupropion (does not remember) Non-pharmacologic methods: mints, root-beer flavored candy  Motivation to quit: concern for health Goal: smoke 1 cigarette at most per day  Plan:  1.  Start using bupropion (patient was able to verbalize appropriate titration schedule) 2. Continue nicotine patch 14 mg daily and nicotine lozenge 2 mg daily.  3. Continue non-pharmacologic methods  Follow up: 02/13/2018

## 2019-02-04 ENCOUNTER — Other Ambulatory Visit: Payer: Self-pay

## 2019-02-04 NOTE — Patient Outreach (Signed)
[facility]) Care Management  02/04/2019  Mohammad Granade 29-May-1960 [id number]   Telephone Assessment    Outreach attempt #1 to patient. No answer. RN CM left HIPAA compliant voicemail message along with contact info.      Plan: RN CM will make outreach attempt to patient within the month of February if no return call from patient.    Antionette Fairy, RN,BSN,CCM [facility] Care Management Telephonic Care Management Coordinator Direct Phone: [phone] Toll Free: [phone] Fax: [phone]

## 2019-02-14 ENCOUNTER — Telehealth: Payer: Self-pay | Admitting: Pharmacist

## 2019-02-14 NOTE — Telephone Encounter (Signed)
Called patient on 02/14/2019 at 4:54 PM.  Patient is still smoking 2 cigarettes per day. He states he has started bupropion 300 mg XL daily and medication has helped decrease urges to smoke. Congratulated patient for his efforts to not increase smoking! He states he has been drinking water more (4-5 water bottles), which has helped with nicotine cravings.    Age when started using tobacco on a daily basis:59 years old Preferred cigarette brand:Newport 100s -Does not smoke entire cigarette at once Tobacco use:2 cigarettes Triggers:eating meals, watching "good programs" (football), edible marijuana, smoking marijuana, using the bathroom, fighting with family members, caretaker for grandchildren, wife smokes cigarettes, marital issues, multiple family members died from COVID-19  Current smoking cessation agents: nicotine patch 14 mg daily (started on 01/22/19), nicotine lozenge 2 mg, bupropion 300 mg XL daily Prior smoking cessation agents:bupropion (does not remember) Non-pharmacologic methods:mints, root-beer flavored candy, drinking more water  Motivation to quit: concern for health Goal: smoke 1 cigarette at most per day  Plan:  1. Try brushing teeth and using mouth wash after meals instead of smoking cigarette 1. Continue bupropion 300 mg XL daily, nicotine patch 14 mg daily and nicotine lozenge 2 mg daily.  2. Continue non-pharmacologic methods  Follow up: 02/27/2018

## 2019-03-04 ENCOUNTER — Other Ambulatory Visit: Payer: Self-pay

## 2019-03-04 NOTE — Patient Outreach (Signed)
[facility]) Care Management  03/04/2019  Cassidy Tabet 1960/05/15 [id number]   Telephone Assessment    Outreach attempt #1 to patient.  No answer at present. RN CM left HIPAA compliant voicemail message along with contact info.     Plan: RN CM will make outreach attempt to patient within the month of March if no return call from patient.    Antionette Fairy, RN,BSN,CCM [facility] Care Management Telephonic Care Management Coordinator Direct Phone: [phone] Toll Free: [phone] Fax: [phone]

## 2019-03-06 ENCOUNTER — Ambulatory Visit: Payer: Self-pay

## 2019-03-07 ENCOUNTER — Telehealth: Payer: Self-pay | Admitting: Pharmacist

## 2019-03-07 NOTE — Telephone Encounter (Signed)
Called patient on 03/07/2019 at 1:49 PM   Patient is on his way home driving from [place] and is unable to talk at this time. Will call patient back in 1 week.   Zachery Conch, PharmD PGY2 Ambulatory Care Pharmacy Resident

## 2019-03-13 ENCOUNTER — Telehealth: Payer: Self-pay | Admitting: Pharmacist

## 2019-03-13 NOTE — Telephone Encounter (Signed)
Called patient on 03/13/2019 at 1:28 PM   Patient unable to talk at the moment and would prefer to be contacted in 1 week. Will follow up at that time.  Thank you for involving pharmacy to assist in providing this patient's care.   Zachery Conch, PharmD PGY2 Ambulatory Care Pharmacy Resident

## 2019-03-21 ENCOUNTER — Telehealth: Payer: Self-pay | Admitting: Pharmacist

## 2019-03-21 NOTE — Telephone Encounter (Signed)
You for working with the patient.  Agree with referral to [place] quit line.Thanks for all you do.Arlys John

## 2019-03-21 NOTE — Telephone Encounter (Signed)
Called patient on 03/21/2019 at 1:16 PM.  Patient has relapsed from smoking 2 cigarettes per day to 5 cigarettes per day after going to [place] for a death in the family.   Age when started using tobacco on a daily basis:59 years old Preferred cigarette brand:Newport 100s -Does not smoke entire cigarette at once Tobacco use:5 cigarettes Triggers:eating meals, watching "good programs" (football), edible marijuana, smoking marijuana, using the bathroom, fighting with family members, caretaker for grandchildren, wife smokes cigarettes, marital issues, multiple family members died from COVID-19  Current smoking cessation agents:nicotine patch 14 mg daily (started on 01/22/19), nicotine lozenge 2 mg,bupropion 300 mg XL daily Prior smoking cessation agents:bupropion (does not remember) Non-pharmacologic methods:mints, root-beer flavored candy, drinking more water (4-5 water bottles per day)  Motivation to quit:concern for health  Plan: 1. Have been following patient for multiple months and he has relapsed. Refer patient to [facility] Quitline for different approach on smoking cessation counseling. Advised patient to follow up with me if he would like after trying [place] [employer]. Patient verbalized understanding.   Follow up:As needed

## 2019-04-02 ENCOUNTER — Other Ambulatory Visit: Payer: Self-pay

## 2019-04-02 NOTE — Patient Outreach (Signed)
[facility]) Care Management  04/02/2019  Chun Sellen 1960-03-11 [id number]   Telephone Assessment   Outreach attempt to patient. RN CM finally able to reach patient after multiple months of being unsuccessful. Patient shares that he went up [place] to visit him mom for about five weeks and just returned a few days ago. He reports that he has been dealing with some life issues/stressors. He admits to not consistently taking Nicotine patch and gum. He reports that he is smoking about 3-4 cigarettes/day. He had been getting smoking cessation counseling via pharmacist at [facility] office. He voices sessions were helpful. However, admits that this is something he has to do "on his own and be willing to do it." RN CM continued education regarding mgmt of lung disease. Patient reports that he does become SOB at times. He using resp meds to help.He voices he is due to see lung MD but unsure of when. Patient inquiring about how to obtain COVID-19 vaccine. RN CM provided education to patient regarding vaccine and info on how to schedule an appt. He will follow up. He denies any other RN CM needs or concerns at this time.   Medications Reviewed Today    Reviewed by Hayden Pedro, RN (Registered Nurse) on 04/02/19 at 1118  Med List Status: <None>  Medication Order Taking? Sig Documenting Provider Last Dose Status Informant  ADVAIR DISKUS 250-50 MCG/DOSE AEPB [id number] No INHALE 1 PUFF INTO THE LUNGS TWICE DAILY Byrum, Rose Fillers, MD Taking Active   albuterol (VENTOLIN HFA) 108 (90 Base) MCG/ACT inhaler [id number] No Inhale 1-2 puffs into the lungs every 6 (six) hours as needed for wheezing or shortness of breath. Lauraine Rinne, NP Taking Active   aspirin 325 MG EC tablet [id number] No Take 81 mg by mouth daily. [provider] Taking Active Self           Med Note Altamese Dilling Nov 25, 2018  9:26 AM) Takes 325 mg 6 times per day  bisoprolol (ZEBETA) 5 MG tablet  [id number]  Take 1 tablet (5 mg total) by mouth daily. Lendon Colonel, NP  Active   buPROPion [facility] SR) 150 MG 12 hr tablet [id number] No Take 150 mg by mouth 2 (two) times daily. Start using 150 mg SR daily for 3 days then increase to 150 mg SR twice daily (8 hours apart). Started using on 11/16 and expected to titrate on 11/19. [provider] Taking Active   COMBIVENT RESPIMAT 20-100 MCG/ACT AERS respimat [id number] No INHALE 1 PUFF BY MOUTH INTO THE LUNGS FOUR TIMES A DAY Byrum, Rose Fillers, MD Taking Active   diazepam (VALIUM) 5 MG tablet [id number] No Take 5 mg by mouth 2 (two) times daily as needed for anxiety. [provider] Taking Active Self  fluticasone (FLONASE) 50 MCG/ACT nasal spray [id number] No Place 2 sprays into both nostrils daily. Collene Gobble, MD Taking Active   furosemide (LASIX) 40 MG tablet [id number]  Take 1 tablet (40 mg total) by mouth daily. Lendon Colonel, NP  Active   gabapentin (NEURONTIN) 300 MG capsule [id number] No TAKE 1 CAPSULE BY MOUTH 3 TIMES DAILY Rutherford Guys, MD Taking Active   ipratropium-albuterol (DUONEB) 0.5-2.5 (3) MG/3ML SOLN [id number] No INHALE THE CONTENTS OF 1 VIAL VIA NEBULIZER EVERY 6 HOURS AS DIRECTED FOR SHORTNESS OF BREATH OR WHEEZING  Patient taking differently: Inhale 3 mLs into the lungs 4 (four) times daily. INHALE THE CONTENTS OF 1  VIAL VIA NEBULIZER EVERY 6 HOURS AS DIRECTED FOR SHORTNESS OF BREATH OR WHEEZING   Byrum, Rose Fillers, MD Taking Active Self           Med Note Altamese Dilling Nov 25, 2018  9:39 AM) Switches off between using combivent and duonebs  JANUMET 50-500 MG tablet [id number] No TAKE 1 TABLET BY MOUTH ONCE A DAY Rutherford Guys, MD Taking Active Self  NARCAN 4 MG/0.1ML LIQD nasal spray kit [id number] No  [provider] Taking Active Self  nicotine (NICODERM CQ - DOSED IN MG/24 HOURS) 14 mg/24hr patch [id number] No PLACE 1 PATCH ONTO THE SKIN DAILY FOR 28 DAYS  Patient taking  differently: Pt reported not consistently taking   Lauraine Rinne, NP Taking Active Self  nicotine polacrilex (NICOTINE MINI) 2 MG lozenge [id number] No Take 1 lozenge (2 mg total) by mouth as needed for smoking cessation. (maximum: 5 lozenges every 6 hours; 20 lozenges/day).  Patient taking differently: Take 2 mg by mouth as needed for smoking cessation. (maximum: 5 lozenges every 6 hours; 20 lozenges/day). Pt reported not consistently taking   Lauraine Rinne, NP Taking Active Self  omeprazole (PRILOSEC) 40 MG capsule [id number] No TAKE 1 CAPSULE BY MOUTH EVERY DAY Rutherford Guys, MD Taking Active   oxyCODONE-acetaminophen (PERCOCET) 10-325 MG tablet [id number] No Take 1 tablet by mouth every 4 (four) hours. Desiree Hane, MD Taking Active Self           Med Note Altamese Dilling Nov 25, 2018  9:45 AM) Taking daily every 6 hours  predniSONE (DELTASONE) 10 MG tablet [id number] No Take 4 tablets for 3 days, 3 for 3 days, 2 for 3 days, 1 for 3 days then Stop. Collene Gobble, MD Taking Active   rosuvastatin (CRESTOR) 10 MG tablet [id number] No Take 10 mg by mouth daily. Fredrich Romans, PA Taking Active Self  sacubitril-valsartan (ENTRESTO) 24-26 MG [id number]  Take 1 tablet by mouth 2 (two) times daily. Lendon Colonel, NP  Active   Vitamin D, Ergocalciferol, (DRISDOL) 1.25 MG (50000 UT) CAPS capsule [id number] No TAKE ONE CAPSULE BY MOUTH ONCE WEEKLY Rutherford Guys, MD Taking Active           [facility] CM Care Plan Problem One     Most Recent Value  Care Plan Problem One  Knowledge deficit related to disease process and mgmt of COPD.  Role Documenting the Problem One  Care Management Telephonic Coordinator  Care Plan for Problem One  Active  [facility] Long Term Goal   Patient will report obtianing COVID-19 vaccine over the next 90 days.  [facility] Long Term Goal Start Date  04/02/19  [facility] Long Term Goal Met Date  04/02/19  Interventions for Problem One Long Term Goal  RNCM eduated pt on beenfits of  vaccine and provided instructions/info on how to obtain vaccine appt.    [facility] CM Care Plan Problem Two     Most Recent Value  Care Plan Problem Two  Knowledge deficit related to disease process and mgmt of symptoms related to CHF  Role Documenting the Problem Two  Care Management Telephonic Coordinator  Care Plan for Problem Two  Active  [facility] Long Term Goal  Patient will report consistently monitoring and recording his weight daily for the next 31 days,  [facility] Long Term Goal Start Date  11/01/18  [facility] Long Term Goal Met Date  04/02/19  [facility] CM Short Term  Goal #1   Patient will report adherence to low salt diet at least 85% of the time over the next 30 days.  [facility] CM Short Term Goal #1 Start Date  11/01/18  [facility] CM Short Term Goal #1 Met Date   04/02/19  [facility] CM Short Term Goal #2   Patient will report adherence to med regimen/diuretics 100% of the time over the next 30 days.  [facility] CM Short Term Goal #2 Start Date  11/01/18  [facility] CM Short Term Goal #2 Met Date  12/03/18    [facility] CM Care Plan Problem Three     Most Recent Value  Care Plan Problem Three  Knowledge deficit related to risks of smoking.  Role Documenting the Problem Three  Care Management Telephonic Coordinator  Care Plan for Problem Three  Active  [facility] Long Term Goal   Patient will successfully quit smoking and/or decrease daily cigarette intake over the next 60 days.  [facility] Long Term Goal Start Date  11/05/18  Interventions for Problem Three Long Term Goal  RN CM assessed and educated pt on smoking cessation, discussed importance of med adherence and ways to manage stressors  [facility] CM Short Term Goal #1   Patient will report taking and adherence to smoking cessation meds at least 90% of the time over the next 30 days.  [facility] CM Short Term Goal #1 Start Date  11/05/18  Interventions for Short Term Goal #1  RN CM assessed smoking habits,routines, stressors and factors  [facility] CM Short Term Goal #2   Patient will schedule an appt for smoking cessation  class over the next 30 days.  [facility] CM Short Term Goal #2 Start Date  11/05/18  [facility] CM Short Term Goal #2 Met Date  12/31/18  [facility] CM Short Term Goal #3   Patient will report resolution of resp. sxs over the next 30 days  [facility]  CM Short Term Goal #3 Start Date  12/31/18  [facility] CM Short Term Goal #3 Met Date  04/02/19      Plan: RN CM discussed with patient next outreach within the month of April. Patient gave verbal consent and in agreement with RN CM follow up and timeframe. Patient aware that they may contact RN CM sooner for any issues or concerns.  RN CM will send quarterly update to PCP.  [name], RN,BSN,CCM [place] [facility] Telephonic Care Management Coordinator Direct Phone: [phone] Toll Free: [phone] Fax: [phone]

## 2019-04-04 ENCOUNTER — Ambulatory Visit: Payer: Self-pay

## 2019-04-24 ENCOUNTER — Ambulatory Visit: Payer: Self-pay

## 2019-04-25 ENCOUNTER — Ambulatory Visit: Payer: Medicare HMO | Attending: Internal Medicine

## 2019-04-25 DIAGNOSIS — Z23 Encounter for immunization: Secondary | ICD-10-CM

## 2019-04-25 NOTE — Progress Notes (Signed)
   Covid-19 Vaccination Clinic  Name:  Scott [name]    MRN: [id number] DOB: October 04, 1960  04/25/2019  [name] was observed post Covid-19 immunization for 15 minutes without incident. He was provided with Vaccine Information Sheet and instruction to access the V-Safe system.   [name] was instructed to call 911 with any severe reactions post vaccine: Marland Kitchen Difficulty breathing  . Swelling of face and throat  . A fast heartbeat  . A bad rash all over body  . Dizziness and weakness   Immunizations Administered    Name Date Dose VIS Date Route   Pfizer COVID-19 Vaccine 04/25/2019  8:21 AM 0.3 mL 01/03/2019 Intramuscular   Manufacturer: [employer], [employer]   Lot: [id number]   NDC: [address]-5

## 2019-05-05 ENCOUNTER — Other Ambulatory Visit: Payer: Self-pay

## 2019-05-05 NOTE — Patient Outreach (Signed)
[facility]) Care Management  05/05/2019  Ac Colan 08-Apr-1960 [id number]   Telephone Assessment    Outreach attempt # 1 to patient. Spoke with patient who reported he was doing fine but driving at present and unable to talk.      Plan: RN CM will make outreach attempt to patient within the month of May.   Antionette Fairy, RN,BSN,CCM [facility] Care Management Telephonic Care Management Coordinator Direct Phone: [phone] Toll Free: [phone] Fax: [phone]

## 2019-05-08 ENCOUNTER — Ambulatory Visit: Payer: Self-pay

## 2019-05-19 ENCOUNTER — Ambulatory Visit: Payer: Medicare HMO | Attending: Internal Medicine

## 2019-05-19 ENCOUNTER — Other Ambulatory Visit: Payer: Self-pay | Admitting: *Deleted

## 2019-05-19 DIAGNOSIS — Z23 Encounter for immunization: Secondary | ICD-10-CM

## 2019-05-19 MED ORDER — IPRATROPIUM-ALBUTEROL 0.5-2.5 (3) MG/3ML IN SOLN: RESPIRATORY_TRACT | 0 refills | Status: DC

## 2019-05-19 NOTE — Progress Notes (Signed)
   Covid-19 Vaccination Clinic  Name:  Scott [name]    MRN: [id number] DOB: 30-Dec-1960  05/19/2019  Mr. [name] was observed post Covid-19 immunization for 15 minutes without incident. He was provided with Vaccine Information Sheet and instruction to access the V-Safe system.   Mr. [name] was instructed to call 911 with any severe reactions post vaccine: Marland Kitchen Difficulty breathing  . Swelling of face and throat  . A fast heartbeat  . A bad rash all over body  . Dizziness and weakness   Immunizations Administered    Name Date Dose VIS Date Route   Pfizer COVID-19 Vaccine 05/19/2019 11:36 AM 0.3 mL 03/19/2018 Intramuscular   Manufacturer: [employer], [employer]   Lot: [id number]   NDC: [address]-1

## 2019-06-11 ENCOUNTER — Other Ambulatory Visit: Payer: Self-pay

## 2019-06-11 NOTE — Patient Outreach (Signed)
[facility]) Care Management  06/11/2019  Scott [name] 23-Mar-1960 [id number]   Telephone Assessment   Outreach attempt to patient. Spoke with patient who reported he was driving and unable to talk at present.     Plan: RN CM will make outreach attempt to patient within the month of June if no return call from pt.   Antionette Fairy, RN,BSN,CCM [facility] Care Management Telephonic Care Management Coordinator Direct Phone: [phone] Toll Free: [phone] Fax: [phone]

## 2019-06-12 ENCOUNTER — Ambulatory Visit: Payer: Self-pay

## 2019-06-13 ENCOUNTER — Telehealth: Payer: Self-pay

## 2019-06-13 NOTE — Telephone Encounter (Signed)
Medication Refill - Medication: Vitamin D, Ergocalciferol, (DRISDOL) 1.25 MG (50000 UT) CAPS capsule   JANUMET 50-500 MG tablet    Has the patient contacted their pharmacy? Yes.   (Agent: If no, request that the patient contact the pharmacy for the refill.) (Agent: If yes, when and what did the pharmacy advise?)  Preferred Pharmacy (with phone number or street name): [facility], [place] - [address] AT [address]  [address], [place] [address]  Phone:  [phone] Fax:  [phone]   Agent: Please be advised that RX refills may take up to 3 business days. We ask that you follow-up with your pharmacy.

## 2019-06-13 NOTE — Telephone Encounter (Signed)
Pt would need an appointment to get this medication. We have not check vit D labs since 2019 and he last had his vit D filled 12/18/18 for 12 weeks.   He should be taking OTC vit D at this point, however we will need to see more lab work and appointment to decide whether or not he needs more refills.

## 2019-06-16 NOTE — Telephone Encounter (Signed)
LVMTCB and schedule appt

## 2019-06-24 ENCOUNTER — Other Ambulatory Visit: Payer: Self-pay | Admitting: *Deleted

## 2019-06-24 DIAGNOSIS — F17210 Nicotine dependence, cigarettes, uncomplicated: Secondary | ICD-10-CM

## 2019-06-24 DIAGNOSIS — Z87891 Personal history of nicotine dependence: Secondary | ICD-10-CM

## 2019-06-29 ENCOUNTER — Other Ambulatory Visit: Payer: Self-pay

## 2019-06-29 ENCOUNTER — Emergency Department (HOSPITAL_COMMUNITY): Payer: Medicare HMO

## 2019-06-29 ENCOUNTER — Observation Stay (HOSPITAL_COMMUNITY)
Admission: EM | Admit: 2019-06-29 | Discharge: 2019-06-30 | Disposition: A | Discharge status: Home / Self Care | Payer: Medicare HMO | Attending: Pulmonary Disease | Admitting: Pulmonary Disease

## 2019-06-29 ENCOUNTER — Encounter (HOSPITAL_COMMUNITY): Payer: Self-pay | Admitting: Pharmacy Technician

## 2019-06-29 DIAGNOSIS — N179 Acute kidney failure, unspecified: Secondary | ICD-10-CM

## 2019-06-29 DIAGNOSIS — I110 Hypertensive heart disease with heart failure: Secondary | ICD-10-CM | POA: Insufficient documentation

## 2019-06-29 DIAGNOSIS — Z79899 Other long term (current) drug therapy: Secondary | ICD-10-CM | POA: Diagnosis not present

## 2019-06-29 DIAGNOSIS — I5023 Acute on chronic systolic (congestive) heart failure: Secondary | ICD-10-CM | POA: Insufficient documentation

## 2019-06-29 DIAGNOSIS — J189 Pneumonia, unspecified organism: Secondary | ICD-10-CM

## 2019-06-29 DIAGNOSIS — J431 Panlobular emphysema: Secondary | ICD-10-CM

## 2019-06-29 DIAGNOSIS — I10 Essential (primary) hypertension: Secondary | ICD-10-CM

## 2019-06-29 DIAGNOSIS — F17210 Nicotine dependence, cigarettes, uncomplicated: Secondary | ICD-10-CM | POA: Insufficient documentation

## 2019-06-29 DIAGNOSIS — R000 Tachycardia, unspecified: Secondary | ICD-10-CM | POA: Insufficient documentation

## 2019-06-29 DIAGNOSIS — J9611 Chronic respiratory failure with hypoxia: Secondary | ICD-10-CM | POA: Diagnosis not present

## 2019-06-29 DIAGNOSIS — K219 Gastro-esophageal reflux disease without esophagitis: Secondary | ICD-10-CM | POA: Diagnosis not present

## 2019-06-29 DIAGNOSIS — I959 Hypotension, unspecified: Secondary | ICD-10-CM | POA: Diagnosis not present

## 2019-06-29 DIAGNOSIS — I480 Paroxysmal atrial fibrillation: Secondary | ICD-10-CM | POA: Insufficient documentation

## 2019-06-29 DIAGNOSIS — Z7984 Long term (current) use of oral hypoglycemic drugs: Secondary | ICD-10-CM | POA: Diagnosis not present

## 2019-06-29 DIAGNOSIS — R918 Other nonspecific abnormal finding of lung field: Secondary | ICD-10-CM | POA: Diagnosis not present

## 2019-06-29 DIAGNOSIS — E785 Hyperlipidemia, unspecified: Secondary | ICD-10-CM | POA: Diagnosis not present

## 2019-06-29 DIAGNOSIS — I428 Other cardiomyopathies: Secondary | ICD-10-CM | POA: Insufficient documentation

## 2019-06-29 DIAGNOSIS — R579 Shock, unspecified: Secondary | ICD-10-CM | POA: Diagnosis present

## 2019-06-29 DIAGNOSIS — R778 Other specified abnormalities of plasma proteins: Secondary | ICD-10-CM | POA: Insufficient documentation

## 2019-06-29 DIAGNOSIS — Z7982 Long term (current) use of aspirin: Secondary | ICD-10-CM | POA: Insufficient documentation

## 2019-06-29 DIAGNOSIS — E119 Type 2 diabetes mellitus without complications: Secondary | ICD-10-CM | POA: Diagnosis not present

## 2019-06-29 DIAGNOSIS — I4891 Unspecified atrial fibrillation: Secondary | ICD-10-CM

## 2019-06-29 DIAGNOSIS — R55 Syncope and collapse: Secondary | ICD-10-CM | POA: Diagnosis not present

## 2019-06-29 DIAGNOSIS — G894 Chronic pain syndrome: Secondary | ICD-10-CM | POA: Insufficient documentation

## 2019-06-29 DIAGNOSIS — J441 Chronic obstructive pulmonary disease with (acute) exacerbation: Secondary | ICD-10-CM | POA: Diagnosis not present

## 2019-06-29 DIAGNOSIS — Z20822 Contact with and (suspected) exposure to covid-19: Secondary | ICD-10-CM | POA: Diagnosis not present

## 2019-06-29 DIAGNOSIS — M1990 Unspecified osteoarthritis, unspecified site: Secondary | ICD-10-CM | POA: Insufficient documentation

## 2019-06-29 LAB — TROPONIN I (HIGH SENSITIVITY)
Troponin I (High Sensitivity): 42 ng/L — ABNORMAL HIGH (ref <18)
Troponin I (High Sensitivity): 312 ng/L (ref <18)
Troponin I (High Sensitivity): 370 ng/L (ref <18)
Troponin I (High Sensitivity): 388 ng/L (ref <18)

## 2019-06-29 LAB — GLUCOSE, CAPILLARY: Glucose-Capillary: 146 mg/dL — ABNORMAL HIGH (ref 70–99)

## 2019-06-29 LAB — I-STAT CHEM 8, ED
BUN: 16 mg/dL (ref 6–20)
Calcium, Ion: 0.97 mmol/L — ABNORMAL LOW (ref 1.15–1.40)
Chloride: 97 mmol/L — ABNORMAL LOW (ref 98–111)
Creatinine, Ser: 1.9 mg/dL — ABNORMAL HIGH (ref 0.61–1.24)
Glucose, Bld: 105 mg/dL — ABNORMAL HIGH (ref 70–99)
HCT: 55 % — ABNORMAL HIGH (ref 39.0–52.0)
Hemoglobin: 18.7 g/dL — ABNORMAL HIGH (ref 13.0–17.0)
Potassium: 3.5 mmol/L (ref 3.5–5.1)
Sodium: 139 mmol/L (ref 135–145)
TCO2: 28 mmol/L (ref 22–32)

## 2019-06-29 LAB — COMPREHENSIVE METABOLIC PANEL
ALT: 15 U/L (ref 0–44)
AST: 24 U/L (ref 15–41)
Albumin: 3.8 g/dL (ref 3.5–5.0)
Alkaline Phosphatase: 101 U/L (ref 38–126)
Anion gap: 16 — ABNORMAL HIGH (ref 5–15)
BUN: 12 mg/dL (ref 6–20)
CO2: 26 mmol/L (ref 22–32)
Calcium: 9.1 mg/dL (ref 8.9–10.3)
Chloride: 99 mmol/L (ref 98–111)
Creatinine, Ser: 1.82 mg/dL — ABNORMAL HIGH (ref 0.61–1.24)
GFR calc Af Amer: 46 mL/min — ABNORMAL LOW (ref >60)
GFR calc non Af Amer: 40 mL/min — ABNORMAL LOW (ref >60)
Glucose, Bld: 109 mg/dL — ABNORMAL HIGH (ref 70–99)
Potassium: 3.7 mmol/L (ref 3.5–5.1)
Sodium: 141 mmol/L (ref 135–145)
Total Bilirubin: 2.6 mg/dL — ABNORMAL HIGH (ref 0.3–1.2)
Total Protein: 7.1 g/dL (ref 6.5–8.1)

## 2019-06-29 LAB — HEMOGLOBIN A1C
Hgb A1c MFr Bld: 7 % — ABNORMAL HIGH (ref 4.8–5.6)
Mean Plasma Glucose: 154.2 mg/dL

## 2019-06-29 LAB — CBC
HCT: 55.8 % — ABNORMAL HIGH (ref 39.0–52.0)
Hemoglobin: 17.9 g/dL — ABNORMAL HIGH (ref 13.0–17.0)
MCH: 29.3 pg (ref 26.0–34.0)
MCHC: 32.1 g/dL (ref 30.0–36.0)
MCV: 91.5 fL (ref 80.0–100.0)
Platelets: 278 10*3/uL (ref 150–400)
RBC: 6.1 MIL/uL — ABNORMAL HIGH (ref 4.22–5.81)
RDW: 14.1 % (ref 11.5–15.5)
WBC: 13.7 10*3/uL — ABNORMAL HIGH (ref 4.0–10.5)
nRBC: 0 % (ref 0.0–0.2)

## 2019-06-29 LAB — PROTIME-INR
INR: 1.2 (ref 0.8–1.2)
Prothrombin Time: 14.4 seconds (ref 11.4–15.2)

## 2019-06-29 LAB — LACTIC ACID, PLASMA
Lactic Acid, Venous: 2.4 mmol/L (ref 0.5–1.9)
Lactic Acid, Venous: 1.4 mmol/L (ref 0.5–1.9)

## 2019-06-29 LAB — MAGNESIUM: Magnesium: 1.9 mg/dL (ref 1.7–2.4)

## 2019-06-29 LAB — BRAIN NATRIURETIC PEPTIDE: B Natriuretic Peptide: 124.2 pg/mL — ABNORMAL HIGH (ref 0.0–100.0)

## 2019-06-29 LAB — PROCALCITONIN: Procalcitonin: <0.1 ng/mL

## 2019-06-29 LAB — ETHANOL: Alcohol, Ethyl (B): <10 mg/dL (ref <10)

## 2019-06-29 LAB — SARS CORONAVIRUS 2 BY RT PCR (HOSPITAL ORDER, PERFORMED IN ~~LOC~~ HOSPITAL LAB): SARS Coronavirus 2: NEGATIVE

## 2019-06-29 LAB — HIV ANTIBODY (ROUTINE TESTING W REFLEX): HIV Screen 4th Generation wRfx: NONREACTIVE

## 2019-06-29 MED ORDER — HEPARIN BOLUS VIA INFUSION
4000.0000 [IU] | Freq: Once | INTRAVENOUS | Status: AC
  Administered 2019-06-29: 4000 [IU] via INTRAVENOUS
  Filled 2019-06-29: qty 4000

## 2019-06-29 MED ORDER — IPRATROPIUM-ALBUTEROL 0.5-2.5 (3) MG/3ML IN SOLN: 3.0000 mL | Freq: Four times a day (QID) | RESPIRATORY_TRACT | Status: DC | PRN

## 2019-06-29 MED ORDER — VANCOMYCIN HCL 2000 MG/400ML IV SOLN
2000.0000 mg | Freq: Once | INTRAVENOUS | Status: AC
  Administered 2019-06-29: 2000 mg via INTRAVENOUS
  Filled 2019-06-29: qty 400

## 2019-06-29 MED ORDER — METRONIDAZOLE IN NACL 5-0.79 MG/ML-% IV SOLN
500.0000 mg | Freq: Once | INTRAVENOUS | Status: AC
  Administered 2019-06-29: 500 mg via INTRAVENOUS
  Filled 2019-06-29: qty 100

## 2019-06-29 MED ORDER — PANTOPRAZOLE SODIUM 40 MG PO TBEC
40.0000 mg | DELAYED_RELEASE_TABLET | Freq: Every day | ORAL | Status: DC
  Administered 2019-06-30: 40 mg via ORAL
  Filled 2019-06-29: qty 1

## 2019-06-29 MED ORDER — HYDROCORTISONE NA SUCCINATE PF 100 MG IJ SOLR
100.0000 mg | Freq: Once | INTRAMUSCULAR | Status: AC
  Administered 2019-06-29: 100 mg via INTRAVENOUS
  Filled 2019-06-29: qty 2

## 2019-06-29 MED ORDER — SODIUM CHLORIDE 0.9 % IV BOLUS
1000.0000 mL | Freq: Once | INTRAVENOUS | Status: AC
  Administered 2019-06-29: 1000 mL via INTRAVENOUS

## 2019-06-29 MED ORDER — FLUTICASONE FUROATE-VILANTEROL 200-25 MCG/INH IN AEPB
1.0000 | INHALATION_SPRAY | Freq: Every day | RESPIRATORY_TRACT | Status: DC
  Administered 2019-06-30: 1 via RESPIRATORY_TRACT
  Filled 2019-06-29: qty 28

## 2019-06-29 MED ORDER — VANCOMYCIN HCL IN DEXTROSE 1-5 GM/200ML-% IV SOLN
1000.0000 mg | Freq: Once | INTRAVENOUS | Status: DC
  Filled 2019-06-29: qty 200

## 2019-06-29 MED ORDER — METHYLPREDNISOLONE SODIUM SUCC 40 MG IJ SOLR
40.0000 mg | Freq: Four times a day (QID) | INTRAMUSCULAR | Status: DC
  Administered 2019-06-30: 40 mg via INTRAVENOUS
  Filled 2019-06-29 (×3): qty 1

## 2019-06-29 MED ORDER — SODIUM CHLORIDE 0.9 % IV SOLN: 2.0000 g | Freq: Once | INTRAVENOUS | Status: DC

## 2019-06-29 MED ORDER — NOREPINEPHRINE 4 MG/250ML-% IV SOLN
2.0000 ug/min | INTRAVENOUS | Status: DC
  Administered 2019-06-29: 2 ug/min via INTRAVENOUS

## 2019-06-29 MED ORDER — INSULIN ASPART 100 UNIT/ML ~~LOC~~ SOLN
0.0000 [IU] | SUBCUTANEOUS | Status: DC
  Administered 2019-06-29 – 2019-06-30 (×3): 2 [IU] via SUBCUTANEOUS

## 2019-06-29 MED ORDER — SODIUM CHLORIDE 0.9 % IV SOLN
2.0000 g | Freq: Two times a day (BID) | INTRAVENOUS | Status: DC
  Administered 2019-06-30: 2 g via INTRAVENOUS
  Filled 2019-06-29 (×2): qty 2

## 2019-06-29 MED ORDER — ASPIRIN EC 81 MG PO TBEC
81.0000 mg | DELAYED_RELEASE_TABLET | Freq: Every day | ORAL | Status: DC
  Administered 2019-06-30: 81 mg via ORAL
  Filled 2019-06-29: qty 1

## 2019-06-29 MED ORDER — POLYETHYLENE GLYCOL 3350 17 G PO PACK
17.0000 g | PACK | Freq: Every day | ORAL | Status: DC | PRN
  Administered 2019-06-29: 17 g via ORAL
  Filled 2019-06-29: qty 1

## 2019-06-29 MED ORDER — DOCUSATE SODIUM 100 MG PO CAPS: 100.0000 mg | ORAL_CAPSULE | Freq: Two times a day (BID) | ORAL | Status: DC | PRN

## 2019-06-29 MED ORDER — HEPARIN (PORCINE) 25000 UT/250ML-% IV SOLN
1200.0000 [IU]/h | INTRAVENOUS | Status: DC
  Administered 2019-06-29: 1200 [IU]/h via INTRAVENOUS
  Filled 2019-06-29: qty 250

## 2019-06-29 MED ORDER — VANCOMYCIN HCL 750 MG/150ML IV SOLN
750.0000 mg | Freq: Two times a day (BID) | INTRAVENOUS | Status: DC
  Administered 2019-06-30: 750 mg via INTRAVENOUS
  Filled 2019-06-29 (×2): qty 150

## 2019-06-29 MED ORDER — OXYCODONE-ACETAMINOPHEN 5-325 MG PO TABS
2.0000 | ORAL_TABLET | ORAL | Status: DC | PRN
  Administered 2019-06-29 – 2019-06-30 (×4): 2 via ORAL
  Filled 2019-06-29 (×5): qty 2

## 2019-06-29 MED ORDER — IOHEXOL 350 MG/ML SOLN
80.0000 mL | Freq: Once | INTRAVENOUS | Status: AC | PRN
  Administered 2019-06-29: 80 mL via INTRAVENOUS

## 2019-06-29 MED ORDER — ENOXAPARIN SODIUM 40 MG/0.4ML ~~LOC~~ SOLN
40.0000 mg | SUBCUTANEOUS | Status: DC
  Filled 2019-06-29: qty 0.4

## 2019-06-29 MED ORDER — NICOTINE 14 MG/24HR TD PT24
14.0000 mg | MEDICATED_PATCH | Freq: Every day | TRANSDERMAL | Status: DC
  Administered 2019-06-30: 14 mg via TRANSDERMAL
  Filled 2019-06-29: qty 1

## 2019-06-29 MED ORDER — SODIUM CHLORIDE 0.9 % IV SOLN: 250.0000 mL | INTRAVENOUS | Status: DC

## 2019-06-29 MED ORDER — SODIUM CHLORIDE 0.9% FLUSH: 3.0000 mL | Freq: Once | INTRAVENOUS | Status: DC

## 2019-06-29 NOTE — ED Provider Notes (Signed)
Patient CARE signed out to continue to monitor blood pressure, adjust norepinephrine and admit to ICU. On examination patient feels improved, blood pressure 87 systolic.  Patient received fluids.  Differential diagnosis broadened to include sepsis, antibiotics ordered by physician assistant.  Blood pressure has improved in the ER with IV fluids.  Patient had repeat EKG performed and reviewed by myself patient now in sinus rhythm. Patient will be monitored until critical care bed available.  EKG reviewed heart rate 99, sinus rhythm, nonspecific T wave changes, prolonged QT. CRITICAL CARE Performed by: Enid Skeens   Total critical care time: 35 minutes  Critical care time was exclusive of separately billable procedures and treating other patients.  Critical care was necessary to treat or prevent imminent or life-threatening deterioration.  Critical care was time spent personally by me on the following activities: development of treatment plan with patient and/or surrogate as well as nursing, discussions with consultants, evaluation of patient's response to treatment, examination of patient, obtaining history from patient or surrogate, ordering and performing treatments and interventions, ordering and review of laboratory studies, ordering and review of radiographic studies, pulse oximetry and re-evaluation of patient's condition.   Kenton Kingfisher, MD 07/01/19 [phone]

## 2019-06-29 NOTE — Consult Note (Addendum)
NAME:  Scott [name], MRN:  [id number], DOB:  18-Aug-1960, LOS: 0 ADMISSION DATE:  06/29/2019, CONSULTATION DATE:  06/29/2019 REFERRING MD:  Dr Sherwood Gambler, CHIEF COMPLAINT: Shock  Brief History   59 year old gentleman was brought into the hospital with a syncopal attack He said he woke up from sleep with a numb left upper extremity, he was able to get up and move around but fell hitting his head.  Spouse had difficulty arousing patient so called EMS.  EMS noted hypotension when he was initially evaluated.  Followed in the office by Dr. Lamonte Sakai and Wyn Quaker nurse petitioner  History of present illness   He had been coughing for about 2 to 3 days with brown-yellow secretions He has underlying COPD for about 74 to 14 years, heart failure since childhood.  History of back surgery Stated he was feeling relatively well yesterday apart from the cough, denies any fevers or chills Has been smoking referral  Past Medical History   Past Medical History:  Diagnosis Date  . 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 COPD (chronic obstructive pulmonary disease) ([place])   . Diabetes mellitus without complication ([place])   . GERD (gastroesophageal reflux disease)   . Headache(784.0)   . Pneumonia    hx   Significant Hospital Events   Hypotension requiring fluid boluses Low-dose pressors started Noted to be in atrial fibrillation-offered cardioversion because of his hemodynamic instability which he refused  Consults:  pccm  Procedures:  None  Significant Diagnostic Tests:  CT scan of the chest 1. No evidence of aortic aneurysm, dissection, or other acute aortic pathology. Mild mixed calcific atherosclerosis. Aortic Atherosclerosis (ICD10-I70.0).  2. Occasional cecal diverticula, and wall thickening fat stranding about the cecum, consistent with nonspecific infectious or inflammatory colitis, possibly although not definitely reflecting diverticulitis.  3.  Emphysema  (ICD10-J43.9). 4. New, scattered irregular nodular opacities of the lungs, the largest in the central right upper lobe measuring 1.2 cm, likely infectious or inflammatory. Non-contrast chest CT at 3-6 months is recommended. If the nodules are stable at time of repeat CT, then future CT at 18-24 months (from today's scan) is considered optional for low-risk patients, but is recommended for high-risk patients.   CT scan of the head IMPRESSION: No acute intracranial pathology.  Last echocardiogram was 12/26/2018 Ejection fraction of 30 to 35%, severely decreased function, significant left ventricular septal wall thickening, grade 1 diastolic dysfunction Echocardiogram 09/20/2018 did show significant biatrial enlargement  Sleep study 02/07/2018 significant for snoring and nocturnal hypoxemia  PFT 11/17/2016-moderately severe obstructive lung disease with moderately reduced diffusing capacity   Micro Data:  Blood culture 06/29/2019 Antimicrobials:  Vancomycin Rocephin  Interim history/subjective:  Is awake and alert Feeling a little bit better Denies any chest pains or chest discomfort Moving all extremities with no pain or discomfort  Objective   Blood pressure 100/69, pulse (!) 101, temperature 97.8 F (36.6 C), temperature source Oral, resp. rate 19, height _0  (1.702 m), weight 97.6 kg, SpO2 92 %.       No intake or output data in the 24 hours ending 06/29/19 1611 Filed Weights   06/29/19 1354  Weight: 97.6 kg    Examination: General: Middle-age gentleman, does not appear to be in acute respirator distress HENT: Moist oral mucosa Lungs: Few rales at the bases Cardiovascular: S1-S2 appreciated Abdomen: Soft, bowel sounds appreciated Extremities: No clubbing, no edema Neuro: Alert and oriented x3, moving all extremities, no focal  findings GU:   Resolved Hospital Problem list     Assessment & Plan:  Shock Sepsis-likely related to respiratory tract  infection Pneumonia -Follow blood cultures -Wean pressors  -Will continue vancomycin and cefepime  He is currently on Levophed peripherally at 2 mics per minute, this was stopped following my evaluation on I believe he may be off pressors Has had about 2 L of fluid boluses We will give him another 500 cc of LR if MAP not maintained above 65  Atrial fibrillation -Converted back to sinus rhythm  Cardiomyopathy Systolic and diastolic congestive heart failure -Repeat echocardiogram to assess for progression -Continues to drink and smoke significantly  Chronic obstructive pulmonary disease with exacerbation Emphysema -Bronchodilators -Steroids  Abnormal CT scan of the chest showing nodular infiltrate -multiple nodules -This will need follow-up CT in about 3 months to document stability versus progression -Was not present on the scan from 05/26/2018  Diabetes -SSI  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 Cervical postlaminectomy syndrome -Pain management as needed  Tobacco use disorder -Smoking cessation counseling   Try and wean off pressors  On Entresto and Lasix-on hold secondary to hypotension Address Neurontin, Crestor, Janumet, Valium-when stable  Best practice:  Diet: Heart healthy Pain/Anxiety/Delirium protocol (if indicated):  VAP protocol (if indicated): Not indicated DVT prophylaxis: Lovenox GI prophylaxis: Protonix Glucose control: SSI Mobility: Bedrest Code Status: Full code Family Communication: Patient is fully awake and alert Disposition: ICU Labs   CBC: Recent Labs  Lab 06/29/19 1414 06/29/19 1425  WBC 13.7*  --   HGB 17.9* 18.7*  HCT 55.8* 55.0*  MCV 91.5  --   PLT 278  --     Basic Metabolic Panel: Recent Labs  Lab 06/29/19 1414 06/29/19 1425  NA 141 139  K 3.7 3.5  CL 99 97*  CO2 26  --   GLUCOSE 109* 105*  BUN 12 16  CREATININE 1.82* 1.90*  CALCIUM 9.1  --   MG 1.9  --    GFR: Estimated Creatinine  Clearance: 47.2 mL/min (A) (by C-G formula based on SCr of 1.9 mg/dL (H)). Recent Labs  Lab 06/29/19 1414  WBC 13.7*    Liver Function Tests: Recent Labs  Lab 06/29/19 1414  AST 24  ALT 15  ALKPHOS 101  BILITOT 2.6*  PROT 7.1  ALBUMIN 3.8   No results for input(s): LIPASE, AMYLASE in the last 168 hours. No results for input(s): AMMONIA in the last 168 hours.  ABG    Component Value Date/Time   PHART 7.404 09/23/2018 1037   PCO2ART 56.1 (H) 09/23/2018 1037   PO2ART 69.0 (L) 09/23/2018 1037   HCO3 33.0 (H) 09/23/2018 1045   TCO2 28 06/29/2019 1425   O2SAT 55.0 09/23/2018 1045     Coagulation Profile: Recent Labs  Lab 06/29/19 1414  INR DUE TO ELEVATED HEMATOCRIT, BLOOD:ANTICOAGULANT RATIO IN TUBE IS UNSATISFACTORY. RESULTS MAY BE AFFECTED. PLEASE RECOLLECT IN ANTICOAGULANT-ADJUSTED TUBE, OBTAINABLE FROM THE LAB.    Cardiac Enzymes: No results for input(s): CKTOTAL, CKMB, CKMBINDEX, TROPONINI in the last 168 hours.  HbA1C: Hgb A1c MFr Bld  Date/Time Value Ref Range Status  09/20/2018 04:31 PM 6.4 (H) 4.8 - 5.6 % Final    Comment:    (NOTE) Pre diabetes:          5.7%-6.4% Diabetes:              >6.4% Glycemic control for   <7.0% adults with diabetes   12/26/2017 12:34 PM 6.1 (H) 4.8 -  5.6 % Final    Comment:             Prediabetes: 5.7 - 6.4          Diabetes: >6.4          Glycemic control for adults with diabetes: <7.0     CBG: No results for input(s): GLUCAP in the last 168 hours.  Review of Systems:   Cough with shortness of breath for about 2 to 3 days No fever Chronic back pain Chronic shortness of breath  Past Medical History  He,  has a past medical history of Arthritis, COPD (chronic obstructive pulmonary disease) ([place]), Diabetes mellitus without complication ([place]), GERD (gastroesophageal reflux disease), Headache(784.0), and Pneumonia.   Surgical History    Past Surgical History:  Procedure Laterality Date  . ANTERIOR CERVICAL  DECOMP/DISCECTOMY FUSION  12/22/2010   Procedure: ANTERIOR CERVICAL DECOMPRESSION/DISCECTOMY FUSION 3 LEVELS;  Surgeon: Ophelia Charter;  Location: [facility];  Service: Neurosurgery;  Laterality: N/A;  Anterior cervical discectomy with fusion cervical three-four, four-five, and five sixCDF with Interbody Prosthesis, plating, and Bone Graft   . ANTERIOR CERVICAL DECOMP/DISCECTOMY FUSION N/A 10/16/2012   Procedure: CERVICAL SIX-SEVEN ANTERIOR CERVICAL DECOMPRESSION/DISCECTOMY FUSION WITH INTERBODY PROTHESIS PLATING BONEGRAFT WITH /POSSIBLE HARDWARE REMOVAL OLD PLATE;  Surgeon: Ophelia Charter, MD;  Location: [facility];  Service: Neurosurgery;  Laterality: N/A;  . MULTIPLE TOOTH EXTRACTIONS    . OTHER SURGICAL HISTORY     surgery on cheekbone and head for fall 2000  . 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 RIGHT/LEFT HEART CATH AND CORONARY ANGIOGRAPHY N/A 09/23/2018   Procedure: RIGHT/LEFT HEART CATH AND CORONARY ANGIOGRAPHY and possible PCI/stent;  Surgeon: MBurnell Blanks MD;  [facility];  Service: Cardiovascular;  Laterality: N/A;  . SPINE SURGERY     2014 and 2016 - Dr JArnoldo Morale . TONSILLECTOMY       Social History   reports that he has been smoking cigarettes. He started smoking about 46 years ago. He has a 41.00 pack-year smoking history. He has never used smokeless tobacco. He reports current alcohol use. He reports that he does not use drugs.   Family History   His family history includes Asthma in his brother and sister; COPD in his mother; Diabetes in his brother; Emphysema in his mother; Heart disease in his maternal uncle; Hyperlipidemia in his brother and sister; Hypertension in his brother, brother, and sister; Mental illness in his sister.   Allergies Allergies  Allergen Reactions  . Penicillins Anaphylaxis    Has patient had a PCN reaction  causing immediate rash, facial/tongue/throat swelling, SOB or lightheadedness with hypotension: Yes Has patient had a PCN reaction causing severe rash involving mucus membranes or skin necrosis: Yes Has patient had a PCN reaction that required hospitalization: Yes Has patient had a PCN reaction occurring within the last 10 years: No If all of the above answers are "NO", then may proceed with Cephalosporin use.   .[name][Duloxetine Hcl]     "crazy thoughts"     Home Medications  Prior to Admission medications   Medication Sig Start Date End Date Taking? Authorizing Provider  ADVAIR DISKUS 250-50 MCG/DOSE AEPB INHALE 1 PUFF INTO THE LUNGS TWICE DAILY Patient taking differently: Inhale 1 puff into the lungs daily.  12/18/18  Yes BBaltazar Apo  S, MD  albuterol (VENTOLIN HFA) 108 (90 Base) MCG/ACT inhaler Inhale 1-2 puffs into the lungs every 6 (six) hours as needed for wheezing or shortness of breath. 11/25/18  Yes Lauraine Rinne, NP  aspirin 325 MG EC tablet Take 81 mg by mouth daily.   Yes [provider]  bisoprolol (ZEBETA) 5 MG tablet Take 1 tablet (5 mg total) by mouth daily. 01/31/19  Yes Lendon Colonel, NP  COMBIVENT RESPIMAT 20-100 MCG/ACT AERS respimat INHALE 1 PUFF BY MOUTH INTO THE LUNGS FOUR TIMES A DAY Patient taking differently: Inhale 1 puff into the lungs in the morning, at noon, in the evening, and at bedtime.  11/11/18  Yes Collene Gobble, MD  diazepam (VALIUM) 5 MG tablet Take 5 mg by mouth 2 (two) times daily as needed for anxiety. 08/07/18  Yes [provider]  furosemide (LASIX) 40 MG tablet Take 1 tablet (40 mg total) by mouth daily. 01/30/19  Yes Lendon Colonel, NP  gabapentin (NEURONTIN) 300 MG capsule TAKE 1 CAPSULE BY MOUTH 3 TIMES DAILY 11/21/18  Yes Rutherford Guys, MD  ipratropium-albuterol (DUONEB) 0.5-2.5 (3) MG/3ML SOLN INHALE THE CONTENTS OF 1 VIAL VIA NEBULIZER EVERY 6 HOURS AS DIRECTED FOR SHORTNESS OF BREATH OR WHEEZING 05/19/19  Yes  Byrum, Rose Fillers, MD  JANUMET 50-500 MG tablet TAKE 1 TABLET BY MOUTH ONCE A DAY 07/31/18  Yes Rutherford Guys, MD  omeprazole (PRILOSEC) 40 MG capsule TAKE 1 CAPSULE BY MOUTH EVERY DAY 01/08/19  Yes Rutherford Guys, MD  oxyCODONE-acetaminophen (PERCOCET) 10-325 MG tablet Take 1 tablet by mouth every 4 (four) hours. 05/28/18  Yes Oretha Milch D, MD  rosuvastatin (CRESTOR) 10 MG tablet Take 10 mg by mouth daily.   Yes Fredrich Romans, PA  sacubitril-valsartan (ENTRESTO) 24-26 MG Take 1 tablet by mouth 2 (two) times daily. 01/30/19  Yes Lendon Colonel, NP  Vitamin D, Ergocalciferol, (DRISDOL) 1.25 MG (50000 UT) CAPS capsule TAKE ONE CAPSULE BY MOUTH ONCE WEEKLY Patient taking differently: Take 50,000 Units by mouth every 7 (seven) days.  12/18/18  Yes Rutherford Guys, MD  fluticasone [facility]) 50 MCG/ACT nasal spray Place 2 sprays into both nostrils daily. Patient not taking: Reported on 06/29/2019 12/31/18   Collene Gobble, MD  [facility] 4 MG/0.1ML LIQD nasal spray kit Place 1 spray into the nose daily as needed (For overdose).  06/13/18   [provider]  nicotine (NICODERM CQ - DOSED IN MG/24 HOURS) 14 mg/24hr patch PLACE 1 PATCH ONTO THE SKIN DAILY FOR 28 DAYS Patient not taking: Reported on 06/29/2019 12/11/18   Lauraine Rinne, NP  nicotine polacrilex (NICOTINE MINI) 2 MG lozenge Take 1 lozenge (2 mg total) by mouth as needed for smoking cessation. (maximum: 5 lozenges every 6 hours; 20 lozenges/day). Patient not taking: Reported on 06/29/2019 11/26/18   Lauraine Rinne, NP  predniSONE (DELTASONE) 10 MG tablet Take 4 tablets for 3 days, 3 for 3 days, 2 for 3 days, 1 for 3 days then Stop. Patient not taking: Reported on 06/29/2019 12/31/18   Collene Gobble, MD     The patient is critically ill with multiple organ systems failure and requires high complexity decision making for assessment and support, frequent evaluation and titration of therapies, application of advanced monitoring technologies and  extensive interpretation of multiple databases. Critical Care Time devoted to patient care services described in this note independent of APP/resident time (if applicable)  is 30 minutes.   Gaven Eugene  [facility] Personal pager: [phone] If unanswered, please page CCM On-call: [phone]

## 2019-06-29 NOTE — Progress Notes (Signed)
Pharmacy Antibiotic Note  Scott [name] is a 59 y.o. male admitted on 06/29/2019 with sepsis.  Pharmacy has been consulted for Cefepime and Vancomycin dosing.   Height: 5\' 7"  (170.2 cm) Weight: 97.6 kg (215 lb 1.6 oz) IBW/kg (Calculated) : 66.1  Temp (24hrs), Avg:97.8 F (36.6 C), Min:97.8 F (36.6 C), Max:97.8 F (36.6 C)  Recent Labs  Lab 06/29/19 1414 06/29/19 1425  WBC 13.7*  --   CREATININE 1.82* 1.90*    Estimated Creatinine Clearance: 47.2 mL/min (A) (by C-G formula based on SCr of 1.9 mg/dL (H)).    Allergies  Allergen Reactions  . Penicillins Anaphylaxis    Has patient had a PCN reaction causing immediate rash, facial/tongue/throat swelling, SOB or lightheadedness with hypotension: Yes Has patient had a PCN reaction causing severe rash involving mucus membranes or skin necrosis: Yes Has patient had a PCN reaction that required hospitalization: Yes Has patient had a PCN reaction occurring within the last 10 years: No If all of the above answers are "NO", then may proceed with Cephalosporin use.   08/29/19 Cymbalta [Duloxetine Hcl]     "crazy thoughts"    Antimicrobials this admission: 6/6 Cefepime >>  6/6 Vancomycin >>   Dose adjustments this admission: N/a  Microbiology results: Pending   Plan:  - PCN allergy from ~ 40 years ago pt states, with this and minimal cross-reactivity will give Cefepime in place of Azetronam . - Cefepime 2g IV q12h - Vancomycin 2000mg  IV x 1 dose  - Followed by Vancomycin 750mg  IV q12h ( nomogram dosing)  - Monitor patients renal function and urine output  - De-escalate ABX when appropriate   Thank you for allowing pharmacy to be a part of this patient's care.  Marland Kitchen PharmD. BCPS 06/29/2019 3:32 PM

## 2019-06-29 NOTE — ED Provider Notes (Addendum)
[facility] EMERGENCY DEPARTMENT Provider Note   CSN: [id number] Arrival date & time: 06/29/19  1346   History Chief Complaint  Patient presents with  . Loss of Consciousness   Scott [name] is a 59 y.o. male with past history significant for COPD, diabetes, CHF, chronic pain who presents for evaluation of syncopal episode.  Apparently patient had syncopal episode this morning and then recurrent 1 around 11 PM.  Wife was unable to arouse patient.  He has been having some chest pain and some back pain however states his back pain is chronic in nature.  And does admit to "a few beers and smoking refer."  His wife is unaware this.  States he was acting fine yesterday evening.  On EMS arrival patient systolic blood pressure at 76, patient pale, cool and diaphoretic.  Was given 500 cc by EMS.  On arrival to ED patient hypotensive, tachycardic in Afib with RVR and hypoxic.  Mentating appropriately, AxO x3.  Moves all 4 extremities without difficulty.   Level 5 acuity of condition  HPI     Past Medical History:  Diagnosis Date  . 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 COPD (chronic obstructive pulmonary disease) ([place])   . Diabetes mellitus without complication ([place])   . GERD (gastroesophageal reflux disease)   . Headache(784.0)   . Pneumonia    hx    Patient Active Problem List   Diagnosis Date Noted  . Hypertension 11/27/2018  . Acute on chronic systolic CHF (congestive heart failure), NYHA class 3 ([place])   . Non-ischemic cardiomyopathy ([place])   . Nausea 05/27/2018  . Acute respiratory failure with hypoxia ([place]) 05/26/2018  . COPD with acute exacerbation ([place]) 04/04/2018  . Tachycardia 03/31/2018  . Chronic pain syndrome 08/29/2017  . Tobacco use disorder 05/13/2015  . Sebaceous cyst 01/08/2014  . Nocturnal hypoxia 06/01/2013  . Acute bronchitis 06/01/2013  . Diabetes mellitus without complication ([place])   . GERD (gastroesophageal reflux  disease)   . Cervical post-laminectomy syndrome 10/02/2012  . Degeneration of lumbar or lumbosacral intervertebral disc 06/01/2011  . COPD (chronic obstructive pulmonary disease) ([place]) 05/30/2011  . Hyperlipidemia 05/30/2011  . Allergic rhinitis 05/30/2011  . Cervical spondylosis with myelopathy 12/22/2010    Past Surgical History:  Procedure Laterality Date  . ANTERIOR CERVICAL DECOMP/DISCECTOMY FUSION  12/22/2010   Procedure: ANTERIOR CERVICAL DECOMPRESSION/DISCECTOMY FUSION 3 LEVELS;  Surgeon: Ophelia Charter;  Location: [facility];  Service: Neurosurgery;  Laterality: N/A;  Anterior cervical discectomy with fusion cervical three-four, four-five, and five sixCDF with Interbody Prosthesis, plating, and Bone Graft   . ANTERIOR CERVICAL DECOMP/DISCECTOMY FUSION N/A 10/16/2012   Procedure: CERVICAL SIX-SEVEN ANTERIOR CERVICAL DECOMPRESSION/DISCECTOMY FUSION WITH INTERBODY PROTHESIS PLATING BONEGRAFT WITH /POSSIBLE HARDWARE REMOVAL OLD PLATE;  Surgeon: Ophelia Charter, MD;  Location: [facility];  Service: Neurosurgery;  Laterality: N/A;  . MULTIPLE TOOTH EXTRACTIONS    . OTHER SURGICAL HISTORY     surgery on cheekbone and head for fall 2000  . 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 RIGHT/LEFT HEART CATH AND CORONARY ANGIOGRAPHY N/A 09/23/2018   Procedure: RIGHT/LEFT HEART CATH AND CORONARY ANGIOGRAPHY and possible PCI/stent;  Surgeon: MBurnell Blanks MD;  [facility];  Service: Cardiovascular;  Laterality: N/A;  . SPINE SURGERY  2014 and 2016 - Dr Arnoldo Morale  . TONSILLECTOMY         Family History  Problem Relation Age of Onset  . Emphysema Mother   . COPD Mother   . Asthma Brother   . Diabetes Brother   . Hyperlipidemia Brother   . Hypertension Brother   . Asthma Sister   . Hyperlipidemia Sister   . Hypertension Sister   . Mental illness Sister     . Heart disease Maternal Uncle   . Hypertension Brother     Social History   Tobacco Use  . Smoking status: Current Every Day Smoker    Packs/day: 1.00    Years: 41.00    Pack years: 41.00    Types: Cigarettes    Start date: 01/23/1973  . Smokeless tobacco: Never Used  . Tobacco comment: 8-9 cigs a day   Substance Use Topics  . Alcohol use: Yes    Comment: Beer occasionally  . Drug use: No    Home Medications Prior to Admission medications   Medication Sig Start Date End Date Taking? Authorizing Provider  ADVAIR DISKUS 250-50 MCG/DOSE AEPB INHALE 1 PUFF INTO THE LUNGS TWICE DAILY 12/18/18   Collene Gobble, MD  albuterol (VENTOLIN HFA) 108 (90 Base) MCG/ACT inhaler Inhale 1-2 puffs into the lungs every 6 (six) hours as needed for wheezing or shortness of breath. 11/25/18   Lauraine Rinne, NP  aspirin 325 MG EC tablet Take 81 mg by mouth daily.    [provider]  bisoprolol (ZEBETA) 5 MG tablet Take 1 tablet (5 mg total) by mouth daily. 01/31/19   Lendon Colonel, NP  buPROPion [facility] SR) 150 MG 12 hr tablet Take 150 mg by mouth 2 (two) times daily. Start using 150 mg SR daily for 3 days then increase to 150 mg SR twice daily (8 hours apart). Started using on 11/16 and expected to titrate on 11/19.    [provider]  COMBIVENT RESPIMAT 20-100 MCG/ACT AERS respimat INHALE 1 PUFF BY MOUTH INTO THE LUNGS FOUR TIMES A DAY 11/11/18   Byrum, Rose Fillers, MD  diazepam (VALIUM) 5 MG tablet Take 5 mg by mouth 2 (two) times daily as needed for anxiety. 08/07/18   [provider]  fluticasone (FLONASE) 50 MCG/ACT nasal spray Place 2 sprays into both nostrils daily. 12/31/18   Collene Gobble, MD  furosemide (LASIX) 40 MG tablet Take 1 tablet (40 mg total) by mouth daily. 01/30/19   Lendon Colonel, NP  gabapentin (NEURONTIN) 300 MG capsule TAKE 1 CAPSULE BY MOUTH 3 TIMES DAILY 11/21/18   Rutherford Guys, MD  ipratropium-albuterol (DUONEB) 0.5-2.5 (3) MG/3ML SOLN  INHALE THE CONTENTS OF 1 VIAL VIA NEBULIZER EVERY 6 HOURS AS DIRECTED FOR SHORTNESS OF BREATH OR WHEEZING 05/19/19   Collene Gobble, MD  JANUMET 50-500 MG tablet TAKE 1 TABLET BY MOUTH ONCE A DAY 07/31/18   Rutherford Guys, MD  [facility] 4 MG/0.1ML LIQD nasal spray kit  06/13/18   [provider]  nicotine (NICODERM CQ - DOSED IN MG/24 HOURS) 14 mg/24hr patch PLACE 1 PATCH ONTO THE SKIN DAILY FOR 28 DAYS Patient taking differently: Pt reported not consistently taking 12/11/18   Lauraine Rinne, NP  nicotine polacrilex (NICOTINE MINI) 2 MG lozenge Take 1 lozenge (2 mg total) by mouth as needed for smoking cessation. (maximum: 5 lozenges every 6 hours; 20 lozenges/day). Patient taking differently: Take 2 mg by mouth as needed for smoking  cessation. (maximum: 5 lozenges every 6 hours; 20 lozenges/day). Pt reported not consistently taking 11/26/18   Lauraine Rinne, NP  omeprazole (PRILOSEC) 40 MG capsule TAKE 1 CAPSULE BY MOUTH EVERY DAY 01/08/19   Rutherford Guys, MD  oxyCODONE-acetaminophen (PERCOCET) 10-325 MG tablet Take 1 tablet by mouth every 4 (four) hours. 05/28/18   Desiree Hane, MD  predniSONE (DELTASONE) 10 MG tablet Take 4 tablets for 3 days, 3 for 3 days, 2 for 3 days, 1 for 3 days then Stop. 12/31/18   Collene Gobble, MD  rosuvastatin (CRESTOR) 10 MG tablet Take 10 mg by mouth daily.    Fredrich Romans, PA  sacubitril-valsartan (ENTRESTO) 24-26 MG Take 1 tablet by mouth 2 (two) times daily. 01/30/19   Lendon Colonel, NP  Vitamin D, Ergocalciferol, (DRISDOL) 1.25 MG (50000 UT) CAPS capsule TAKE ONE CAPSULE BY MOUTH ONCE WEEKLY 12/18/18   Rutherford Guys, MD    Allergies    Penicillins and Cymbalta [duloxetine hcl]  Review of Systems   Review of Systems  Unable to perform ROS: Acuity of condition  Constitutional: Positive for diaphoresis and fatigue.  HENT: Negative.   Respiratory: Positive for cough, chest tightness and shortness of breath. Negative for apnea, choking, wheezing  and stridor.   Cardiovascular: Positive for chest pain and palpitations. Negative for leg swelling.  Gastrointestinal: Positive for abdominal pain, nausea and vomiting. Negative for abdominal distention, anal bleeding, blood in stool, constipation, diarrhea and rectal pain.  Genitourinary: Negative.   Musculoskeletal: Positive for back pain. Negative for arthralgias, gait problem, neck pain and neck stiffness.  Skin: Negative.   Neurological: Positive for weakness (Generalized), light-headedness, numbness and headaches. Negative for dizziness, tremors, seizures, syncope, facial asymmetry and speech difficulty.  All other systems reviewed and are negative.  Physical Exam Updated Vital Signs BP 100/69   Pulse (!) 101   Temp 97.8 F (36.6 C) (Oral)   Resp 19   Ht 5' 7" (1.702 m)   Wt 97.6 kg   SpO2 92%   BMI 33.69 kg/m   Physical Exam Vitals and nursing note reviewed.  Constitutional:      General: He is in acute distress.     Appearance: He is well-developed. He is ill-appearing and diaphoretic.  HENT:     Head: Normocephalic and atraumatic.     Nose: Nose normal.     Mouth/Throat:     Mouth: Mucous membranes are moist.  Eyes:     Pupils: Pupils are equal, round, and reactive to light.  Neck:     Comments: No neck stiffness or neck rigidity Cardiovascular:     Rate and Rhythm: Tachycardia present. Rhythm irregular.     Heart sounds: Normal heart sounds.  Pulmonary:     Effort: Pulmonary effort is normal. No respiratory distress.     Breath sounds: Normal breath sounds. No wheezing or rales.     Comments: Clear to auscultation.  Requiring supplemental oxygen at 3 L via nasal cannula.  Speaks in full sentences without difficulty. Chest:     Chest wall: No tenderness.  Abdominal:     General: Bowel sounds are normal. There is no distension.     Palpations: Abdomen is soft.     Tenderness: There is abdominal tenderness.     Comments: Diffuse tenderness to abdomen without  rebound or guarding.  No midline pulsatile abdominal mass.  Musculoskeletal:        General: No swelling, tenderness, deformity or signs of injury.  Normal range of motion.     Cervical back: Normal range of motion and neck supple.     Right lower leg: No edema.     Left lower leg: No edema.     Comments: Moves all 4 extremities without difficulty.  No bony tenderness.   Skin:    General: Skin is warm.     Capillary Refill: Capillary refill takes less than 2 seconds.     Comments: No edema, erythema or warmth.  No fluctuance or induration.  Neurological:     General: No focal deficit present.     Mental Status: He is alert.     Cranial Nerves: No cranial nerve deficit.     Sensory: No sensory deficit.     Motor: No weakness.     Coordination: Coordination normal.     Comments: Cranial nerves II through XII grossly intact.  Intact sensation to bilateral upper and lower extremities without difficulty.  Moves all 4 extremities without difficulty. Unable to assess gait    ED Results / Procedures / Treatments   Labs (all labs ordered are listed, but only abnormal results are displayed) Labs Reviewed  COMPREHENSIVE METABOLIC PANEL - Abnormal; Notable for the following components:      Result Value   Glucose, Bld 109 (*)    Creatinine, Ser 1.82 (*)    Total Bilirubin 2.6 (*)    GFR calc non Af Amer 40 (*)    GFR calc Af Amer 46 (*)    Anion gap 16 (*)    All other components within normal limits  BRAIN NATRIURETIC PEPTIDE - Abnormal; Notable for the following components:   B Natriuretic Peptide 124.2 (*)    All other components within normal limits  CBC - Abnormal; Notable for the following components:   WBC 13.7 (*)    RBC 6.10 (*)    Hemoglobin 17.9 (*)    HCT 55.8 (*)    All other components within normal limits  I-STAT CHEM 8, ED - Abnormal; Notable for the following components:   Chloride 97 (*)    Creatinine, Ser 1.90 (*)    Glucose, Bld 105 (*)    Calcium, Ion 0.97 (*)      Hemoglobin 18.7 (*)    HCT 55.0 (*)    All other components within normal limits  TROPONIN I (HIGH SENSITIVITY) - Abnormal; Notable for the following components:   Troponin I (High Sensitivity) 42 (*)    All other components within normal limits  CULTURE, BLOOD (ROUTINE X 2)  CULTURE, BLOOD (ROUTINE X 2)  SARS CORONAVIRUS 2 BY RT PCR (HOSPITAL ORDER, [facility])  URINE CULTURE  PROTIME-INR  MAGNESIUM  LACTIC ACID, PLASMA  LACTIC ACID, PLASMA  ETHANOL  RAPID URINE DRUG SCREEN, HOSP PERFORMED  URINALYSIS, ROUTINE W REFLEX MICROSCOPIC  PROTIME-INR  TROPONIN I (HIGH SENSITIVITY)    EKG EKG Interpretation  Date/Time:  Sunday June 29 2019 13:58:29 EDT Ventricular Rate:  149 PR Interval:    QRS Duration: 105 QT Interval:  331 QTC Calculation: 522 R Axis:   -102 Text Interpretation: Atrial fibrillation LAD, consider left anterior fascicular block Consider right ventricular hypertrophy Prolonged QT interval afib new since Aug 2020 Confirmed by Sherwood Gambler [phone]) on 06/29/2019 2:06:28 PM   Radiology CT Head Wo Contrast  Result Date: 06/29/2019 CLINICAL DATA:  Syncope. EXAM: CT HEAD WITHOUT CONTRAST TECHNIQUE: Contiguous axial images were obtained from the base of the skull through the vertex without  intravenous contrast. COMPARISON:  None. FINDINGS: Brain: No evidence of acute infarction, hemorrhage, hydrocephalus, extra-axial collection or mass lesion/mass effect. Vascular: No hyperdense vessel or unexpected calcification. Skull: Normal. Negative for fracture or focal lesion. Sinuses/Orbits: No acute finding. Other: None. IMPRESSION: No acute intracranial pathology. Electronically Signed   By: Virgina Norfolk M.D.   On: 06/29/2019 15:09   DG Chest Portable 1 View  Result Date: 06/29/2019 CLINICAL DATA:  Syncopal event, fell going into the bathroom after having a few beers, chest pain; history COPD, diabetes mellitus, hypertension, non ischemic  cardiomyopathy EXAM: PORTABLE CHEST 1 VIEW COMPARISON:  Portable exam 1432 hours compared to 09/20/2018 FINDINGS: External pacing leads present. Normal heart size, mediastinal contours, and pulmonary vascularity. Emphysematous and minimal bronchitic changes consistent with COPD. Minimal RIGHT basilar atelectasis. No infiltrate, pleural effusion or pneumothorax. Prior cervical spine surgery. IMPRESSION: COPD changes with minimal RIGHT basilar atelectasis. Electronically Signed   By: Lavonia Dana M.D.   On: 06/29/2019 15:00   CT Angio Chest/Abd/Pel for Dissection W and/or Wo Contrast  Addendum Date: 06/29/2019   ADDENDUM REPORT: 06/29/2019 15:34 ADDENDUM: Addendum is made to complete impression, erroneously omitted from original report: 4. New, scattered irregular nodular opacities of the lungs, the largest in the central right upper lobe measuring 1.2 cm, likely infectious or inflammatory. Non-contrast chest CT at 3-6 months is recommended. If the nodules are stable at time of repeat CT, then future CT at 18-24 months (from today's scan) is considered optional for low-risk patients, but is recommended for high-risk patients. This recommendation follows the consensus statement: Guidelines for Management of Incidental Pulmonary Nodules Detected on CT Images: From the Fleischner Society 2017; Radiology 2017; 284:228-243. Electronically Signed   By: Eddie Candle M.D.   On: 06/29/2019 15:34   Result Date: 06/29/2019 CLINICAL DATA:  Aortic dissection suspected, hypotensive, possible shock EXAM: CT ANGIOGRAPHY CHEST, ABDOMEN AND PELVIS TECHNIQUE: Non-contrast CT of the chest was initially obtained. Multidetector CT imaging through the chest, abdomen and pelvis was performed using the standard protocol during bolus administration of intravenous contrast. Multiplanar reconstructed images and MIPs were obtained and reviewed to evaluate the vascular anatomy. CONTRAST:  41m OMNIPAQUE IOHEXOL 350 MG/ML SOLN COMPARISON:   05/26/2018 FINDINGS: CTA CHEST FINDINGS Cardiovascular: Preferential opacification of the thoracic aorta. Mild mixed calcific atherosclerosis. Normal contour and caliber of the thoracic aorta without evidence of aneurysm, dissection, or other acute aortic pathology. Normal heart size. Thickening of the left ventricular myocardium, consistent with hypertension. No pericardial effusion. Mediastinum/Nodes: No enlarged mediastinal, hilar, or axillary lymph nodes. Thyroid gland, trachea, and esophagus demonstrate no significant findings. Lungs/Pleura: Moderate centrilobular and paraseptal emphysema. Diffuse bilateral bronchial wall thickening. There are scattered irregular nodular opacities, the largest in the central right upper lobe measuring 1.2 x 0.9 cm, new compared to prior examination (series 7, image 53). No pleural effusion or pneumothorax. Musculoskeletal: No chest wall abnormality. No acute or significant osseous findings. Review of the MIP images confirms the above findings. CTA ABDOMEN AND PELVIS FINDINGS VASCULAR Mild mixed calcific atherosclerosis of the abdominal aorta. Normal contour and caliber without evidence of aneurysm, dissection, or other acute aortic pathology. 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 The branch vessel origins are patent. Review of the MIP images confirms the above findings. NON-VASCULAR Hepatobiliary: No solid liver abnormality is seen. No gallstones, gallbladder wall thickening, or biliary dilatation. Pancreas: Unremarkable. No pancreatic ductal dilatation or surrounding inflammatory changes. Spleen: Normal in size without significant abnormality. Adrenals/Urinary Tract: Adrenal glands are unremarkable. Kidneys are  normal, without renal calculi, solid lesion, or hydronephrosis. Bladder is unremarkable. Stomach/Bowel: Stomach is within normal limits. Appendix appears normal. There are occasional cecal diverticula, and there is wall thickening and fat stranding about the  cecum (series 5, image 208, series 13, image 64). Lymphatic: No enlarged abdominal or pelvic lymph nodes. Reproductive: No mass or other significant abnormality. Other: No abdominal wall hernia or abnormality. No abdominopelvic ascites. Musculoskeletal: No acute or significant osseous findings. Review of the MIP images confirms the above findings. IMPRESSION: 1. No evidence of aortic aneurysm, dissection, or other acute aortic pathology. Mild mixed calcific atherosclerosis. Aortic Atherosclerosis (ICD10-I70.0). 2. Occasional cecal diverticula, and wall thickening fat stranding about the cecum, consistent with nonspecific infectious or inflammatory colitis, possibly although not definitely reflecting diverticulitis. 3.  Emphysema (ICD10-J43.9). These results were called by telephone at the time of interpretation on 06/29/2019 at 3:00 pm to Dr. Sherwood Gambler , who verbally acknowledged these results. Electronically Signed: By: Eddie Candle M.D. On: 06/29/2019 15:15    Procedures .Critical Care Performed by: Nettie Elm, PA-C Authorized by: Nettie Elm, PA-C   Critical care provider statement:    Critical care time (minutes):  45   Critical care was necessary to treat or prevent imminent or life-threatening deterioration of the following conditions:  Shock, respiratory failure and circulatory failure   Critical care was time spent personally by me on the following activities:  Discussions with consultants, evaluation of patient's response to treatment, examination of patient, ordering and performing treatments and interventions, ordering and review of laboratory studies, ordering and review of radiographic studies, pulse oximetry, re-evaluation of patient's condition, obtaining history from patient or surrogate and review of old charts   (including critical care time)  Medications Ordered in ED Medications  sodium chloride flush (NS) 0.9 % injection 3 mL (has no administration in time  range)  0.9 %  sodium chloride infusion (has no administration in time range)  norepinephrine (LEVOPHED) 29m in 2533mpremix infusion (2 mcg/min Intravenous New Bag/Given 06/29/19 1524)  metroNIDAZOLE (FLAGYL) IVPB 500 mg (has no administration in time range)  hydrocortisone sodium succinate (SOLU-CORTEF) 100 MG injection 100 mg (has no administration in time range)  ceFEPIme (MAXIPIME) 2 g in sodium chloride 0.9 % 100 mL IVPB (has no administration in time range)  vancomycin (VANCOREADY) IVPB 2000 mg/400 mL (has no administration in time range)    Followed by  vancomycin (VANCOREADY) IVPB 750 mg/150 mL (has no administration in time range)  sodium chloride 0.9 % bolus 1,000 mL (0 mLs Intravenous Stopped 06/29/19 1525)  iohexol (OMNIPAQUE) 350 MG/ML injection 80 mL (80 mLs Intravenous Contrast Given 06/29/19 1446)  sodium chloride 0.9 % bolus 1,000 mL (1,000 mLs Intravenous New Bag/Given 06/29/19 1450)    ED Course  I have reviewed the triage vital signs and the nursing notes.  Pertinent labs & imaging results that were available during my care of the patient were reviewed by me and considered in my medical decision making (see chart for details).   Patient hypotensive, hypoxic and tachycardic and new onset A. fib with RVR.  2 syncopal episodes at home, found pale, diaphoretic and clammy.  Has been having chest pain, headache as well as some intermittent numbness.  Multiple episodes of emesis at home. Attending, Dr. GoColin Mulderso bedside due to acuity of patient.  Defibrillator pads placed.  Patient refusing cardioversion due to A. fib with RVR with hypotension.  Attending discussed with self in room possible risks of death, permanent  organ damage given hypotension and lack of perfusion to organs.  Voices understanding of risk versus benefit. He has capacity to make medical decisions and is AxO x3.  Patient initially given 1 L of fluids with some improvement in his blood pressures however patient quickly  became hypotensive again. Was given additional 2 L.  For a total of 2.5 L normal saline. Persistently hypotensive.  Was started on Levophed for persistent hypotension maps less than 65.  Protecting his airway.  Continues to maintain mentation.  Tactile temperature to extremities with equal pulses bilaterally neurovascularly intact.  Plan on labs, imaging and reassess.  Labs and imaging personally reviewed and interpreted: Troponin elevated at 42 Magnesium 1.9 Metabolic panel with AKI with new creatinine 1.9 up from previous anion gap 16 CBC with mild leukocytosis at 13.7 Lactic acid pending EKG A. fib with RVR no STEMI  Unsure of cause of shock. Dissection study do not show evidence of large PE, dissection however does show some mild bowel thickening possible diverticulitis however patient denies any diarrhea. No infectious symptoms.  Nonetheless will need to be admitted to critical care for further observation.  Give broad-spectrum antibiotics given unsure cause of hypotension.  His troponin initially is mildly elevated however he has good perfusion to his distal extremities.  He is neurovascularly intact.    Patient's tachycardia has significantly improved with fluids.  No longer in A. fib with RVR, sinus rhythm with heart rate of 98.  No STEMI.  Consult with Critical care MD who will evaluate patient for admission.  Patient has been seen eval by attending, Dr. Regenia Skeeter who agrees above treatment, plan and disposition.  The patient appears reasonably stabilized for admission considering the current resources, flow, and capabilities available in the ED at this time, and I doubt any other [facility] requiring further screening and/or treatment in the ED prior to admission.    MDM Rules/Calculators/A&P                       Final Clinical Impression(s) / ED Diagnoses Final diagnoses:  Shock ([place])  Atrial fibrillation, unspecified type ([place])  Syncope, unspecified syncope type  AKI (acute kidney  injury) ([place])  Hypotension, unspecified hypotension type  Elevated troponin    Rx / DC Orders ED Discharge Orders    None       Theodis Kinsel A, PA-C 06/29/19 1600    Tanush Drees A, PA-C 06/29/19 1601    Sherwood Gambler, MD 07/02/19 [phone]

## 2019-06-29 NOTE — ED Notes (Signed)
This RN to CT with pt.

## 2019-06-29 NOTE — Progress Notes (Signed)
ANTICOAGULATION CONSULT NOTE - Initial Consult  Pharmacy Consult for heparin  Indication: chest pain/ACS  Allergies  Allergen Reactions  . Penicillins Anaphylaxis    Has patient had a PCN reaction causing immediate rash, facial/tongue/throat swelling, SOB or lightheadedness with hypotension: Yes Has patient had a PCN reaction causing severe rash involving mucus membranes or skin necrosis: Yes Has patient had a PCN reaction that required hospitalization: Yes Has patient had a PCN reaction occurring within the last 10 years: No If all of the above answers are "NO", then may proceed with Cephalosporin use.   Scott [name] [Duloxetine Hcl]     "crazy thoughts"    Patient Measurements: Height: 5' 7" (170.2 cm) Weight: 97.6 kg (215 lb 1.6 oz) IBW/kg (Calculated) : 66.1 Heparin Dosing Weight: 87kg  Vital Signs: Temp: 98 F (36.7 C) (06/06 1852) Temp Source: Oral (06/06 1852) BP: 96/63 (06/06 1830) Pulse Rate: 90 (06/06 1830)  Labs: Recent Labs    06/29/19 1414 06/29/19 1425 06/29/19 1654  HGB 17.9* 18.7*  --   HCT 55.8* 55.0*  --   PLT 278  --   --   LABPROT DUE TO ELEVATED HEMATOCRIT, BLOOD:ANTICOAGULANT RATIO IN TUBE IS UNSATISFACTORY. RESULTS MAY BE AFFECTED. PLEASE RECOLLECT IN ANTICOAGULANT-ADJUSTED TUBE, OBTAINABLE FROM THE LAB.  --  14.4  INR DUE TO ELEVATED HEMATOCRIT, BLOOD:ANTICOAGULANT RATIO IN TUBE IS UNSATISFACTORY. RESULTS MAY BE AFFECTED. PLEASE RECOLLECT IN ANTICOAGULANT-ADJUSTED TUBE, OBTAINABLE FROM THE LAB.  --  1.2  CREATININE 1.82* 1.90*  --   TROPONINIHS 42*  --  312*    Estimated Creatinine Clearance: 47.2 mL/min (A) (by C-G formula based on SCr of 1.9 mg/dL (H)).   Medical History: Past Medical History:  Diagnosis Date  . 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 COPD (chronic obstructive pulmonary disease) ([place])   . Diabetes mellitus without complication ([place])   . GERD (gastroesophageal reflux disease)   . Headache(784.0)   .  Pneumonia    hx    Medications:  Medications Prior to Admission  Medication Sig Dispense Refill Last Dose  . ADVAIR DISKUS 250-50 MCG/DOSE AEPB INHALE 1 PUFF INTO THE LUNGS TWICE DAILY (Patient taking differently: Inhale 1 puff into the lungs in the morning and at bedtime. ) 60 each 5 06/29/2019 at Unknown time  . albuterol (VENTOLIN HFA) 108 (90 Base) MCG/ACT inhaler Inhale 1-2 puffs into the lungs every 6 (six) hours as needed for wheezing or shortness of breath. 18 g 11 06/29/2019 at Unknown time  . aspirin 325 MG EC tablet Take 81 mg by mouth daily.   06/29/2019 at Unknown time  . bisoprolol (ZEBETA) 5 MG tablet Take 1 tablet (5 mg total) by mouth daily. 90 tablet 3 06/29/2019 at 0600  . COMBIVENT RESPIMAT 20-100 MCG/ACT AERS respimat INHALE 1 PUFF BY MOUTH INTO THE LUNGS FOUR TIMES A DAY (Patient taking differently: Inhale 1 puff into the lungs in the morning, at noon, in the evening, and at bedtime. ) 4 g 0 06/29/2019 at Unknown time  . diazepam (VALIUM) 5 MG tablet Take 5 mg by mouth 2 (two) times daily as needed for anxiety.   06/29/2019 at Unknown time  . furosemide (LASIX) 40 MG tablet Take 1 tablet (40 mg total) by mouth daily. 90 tablet 3 06/29/2019 at Unknown time  . gabapentin (NEURONTIN) 300 MG capsule TAKE 1 CAPSULE BY MOUTH 3 TIMES DAILY (Patient taking differently: Take 300 mg by mouth 3 (three) times daily. )  90 capsule 0 06/29/2019 at Unknown time  . ipratropium-albuterol (DUONEB) 0.5-2.5 (3) MG/3ML SOLN INHALE THE CONTENTS OF 1 VIAL VIA NEBULIZER EVERY 6 HOURS AS DIRECTED FOR SHORTNESS OF BREATH OR WHEEZING 360 mL 0 Past Month at Unknown time  . JANUMET 50-500 MG tablet TAKE 1 TABLET BY MOUTH ONCE A DAY (Patient taking differently: Take 1 tablet by mouth daily. ) 90 tablet 0 06/29/2019 at Unknown time  . omeprazole (PRILOSEC) 40 MG capsule TAKE 1 CAPSULE BY MOUTH EVERY DAY (Patient taking differently: Take 40 mg by mouth daily. ) 30 capsule 0 06/29/2019 at Unknown time  . oxyCODONE-acetaminophen  (PERCOCET) 10-325 MG tablet Take 1 tablet by mouth every 4 (four) hours. 15 tablet 0 06/29/2019 at Unknown time  . rosuvastatin (CRESTOR) 10 MG tablet Take 10 mg by mouth daily.   06/28/2019 at Unknown time  . sacubitril-valsartan (ENTRESTO) 24-26 MG Take 1 tablet by mouth 2 (two) times daily. 180 tablet 3 06/29/2019 at Unknown time  . Vitamin D, Ergocalciferol, (DRISDOL) 1.25 MG (50000 UT) CAPS capsule TAKE ONE CAPSULE BY MOUTH ONCE WEEKLY (Patient taking differently: Take 50,000 Units by mouth every 7 (seven) days. ) 12 capsule 0 Past Week at Unknown time  . fluticasone (FLONASE) 50 MCG/ACT nasal spray Place 2 sprays into both nostrils daily. (Patient not taking: Reported on 06/29/2019) 16 g 12 Not Taking at Unknown time  . NARCAN 4 MG/0.1ML LIQD nasal spray kit Place 1 spray into the nose daily as needed (For overdose).    unknown at unkn  . nicotine (NICODERM CQ - DOSED IN MG/24 HOURS) 14 mg/24hr patch PLACE 1 PATCH ONTO THE SKIN DAILY FOR 28 DAYS (Patient not taking: Reported on 06/29/2019) 28 patch 2 Not Taking at Unknown time  . nicotine polacrilex (NICOTINE MINI) 2 MG lozenge Take 1 lozenge (2 mg total) by mouth as needed for smoking cessation. (maximum: 5 lozenges every 6 hours; 20 lozenges/day). (Patient not taking: Reported on 06/29/2019) 288 tablet 11 Not Taking at Unknown time  . predniSONE (DELTASONE) 10 MG tablet Take 4 tablets for 3 days, 3 for 3 days, 2 for 3 days, 1 for 3 days then Stop. (Patient not taking: Reported on 06/29/2019) 30 tablet 0 Not Taking at Unknown time   Scheduled:  . [START ON 06/30/2019] aspirin  81 mg Oral Daily  . [START ON 06/30/2019] fluticasone furoate-vilanterol  1 puff Inhalation Daily  . insulin aspart  0-15 Units Subcutaneous Q4H  . [START ON 06/30/2019] methylPREDNISolone (SOLU-MEDROL) injection  40 mg Intravenous Q6H  . nicotine  14 mg Transdermal Daily  . [START ON 06/30/2019] pantoprazole  40 mg Oral Daily  . sodium chloride flush  3 mL Intravenous Once     Assessment: 64 you male here with syncope and troponin up to 312. Pharmacy consulted to dose heparin. No anticoagulants noted PTA.  -ht= 19.7, plt= 278  Goal of Therapy:  Heparin level 0.3-0.7 units/ml Monitor platelets by anticoagulation protocol: Yes   Plan: -Heparin bolus 4000 units IV followed by 1200 units/hr  -Heparin level in 8 hours and daily wth CBC daily  Hildred Laser, PharmD Clinical Pharmacist **Pharmacist phone directory can now be found on [email or web address] ([email or web address]).  Listed under [place].

## 2019-06-29 NOTE — ED Triage Notes (Signed)
Pt bib ems after syncopal event today. Pt endorses having a "few beers". Got up to go to the restroom and ended up having a syncopal event and falling into the bathroom. Pt with emesis per EMS. Pt bp initially 76 palpated on scene and cool, pale, diaphoretic. Given 500cc NS en route with improvement to 97/60. Pt speech slurred. Sinus rhythm with frequent PAC's.

## 2019-06-29 NOTE — ED Provider Notes (Signed)
Medical screening examination/treatment/procedure(s) were conducted as a shared visit with non-physician practitioner(s) and myself.  I personally evaluated the patient during the encounter.  EKG Interpretation  Date/Time:  Sunday June 29 2019 13:58:29 EDT Ventricular Rate:  149 PR Interval:    QRS Duration: 105 QT Interval:  331 QTC Calculation: 522 R Axis:   -102 Text Interpretation: Atrial fibrillation LAD, consider left anterior fascicular block Consider right ventricular hypertrophy Prolonged QT interval afib new since Aug 2020 Confirmed by Pricilla Loveless [phone]) on 06/29/2019 2:06:28 PM   Patient with syncope and shock.  He is persistently hypotensive.  He is in A. fib which seems to be new according to his chart review though it is unclear when exactly this started.  All of his syncope/weakness started this morning.  He is also hypoxic.  He does have an acute kidney injury though his BUN is normal, unclear how acute/subacute this is.  His blood pressure initially came up with fluids though is now downtrending.  Will place on Levophed.  I did briefly discuss cardioversion given the new A. fib and shock but the degree of A. fib is less likely to be causing the shock.  He actually refused cardioversion.  He is alert and oriented and seems understand the gravity of the situation.  Dissection study obtained after consent with patient given his kidney injury.  No obvious findings for shock including no central PE or dissection.  He will need supportive care and further work-up and treatment.  Will give antibiotics.  Needs to go to the ICU.  CRITICAL CARE Performed by: Audree Camel   Total critical care time: 40 minutes  Critical care time was exclusive of separately billable procedures and treating other patients.  Critical care was necessary to treat or prevent imminent or life-threatening deterioration.  Critical care was time spent personally by me on the following activities:  development of treatment plan with patient and/or surrogate as well as nursing, discussions with consultants, evaluation of patient's response to treatment, examination of patient, obtaining history from patient or surrogate, ordering and performing treatments and interventions, ordering and review of laboratory studies, ordering and review of radiographic studies, pulse oximetry and re-evaluation of patient's condition.    Pricilla Loveless, MD 06/29/19 [phone]

## 2019-06-30 ENCOUNTER — Inpatient Hospital Stay (HOSPITAL_COMMUNITY): Payer: Medicare HMO

## 2019-06-30 ENCOUNTER — Telehealth: Payer: Self-pay | Admitting: Pulmonary Disease

## 2019-06-30 DIAGNOSIS — R55 Syncope and collapse: Secondary | ICD-10-CM | POA: Diagnosis not present

## 2019-06-30 DIAGNOSIS — R579 Shock, unspecified: Secondary | ICD-10-CM | POA: Diagnosis not present

## 2019-06-30 LAB — BASIC METABOLIC PANEL
Anion gap: 8 (ref 5–15)
BUN: 19 mg/dL (ref 6–20)
CO2: 28 mmol/L (ref 22–32)
Calcium: 8 mg/dL — ABNORMAL LOW (ref 8.9–10.3)
Chloride: 104 mmol/L (ref 98–111)
Creatinine, Ser: 1.35 mg/dL — ABNORMAL HIGH (ref 0.61–1.24)
GFR calc Af Amer: >60 mL/min (ref >60)
GFR calc non Af Amer: 57 mL/min — ABNORMAL LOW (ref >60)
Glucose, Bld: 132 mg/dL — ABNORMAL HIGH (ref 70–99)
Potassium: 4.2 mmol/L (ref 3.5–5.1)
Sodium: 140 mmol/L (ref 135–145)

## 2019-06-30 LAB — CBC
HCT: 44.3 % (ref 39.0–52.0)
Hemoglobin: 14.2 g/dL (ref 13.0–17.0)
MCH: 29.6 pg (ref 26.0–34.0)
MCHC: 32.1 g/dL (ref 30.0–36.0)
MCV: 92.3 fL (ref 80.0–100.0)
Platelets: 199 10*3/uL (ref 150–400)
RBC: 4.8 MIL/uL (ref 4.22–5.81)
RDW: 14.4 % (ref 11.5–15.5)
WBC: 8.8 10*3/uL (ref 4.0–10.5)
nRBC: 0 % (ref 0.0–0.2)

## 2019-06-30 LAB — HEPARIN LEVEL (UNFRACTIONATED): Heparin Unfractionated: 0.31 IU/mL (ref 0.30–0.70)

## 2019-06-30 LAB — GLUCOSE, CAPILLARY
Glucose-Capillary: 131 mg/dL — ABNORMAL HIGH (ref 70–99)
Glucose-Capillary: 136 mg/dL — ABNORMAL HIGH (ref 70–99)
Glucose-Capillary: 96 mg/dL (ref 70–99)

## 2019-06-30 LAB — MAGNESIUM: Magnesium: 1.9 mg/dL (ref 1.7–2.4)

## 2019-06-30 LAB — PHOSPHORUS: Phosphorus: 4.5 mg/dL (ref 2.5–4.6)

## 2019-06-30 MED ORDER — DOXYCYCLINE MONOHYDRATE 100 MG PO TABS: 100.0000 mg | ORAL_TABLET | Freq: Two times a day (BID) | ORAL | 0 refills | Status: DC

## 2019-06-30 MED ORDER — PREDNISONE 20 MG PO TABS: 40.0000 mg | ORAL_TABLET | Freq: Every day | ORAL | 0 refills | Status: AC

## 2019-06-30 NOTE — Discharge Summary (Signed)
Physician Discharge Summary         Patient ID: Scott [name] MRN: [id number] DOB/AGE: [birth year] 58 y.o.  Admit date: 06/29/2019 Discharge date: 06/30/2019  Discharge Diagnoses:    AECOPD Chronic hypoxic respiratory failure Hypotension AKI Atrial fibrillation  Systolic heart failure  DM type 2   Discharge summary    59 year old gentleman with prior history of ongoing tobacco abuse, COPD (followed by Dr. Lamonte Sakai), chronic hypoxic respiratory failure on 2L O2 at night, chronic systolic heart failure (EF 30-35%, G1DD), NICM, HTN, DM type 2, GERD, back surgery, and arthritis who presented 6/6 after syncopal episode at home.  Patient awoke up from sleep with a numb left upper extremity.  He was able to get up and move around and was in a coughing spell when he became dizzy and fell hitting his head.  Reports 2-3 day history of productive brown-yellow cough and malaise with poor appetite and intake. Denied any fever or chills. Spouse had difficulty arousing patient so called EMS.  EMS noted hypotension when he was initially evaluated.   In the ER, he was afebrile, not hypoxic on room air, and persistently hypotensive despite fluid bolus requiring low dose vasopressor.  He remained alert and oriented without focal deficit.  Additionally noted to be in atrial fibrillation which is new for patient.  Noted on labs to have AKI, BNP 124, flat trend in troponin hs, leukocytosis, and CXR with emphysematous and bronchitic changes consistent with COPD and minimal right basilar atelectasis.  He was empirically started on cefepime and vancomycin.  CT of head was negative for acute intracranial pathology.  CTA PE was negative for acute PE but noted to have new scattered irregular nodular opacities, largest in the central right upper lobe measuring 1.2cm, thought to be infectious or inflammatory.   He was started on heparin gtt and PCCM called for admission.  He was quickly weaned from norepinephrine with fluid  resuscitation.  He has remained in sinus rhythm and since remained hemodynamically stable but on the softer side, therefore his home heart failure and hypertension medications have not been restarted. His renal function is improved and good urine output.  He is asymptomatic with no further dizzy or near syncopal spells and is ready to be discharged home.  He was able to ambulate around ICU on room air with sats dropping to the 70's but remained asymptomatic and quickly returned to baseline with rest.  He has home O2 at home already and advised to use as needed with exertion. He will be discharged home, his wife is at the bedside and will follow up in our pulmonary office 6/8 with repeat lab work and to follow up with cardiology and blood pressure check in office 6/8.  He has been counseled on tobacco abuse cessation.    Discharge Plan by Active Problems    Acute exacerbation COPD Chronic hypoxic respiratory failure  Tobacco abuse  - will follow up with Rexene Edison, NP in our pulmonary office 6/8 at 130pm - continue home nebs- duonebs prn, albuterol HFA BID, combivent respirmat QID, advair BID - use flutter valve for pulmonary hygiene  - supplemental O2 q HS and with exertion - prednisone 40 mg for 7 days - Doxycyline  - ongoing   Hypotension/ SIRS - resolved with fluid resuscitation   Chronic systolic heart failure  HTN - hold home bisoprolol, entresto and lasix today - continue home rosuvastatin - will recheck blood pressure in office 6/8  - will need to follow up  with cardiology, patient to make appointment  Paroxysmal Atrial Fibrillation  - CHADS2-VASc score 3 - will continue ASA 325 mg daily and defer to cardiology for further systemic anticoagulation  AKI - repeat BMP 6/8 in office   Diabetes type 2 - resume home janumet  Chronic pain/ back pain - continue home neurontin, valium  Significant Hospital tests/ studies   Last echocardiogram was 12/26/2018 Ejection fraction  of 30 to 35%, severely decreased function, significant left ventricular septal wall thickening, grade 1 diastolic dysfunction Echocardiogram 09/20/2018 did show significant biatrial enlargement  Sleep study 02/07/2018 significant for snoring and nocturnal hypoxemia  6/6 CTH >> No acute intracranial pathology.  6/6 CTA PE >> 1. No evidence of aortic aneurysm, dissection, or other acute aortic pathology. Mild mixed calcific atherosclerosis. Aortic Atherosclerosis. 2. Occasional cecal diverticula, and wall thickening fat stranding about the cecum, consistent with nonspecific infectious or inflammatory colitis, possibly although not definitely reflecting diverticulitis. 3.  Emphysema  4. New, scattered irregular nodular opacities of the lungs, the largest in the central right upper lobe measuring 1.2 cm, likely infectious or inflammatory. Non-contrast chest CT at 3-6 months is recommended.  Procedures   N/a  Culture data/antimicrobials   6/6 SARS 2 >> neg 6/6 BCx 2 >> ngtd   Consults  n/a    Discharge Exam: BP (!) 121/59 (BP Location: Left Arm)    Pulse 77    Temp 98.2 F (36.8 C) (Oral)    Resp 12    Ht '5\' 7"'  (1.702 m)    Wt 98 kg    SpO2 92%    BMI 33.84 kg/m   General:  Adult male sitting on side of bed in NAD HEENT: MM pink/moist, small left frontal hematoma  Neuro: AOx 4, MAE, non focal  CV: rr, no murmur PULM:  Non labored, speaking full sentences, slight end expiratory wheeze throughout otherwise clear and diminished GI: soft, bs+, ND/ NT  Extremities: warm/dry, no LE edema  Skin: no rashes  Labs at discharge   Lab Results  Component Value Date   CREATININE 1.35 (H) 06/30/2019   BUN 19 06/30/2019   NA 140 06/30/2019   K 4.2 06/30/2019   CL 104 06/30/2019   CO2 28 06/30/2019   Lab Results  Component Value Date   WBC 8.8 06/30/2019   HGB 14.2 06/30/2019   HCT 44.3 06/30/2019   MCV 92.3 06/30/2019   PLT 199 06/30/2019   Lab Results  Component Value Date    ALT 15 06/29/2019   AST 24 06/29/2019   ALKPHOS 101 06/29/2019   BILITOT 2.6 (H) 06/29/2019   Lab Results  Component Value Date   INR 1.2 06/29/2019   INR  06/29/2019    DUE TO ELEVATED HEMATOCRIT, BLOOD:ANTICOAGULANT RATIO IN TUBE IS UNSATISFACTORY. RESULTS MAY BE AFFECTED. PLEASE RECOLLECT IN ANTICOAGULANT-ADJUSTED TUBE, OBTAINABLE FROM THE LAB.   INR 1.0 07/10/2018    Current radiological studies    CT Head Wo Contrast  Result Date: 06/29/2019 CLINICAL DATA:  Syncope. EXAM: CT HEAD WITHOUT CONTRAST TECHNIQUE: Contiguous axial images were obtained from the base of the skull through the vertex without intravenous contrast. COMPARISON:  None. FINDINGS: Brain: No evidence of acute infarction, hemorrhage, hydrocephalus, extra-axial collection or mass lesion/mass effect. Vascular: No hyperdense vessel or unexpected calcification. Skull: Normal. Negative for fracture or focal lesion. Sinuses/Orbits: No acute finding. Other: None. IMPRESSION: No acute intracranial pathology. Electronically Signed   By: Virgina Norfolk M.D.   On: 06/29/2019 15:09  DG Chest Portable 1 View  Result Date: 06/29/2019 CLINICAL DATA:  Syncopal event, fell going into the bathroom after having a few beers, chest pain; history COPD, diabetes mellitus, hypertension, non ischemic cardiomyopathy EXAM: PORTABLE CHEST 1 VIEW COMPARISON:  Portable exam 1432 hours compared to 09/20/2018 FINDINGS: External pacing leads present. Normal heart size, mediastinal contours, and pulmonary vascularity. Emphysematous and minimal bronchitic changes consistent with COPD. Minimal RIGHT basilar atelectasis. No infiltrate, pleural effusion or pneumothorax. Prior cervical spine surgery. IMPRESSION: COPD changes with minimal RIGHT basilar atelectasis. Electronically Signed   By: Lavonia Dana M.D.   On: 06/29/2019 15:00   CT Angio Chest/Abd/Pel for Dissection W and/or Wo Contrast  Addendum Date: 06/29/2019   ADDENDUM REPORT: 06/29/2019 15:34  ADDENDUM: Addendum is made to complete impression, erroneously omitted from original report: 4. New, scattered irregular nodular opacities of the lungs, the largest in the central right upper lobe measuring 1.2 cm, likely infectious or inflammatory. Non-contrast chest CT at 3-6 months is recommended. If the nodules are stable at time of repeat CT, then future CT at 18-24 months (from today's scan) is considered optional for low-risk patients, but is recommended for high-risk patients. This recommendation follows the consensus statement: Guidelines for Management of Incidental Pulmonary Nodules Detected on CT Images: From the Fleischner Society 2017; Radiology 2017; 284:228-243. Electronically Signed   By: Eddie Candle M.D.   On: 06/29/2019 15:34   Result Date: 06/29/2019 CLINICAL DATA:  Aortic dissection suspected, hypotensive, possible shock EXAM: CT ANGIOGRAPHY CHEST, ABDOMEN AND PELVIS TECHNIQUE: Non-contrast CT of the chest was initially obtained. Multidetector CT imaging through the chest, abdomen and pelvis was performed using the standard protocol during bolus administration of intravenous contrast. Multiplanar reconstructed images and MIPs were obtained and reviewed to evaluate the vascular anatomy. CONTRAST:  54m OMNIPAQUE IOHEXOL 350 MG/ML SOLN COMPARISON:  05/26/2018 FINDINGS: CTA CHEST FINDINGS Cardiovascular: Preferential opacification of the thoracic aorta. Mild mixed calcific atherosclerosis. Normal contour and caliber of the thoracic aorta without evidence of aneurysm, dissection, or other acute aortic pathology. Normal heart size. Thickening of the left ventricular myocardium, consistent with hypertension. No pericardial effusion. Mediastinum/Nodes: No enlarged mediastinal, hilar, or axillary lymph nodes. Thyroid gland, trachea, and esophagus demonstrate no significant findings. Lungs/Pleura: Moderate centrilobular and paraseptal emphysema. Diffuse bilateral bronchial wall thickening. There are  scattered irregular nodular opacities, the largest in the central right upper lobe measuring 1.2 x 0.9 cm, new compared to prior examination (series 7, image 53). No pleural effusion or pneumothorax. Musculoskeletal: No chest wall abnormality. No acute or significant osseous findings. Review of the MIP images confirms the above findings. CTA ABDOMEN AND PELVIS FINDINGS VASCULAR Mild mixed calcific atherosclerosis of the abdominal aorta. Normal contour and caliber without evidence of aneurysm, dissection, or other acute aortic pathology. 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 The branch vessel origins are patent. Review of the MIP images confirms the above findings. NON-VASCULAR Hepatobiliary: No solid liver abnormality is seen. No gallstones, gallbladder wall thickening, or biliary dilatation. Pancreas: Unremarkable. No pancreatic ductal dilatation or surrounding inflammatory changes. Spleen: Normal in size without significant abnormality. Adrenals/Urinary Tract: Adrenal glands are unremarkable. Kidneys are normal, without renal calculi, solid lesion, or hydronephrosis. Bladder is unremarkable. Stomach/Bowel: Stomach is within normal limits. Appendix appears normal. There are occasional cecal diverticula, and there is wall thickening and fat stranding about the cecum (series 5, image 208, series 13, image 64). Lymphatic: No enlarged abdominal or pelvic lymph nodes. Reproductive: No mass or other significant abnormality. Other: No abdominal  wall hernia or abnormality. No abdominopelvic ascites. Musculoskeletal: No acute or significant osseous findings. Review of the MIP images confirms the above findings. IMPRESSION: 1. No evidence of aortic aneurysm, dissection, or other acute aortic pathology. Mild mixed calcific atherosclerosis. Aortic Atherosclerosis (ICD10-I70.0). 2. Occasional cecal diverticula, and wall thickening fat stranding about the cecum, consistent with nonspecific infectious or inflammatory  colitis, possibly although not definitely reflecting diverticulitis. 3.  Emphysema (ICD10-J43.9). These results were called by telephone at the time of interpretation on 06/29/2019 at 3:00 pm to Dr. Sherwood Gambler , who verbally acknowledged these results. Electronically Signed: By: Eddie Candle M.D. On: 06/29/2019 15:15    Disposition:    Discharge disposition: 01-Home or Self Care         Allergies as of 06/30/2019      Reactions   Penicillins Anaphylaxis   Has patient had a PCN reaction causing immediate rash, facial/tongue/throat swelling, SOB or lightheadedness with hypotension: Yes Has patient had a PCN reaction causing severe rash involving mucus membranes or skin necrosis: Yes Has patient had a PCN reaction that required hospitalization: Yes Has patient had a PCN reaction occurring within the last 10 years: No If all of the above answers are "NO", then may proceed with Cephalosporin use.   Cymbalta [duloxetine Hcl]    "crazy thoughts"      Medication List    STOP taking these medications   bisoprolol 5 MG tablet Commonly known as: ZEBETA   fluticasone 50 MCG/ACT nasal spray Commonly known as: FLONASE   furosemide 40 MG tablet Commonly known as: LASIX   nicotine 14 mg/24hr patch Commonly known as: NICODERM CQ - dosed in mg/24 hours   nicotine polacrilex 2 MG lozenge Commonly known as: Nicotine Mini   sacubitril-valsartan 24-26 MG Commonly known as: ENTRESTO     TAKE these medications   Advair Diskus 250-50 MCG/DOSE Aepb Generic drug: Fluticasone-Salmeterol INHALE 1 PUFF INTO THE LUNGS TWICE DAILY What changed: See the new instructions.   albuterol 108 (90 Base) MCG/ACT inhaler Commonly known as: Ventolin HFA Inhale 1-2 puffs into the lungs every 6 (six) hours as needed for wheezing or shortness of breath.   aspirin 325 MG EC tablet Take 81 mg by mouth daily.   Combivent Respimat 20-100 MCG/ACT Aers respimat Generic drug: Ipratropium-Albuterol INHALE 1  PUFF BY MOUTH INTO THE LUNGS FOUR TIMES A DAY What changed: See the new instructions.   ipratropium-albuterol 0.5-2.5 (3) MG/3ML Soln Commonly known as: DUONEB INHALE THE CONTENTS OF 1 VIAL VIA NEBULIZER EVERY 6 HOURS AS DIRECTED FOR SHORTNESS OF BREATH OR WHEEZING What changed: Another medication with the same name was changed. Make sure you understand how and when to take each.   diazepam 5 MG tablet Commonly known as: VALIUM Take 5 mg by mouth 2 (two) times daily as needed for anxiety.   doxycycline 100 MG tablet Commonly known as: ADOXA Take 1 tablet (100 mg total) by mouth 2 (two) times daily.   gabapentin 300 MG capsule Commonly known as: NEURONTIN TAKE 1 CAPSULE BY MOUTH 3 TIMES DAILY   Janumet 50-500 MG tablet Generic drug: sitaGLIPtin-metformin TAKE 1 TABLET BY MOUTH ONCE A DAY   Narcan 4 MG/0.1ML Liqd nasal spray kit Generic drug: naloxone Place 1 spray into the nose daily as needed (For overdose).   omeprazole 40 MG capsule Commonly known as: PRILOSEC TAKE 1 CAPSULE BY MOUTH EVERY DAY What changed: how much to take   oxyCODONE-acetaminophen 10-325 MG tablet Commonly known as: PERCOCET Take  1 tablet by mouth every 4 (four) hours.   predniSONE 20 MG tablet Commonly known as: Deltasone Take 2 tablets (40 mg total) by mouth daily for 7 days. What changed:   medication strength  how much to take  how to take this  when to take this  additional instructions   rosuvastatin 10 MG tablet Commonly known as: CRESTOR Take 10 mg by mouth daily.   Vitamin D (Ergocalciferol) 1.25 MG (50000 UNIT) Caps capsule Commonly known as: DRISDOL TAKE ONE CAPSULE BY MOUTH ONCE WEEKLY What changed: when to take this        Follow-up appointment   6/8 with Rexene Edison, NP at [facility] at 130PM  Discharge Condition:    stable   Physician Statement:   The Patient was personally examined, the discharge assessment and plan has been personally reviewed and I  agree with ACNP Carriann Hesse's assessment and plan. 35 minutes of time have been dedicated to discharge assessment, planning and discharge instructions.   Kennieth Rad, MSN, AGACNP-BC [facility] 06/30/2019, 11:09 AM  See Shea Evans for personal pager PCCM on call pager [phone]

## 2019-06-30 NOTE — Telephone Encounter (Signed)
Spoke with [employer]. After calling us, Dr. Wynona Neat called her back. Nothing further is needed.

## 2019-06-30 NOTE — Care Management Obs Status (Signed)
MEDICARE OBSERVATION STATUS NOTIFICATION   Patient Details  Name: Kailon Treese MRN: [id number] Date of Birth: [birth year]   Medicare Observation Status Notification Given:  Yes    Epifanio Lesches, RN 06/30/2019, 12:05 PM

## 2019-06-30 NOTE — Progress Notes (Signed)
ANTICOAGULATION CONSULT NOTE   Pharmacy Consult for heparin  Indication: chest pain/ACS  Allergies  Allergen Reactions  . Penicillins Anaphylaxis    Has patient had a PCN reaction causing immediate rash, facial/tongue/throat swelling, SOB or lightheadedness with hypotension: Yes Has patient had a PCN reaction causing severe rash involving mucus membranes or skin necrosis: Yes Has patient had a PCN reaction that required hospitalization: Yes Has patient had a PCN reaction occurring within the last 10 years: No If all of the above answers are "NO", then may proceed with Cephalosporin use.   Jannette Spanner [Duloxetine Hcl]     "crazy thoughts"    Patient Measurements: Height: 5\' 7"  (170.2 cm) Weight: 98 kg (216 lb 0.8 oz) IBW/kg (Calculated) : 66.1 Heparin Dosing Weight: 87kg  Vital Signs: Temp: 98 F (36.7 C) (06/06 1852) Temp Source: Oral (06/06 1852) BP: 91/51 (06/07 0200) Pulse Rate: 74 (06/07 0200)  Labs: Recent Labs    06/29/19 1414 06/29/19 1414 06/29/19 1425 06/29/19 1654 06/29/19 2123 06/29/19 2235 06/30/19 0605  HGB 17.9*   < > 18.7*  --   --   --  14.2  HCT 55.8*  --  55.0*  --   --   --  44.3  PLT 278  --   --   --   --   --  199  LABPROT DUE TO ELEVATED HEMATOCRIT, BLOOD:ANTICOAGULANT RATIO IN TUBE IS UNSATISFACTORY. RESULTS MAY BE AFFECTED. PLEASE RECOLLECT IN ANTICOAGULANT-ADJUSTED TUBE, OBTAINABLE FROM THE LAB.  --   --  14.4  --   --   --   INR DUE TO ELEVATED HEMATOCRIT, BLOOD:ANTICOAGULANT RATIO IN TUBE IS UNSATISFACTORY. RESULTS MAY BE AFFECTED. PLEASE RECOLLECT IN ANTICOAGULANT-ADJUSTED TUBE, OBTAINABLE FROM THE LAB.  --   --  1.2  --   --   --   HEPARINUNFRC  --   --   --   --   --   --  0.31  CREATININE 1.82*  --  1.90*  --   --   --   --   TROPONINIHS 42*   < >  --  312* 370* 388*  --    < > = values in this interval not displayed.    Estimated Creatinine Clearance: 47.3 mL/min (A) (by C-G formula based on SCr of 1.9 mg/dL  (H)).   Assessment: 60 you male here with syncope and troponin up to 312. Pharmacy consulted to dose heparin. No anticoagulants noted PTA.   Heparin level therapeutic (0.31) on gtt at 1200 units/hr. No bleeding noted.  Goal of Therapy:  Heparin level 0.3-0.7 units/ml Monitor platelets by anticoagulation protocol: Yes   Plan: -Continue heparin at 1200 units/hr  -Will f/u confirmatory heparin level in 6 hours  41, PharmD, BCPS Please see amion for complete clinical pharmacist phone list 06/30/2019 6:39 AM

## 2019-06-30 NOTE — Progress Notes (Signed)
Patient very eager to discharge. Went over AVS with patient using teachback method. Patient able to verbalize understanding of all discharge instructions. Pt leaving via private vehicle w/ granddaughter. Denies any questions or concerns at this time.

## 2019-06-30 NOTE — Care Management CC44 (Signed)
Condition Code 44 Documentation Completed  Patient Details  Name: Sergei Delo MRN: [id number] Date of Birth: Oct 10, 1960   Condition Code 44 given:  Yes Patient signature on Condition Code 44 notice:  Yes Documentation of 2 MD's agreement:  Yes Code 44 added to claim:  Yes    Epifanio Lesches, RN 06/30/2019, 12:05 PM

## 2019-06-30 NOTE — Discharge Instructions (Signed)
Stop taking bisoprolol, entresto, and lasix today.  Tomorrow, go to pulmonary clinic at 1PM and get your bloodwork done then see Tammy Parrett.  Use 2L oxygen with activity and at night for at least next week.  They will recheck your labs and blood pressure.  If blood pressure better and labs look okay, we will restart some of your meds.  Take steroids and antibiotics that we are prescribing.  Resume your home inhalers, try a flutter valve to help you cough up secretions.  Try to eat and drink as you normally do every day.  Smoking will eventually kill you.  It would be extremely beneficial for your health if you stop.

## 2019-07-01 ENCOUNTER — Other Ambulatory Visit: Payer: Self-pay

## 2019-07-01 ENCOUNTER — Encounter: Payer: Self-pay | Admitting: Adult Health

## 2019-07-01 ENCOUNTER — Ambulatory Visit (INDEPENDENT_AMBULATORY_CARE_PROVIDER_SITE_OTHER): Payer: Medicare HMO | Admitting: Adult Health

## 2019-07-01 VITALS — HR 67 | Temp 97.6°F | Ht 68.0 in | Wt 223.6 lb

## 2019-07-01 DIAGNOSIS — R579 Shock, unspecified: Secondary | ICD-10-CM

## 2019-07-01 DIAGNOSIS — J449 Chronic obstructive pulmonary disease, unspecified: Secondary | ICD-10-CM

## 2019-07-01 DIAGNOSIS — N189 Chronic kidney disease, unspecified: Secondary | ICD-10-CM | POA: Diagnosis not present

## 2019-07-01 DIAGNOSIS — I509 Heart failure, unspecified: Secondary | ICD-10-CM | POA: Insufficient documentation

## 2019-07-01 DIAGNOSIS — F17200 Nicotine dependence, unspecified, uncomplicated: Secondary | ICD-10-CM

## 2019-07-01 DIAGNOSIS — J441 Chronic obstructive pulmonary disease with (acute) exacerbation: Secondary | ICD-10-CM

## 2019-07-01 DIAGNOSIS — J9611 Chronic respiratory failure with hypoxia: Secondary | ICD-10-CM

## 2019-07-01 DIAGNOSIS — I5022 Chronic systolic (congestive) heart failure: Secondary | ICD-10-CM

## 2019-07-01 NOTE — Progress Notes (Signed)
_0  ID: Scott [name], male    DOB: [birth year], 59 y.o.   MRN: [id number]  Chief Complaint  Patient presents with   Follow-up    COPD    Referring provider: Fredrich Romans, [place]  HPI: 59 year old male active smoker followed for severe COPD and chronic hypoxic respiratory failure on oxygen with activity and at bedtime Prone to frequent exacerbations and hospitalizations Complex medical history significant for chronic systolic heart failure, nonischemic cardiomyopathy, diabetes, hypertension, GERD, arthritis  TEST/EVENTS :  2D echo December 2020 EF 30 to 35%, severely decreased function, significant left ventricular septal wall thickening, grade 1 diastolic dysfunction  June 29, 2019 CT head no acute intracranial pathology  June 29, 2019 CTA PE protocol negative for PE.,  Positive for emphysema.  Newly scattered irregular nodular opacities in the lungs largest measuring 1.2 cm likely infectious/inflammatory.  PFTs October 2018 showed FEV1 at 45%, ratio 45, FVC 78%, positive bronchodilator response, severe mid flow airflow obstruction with reversibility, DLCO 61%  07/01/2019 Follow up : COPD , O2 RF ,  Patient returns for a post hospital follow-up.  Patient was discharged yesterday from hospital for a COPD exacerbation, SIRS/hypotension.  Patient was admitted with syncopal episode.  Found to be hypotensive on arrival.  Was treated with IV fluid challenge and vasopressors.  Noted to be in new onset A. fib.  Labs showed AKI, flat troponins.  BNP was 124.  Chest x-ray showed bronchitic/COPD changes.  A CT head was negative for acute changes.  CT chest was negative for PE.  There were new scattered irregular nodular opacities.  Largest measuring 1.2 cm.  Blood pressure improved and was weaned off pressor support.  Renal function improved and had good urine output.  Patient's blood pressure medicine and cardiac meds including Entresto, bisoprolol and Lasix were held.  Since discharge patient is  feeling better.  Blood pressure is trending back up.  Blood pressure today was 152/82.  Weight is up 7 pounds since yesterday (hospital scales versus office scales.  Denies any increased leg swelling.  No orthopnea. Patient was treated with antibiotics and discharged on doxycycline and a steroid taper. Since discharge says he is feeling better.  Is had no syncopal episodes no dizziness.  Breathing is slightly better.  Patient continues to smoke.  Smoking cessation discussed. Patient supposed to be on oxygen 2 L with activity and at bedtime.  Patient did not come to the office with any oxygen O2 saturations on arrival were 87% on room air.  Patient explained to me he does not have any portable oxygen and has not had any portable oxygen tanks or devices.  Order was sent to the DME company for portable system.  O2 saturations on room air walking or 87% on 2 L are greater than 90%.  Covid vaccine utd.     Allergies  Allergen Reactions   Penicillins Anaphylaxis    Has patient had a PCN reaction causing immediate rash, facial/tongue/throat swelling, SOB or lightheadedness with hypotension: Yes Has patient had a PCN reaction causing severe rash involving mucus membranes or skin necrosis: Yes Has patient had a PCN reaction that required hospitalization: Yes Has patient had a PCN reaction occurring within the last 10 years: No If all of the above answers are "NO", then may proceed with Cephalosporin use.    Cymbalta [Duloxetine Hcl]     "crazy thoughts"    Immunization History  Administered Date(s) Administered   Influenza Split 11/24/2010, 12/11/2011   Influenza,inj,Quad PF,6+ Mos 12/11/2014,  10/05/2017   Influenza-Unspecified 11/06/2018   PFIZER SARS-COV-2 Vaccination 04/25/2019, 05/19/2019   Pneumococcal Polysaccharide-23 09/25/2017    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   COPD (chronic obstructive pulmonary  disease) ([place])    Diabetes mellitus without complication (HCC)    GERD (gastroesophageal reflux disease)    Headache(784.0)    Pneumonia    hx    Tobacco History: Social History   Tobacco Use  Smoking Status Current Every Day Smoker   Packs/day: 0.25   Years: 41.00   Pack years: 10.25   Types: Cigarettes   Start date: 01/23/1973  Smokeless Tobacco Never Used  Tobacco Comment   3 ciggs a day   Ready to quit: No Counseling given: Yes Comment: 3 ciggs a day   Outpatient Medications Prior to Visit  Medication Sig Dispense Refill   ADVAIR DISKUS 250-50 MCG/DOSE AEPB INHALE 1 PUFF INTO THE LUNGS TWICE DAILY (Patient taking differently: Inhale 1 puff into the lungs in the morning and at bedtime. ) 60 each 5   albuterol (VENTOLIN HFA) 108 (90 Base) MCG/ACT inhaler Inhale 1-2 puffs into the lungs every 6 (six) hours as needed for wheezing or shortness of breath. 18 g 11   aspirin 325 MG EC tablet Take 81 mg by mouth daily.     COMBIVENT RESPIMAT 20-100 MCG/ACT AERS respimat INHALE 1 PUFF BY MOUTH INTO THE LUNGS FOUR TIMES A DAY (Patient taking differently: Inhale 1 puff into the lungs in the morning, at noon, in the evening, and at bedtime. ) 4 g 0   diazepam (VALIUM) 5 MG tablet Take 5 mg by mouth 2 (two) times daily as needed for anxiety.     doxycycline (ADOXA) 100 MG tablet Take 1 tablet (100 mg total) by mouth 2 (two) times daily. 14 tablet 0   gabapentin (NEURONTIN) 300 MG capsule TAKE 1 CAPSULE BY MOUTH 3 TIMES DAILY (Patient taking differently: Take 300 mg by mouth 3 (three) times daily. ) 90 capsule 0   ipratropium-albuterol (DUONEB) 0.5-2.5 (3) MG/3ML SOLN INHALE THE CONTENTS OF 1 VIAL VIA NEBULIZER EVERY 6 HOURS AS DIRECTED FOR SHORTNESS OF BREATH OR WHEEZING 360 mL 0   JANUMET 50-500 MG tablet TAKE 1 TABLET BY MOUTH ONCE A DAY (Patient taking differently: Take 1 tablet by mouth daily. ) 90 tablet 0   NARCAN 4 MG/0.1ML LIQD nasal spray kit Place 1 spray into  the nose daily as needed (For overdose).      omeprazole (PRILOSEC) 40 MG capsule TAKE 1 CAPSULE BY MOUTH EVERY DAY (Patient taking differently: Take 40 mg by mouth daily. ) 30 capsule 0   oxyCODONE-acetaminophen (PERCOCET) 10-325 MG tablet Take 1 tablet by mouth every 4 (four) hours. 15 tablet 0   predniSONE (DELTASONE) 20 MG tablet Take 2 tablets (40 mg total) by mouth daily for 7 days. 14 tablet 0   rosuvastatin (CRESTOR) 10 MG tablet Take 10 mg by mouth daily.     Vitamin D, Ergocalciferol, (DRISDOL) 1.25 MG (50000 UT) CAPS capsule TAKE ONE CAPSULE BY MOUTH ONCE WEEKLY (Patient taking differently: Take 50,000 Units by mouth every 7 (seven) days. ) 12 capsule 0   No facility-administered medications prior to visit.     Review of Systems:   Constitutional:   No  weight loss, night sweats,  Fevers, chills, + fatigue, or  lassitude.  HEENT:   No headaches,  Difficulty swallowing,  Tooth/dental  problems, or  Sore throat,                No sneezing, itching, ear ache, nasal congestion, post nasal drip,   CV:  No chest pain,  Orthopnea, PND, swelling in lower extremities, anasarca, dizziness, palpitations, syncope.   GI  No heartburn, indigestion, abdominal pain, nausea, vomiting, diarrhea, change in bowel habits, loss of appetite, bloody stools.   Resp: No shortness of breath with exertion or at rest.  No excess mucus, no productive cough,  No non-productive cough,  No coughing up of blood.  No change in color of mucus.  No wheezing.  No chest wall deformity  Skin: no rash or lesions.  GU: no dysuria, change in color of urine, no urgency or frequency.  No flank pain, no hematuria   MS:  No joint pain or swelling.  No decreased range of motion.  No back pain.    Physical Exam  Pulse 67    Temp 97.6 F (36.4 C) (Oral)    Ht _0  (1.727 m)    Wt 223 lb 9.6 oz (101.4 kg)    SpO2 (!) 87%    BMI 34.00 kg/m   GEN: A/Ox3; pleasant , NAD, well nourished    HEENT:  [place]/AT,    NOSE-clear, THROAT-clear, no lesions, no postnasal drip or exudate noted.   NECK:  Supple w/ fair ROM; no JVD; normal carotid impulses w/o bruits; no thyromegaly or nodules palpated; no lymphadenopathy.    RESP  Clear  P & A; w/o, wheezes/ rales/ or rhonchi. no accessory muscle use, no dullness to percussion  CARD:  RRR, no m/r/g, no peripheral edema, pulses intact, no cyanosis or clubbing.  GI:   Soft & nt; nml bowel sounds; no organomegaly or masses detected.   Musco: Warm bil, no deformities or joint swelling noted.   Neuro: alert, no focal deficits noted.    Skin: Warm, no lesions or rashes    Lab Results:  CBC    Component Value Date/Time   WBC 8.8 06/30/2019 0605   RBC 4.80 06/30/2019 0605   HGB 14.2 06/30/2019 0605   HGB 17.9 (H) 09/19/2017 1133   HCT 44.3 06/30/2019 0605   HCT 53.2 (H) 09/19/2017 1133   PLT 199 06/30/2019 0605   PLT 288 09/19/2017 1133   MCV 92.3 06/30/2019 0605   MCV 87 09/19/2017 1133   MCH 29.6 06/30/2019 0605   MCHC 32.1 06/30/2019 0605   RDW 14.4 06/30/2019 0605   RDW 14.2 09/19/2017 1133   LYMPHSABS 1.1 09/20/2018 1040   MONOABS 0.3 09/20/2018 1040   EOSABS 0.1 09/20/2018 1040   BASOSABS 0.0 09/20/2018 1040    BMET    Component Value Date/Time   NA 140 06/30/2019 0605   NA 145 (H) 12/30/2018 1503   K 4.2 06/30/2019 0605   CL 104 06/30/2019 0605   CO2 28 06/30/2019 0605   GLUCOSE 132 (H) 06/30/2019 0605   BUN 19 06/30/2019 0605   BUN 13 12/30/2018 1503   CREATININE 1.35 (H) 06/30/2019 0605   CALCIUM 8.0 (L) 06/30/2019 0605   GFRNONAA 57 (L) 06/30/2019 0605   GFRAA >60 06/30/2019 0605    BNP    Component Value Date/Time   BNP 124.2 (H) 06/29/2019 1414    ProBNP    Component Value Date/Time   PROBNP 45.2 01/19/2013 1654    Imaging: CT Head Wo Contrast  Result Date: 06/29/2019 CLINICAL DATA:  Syncope. EXAM: CT HEAD WITHOUT CONTRAST TECHNIQUE:  Contiguous axial images were obtained from the base of the skull through  the vertex without intravenous contrast. COMPARISON:  None. FINDINGS: Brain: No evidence of acute infarction, hemorrhage, hydrocephalus, extra-axial collection or mass lesion/mass effect. Vascular: No hyperdense vessel or unexpected calcification. Skull: Normal. Negative for fracture or focal lesion. Sinuses/Orbits: No acute finding. Other: None. IMPRESSION: No acute intracranial pathology. Electronically Signed   By: Virgina Norfolk M.D.   On: 06/29/2019 15:09   DG Chest Portable 1 View  Result Date: 06/29/2019 CLINICAL DATA:  Syncopal event, fell going into the bathroom after having a few beers, chest pain; history COPD, diabetes mellitus, hypertension, non ischemic cardiomyopathy EXAM: PORTABLE CHEST 1 VIEW COMPARISON:  Portable exam 1432 hours compared to 09/20/2018 FINDINGS: External pacing leads present. Normal heart size, mediastinal contours, and pulmonary vascularity. Emphysematous and minimal bronchitic changes consistent with COPD. Minimal RIGHT basilar atelectasis. No infiltrate, pleural effusion or pneumothorax. Prior cervical spine surgery. IMPRESSION: COPD changes with minimal RIGHT basilar atelectasis. Electronically Signed   By: Lavonia Dana M.D.   On: 06/29/2019 15:00   CT Angio Chest/Abd/Pel for Dissection W and/or Wo Contrast  Addendum Date: 06/29/2019   ADDENDUM REPORT: 06/29/2019 15:34 ADDENDUM: Addendum is made to complete impression, erroneously omitted from original report: 4. New, scattered irregular nodular opacities of the lungs, the largest in the central right upper lobe measuring 1.2 cm, likely infectious or inflammatory. Non-contrast chest CT at 3-6 months is recommended. If the nodules are stable at time of repeat CT, then future CT at 18-24 months (from today's scan) is considered optional for low-risk patients, but is recommended for high-risk patients. This recommendation follows the consensus statement: Guidelines for Management of Incidental Pulmonary Nodules Detected on  CT Images: From the Fleischner Society 2017; Radiology 2017; 284:228-243. Electronically Signed   By: Eddie Candle M.D.   On: 06/29/2019 15:34   Result Date: 06/29/2019 CLINICAL DATA:  Aortic dissection suspected, hypotensive, possible shock EXAM: CT ANGIOGRAPHY CHEST, ABDOMEN AND PELVIS TECHNIQUE: Non-contrast CT of the chest was initially obtained. Multidetector CT imaging through the chest, abdomen and pelvis was performed using the standard protocol during bolus administration of intravenous contrast. Multiplanar reconstructed images and MIPs were obtained and reviewed to evaluate the vascular anatomy. CONTRAST:  79m OMNIPAQUE IOHEXOL 350 MG/ML SOLN COMPARISON:  05/26/2018 FINDINGS: CTA CHEST FINDINGS Cardiovascular: Preferential opacification of the thoracic aorta. Mild mixed calcific atherosclerosis. Normal contour and caliber of the thoracic aorta without evidence of aneurysm, dissection, or other acute aortic pathology. Normal heart size. Thickening of the left ventricular myocardium, consistent with hypertension. No pericardial effusion. Mediastinum/Nodes: No enlarged mediastinal, hilar, or axillary lymph nodes. Thyroid gland, trachea, and esophagus demonstrate no significant findings. Lungs/Pleura: Moderate centrilobular and paraseptal emphysema. Diffuse bilateral bronchial wall thickening. There are scattered irregular nodular opacities, the largest in the central right upper lobe measuring 1.2 x 0.9 cm, new compared to prior examination (series 7, image 53). No pleural effusion or pneumothorax. Musculoskeletal: No chest wall abnormality. No acute or significant osseous findings. Review of the MIP images confirms the above findings. CTA ABDOMEN AND PELVIS FINDINGS VASCULAR Mild mixed calcific atherosclerosis of the abdominal aorta. Normal contour and caliber without evidence of aneurysm, dissection, or other acute aortic pathology. 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 The branch vessel origins  are patent. Review of the MIP images confirms the above findings. NON-VASCULAR Hepatobiliary: No solid liver abnormality is seen. No gallstones, gallbladder wall thickening, or biliary dilatation. Pancreas: Unremarkable. No pancreatic ductal dilatation or surrounding inflammatory  changes. Spleen: Normal in size without significant abnormality. Adrenals/Urinary Tract: Adrenal glands are unremarkable. Kidneys are normal, without renal calculi, solid lesion, or hydronephrosis. Bladder is unremarkable. Stomach/Bowel: Stomach is within normal limits. Appendix appears normal. There are occasional cecal diverticula, and there is wall thickening and fat stranding about the cecum (series 5, image 208, series 13, image 64). Lymphatic: No enlarged abdominal or pelvic lymph nodes. Reproductive: No mass or other significant abnormality. Other: No abdominal wall hernia or abnormality. No abdominopelvic ascites. Musculoskeletal: No acute or significant osseous findings. Review of the MIP images confirms the above findings. IMPRESSION: 1. No evidence of aortic aneurysm, dissection, or other acute aortic pathology. Mild mixed calcific atherosclerosis. Aortic Atherosclerosis (ICD10-I70.0). 2. Occasional cecal diverticula, and wall thickening fat stranding about the cecum, consistent with nonspecific infectious or inflammatory colitis, possibly although not definitely reflecting diverticulitis. 3.  Emphysema (ICD10-J43.9). These results were called by telephone at the time of interpretation on 06/29/2019 at 3:00 pm to Dr. Sherwood Gambler , who verbally acknowledged these results. Electronically Signed: By: Eddie Candle M.D. On: 06/29/2019 15:15      PFT Results Latest Ref Rng & Units 11/17/2016  FVC-Pre L 2.87  FVC-Predicted Pre % 74  FVC-Post L 3.06  FVC-Predicted Post % 78  Pre FEV1/FVC % % 43  Post FEV1/FCV % % 45  FEV1-Pre L 1.24  FEV1-Predicted Pre % 40  FEV1-Post L 1.39  DLCO UNC% % 61  DLCO COR %Predicted % 69     No results found for: NITRICOXIDE      Assessment & Plan:   COPD with acute exacerbation (HCC) Recent exacerbation now slowly improving.  Patient is to finish his antibiotic and steroids as directed. Smoking cessation was encouraged Plan  Patient Instructions  May restart your cardiac medications including Entresto, bisoprolol and Lasix. Please check blood pressure daily and record.  If systolic blood pressure is less than 100 - please call your cardiologist Low-salt diet.   Continue on Advair 1 puff twice daily, rinse after use  Continue on Combivent 1 puff 4 times daily Please wear oxygen 2 L with activity and at bedtime goal is to keep O2 saturations greater than 88 to 90% Order for portable oxygen tanks at home .  Activity as tolerated Low salt diet  No Smoking .  Follow-up with cardiology next week as planned and as needed Follow up with Dr. Lamonte Sakai  In 4 weeks and As needed   Please contact office for sooner follow up if symptoms do not improve or worsen or seek emergency care        [facility]) Recent SIRS/hypotension questionable etiology possibly from COPD exacerbation - Patient improved with IV hydration and brief vasopressor support.  Blood pressure is trending back up now has had no further syncopal episodes May resume previous cardiac medications including Entresto Lasix and bisoprolol. He is to monitor blood pressure very closely call if blood pressure is less than 998 systolically.  He is to follow-up with cardiology as planned next week  Chronic kidney disease Patient had a bump in his serum creatinine during hospitalization.  Returned back to 1.3 prior to discharge.  We will continue to follow with cardiology.  Congestive heart failure (CHF) (HCC) Chronic systolic heart failure.  Recent hospitalization with hypotension.  Entresto bisoprolol and Lasix were held.  Blood pressure has improved.  Patient may restart his medication.  We will go ahead and  restart Lasix as well as weight has trended back up over the  last 24 hours.  He has no evidence of volume overload on exam he appears to be euvolemic with no excess lower extremity edema.  Have advised him to keep blood pressure log daily.  Reported blood pressure is less than 411 systolically.  Continue on a low-salt diet.  Follow with cardiology in 1 week and as needed  Tobacco use disorder Smoking cessation     Patient care time 45 minutes  Jolita Haefner, NP 07/01/2019

## 2019-07-01 NOTE — Assessment & Plan Note (Signed)
Chronic systolic heart failure.  Recent hospitalization with hypotension.  Entresto bisoprolol and Lasix were held.  Blood pressure has improved.  Patient may restart his medication.  We will go ahead and restart Lasix as well as weight has trended back up over the last 24 hours.  He has no evidence of volume overload on exam he appears to be euvolemic with no excess lower extremity edema.  Have advised him to keep blood pressure log daily.  Reported blood pressure is less than 100 systolically.  Continue on a low-salt diet.  Follow with cardiology in 1 week and as needed

## 2019-07-01 NOTE — Assessment & Plan Note (Signed)
Has no portable oxygen device or tanks  Order to dme for portable oxygen   Plan  Patient Instructions  May restart your cardiac medications including Entresto, bisoprolol and Lasix. Please check blood pressure daily and record.  If systolic blood pressure is less than 100 - please call your cardiologist Low-salt diet.   Continue on Advair 1 puff twice daily, rinse after use  Continue on Combivent 1 puff 4 times daily Please wear oxygen 2 L with activity and at bedtime goal is to keep O2 saturations greater than 88 to 90% Order for portable oxygen tanks at home .  Activity as tolerated Low salt diet  No Smoking .  Follow-up with cardiology next week as planned and as needed Follow up with Dr. Delton Coombes  In 4 weeks and As needed   Please contact office for sooner follow up if symptoms do not improve or worsen or seek emergency care

## 2019-07-01 NOTE — Patient Instructions (Addendum)
May restart your cardiac medications including Entresto, bisoprolol and Lasix. Please check blood pressure daily and record.  If systolic blood pressure is less than 100 - please call your cardiologist Low-salt diet.   Continue on Advair 1 puff twice daily, rinse after use  Continue on Combivent 1 puff 4 times daily Please wear oxygen 2 L with activity and at bedtime goal is to keep O2 saturations greater than 88 to 90% Order for portable oxygen tanks at home .  Activity as tolerated Low salt diet  No Smoking .  Follow-up with cardiology next week as planned and as needed Follow up with Dr. Delton Coombes  In 4 weeks and As needed   Please contact office for sooner follow up if symptoms do not improve or worsen or seek emergency care

## 2019-07-01 NOTE — Assessment & Plan Note (Signed)
Patient had a bump in his serum creatinine during hospitalization.  Returned back to 1.3 prior to discharge.  We will continue to follow with cardiology.

## 2019-07-01 NOTE — Addendum Note (Signed)
Addended by: Sandra Cockayne on: 07/01/2019 02:38 PM   Modules accepted: Orders

## 2019-07-01 NOTE — Assessment & Plan Note (Addendum)
Recent SIRS/hypotension questionable etiology possibly from COPD exacerbation - Patient improved with IV hydration and brief vasopressor support.  Blood pressure is trending back up now has had no further syncopal episodes May resume previous cardiac medications including Entresto Lasix and bisoprolol. He is to monitor blood pressure very closely call if blood pressure is less than 100 systolically.  He is to follow-up with cardiology as planned next week

## 2019-07-01 NOTE — Assessment & Plan Note (Signed)
Recent exacerbation now slowly improving.  Patient is to finish his antibiotic and steroids as directed. Smoking cessation was encouraged Plan  Patient Instructions  May restart your cardiac medications including Entresto, bisoprolol and Lasix. Please check blood pressure daily and record.  If systolic blood pressure is less than 100 - please call your cardiologist Low-salt diet.   Continue on Advair 1 puff twice daily, rinse after use  Continue on Combivent 1 puff 4 times daily Please wear oxygen 2 L with activity and at bedtime goal is to keep O2 saturations greater than 88 to 90% Order for portable oxygen tanks at home .  Activity as tolerated Low salt diet  No Smoking .  Follow-up with cardiology next week as planned and as needed Follow up with Dr. Delton Coombes  In 4 weeks and As needed   Please contact office for sooner follow up if symptoms do not improve or worsen or seek emergency care

## 2019-07-01 NOTE — Assessment & Plan Note (Signed)
Smoking cessation  

## 2019-07-04 LAB — CULTURE, BLOOD (ROUTINE X 2)
Culture: NO GROWTH
Culture: NO GROWTH
Special Requests: ADEQUATE

## 2019-07-09 NOTE — Progress Notes (Signed)
Virtual Visit via Telephone Note   This visit type was conducted due to national recommendations for restrictions regarding the COVID-19 Pandemic (e.g. social distancing) in an effort to limit this patient's exposure and mitigate transmission in our community.  Due to his co-morbid illnesses, this patient is at least at moderate risk for complications without adequate follow up.  This format is felt to be most appropriate for this patient at this time.  The patient did not have access to video technology/had technical difficulties with video requiring transitioning to audio format only (telephone).  All issues noted in this document were discussed and addressed.  No physical exam could be performed with this format.  Please refer to the patient's chart for his  consent to telehealth for [facility].   Date:  07/10/2019   ID:  Scott [name], DOB [birth year], MRN [id number]  Patient Location: Home Provider Location: Office  PCP:  Fredrich Romans, [place] [facility]  Cardiologist:  Minus Breeding, MD  Electrophysiologist:  None   Evaluation Performed:  Follow-Up Visit  Chief Complaint:  Follow Up  History of Present Illness:    Scott [name] is a 59 y.o. male with Scott [name] is a 12  who presents for ongoing assessment and management of nonischemic cardiomyopathy with most recent cardiac catheterization both right and left revealing no angiographic evidence of CAD but findings of volume overload with an EF of 30% to 74%, chronic systolic heart failure, with other history to include chronic emphysema, COPD, and GERD.  He is being followed by pulmonology and has recently been seen by Patricia Nettle, NP after recent hospitalization for COPD exacerbation, SIRS/hypotension.  He was to continue on oxygen via portable system via nasal cannula.  He was counseled on smoking cessation.  He was to continue inhalers as directed.  He was recently in the hospital in early June 2021 due to respiratory failure, COPD  exacerbation, and hypotension.  At that time Entresto, Lasix, and bisoprolol was discontinued.  Today he states he feels weak very tired has no energy.  He started back on Entresto Lasix and bisoprolol on his own.  Blood pressure 120/80 prior to taking his medications this morning.  He denies any lower extremity edema or bloating.  He is uncertain what his heart rate is.  He is due to see his PCP tomorrow at which time he will bring his blood pressure machine with him and will also get an accurate weight.  His weight this morning was 223 pounds.  That is close to his baseline.  The patient does not have symptoms concerning for COVID-19 infection (fever, chills, cough, or new shortness of breath).  He has had both of his vaccine for coronavirus.   Past Medical History:  Diagnosis Date  . 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 COPD (chronic obstructive pulmonary disease) ([place])   . Diabetes mellitus without complication ([place])   . GERD (gastroesophageal reflux disease)   . Headache(784.0)   . Pneumonia    hx   Past Surgical History:  Procedure Laterality Date  . ANTERIOR CERVICAL DECOMP/DISCECTOMY FUSION  12/22/2010   Procedure: ANTERIOR CERVICAL DECOMPRESSION/DISCECTOMY FUSION 3 LEVELS;  Surgeon: Ophelia Charter;  Location: [place] [facility];  Service: Neurosurgery;  Laterality: N/A;  Anterior cervical discectomy with fusion cervical three-four, four-five, and five sixCDF with Interbody Prosthesis, plating, and Bone Graft   . ANTERIOR CERVICAL DECOMP/DISCECTOMY FUSION N/A 10/16/2012   Procedure: CERVICAL SIX-SEVEN ANTERIOR CERVICAL DECOMPRESSION/DISCECTOMY FUSION  WITH INTERBODY PROTHESIS PLATING BONEGRAFT WITH /POSSIBLE HARDWARE REMOVAL OLD PLATE;  Surgeon: Ophelia Charter, MD;  Location: [place] [facility];  Service: Neurosurgery;  Laterality: N/A;  . MULTIPLE TOOTH EXTRACTIONS    . OTHER SURGICAL HISTORY     surgery on cheekbone and head for fall 2000  . 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 RIGHT/LEFT HEART CATH AND CORONARY ANGIOGRAPHY N/A 09/23/2018   Procedure: RIGHT/LEFT HEART CATH AND CORONARY ANGIOGRAPHY and possible PCI/stent;  Surgeon: MBurnell Blanks MD;  [facility];  Service: Cardiovascular;  Laterality: N/A;  . SPINE SURGERY     2014 and 2016 - Dr JArnoldo Morale . TONSILLECTOMY       Current Meds  Medication Sig  . ADVAIR DISKUS 250-50 MCG/DOSE AEPB INHALE 1 PUFF INTO THE LUNGS TWICE DAILY (Patient taking differently: Inhale 1 puff into the lungs in the morning and at bedtime. )  . albuterol (VENTOLIN HFA) 108 (90 Base) MCG/ACT inhaler Inhale 1-2 puffs into the lungs every 6 (six) hours as needed for wheezing or shortness of breath.  .Marland Kitchenaspirin 325 MG EC tablet Take 81 mg by mouth daily.  . COMBIVENT RESPIMAT 20-100 MCG/ACT AERS respimat INHALE 1 PUFF BY MOUTH INTO THE LUNGS FOUR TIMES A DAY (Patient taking differently: Inhale 1 puff into the lungs in the morning, at noon, in the evening, and at bedtime. )  . diazepam (VALIUM) 5 MG tablet Take 5 mg by mouth 2 (two) times daily as needed for anxiety.  . gabapentin (NEURONTIN) 300 MG capsule TAKE 1 CAPSULE BY MOUTH 3 TIMES DAILY (Patient taking differently: Take 300 mg by mouth 3 (three) times daily. )  . ipratropium-albuterol (DUONEB) 0.5-2.5 (3) MG/3ML SOLN INHALE THE CONTENTS OF 1 VIAL VIA NEBULIZER EVERY 6 HOURS AS DIRECTED FOR SHORTNESS OF BREATH OR WHEEZING  . JANUMET 50-500 MG tablet TAKE 1 TABLET BY MOUTH ONCE A DAY (Patient taking differently: Take 1 tablet by mouth daily. )  . NARCAN 4 MG/0.1ML LIQD nasal spray kit Place 1 spray into the nose daily as needed (For overdose).   .Marland Kitchenomeprazole (PRILOSEC) 40 MG capsule TAKE 1 CAPSULE BY MOUTH EVERY DAY (Patient taking differently: Take 40 mg by mouth daily. )  . oxyCODONE-acetaminophen (PERCOCET) 10-325 MG tablet Take 1 tablet by mouth  every 4 (four) hours.  . rosuvastatin (CRESTOR) 10 MG tablet Take 10 mg by mouth daily.  . Vitamin D, Ergocalciferol, (DRISDOL) 1.25 MG (50000 UT) CAPS capsule TAKE ONE CAPSULE BY MOUTH ONCE WEEKLY (Patient taking differently: Take 50,000 Units by mouth every 7 (seven) days. )     Allergies:   Penicillins and Cymbalta [duloxetine hcl]   Social History   Tobacco Use  . Smoking status: Current Every Day Smoker    Packs/day: 0.25    Years: 41.00    Pack years: 10.25    Types: Cigarettes    Start date: 01/23/1973  . Smokeless tobacco: Never Used  . Tobacco comment: 3 ciggs a day  Vaping Use  . Vaping Use: Never used  Substance Use Topics  . Alcohol use: Yes    Comment: Beer occasionally  . Drug use: No     Family Hx: The patient's family history includes Asthma in his brother and sister; COPD in his mother; Diabetes in his brother; Emphysema in his mother; Heart  disease in his maternal uncle; Hyperlipidemia in his brother and sister; Hypertension in his brother, brother, and sister; Mental illness in his sister.  ROS:   Please see the history of present illness.    All other systems reviewed and are negative.   Prior CV studies:   The following studies were reviewed today:  Echocardiogram 12/26/2018 1. Left ventricular ejection fraction, by visual estimation, is 30 to  35%. The left ventricle has moderate to severely decreased function. Left  ventricular septal wall thickness was moderately increased. Moderately  increased left ventricular posterior  wall thickness. There is moderately increased left ventricular  hypertrophy.  2. Left ventricular diastolic parameters are consistent with Grade I  diastolic dysfunction (impaired relaxation).  3. The left ventricle demonstrates global hypokinesis.  4. Global right ventricle has mildly reduced systolic function.The right  ventricular size is normal. No increase in right ventricular wall  thickness.  5. Left atrial size was  normal.  6. Right atrial size was normal.  7. The mitral valve is normal in structure. No evidence of mitral valve  regurgitation. No evidence of mitral stenosis.  8. The tricuspid valve is normal in structure. Tricuspid valve  regurgitation is not demonstrated.  9. The aortic valve is tricuspid. Aortic valve regurgitation is not  visualized. No evidence of aortic valve sclerosis or stenosis.  10. The pulmonic valve was normal in structure. Pulmonic valve  regurgitation is not visualized.  11. TR signal is inadequate for assessing pulmonary artery systolic  pressure.  12. The inferior vena cava is normal in size with greater than 50%  respiratory variability, suggesting right atrial pressure of 3 mmHg.  13. The average left ventricular global longitudinal strain is -9.2 %.   Labs/Other Tests and Data Reviewed:    EKG:  No ECG reviewed.  (Telephone visit)  Recent Labs: 06/29/2019: ALT 15; B Natriuretic Peptide 124.2 06/30/2019: BUN 19; Creatinine, Ser 1.35; Hemoglobin 14.2; Magnesium 1.9; Platelets 199; Potassium 4.2; Sodium 140   Recent Lipid Panel Lab Results  Component Value Date/Time   CHOL 161 03/01/2018 09:58 AM   TRIG 140 05/26/2018 12:09 PM   HDL 40 03/01/2018 09:58 AM   CHOLHDL 4.0 03/01/2018 09:58 AM   LDLCALC 89 03/01/2018 09:58 AM    Wt Readings from Last 3 Encounters:  07/10/19 223 lb (101.2 kg)  07/01/19 223 lb 9.6 oz (101.4 kg)  06/30/19 216 lb 0.8 oz (98 kg)     Objective:    Vital Signs:  BP 120/80   Ht _0  (1.727 m)   Wt 223 lb (101.2 kg)   BMI 33.91 kg/m    VITAL SIGNS:  reviewed GEN:  no acute distress RESPIRATORY:  Slightly labored over the phone without wheezing or coughing NEURO:  alert and oriented x 3, no obvious focal deficit PSYCH:  Appears depressed  ASSESSMENT & PLAN:    1.  Nonischemic cardiomyopathy: Most recent echocardiogram revealing EF of 30 to 35%.  Recent hospitalization for COPD exacerbation and hypotension.  Entresto,  Lasix, and bisoprolol will held on discharge.  The patient started taking them when he returned home.  Blood pressure today is 120/80 prior to taking medications.  I have advised him to return to taking Entresto 24/26 mg twice daily, decrease Lasix to 20 mg daily, and hold off on bisoprolol.  I want to see him on follow-up in 1 month in person.  He is to continue to take his blood pressure daily and report any blood pressures less than  712 systolic.  2.  Oxygen dependent COPD: He is now on oxygen via nasal cannula at 2 L.  This is followed by pulmonology.  They have requested a repeat BMET which will be drawn on 07/16/2019.  I will review copy when it is available.  3.  Generalized fatigue and malaise: Deconditioning associated with chronic illness.  He is due to see his PCP tomorrow.  May need to be considered for physical therapy or pulmonary rehab.  COVID-19 Education: The signs and symptoms of COVID-19 were discussed with the patient and how to seek care for testing (follow up with PCP or arrange E-visit).  The importance of social distancing was discussed today.  He verbalizes understanding.  He has had both of his Covid vaccines but remains socially distant and stays at home for the most part.  Time:   Today, I have spent 30 minutes with the patient with telehealth technology discussing the above problems.     Medication Adjustments/Labs and Tests Ordered: Current medicines are reviewed at length with the patient today.  Concerns regarding medicines are outlined above.   Tests Ordered: No orders of the defined types were placed in this encounter.   Medication Changes: No orders of the defined types were placed in this encounter.   Disposition:  Follow up one month  Signed, Phill Myron. West Pugh, ANP, AACC  07/10/2019 8:14 AM    [place]

## 2019-07-10 ENCOUNTER — Encounter: Payer: Self-pay | Admitting: Adult Health

## 2019-07-10 ENCOUNTER — Telehealth (INDEPENDENT_AMBULATORY_CARE_PROVIDER_SITE_OTHER): Payer: Medicare HMO | Admitting: Adult Health

## 2019-07-10 VITALS — BP 120/80 | Ht 68.0 in | Wt 223.0 lb

## 2019-07-10 DIAGNOSIS — I428 Other cardiomyopathies: Secondary | ICD-10-CM

## 2019-07-10 DIAGNOSIS — I5022 Chronic systolic (congestive) heart failure: Secondary | ICD-10-CM | POA: Diagnosis not present

## 2019-07-10 DIAGNOSIS — J449 Chronic obstructive pulmonary disease, unspecified: Secondary | ICD-10-CM | POA: Diagnosis not present

## 2019-07-10 DIAGNOSIS — Z9981 Dependence on supplemental oxygen: Secondary | ICD-10-CM

## 2019-07-10 MED ORDER — ENTRESTO 24-26 MG PO TABS: 1.0000 | ORAL_TABLET | Freq: Two times a day (BID) | ORAL | 11 refills | Status: DC

## 2019-07-10 MED ORDER — FUROSEMIDE 20 MG PO TABS
20.0000 mg | ORAL_TABLET | Freq: Every day | ORAL | 3 refills | Status: DC
Start: 2019-07-10 — End: 2019-07-16

## 2019-07-10 NOTE — Patient Instructions (Signed)
Medication Instructions:  RESTART- Entresto 24/26 mg by mouth twice a day RESTART- Furosemide(Lasix) 20 mg by mouth daily  *If you need a refill on your cardiac medications before your next appointment, please call your pharmacy*   Lab Work: None Ordered   Testing/Procedures: None Ordered   Follow-Up: At [facility], you and your health needs are our priority.  As part of our continuing mission to provide you with exceptional heart care, we have created designated Provider Care Teams.  These Care Teams include your primary Cardiologist (physician) and Advanced Practice Providers (APPs -  Physician Assistants and Nurse Practitioners) who all work together to provide you with the care you need, when you need it.  We recommend signing up for the patient portal called "MyChart".  Sign up information is provided on this After Visit Summary.  MyChart is used to connect with patients for Virtual Visits (Telemedicine).  Patients are able to view lab/test results, encounter notes, upcoming appointments, etc.  Non-urgent messages can be sent to your provider as well.   To learn more about what you can do with MyChart, go to [email or web address].    Your next appointment:   Monday July 19th @ 8:15 am  The format for your next appointment:   In Person  Provider:   Joni Reining, DNP, ANP

## 2019-07-16 ENCOUNTER — Other Ambulatory Visit: Payer: Self-pay | Admitting: Cardiology

## 2019-07-16 ENCOUNTER — Encounter: Payer: Self-pay | Admitting: Emergency Medicine

## 2019-07-16 ENCOUNTER — Ambulatory Visit (INDEPENDENT_AMBULATORY_CARE_PROVIDER_SITE_OTHER): Payer: Medicare HMO | Admitting: Emergency Medicine

## 2019-07-16 ENCOUNTER — Other Ambulatory Visit: Payer: Self-pay

## 2019-07-16 DIAGNOSIS — F17200 Nicotine dependence, unspecified, uncomplicated: Secondary | ICD-10-CM

## 2019-07-16 DIAGNOSIS — J301 Allergic rhinitis due to pollen: Secondary | ICD-10-CM | POA: Diagnosis not present

## 2019-07-16 DIAGNOSIS — J9611 Chronic respiratory failure with hypoxia: Secondary | ICD-10-CM | POA: Diagnosis not present

## 2019-07-16 DIAGNOSIS — J449 Chronic obstructive pulmonary disease, unspecified: Secondary | ICD-10-CM

## 2019-07-16 MED ORDER — FUROSEMIDE 20 MG PO TABS: 20.0000 mg | ORAL_TABLET | Freq: Every day | ORAL | 3 refills | Status: DC

## 2019-07-16 MED ORDER — ENTRESTO 24-26 MG PO TABS: 1.0000 | ORAL_TABLET | Freq: Two times a day (BID) | ORAL | 3 refills | Status: DC

## 2019-07-16 NOTE — Progress Notes (Signed)
  Subjective:    Patient ID: Scott [name], male    DOB: November 23, 1960, 59 y.o.   MRN: [id number]  HPI  ROV 07/16/19 --59 year old man follows up for his history of tobacco use, COPD with severe obstruction by spirometry, exertional hypoxemia.  Also with some allergies, GERD they contribute to upper airway irritability and cough, hypertension with diastolic CHF.  Recent exacerbation beginning of June, started with nausea/ emesis, treated with antibiotics steroids. COVID negative. He has been vaccinated.  Continues to smoke approximately 3-4 cig a day. Just started back on Wellbutrin 2 months ago. Considering starting a quit date.  Managed on Advair, Combivent on a schedule (or DuoNeb if he is at home). Uses ventolin 2-3x a day.  Needs his meds changed over to [facility].  Supposed to be on oxygen at 2 L/min with activity hands while sleeping. He wants a POC, has portable tanks - doesn't use reliably.      No flowsheet data found.  Objective:   Physical Exam  Vitals:   07/16/19 0900  BP: 132/80  Pulse: (!) 110  Temp: 98.1 F (36.7 C)  TempSrc: Oral  SpO2: 93%  Weight: 210 lb (95.3 kg)  Height: 5\' 8"  (1.727 m)   Gen: Pleasant, well-nourished, in no distress  ENT: No lesions,  mouth clear,  oropharynx clear, no postnasal drip,   Neck: No JVD, no stridor  Lungs: mostly clear, soft end exp wheeze B L>R  Cardiovascular: RRR, heart sounds normal, no murmur or gallops, trace peripheral edema  Musculoskeletal: No deformities, no cyanosis or clubbing  Neuro: alert, appropriate, non-focal  Skin: Warm     Assessment & Plan:  COPD (chronic obstructive pulmonary disease)  Continue Advair twice a day.  Rinse and gargle after using. Continue Combivent 2 puffs 4 times a day on a schedule.  Substitute DuoNeb when you are at home Keep your Ventolin available to use 2 puffs when you need it for shortness of breath, chest tightness, wheezing. COVID-19 vaccine up-to-date Follow with Dr in 4  months or sooner if you have any problems.  Allergic rhinitis Continue same medications  Chronic respiratory failure with hypoxia (HCC) Marginal compliance with his O2.  Encouraged him to continue to wear his submental oxygen 2 L/min with exertion and while sleeping.  Tobacco use disorder Discussed smoking cessation in detail with him today.  He has had some success cutting down since he restarted Wellbutrin.  We can consider setting a quit date at his next office visit.   Delton Coombes, MD, PhD 07/16/2019, 9:20 AM [facility] [phone] or if no answer [phone]

## 2019-07-16 NOTE — Assessment & Plan Note (Signed)
  Continue Advair twice a day.  Rinse and gargle after using. Continue Combivent 2 puffs 4 times a day on a schedule.  Substitute DuoNeb when you are at home Keep your Ventolin available to use 2 puffs when you need it for shortness of breath, chest tightness, wheezing. COVID-19 vaccine up-to-date Follow with Dr Delton Coombes in 4 months or sooner if you have any problems.

## 2019-07-16 NOTE — Assessment & Plan Note (Signed)
Continue same medications.

## 2019-07-16 NOTE — Assessment & Plan Note (Signed)
Discussed smoking cessation in detail with him today.  He has had some success cutting down since he restarted Wellbutrin.  We can consider setting a quit date at his next office visit.

## 2019-07-16 NOTE — Assessment & Plan Note (Signed)
Marginal compliance with his O2.  Encouraged him to continue to wear his submental oxygen 2 L/min with exertion and while sleeping.

## 2019-07-16 NOTE — Patient Instructions (Signed)
Agree with addition of Wellbutrin. Please continue to work on decreasing your cigarettes.  We can talk about possibly setting a quit date when we follow-up at your next visit. Continue Advair twice a day.  Rinse and gargle after using. Continue Combivent 2 puffs 4 times a day on a schedule.  Substitute DuoNeb when you are at home Keep your Ventolin available to use 2 puffs when you need it for shortness of breath, chest tightness, wheezing. Wear your oxygen at 2 L/min with exertion and while sleeping. COVID-19 vaccine up-to-date Follow with Dr Delton Coombes in 4 months or sooner if you have any problems.

## 2019-07-16 NOTE — Telephone Encounter (Signed)
*  STAT* If patient is at the pharmacy, call can be transferred to refill team.   1. Which medications need to be refilled? (please list name of each medication and dose if known) furosemide (LASIX) 20 MG tablet / sacubitril-valsartan (ENTRESTO) 24-26 MG  2. Which pharmacy/location (including street and city if local pharmacy) is medication to be sent to? [facility]  3. Do they need a 30 day or 90 day supply? 90

## 2019-07-17 ENCOUNTER — Other Ambulatory Visit: Payer: Self-pay

## 2019-07-17 NOTE — Patient Outreach (Signed)
[facility]) Care Management  07/17/2019  Scott [name] [birth year] [id number]   Telephone Assessment    Unsuccessful outreach attempt to patient.       Plan: RN CM will make outreach attempt to patient within the month of August.   Tauheed Mcfayden Magda Paganini [facility] Care Management Telephonic Care Management Coordinator Direct Phone: [phone] Toll Free: [phone] Fax: [phone]

## 2019-07-21 ENCOUNTER — Other Ambulatory Visit: Payer: Self-pay

## 2019-07-23 ENCOUNTER — Telehealth: Payer: Self-pay | Admitting: Emergency Medicine

## 2019-07-23 ENCOUNTER — Telehealth: Payer: Self-pay | Admitting: Cardiology

## 2019-07-23 DIAGNOSIS — J449 Chronic obstructive pulmonary disease, unspecified: Secondary | ICD-10-CM

## 2019-07-23 NOTE — Telephone Encounter (Signed)
Patient requesting refills of both bisoprolol and metoprolol succinate.  Based on Kathryn's last office note (07/10/19) bisoprolol was to be held until follow-up visit in July. Metoprolol was discontinued in November 2020.   Attempted to discuss this w/ patient. LMTCB

## 2019-07-23 NOTE — Telephone Encounter (Signed)
No refills on bisoprolol until being seen in the office.  Thanks!  KL

## 2019-07-23 NOTE — Telephone Encounter (Signed)
New message   *STAT* If patient is at the pharmacy, call can be transferred to refill team.   1. Which medications need to be refilled? (please list name of each medication and dose if known)   Bisoprolol 5 mg Metoprolol (extended release) 25 mg  2. Which pharmacy/location (including street and city if local pharmacy) is medication to be sent to? [facility] - [place], [place] - [address]  3. Do they need a 30 day or 90 day supply? 90 day

## 2019-07-23 NOTE — Telephone Encounter (Signed)
ATC pt, no answer. Left message for pt to call back.  

## 2019-07-24 ENCOUNTER — Other Ambulatory Visit: Payer: Self-pay

## 2019-07-24 DIAGNOSIS — J449 Chronic obstructive pulmonary disease, unspecified: Secondary | ICD-10-CM

## 2019-07-24 MED ORDER — ALBUTEROL SULFATE HFA 108 (90 BASE) MCG/ACT IN AERS: 1.0000 | INHALATION_SPRAY | Freq: Four times a day (QID) | RESPIRATORY_TRACT | 3 refills | Status: DC | PRN

## 2019-07-24 MED ORDER — COMBIVENT RESPIMAT 20-100 MCG/ACT IN AERS: INHALATION_SPRAY | RESPIRATORY_TRACT | 3 refills | Status: DC

## 2019-07-24 MED ORDER — VENTOLIN HFA 108 (90 BASE) MCG/ACT IN AERS: 1.0000 | INHALATION_SPRAY | Freq: Four times a day (QID) | RESPIRATORY_TRACT | 1 refills | Status: DC | PRN

## 2019-07-24 MED ORDER — ADVAIR DISKUS 250-50 MCG/DOSE IN AEPB: INHALATION_SPRAY | RESPIRATORY_TRACT | 1 refills | Status: DC

## 2019-07-24 NOTE — Addendum Note (Signed)
Addended by: Velvet Bathe on: 07/24/2019 02:27 PM   Modules accepted: Orders

## 2019-07-24 NOTE — Telephone Encounter (Signed)
Rxs have been sent in as DAW. Pt is aware. Nothing further was needed.

## 2019-07-30 ENCOUNTER — Ambulatory Visit: Admission: RE | Admit: 2019-07-30 | Payer: Medicare HMO | Source: Ambulatory Visit

## 2019-07-30 ENCOUNTER — Other Ambulatory Visit: Payer: Self-pay

## 2019-07-30 ENCOUNTER — Encounter: Payer: Self-pay | Admitting: Acute Care

## 2019-07-30 ENCOUNTER — Ambulatory Visit (INDEPENDENT_AMBULATORY_CARE_PROVIDER_SITE_OTHER): Payer: Medicare HMO | Admitting: Acute Care

## 2019-07-30 DIAGNOSIS — Z122 Encounter for screening for malignant neoplasm of respiratory organs: Secondary | ICD-10-CM

## 2019-07-30 DIAGNOSIS — F17210 Nicotine dependence, cigarettes, uncomplicated: Secondary | ICD-10-CM | POA: Diagnosis not present

## 2019-07-30 NOTE — Patient Instructions (Signed)
Thank you for participating in the [facility] Program. It was our pleasure to meet you today. We will call you with the results of your scan within the next few days. Your scan will be assigned a Lung RADS category score by the physicians reading the scans.  This Lung RADS score determines follow up scanning.  See below for description of categories, and follow up screening recommendations. We will be in touch to schedule your follow up screening annually or based on recommendations of our providers. We will fax a copy of your scan results to your Primary Care Physician, or the physician who referred you to the program, to ensure they have the results. Please call the office if you have any questions or concerns regarding your scanning experience or results.  Our office number is [phone]. Please speak with Denise Phelps, RN. She is our Lung Cancer Screening RN. If she is unavailable when you call, please have the office staff send her a message. She will return your call at her earliest convenience. Remember, if your scan is normal, we will scan you annually as long as you continue to meet the criteria for the program. (Age 58-77, Current smoker or smoker who has quit within the last 15 years). If you are a smoker, remember, quitting is the single most powerful action that you can take to decrease your risk of lung cancer and other pulmonary, breathing related problems. We know quitting is hard, and we are here to help.  Please let us know if there is anything we can do to help you meet your goal of quitting. If you are a former smoker, congratulations. We are proud of you! Remain smoke free! Remember you can refer friends or family members through the number above.  We will screen them to make sure they meet criteria for the program. Thank you for helping us take better care of you by participating in Lung Screening.  Lung RADS Categories:  Lung RADS 1: no nodules  or definitely non-concerning nodules.  Recommendation is for a repeat annual scan in 12 months.  Lung RADS 2:  nodules that are non-concerning in appearance and behavior with a very low likelihood of becoming an active cancer. Recommendation is for a repeat annual scan in 12 months.  Lung RADS 3: nodules that are probably non-concerning , includes nodules with a low likelihood of becoming an active cancer.  Recommendation is for a 6-month repeat screening scan. Often noted after an upper respiratory illness. We will be in touch to make sure you have no questions, and to schedule your 6-month scan.  Lung RADS 4 A: nodules with concerning findings, recommendation is most often for a follow up scan in 3 months or additional testing based on our provider's assessment of the scan. We will be in touch to make sure you have no questions and to schedule the recommended 3 month follow up scan.  Lung RADS 4 B:  indicates findings that are concerning. We will be in touch with you to schedule additional diagnostic testing based on our provider's  assessment of the scan.   

## 2019-07-30 NOTE — Progress Notes (Signed)
Shared Decision Making Visit Lung Cancer Screening Program [phone])   Eligibility:  Age 59 y.o.  Pack Years Smoking History Calculation 35 pack year smoking history (# packs/per year x # years smoked)  Recent History of coughing up blood  no  Unexplained weight loss? no ( >Than 15 pounds within the last 6 months )  Prior History Lung / other cancer no (Diagnosis within the last 5 years already requiring surveillance chest CT Scans).  Smoking Status Current Smoker  Former Smokers: Years since quit:NA  Quit Date: NA  Visit Components:  Discussion included one or more decision making aids. yes  Discussion included risk/benefits of screening. yes  Discussion included potential follow up diagnostic testing for abnormal scans. yes  Discussion included meaning and risk of over diagnosis. yes  Discussion included meaning and risk of False Positives. yes  Discussion included meaning of total radiation exposure. yes  Counseling Included:  Importance of adherence to annual lung cancer LDCT screening. yes  Impact of comorbidities on ability to participate in the program. yes  Ability and willingness to under diagnostic treatment. yes  Smoking Cessation Counseling:  Current Smokers:   Discussed importance of smoking cessation. yes  Information about tobacco cessation classes and interventions provided to patient. yes  Patient provided with "ticket" for LDCT Scan. yes  Symptomatic Patient. no  Counseling NA  Diagnosis Code: Tobacco Use Z72.0  Asymptomatic Patient yes  Counseling (Intermediate counseling: > three minutes counseling) U5427  Former Smokers:   Discussed the importance of maintaining cigarette abstinence. yes  Diagnosis Code: Personal History of Nicotine Dependence. C62.376  Information about tobacco cessation classes and interventions provided to patient. Yes  Patient provided with "ticket" for LDCT Scan. yes  Written Order for Lung Cancer  Screening with LDCT placed in Epic. Yes (CT Chest Lung Cancer Screening Low Dose W/O CM) [id number] Z12.2-Screening of respiratory organs Z87.891-Personal history of nicotine dependence  Pt is scheduled for his scan 08/12/2019. He had to reschedule as he is not feeling well today.   I have spent 25 minutes of face to face time with Scott [name] discussing the risks and benefits of lung cancer screening. We viewed a power point together that explained in detail the above noted topics. We paused at intervals to allow for questions to be asked and answered to ensure understanding.We discussed that the single most powerful action that he can take to decrease his risk of developing lung cancer is to quit smoking. We discussed whether or not he is ready to commit to setting a quit date. We discussed options for tools to aid in quitting smoking including nicotine replacement therapy, non-nicotine medications, support groups, Quit Smart classes, and behavior modification. We discussed that often times setting smaller, more achievable goals, such as eliminating 1 cigarette a day for a week and then 2 cigarettes a day for a week can be helpful in slowly decreasing the number of cigarettes smoked. This allows for a sense of accomplishment as well as providing a clinical benefit. I gave him the " Be Stronger Than Your Excuses" card with contact information for community resources, classes, free nicotine replacement therapy, and access to mobile apps, text messaging, and on-line smoking cessation help. I have also given him my card and contact information in the event he needs to contact me. We discussed the time and location of the scan, and that either [name] RN or I will call with the results within 24-48 hours of receiving them. I have offered him  a copy of the power point we viewed  as a resource in the event they need reinforcement of the concepts we discussed today in the office. The patient verbalized  understanding of all of  the above and had no further questions upon leaving the office. They have my contact information in the event they have any further questions.  I spent 4 minutes counseling on smoking cessation and the health risks of continued tobacco abuse.  I explained to the patient that there has been a high incidence of coronary artery disease noted on these exams. I explained that this is a non-gated exam therefore degree or severity cannot be determined. This patient is currently on statin therapy. I have asked the patient to follow-up with their PCP regarding any incidental finding of coronary artery disease and management with diet or medication as their PCP  feels is clinically indicated. The patient verbalized understanding of the above and had no further questions upon completion of the visit.   Bevelyn Ngo, NP 07/30/2019

## 2019-07-31 ENCOUNTER — Other Ambulatory Visit: Payer: Self-pay | Admitting: *Deleted

## 2019-07-31 NOTE — Telephone Encounter (Signed)
Spoke with pt about Joni Reining recommendation and about his upcoming office visit, pt voice thanks for the call back

## 2019-08-04 ENCOUNTER — Telehealth: Payer: Self-pay | Admitting: Emergency Medicine

## 2019-08-04 NOTE — Telephone Encounter (Signed)
Spoke with patient and verified he wanted Ventolin inhaler sent to [facility] pharmacy.  Spoke with Juanetta Gosling and she verified a 3 month supply was shipped on 07/31/19.  Nothing further needed.

## 2019-08-10 NOTE — Progress Notes (Signed)
Cardiology Office Note   Date:  08/11/2019   ID:  Scott [name], DOB Aug 26, 1960, MRN [id number]  PCP:  Fredrich Romans, PA  Cardiologist: Dr.Hochrein  CC: Follow Up  History of Present Illness: Scott [name] is a 59 y.o. male who presents for ongoing assessment and management of nonischemic cardiomyopathy with most recent cardiac catheterization both right and left revealing no angiographic evidence of CAD but findings of volume overload with an EF of 30% to 32%, chronic systolic heart failure, with other history to include chronic emphysema, COPD, and GERD.  He is being followed by pulmonology and has recently been seen by Patricia Nettle, NP after recent hospitalization for COPD exacerbation, SIRS/hypotension.  He was to continue on oxygen via portable system via nasal cannula.  He was counseled on smoking cessation.  He was to continue inhalers as directed.  He was recently in the hospital in early June 2021 due to respiratory failure, COPD exacerbation, and hypotension.  At that time Entresto, Lasix, and bisoprolol were discontinued.  On last office visit date 07/10/2019, he was advised to take Entresto 24/26 mg twice daily, decrease Lasix to 20 mg daily, and hold off on bisoprolol.  I wanted to see him on follow-up in 1 month in person.  He is to continue to take his blood pressure daily and report any blood pressures less than 671 systolic.  He comes today with no complaints at this time.  He states that he feels great.  Weight is up approximately 5 pounds since last weight on 07/16/2019.  He admitted to eating some pizza yesterday.  He denies PND, orthopnea, lower extremity edema, chest discomfort, or significant dyspnea on exertion.  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   COPD (chronic obstructive pulmonary disease) ([place])    Diabetes mellitus without complication ([place])    GERD (gastroesophageal reflux disease)     Headache(784.0)    Pneumonia    hx    Past Surgical History:  Procedure Laterality Date   ANTERIOR CERVICAL DECOMP/DISCECTOMY FUSION  12/22/2010   Procedure: ANTERIOR CERVICAL DECOMPRESSION/DISCECTOMY FUSION 3 LEVELS;  Surgeon: Ophelia Charter;  Location: [facility];  Service: Neurosurgery;  Laterality: N/A;  Anterior cervical discectomy with fusion cervical three-four, four-five, and five sixCDF with Interbody Prosthesis, plating, and Bone Graft    ANTERIOR CERVICAL DECOMP/DISCECTOMY FUSION N/A 10/16/2012   Procedure: CERVICAL SIX-SEVEN ANTERIOR CERVICAL DECOMPRESSION/DISCECTOMY FUSION WITH INTERBODY PROTHESIS PLATING BONEGRAFT WITH /POSSIBLE HARDWARE REMOVAL OLD PLATE;  Surgeon: Ophelia Charter, MD;  Location: [facility];  Service: Neurosurgery;  Laterality: N/A;   MULTIPLE TOOTH EXTRACTIONS     OTHER SURGICAL HISTORY     surgery on cheekbone and head for fall 2000   OTHER SURGICAL HISTORY     states when about 59yr old he was urinating blood, 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     buPROPion (WELLBUTRIN SR) 150 MG 12 hr tablet      diazepam (VALIUM) 5 MG tablet Take 5 mg by mouth 2 (two) times daily as needed for anxiety.     fluticasone (FLONASE) 50 MCG/ACT nasal spray Place 2 sprays into both nostrils daily.     furosemide (LASIX) 20 MG tablet Take 1 tablet (20 mg total) by mouth daily. 90 tablet 3    gabapentin (NEURONTIN) 300 MG capsule TAKE 1 CAPSULE BY MOUTH 3 TIMES DAILY (Patient taking differently: Take 300 mg by mouth 3 (three) times daily. ) 90 capsule 0   Ipratropium-Albuterol (COMBIVENT RESPIMAT) 20-100 MCG/ACT AERS respimat INHALE 1 PUFF BY MOUTH INTO THE LUNGS FOUR TIMES A DAY 12 g 3   ipratropium-albuterol (DUONEB) 0.5-2.5 (3) MG/3ML SOLN INHALE THE CONTENTS OF 1 VIAL VIA NEBULIZER EVERY 6 HOURS AS DIRECTED FOR SHORTNESS OF BREATH OR WHEEZING 360 mL 0   JANUMET 50-500 MG tablet TAKE 1 TABLET BY MOUTH ONCE A DAY (Patient taking differently: Take 1 tablet by mouth daily. ) 90 tablet 0   NARCAN 4 MG/0.1ML LIQD nasal spray kit Place 1 spray into the nose daily as needed (For overdose).      omeprazole (PRILOSEC) 40 MG capsule TAKE 1 CAPSULE BY MOUTH EVERY DAY (Patient taking differently: Take 40 mg by mouth daily. ) 30 capsule 0   oxyCODONE-acetaminophen (PERCOCET) 10-325 MG tablet Take 1 tablet by mouth every 4 (four) hours. 15 tablet 0   rosuvastatin (CRESTOR) 10 MG tablet Take 10 mg by mouth daily.     sacubitril-valsartan (ENTRESTO) 24-26 MG Take 1 tablet by mouth 2 (two) times daily. 180 tablet 3   VENTOLIN HFA 108 (90 Base) MCG/ACT inhaler Inhale 1-2 puffs into the lungs every 6 (six) hours as needed for wheezing or shortness of breath. 54 g 1   Vitamin D, Ergocalciferol, (DRISDOL) 1.25 MG (50000 UT) CAPS capsule TAKE ONE CAPSULE BY MOUTH ONCE WEEKLY (Patient taking differently: Take 50,000 Units by mouth every 7 (seven) days. ) 12 capsule 0   bisoprolol (ZEBETA) 5 MG tablet Take 1 tablet (5 mg total) by mouth daily. 90 tablet 3   No current facility-administered medications for this visit.    Allergies:   Penicillins and Cymbalta [duloxetine hcl]    Social History:  The patient  reports that he has been smoking cigarettes. He started smoking about 46 years ago. He has a 35.25 pack-year smoking history. He has never used smokeless tobacco. He reports current alcohol  use. He reports that he does not use drugs.   Family History:  The patient's family history includes Asthma in his brother and sister; COPD in his mother; Diabetes in his brother; Emphysema in his mother; Heart disease in his maternal uncle; Hyperlipidemia in his brother and sister; Hypertension in his brother, brother, and sister; Mental illness in his sister.    ROS: All other systems are reviewed and negative. Unless otherwise mentioned in H&P    PHYSICAL EXAM: VS:  BP 140/76 (BP Location: Right Arm, Patient Position: Sitting, Cuff Size: Normal)    Pulse 91    Ht '5\' 8"'  (1.727 m)    Wt 215 lb 12.8 oz (97.9 kg)    BMI 32.81 kg/m  , BMI Body mass index is 32.81 kg/m. GEN: Well nourished, well developed, in no acute distress HEENT: normal Neck: no JVD, carotid bruits, or masses Cardiac: RRR; no murmurs,  rubs, or gallops,no edema  Respiratory: Bilateral bibasilar crackles, occasional cough.  Essentially clear in the upper lobes. GI: soft, nontender, nondistended, + BS MS: no deformity or atrophy Skin: warm and dry, no rash Neuro:  Strength and sensation are intact Psych: euthymic mood, full affect   EKG:  Normal sinus rhythm with left axis deviation, QT 408 ms QTC 501 ms.   Recent Labs: 06/29/2019: ALT 15; B Natriuretic Peptide 124.2 06/30/2019: BUN 19; Creatinine, Ser 1.35; Hemoglobin 14.2; Magnesium 1.9; Platelets 199; Potassium 4.2; Sodium 140    Lipid Panel    Component Value Date/Time   CHOL 161 03/01/2018 0958   TRIG 140 05/26/2018 1209   HDL 40 03/01/2018 0958   CHOLHDL 4.0 03/01/2018 0958   LDLCALC 89 03/01/2018 0958      Wt Readings from Last 3 Encounters:  08/11/19 215 lb 12.8 oz (97.9 kg)  07/16/19 210 lb (95.3 kg)  07/10/19 223 lb (101.2 kg)      Other studies Reviewed: Echocardiogram 20-Jan-2019 1. Left ventricular ejection fraction, by visual estimation, is 30 to  35%. The left ventricle has moderate to severely decreased function. Left  ventricular septal  wall thickness was moderately increased. Moderately  increased left ventricular posterior  wall thickness. There is moderately increased left ventricular  hypertrophy.  2. Left ventricular diastolic parameters are consistent with Grade I  diastolic dysfunction (impaired relaxation).  3. The left ventricle demonstrates global hypokinesis.  4. Global right ventricle has mildly reduced systolic function.The right  ventricular size is normal. No increase in right ventricular wall  thickness.  5. Left atrial size was normal.  6. Right atrial size was normal.  7. The mitral valve is normal in structure. No evidence of mitral valve  regurgitation. No evidence of mitral stenosis.  8. The tricuspid valve is normal in structure. Tricuspid valve  regurgitation is not demonstrated.  9. The aortic valve is tricuspid. Aortic valve regurgitation is not  visualized. No evidence of aortic valve sclerosis or stenosis.  10. The pulmonic valve was normal in structure. Pulmonic valve  regurgitation is not visualized.  11. TR signal is inadequate for assessing pulmonary artery systolic  pressure.  12. The inferior vena cava is normal in size with greater than 50%  respiratory variability, suggesting right atrial pressure of 3 mmHg.  13. The average left ventricular global longitudinal strain is -9.2 %.    ASSESSMENT AND PLAN:  1. Chronic Systolic CHF:  Frequent hospitalizations for same. He appears euvolemic today. He did eat some pizza over the weekend. I have counseled him on low sodium diet.     He will continue Entresto 24 mg/25 mg BID. HR is not well controlled currently for his EF. I will restart bisoprolol 5 mg daily. He will keep a weight diary and a BP diary. He will bring it back on his next office visit.  May need to consider adding spironolactone to his regimen on next office visit to optimize treatment regimen and titrate up Entresto if BP can tolerate. Will also need to consider  repeating echo.    2. Hypertension: Blood pressure is not optimal for reduced ejection fraction.  Adding back bisoprolol 5 mg daily.  He may need to have titration of Entresto on next office visit.  I reviewed recent labs from June 2021 creatinine 1.35, potassium 4.2.  He may need to have follow-up labs on next office visit especially if we choose to increase his Entresto.  3. COPD: Follow with PCP for recommendation  on inhalers. Referral to pulmonary if clinically warranted.   4. Diabetes: Followed by PCP  Current medicines are reviewed at length with the patient today.  I have spent 25 minutes dedicated to the care of this patient on the date of this encounter to include pre-visit review of records, assessment, management and diagnostic testing,with shared decision making.  Labs/ tests ordered today include: None  Phill Myron. West Pugh, ANP, AACC   08/11/2019 9:18 AM    [facility] [place] [address] [phone] Fax [phone]  Notice: This dictation was prepared with Dragon dictation along with smaller phrase technology. Any transcriptional errors that result from this process are unintentional and may not be corrected upon review.

## 2019-08-11 ENCOUNTER — Other Ambulatory Visit: Payer: Self-pay

## 2019-08-11 ENCOUNTER — Ambulatory Visit (INDEPENDENT_AMBULATORY_CARE_PROVIDER_SITE_OTHER): Payer: Medicare HMO | Admitting: Adult Health

## 2019-08-11 ENCOUNTER — Encounter: Payer: Self-pay | Admitting: Adult Health

## 2019-08-11 VITALS — BP 140/76 | HR 91 | Ht 68.0 in | Wt 215.8 lb

## 2019-08-11 DIAGNOSIS — I5022 Chronic systolic (congestive) heart failure: Secondary | ICD-10-CM | POA: Diagnosis not present

## 2019-08-11 DIAGNOSIS — I428 Other cardiomyopathies: Secondary | ICD-10-CM

## 2019-08-11 DIAGNOSIS — I10 Essential (primary) hypertension: Secondary | ICD-10-CM | POA: Diagnosis not present

## 2019-08-11 DIAGNOSIS — E119 Type 2 diabetes mellitus without complications: Secondary | ICD-10-CM

## 2019-08-11 MED ORDER — BISOPROLOL FUMARATE 5 MG PO TABS: 5.0000 mg | ORAL_TABLET | Freq: Every day | ORAL | 3 refills | Status: DC

## 2019-08-11 NOTE — Patient Instructions (Signed)
Medication Instructions:  RESTART- Bisoprolol 5 mg by mouth daily  *If you need a refill on your cardiac medications before your next appointment, please call your pharmacy*   Lab Work: None Ordered   Testing/Procedures: None Ordered   Follow-Up: At [facility], you and your health needs are our priority.  As part of our continuing mission to provide you with exceptional heart care, we have created designated Provider Care Teams.  These Care Teams include your primary Cardiologist (physician) and Advanced Practice Providers (APPs -  Physician Assistants and Nurse Practitioners) who all work together to provide you with the care you need, when you need it.  We recommend signing up for the patient portal called "MyChart".  Sign up information is provided on this After Visit Summary.  MyChart is used to connect with patients for Virtual Visits (Telemedicine).  Patients are able to view lab/test results, encounter notes, upcoming appointments, etc.  Non-urgent messages can be sent to your provider as well.   To learn more about what you can do with MyChart, go to [email or web address].    Your next appointment:   6 week(s)  The format for your next appointment:   In Person  Provider:   You may see Rollene Rotunda, MD or one of the following Advanced Practice Providers on your designated Care Team:    Theodore Demark, PA-C  Joni Reining, DNP, ANP  Cadence Fransico Michael, [place]

## 2019-08-12 ENCOUNTER — Ambulatory Visit
Admission: RE | Admit: 2019-08-12 | Discharge: 2019-08-12 | Disposition: A | Discharge status: Home / Self Care | Payer: Medicare HMO | Source: Ambulatory Visit | Attending: Acute Care | Admitting: Acute Care

## 2019-08-12 DIAGNOSIS — Z87891 Personal history of nicotine dependence: Secondary | ICD-10-CM

## 2019-08-12 DIAGNOSIS — F17210 Nicotine dependence, cigarettes, uncomplicated: Secondary | ICD-10-CM

## 2019-08-14 NOTE — Progress Notes (Signed)
Please call patient and let them  know their  low dose Ct was read as a Lung RADS 2: nodules that are benign in appearance and behavior with a very low likelihood of becoming a clinically active cancer due to size or lack of growth. Recommendation per radiology is for a repeat LDCT in 12 months. .Please let them  know we will order and schedule their  annual screening scan for 07/2020. Please let them  know there was notation of CAD on their  scan.  Please remind the patient  that this is a non-gated exam therefore degree or severity of disease  cannot be determined. Please have them  follow up with their PCP regarding potential risk factor modification, dietary therapy or pharmacologic therapy if clinically indicated. Pt.  is  currently on statin therapy. Please place order for annual  screening scan for  07/2020 and fax results to PCP. Thanks so much. 

## 2019-08-18 ENCOUNTER — Telehealth: Payer: Self-pay | Admitting: Emergency Medicine

## 2019-08-18 DIAGNOSIS — Z87891 Personal history of nicotine dependence: Secondary | ICD-10-CM

## 2019-08-18 DIAGNOSIS — F17210 Nicotine dependence, cigarettes, uncomplicated: Secondary | ICD-10-CM

## 2019-08-18 NOTE — Telephone Encounter (Signed)
Routing to Lung CA screening pool since [place] had called him, thanks

## 2019-08-18 NOTE — Telephone Encounter (Signed)
Pt informed of CT results per Sarah Groce, NP.  PT verbalized understanding.  Copy sent to PCP.  Order placed for 1 yr f/u CT.  

## 2019-08-21 NOTE — Progress Notes (Signed)
Please call patient and let them  know their  low dose Ct was read as a Lung RADS 2: nodules that are benign in appearance and behavior with a very low likelihood of becoming a clinically active cancer due to size or lack of growth. Recommendation per radiology is for a repeat LDCT in 12 months. .Please let them  know we will order and schedule their  annual screening scan for 07/2020. Please let them  know there was notation of CAD on their  scan.  Please remind the patient  that this is a non-gated exam therefore degree or severity of disease  cannot be determined. Please have them  follow up with their PCP regarding potential risk factor modification, dietary therapy or pharmacologic therapy if clinically indicated. Pt.  is  currently on statin therapy. Please place order for annual  screening scan for  07/2020 and fax results to PCP. Thanks so much. 

## 2019-09-11 ENCOUNTER — Other Ambulatory Visit: Payer: Self-pay

## 2019-09-11 NOTE — Patient Outreach (Signed)
[facility]) Care Management  09/11/2019  Scott [name] Jan 19, 1961 [id number]   Telephone Assessment Quarterly Call     Unsuccessful outreach attempt to patient.      Plan: RN CM will make quarterly outreach attempt call within the month of November if no return call from patient.  RN CM will send unsuccessful letter to patient.   Antionette Fairy, RN,BSN,CCM [facility] Care Management Telephonic Care Management Coordinator Direct Phone: [phone] Toll Free: [phone] Fax: [phone]

## 2019-09-16 ENCOUNTER — Ambulatory Visit: Payer: Self-pay

## 2019-10-10 ENCOUNTER — Ambulatory Visit: Payer: Medicare HMO | Admitting: Cardiology

## 2019-10-16 ENCOUNTER — Ambulatory Visit: Payer: Self-pay

## 2019-11-11 ENCOUNTER — Telehealth: Payer: Self-pay | Admitting: Emergency Medicine

## 2019-11-11 DIAGNOSIS — J449 Chronic obstructive pulmonary disease, unspecified: Secondary | ICD-10-CM

## 2019-11-11 MED ORDER — VENTOLIN HFA 108 (90 BASE) MCG/ACT IN AERS
1.0000 | INHALATION_SPRAY | Freq: Four times a day (QID) | RESPIRATORY_TRACT | 0 refills | Status: DC | PRN
Start: 2019-11-11 — End: 2020-02-16

## 2019-11-11 NOTE — Telephone Encounter (Signed)
Spoke with pt. He is needing a prescription sent in for Ventolin. His shipment from [facility] will not be here for another 7-10 days and he is completely out of medication. Rx has been sent in for #1 inhaler. Pt was also scheduled for his follow up with RB. Nothing further was needed.

## 2019-11-14 ENCOUNTER — Ambulatory Visit: Payer: Medicare HMO | Admitting: Cardiology

## 2019-11-25 ENCOUNTER — Other Ambulatory Visit: Payer: Self-pay

## 2019-11-25 NOTE — Patient Outreach (Signed)
[facility]) Care Management  11/25/2019  Scott [name] 1960/05/19 [id number]   Telephone Assessment   Unsuccessful quarterly outreach attempt to patient.    Plan:  RN CM will make quarterly outreach attempt to patient within the month of Feb  if no return call from patient.  Antionette Fairy, RN,BSN,CCM [facility] Care Management Telephonic Care Management Coordinator Direct Phone: [phone] Toll Free: [phone] Fax: [phone]

## 2019-11-26 ENCOUNTER — Ambulatory Visit: Payer: Self-pay

## 2020-01-01 ENCOUNTER — Ambulatory Visit: Payer: Medicare HMO | Admitting: Cardiology

## 2020-01-05 ENCOUNTER — Ambulatory Visit (INDEPENDENT_AMBULATORY_CARE_PROVIDER_SITE_OTHER): Payer: Medicare HMO | Admitting: Emergency Medicine

## 2020-01-05 ENCOUNTER — Encounter: Payer: Self-pay | Admitting: Emergency Medicine

## 2020-01-05 ENCOUNTER — Other Ambulatory Visit: Payer: Self-pay

## 2020-01-05 DIAGNOSIS — K219 Gastro-esophageal reflux disease without esophagitis: Secondary | ICD-10-CM

## 2020-01-05 DIAGNOSIS — J449 Chronic obstructive pulmonary disease, unspecified: Secondary | ICD-10-CM | POA: Diagnosis not present

## 2020-01-05 DIAGNOSIS — F17200 Nicotine dependence, unspecified, uncomplicated: Secondary | ICD-10-CM

## 2020-01-05 DIAGNOSIS — J301 Allergic rhinitis due to pollen: Secondary | ICD-10-CM | POA: Diagnosis not present

## 2020-01-05 DIAGNOSIS — J9611 Chronic respiratory failure with hypoxia: Secondary | ICD-10-CM

## 2020-01-05 NOTE — Progress Notes (Signed)
Subjective:    Patient ID: Scott [name], male    DOB: Oct 08, 1960, 59 y.o.   MRN: [id number]  HPI  ROV 07/16/19 --59 year old man follows up for his history of tobacco use, COPD with severe obstruction by spirometry, exertional hypoxemia.  Also with some allergies, GERD they contribute to upper airway irritability and cough, hypertension with diastolic CHF.  Recent exacerbation beginning of June, started with nausea/ emesis, treated with antibiotics steroids. COVID negative. He has been vaccinated.  Continues to smoke approximately 3-4 cig a day. Just started back on Wellbutrin 2 months ago. Considering starting a quit date.  Managed on Advair, Combivent on a schedule (or DuoNeb if he is at home). Uses ventolin 2-3x a day.  Needs his meds changed over to [facility].  Supposed to be on oxygen at 2 L/min with activity hands while sleeping. He wants a POC, has portable tanks - doesn't use reliably.   ROV 01/05/20 -59 year old gentleman with history tobacco use, COPD with associated severe obstruction was from a tree, exertional hypoxemia.  Also with cough, upper irritability in setting of GERD, allergic rhinitis.  He has had difficulty with smoke cessation he tells me that he just stopped 1 month ago.  We have been managing him on Advair and Combivent/DuoNeb, albuterol if needed - about 3-4x a day.  He also has Ventolin which she uses approximately.  He has documented desaturations and is supposed to use oxygen at 2 L/min with exertion.  Today he reports that he is doing fairly well. Occasional cough. He has flonase but isn't using right now. He is using omeprazole. Her reports that he quit smoking 1 month ago - has had some anxiety but is tolerating ok. He is on wellbutrin, follows with psych.  No flares, no pred or abx since hospitalized in June 21.   Lung cancer screening CT 08/12/2019 reviewed by me, shows RADS 2 study, diffuse bronchial wall thickening without consolidation or masses, tiny peripheral right  upper lobe pulmonary nodule 2.3 mm.  No other nodules.  Would need to follow-up in 1 year.      No flowsheet data found.  Objective:   Physical Exam  Vitals:   01/05/20 0918  BP: 126/80  Pulse: 85  Temp: 97.6 F (36.4 C)  SpO2: 93%  Weight: 206 lb 12.8 oz (93.8 kg)  Height: 5\' 8"  (1.727 m)   Gen: Pleasant, well-nourished, in no distress  ENT: No lesions,  mouth clear,  oropharynx clear, no postnasal drip,   Neck: No JVD, no stridor  Lungs: mostly clear, soft end exp wheeze B L>R  Cardiovascular: RRR, heart sounds normal, no murmur or gallops, trace peripheral edema  Musculoskeletal: No deformities, no cyanosis or clubbing  Neuro: alert, appropriate, non-focal  Skin: Warm     Assessment & Plan:  Chronic respiratory failure with hypoxia (HCC) Continue your oxygen with exertion.  We will work on try to get you a portable oxygen concentrator  COPD (chronic obstructive pulmonary disease) Continue Advair twice a day.  Rinse and gargle after using Continue Combivent 2 puffs 4 times a day on a schedule Keep your Ventolin (albuterol) available use 2 puffs when needed shortness breath, chest tightness, wheeze.  Allergic rhinitis Try restarting your fluticasone nasal spray to see if this helps with your cough  GERD (gastroesophageal reflux disease) Continue your omeprazole as you have been taking it  Tobacco use disorder Congratulations on stopping smoking.  This is critically important for your health.  Make every effort to avoid  restarting Continue wellbutrin   Levy Pupa, MD, PhD 01/05/2020, 9:38 AM [place] [phone] or if no answer [phone]

## 2020-01-05 NOTE — Addendum Note (Signed)
Addended by: Dorisann Frames R on: 01/05/2020 11:20 AM   Modules accepted: Orders

## 2020-01-05 NOTE — Assessment & Plan Note (Addendum)
Congratulations on stopping smoking.  This is critically important for your health.  Make every effort to avoid restarting Continue wellbutrin

## 2020-01-05 NOTE — Addendum Note (Signed)
Addended by: Dorisann Frames R on: 01/05/2020 11:17 AM   Modules accepted: Orders

## 2020-01-05 NOTE — Patient Instructions (Signed)
Congratulations on stopping smoking.  This is critically important for your health.  Make every effort to avoid restarting Continue your oxygen with exertion.  We will work on try to get you a portable oxygen concentrator Continue Advair twice a day.  Rinse and gargle after using Continue Combivent 2 puffs 4 times a day on a schedule Keep your Ventolin (albuterol) available use 2 puffs when needed shortness breath, chest tightness, wheeze. Your cancer screening CT scan 08/12/2019 was reassuring.  You need another July 2022 Continue your omeprazole as you have been taking it Try restarting your fluticasone nasal spray to see if this helps with your cough Follow with Dr Delton Coombes in 6 months or sooner if you have any problems

## 2020-01-05 NOTE — Assessment & Plan Note (Signed)
Continue your omeprazole as you have been taking it 

## 2020-01-05 NOTE — Assessment & Plan Note (Signed)
Continue your oxygen with exertion.  We will work on try to get you a portable oxygen concentrator

## 2020-01-05 NOTE — Assessment & Plan Note (Signed)
Try restarting your fluticasone nasal spray to see if this helps with your cough

## 2020-01-05 NOTE — Assessment & Plan Note (Signed)
Continue Advair twice a day.  Rinse and gargle after using Continue Combivent 2 puffs 4 times a day on a schedule Keep your Ventolin (albuterol) available use 2 puffs when needed shortness breath, chest tightness, wheeze.

## 2020-02-16 ENCOUNTER — Other Ambulatory Visit: Payer: Self-pay

## 2020-02-16 ENCOUNTER — Other Ambulatory Visit: Payer: Self-pay | Admitting: Emergency Medicine

## 2020-02-16 DIAGNOSIS — J449 Chronic obstructive pulmonary disease, unspecified: Secondary | ICD-10-CM

## 2020-02-16 NOTE — Patient Outreach (Signed)
[facility]) Care Management  02/16/2020  Scott [name] 1960/11/15 [id number]   Telephone Assessment Quarterly Call    Unsuccessful quarterly outreach attempt to patient.    Plan: RN CM will make quarterly outreach attempt to patient within the month of April  if no return call from patient.  Antionette Fairy, RN,BSN,CCM [facility] Care Management Telephonic Care Management Coordinator Direct Phone: [phone] Toll Free: [phone] Fax: [phone]

## 2020-02-17 ENCOUNTER — Ambulatory Visit: Payer: Self-pay

## 2020-02-22 ENCOUNTER — Other Ambulatory Visit: Payer: Self-pay | Admitting: Emergency Medicine

## 2020-03-07 NOTE — Progress Notes (Deleted)
Cardiology Office Note   Date:  03/07/2020   ID:  Scott [name], DOB 1960/10/22, MRN [id number]  PCP:  Fredrich Romans, [place]  Cardiologist:   Minus Breeding, MD   No chief complaint on file.     History of Present Illness: Scott [name] is a 60 y.o. male who presents for chronic systolic heart failure, nonischemic cardiomyopathy He has no angiographic evidence of CAD.with right heart cath revealing volume overload.  EF was 30% to 35%.   He was hospitalized in early June 2021 due to respiratory failure, COPD exacerbation, and hypotension. At that time Entresto, Lasix, and bisoprolol were discontinued.  We have been titrating these meds back up at recent visits.  ***  ***   ***  On last office visit date 07/10/2019, he was advised to take Entresto 24/26 mg twice daily, decrease Lasix to 20 mg daily, and hold off on bisoprolol. I wanted to see him on follow-up in 1 month in person. He is to continue to take his blood pressure daily and report any blood pressures less than 262 systolic.  Since we last saw him remotely he feels his heart racing.  He feels like he might have some shortness of breath if he is lying flat and he is not describing PND or orthopnea.  He has had no edema.  His weights have been relatively stable although they seem to bounce around.  He seems to be watching some of his salt.  He hears a little wheezing but he still smoking.  He has not been having any new chest pressure, neck or arm discomfort.  He has tolerated the increased medications with Entresto.  He has had no presyncope or syncope.  He wears his oxygen at night.   Past Medical History:  Diagnosis Date  . 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 COPD (chronic obstructive pulmonary disease) ([place])   . Diabetes mellitus without complication ([place])   . GERD (gastroesophageal reflux disease)   . Headache(784.0)   . Pneumonia    hx    Past Surgical History:  Procedure Laterality Date   . ANTERIOR CERVICAL DECOMP/DISCECTOMY FUSION  12/22/2010   Procedure: ANTERIOR CERVICAL DECOMPRESSION/DISCECTOMY FUSION 3 LEVELS;  Surgeon: Ophelia Charter;  Location: [place] [facility];  Service: Neurosurgery;  Laterality: N/A;  Anterior cervical discectomy with fusion cervical three-four, four-five, and five sixCDF with Interbody Prosthesis, plating, and Bone Graft   . ANTERIOR CERVICAL DECOMP/DISCECTOMY FUSION N/A 10/16/2012   Procedure: CERVICAL SIX-SEVEN ANTERIOR CERVICAL DECOMPRESSION/DISCECTOMY FUSION WITH INTERBODY PROTHESIS PLATING BONEGRAFT WITH /POSSIBLE HARDWARE REMOVAL OLD PLATE;  Surgeon: Ophelia Charter, MD;  Location: [place] [facility];  Service: Neurosurgery;  Laterality: N/A;  . MULTIPLE TOOTH EXTRACTIONS    . OTHER SURGICAL HISTORY     surgery on cheekbone and head for fall 2000  . 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 RIGHT/LEFT HEART CATH AND CORONARY ANGIOGRAPHY N/A 09/23/2018   Procedure: RIGHT/LEFT HEART CATH AND CORONARY ANGIOGRAPHY and possible PCI/stent;  Surgeon: MBurnell Blanks MD;  [facility];  Service: Cardiovascular;  Laterality: N/A;  . SPINE SURGERY     2014 and 2016 - Dr JArnoldo Morale . TONSILLECTOMY       Current Outpatient Medications  Medication Sig Dispense Refill  . ADVAIR DISKUS 250-50 MCG/DOSE  AEPB INHALE 1 PUFF TWICE DAILY 180 each 1  . aspirin 325 MG EC tablet Take 81 mg by mouth daily.    . bisoprolol (ZEBETA) 5 MG tablet Take 1 tablet (5 mg total) by mouth daily. 30 tablet 3  . buPROPion (WELLBUTRIN SR) 150 MG 12 hr tablet     . diazepam (VALIUM) 5 MG tablet Take 5 mg by mouth 2 (two) times daily as needed for anxiety.    . fluticasone (FLONASE) 50 MCG/ACT nasal spray Place 2 sprays into both nostrils daily.    . furosemide (LASIX) 20 MG tablet Take 1 tablet (20 mg total) by mouth daily. 90 tablet 3  . gabapentin (NEURONTIN) 300  MG capsule TAKE 1 CAPSULE BY MOUTH 3 TIMES DAILY (Patient taking differently: Take 300 mg by mouth 3 (three) times daily.) 90 capsule 0  . Ipratropium-Albuterol (COMBIVENT RESPIMAT) 20-100 MCG/ACT AERS respimat INHALE 1 PUFF BY MOUTH INTO THE LUNGS FOUR TIMES A DAY 12 g 3  . ipratropium-albuterol (DUONEB) 0.5-2.5 (3) MG/3ML SOLN INHALE THE CONTENTS OF 1 VIAL VIA NEBULIZER EVERY 6 HOURS AS DIRECTED FOR SHORTNESS OF BREATH OR WHEEZING 360 mL 0  . JANUMET 50-500 MG tablet TAKE 1 TABLET BY MOUTH ONCE A DAY (Patient taking differently: Take 1 tablet by mouth daily.) 90 tablet 0  . NARCAN 4 MG/0.1ML LIQD nasal spray kit Place 1 spray into the nose daily as needed (For overdose).     Marland Kitchen omeprazole (PRILOSEC) 40 MG capsule TAKE 1 CAPSULE BY MOUTH EVERY DAY (Patient taking differently: Take 40 mg by mouth daily.) 30 capsule 0  . oxyCODONE-acetaminophen (PERCOCET) 10-325 MG tablet Take 1 tablet by mouth every 4 (four) hours. 15 tablet 0  . rosuvastatin (CRESTOR) 10 MG tablet Take 10 mg by mouth daily.    . sacubitril-valsartan (ENTRESTO) 24-26 MG Take 1 tablet by mouth 2 (two) times daily. 180 tablet 3  . VENTOLIN HFA 108 (90 Base) MCG/ACT inhaler INHALE 1 TO 2 PUFFS INTO THE LUNGS EVERY 6 HOURS AS NEEDED FOR WHEEZING OR SHORTNESS OF BREATH 18 g 0  . Vitamin D, Ergocalciferol, (DRISDOL) 1.25 MG (50000 UT) CAPS capsule TAKE ONE CAPSULE BY MOUTH ONCE WEEKLY (Patient taking differently: Take 50,000 Units by mouth every 7 (seven) days.) 12 capsule 0   No current facility-administered medications for this visit.    Allergies:   Penicillins and Cymbalta [duloxetine hcl]    ROS:  Please see the history of present illness.   Otherwise, review of systems are positive for ***.   All other systems are reviewed and negative.    PHYSICAL EXAM: VS:  There were no vitals taken for this visit. , BMI There is no height or weight on file to calculate BMI. GENERAL:  Well appearing NECK:  No jugular venous distention,  waveform within normal limits, carotid upstroke brisk and symmetric, no bruits, no thyromegaly LUNGS:  Clear to auscultation bilaterally CHEST:  Unremarkable HEART:  PMI not displaced or sustained,S1 and S2 within normal limits, no S3, no S4, no clicks, no rubs, *** murmurs ABD:  Flat, positive bowel sounds normal in frequency in pitch, no bruits, no rebound, no guarding, no midline pulsatile mass, no hepatomegaly, no splenomegaly EXT:  2 plus pulses throughout, no edema, no cyanosis no clubbing     ***GENERAL:  Well appearing NECK:  No jugular venous distention, waveform within normal limits, carotid upstroke brisk and symmetric, no bruits, no thyromegaly LUNGS:  Clear to auscultation bilaterally CHEST:  Mild expiratory wheezes.  HEART:  PMI not displaced or sustained,S1 and S2 within normal limits, no S3, no S4, no clicks, no rubs, no murmurs ABD:  Flat, positive bowel sounds normal in frequency in pitch, no bruits, no rebound, no guarding, no midline pulsatile mass, no hepatomegaly, no splenomegaly EXT:  2 plus pulses throughout, no edema, no cyanosis no clubbing    EKG:  EKG is *** ordered today. The ekg ordered today demonstrates sinus tachycardia, rate *** left axis deviation, possible old inferior infarct, lateral T wave inversions slightly more prominent than previous..   Recent Labs: 06/29/2019: ALT 15; B Natriuretic Peptide 124.2 06/30/2019: BUN 19; Creatinine, Ser 1.35; Hemoglobin 14.2; Magnesium 1.9; Platelets 199; Potassium 4.2; Sodium 140    Lipid Panel    Component Value Date/Time   CHOL 161 03/01/2018 0958   TRIG 140 05/26/2018 1209   HDL 40 03/01/2018 0958   CHOLHDL 4.0 03/01/2018 0958   LDLCALC 89 03/01/2018 0958      Wt Readings from Last 3 Encounters:  01/05/20 206 lb 12.8 oz (93.8 kg)  08/11/19 215 lb 12.8 oz (97.9 kg)  07/16/19 210 lb (95.3 kg)      Other studies Reviewed: Additional studies/ records that were reviewed today include: ***. Review of  the above records demonstrates:  Please see elsewhere in the note.     ASSESSMENT AND PLAN:  Nonischemic cardiomyopathy:     ***  We had a long discussion about this.  We talked about weighing himself at the same time every day.  I reinforced previous education.  We talked about salt and fluid restriction.  I would like to try to titrate his beta-blocker but switch him to bisoprolol as it is more beta selective given his ongoing wheezing.  I think he is in need to be seen frequently to titrate meds as possible.  He might eventually need advanced therapies.   Hypertension:  ***  his is being managed in the context of treating his CHF   COPD:   ***   We talked about the need to stop smoking.  He has nicotine patches now.  Current medicines are reviewed at length with the patient today.  The patient does not have concerns regarding medicines.  The following changes have been made:  ***  Labs/ tests ordered today include:  ***  No orders of the defined types were placed in this encounter.    Disposition:   FU with ***     Signed, Minus Breeding, MD  03/07/2020 6:26 PM    [facility]

## 2020-03-08 ENCOUNTER — Other Ambulatory Visit: Payer: Self-pay

## 2020-03-08 ENCOUNTER — Ambulatory Visit (INDEPENDENT_AMBULATORY_CARE_PROVIDER_SITE_OTHER): Payer: Medicare HMO | Admitting: Cardiology

## 2020-03-08 ENCOUNTER — Encounter: Payer: Self-pay | Admitting: Cardiology

## 2020-03-08 VITALS — BP 124/80 | HR 75 | Ht 68.0 in | Wt 208.4 lb

## 2020-03-08 DIAGNOSIS — Z79899 Other long term (current) drug therapy: Secondary | ICD-10-CM | POA: Diagnosis not present

## 2020-03-08 DIAGNOSIS — I428 Other cardiomyopathies: Secondary | ICD-10-CM

## 2020-03-08 DIAGNOSIS — I10 Essential (primary) hypertension: Secondary | ICD-10-CM

## 2020-03-08 DIAGNOSIS — J441 Chronic obstructive pulmonary disease with (acute) exacerbation: Secondary | ICD-10-CM

## 2020-03-08 MED ORDER — FUROSEMIDE 20 MG PO TABS: 20.0000 mg | ORAL_TABLET | Freq: Every day | ORAL | 3 refills | Status: DC

## 2020-03-08 MED ORDER — SACUBITRIL-VALSARTAN 49-51 MG PO TABS: 1.0000 | ORAL_TABLET | Freq: Two times a day (BID) | ORAL | 3 refills | Status: DC

## 2020-03-08 MED ORDER — NYSTATIN 100000 UNIT/ML MT SUSP: 5.0000 mL | Freq: Four times a day (QID) | OROMUCOSAL | 0 refills | Status: DC

## 2020-03-08 MED ORDER — BISOPROLOL FUMARATE 5 MG PO TABS: 5.0000 mg | ORAL_TABLET | Freq: Every day | ORAL | 3 refills | Status: DC

## 2020-03-08 NOTE — Patient Instructions (Signed)
Medication Instructions:  [employer]: Increase Entresto to 49-51mg , take 1 tablet by mouth twice a day Lasix Refilled  [facility]: Nystatin - take 85ml by mouth every 4 hours. *If you need a refill on your cardiac medications before your next appointment, please call your pharmacy*  Lab Work: Your physician recommends that you return for lab work 3 weeks after starting new dose of Entresto. If you have labs (blood work) drawn today and your tests are completely normal, you will receive your results only by: Marland Kitchen MyChart Message (if you have MyChart) OR . A paper copy in the mail If you have any lab test that is abnormal or we need to change your treatment, we will call you to review the results.  Follow-Up: At [facility], you and your health needs are our priority.  As part of our continuing mission to provide you with exceptional heart care, we have created designated Provider Care Teams.  These Care Teams include your primary Cardiologist (physician) and Advanced Practice Providers (APPs -  Physician Assistants and Nurse Practitioners) who all work together to provide you with the care you need, when you need it.  Your next appointment:   2 month(s)  The format for your next appointment:   In Person  Provider:   You will see the following Advanced Practice Providers on your designated Care Team:    Joni Reining, DNP, ANP

## 2020-03-08 NOTE — Progress Notes (Signed)
Cardiology Office Note   Date:  03/08/2020   ID:  Scott [name], DOB [birth year], MRN [id number]  PCP:  Scott [name], [place]  Cardiologist:   Minus Breeding, MD   No chief complaint on file.     History of Present Illness: Scott [name] is a 60 y.o. male who presents for chronic systolic heart failure, nonischemic cardiomyopathy He has no angiographic evidence of CAD.with right heart cath revealing volume overload.  EF was 30% to 35%.   He was hospitalized in early June 2021 due to respiratory failure, COPD exacerbation, and hypotension. At that time Entresto, Lasix, and bisoprolol were discontinued.  We have been titrating these meds back up at recent visits.  He uses O2 PRN.    He says he does get dyspneic.  Will get short of breath walking around the block to his daughter's house.  He takes his inhalers.  He routinely sees Dr. Lamonte Sakai and has been encouraged to use his oxygen more frequently.  He is not having any new cardiovascular symptoms.  He is not having any resting shortness of breath, PND or orthopnea.  He is having no new palpitations, presyncope or syncope.  He has had no chest pain.  He has had no weight gain or shortness of breath.  He had stopped smoking cigarettes.  However, he has had quite a bit of stress.  Both of his children are in jail right now and he has three grandchildren with him.   Past Medical History:  Diagnosis Date  . 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 COPD (chronic obstructive pulmonary disease) ([place])   . Diabetes mellitus without complication ([place])   . GERD (gastroesophageal reflux disease)   . Headache(784.0)   . Pneumonia    hx    Past Surgical History:  Procedure Laterality Date  . ANTERIOR CERVICAL DECOMP/DISCECTOMY FUSION  12/22/2010   Procedure: ANTERIOR CERVICAL DECOMPRESSION/DISCECTOMY FUSION 3 LEVELS;  Surgeon: Ophelia Charter;  Location: [place] [facility];  Service: Neurosurgery;  Laterality: N/A;  Anterior  cervical discectomy with fusion cervical three-four, four-five, and five sixCDF with Interbody Prosthesis, plating, and Bone Graft   . ANTERIOR CERVICAL DECOMP/DISCECTOMY FUSION N/A 10/16/2012   Procedure: CERVICAL SIX-SEVEN ANTERIOR CERVICAL DECOMPRESSION/DISCECTOMY FUSION WITH INTERBODY PROTHESIS PLATING BONEGRAFT WITH /POSSIBLE HARDWARE REMOVAL OLD PLATE;  Surgeon: Ophelia Charter, MD;  Location: [place] [facility];  Service: Neurosurgery;  Laterality: N/A;  . MULTIPLE TOOTH EXTRACTIONS    . OTHER SURGICAL HISTORY     surgery on cheekbone and head for fall 2000  . 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 RIGHT/LEFT HEART CATH AND CORONARY ANGIOGRAPHY N/A 09/23/2018   Procedure: RIGHT/LEFT HEART CATH AND CORONARY ANGIOGRAPHY and possible PCI/stent;  Surgeon: MBurnell Blanks MD;  [facility];  Service: Cardiovascular;  Laterality: N/A;  . SPINE SURGERY     2014 and 2016 - Dr JArnoldo Morale . TONSILLECTOMY       Current Outpatient Medications  Medication Sig Dispense Refill  . ADVAIR DISKUS 250-50 MCG/DOSE AEPB INHALE 1 PUFF TWICE DAILY 180 each 1  . aspirin 325 MG EC tablet Take 81 mg by mouth daily.    . bisoprolol (ZEBETA) 5 MG tablet Take 1 tablet (5 mg total) by mouth daily. 30 tablet 3  . buPROPion ([facility]  SR) 150 MG 12 hr tablet     . diazepam (VALIUM) 5 MG tablet Take 5 mg by mouth 2 (two) times daily as needed for anxiety.    . fluticasone (FLONASE) 50 MCG/ACT nasal spray Place 2 sprays into both nostrils daily.    . furosemide (LASIX) 20 MG tablet Take 1 tablet (20 mg total) by mouth daily. 90 tablet 3  . gabapentin (NEURONTIN) 300 MG capsule TAKE 1 CAPSULE BY MOUTH 3 TIMES DAILY (Patient taking differently: Take 300 mg by mouth 3 (three) times daily.) 90 capsule 0  . Ipratropium-Albuterol (COMBIVENT RESPIMAT) 20-100 MCG/ACT AERS respimat INHALE 1 PUFF BY MOUTH INTO  THE LUNGS FOUR TIMES A DAY 12 g 3  . ipratropium-albuterol (DUONEB) 0.5-2.5 (3) MG/3ML SOLN INHALE THE CONTENTS OF 1 VIAL VIA NEBULIZER EVERY 6 HOURS AS DIRECTED FOR SHORTNESS OF BREATH OR WHEEZING 360 mL 0  . JANUMET 50-500 MG tablet TAKE 1 TABLET BY MOUTH ONCE A DAY (Patient taking differently: Take 1 tablet by mouth daily.) 90 tablet 0  . NARCAN 4 MG/0.1ML LIQD nasal spray kit Place 1 spray into the nose daily as needed (For overdose).     Marland Kitchen omeprazole (PRILOSEC) 40 MG capsule TAKE 1 CAPSULE BY MOUTH EVERY DAY (Patient taking differently: Take 40 mg by mouth daily.) 30 capsule 0  . oxyCODONE-acetaminophen (PERCOCET) 10-325 MG tablet Take 1 tablet by mouth every 4 (four) hours. 15 tablet 0  . rosuvastatin (CRESTOR) 10 MG tablet Take 10 mg by mouth daily.    . sacubitril-valsartan (ENTRESTO) 24-26 MG Take 1 tablet by mouth 2 (two) times daily. 180 tablet 3  . VENTOLIN HFA 108 (90 Base) MCG/ACT inhaler INHALE 1 TO 2 PUFFS INTO THE LUNGS EVERY 6 HOURS AS NEEDED FOR WHEEZING OR SHORTNESS OF BREATH 18 g 0  . Vitamin D, Ergocalciferol, (DRISDOL) 1.25 MG (50000 UT) CAPS capsule TAKE ONE CAPSULE BY MOUTH ONCE WEEKLY (Patient taking differently: Take 50,000 Units by mouth every 7 (seven) days.) 12 capsule 0   No current facility-administered medications for this visit.    Allergies:   Penicillins and Cymbalta [duloxetine hcl]    ROS:  Please see the history of present illness.   Otherwise, review of systems are positive for none.   All other systems are reviewed and negative.    PHYSICAL EXAM: VS:  BP 124/80   Pulse 75   Ht '5\' 8"'  (1.727 m)   Wt 208 lb 6.4 oz (94.5 kg)   BMI 31.69 kg/m  , BMI Body mass index is 31.69 kg/m. GENERAL:  Well appearing HEENT:  Candidiasis NECK:  No jugular venous distention, waveform within normal limits, carotid upstroke brisk and symmetric, no bruits, no thyromegaly LUNGS: Bilateral diffuse wheezing CHEST:  Unremarkable HEART:  PMI not displaced or sustained,S1  and S2 within normal limits, no S3, no S4, no clicks, no rubs, no murmurs ABD:  Flat, positive bowel sounds normal in frequency in pitch, no bruits, no rebound, no guarding, no midline pulsatile mass, no hepatomegaly, no splenomegaly EXT:  2 plus pulses throughout, no edema, no cyanosis no clubbing     EKG:  EKG is  ordered today. The ekg ordered today demonstrates sinus tachycardia, rate 75 left axis deviation.  No acute ST-T wave changes   Recent Labs: 06/29/2019: ALT 15; B Natriuretic Peptide 124.2 06/30/2019: BUN 19; Creatinine, Ser 1.35; Hemoglobin 14.2; Magnesium 1.9; Platelets 199; Potassium 4.2; Sodium 140    Lipid Panel    Component Value Date/Time  CHOL 161 03/01/2018 0958   TRIG 140 05/26/2018 1209   HDL 40 03/01/2018 0958   CHOLHDL 4.0 03/01/2018 0958   LDLCALC 89 03/01/2018 0958      Wt Readings from Last 3 Encounters:  03/08/20 208 lb 6.4 oz (94.5 kg)  01/05/20 206 lb 12.8 oz (93.8 kg)  08/11/19 215 lb 12.8 oz (97.9 kg)      Other studies Reviewed: Additional studies/ records that were reviewed today include: Labs. Review of the above records demonstrates:  Please see elsewhere in the note.     ASSESSMENT AND PLAN:  Nonischemic cardiomyopathy:       Patient seems to do relatively well with this currently.  I am going to titrate his Entresto to 49/51.  I will check a basic metabolic profile again in 3 weeks.   Tobacco abuse: He is again encouraged to quit smoking completely  Oral thrush: I will give him nystatin swish and swallow  Hypertension:  This is being managed in the context of treating his CHF  COPD:    He needs to wear O2 more frequently.     Current medicines are reviewed at length with the patient today.  The patient does not have concerns regarding medicines.  The following changes have been made:  As above  Labs/ tests ordered today include:    No orders of the defined types were placed in this encounter.    Disposition:   FU with NP  in 2 months.      Signed, Minus Breeding, MD  03/08/2020 9:09 AM    [facility]

## 2020-03-09 ENCOUNTER — Encounter: Payer: Self-pay | Admitting: Cardiology

## 2020-05-04 NOTE — Progress Notes (Signed)
Cardiology Office Note   Date:  05/07/2020   ID:  Scott [name], DOB 05-Nov-1960, MRN [id number]  PCP:  Scott [name], [place]  Cardiologist: Dr. Percival [name] CC: Follow Up   History of Present Illness: Scott [name] is a 60 y.o. male who presents for ongoing assessment and management of chronic systolic heart failure, nonischemic cardiomyopathy.  He has no angiographic evidence of coronary artery disease with his most recent catheterization being a right heart cath revealing volume overload.  EF was 30% to 35%.  He had recent hospitalization in June 2021 due to respiratory failure, COPD exacerbation and hypotension.  He remains on oxygen O2 via nasal cannula as needed.  He was last seen in the office by Dr. Percival [name] on 02/27/2020 at which time he continued to have some dyspnea, class I-II symptoms.  At that time he did not have any cardiovascular complaints.  On last visit Entresto was increased to 49/51 mg twice daily.  He was encouraged to fully stop smoking, he was found to have some oral thrush and was given nystatin swish and swallow, he was advised to wear his oxygen more frequently.  He comes today without any complaints with exception of occasional palpitations which are disturbing to him.  He states she feels as if his heart is stopping.  Otherwise he is doing well and tolerating the higher dose of Entresto 4951 that was started on last office visit.  He has been walking a couple of miles a day.  His son lives about a block and a half away and he goes back and forth to his home about 3 times a day just for the exercise.  He is no longer having to care for his grandchildren and he states that his home is peaceful now and he and his wife are enjoying it being just the 2 of them again.  Past Medical History:  Diagnosis Date  . Arthritis    states [name] told him he has arthritis in spine  . COPD (chronic obstructive pulmonary disease) ([place])   . Diabetes mellitus without complication ([place])   . GERD  (gastroesophageal reflux disease)   . Headache(784.0)   . Pneumonia    hx    Past Surgical History:  Procedure Laterality Date  . ANTERIOR CERVICAL DECOMP/DISCECTOMY FUSION  12/22/2010   Procedure: ANTERIOR CERVICAL DECOMPRESSION/DISCECTOMY FUSION 3 LEVELS;  Surgeon: Scott [name];  Location: [place] [facility];  Service: Neurosurgery;  Laterality: N/A;  Anterior cervical discectomy with fusion cervical three-four, four-five, and five sixCDF with Interbody Prosthesis, plating, and Bone Graft   . ANTERIOR CERVICAL DECOMP/DISCECTOMY FUSION N/A 10/16/2012   Procedure: CERVICAL SIX-SEVEN ANTERIOR CERVICAL DECOMPRESSION/DISCECTOMY FUSION WITH INTERBODY PROTHESIS PLATING BONEGRAFT WITH /POSSIBLE HARDWARE REMOVAL OLD PLATE;  Surgeon: Scott [name];  Location: [place] [facility] OR[facility];  Service: Neurosurgery;  Laterality: N/A;  . MULTIPLE TOOTH EXTRACTIONS    . OTHER SURGICAL HISTORY     surgery on cheekbone and head for fall 2000  . 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 RIGHT/LEFT HEART CATH AND CORONARY ANGIOGRAPHY N/A 09/23/2018   Procedure: RIGHT/LEFT HEART CATH AND CORONARY ANGIOGRAPHY and possible PCI/stent;  Surgeon: Scott Blanks [name];  [facility];  Service: Cardiovascular;  Laterality: N/A;  . SPINE SURGERY     2014 and 2016 - Dr Scott [name] . TONSILLECTOMY  Current Outpatient Medications  Medication Sig Dispense Refill  . ADVAIR DISKUS 250-50 MCG/DOSE AEPB INHALE 1 PUFF TWICE DAILY 180 each 1  . aspirin 325 MG EC tablet Take 81 mg by mouth daily.    . bisoprolol (ZEBETA) 5 MG tablet Take 1 tablet (5 mg total) by mouth daily. 90 tablet 3  . Buprenorphine HCl (BELBUCA) 450 MCG FILM Place inside cheek.    Marland Kitchen buPROPion (WELLBUTRIN SR) 150 MG 12 hr tablet     . diazepam (VALIUM) 5 MG tablet Take 5 mg by mouth 2 (two) times daily as needed for anxiety.    .  fluticasone (FLONASE) 50 MCG/ACT nasal spray Place 2 sprays into both nostrils daily.    Marland Kitchen gabapentin (NEURONTIN) 300 MG capsule TAKE 1 CAPSULE BY MOUTH 3 TIMES DAILY 90 capsule 0  . Ipratropium-Albuterol (COMBIVENT RESPIMAT) 20-100 MCG/ACT AERS respimat INHALE 1 PUFF BY MOUTH INTO THE LUNGS FOUR TIMES A DAY 12 g 3  . ipratropium-albuterol (DUONEB) 0.5-2.5 (3) MG/3ML SOLN INHALE THE CONTENTS OF 1 VIAL VIA NEBULIZER EVERY 6 HOURS AS DIRECTED FOR SHORTNESS OF BREATH OR WHEEZING 360 mL 0  . JANUMET 50-500 MG tablet TAKE 1 TABLET BY MOUTH ONCE A DAY 90 tablet 0  . NARCAN 4 MG/0.1ML LIQD nasal spray kit Place 1 spray into the nose daily as needed (For overdose).     Marland Kitchen nystatin (MYCOSTATIN) 100000 UNIT/ML suspension Take 5 mLs (500,000 Units total) by mouth 4 (four) times daily. 60 mL 0  . omeprazole (PRILOSEC) 40 MG capsule TAKE 1 CAPSULE BY MOUTH EVERY DAY 30 capsule 0  . oxyCODONE-acetaminophen (PERCOCET) 10-325 MG tablet Take 1 tablet by mouth every 4 (four) hours. 15 tablet 0  . sacubitril-valsartan (ENTRESTO) 49-51 MG Take 1 tablet by mouth 2 (two) times daily. 180 tablet 3  . VENTOLIN HFA 108 (90 Base) MCG/ACT inhaler INHALE 1 TO 2 PUFFS INTO THE LUNGS EVERY 6 HOURS AS NEEDED FOR WHEEZING OR SHORTNESS OF BREATH 18 g 0  . Vitamin D, Ergocalciferol, (DRISDOL) 1.25 MG (50000 UT) CAPS capsule TAKE ONE CAPSULE BY MOUTH ONCE WEEKLY 12 capsule 0  . furosemide (LASIX) 20 MG tablet Take 1 tablet (20 mg total) by mouth daily. 90 tablet 3  . rosuvastatin (CRESTOR) 10 MG tablet Take 1 tablet (10 mg total) by mouth daily. 90 tablet 3   No current facility-administered medications for this visit.    Allergies:   Penicillins and Cymbalta [duloxetine hcl]    Social History:  The patient  reports that he quit smoking about 5 months ago. His smoking use included cigarettes. He started smoking about 47 years ago. He has a 35.25 pack-year smoking history. He has never used smokeless tobacco. He reports current  alcohol use. He reports that he does not use drugs.   Family History:  The patient's family history includes Asthma in his brother and sister; COPD in his mother; Diabetes in his brother; Emphysema in his mother; Heart disease in his maternal uncle; Hyperlipidemia in his brother and sister; Hypertension in his brother, brother, and sister; Mental illness in his sister.    ROS: All other systems are reviewed and negative. Unless otherwise mentioned in H&P    PHYSICAL EXAM: VS:  BP (!) 100/52 (BP Location: Left Arm, Patient Position: Sitting, Cuff Size: Normal)   Pulse 78   Ht '5\' 8"'  (1.727 m)   Wt 197 lb 12.8 oz (89.7 kg)   SpO2 90%   BMI 30.08 kg/m  , BMI Body  mass index is 30.08 kg/m. GEN: Well nourished, well developed, in no acute distress HEENT: normal Neck: no JVD, carotid bruits, or masses Cardiac:RRR; distant heart sounds, no murmurs, rubs, or gallops,no edema  Respiratory: Coarse breath sounds with cough on deep inspiration, some crackling noted. GI: soft, nontender, nondistended, + BS MS: no deformity or atrophy Skin: warm and dry, no rash Neuro:  Strength and sensation are intact Psych: euthymic mood, full affect   EKG: Not completed this office visit  Recent Labs: 06/29/2019: ALT 15; B Natriuretic Peptide 124.2 06/30/2019: BUN 19; Creatinine, Ser 1.35; Hemoglobin 14.2; Magnesium 1.9; Platelets 199; Potassium 4.2; Sodium 140    Lipid Panel    Component Value Date/Time   CHOL 161 03/01/2018 0958   TRIG 140 05/26/2018 1209   HDL 40 03/01/2018 0958   CHOLHDL 4.0 03/01/2018 0958   LDLCALC 89 03/01/2018 0958      Wt Readings from Last 3 Encounters:  05/07/20 197 lb 12.8 oz (89.7 kg)  03/08/20 208 lb 6.4 oz (94.5 kg)  01/05/20 206 lb 12.8 oz (93.8 kg)      Other studies Reviewed: Echocardiogram 2019-01-12 1. Left ventricular ejection fraction, by visual estimation, is 30 to  35%. The left ventricle has moderate to severely decreased function. Left  ventricular  septal wall thickness was moderately increased. Moderately  increased left ventricular posterior  wall thickness. There is moderately increased left ventricular  hypertrophy.  2. Left ventricular diastolic parameters are consistent with Grade I  diastolic dysfunction (impaired relaxation).  3. The left ventricle demonstrates global hypokinesis.  4. Global right ventricle has mildly reduced systolic function.The right  ventricular size is normal. No increase in right ventricular wall  thickness.  5. Left atrial size was normal.  6. Right atrial size was normal.  7. The mitral valve is normal in structure. No evidence of mitral valve  regurgitation. No evidence of mitral stenosis.  8. The tricuspid valve is normal in structure. Tricuspid valve  regurgitation is not demonstrated.  9. The aortic valve is tricuspid. Aortic valve regurgitation is not  visualized. No evidence of aortic valve sclerosis or stenosis.  10. The pulmonic valve was normal in structure. Pulmonic valve  regurgitation is not visualized.  11. TR signal is inadequate for assessing pulmonary artery systolic  pressure.  12. The inferior vena cava is normal in size with greater than 50%  respiratory variability, suggesting right atrial pressure of 3 mmHg.  13. The average left ventricular global longitudinal strain is -9.2 %.   Cardiac Cath 09/23/2018 1. No angiographic evidence of CAD 2. Non-ischemic cardiomyopathy 3. Elevated filling pressures.   Recommendations: Medical management of non-ischemic cardiomyopathy. Pressures suggest continued volume overload. Continue IV diuresis.     ASSESSMENT AND PLAN:  1.  Chronic systolic heart failure: Class II symptoms, currently.  Tolerating higher dose of Entresto 4951 mg.  Blood pressure is optimal.  I will check a BMET to evaluate kidney function on his higher dose.  Also repeat echocardiogram in 1 month to evaluate his response to medication concerning ejection  fraction.  He is tolerating increased exercise and is walking more which is encouraging.  2.  Hypertension: Blood pressure is optimal for current systolic function.  Can consider adding spironolactone for optimal medical therapy, however he does not appear to have any decompensation and blood pressure is well controlled.  3.  O2 dependent COPD: He admits that he does not always use oxygen support when he is out walking as he is feeling  better and breathing better.  He is encouraged to use his oxygen to avoid hypoxia and fatigue.  He is due to have follow-up CT scan for lung cancer screening in 2 months.  4.  Hyperlipidemia: Labs are being followed by primary care.  Continue rosuvastatin 10 mg daily with goal of LDL less than 70.  Current medicines are reviewed at length with the patient today.  I have spent 25 mins dedicated to the care of this patient on the date of this encounter to include pre-visit review of records, assessment, management and diagnostic testing,with shared decision making.  Labs/ tests ordered today include: BMET, Echocardiogram  Phill Myron. West Pugh, ANP, [facility]   05/07/2020 10:42 AM    [facility] [place] [address] Office [phone] Fax [phone]  Notice: This dictation was prepared with Dragon dictation along with smaller phrase technology. Any transcriptional errors that result from this process are unintentional and may not be corrected upon review.

## 2020-05-07 ENCOUNTER — Encounter: Payer: Self-pay | Admitting: Adult Health

## 2020-05-07 ENCOUNTER — Other Ambulatory Visit: Payer: Self-pay

## 2020-05-07 ENCOUNTER — Ambulatory Visit (INDEPENDENT_AMBULATORY_CARE_PROVIDER_SITE_OTHER): Payer: Medicare HMO | Admitting: Adult Health

## 2020-05-07 VITALS — BP 100/52 | HR 78 | Ht 68.0 in | Wt 197.8 lb

## 2020-05-07 DIAGNOSIS — I5022 Chronic systolic (congestive) heart failure: Secondary | ICD-10-CM

## 2020-05-07 DIAGNOSIS — E7800 Pure hypercholesterolemia, unspecified: Secondary | ICD-10-CM

## 2020-05-07 DIAGNOSIS — J439 Emphysema, unspecified: Secondary | ICD-10-CM | POA: Diagnosis not present

## 2020-05-07 DIAGNOSIS — I428 Other cardiomyopathies: Secondary | ICD-10-CM

## 2020-05-07 LAB — BASIC METABOLIC PANEL
BUN/Creatinine Ratio: 14 (ref 9–20)
BUN: 14 mg/dL (ref 6–24)
CO2: 26 mmol/L (ref 20–29)
Calcium: 9.5 mg/dL (ref 8.7–10.2)
Chloride: 101 mmol/L (ref 96–106)
Creatinine, Ser: 1 mg/dL (ref 0.76–1.27)
Glucose: 107 mg/dL — ABNORMAL HIGH (ref 65–99)
Potassium: 4.9 mmol/L (ref 3.5–5.2)
Sodium: 142 mmol/L (ref 134–144)
eGFR: 87 mL/min/{1.73_m2} (ref >59)

## 2020-05-07 MED ORDER — FUROSEMIDE 20 MG PO TABS: 20.0000 mg | ORAL_TABLET | Freq: Every day | ORAL | 3 refills | Status: DC

## 2020-05-07 MED ORDER — ROSUVASTATIN CALCIUM 10 MG PO TABS: 10.0000 mg | ORAL_TABLET | Freq: Every day | ORAL | 3 refills | Status: DC

## 2020-05-07 NOTE — Patient Instructions (Addendum)
Medication Instructions:  Continue current medications  *If you need a refill on your cardiac medications before your next appointment, please call your pharmacy*   Lab Work: BMP Today  If you have labs (blood work) drawn today and your tests are completely normal, you will receive your results only by: Marland Kitchen MyChart Message (if you have MyChart) OR . A paper copy in the mail If you have any lab test that is abnormal or we need to change your treatment, we will call you to review the results.   Testing/Procedures: Your physician has requested that you have an echocardiogram. Echocardiography is a painless test that uses sound waves to create images of your heart. It provides your doctor with information about the size and shape of your heart and how well your heart's chambers and valves are working. This procedure takes approximately one hour. There are no restrictions for this procedure.   Follow-Up: At [facility], you and your health needs are our priority.  As part of our continuing mission to provide you with exceptional heart care, we have created designated Provider Care Teams.  These Care Teams include your primary Cardiologist (physician) and Advanced Practice Providers (APPs -  Physician Assistants and Nurse Practitioners) who all work together to provide you with the care you need, when you need it.  We recommend signing up for the patient portal called "MyChart".  Sign up information is provided on this After Visit Summary.  MyChart is used to connect with patients for Virtual Visits (Telemedicine).  Patients are able to view lab/test results, encounter notes, upcoming appointments, etc.  Non-urgent messages can be sent to your provider as well.   To learn more about what you can do with MyChart, go to [email or web address].    Your next appointment:   6 month(s)  The format for your next appointment:   In Person  Provider:   You may see Rollene Rotunda, MD or one of the  following Advanced Practice Providers on your designated Care Team:    Theodore Demark, PA-C  Joni Reining, DNP, ANP

## 2020-05-11 ENCOUNTER — Other Ambulatory Visit: Payer: Self-pay

## 2020-05-11 NOTE — Patient Outreach (Signed)
[facility]) Care Management  05/11/2020  Scott [name] Jun 10, 1960 [id number]   Telephone Assessment Quarterly Call   Unsuccessful attempt to reach pt.      Plan: RN CM will make quarterly outreach attempt to patient within the month of July.  Antionette Fairy, RN,BSN,CCM [facility] Care Management Telephonic Care Management Coordinator Direct Phone: [phone] Toll Free: [phone] Fax: [phone]

## 2020-05-14 ENCOUNTER — Other Ambulatory Visit: Payer: Self-pay | Admitting: Emergency Medicine

## 2020-06-04 ENCOUNTER — Other Ambulatory Visit: Payer: Self-pay

## 2020-06-04 ENCOUNTER — Ambulatory Visit (HOSPITAL_COMMUNITY): Payer: Medicare HMO | Attending: Cardiovascular Disease

## 2020-06-04 DIAGNOSIS — I428 Other cardiomyopathies: Secondary | ICD-10-CM | POA: Diagnosis not present

## 2020-06-04 DIAGNOSIS — I5022 Chronic systolic (congestive) heart failure: Secondary | ICD-10-CM | POA: Insufficient documentation

## 2020-06-04 LAB — ECHOCARDIOGRAM COMPLETE
Area-P 1/2: 3.31 cm2
S' Lateral: 2.7 cm

## 2020-06-24 ENCOUNTER — Other Ambulatory Visit: Payer: Self-pay | Admitting: Physician Assistant

## 2020-06-24 DIAGNOSIS — Z7951 Long term (current) use of inhaled steroids: Secondary | ICD-10-CM

## 2020-07-05 ENCOUNTER — Telehealth: Payer: Self-pay | Admitting: Emergency Medicine

## 2020-07-05 DIAGNOSIS — J9611 Chronic respiratory failure with hypoxia: Secondary | ICD-10-CM

## 2020-07-05 NOTE — Telephone Encounter (Signed)
Spoke with the pt  He states that he was never able to get the POC that we ordered him back in Dec 2022 due to insurance issue  He states that [place] told him that he would be eligible now and all we needed to do [place] place an order  I have placed order for this  Pt to keep planned f/u for 08/03/20  Nothing further needed

## 2020-07-06 ENCOUNTER — Telehealth: Payer: Self-pay | Admitting: Emergency Medicine

## 2020-07-06 DIAGNOSIS — J9601 Acute respiratory failure with hypoxia: Secondary | ICD-10-CM

## 2020-07-06 DIAGNOSIS — J9611 Chronic respiratory failure with hypoxia: Secondary | ICD-10-CM

## 2020-07-06 NOTE — Telephone Encounter (Signed)
I called and spoke with patient regarding [employer] and POC machine. [facility] does not have any POC machines and the patient is wanting to switch to another DME company that does offer POC machines. He was walked in the office on the POC in 12/21 does qualify. It looks like [employer] has called chan, [facility] already to inform her as well. Informed patient that I will send to our [facility] to see if they can find one for the patient that has POC and will accept insurance. Patient verbalized understanding. Will route to [facility] and put in order to change DME for POC machine.  [facility], please advise. Looks like [employer] has already reached out to [place]. Thanks!

## 2020-07-07 NOTE — Telephone Encounter (Signed)
Pt calling stating that he has received equipment he was needing [employer] but [facility] refuses to come pick up equipment w/o a signed order from Dr.. please call pt @ [phone] Caren Griffins

## 2020-07-07 NOTE — Telephone Encounter (Signed)
Called and spoke with patient. He stated that he has received everything he needs from [employer]. He wants to have an order to be sent to [employer] for them to D/C his oxygen since he is no longer using their equipment.   RB, please advise if you are ok with [place] placing this order. Thanks!

## 2020-07-08 NOTE — Telephone Encounter (Signed)
DME order has been sent to D/C the oxygen with [employer]>

## 2020-07-08 NOTE — Telephone Encounter (Signed)
Ok to d/c the [place] order if he is going to stick with [employer].

## 2020-07-19 ENCOUNTER — Other Ambulatory Visit: Payer: Self-pay | Admitting: Emergency Medicine

## 2020-07-22 ENCOUNTER — Telehealth: Payer: Self-pay | Admitting: Emergency Medicine

## 2020-07-22 NOTE — Telephone Encounter (Signed)
Checked pt's chart and saw where Rx for Advair Diskus had been sent to [facility] for him 07/19/20.  Attempted to call pt but unable to reach. Left message for him to return call.

## 2020-07-27 NOTE — Telephone Encounter (Signed)
[facility] pt. Spoke with a male and she states he is unavailable until Friday. Advised for him to return our call if still needed.

## 2020-08-03 ENCOUNTER — Encounter: Payer: Self-pay | Admitting: Emergency Medicine

## 2020-08-03 ENCOUNTER — Ambulatory Visit (INDEPENDENT_AMBULATORY_CARE_PROVIDER_SITE_OTHER): Payer: Medicare HMO | Admitting: Emergency Medicine

## 2020-08-03 ENCOUNTER — Other Ambulatory Visit: Payer: Self-pay

## 2020-08-03 DIAGNOSIS — J9611 Chronic respiratory failure with hypoxia: Secondary | ICD-10-CM | POA: Diagnosis not present

## 2020-08-03 DIAGNOSIS — J439 Emphysema, unspecified: Secondary | ICD-10-CM

## 2020-08-03 DIAGNOSIS — F17200 Nicotine dependence, unspecified, uncomplicated: Secondary | ICD-10-CM | POA: Diagnosis not present

## 2020-08-03 NOTE — Telephone Encounter (Signed)
Pt being seen today 08/03/20 by Dr. Delton Coombes. Will close encounter.

## 2020-08-03 NOTE — Progress Notes (Signed)
Subjective:    Patient ID: Scott [name], male    DOB: April 10, 1960, 60 y.o.   MRN: [id number]  HPI  ROV 01/05/20 -60 year old gentlemanleman with history tobacco use, COPD with associated severe obstruction was from a tree, exertional hypoxemia.  Also with cough, upper irritability in setting of GERD, allergic rhinitis.  He has had difficulty with smoke cessation he tells me that he just stopped 1 month ago.  We have been managing him on Advair and Combivent/DuoNeb, albuterol if needed - about 3-4x a day.  He also has Ventolin which she uses approximately.  He has documented desaturations and is supposed to use oxygen at 2 L/min with exertion.  Today he reports that he is doing fairly well. Occasional cough. He has flonase but isn't using right now. He is using omeprazole. Her reports that he quit smoking 1 month ago - has had some anxiety but is tolerating ok. He is on wellbutrin, follows with psych.  No flares, no pred or abx since hospitalized in June 21.   Lung cancer screening CT 08/12/2019 reviewed by me, shows RADS 2 study, diffuse bronchial wall thickening without consolidation or masses, tiny peripheral right upper lobe pulmonary nodule 2.3 mm.  No other nodules.  Would need to follow-up in 1 year.   ROV 08/03/20 --Scott [name] is 60, history of tobacco use with severe obstructive lung disease.  Also with GERD, allergic rhinitis and chronic cough.  Current regimen includes scheduled Advair, scheduled Combivent/DuoNeb qid.  He has albuterol which he uses approximately 3-4x a day. He has not been smoking for almost 1 year. No flares since last time.  Activity level is pretty good with O2 on. He is interested in possibly getting sildenifil for ED, wants pulmonary and cards input regarding this. He has daily cough, clear mucous. Minimal wheeze.  He was told that his ventolin was not approved by his insurance or was not filled?? Need to see if this is on his [facility] formulary. He prefers it to alternative  albuterol HFA He has documented desaturations with exertion, is now reliably on 4 liters per minute via POC that he obtained from [employer] COVID vaccine up to date.   He participates in lung cancer screening program, is due for routine follow-up CT this month.     No flowsheet data found.  Objective:   Physical Exam  Vitals:   08/03/20 0906  BP: 120/80  Pulse: 70  Temp: 99 F (37.2 C)  TempSrc: Oral  SpO2: 94%  Weight: 205 lb 12.8 oz (93.4 kg)  Height: 5\' 8"  (1.727 m)    Gen: Pleasant, well-nourished, in no distress  ENT: No lesions,  mouth clear,  oropharynx clear, no postnasal drip,   Neck: No JVD, no stridor  Lungs: mostly clear, soft end exp wheeze B L>R  Cardiovascular: RRR, heart sounds normal, no murmur or gallops, trace peripheral edema  Musculoskeletal: No deformities, no cyanosis or clubbing  Neuro: alert, appropriate, non-focal  Skin: Warm     Assessment & Plan:  COPD (chronic obstructive pulmonary disease) Please continue your Advair twice a day.  Rinse and gargle after using. You can use your Combivent or DuoNeb 4 times a day on a schedule. Keep your Ventolin (albuterol) available to use 2 puffs up to every 4 hours if needed for shortness of breath, chest tightness, wheezing.  We will work on determining whether you will still be able to get Ventolin through your . COVID 19 vaccine is up-to-date Follow with Dr  Keisa Blow in 6 months or sooner if you have any problems  Chronic respiratory failure with hypoxia (HCC) Continue your oxygen at 4 L/min pulsed with all exertiong.  Tobacco use disorder Congratulations on staying off cigarettes! We will work on scheduling your low-dose CT scan of the chest to continue lung cancer screening.   Levy Pupa, MD, PhD 09/24/2020, 1:52 PM [facility] [phone] or if no answer [phone]

## 2020-08-03 NOTE — Patient Instructions (Signed)
Please continue your Advair twice a day.  Rinse and gargle after using. You can use your Combivent or DuoNeb 4 times a day on a schedule. Keep your Ventolin (albuterol) available to use 2 puffs up to every 4 hours if needed for shortness of breath, chest tightness, wheezing.  We will work on determining whether you will still be able to get Ventolin through your [facility]. Congratulations on staying off cigarettes! COVID 19 vaccine is up-to-date Continue your oxygen at 4 L/min pulsed with all exertion From pulmonary standpoint there is no reason why you cannot use the medication sildenafil.  We will discuss with Dr. Antoine Poche as well. We will work on scheduling your low-dose CT scan of the chest to continue lung cancer screening. Follow with Dr Delton Coombes in 6 months or sooner if you have any problems

## 2020-08-09 ENCOUNTER — Other Ambulatory Visit: Payer: Self-pay | Admitting: Emergency Medicine

## 2020-08-10 ENCOUNTER — Other Ambulatory Visit: Payer: Self-pay

## 2020-08-10 NOTE — Patient Outreach (Signed)
[facility] Care Management  08/10/2020  Dong Nimmons 04/09/1960 [id number]   Telephone Assessment Case Closure    Patient no longer followed by [facility] PCP and not eligible for services.    Plan: RN CM will close case at this time.   Antionette Fairy, RN,BSN,CCM [facility] Care Management Telephonic Care Management Coordinator Direct Phone: [phone] Toll Free: [phone] Fax: [phone]

## 2020-08-11 ENCOUNTER — Telehealth: Payer: Self-pay | Admitting: Cardiology

## 2020-08-11 NOTE — Telephone Encounter (Signed)
Pt c/o medication issue:  1. Name of Medication:  Potassium  2. How are you currently taking this medication (dosage and times per day)?   3. Are you having a reaction (difficulty breathing--STAT)?   4. What is your medication issue?   Morrie Sheldon with [facility] states the patient has been complaining about leg cramps. She states she's aware the patient takes Furosemide, but she would like to know if he can begin taking Potassium as well for the cramping.  Morrie Sheldon requested that we return the call to the patient with advisement, but if any questions for her she can be reached at [phone] (ext#: [phone]).

## 2020-08-11 NOTE — Telephone Encounter (Signed)
Returned call to patient of Dr. Antoine Poche  He reports he is having cramping in his legs, states that it is coming from his back  His PCP is aware - did x-rays His pain management MD is aware He reports the pain goes down his back, deep into his thighs/calves Patient thinks he has sciatica and does not think cramping is due to needing K+  Reviewed labs and his K+ has always been WNL over almost almost last 2 years  Sildenafil --  Patient discussed with PCP and pulm about sildenafil.  Dr. Delton Coombes note: From pulmonary standpoint there is no reason why you cannot use the medication sildenafil.  We will discuss with Dr. Antoine Poche as well.  Advised patient will send message to MD about sildenafil use and prescription request

## 2020-08-23 ENCOUNTER — Other Ambulatory Visit: Payer: Self-pay | Admitting: *Deleted

## 2020-08-23 DIAGNOSIS — F17210 Nicotine dependence, cigarettes, uncomplicated: Secondary | ICD-10-CM

## 2020-08-24 NOTE — Telephone Encounter (Signed)
Spoke with pt, aware of dr hochrein's recommendations. Follow up scheduled per recall

## 2020-09-14 ENCOUNTER — Ambulatory Visit: Payer: Medicare HMO

## 2020-09-14 ENCOUNTER — Other Ambulatory Visit: Payer: Self-pay

## 2020-09-14 ENCOUNTER — Ambulatory Visit
Admission: RE | Admit: 2020-09-14 | Discharge: 2020-09-14 | Disposition: A | Discharge status: Home / Self Care | Payer: Medicare HMO | Source: Ambulatory Visit | Attending: Acute Care | Admitting: Acute Care

## 2020-09-14 DIAGNOSIS — F17210 Nicotine dependence, cigarettes, uncomplicated: Secondary | ICD-10-CM

## 2020-09-24 ENCOUNTER — Encounter: Payer: Self-pay | Admitting: Emergency Medicine

## 2020-09-24 NOTE — Assessment & Plan Note (Signed)
Please continue your Advair twice a day.  Rinse and gargle after using. You can use your Combivent or DuoNeb 4 times a day on a schedule. Keep your Ventolin (albuterol) available to use 2 puffs up to every 4 hours if needed for shortness of breath, chest tightness, wheezing.  We will work on determining whether you will still be able to get Ventolin through your [facility]. COVID 19 vaccine is up-to-date Follow with Dr Delton Coombes in 6 months or sooner if you have any problems

## 2020-09-24 NOTE — Assessment & Plan Note (Signed)
Congratulations on staying off cigarettes! We will work on scheduling your low-dose CT scan of the chest to continue lung cancer screening.

## 2020-09-24 NOTE — Assessment & Plan Note (Signed)
Continue your oxygen at 4 L/min pulsed with all exertiong.

## 2020-09-25 NOTE — Progress Notes (Signed)
I have called the patient with the results of his low dose Ct chest. I explained that his scan was read as a Lung  RADS 3, nodules that are probably benign findings, short term follow up suggested: includes nodules with a low likelihood of becoming a clinically active cancer. Radiology recommends a 6 month repeat LDCT follow up. I explained that he has a 5.1 mm nodule in his left lower lobe that we need to re-evaluate in 6 months for stability, resolution or growth, he is in agreement with this plan. Angelique Blonder, please fax results to PCP and schedule 6 month follow up . Thanks so much

## 2020-09-28 ENCOUNTER — Other Ambulatory Visit: Payer: Self-pay | Admitting: *Deleted

## 2020-09-28 DIAGNOSIS — Z87891 Personal history of nicotine dependence: Secondary | ICD-10-CM

## 2020-09-28 DIAGNOSIS — R911 Solitary pulmonary nodule: Secondary | ICD-10-CM

## 2020-10-12 ENCOUNTER — Telehealth: Payer: Self-pay | Admitting: Emergency Medicine

## 2020-10-12 NOTE — Telephone Encounter (Signed)
Will route to [employer] to follow up on CT scan.

## 2020-10-12 NOTE — Telephone Encounter (Signed)
Left message for pt to call back  °

## 2020-10-14 NOTE — Telephone Encounter (Signed)
Spoke with pt and clarified that Kandice Robinsons, NP has discussed CT results with pt. Pt verbalized understanding and is aware that we will contact her in 6 months to schedule next his CT.

## 2020-11-15 ENCOUNTER — Ambulatory Visit: Payer: Medicare HMO | Admitting: Cardiology

## 2020-11-21 NOTE — Progress Notes (Signed)
Cardiology Clinic Note   Patient Name: Scott [name] Date of Encounter: 11/23/2020  Primary Care Provider:  Fredrich [name], [place] Primary Cardiologist:  Scott Breeding, MD  Patient Profile    Scott [name] 60 year old male presents the clinic today for follow-up evaluation of his chronic systolic CHF and hypertension.  Past Medical History    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   COPD (chronic obstructive pulmonary disease) ([place])    Diabetes mellitus without complication ([place])    GERD (gastroesophageal reflux disease)    Headache(784.0)    Pneumonia    hx   Past Surgical History:  Procedure Laterality Date   ANTERIOR CERVICAL DECOMP/DISCECTOMY FUSION  12/22/2010   Procedure: ANTERIOR CERVICAL DECOMPRESSION/DISCECTOMY FUSION 3 LEVELS;  Surgeon: Ophelia Charter;  Location: [facility];  Service: Neurosurgery;  Laterality: N/A;  Anterior cervical discectomy with fusion cervical three-four, four-five, and five sixCDF with Interbody Prosthesis, plating, and Bone Graft    ANTERIOR CERVICAL DECOMP/DISCECTOMY FUSION N/A 10/16/2012   Procedure: CERVICAL SIX-SEVEN ANTERIOR CERVICAL DECOMPRESSION/DISCECTOMY FUSION WITH INTERBODY PROTHESIS PLATING BONEGRAFT WITH /POSSIBLE HARDWARE REMOVAL OLD PLATE;  Surgeon: Ophelia Charter, MD;  Location: [facility];  Service: Neurosurgery;  Laterality: N/A;   MULTIPLE TOOTH EXTRACTIONS     OTHER SURGICAL HISTORY     surgery on cheekbone and head for fall 2000   OTHER SURGICAL HISTORY     states when about 60yr old he was urinating blood, 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 and  had surgery for this   RIGHT/LEFT HEART CATH AND CORONARY ANGIOGRAPHY N/A 09/23/2018   Procedure: RIGHT/LEFT HEART CATH AND CORONARY ANGIOGRAPHY and possible PCI/stent;  Surgeon: MBurnell Blanks MD;  [facility];  Service: Cardiovascular;  Laterality: N/A;   SPINE SURGERY     2014 and 2016 -  Dr JArnoldo Morale  TONSILLECTOMY      Allergies  Allergies  Allergen Reactions   Penicillins Anaphylaxis    Has patient had a PCN reaction causing immediate rash, facial/tongue/throat swelling, SOB or lightheadedness with hypotension: Yes Has patient had a PCN reaction causing severe rash involving mucus membranes or skin necrosis: Yes Has patient had a PCN reaction that required hospitalization: Yes Has patient had a PCN reaction occurring within the last 10 years: No If all of the above answers are "NO", then may proceed with Cephalosporin use.    Cymbalta [Duloxetine Hcl]     "crazy thoughts"    History of Present Illness    Scott [name] a PMH of chronic systolic CHF, nonischemic cardiomyopathy, hypertension, COPD, chronic respiratory failure, GERD, diabetes mellitus, CKD, hyperlipidemia, tachycardia, tobacco use, and chronic pain syndrome.  He underwent cardiac catheterization 09/23/2018 which showed no evidence of CAD, nonischemic cardiomyopathy, and elevated filling pressures.  Echocardiogram 06/04/2020 showed recovered EF of 60-65%, G1 DD, trivial mitral valve regurgitation and no other significant valvular abnormalities.  He followed up with Scott Sims DNP on 05/07/2020.  During that time he was doing fairly well.  He did note occasional palpitations.  He reported as if at times it felt like his heart would stop.  He continued his Entresto 471-51and was tolerating it well.  He was walking daily and completing around 2 miles per day.  His son lives a block and a half away from his house and he would walk back and forth 3 times per day for exercise.  He was no longer having to care for his grandchildren.  He reported that his home was much more peaceful and he was enjoying time with his wife.  He presents the clinic today for follow-up evaluation and states his sister passed away recently in [place].  He is planning to leave after his appointment today with family to go to her  funeral in the [place] area.  3 weeks ago with the added stress of his sister's failing health he started smoking again.  He also reports that he has been using his oxygen as needed.  He reports that he occasionally has right-sided chest pressure at rest.  His episodes are intermittent and brief.  In the office today his oxygen saturation was 81%.  He denies exertional chest discomfort today and shortness of breath today.  We reviewed the importance of wearing his home oxygen and not smoking.  He expressed understanding.  His blood pressure has been well controlled.  He reports compliance with his medications and denies side effects.  I will give him the salty 6 diet sheet, smoking cessation information, and plan follow-up for 6 months.  Today he denies chest pain, shortness of breath, lower extremity edema, fatigue, palpitations, melena, hematuria, hemoptysis, diaphoresis, weakness, presyncope, syncope, orthopnea, and PND.   Home Medications    Prior to Admission medications   Medication Sig Start Date End Date Taking? Authorizing Provider  ADVAIR DISKUS 250-50 MCG/ACT AEPB INHALE 1 PUFF TWICE DAILY 07/19/20   Collene Gobble, MD  aspirin 325 MG EC tablet Take 81 mg by mouth daily.    [provider]  bisoprolol (ZEBETA) 5 MG tablet Take 1 tablet (5 mg total) by mouth daily. 03/08/20   Scott Breeding, MD  Buprenorphine HCl (BELBUCA) 450 MCG FILM Place inside cheek.    [provider]  buPROPion [facility] SR) 150 MG 12 hr tablet  07/03/19   [provider]  diazepam (VALIUM) 5 MG tablet Take 5 mg by mouth 2 (two) times daily as needed for anxiety. 08/07/18   [provider]  fluticasone (FLONASE) 50 MCG/ACT nasal spray USE 2 SPRAYS NASALLY EVERY DAY 05/17/20   Collene Gobble, MD  furosemide (LASIX) 20 MG tablet Take 1 tablet (20 mg total) by mouth daily. 05/07/20 08/05/20  Lendon Colonel, NP  gabapentin (NEURONTIN) 300 MG capsule TAKE 1 CAPSULE BY  MOUTH 3 TIMES DAILY 11/21/18   Jacelyn Pi, Lilia Argue, MD  Ipratropium-Albuterol (COMBIVENT RESPIMAT) 20-100 MCG/ACT AERS respimat INHALE 1 PUFF FOUR TIMES DAILY 08/10/20   Collene Gobble, MD  ipratropium-albuterol (DUONEB) 0.5-2.5 (3) MG/3ML SOLN INHALE THE CONTENTS OF 1 VIAL VIA NEBULIZER EVERY 6 HOURS AS NEEDED 05/17/20   Collene Gobble, MD  JANUMET 50-500 MG tablet TAKE 1 TABLET BY MOUTH ONCE A DAY 07/31/18   Jacelyn Pi, Lilia Argue, MD  [facility] 4 MG/0.1ML LIQD nasal spray kit Place 1 spray into the nose daily as needed (For overdose).  06/13/18   [provider]  nystatin (MYCOSTATIN) 100000 UNIT/ML suspension Take 5 mLs (500,000 Units total) by mouth 4 (four) times daily. 03/08/20   Scott Breeding, MD  omeprazole (PRILOSEC) 40 MG capsule TAKE 1 CAPSULE BY MOUTH EVERY DAY 01/08/19   Jacelyn Pi, Lilia Argue, MD  oxyCODONE-acetaminophen (PERCOCET) 10-325 MG tablet Take 1 tablet by mouth every 4 (four) hours. 05/28/18   Oretha Milch D, MD  rosuvastatin (CRESTOR) 10 MG tablet Take 1 tablet (10 mg total) by mouth daily. 05/07/20  Lendon Colonel, NP  sacubitril-valsartan (ENTRESTO) 49-51 MG Take 1 tablet by mouth 2 (two) times daily. 03/08/20   Scott Breeding, MD  VENTOLIN HFA 108 (90 Base) MCG/ACT inhaler INHALE 1 TO 2 PUFFS INTO THE LUNGS EVERY 6 HOURS AS NEEDED FOR WHEEZING OR SHORTNESS OF BREATH 02/16/20   Byrum, Rose Fillers, MD  Vitamin D, Ergocalciferol, (DRISDOL) 1.25 MG (50000 UT) CAPS capsule TAKE ONE CAPSULE BY MOUTH ONCE WEEKLY 12/18/18   Jacelyn Pi, Lilia Argue, MD    Family History    Family History  Problem Relation Age of Onset   Emphysema Mother    COPD Mother    Asthma Brother    Diabetes Brother    Hyperlipidemia Brother    Hypertension Brother    Asthma Sister    Hyperlipidemia Sister    Hypertension Sister    Mental illness Sister    Heart disease Maternal Uncle    Hypertension Brother    He indicated that his mother is alive. He indicated that his father is deceased.  He indicated that the status of his sister is unknown. He indicated that the status of his maternal uncle is unknown.  Social History    Social History   Socioeconomic History   Marital status: Married    Spouse name: Not on file   Number of children: 4   Years of education: Not on file   Highest education level: Not on file  Occupational History   Occupation: DISABLED  Tobacco Use   Smoking status: Former    Packs/day: 0.75    Years: 47.00    Pack years: 35.25    Types: Cigarettes    Start date: 01/23/1973    Quit date: 12/03/2019    Years since quitting: 0.9   Smokeless tobacco: Never  Vaping Use   Vaping Use: Never used  Substance and Sexual Activity   Alcohol use: Yes    Comment: Beer occasionally   Drug use: No   Sexual activity: Not Currently  Other Topics Concern   Not on file  Social History Narrative   Not on file   Social Determinants of Health   Financial Resource Strain: Not on file  Food Insecurity: Not on file  Transportation Needs: Not on file  Physical Activity: Not on file  Stress: Not on file  Social Connections: Not on file  Intimate Partner Violence: Not on file     Review of Systems    General:  No chills, fever, night sweats or weight changes.  Cardiovascular:  No chest pain, dyspnea on exertion, edema, orthopnea, palpitations, paroxysmal nocturnal dyspnea. Dermatological: No rash, lesions/masses Respiratory: No cough, dyspnea Urologic: No hematuria, dysuria Abdominal:   No nausea, vomiting, diarrhea, bright red blood per rectum, melena, or hematemesis Neurologic:  No visual changes, wkns, changes in mental status. All other systems reviewed and are otherwise negative except as noted above.  Physical Exam    VS:  BP 110/66 (BP Location: Left Arm, Patient Position: Sitting, Cuff Size: Normal)   Pulse 76   Ht '5\' 8"'  (1.727 m)   Wt 195 lb 9.6 oz (88.7 kg)   SpO2 (!) 81%   BMI 29.74 kg/m  , BMI Body mass index is 29.74 kg/m. GEN: Well  nourished, well developed, in no acute distress. HEENT: normal. Neck: Supple, no JVD, carotid bruits, or masses. Cardiac: RRR, no murmurs, rubs, or gallops. No clubbing, cyanosis, edema.  Radials/DP/PT 2+ and equal bilaterally.  Respiratory:  Respirations regular and unlabored, clear  to auscultation bilaterally. GI: Soft, nontender, nondistended, BS + x 4. MS: no deformity or atrophy. Skin: warm and dry, no rash. Neuro:  Strength and sensation are intact. Psych: Normal affect.  Accessory Clinical Findings    Recent Labs: 05/07/2020: BUN 14; Creatinine, Ser 1.00; Potassium 4.9; Sodium 142   Recent Lipid Panel    Component Value Date/Time   CHOL 161 03/01/2018 0958   TRIG 140 05/26/2018 1209   HDL 40 03/01/2018 0958   CHOLHDL 4.0 03/01/2018 0958   LDLCALC 89 03/01/2018 0958    ECG personally reviewed by me today-none today.  Echocardiogram 06/04/2020  IMPRESSIONS     1. Left ventricular ejection fraction, by estimation, is 60 to 65%. The  left ventricle has normal function. The left ventricle has no regional  wall motion abnormalities. Left ventricular diastolic parameters are  consistent with Grade I diastolic  dysfunction (impaired relaxation).   2. Right ventricular systolic function is normal. The right ventricular  size is normal. There is normal pulmonary artery systolic pressure.   3. The mitral valve is normal in structure. Trivial mitral valve  regurgitation. No evidence of mitral stenosis.   4. The aortic valve is tricuspid. Aortic valve regurgitation is not  visualized. No aortic stenosis is present.   5. The inferior vena cava is normal in size with greater than 50%  respiratory variability, suggesting right atrial pressure of 3 mmHg.   Assessment & Plan   1.  Chronic systolic CHF-no increased DOE or activity intolerance.  NYHA class II today.  Follow-up echocardiogram showed recovered EF 60-65%.  Continues to be physically active walking 2 or more miles  daily. Continue Entresto, furosemide Heart healthy low-sodium diet-salty 6 given Maintain physical activity as tolerated  Essential hypertension-BP today 110/66.  Well-controlled at home. Continue Entresto, bisoprolol Heart healthy low-sodium diet-salty 6 given Maintain physical activity as tolerated  Hyperlipidemia-LDL 89 03/01/18 Heart healthy low-sodium high-fiber diet-high-fiber options reviewed. Maintain physical activity as tolerated Follows with PCP  COPD-oxygen dependent.  Continues to feel better with exercise.  Using oxygen consistently.  Lung CT 10/15/2020 showed lung nodules that were felt to be benign.  Repeat lung CT in February 2023 recommended. Follows with pulmonology  Tobacco abuse- Starting smoking again 3 weeks ago. Discuss cessation and importance. Cessation information given   Disposition: Follow-up with Dr. Percival Spanish or me in 6 months.   Jossie Ng. Shaymus Eveleth NP-C    11/23/2020, 8:20 AM [address] Office [phone] Fax [phone]  Notice: This dictation was prepared with Dragon dictation along with smaller phrase technology. Any transcriptional errors that result from this process are unintentional and may not be corrected upon review.  I spent 14 minutes examining this patient, reviewing medications, and using patient centered shared decision making involving her cardiac care.  Prior to her visit I spent greater than 20 minutes reviewing her past medical history,  medications, and prior cardiac tests.

## 2020-11-23 ENCOUNTER — Encounter: Payer: Self-pay | Admitting: General Practice

## 2020-11-23 ENCOUNTER — Ambulatory Visit (INDEPENDENT_AMBULATORY_CARE_PROVIDER_SITE_OTHER): Payer: Medicare HMO | Admitting: General Practice

## 2020-11-23 ENCOUNTER — Other Ambulatory Visit: Payer: Self-pay

## 2020-11-23 VITALS — BP 110/66 | HR 76 | Ht 68.0 in | Wt 195.6 lb

## 2020-11-23 DIAGNOSIS — J441 Chronic obstructive pulmonary disease with (acute) exacerbation: Secondary | ICD-10-CM | POA: Diagnosis not present

## 2020-11-23 DIAGNOSIS — E7800 Pure hypercholesterolemia, unspecified: Secondary | ICD-10-CM

## 2020-11-23 DIAGNOSIS — I10 Essential (primary) hypertension: Secondary | ICD-10-CM | POA: Diagnosis not present

## 2020-11-23 DIAGNOSIS — I5022 Chronic systolic (congestive) heart failure: Secondary | ICD-10-CM

## 2020-11-23 NOTE — Patient Instructions (Signed)
Medication Instructions:  The current medical regimen is effective;  continue present plan and medications as directed. Please refer to the Current Medication list given to you today.   *If you need a refill on your cardiac medications before your next appointment, please call your pharmacy*  Lab Work:   Testing/Procedures:  NONE    NONE  Special Instructions PLEASE READ AND FOLLOW SALTY 6-ATTACHED-1,800mg  daily  PLEASE INCREASE PHYSICAL ACTIVITY AS TOLERATED  TAKE AND LOG YOUR WEIGHT DAILY  READ AND FOLLOW CESSATION TIPS  MAKE SURE TO WEAR YOUR OXYGEN CONTINUOUSLY   Follow-Up: Your next appointment:  6 month(s) In Person with Rollene Rotunda, MD OR IF UNAVAILABLE JESSE CLEAVER, FNP-C   Please call our office 2 months in advance to schedule this appointment   At [facility], you and your health needs are our priority.  As part of our continuing mission to provide you with exceptional heart care, we have created designated Provider Care Teams.  These Care Teams include your primary Cardiologist (physician) and Advanced Practice Providers (APPs -  Physician Assistants and Nurse Practitioners) who all work together to provide you with the care you need, when you need it.            6 SALTY THINGS TO AVOID     1,800MG  DAILY     Steps to Quit Smoking Smoking tobacco is the leading cause of preventable death. It can affect almost every organ in the body. Smoking puts you and people around you at risk for many serious, long-lasting (chronic) diseases. Quitting smoking can be hard, but it is one of the best things that you can do for your health. It is never too late to quit. How do I get ready to quit? When you decide to quit smoking, make a plan to help you succeed. Before you quit: Pick a date to quit. Set a date within the next 2 weeks to give you time to prepare. Write down the reasons why you are quitting. Keep this list in places where you will see it often. Tell your family,  friends, and co-workers that you are quitting. Their support is important. Talk with your doctor about the choices that may help you quit. Find out if your health insurance will pay for these treatments. Know the people, places, things, and activities that make you want to smoke (triggers). Avoid them. What first steps can I take to quit smoking? Throw away all cigarettes at home, at work, and in your car. Throw away the things that you use when you smoke, such as ashtrays and lighters. Clean your car. Make sure to empty the ashtray. Clean your home, including curtains and carpets. What can I do to help me quit smoking? Talk with your doctor about taking medicines and seeing a counselor at the same time. You are more likely to succeed when you do both. If you are pregnant or breastfeeding, talk with your doctor about counseling or other ways to quit smoking. Do not take medicine to help you quit smoking unless your doctor tells you to do so. To quit smoking: Quit right away Quit smoking totally, instead of slowly cutting back on how much you smoke over a period of time. Go to counseling. You are more likely to quit if you go to counseling sessions regularly. Take medicine You may take medicines to help you quit. Some medicines need a prescription, and some you can buy over-the-counter. Some medicines may contain a drug called nicotine to replace  the nicotine in cigarettes. Medicines may: Help you to stop having the desire to smoke (cravings). Help to stop the problems that come when you stop smoking (withdrawal symptoms). Your doctor may ask you to use: Nicotine patches, gum, or lozenges. Nicotine inhalers or sprays. Non-nicotine medicine that is taken by mouth. Find resources Find resources and other ways to help you quit smoking and remain smoke-free after you quit. These resources are most helpful when you use them often. They include: Online chats with a Veterinary surgeon. Phone  quitlines. Printed Materials engineer. Support groups or group counseling. Text messaging programs. Mobile phone apps. Use apps on your mobile phone or tablet that can help you stick to your quit plan. There are many free apps for mobile phones and tablets as well as websites. Examples include Quit Guide from the [employer] and smokefree.gov  What things can I do to make it easier to quit?  Talk to your family and friends. Ask them to support and encourage you. Call a phone quitline (1-800-QUIT-NOW), reach out to support groups, or work with a Veterinary surgeon. Ask people who smoke to not smoke around you. Avoid places that make you want to smoke, such as: Bars. Parties. Smoke-break areas at work. Spend time with people who do not smoke. Lower the stress in your life. Stress can make you want to smoke. Try these things to help your stress: Getting regular exercise. Doing deep-breathing exercises. Doing yoga. Meditating. Doing a body scan. To do this, close your eyes, focus on one area of your body at a time from head to toe. Notice which parts of your body are tense. Try to relax the muscles in those areas. How will I feel when I quit smoking? Day 1 to 3 weeks Within the first 24 hours, you may start to have some problems that come from quitting tobacco. These problems are very bad 2-3 days after you quit, but they do not often last for more than 2-3 weeks. You may get these symptoms: Mood swings. Feeling restless, nervous, angry, or annoyed. Trouble concentrating. Dizziness. Strong desire for high-sugar foods and nicotine. Weight gain. Trouble pooping (constipation). Feeling like you may vomit (nausea). Coughing or a sore throat. Changes in how the medicines that you take for other issues work in your body. Depression. Trouble sleeping (insomnia). Week 3 and afterward After the first 2-3 weeks of quitting, you may start to notice more positive results, such as: Better sense of smell and  taste. Less coughing and sore throat. Slower heart rate. Lower blood pressure. Clearer skin. Better breathing. Fewer sick days. Quitting smoking can be hard. Do not give up if you fail the first time. Some people need to try a few times before they succeed. Do your best to stick to your quit plan, and talk with your doctor if you have any questions or concerns. Summary Smoking tobacco is the leading cause of preventable death. Quitting smoking can be hard, but it is one of the best things that you can do for your health. When you decide to quit smoking, make a plan to help you succeed. Quit smoking right away, not slowly over a period of time. When you start quitting, seek help from your doctor, family, or friends. This information is not intended to replace advice given to you by your health care provider. Make sure you discuss any questions you have with your health care provider. Document Revised: 10/04/2018 Document Reviewed: 03/30/2018 Elsevier Patient Education  2022 [employer].

## 2020-12-08 ENCOUNTER — Other Ambulatory Visit: Payer: Self-pay

## 2020-12-08 ENCOUNTER — Ambulatory Visit
Admission: RE | Admit: 2020-12-08 | Discharge: 2020-12-08 | Disposition: A | Discharge status: Home / Self Care | Payer: Medicare HMO | Source: Ambulatory Visit | Attending: Physician Assistant | Admitting: Physician Assistant

## 2020-12-08 DIAGNOSIS — Z7951 Long term (current) use of inhaled steroids: Secondary | ICD-10-CM

## 2021-01-03 ENCOUNTER — Other Ambulatory Visit: Payer: Self-pay | Admitting: Cardiology

## 2021-01-21 ENCOUNTER — Other Ambulatory Visit: Payer: Self-pay | Admitting: Emergency Medicine

## 2021-01-21 DIAGNOSIS — J449 Chronic obstructive pulmonary disease, unspecified: Secondary | ICD-10-CM

## 2021-02-15 ENCOUNTER — Ambulatory Visit (INDEPENDENT_AMBULATORY_CARE_PROVIDER_SITE_OTHER): Payer: Medicare HMO | Admitting: Podiatry

## 2021-02-15 ENCOUNTER — Encounter: Payer: Self-pay | Admitting: Podiatry

## 2021-02-15 ENCOUNTER — Other Ambulatory Visit: Payer: Self-pay

## 2021-02-15 DIAGNOSIS — E119 Type 2 diabetes mellitus without complications: Secondary | ICD-10-CM

## 2021-02-15 DIAGNOSIS — G59 Mononeuropathy in diseases classified elsewhere: Secondary | ICD-10-CM

## 2021-02-15 NOTE — Progress Notes (Signed)
°  Subjective:  Patient ID: Scott [name], male    DOB: 04/24/1960,   MRN: [id number]  Chief Complaint  Patient presents with   debride    Pt is here for [facility]    61 y.o. male presents for diabetic foot check. Relates he gets numbness burning and tingling in his feet. Is currently taking gabapentin 300 mg TID. Relates he has a history of back pain originally in the neck but now having lower back pain. Also relates numbness in his fingers.   Patient is diabetic and last A1c was 7. . Denies any other pedal complaints. Denies n/v/f/c.   PCP: Burnice Logan PA  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   COPD (chronic obstructive pulmonary disease) (HCC)    Diabetes mellitus without complication (H[facility])    GERD (gastroesophageal reflux disease)    Headache(784.0)    Pneumonia    hx    Objective:  Physical Exam: Vascular: DP/PT pulses 2/4 bilateral. CFT <3 seconds. Normal hair growth on digits. No edema.  Skin. No lacerations or abrasions bilateral feet. Nails 1-5 are thickened discolored and with subungual debris. Xerosis noted bilateral plantar feet.  Musculoskeletal: MMT 5/5 bilateral lower extremities in DF, PF, Inversion and Eversion. Deceased ROM in DF of ankle joint.  Neurological: Sensation intact to light touch. Protective sensation absent.   Assessment:   1. Diabetes mellitus without complication (HCC)   2. 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 -Discussed and educated patient on diabetic foot care, especially with regards to the vascular, neurological and musculoskeletal systems.  -Stressed the importance of good glycemic control and the detriment of not  controlling glucose levels in relation to the foot. -Discussed supportive shoes at all times and checking feet regularly.  -Discussed talking with PCP to increase dosage of gabapentin.   -Answered all patient questions -Patient to return  in 1 year for diabetic foot check.  -Patient advised to call the office if any problems or questions arise in the meantime.   Louann Sjogren, DPM

## 2021-03-09 NOTE — Progress Notes (Signed)
Cardiology Clinic Note   Patient Name: Scott [name] Date of Encounter: 03/10/2021  Primary Care Provider:  Fredrich [name], Scott [name] Primary Cardiologist:  Minus Breeding, MD  Patient Profile    Scott [name] 61 year old male presents the clinic today for follow-up evaluation of his chronic systolic CHF and hypertension.  Past Medical History    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   COPD (chronic obstructive pulmonary disease) ([place])    Diabetes mellitus without complication ([place])    GERD (gastroesophageal reflux disease)    Headache(784.0)    Pneumonia    hx   Past Surgical History:  Procedure Laterality Date   ANTERIOR CERVICAL DECOMP/DISCECTOMY FUSION  12/22/2010   Procedure: ANTERIOR CERVICAL DECOMPRESSION/DISCECTOMY FUSION 3 LEVELS;  Surgeon: Ophelia Charter;  Location: [facility];  Service: Neurosurgery;  Laterality: N/A;  Anterior cervical discectomy with fusion cervical three-four, four-five, and five sixCDF with Interbody Prosthesis, plating, and Bone Graft    ANTERIOR CERVICAL DECOMP/DISCECTOMY FUSION N/A 10/16/2012   Procedure: CERVICAL SIX-SEVEN ANTERIOR CERVICAL DECOMPRESSION/DISCECTOMY FUSION WITH INTERBODY PROTHESIS PLATING BONEGRAFT WITH /POSSIBLE HARDWARE REMOVAL OLD PLATE;  Surgeon: Ophelia Charter, MD;  Location: [facility];  Service: Neurosurgery;  Laterality: N/A;   MULTIPLE TOOTH EXTRACTIONS     OTHER SURGICAL HISTORY     surgery on cheekbone and head for fall 2000   OTHER SURGICAL HISTORY     states when about 61yr old he was urinating blood, 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 and  had surgery for this   RIGHT/LEFT HEART CATH AND CORONARY ANGIOGRAPHY N/A 09/23/2018   Procedure: RIGHT/LEFT HEART CATH AND CORONARY ANGIOGRAPHY and possible PCI/stent;  Surgeon: MBurnell Blanks MD;  [facility];  Service: Cardiovascular;  Laterality: N/A;   SPINE SURGERY     2014 and 2016  - Dr JArnoldo Morale  TONSILLECTOMY      Allergies  Allergies  Allergen Reactions   Penicillins Anaphylaxis    Has patient had a PCN reaction causing immediate rash, facial/tongue/throat swelling, SOB or lightheadedness with hypotension: Yes Has patient had a PCN reaction causing severe rash involving mucus membranes or skin necrosis: Yes Has patient had a PCN reaction that required hospitalization: Yes Has patient had a PCN reaction occurring within the last 10 years: No If all of the above answers are "NO", then may proceed with Cephalosporin use.    Cymbalta [Duloxetine Hcl]     "crazy thoughts"    History of Present Illness    Scott Mahonehas a PMH of chronic systolic CHF, nonischemic cardiomyopathy, hypertension, COPD, chronic respiratory failure, GERD, diabetes mellitus, CKD, hyperlipidemia, tachycardia, tobacco use, and chronic pain syndrome.  He underwent cardiac catheterization 09/23/2018 which showed no evidence of CAD, nonischemic cardiomyopathy, and elevated filling pressures.  Echocardiogram 06/04/2020 showed recovered EF of 60-65%, G1 DD, trivial mitral valve regurgitation and no other significant valvular abnormalities.  He followed up with KJory Sims DNP on 05/07/2020.  During that time he was doing fairly well.  He did note occasional palpitations.  He reported as if at times it felt like his heart would stop.  He continued his Entresto 447-51and was tolerating it well.  He was walking daily and completing around 2 miles per day.  His son lives a block and a half away from his house and he would walk back and forth 3 times per day for  exercise.  He was no longer having to care for his grandchildren.  He reported that his home was much more peaceful and he was enjoying time with his wife.  He presented to the clinic 11/23/2020 for follow-up evaluation and stated his sister passed away  in [place].  He was planning to leave after his appointment to go to her funeral in the  [place] area.  3 weeks before with the added stress of his sister's failing health he started smoking again.  He also reported that he had been using his oxygen as needed.  He reported that he occasionally had right-sided chest pressure at rest.  His episodes were intermittent and brief.  In the office  his oxygen saturation was 81%.  He denied exertional chest discomfort  and shortness of breath today.  We reviewed the importance of wearing his home oxygen and not smoking.  He expressed understanding.  His blood pressure had been well controlled.  He reported compliance with his medications and denied side effects.  I will gave him the salty 6 diet sheet, smoking cessation information, and planned follow-up for 6 months.  He presents to the clinic today for follow-up evaluation states he traveled back and forth from [place] a total of 3 times.  He reports that the drive was very difficult because he was doing all the driving.  He and his wife travel together.  He reports that he continues to smoke cigarettes about 6/day since his sister passed away.  We reviewed the importance of smoking cessation.  His weight has decreased to around 180 pounds.  He reports that his weight has remained stable and he continues to follow a heart healthy low-sodium diet.  I will decrease his Lasix to every other day.  He does note some chest tightness with deep inspiration.  This appears to be related to his COPD.  His blood pressure continues to be well controlled.  I will order fasting lipids and LFTs today, give him aspirin sample, give smoking cessation information, and plan follow-up for 6 to 9 months.  Today he denies chest pain, increased shortness of breath, lower extremity edema, fatigue, palpitations, melena, hematuria, hemoptysis, diaphoresis, weakness, presyncope, syncope, orthopnea, and PND.   Home Medications    Prior to Admission medications   Medication Sig Start Date End Date Taking?  Authorizing Provider  ADVAIR DISKUS 250-50 MCG/ACT AEPB INHALE 1 PUFF TWICE DAILY 07/19/20   Collene Gobble, MD  aspirin 325 MG EC tablet Take 81 mg by mouth daily.    [provider]  bisoprolol (ZEBETA) 5 MG tablet Take 1 tablet (5 mg total) by mouth daily. 03/08/20   Minus Breeding, MD  Buprenorphine HCl (BELBUCA) 450 MCG FILM Place inside cheek.    [provider]  buPROPion [facility] SR) 150 MG 12 hr tablet  07/03/19   [provider]  diazepam (VALIUM) 5 MG tablet Take 5 mg by mouth 2 (two) times daily as needed for anxiety. 08/07/18   [provider]  fluticasone (FLONASE) 50 MCG/ACT nasal spray USE 2 SPRAYS NASALLY EVERY DAY 05/17/20   Collene Gobble, MD  furosemide (LASIX) 20 MG tablet Take 1 tablet (20 mg total) by mouth daily. 05/07/20 08/05/20  Lendon Colonel, NP  gabapentin (NEURONTIN) 300 MG capsule TAKE 1 CAPSULE BY MOUTH 3 TIMES DAILY 11/21/18   Jacelyn Pi, Lilia Argue, MD  Ipratropium-Albuterol (COMBIVENT RESPIMAT) 20-100 MCG/ACT AERS respimat INHALE 1 PUFF FOUR TIMES DAILY 08/10/20  Collene Gobble, MD  ipratropium-albuterol (DUONEB) 0.5-2.5 (3) MG/3ML SOLN INHALE THE CONTENTS OF 1 VIAL VIA NEBULIZER EVERY 6 HOURS AS NEEDED 05/17/20   Collene Gobble, MD  JANUMET 50-500 MG tablet TAKE 1 TABLET BY MOUTH ONCE A DAY 07/31/18   Jacelyn Pi, Lilia Argue, MD  [facility] 4 MG/0.1ML LIQD nasal spray kit Place 1 spray into the nose daily as needed (For overdose).  06/13/18   [provider]  nystatin (MYCOSTATIN) 100000 UNIT/ML suspension Take 5 mLs (500,000 Units total) by mouth 4 (four) times daily. 03/08/20   Minus Breeding, MD  omeprazole (PRILOSEC) 40 MG capsule TAKE 1 CAPSULE BY MOUTH EVERY DAY 01/08/19   Jacelyn Pi, Lilia Argue, MD  oxyCODONE-acetaminophen (PERCOCET) 10-325 MG tablet Take 1 tablet by mouth every 4 (four) hours. 05/28/18   Oretha Milch D, MD  rosuvastatin (CRESTOR) 10 MG tablet Take 1 tablet (10 mg total) by mouth daily. 05/07/20    Lendon Colonel, NP  sacubitril-valsartan (ENTRESTO) 49-51 MG Take 1 tablet by mouth 2 (two) times daily. 03/08/20   Minus Breeding, MD  VENTOLIN HFA 108 (90 Base) MCG/ACT inhaler INHALE 1 TO 2 PUFFS INTO THE LUNGS EVERY 6 HOURS AS NEEDED FOR WHEEZING OR SHORTNESS OF BREATH 02/16/20   Byrum, Rose Fillers, MD  Vitamin D, Ergocalciferol, (DRISDOL) 1.25 MG (50000 UT) CAPS capsule TAKE ONE CAPSULE BY MOUTH ONCE WEEKLY 12/18/18   Jacelyn Pi, Lilia Argue, MD    Family History    Family History  Problem Relation Age of Onset   Emphysema Mother    COPD Mother    Asthma Brother    Diabetes Brother    Hyperlipidemia Brother    Hypertension Brother    Asthma Sister    Hyperlipidemia Sister    Hypertension Sister    Mental illness Sister    Heart disease Maternal Uncle    Hypertension Brother    He indicated that his mother is alive. He indicated that his father is deceased. He indicated that the status of his sister is unknown. He indicated that the status of his maternal uncle is unknown.   Social History    Social History   Socioeconomic History   Marital status: Married    Spouse name: Not on file   Number of children: 4   Years of education: Not on file   Highest education level: Not on file  Occupational History   Occupation: DISABLED  Tobacco Use   Smoking status: Former    Packs/day: 0.75    Years: 47.00    Pack years: 35.25    Types: Cigarettes    Start date: 01/23/1973    Quit date: 12/03/2019    Years since quitting: 1.2   Smokeless tobacco: Never   Tobacco comments:    Smokes about 6 cigarettes a day now after his sister passed away, did stop smoking for almost a year started back November 2022  Vaping Use   Vaping Use: Never used  Substance and Sexual Activity   Alcohol use: Yes    Comment: Beer occasionally   Drug use: No   Sexual activity: Not Currently  Other Topics Concern   Not on file  Social History Narrative   Not on file   Social Determinants of  Health   Financial Resource Strain: Not on file  Food Insecurity: Not on file  Transportation Needs: Not on file  Physical Activity: Not on file  Stress: Not on file  Social Connections: Not on file  Intimate Partner Violence: Not on file     Review of Systems    General:  No chills, fever, night sweats or weight changes.  Cardiovascular:  No chest pain, dyspnea on exertion, edema, orthopnea, palpitations, paroxysmal nocturnal dyspnea. Dermatological: No rash, lesions/masses Respiratory: No cough, dyspnea Urologic: No hematuria, dysuria Abdominal:   No nausea, vomiting, diarrhea, bright red blood per rectum, melena, or hematemesis Neurologic:  No visual changes, wkns, changes in mental status. All other systems reviewed and are otherwise negative except as noted above.  Physical Exam    VS:  BP 122/70    Pulse 71    Ht _0  (1.727 m)    Wt 180 lb 6.4 oz (81.8 kg)    SpO2 (!) 84% Comment: Did not wear his oxygen today   BMI 27.43 kg/m  , BMI Body mass index is 27.43 kg/m. GEN: Well nourished, well developed, in no acute distress. HEENT: normal. Neck: Supple, no JVD, carotid bruits, or masses. Cardiac: RRR, no murmurs, rubs, or gallops. No clubbing, cyanosis, edema.  Radials/DP/PT 2+ and equal bilaterally.  Respiratory:  Respirations regular and unlabored, clear to auscultation bilaterally. GI: Soft, nontender, nondistended, BS + x 4. MS: no deformity or atrophy. Skin: warm and dry, no rash. Neuro:  Strength and sensation are intact. Psych: Normal affect.  Accessory Clinical Findings    Recent Labs: 05/07/2020: BUN 14; Creatinine, Ser 1.00; Potassium 4.9; Sodium 142   Recent Lipid Panel    Component Value Date/Time   CHOL 161 03/01/2018 0958   TRIG 140 05/26/2018 1209   HDL 40 03/01/2018 0958   CHOLHDL 4.0 03/01/2018 0958   LDLCALC 89 03/01/2018 0958    ECG personally reviewed by me today sinus rhythm with first-degree AV block 71 bpm no acute  changes.    Echocardiogram 06/04/2020  IMPRESSIONS     1. Left ventricular ejection fraction, by estimation, is 60 to 65%. The  left ventricle has normal function. The left ventricle has no regional  wall motion abnormalities. Left ventricular diastolic parameters are  consistent with Grade I diastolic  dysfunction (impaired relaxation).   2. Right ventricular systolic function is normal. The right ventricular  size is normal. There is normal pulmonary artery systolic pressure.   3. The mitral valve is normal in structure. Trivial mitral valve  regurgitation. No evidence of mitral stenosis.   4. The aortic valve is tricuspid. Aortic valve regurgitation is not  visualized. No aortic stenosis is present.   5. The inferior vena cava is normal in size with greater than 50%  respiratory variability, suggesting right atrial pressure of 3 mmHg.   Assessment & Plan   1.  Chronic systolic CHF-euvolemic today.  NYHA class II today.   Echocardiogram showed recovered EF 60-65%.  Continues to be physically active walking daily. Continue Entresto,  Decrease to every other day furosemide Heart healthy low-sodium diet-salty 6 given Maintain physical activity as tolerated  Essential hypertension-BP today 122/70.  Well-controlled at home. Continue Entresto, bisoprolol Heart healthy low-sodium diet-salty 6 given Maintain physical activity as tolerated  Hyperlipidemia-LDL 89 03/01/18 Continue rosuvastatin, aspirin Heart healthy low-sodium high-fiber diet-high-fiber options reviewed. Maintain physical activity as tolerated Fasting Lipids and lfts Aspirin given  COPD-oxygen dependent 3 L.  Stable.  Continues to feel better with exercise.    Lung CT 10/15/2020 showed lung nodules that were felt to be benign.  Follows with pulmonology  Tobacco abuse- Continues to smoke. Discuss cessation and importance. Cessation stressed   Disposition: Follow-up with  Dr. Percival Spanish or me in 6-9 months.   Jossie Ng. Marlicia Sroka NP-C    03/10/2021, 10:16 AM [address] Office [phone] Fax [phone]  Notice: This dictation was prepared with Dragon dictation along with smaller phrase technology. Any transcriptional errors that result from this process are unintentional and may not be corrected upon review.  I spent 14 minutes examining this patient, reviewing medications, and using patient centered shared decision making involving her cardiac care.  Prior to her visit I spent greater than 20 minutes reviewing her past medical history,  medications, and prior cardiac tests.

## 2021-03-10 ENCOUNTER — Other Ambulatory Visit: Payer: Self-pay | Admitting: Emergency Medicine

## 2021-03-10 ENCOUNTER — Other Ambulatory Visit: Payer: Self-pay

## 2021-03-10 ENCOUNTER — Encounter: Payer: Self-pay | Admitting: General Practice

## 2021-03-10 ENCOUNTER — Ambulatory Visit (INDEPENDENT_AMBULATORY_CARE_PROVIDER_SITE_OTHER): Payer: Medicare HMO | Admitting: General Practice

## 2021-03-10 VITALS — BP 122/70 | HR 71 | Ht 68.0 in | Wt 180.4 lb

## 2021-03-10 DIAGNOSIS — E7800 Pure hypercholesterolemia, unspecified: Secondary | ICD-10-CM

## 2021-03-10 DIAGNOSIS — Z79899 Other long term (current) drug therapy: Secondary | ICD-10-CM

## 2021-03-10 DIAGNOSIS — I5022 Chronic systolic (congestive) heart failure: Secondary | ICD-10-CM | POA: Diagnosis not present

## 2021-03-10 DIAGNOSIS — J441 Chronic obstructive pulmonary disease with (acute) exacerbation: Secondary | ICD-10-CM | POA: Diagnosis not present

## 2021-03-10 DIAGNOSIS — F17200 Nicotine dependence, unspecified, uncomplicated: Secondary | ICD-10-CM

## 2021-03-10 DIAGNOSIS — I10 Essential (primary) hypertension: Secondary | ICD-10-CM | POA: Diagnosis not present

## 2021-03-10 LAB — LIPID PANEL
Chol/HDL Ratio: 3 ratio (ref 0.0–5.0)
Cholesterol, Total: 119 mg/dL (ref 100–199)
HDL: 40 mg/dL (ref >39)
LDL Chol Calc (NIH): 64 mg/dL (ref 0–99)
Triglycerides: 75 mg/dL (ref 0–149)
VLDL Cholesterol Cal: 15 mg/dL (ref 5–40)

## 2021-03-10 LAB — HEPATIC FUNCTION PANEL
ALT: 8 IU/L (ref 0–44)
AST: 15 IU/L (ref 0–40)
Albumin: 4.3 g/dL (ref 3.8–4.9)
Alkaline Phosphatase: 78 IU/L (ref 44–121)
Bilirubin Total: 0.5 mg/dL (ref 0.0–1.2)
Bilirubin, Direct: 0.17 mg/dL (ref 0.00–0.40)
Total Protein: 6.7 g/dL (ref 6.0–8.5)

## 2021-03-10 MED ORDER — FUROSEMIDE 20 MG PO TABS: 20.0000 mg | ORAL_TABLET | ORAL | 3 refills | Status: DC

## 2021-03-10 NOTE — Patient Instructions (Signed)
Medication Instructions:  DECREASE FUROSEMIDE TAKE EVERY-OTHER-DAY  *If you need a refill on your cardiac medications before your next appointment, please call your pharmacy*  Lab Work:   Testing/Procedures:  LIPID,LFT TODAY  NONE  Special Instructions PLEASE READ AND CESSATION TIPS-ATTACHED HANDOUT GIVEN  Follow-Up: Your next appointment:  6-9 month(s) In Person with Rollene Rotunda, MD   Please call our office 2 months in advance to schedule this appointment  :1  At [facility], you and your health needs are our priority.  As part of our continuing mission to provide you with exceptional heart care, we have created designated Provider Care Teams.  These Care Teams include your primary Cardiologist (physician) and Advanced Practice Providers (APPs -  Physician Assistants and Nurse Practitioners) who all work together to provide you with the care you need, when you need it.            6 SALTY THINGS TO AVOID     1,800MG  DAILY      Steps to Quit Smoking Smoking tobacco is the leading cause of preventable death. It can affect almost every organ in the body. Smoking puts you and people around you at risk for many serious, long-lasting (chronic) diseases. Quitting smoking can be hard, but it is one of the best things that you can do for your health. It is never too late to quit. How do I get ready to quit? When you decide to quit smoking, make a plan to help you succeed. Before you quit: Pick a date to quit. Set a date within the next 2 weeks to give you time to prepare. Write down the reasons why you are quitting. Keep this list in places where you will see it often. Tell your family, friends, and co-workers that you are quitting. Their support is important. Talk with your doctor about the choices that may help you quit. Find out if your health insurance will pay for these treatments. Know the people, places, things, and activities that make you want to smoke (triggers). Avoid  them. What first steps can I take to quit smoking? Throw away all cigarettes at home, at work, and in your car. Throw away the things that you use when you smoke, such as ashtrays and lighters. Clean your car. Make sure to empty the ashtray. Clean your home, including curtains and carpets. What can I do to help me quit smoking? Talk with your doctor about taking medicines and seeing a counselor at the same time. You are more likely to succeed when you do both. If you are pregnant or breastfeeding, talk with your doctor about counseling or other ways to quit smoking. Do not take medicine to help you quit smoking unless your doctor tells you to do so. To quit smoking: Quit right away Quit smoking totally, instead of slowly cutting back on how much you smoke over a period of time. Go to counseling. You are more likely to quit if you go to counseling sessions regularly. Take medicine You may take medicines to help you quit. Some medicines need a prescription, and some you can buy over-the-counter. Some medicines may contain a drug called nicotine to replace the nicotine in cigarettes. Medicines may: Help you to stop having the desire to smoke (cravings). Help to stop the problems that come when you stop smoking (withdrawal symptoms). Your doctor may ask you to use: Nicotine patches, gum, or lozenges. Nicotine inhalers or sprays. Non-nicotine medicine that is taken by mouth. Find resources Find resources  and other ways to help you quit smoking and remain smoke-free after you quit. These resources are most helpful when you use them often. They include: Online chats with a Veterinary surgeon. Phone quitlines. Printed Materials engineer. Support groups or group counseling. Text messaging programs. Mobile phone apps. Use apps on your mobile phone or tablet that can help you stick to your quit plan. There are many free apps for mobile phones and tablets as well as websites. Examples include Quit Guide from  the [employer] and smokefree.gov  What things can I do to make it easier to quit?  Talk to your family and friends. Ask them to support and encourage you. Call a phone quitline (1-800-QUIT-NOW), reach out to support groups, or work with a Veterinary surgeon. Ask people who smoke to not smoke around you. Avoid places that make you want to smoke, such as: Bars. Parties. Smoke-break areas at work. Spend time with people who do not smoke. Lower the stress in your life. Stress can make you want to smoke. Try these things to help your stress: Getting regular exercise. Doing deep-breathing exercises. Doing yoga. Meditating. Doing a body scan. To do this, close your eyes, focus on one area of your body at a time from head to toe. Notice which parts of your body are tense. Try to relax the muscles in those areas. How will I feel when I quit smoking? Day 1 to 3 weeks Within the first 24 hours, you may start to have some problems that come from quitting tobacco. These problems are very bad 2-3 days after you quit, but they do not often last for more than 2-3 weeks. You may get these symptoms: Mood swings. Feeling restless, nervous, angry, or annoyed. Trouble concentrating. Dizziness. Strong desire for high-sugar foods and nicotine. Weight gain. Trouble pooping (constipation). Feeling like you may vomit (nausea). Coughing or a sore throat. Changes in how the medicines that you take for other issues work in your body. Depression. Trouble sleeping (insomnia). Week 3 and afterward After the first 2-3 weeks of quitting, you may start to notice more positive results, such as: Better sense of smell and taste. Less coughing and sore throat. Slower heart rate. Lower blood pressure. Clearer skin. Better breathing. Fewer sick days. Quitting smoking can be hard. Do not give up if you fail the first time. Some people need to try a few times before they succeed. Do your best to stick to your quit plan, and talk  with your doctor if you have any questions or concerns. Summary Smoking tobacco is the leading cause of preventable death. Quitting smoking can be hard, but it is one of the best things that you can do for your health. When you decide to quit smoking, make a plan to help you succeed. Quit smoking right away, not slowly over a period of time. When you start quitting, seek help from your doctor, family, or friends. This information is not intended to replace advice given to you by your health care provider. Make sure you discuss any questions you have with your health care provider. Document Revised: 09/17/2020 Document Reviewed: 03/30/2018 Elsevier Patient Education  2022 [employer].

## 2021-04-01 ENCOUNTER — Encounter: Payer: Self-pay | Admitting: Emergency Medicine

## 2021-04-01 ENCOUNTER — Other Ambulatory Visit: Payer: Self-pay

## 2021-04-01 ENCOUNTER — Ambulatory Visit (INDEPENDENT_AMBULATORY_CARE_PROVIDER_SITE_OTHER): Payer: Medicare HMO | Admitting: Emergency Medicine

## 2021-04-01 DIAGNOSIS — J439 Emphysema, unspecified: Secondary | ICD-10-CM | POA: Diagnosis not present

## 2021-04-01 DIAGNOSIS — F17200 Nicotine dependence, unspecified, uncomplicated: Secondary | ICD-10-CM

## 2021-04-01 DIAGNOSIS — J9611 Chronic respiratory failure with hypoxia: Secondary | ICD-10-CM

## 2021-04-01 DIAGNOSIS — K219 Gastro-esophageal reflux disease without esophagitis: Secondary | ICD-10-CM

## 2021-04-01 DIAGNOSIS — J301 Allergic rhinitis due to pollen: Secondary | ICD-10-CM | POA: Diagnosis not present

## 2021-04-01 DIAGNOSIS — J449 Chronic obstructive pulmonary disease, unspecified: Secondary | ICD-10-CM

## 2021-04-01 MED ORDER — ALBUTEROL SULFATE HFA 108 (90 BASE) MCG/ACT IN AERS: INHALATION_SPRAY | RESPIRATORY_TRACT | 1 refills | Status: DC

## 2021-04-01 NOTE — Assessment & Plan Note (Addendum)
Persistent symptoms, uses redundant scheduled bronchodilators and frequent albuterol.  Discussed smoking cessation with him.  Plan to continue Advair twice daily, scheduled DuoNeb/Combivent 4 times a day (uses 1 or the other depending on whether he is at home).  Continue albuterol.  He uses several times a day, uses name brand Ventolin which works for him the best.  Question whether we may be able to consolidate going forward, consider Stiolto or Breztri ?

## 2021-04-01 NOTE — Progress Notes (Signed)
?  Subjective:  ? ? Patient ID: Scott [name], male    DOB: [birth year], 61 y.o.   MRN: [id number] ? ?HPI ? ?ROV 08/03/20 --Scott [name] is 38, history of tobacco use with severe obstructive lung disease.  Also with GERD, allergic rhinitis and chronic cough.  Current regimen includes scheduled Advair, scheduled Combivent/DuoNeb qid.  He has albuterol which he uses approximately 3-4x a day. He has not been smoking for almost 1 year. No flares since last time.  ?Activity level is pretty good with O2 on. He is interested in possibly getting sildenifil for ED, wants pulmonary and cards input regarding this. He has daily cough, clear mucous. Minimal wheeze.  ?He was told that his ventolin was not approved by his insurance or was not filled?? Need to see if this is on his [facility] formulary. He prefers it to alternative albuterol HFA ?He has documented desaturations with exertion, is now reliably on 4 liters per minute via POC that he obtained from [employer] ?COVID vaccine up to date.  ? ?He participates in lung cancer screening program, is due for routine follow-up CT this month. ? ?ROV 04/01/2021 --61 year old gentleman with a history of severe obstructive lung disease, continues to smoke.  He also has a history of allergic rhinitis, GERD and chronic cough.  Hypoxemic respiratory failure on 4 L/min via POC.  He is currently managed on Advair, scheduled DuoNeb/combivent, albuterol as needed > 4-6x a day ?On flonase, omeprazole  ? ?LDCT 09/14/20 reviewed by me shows several new pulmonary nodules.  RADS 3, recommended 60-month follow-up ? ? ?  ?No flowsheet data found. ? ?Objective:  ? Physical Exam  ?Vitals:  ? 04/01/21 1129  ?BP: 138/74  ?Pulse: 75  ?Temp: 97.9 ?F (36.6 ?C)  ?TempSrc: Oral  ?SpO2: 91%  ?Weight: 189 lb 6.4 oz (85.9 kg)  ?Height: 5\' 8"  (1.727 m)  ? ? ?Gen: Pleasant, well-nourished, in no distress ? ?ENT: No lesions,  mouth clear,  oropharynx clear, no postnasal drip,  ? ?Neck: No JVD, no stridor ? ?Lungs: mostly clear,  some inspiratory wheeze, bilateral end expiratory wheeze ? ?Cardiovascular: RRR, heart sounds normal, no murmur or gallops, trace peripheral edema ? ?Musculoskeletal: No deformities, no cyanosis or clubbing ? ?Neuro: alert, appropriate, non-focal ? ?Skin: Warm ? ?   ?Assessment & Plan:  ?COPD (chronic obstructive pulmonary disease) ?Persistent symptoms, uses redundant scheduled bronchodilators and frequent albuterol.  Discussed smoking cessation with him.  Plan to continue Advair twice daily, scheduled DuoNeb/Combivent 4 times a day (uses 1 or the other depending on whether he is at home).  Continue albuterol.  He uses several times a day, uses name brand Ventolin which works for him the best.  Question whether we may be able to consolidate going forward, consider Stiolto or Breztri ? ?Chronic respiratory failure with hypoxia (HCC) ?Continue current supplemental oxygen 4 L/min ? ?Allergic rhinitis ?Continue fluticasone nasal spray ? ?GERD (gastroesophageal reflux disease) ?Continue omeprazole ? ?Tobacco use disorder ?Discussed the importance of cessation with him today. ? ? ? ? , MD, PhD ?04/01/2021, 12:20 PM ?[facility] ?[phone] or if no answer [phone] ? ?

## 2021-04-01 NOTE — Addendum Note (Signed)
Addended by: Dorisann Frames R on: 04/01/2021 02:10 PM ? ? Modules accepted: Orders ? ?

## 2021-04-01 NOTE — Assessment & Plan Note (Signed)
Continue fluticasone nasal spray.

## 2021-04-01 NOTE — Assessment & Plan Note (Signed)
Continue omeprazole 

## 2021-04-01 NOTE — Patient Instructions (Signed)
Please continue your inhaled medications as you have been using them. ?Continue fluticasone nasal spray ?Continue omeprazole once daily. ?Get your lung cancer screening CT scan later this month as planned.  We will review next time. ?Continue oxygen at 4 L/min ?We talked about the importance of stopping smoking today. ?Follow with Dr. Delton Coombes in about 1 month (we will try to coordinate with your wife's appointment) ? ?

## 2021-04-01 NOTE — Assessment & Plan Note (Signed)
Continue current supplemental oxygen 4 L/min ?

## 2021-04-01 NOTE — Assessment & Plan Note (Signed)
Discussed the importance of cessation with him today. °

## 2021-04-08 ENCOUNTER — Ambulatory Visit
Admission: RE | Admit: 2021-04-08 | Discharge: 2021-04-08 | Disposition: A | Discharge status: Home / Self Care | Payer: Medicare HMO | Source: Ambulatory Visit | Attending: Acute Care | Admitting: Acute Care

## 2021-04-08 ENCOUNTER — Other Ambulatory Visit: Payer: Self-pay

## 2021-04-08 DIAGNOSIS — R911 Solitary pulmonary nodule: Secondary | ICD-10-CM

## 2021-04-08 DIAGNOSIS — Z87891 Personal history of nicotine dependence: Secondary | ICD-10-CM

## 2021-04-11 ENCOUNTER — Other Ambulatory Visit: Payer: Self-pay | Admitting: Acute Care

## 2021-04-11 DIAGNOSIS — Z87891 Personal history of nicotine dependence: Secondary | ICD-10-CM

## 2021-04-25 ENCOUNTER — Encounter (HOSPITAL_COMMUNITY): Payer: Self-pay

## 2021-04-25 ENCOUNTER — Emergency Department (HOSPITAL_COMMUNITY): Payer: Medicare HMO

## 2021-04-25 ENCOUNTER — Emergency Department (HOSPITAL_COMMUNITY)
Admission: EM | Admit: 2021-04-25 | Discharge: 2021-04-25 | Disposition: A | Discharge status: Home / Self Care | Payer: Medicare HMO | Attending: Emergency Medicine | Admitting: Emergency Medicine

## 2021-04-25 ENCOUNTER — Other Ambulatory Visit: Payer: Self-pay

## 2021-04-25 DIAGNOSIS — J449 Chronic obstructive pulmonary disease, unspecified: Secondary | ICD-10-CM | POA: Diagnosis not present

## 2021-04-25 DIAGNOSIS — Z7982 Long term (current) use of aspirin: Secondary | ICD-10-CM | POA: Diagnosis not present

## 2021-04-25 DIAGNOSIS — Z7951 Long term (current) use of inhaled steroids: Secondary | ICD-10-CM | POA: Diagnosis not present

## 2021-04-25 DIAGNOSIS — R0602 Shortness of breath: Secondary | ICD-10-CM | POA: Diagnosis not present

## 2021-04-25 DIAGNOSIS — I509 Heart failure, unspecified: Secondary | ICD-10-CM | POA: Insufficient documentation

## 2021-04-25 DIAGNOSIS — R0789 Other chest pain: Secondary | ICD-10-CM | POA: Insufficient documentation

## 2021-04-25 DIAGNOSIS — F17210 Nicotine dependence, cigarettes, uncomplicated: Secondary | ICD-10-CM | POA: Insufficient documentation

## 2021-04-25 LAB — BASIC METABOLIC PANEL
Anion gap: 7 (ref 5–15)
BUN: 16 mg/dL (ref 6–20)
CO2: 28 mmol/L (ref 22–32)
Calcium: 9.2 mg/dL (ref 8.9–10.3)
Chloride: 105 mmol/L (ref 98–111)
Creatinine, Ser: 0.93 mg/dL (ref 0.61–1.24)
GFR, Estimated: >60 mL/min (ref >60)
Glucose, Bld: 92 mg/dL (ref 70–99)
Potassium: 4.1 mmol/L (ref 3.5–5.1)
Sodium: 140 mmol/L (ref 135–145)

## 2021-04-25 LAB — CBC
HCT: 46.2 % (ref 39.0–52.0)
Hemoglobin: 14.8 g/dL (ref 13.0–17.0)
MCH: 28.3 pg (ref 26.0–34.0)
MCHC: 32 g/dL (ref 30.0–36.0)
MCV: 88.3 fL (ref 80.0–100.0)
Platelets: 208 10*3/uL (ref 150–400)
RBC: 5.23 MIL/uL (ref 4.22–5.81)
RDW: 15.2 % (ref 11.5–15.5)
WBC: 7 10*3/uL (ref 4.0–10.5)
nRBC: 0 % (ref 0.0–0.2)

## 2021-04-25 LAB — TROPONIN I (HIGH SENSITIVITY): Troponin I (High Sensitivity): 3 ng/L (ref <18)

## 2021-04-25 NOTE — Discharge Instructions (Addendum)
Return for any problem.  ? ?Stop Smoking. ?

## 2021-04-25 NOTE — ED Triage Notes (Signed)
Pt reports with chest pressure that wraps around to his back and SHOB x 3 days. Pt is on 4 L of oxygen at home per baseline.  ?

## 2021-04-25 NOTE — ED Notes (Signed)
Pt currently in xray

## 2021-04-25 NOTE — ED Provider Notes (Signed)
?[facility] DEPT ?Provider Note ? ? ?CSN: [id number] ?Arrival date & time: 04/25/21  2026 ? ?  ? ?History ? ?Chief Complaint  ?Patient presents with  ? Chest Pain  ? Shortness of Breath  ? ? ?Scott [name] is a 61 y.o. male. ? ?61 year old male with prior medical history as detailed below presents for evaluation.  Patient reports longstanding history of COPD.  He reports that he is "supposed to" use 3 to 4 L nasal cannula O2 at all times.  Patient reports that he has started smoking cigarettes again over the last several months.  Patient reports feeling "wheezy and tight" in his lungs over the last 2 to 3 days. ? ?The history is provided by the patient.  ?Chest Pain ?Pain location:  Unable to specify ?Pain quality: aching and pressure   ?Pain radiates to:  Does not radiate ?Pain severity:  No pain ?Onset quality:  Gradual ?Duration:  3 days ?Timing:  Rare ?Progression:  Waxing and waning ?Chronicity:  New ?Associated symptoms: shortness of breath   ?Shortness of Breath ?Associated symptoms: chest pain   ? ?  ? ?Home Medications ?Prior to Admission medications   ?Medication Sig Start Date End Date Taking? Authorizing Provider  ?ADVAIR DISKUS 250-50 MCG/ACT AEPB INHALE 1 PUFF TWICE DAILY 03/11/21   Collene Gobble, MD  ?albuterol (VENTOLIN HFA) 108 (90 Base) MCG/ACT inhaler INHALE 1 TO 2 PUFFS EVERY 6 HOURS AS NEEDED FOR WHEEZING OR SHORTNESS OF BREATH 04/01/21   Collene Gobble, MD  ?aspirin 325 MG EC tablet Take 81 mg by mouth daily.    [provider]  ?bisoprolol (ZEBETA) 5 MG tablet TAKE 1 TABLET EVERY DAY 01/04/21   Minus Breeding, MD  ?buprenorphine-naloxone (SUBOXONE) 8-2 mg SUBL SL tablet Place 1 tablet under the tongue daily.    [provider]  ?buPROPion [facility] SR) 150 MG 12 hr tablet  07/03/19   [provider]  ?diazepam (VALIUM) 5 MG tablet Take 5 mg by mouth 2 (two) times daily as needed for anxiety. 08/07/18   [provider]   ?ENTRESTO 49-51 MG TAKE 1 TABLET TWICE DAILY 01/04/21   Minus Breeding, MD  ?fluticasone [facility]) 50 MCG/ACT nasal spray USE 2 SPRAYS NASALLY EVERY DAY 05/17/20   Collene Gobble, MD  ?furosemide (LASIX) 20 MG tablet Take 1 tablet (20 mg total) by mouth every other day. 03/10/21   Deberah Pelton, NP  ?gabapentin (NEURONTIN) 300 MG capsule TAKE 1 CAPSULE BY MOUTH 3 TIMES DAILY 11/21/18   Jacelyn Pi, Lilia Argue, MD  ?Ipratropium-Albuterol (COMBIVENT RESPIMAT) 20-100 MCG/ACT AERS respimat INHALE 1 PUFF FOUR TIMES DAILY 08/10/20   Collene Gobble, MD  ?ipratropium-albuterol (DUONEB) 0.5-2.5 (3) MG/3ML SOLN INHALE THE CONTENTS OF 1 VIAL VIA NEBULIZER EVERY 6 HOURS AS NEEDED 05/17/20   Collene Gobble, MD  ?JANUMET 50-500 MG tablet TAKE 1 TABLET BY MOUTH ONCE A DAY 07/31/18   Jacelyn Pi, Lilia Argue, MD  ?[facility] 4 MG/0.1ML LIQD nasal spray kit Place 1 spray into the nose daily as needed (For overdose).  06/13/18   [provider]  ?nystatin (MYCOSTATIN) 100000 UNIT/ML suspension Take 5 mLs (500,000 Units total) by mouth 4 (four) times daily. 03/08/20   Minus Breeding, MD  ?omeprazole (PRILOSEC) 40 MG capsule TAKE 1 CAPSULE BY MOUTH EVERY DAY 01/08/19   Jacelyn Pi, Lilia Argue, MD  ?oxyCODONE-acetaminophen (PERCOCET) 10-325 MG tablet Take 1 tablet by mouth every 4 (four) hours. 05/28/18  Oretha Milch D, MD  ?rosuvastatin (CRESTOR) 10 MG tablet Take 1 tablet (10 mg total) by mouth daily. 05/07/20   Lendon Colonel, NP  ?Vitamin D, Ergocalciferol, (DRISDOL) 1.25 MG (50000 UT) CAPS capsule TAKE ONE CAPSULE BY MOUTH ONCE WEEKLY 12/18/18   Jacelyn Pi, Lilia Argue, MD  ?   ? ?Allergies    ?Penicillins and Cymbalta [duloxetine hcl]   ? ?Review of Systems   ?Review of Systems  ?Respiratory:  Positive for shortness of breath.   ?Cardiovascular:  Positive for chest pain.  ?All other systems reviewed and are negative. ? ?Physical Exam ?Updated Vital Signs ?BP (!) 124/98   Pulse 71   Temp 97.8 ?F (36.6 ?C) (Oral)   Resp 14    Ht '5\' 8"'  (1.727 m)   Wt 84.4 kg   SpO2 93%   BMI 28.28 kg/m?  ?Physical Exam ?Vitals and nursing note reviewed.  ?Constitutional:   ?   General: He is not in acute distress. ?   Appearance: Normal appearance. He is well-developed.  ?HENT:  ?   Head: Normocephalic and atraumatic.  ?Eyes:  ?   Conjunctiva/sclera: Conjunctivae normal.  ?   Pupils: Pupils are equal, round, and reactive to light.  ?Cardiovascular:  ?   Rate and Rhythm: Normal rate and regular rhythm.  ?   Heart sounds: Normal heart sounds.  ?Pulmonary:  ?   Effort: Pulmonary effort is normal. No respiratory distress.  ?   Breath sounds: Normal breath sounds.  ?Abdominal:  ?   General: There is no distension.  ?   Palpations: Abdomen is soft.  ?   Tenderness: There is no abdominal tenderness.  ?Musculoskeletal:     ?   General: No deformity. Normal range of motion.  ?   Cervical back: Normal range of motion and neck supple.  ?Skin: ?   General: Skin is warm and dry.  ?Neurological:  ?   General: No focal deficit present.  ?   Mental Status: He is alert and oriented to person, place, and time.  ? ? ?ED Results / Procedures / Treatments   ?Labs ?(all labs ordered are listed, but only abnormal results are displayed) ?Labs Reviewed  ?CBC  ?BASIC METABOLIC PANEL  ?TROPONIN I (HIGH SENSITIVITY)  ? ? ?EKG ?EKG Interpretation ? ?Date/Time:  Monday April 25 2021 20:40:51 EDT ?Ventricular Rate:  72 ?PR Interval:  223 ?QRS Duration: 97 ?QT Interval:  394 ?QTC Calculation: 432 ?R Axis:   -40 ?Text Interpretation: Sinus rhythm Prolonged PR interval Left axis deviation Borderline T abnormalities, lateral leads Minimal ST elevation, anterior leads Confirmed by Dene Gentry [phone]) on 04/25/2021 9:08:57 PM ? ?Radiology ?DG Chest 2 View ? ?Result Date: 04/25/2021 ?CLINICAL DATA:  Chest pain and heart palpitations with shortness of breath. EXAM: CHEST - 2 VIEW COMPARISON:  Chest CT 04/08/2021 for lung cancer screening. FINDINGS: Heart size and vasculature are normal aside  from calcification of the transverse aorta. No pleural effusion is seen. The lungs emphysematous but clear. 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 IMPRESSION: No active cardiopulmonary disease.  Stable COPD chest. Electronically Signed   By: Telford Nab M.D.   On: 04/25/2021 20:55   ? ?Procedures ?Procedures  ? ? ?Medications Ordered in ED ?Medications - No data to display ? ?ED Course/ Medical Decision Making/ A&P ?  ?                        ?  Medical Decision Making ?Amount and/or Complexity of Data Reviewed ?Labs: ordered. ?Radiology: ordered. ? ? ? ?Medical Screen Complete ? ?This patient presented to the ED with complaint of chest discomfort. ? ?This complaint involves an extensive number of treatment options. The initial differential diagnosis includes, but is not limited to, ACS, angina, pulmonary pathology, metabolic abnormality, etc. ? ?This presentation is: Acute, Chronic, Self-Limited, Previously Undiagnosed, Uncertain Prognosis, Complicated, Systemic Symptoms, and Threat to Life/Bodily Function ? ?Patient with longstanding history of COPD, CHF, and on baseline Supplemental O2 presents with complaint of chest discomfort.  He denies associated shortness of breath. ? ?Patient does admit to heavy use of cigarettes. ? ?Screening labs obtained are without significant abnormality. ? ?Troponin is 3.  EKG is without acute ischemic changes.  Patient with highly atypical symptoms present constantly for more than 48 hours. ? ?X-ray is without evidence of acute pathology ? ?Patient is reassured by his work-up.  He now desires DC home.  Importance of close follow-up is stressed.  Strict return precautions given and understood. ? ? ? ?Co morbidities that complicated the patient's evaluation ? ?COPD/CHF/Chronic O2 requirement ? ? ?Additional history obtained: ? ?External records from outside sources obtained and reviewed including prior ED visits and  prior Inpatient records.  ? ? ?Lab Tests: ? ?I ordered and personally interpreted labs.  The pertinent results include:  CBC BMP Trop ? ? ?Imaging Studies ordered: ? ?I ordered imaging studies including CXR  ?I inde

## 2021-05-23 ENCOUNTER — Other Ambulatory Visit: Payer: Self-pay | Admitting: Emergency Medicine

## 2021-05-24 ENCOUNTER — Ambulatory Visit (INDEPENDENT_AMBULATORY_CARE_PROVIDER_SITE_OTHER): Payer: Medicare HMO | Admitting: Emergency Medicine

## 2021-05-24 ENCOUNTER — Encounter: Payer: Self-pay | Admitting: Emergency Medicine

## 2021-05-24 DIAGNOSIS — J9611 Chronic respiratory failure with hypoxia: Secondary | ICD-10-CM

## 2021-05-24 DIAGNOSIS — J439 Emphysema, unspecified: Secondary | ICD-10-CM | POA: Diagnosis not present

## 2021-05-24 DIAGNOSIS — J301 Allergic rhinitis due to pollen: Secondary | ICD-10-CM | POA: Diagnosis not present

## 2021-05-24 DIAGNOSIS — K219 Gastro-esophageal reflux disease without esophagitis: Secondary | ICD-10-CM | POA: Diagnosis not present

## 2021-05-24 DIAGNOSIS — F17200 Nicotine dependence, unspecified, uncomplicated: Secondary | ICD-10-CM

## 2021-05-24 DIAGNOSIS — J449 Chronic obstructive pulmonary disease, unspecified: Secondary | ICD-10-CM

## 2021-05-24 MED ORDER — VENTOLIN HFA 108 (90 BASE) MCG/ACT IN AERS: INHALATION_SPRAY | RESPIRATORY_TRACT | 1 refills | Status: DC

## 2021-05-24 MED ORDER — ADVAIR DISKUS 250-50 MCG/ACT IN AEPB: 1.0000 | INHALATION_SPRAY | Freq: Two times a day (BID) | RESPIRATORY_TRACT | 1 refills | Status: DC

## 2021-05-24 NOTE — Assessment & Plan Note (Signed)
27-month follow-up lung cancer screening CT was reassuring, stable.  He needs an annual follow-up in 03/2022.  Discussed cessation with him.  He is cut down to 2 cigarettes daily.  He is motivated stop completely.  Encouraged him in this. ?

## 2021-05-24 NOTE — Progress Notes (Signed)
?  Subjective:  ? ? Patient ID: Scott [name], male    DOB: 12/07/1960, 61 y.o.   MRN: [id number] ? ?HPI ? ?ROV 04/01/2021 --61 year old gentleman with a history of severe obstructive lung disease, continues to smoke.  He also has a history of allergic rhinitis, GERD and chronic cough.  Hypoxemic respiratory failure on 4 L/min via POC.  He is currently managed on Advair, scheduled DuoNeb/combivent, albuterol as needed > 4-6x a day ?On flonase, omeprazole  ? ?LDCT 09/14/20 reviewed by me shows several new pulmonary nodules.  RADS 3, recommended 73-month follow-up ? ? ?ROV 05/24/21 --follow-up visit 61 year old man with severe obstructive lung disease.  He continues to smoke.  He has associated hypoxemic respiratory failure on 4 L/min via POC.  Also with chronic cough in the setting of allergic rhinitis, GERD.  Currently managed on DuoNeb/Combivent 4 times daily, Advair twice daily, albuterol as needed.  He is on fluticasone nasal spray and omeprazole for his contributors to cough.  Today he reports that he is overall doing well. No real changes - he can walk, sometimes does so off his O2. Occasional wheeze and cough. He is using albuterol about 3-5x a day.  ?He is down to 2 cig a day.  ? ?Lung cancer screening CT 04/08/2021 reviewed by me showed centrilobular and bullous emphysematous change, stable pulmonary nodules, 4.7 mm or less.  RADS 2 study, recommended repeat in 1 year ? ?  ?   ? View : No data to display.  ?  ?  ?  ? ? ?Objective:  ? Physical Exam  ?Vitals:  ? 05/24/21 1127  ?BP: 120/76  ?Pulse: 73  ?Temp: 98.6 ?F (37 ?C)  ?TempSrc: Oral  ?SpO2: 93%  ?Weight: 191 lb 9.6 oz (86.9 kg)  ?Height: 5\' 8"  (1.727 m)  ? ? ?Gen: Pleasant, well-nourished, in no distress ? ?ENT: No lesions,  mouth clear,  oropharynx clear, no postnasal drip,  ? ?Neck: No JVD, no stridor ? ?Lungs: mostly clear, some inspiratory wheeze, bilateral end expiratory wheeze ? ?Cardiovascular: RRR, heart sounds normal, no murmur or gallops, trace  peripheral edema ? ?Musculoskeletal: No deformities, no cyanosis or clubbing ? ?Neuro: alert, appropriate, non-focal ? ?Skin: Warm ? ?   ?Assessment & Plan:  ?COPD (chronic obstructive pulmonary disease) ?Please continue your DuoNeb or Combivent 4 times a day on a schedule as you have been taking it. ?Keep albuterol available to use 2 puffs when needed for shortness of breath, chest tightness, wheezing.  We will refill this for you, name brand Ventolin. ?Follow with Dr in 6 months or sooner if you have any problems ? ?Chronic respiratory failure with hypoxia (HCC) ?Continue your oxygen at 4 L/min.  Try to use this reliably. ? ? ?Allergic rhinitis ?Continue your fluticasone nasal spray as you have been taking it. ? ?GERD (gastroesophageal reflux disease) ?Continue your omeprazole as you have been taking ? ?Tobacco use disorder ?49-month follow-up lung cancer screening CT was reassuring, stable.  He needs an annual follow-up in 03/2022.  Discussed cessation with him.  He is cut down to 2 cigarettes daily.  He is motivated stop completely.  Encouraged him in this. ? ? ? ? ?04/2022, MD, PhD ?05/24/2021, 12:01 PM ?[facility] ?[phone] or if no answer [phone] ? ?

## 2021-05-24 NOTE — Assessment & Plan Note (Signed)
Continue your omeprazole as you have been taking ?

## 2021-05-24 NOTE — Patient Instructions (Signed)
Please continue your DuoNeb or Combivent 4 times a day on a schedule as you have been taking it. ?Keep albuterol available to use 2 puffs when needed for shortness of breath, chest tightness, wheezing.  We will refill this for you, name brand Ventolin. ?Continue to work on decreasing your cigarettes ?Continue your oxygen at 4 L/min.  Try to use this reliably. ?We reviewed your CT scan of the chest today.  You need a repeat lung cancer screening CT in March 2024. ?Continue your fluticasone nasal spray as you have been taking it. ?Continue your omeprazole as you have been taking ?Follow with Dr Delton Coombes in 6 months or sooner if you have any problems ? ? ?

## 2021-05-24 NOTE — Assessment & Plan Note (Signed)
Continue your fluticasone nasal spray as you have been taking it 

## 2021-05-24 NOTE — Addendum Note (Signed)
Addended by: Dorisann Frames R on: 05/24/2021 12:14 PM ? ? Modules accepted: Orders ? ?

## 2021-05-24 NOTE — Assessment & Plan Note (Signed)
Continue your oxygen at 4 L/min.  Try to use this reliably. ? ?

## 2021-05-24 NOTE — Assessment & Plan Note (Signed)
Please continue your DuoNeb or Combivent 4 times a day on a schedule as you have been taking it. ?Keep albuterol available to use 2 puffs when needed for shortness of breath, chest tightness, wheezing.  We will refill this for you, name brand Ventolin. ?Follow with Dr Delton Coombes in 6 months or sooner if you have any problems ?

## 2021-06-06 ENCOUNTER — Emergency Department (HOSPITAL_COMMUNITY)
Admission: EM | Admit: 2021-06-06 | Discharge: 2021-06-06 | Disposition: A | Discharge status: Home / Self Care | Payer: Medicare HMO | Attending: Emergency Medicine | Admitting: Emergency Medicine

## 2021-06-06 ENCOUNTER — Encounter (HOSPITAL_COMMUNITY): Payer: Self-pay

## 2021-06-06 DIAGNOSIS — I509 Heart failure, unspecified: Secondary | ICD-10-CM | POA: Diagnosis not present

## 2021-06-06 DIAGNOSIS — I110 Hypertensive heart disease with heart failure: Secondary | ICD-10-CM | POA: Insufficient documentation

## 2021-06-06 DIAGNOSIS — Z79899 Other long term (current) drug therapy: Secondary | ICD-10-CM | POA: Diagnosis not present

## 2021-06-06 DIAGNOSIS — M79602 Pain in left arm: Secondary | ICD-10-CM

## 2021-06-06 DIAGNOSIS — Z7982 Long term (current) use of aspirin: Secondary | ICD-10-CM | POA: Insufficient documentation

## 2021-06-06 HISTORY — DX: Acute myocardial infarction, unspecified: I21.9

## 2021-06-06 LAB — CBC
HCT: 44.7 % (ref 39.0–52.0)
Hemoglobin: 14.7 g/dL (ref 13.0–17.0)
MCH: 29.2 pg (ref 26.0–34.0)
MCHC: 32.9 g/dL (ref 30.0–36.0)
MCV: 88.9 fL (ref 80.0–100.0)
Platelets: 175 10*3/uL (ref 150–400)
RBC: 5.03 MIL/uL (ref 4.22–5.81)
RDW: 14.2 % (ref 11.5–15.5)
WBC: 5.4 10*3/uL (ref 4.0–10.5)
nRBC: 0 % (ref 0.0–0.2)

## 2021-06-06 LAB — BASIC METABOLIC PANEL
Anion gap: 6 (ref 5–15)
BUN: 15 mg/dL (ref 6–20)
CO2: 30 mmol/L (ref 22–32)
Calcium: 8.7 mg/dL — ABNORMAL LOW (ref 8.9–10.3)
Chloride: 103 mmol/L (ref 98–111)
Creatinine, Ser: 0.78 mg/dL (ref 0.61–1.24)
GFR, Estimated: >60 mL/min (ref >60)
Glucose, Bld: 111 mg/dL — ABNORMAL HIGH (ref 70–99)
Potassium: 4.2 mmol/L (ref 3.5–5.1)
Sodium: 139 mmol/L (ref 135–145)

## 2021-06-06 LAB — BRAIN NATRIURETIC PEPTIDE: B Natriuretic Peptide: 16.6 pg/mL (ref 0.0–100.0)

## 2021-06-06 LAB — TROPONIN I (HIGH SENSITIVITY)
Troponin I (High Sensitivity): 3 ng/L (ref <18)
Troponin I (High Sensitivity): 3 ng/L (ref <18)

## 2021-06-06 NOTE — Discharge Instructions (Signed)
As discussed, your evaluation today has been largely reassuring.  But, it is important that you monitor your condition carefully, and do not hesitate to return to the ED if you develop new, or concerning changes in your condition. ? ?Otherwise, please follow-up with your physician for appropriate ongoing care. ? ?

## 2021-06-06 NOTE — ED Triage Notes (Signed)
Patient c/o left arm pain since last night. ? ?Patient denies any chest pain or increased SOB. 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 ? ?

## 2021-06-06 NOTE — ED Provider Notes (Signed)
?[facility] DEPT ?Provider Note ? ? ?CSN: [id number] ?Arrival date & time: 06/06/21  5456 ? ?  ? ?History ? ?Chief Complaint  ?Patient presents with  ? Arm Pain  ? ? ?Scott [name] is a 61 y.o. male. ? ?HPI ?Patient presents with left arm pain.  He has a history of multiple medical issues including cervical radiculopathy, MI, CHF, wears home oxygen.  Over the past hours he had left arm pain, this is improved spontaneously, but was severe earlier.  No obvious precipitant, no clear alleviating or exacerbating factors.  He has been taking all medication as directed. ?  ? ?Home Medications ?Prior to Admission medications   ?Medication Sig Start Date End Date Taking? Authorizing Provider  ?ADVAIR DISKUS 250-50 MCG/ACT AEPB Inhale 1 puff into the lungs 2 (two) times daily. 05/24/21   Collene Gobble, MD  ?aspirin 325 MG EC tablet Take 81 mg by mouth daily.    [provider]  ?bisoprolol (ZEBETA) 5 MG tablet TAKE 1 TABLET EVERY DAY 01/04/21   Minus Breeding, MD  ?buprenorphine-naloxone (SUBOXONE) 8-2 mg SUBL SL tablet Place 1 tablet under the tongue daily.    [provider]  ?buPROPion [facility]) 150 MG 12 hr tablet  07/03/19   [provider]  ?diazepam (VALIUM) 5 MG tablet Take 5 mg by mouth 2 (two) times daily as needed for anxiety. 08/07/18   [provider]  ?ENTRESTO 49-51 MG TAKE 1 TABLET TWICE DAILY 01/04/21   Minus Breeding, MD  ?fluticasone [facility]) 50 MCG/ACT nasal spray USE 2 SPRAYS NASALLY EVERY DAY 05/25/21   Collene Gobble, MD  ?furosemide (LASIX) 20 MG tablet Take 1 tablet (20 mg total) by mouth every other day. 03/10/21   Deberah Pelton, NP  ?gabapentin (NEURONTIN) 300 MG capsule TAKE 1 CAPSULE BY MOUTH 3 TIMES DAILY 11/21/18   Jacelyn Pi, Lilia Argue, MD  ?Ipratropium-Albuterol (COMBIVENT RESPIMAT) 20-100 MCG/ACT AERS respimat INHALE 1 PUFF FOUR TIMES DAILY 08/10/20   Collene Gobble, MD  ?ipratropium-albuterol (DUONEB) 0.5-2.5 (3)  MG/3ML SOLN INHALE THE CONTENTS OF 1 VIAL VIA NEBULIZER EVERY 6 HOURS AS NEEDED 05/25/21   Collene Gobble, MD  ?JANUMET 50-500 MG tablet TAKE 1 TABLET BY MOUTH ONCE A DAY 07/31/18   Jacelyn Pi, Lilia Argue, MD  ?[facility] 4 MG/0.1ML LIQD nasal spray kit Place 1 spray into the nose daily as needed (For overdose).  06/13/18   [provider]  ?nystatin (MYCOSTATIN) 100000 UNIT/ML suspension Take 5 mLs (500,000 Units total) by mouth 4 (four) times daily. 03/08/20   Minus Breeding, MD  ?omeprazole (PRILOSEC) 40 MG capsule TAKE 1 CAPSULE BY MOUTH EVERY DAY 01/08/19   Jacelyn Pi, Lilia Argue, MD  ?oxyCODONE-acetaminophen (PERCOCET) 10-325 MG tablet Take 1 tablet by mouth every 4 (four) hours. 05/28/18   Oretha Milch D, MD  ?rosuvastatin (CRESTOR) 10 MG tablet Take 1 tablet (10 mg total) by mouth daily. 05/07/20   Lendon Colonel, NP  ?VENTOLIN HFA 108 (90 Base) MCG/ACT inhaler INHALE 1 TO 2 PUFFS EVERY 6 HOURS AS NEEDED FOR WHEEZING OR SHORTNESS OF BREATH 05/24/21   Collene Gobble, MD  ?Vitamin D, Ergocalciferol, (DRISDOL) 1.25 MG (50000 UT) CAPS capsule TAKE ONE CAPSULE BY MOUTH ONCE WEEKLY 12/18/18   Jacelyn Pi, Lilia Argue, MD  ?   ? ?Allergies    ?Penicillins and Cymbalta [duloxetine hcl]   ? ?Review of Systems   ?Review of Systems  ?Constitutional:   ?  Per HPI, otherwise negative  ?HENT:    ?     Per HPI, otherwise negative  ?Respiratory:    ?     Per HPI, otherwise negative  ?Cardiovascular:   ?     Per HPI, otherwise negative  ?Gastrointestinal:  Negative for vomiting.  ?Endocrine:  ?     Negative aside from HPI  ?Genitourinary:   ?     Neg aside from HPI   ?Musculoskeletal:   ?     Per HPI, otherwise negative  ?Skin: Negative.   ?Neurological:  Negative for syncope.  ? ?Physical Exam ?Updated Vital Signs ?BP 122/76   Pulse 75   Temp 97.7 ?F (36.5 ?C) (Oral)   Resp 18   Ht 5' 8" (1.727 m)   Wt 86.9 kg   SpO2 92%   BMI 29.13 kg/m?  ?Physical Exam ?Vitals and nursing note reviewed.  ?Constitutional:   ?    General: He is not in acute distress. ?   Appearance: He is well-developed.  ?HENT:  ?   Head: Normocephalic and atraumatic.  ?Eyes:  ?   Conjunctiva/sclera: Conjunctivae normal.  ?Cardiovascular:  ?   Rate and Rhythm: Normal rate and regular rhythm.  ?Pulmonary:  ?   Effort: Pulmonary effort is normal. No respiratory distress.  ?   Breath sounds: No stridor.  ?   Comments: On 3 L, no increased work of breathing ?Abdominal:  ?   General: There is no distension.  ?Musculoskeletal:  ?   Comments: Left arm exam unremarkable  ?Skin: ?   General: Skin is warm and dry.  ?Neurological:  ?   Mental Status: He is alert and oriented to person, place, and time.  ? ? ?ED Results / Procedures / Treatments   ?Labs ?(all labs ordered are listed, but only abnormal results are displayed) ?Labs Reviewed  ?BASIC METABOLIC PANEL - Abnormal; Notable for the following components:  ?    Result Value  ? Glucose, Bld 111 (*)   ? Calcium 8.7 (*)   ? All other components within normal limits  ?CBC  ?BRAIN NATRIURETIC PEPTIDE  ?TROPONIN I (HIGH SENSITIVITY)  ?TROPONIN I (HIGH SENSITIVITY)  ? ? ?EKG ?EKG Interpretation ? ?Date/Time:  Monday Jun 06 2021 09:10:24 EDT ?Ventricular Rate:  69 ?PR Interval:  248 ?QRS Duration: 101 ?QT Interval:  436 ?QTC Calculation: 468 ?R Axis:   58 ?Text Interpretation: Sinus rhythm Prolonged PR interval Artifact Abnormal ECG Confirmed by Carmin Muskrat [id number]) on 06/06/2021 9:35:10 AM ? ?Radiology ?No results found. ? ?Procedures ?Procedures  ? ? ?Medications Ordered in ED ?Medications - No data to display ? ?ED Course/ Medical Decision Making/ A&P ?This patient with a Hx of CHF, MI, hypertension presents to the ED for concern of left arm pain, this involves an extensive number of treatment options, and is a complaint that carries with it a high risk of complications and morbidity.   ? ?The differential diagnosis includes atypical ACS, cervical radiculopathy, heart failure exacerbation less likely ? ? ?Social  Determinants of Health: ? ?Age, medical issues, home oxygen dependency ? ?Additional history obtained: ? ?Additional history and/or information obtained from wife, notable for HPI as above ? ? ?After the initial evaluation, orders, including: Labs x-ray monitoring were initiated. ? ? ?Patient placed on Cardiac and Pulse-Oximetry Monitors. ?The patient was maintained on a cardiac monitor.  The cardiac monitored showed an rhythm of 70 sinus normal ?The patient was also maintained on pulse oximetry. The readings were  typically 100% 3 L nasal cannula abnormal ? ? ?On repeat evaluation of the patient improved ? ?Lab Tests: ? ?I personally interpreted labs.  The pertinent results include: 2 normal troponin ? ?Imaging Studies ordered: ? ?I independently visualized and interpreted imaging which showed unremarkable chest x-ray ?I agree with the radiologist interpretation ? ? ?Dispostion / Final MDM: ? ?After consideration of the diagnostic results and the patient's response to treatment, this adult male presenting with left arm pain, concern for atypical ACS given his history, elevated risk profile was otherwise in no distress, distally neurovascularly intact, including on the affected arm.  No evidence for bacteremia, pneumonia, sepsis. ?No evidence for atypical ACS, little evidence for aortic disruption given the preserved pulses bilaterally and absence of neuro complaints.  Patient discharged in stable condition to follow-up closely. ? ?Final Clinical Impression(s) / ED Diagnoses ?Final diagnoses:  ?Left arm pain  ? ?  ?Carmin Muskrat, MD ?06/06/21 1712 ? ?

## 2021-07-04 ENCOUNTER — Other Ambulatory Visit: Payer: Self-pay | Admitting: Emergency Medicine

## 2021-07-04 DIAGNOSIS — J449 Chronic obstructive pulmonary disease, unspecified: Secondary | ICD-10-CM

## 2021-07-07 ENCOUNTER — Telehealth: Payer: Self-pay | Admitting: Emergency Medicine

## 2021-07-07 DIAGNOSIS — J449 Chronic obstructive pulmonary disease, unspecified: Secondary | ICD-10-CM

## 2021-07-07 NOTE — Telephone Encounter (Signed)
Received message from the answering service where the patient called at 6:27pm yesterday requesting due-neb and combivent refills sent to [facility] on [address].  Please advise.

## 2021-07-08 MED ORDER — VENTOLIN HFA 108 (90 BASE) MCG/ACT IN AERS: INHALATION_SPRAY | RESPIRATORY_TRACT | 1 refills | Status: DC

## 2021-07-08 MED ORDER — ADVAIR DISKUS 250-50 MCG/ACT IN AEPB: 1.0000 | INHALATION_SPRAY | Freq: Two times a day (BID) | RESPIRATORY_TRACT | 1 refills | Status: DC

## 2021-07-08 MED ORDER — IPRATROPIUM-ALBUTEROL 0.5-2.5 (3) MG/3ML IN SOLN: RESPIRATORY_TRACT | 3 refills | Status: DC

## 2021-07-08 MED ORDER — COMBIVENT RESPIMAT 20-100 MCG/ACT IN AERS: INHALATION_SPRAY | RESPIRATORY_TRACT | 3 refills | Status: DC

## 2021-07-08 NOTE — Telephone Encounter (Signed)
Spoke with patient in office. He stated that he needed refills on all of his pulmonary medications to be sent to [facility] on [address].   RXs have been sent.   Nothing further needed at time of call.

## 2021-07-08 NOTE — Telephone Encounter (Signed)
Patient states needs refill for Douneb and Combivent. Pharmacy is [facility]. Patient phone number is [phone].

## 2021-08-06 ENCOUNTER — Other Ambulatory Visit: Payer: Self-pay | Admitting: Adult Health

## 2021-08-08 ENCOUNTER — Other Ambulatory Visit: Payer: Self-pay

## 2021-08-09 MED ORDER — BISOPROLOL FUMARATE 5 MG PO TABS: 5.0000 mg | ORAL_TABLET | Freq: Every day | ORAL | 3 refills | Status: DC

## 2021-08-09 MED ORDER — FUROSEMIDE 20 MG PO TABS: 20.0000 mg | ORAL_TABLET | ORAL | 3 refills | Status: DC

## 2021-08-09 MED ORDER — ENTRESTO 49-51 MG PO TABS: 1.0000 | ORAL_TABLET | Freq: Two times a day (BID) | ORAL | 3 refills | Status: DC

## 2021-09-12 ENCOUNTER — Ambulatory Visit: Payer: Medicare HMO | Admitting: Cardiology

## 2021-09-12 NOTE — Progress Notes (Unsigned)
Office Visit    Patient Name: Scott [name] Date of Encounter: 09/13/2021  Primary Care Provider:  Sherald Hess., MD Primary Cardiologist:  Minus Breeding, MD  Chief Complaint    61 year old male with a history of chronic systolic heart failure with improved EF, NICM, hypertension, hyperlipidemia, CKD, type 2 diabetes, COPD on home O2, tobacco use, and chronic pain syndrome who presents for follow-up related to heart failure and hypertension.  Past Medical History    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   COPD (chronic obstructive pulmonary disease) ([place])    Diabetes mellitus without complication ([place])    GERD (gastroesophageal reflux disease)    Headache(784.0)    Heart attack ([place])    Pneumonia    hx   Past Surgical History:  Procedure Laterality Date   ANTERIOR CERVICAL DECOMP/DISCECTOMY FUSION  12/22/2010   Procedure: ANTERIOR CERVICAL DECOMPRESSION/DISCECTOMY FUSION 3 LEVELS;  Surgeon: Ophelia Charter;  Location: [facility];  Service: Neurosurgery;  Laterality: N/A;  Anterior cervical discectomy with fusion cervical three-four, four-five, and five sixCDF with Interbody Prosthesis, plating, and Bone Graft    ANTERIOR CERVICAL DECOMP/DISCECTOMY FUSION N/A 10/16/2012   Procedure: CERVICAL SIX-SEVEN ANTERIOR CERVICAL DECOMPRESSION/DISCECTOMY FUSION WITH INTERBODY PROTHESIS PLATING BONEGRAFT WITH /POSSIBLE HARDWARE REMOVAL OLD PLATE;  Surgeon: Ophelia Charter, MD;  Location: [facility];  Service: Neurosurgery;  Laterality: N/A;   MULTIPLE TOOTH EXTRACTIONS     OTHER SURGICAL HISTORY     surgery on cheekbone and head for fall 2000   OTHER SURGICAL HISTORY     states when about 61yr old he was urinating blood, 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 and  had surgery for this   RIGHT/LEFT HEART CATH AND CORONARY ANGIOGRAPHY N/A 09/23/2018   Procedure: RIGHT/LEFT HEART CATH AND CORONARY ANGIOGRAPHY and possible  PCI/stent;  Surgeon: MBurnell Blanks MD;  [facility];  Service: Cardiovascular;  Laterality: N/A;   SPINE SURGERY     2014 and 2016 - Dr JArnoldo Morale  TONSILLECTOMY      Allergies  Allergies  Allergen Reactions   Penicillins Anaphylaxis    Has patient had a PCN reaction causing immediate rash, facial/tongue/throat swelling, SOB or lightheadedness with hypotension: Yes Has patient had a PCN reaction causing severe rash involving mucus membranes or skin necrosis: Yes Has patient had a PCN reaction that required hospitalization: Yes Has patient had a PCN reaction occurring within the last 10 years: No If all of the above answers are "NO", then may proceed with Cephalosporin use.    Cymbalta [Duloxetine Hcl]     "crazy thoughts"    History of Present Illness    61year old male with the above past medical history including chronic systolic heart failure with improved EF, NICM, hypertension, hyperlipidemia, CKD, type 2 diabetes, COPD on home O2, tobacco use, and chronic pain syndrome.   Cardiac catheterization in 2020 showed no evidence of CAD, NICM (EF 30-35%), and elevated filling pressures.  Repeat echocardiogram in May 2022 showed EF 60 to 65%, G1 DD, no mitral valve regurgitation.  Of note, he follows with pulmonology for history of COPD and is O2 dependent on 3 L home O2.  He was last seen in the office on 03/10/2021 and was stable from a cardiac standpoint.  His Lasix was decreased to every other day use.  Activity was encouraged as tolerated.  Smoking  cessation was advised.    He presents today for follow-up.  Since his last visit he has done well from a cardiac standpoint. He denies any worsening dyspnea, denies chest pain, edema, pnd, orthopnea, weight gain.  He is working on quitting smoking but is finding this rather difficult.  He was recently started on Wellbutrin per his pulmonologist.  Overall, he reports feeling well and denies any additional concerns  today.  Home Medications    Current Outpatient Medications  Medication Sig Dispense Refill   ADVAIR DISKUS 250-50 MCG/ACT AEPB Inhale 1 puff into the lungs 2 (two) times daily. 180 each 1   aspirin 325 MG EC tablet Take 81 mg by mouth daily.     buprenorphine-naloxone (SUBOXONE) 8-2 mg SUBL SL tablet Place 1 tablet under the tongue daily.     buPROPion (WELLBUTRIN SR) 150 MG 12 hr tablet      diazepam (VALIUM) 5 MG tablet Take 5 mg by mouth 2 (two) times daily as needed for anxiety.     fluticasone (FLONASE) 50 MCG/ACT nasal spray USE 2 SPRAYS NASALLY EVERY DAY 48 g 3   gabapentin (NEURONTIN) 300 MG capsule TAKE 1 CAPSULE BY MOUTH 3 TIMES DAILY 90 capsule 0   Ipratropium-Albuterol (COMBIVENT RESPIMAT) 20-100 MCG/ACT AERS respimat INHALE 1 PUFF FOUR TIMES DAILY 12 g 3   ipratropium-albuterol (DUONEB) 0.5-2.5 (3) MG/3ML SOLN Inhale 1 vial via nebulizer every 6 hours as needed 1080 mL 3   JANUMET 50-500 MG tablet TAKE 1 TABLET BY MOUTH ONCE A DAY 90 tablet 0   NARCAN 4 MG/0.1ML LIQD nasal spray kit Place 1 spray into the nose daily as needed (For overdose).      nystatin (MYCOSTATIN) 100000 UNIT/ML suspension Take 5 mLs (500,000 Units total) by mouth 4 (four) times daily. 60 mL 0   omeprazole (PRILOSEC) 40 MG capsule TAKE 1 CAPSULE BY MOUTH EVERY DAY 30 capsule 0   oxyCODONE-acetaminophen (PERCOCET) 10-325 MG tablet Take 1 tablet by mouth every 4 (four) hours. 15 tablet 0   VENTOLIN HFA 108 (90 Base) MCG/ACT inhaler INHALE 1 TO 2 PUFFS BY MOUTH EVERY 6 HOURS AS NEEDED FOR WHEEZING OR SHORTNESS OF BREATH 18 g 1   Vitamin D, Ergocalciferol, (DRISDOL) 1.25 MG (50000 UT) CAPS capsule TAKE ONE CAPSULE BY MOUTH ONCE WEEKLY 12 capsule 0   bisoprolol (ZEBETA) 5 MG tablet Take 1 tablet (5 mg total) by mouth daily. 90 tablet 3   furosemide (LASIX) 20 MG tablet Take 1 tablet (20 mg total) by mouth every other day. 45 tablet 3   rosuvastatin (CRESTOR) 10 MG tablet Take 1 tablet (10 mg total) by mouth daily.  90 tablet 3   sacubitril-valsartan (ENTRESTO) 49-51 MG Take 1 tablet by mouth 2 (two) times daily. 180 tablet 3   No current facility-administered medications for this visit.     Review of Systems    He denies chest pain, palpitations, dyspnea, pnd, orthopnea, n, v, dizziness, syncope, edema, weight gain, or early satiety. All other systems reviewed and are otherwise negative except as noted above.   Physical Exam    VS:  BP 110/70   Pulse 74   Wt 191 lb 3.2 oz (86.7 kg)   SpO2 (!) 88%   BMI 29.07 kg/m  GEN: Well nourished, well developed, in no acute distress. HEENT: normal. Neck: Supple, no JVD, carotid bruits, or masses. Cardiac: RRR, no murmurs, rubs, or gallops. No clubbing, cyanosis, edema.  Radials/DP/PT 2+ and equal bilaterally.  Respiratory:  Respirations regular and unlabored, clear to auscultation bilaterally. GI: Soft, nontender, nondistended, BS + x 4. MS: no deformity or atrophy. Skin: warm and dry, no rash. Neuro:  Strength and sensation are intact. Psych: Normal affect.  Accessory Clinical Findings    ECG personally reviewed by me today - No EKG in office today.    Lab Results  Component Value Date   WBC 5.4 06/06/2021   HGB 14.7 06/06/2021   HCT 44.7 06/06/2021   MCV 88.9 06/06/2021   PLT 175 06/06/2021   Lab Results  Component Value Date   CREATININE 0.78 06/06/2021   BUN 15 06/06/2021   NA 139 06/06/2021   K 4.2 06/06/2021   CL 103 06/06/2021   CO2 30 06/06/2021   Lab Results  Component Value Date   ALT 8 03/10/2021   AST 15 03/10/2021   ALKPHOS 78 03/10/2021   BILITOT 0.5 03/10/2021   Lab Results  Component Value Date   CHOL 119 03/10/2021   HDL 40 03/10/2021   LDLCALC 64 03/10/2021   TRIG 75 03/10/2021   CHOLHDL 3.0 03/10/2021    Lab Results  Component Value Date   HGBA1C 7.0 (H) 06/29/2019    Assessment & Plan    1. Chronic systolic heart failure/NICM: Cath in 2020 showed no evidence of CAD, NICM (EF 30-35%), and elevated  filling pressures.  Repeat echo in May 2022 showed EF 60 to 65%, G1 DD, no mitral valve regurgitation. Euvolemic and well compensated on exam.  Continue bisoprolol, Lasix, Entresto.  2. Hypertension: BP well controlled. Continue current antihypertensive regimen.   3. Hyperlipidemia: LDL was 64 in 02/2021.  Continue aspirin, Crestor.  4. COPD: On 3 L home O2. Following with pulmonology.   5. Tobacco use: Full cessation advised.  He recently started Wellbutrin and is actively working on quitting smoking.  6. Disposition: Follow-up in 6 months.  Lenna Sciara, NP 09/13/2021, 8:51 AM

## 2021-09-13 ENCOUNTER — Ambulatory Visit (INDEPENDENT_AMBULATORY_CARE_PROVIDER_SITE_OTHER): Payer: Medicare HMO | Admitting: Nurse Practitioner

## 2021-09-13 ENCOUNTER — Encounter: Payer: Self-pay | Admitting: Nurse Practitioner

## 2021-09-13 VITALS — BP 110/70 | HR 74 | Wt 191.2 lb

## 2021-09-13 DIAGNOSIS — E7800 Pure hypercholesterolemia, unspecified: Secondary | ICD-10-CM

## 2021-09-13 DIAGNOSIS — I10 Essential (primary) hypertension: Secondary | ICD-10-CM | POA: Diagnosis not present

## 2021-09-13 DIAGNOSIS — I5022 Chronic systolic (congestive) heart failure: Secondary | ICD-10-CM

## 2021-09-13 DIAGNOSIS — F17200 Nicotine dependence, unspecified, uncomplicated: Secondary | ICD-10-CM

## 2021-09-13 DIAGNOSIS — I428 Other cardiomyopathies: Secondary | ICD-10-CM | POA: Diagnosis not present

## 2021-09-13 DIAGNOSIS — J441 Chronic obstructive pulmonary disease with (acute) exacerbation: Secondary | ICD-10-CM

## 2021-09-13 MED ORDER — FUROSEMIDE 20 MG PO TABS: 20.0000 mg | ORAL_TABLET | ORAL | 3 refills | Status: DC

## 2021-09-13 MED ORDER — ENTRESTO 49-51 MG PO TABS: 1.0000 | ORAL_TABLET | Freq: Two times a day (BID) | ORAL | 3 refills | Status: DC

## 2021-09-13 MED ORDER — ROSUVASTATIN CALCIUM 10 MG PO TABS: 10.0000 mg | ORAL_TABLET | Freq: Every day | ORAL | 3 refills | Status: DC

## 2021-09-13 MED ORDER — BISOPROLOL FUMARATE 5 MG PO TABS: 5.0000 mg | ORAL_TABLET | Freq: Every day | ORAL | 3 refills | Status: DC

## 2021-09-13 NOTE — Patient Instructions (Signed)
Medication Instructions:  Your physician recommends that you continue on your current medications as directed. Please refer to the Current Medication list given to you today.   *If you need a refill on your cardiac medications before your next appointment, please call your pharmacy*   Lab Work: NONE ordered at this time of appointment   If you have labs (blood work) drawn today and your tests are completely normal, you will receive your results only by: MyChart Message (if you have MyChart) OR A paper copy in the mail If you have any lab test that is abnormal or we need to change your treatment, we will call you to review the results.   Testing/Procedures: NONE ordered at this time of appointment    Follow-Up: At [facility], you and your health needs are our priority.  As part of our continuing mission to provide you with exceptional heart care, we have created designated Provider Care Teams.  These Care Teams include your primary Cardiologist (physician) and Advanced Practice Providers (APPs -  Physician Assistants and Nurse Practitioners) who all work together to provide you with the care you need, when you need it.  We recommend signing up for the patient portal called "MyChart".  Sign up information is provided on this After Visit Summary.  MyChart is used to connect with patients for Virtual Visits (Telemedicine).  Patients are able to view lab/test results, encounter notes, upcoming appointments, etc.  Non-urgent messages can be sent to your provider as well.   To learn more about what you can do with MyChart, go to [email or web address].    Your next appointment:   6 month(s)  The format for your next appointment:   In Person  Provider:   James Hochrein, MD     Other Instructions   Important Information About Sugar       

## 2021-11-15 ENCOUNTER — Ambulatory Visit (INDEPENDENT_AMBULATORY_CARE_PROVIDER_SITE_OTHER): Payer: Medicare HMO | Admitting: Emergency Medicine

## 2021-11-15 ENCOUNTER — Encounter: Payer: Self-pay | Admitting: Emergency Medicine

## 2021-11-15 DIAGNOSIS — J9611 Chronic respiratory failure with hypoxia: Secondary | ICD-10-CM

## 2021-11-15 DIAGNOSIS — F17200 Nicotine dependence, unspecified, uncomplicated: Secondary | ICD-10-CM

## 2021-11-15 DIAGNOSIS — J439 Emphysema, unspecified: Secondary | ICD-10-CM | POA: Diagnosis not present

## 2021-11-15 NOTE — Assessment & Plan Note (Signed)
Collections on staying off cigarettes! We will stop your Advair. Please continue your Combivent 2 puffs 4 times a day on a schedule. You can use your DuoNeb as a substitute for Combivent, up to 4 times a day. Keep albuterol available to use either 1 nebulizer treatment or 2 puffs when you needed for shortness of breath, chest tightness, wheezing. Flu shot is up-to-date You would benefit from getting the COVID-19 vaccine this fall Follow Dr. Lamonte Sakai in March after your CT chest to review or sooner if you have any problems.

## 2021-11-15 NOTE — Progress Notes (Signed)
Subjective:    Patient ID: Scott [name], male    DOB: 1960-10-08, 61 y.o.   MRN: [id number]  HPI  ROV 04/01/2021 --61 year old gentleman with a history of severe obstructive lung disease, continues to smoke.  He also has a history of allergic rhinitis, GERD and chronic cough.  Hypoxemic respiratory failure on 4 L/min via POC.  He is currently managed on Advair, scheduled DuoNeb/combivent, albuterol as needed > 4-6x a day On flonase, omeprazole   LDCT 09/14/20 reviewed by me shows several new pulmonary nodules.  RADS 3, recommended 2-month follow-up   ROV 05/24/21 --follow-up visit 61 year old man with severe obstructive lung disease.  He continues to smoke.  He has associated hypoxemic respiratory failure on 4 L/min via POC.  Also with chronic cough in the setting of allergic rhinitis, GERD.  Currently managed on DuoNeb/Combivent 4 times daily, Advair twice daily, albuterol as needed.  He is on fluticasone nasal spray and omeprazole for his contributors to cough.  Today he reports that he is overall doing well. No real changes - he can walk, sometimes does so off his O2. Occasional wheeze and cough. He is using albuterol about 3-5x a day.  He is down to 2 cig a day.   Lung cancer screening CT 04/08/2021 reviewed by me showed centrilobular and bullous emphysematous change, stable pulmonary nodules, 4.7 mm or less.  RADS 2 study, recommended repeat in 1 year   ROV 11/15/2021 --[name] is 11, with a history of tobacco use, severe obstructive lung disease and associated hypoxemic respiratory failure.  I last saw him in May.  Today he reports that he has been feeling pretty good. Occasional cough, has phlegm in his throat. No flares since last visit. He quit smoking 11/7 He has been managed on Advair twice daily but he stopped it, uses either Combivent or DuoNeb 4 times daily on a schedule.  He has albuterol which he uses rarely.  Fluticasone nasal spray, omeprazole Oxygen at 3-4L/min, no desats noted.  He is due for walking oximetry.  Due for repeat lung cancer screening CT in March 2024 Flu shot up to date, has not had the most recent COVID.         No data to display          Objective:   Physical Exam  Vitals:   11/15/21 0935  BP: 110/66  Pulse: 74  Temp: (!) 97.5 F (36.4 C)  SpO2: 93%  Weight: 194 lb 3.2 oz (88.1 kg)  Height: 5\' 7"  (1.702 m)    Gen: Pleasant, well-nourished, in no distress  ENT: No lesions,  mouth clear,  oropharynx clear, no postnasal drip,   Neck: No JVD, no stridor  Lungs: mostly clear, some inspiratory wheeze, bilateral end expiratory wheeze  Cardiovascular: RRR, heart sounds normal, no murmur or gallops, trace peripheral edema  Musculoskeletal: No deformities, no cyanosis or clubbing  Neuro: alert, appropriate, non-focal  Skin: Warm     Assessment & Plan:  COPD (chronic obstructive pulmonary disease) Collections on staying off cigarettes! We will stop your Advair. Please continue your Combivent 2 puffs 4 times a day on a schedule. You can use your DuoNeb as a substitute for Combivent, up to 4 times a day. Keep albuterol available to use either 1 nebulizer treatment or 2 puffs when you needed for shortness of breath, chest tightness, wheezing. Flu shot is up-to-date You would benefit from getting the COVID-19 vaccine this fall Follow Dr. Lamonte [name] in March after your CT chest  to review or sooner if you have any problems.  Chronic respiratory failure with hypoxia (HCC) He needs a repeat walking oximetry today to retitrate his POC.  We will work on this in office  Tobacco use disorder He quit last November.  Congratulated him on this.  He is due for his repeat screening CT chest in March 2024.  We will arrange for this      Levy Pupa, MD, PhD 11/15/2021, 9:56 AM [facility] [phone] or if no answer [phone]

## 2021-11-15 NOTE — Patient Instructions (Addendum)
Collections on staying off cigarettes! We will stop your Advair. Please continue your Combivent 2 puffs 4 times a day on a schedule. You can use your DuoNeb as a substitute for Combivent, up to 4 times a day. Keep albuterol available to use either 1 nebulizer treatment or 2 puffs when you needed for shortness of breath, chest tightness, wheezing. Flu shot is up-to-date You would benefit from getting the COVID-19 vaccine this fall We will repeat your walking oximetry today to confirm appropriate oxygen dosing on your portable oxygen concentrator You are due for your repeat lung cancer screening CT scan of the chest in March/2024. Follow Dr. Lamonte Sakai in March after your CT chest to review or sooner if you have any problems.

## 2021-11-15 NOTE — Assessment & Plan Note (Signed)
He needs a repeat walking oximetry today to retitrate his POC.  We will work on this in office

## 2021-11-15 NOTE — Assessment & Plan Note (Signed)
He quit last November.  Congratulated him on this.  He is due for his repeat screening CT chest in March 2024.  We will arrange for this

## 2022-02-18 ENCOUNTER — Telehealth: Payer: Self-pay | Admitting: Pulmonary Disease

## 2022-02-18 ENCOUNTER — Other Ambulatory Visit: Payer: Self-pay | Admitting: Emergency Medicine

## 2022-02-18 DIAGNOSIS — J449 Chronic obstructive pulmonary disease, unspecified: Secondary | ICD-10-CM

## 2022-02-18 MED ORDER — VENTOLIN HFA 108 (90 BASE) MCG/ACT IN AERS: INHALATION_SPRAY | RESPIRATORY_TRACT | 1 refills | Status: DC

## 2022-02-18 NOTE — Telephone Encounter (Signed)
Thank you :)

## 2022-02-18 NOTE — Telephone Encounter (Signed)
He needs refill for ventolin.  Confirmed he wants script sent to [facility] on [facility].  Script sent.

## 2022-03-11 ENCOUNTER — Encounter (HOSPITAL_COMMUNITY): Payer: Self-pay

## 2022-03-11 ENCOUNTER — Emergency Department (HOSPITAL_COMMUNITY)
Admission: EM | Admit: 2022-03-11 | Discharge: 2022-03-11 | Disposition: A | Discharge status: Home / Self Care | Payer: Medicare HMO | Attending: Emergency Medicine | Admitting: Emergency Medicine

## 2022-03-11 ENCOUNTER — Other Ambulatory Visit: Payer: Self-pay

## 2022-03-11 ENCOUNTER — Emergency Department (HOSPITAL_COMMUNITY): Payer: Medicare HMO

## 2022-03-11 DIAGNOSIS — G8929 Other chronic pain: Secondary | ICD-10-CM | POA: Diagnosis not present

## 2022-03-11 DIAGNOSIS — I700 Atherosclerosis of aorta: Secondary | ICD-10-CM | POA: Insufficient documentation

## 2022-03-11 DIAGNOSIS — R079 Chest pain, unspecified: Secondary | ICD-10-CM | POA: Diagnosis not present

## 2022-03-11 DIAGNOSIS — Z79891 Long term (current) use of opiate analgesic: Secondary | ICD-10-CM | POA: Insufficient documentation

## 2022-03-11 DIAGNOSIS — H9313 Tinnitus, bilateral: Secondary | ICD-10-CM | POA: Diagnosis present

## 2022-03-11 DIAGNOSIS — R9431 Abnormal electrocardiogram [ECG] [EKG]: Secondary | ICD-10-CM | POA: Insufficient documentation

## 2022-03-11 LAB — BASIC METABOLIC PANEL
Anion gap: 8 (ref 5–15)
BUN: 11 mg/dL (ref 8–23)
CO2: 26 mmol/L (ref 22–32)
Calcium: 8.9 mg/dL (ref 8.9–10.3)
Chloride: 106 mmol/L (ref 98–111)
Creatinine, Ser: 0.65 mg/dL (ref 0.61–1.24)
GFR, Estimated: >60 mL/min (ref >60)
Glucose, Bld: 99 mg/dL (ref 70–99)
Potassium: 4.4 mmol/L (ref 3.5–5.1)
Sodium: 140 mmol/L (ref 135–145)

## 2022-03-11 LAB — SALICYLATE LEVEL: Salicylate Lvl: <7 mg/dL — ABNORMAL LOW (ref 7.0–30.0)

## 2022-03-11 LAB — CBC
HCT: 48.1 % (ref 39.0–52.0)
Hemoglobin: 15.1 g/dL (ref 13.0–17.0)
MCH: 28.6 pg (ref 26.0–34.0)
MCHC: 31.4 g/dL (ref 30.0–36.0)
MCV: 91.1 fL (ref 80.0–100.0)
Platelets: 205 10*3/uL (ref 150–400)
RBC: 5.28 MIL/uL (ref 4.22–5.81)
RDW: 14.5 % (ref 11.5–15.5)
WBC: 5.8 10*3/uL (ref 4.0–10.5)
nRBC: 0 % (ref 0.0–0.2)

## 2022-03-11 LAB — TROPONIN I (HIGH SENSITIVITY)
Troponin I (High Sensitivity): 3 ng/L (ref <18)
Troponin I (High Sensitivity): 3 ng/L (ref <18)

## 2022-03-11 LAB — MAGNESIUM: Magnesium: 2.1 mg/dL (ref 1.7–2.4)

## 2022-03-11 LAB — PHOSPHORUS: Phosphorus: 4.3 mg/dL (ref 2.5–4.6)

## 2022-03-11 NOTE — ED Notes (Signed)
Patient given [place] sandwich and returned from X-ray.

## 2022-03-11 NOTE — ED Notes (Signed)
Patient called RN into room and mad stating "I have been here for over 4 hours. I am hungry. I have things to do. I am ready to go." RN informed patient of wait time on labs and states will notify EDP.

## 2022-03-11 NOTE — Discharge Instructions (Signed)
Follow-up with your doctor next week as planned. Do not take any more aspirin products for now. Return to the emergency department if you have new or concerning symptoms.

## 2022-03-11 NOTE — ED Provider Notes (Signed)
[place] EMERGENCY DEPARTMENT AT [facility] Provider Note   CSN: [id number] Arrival date & time: 03/11/22  1105     History  Chief Complaint  Patient presents with   Chest Pain    Scott [name] is a 62 y.o. male.  HPI Patient reports he has been getting a lot of ringing in his years.  He thinks it is more associated with when he takes his medications.  He reports he is taking chronic pain management with Suboxone and Percocet.  He has been on his medications for a long time but more recently is associating increased ringing in the ears with his medication dosing.  He reports sometimes he can even hear his heartbeat thumping in his ears.  The severity of this is waxing and waning.  He feels like he gets most severe shortly after taking home medication.  With further questioning patient has been taking 325 mg of aspirin several times a day for pain control up until a week ago.  He reports that he stopped taking it so often a week ago and his doctor advised against it.  His doctor had changed him to 81 mg and for a while the patient continue to take his 81 mg and an additional couple doses of Bayer "back and body" . Patient denies any other drug or alcohol use.    Home Medications Prior to Admission medications   Medication Sig Start Date End Date Taking? Authorizing Provider  ADVAIR DISKUS 250-50 MCG/ACT AEPB Inhale 1 puff into the lungs 2 (two) times daily. 07/08/21   Collene Gobble, MD  aspirin 325 MG EC tablet Take 81 mg by mouth daily.    [provider]  bisoprolol (ZEBETA) 5 MG tablet Take 1 tablet (5 mg total) by mouth daily. 09/13/21   Lenna Sciara, NP  buprenorphine-naloxone (SUBOXONE) 8-2 mg SUBL SL tablet Place 1 tablet under the tongue daily.    [provider]  buPROPion [facility] SR) 150 MG 12 hr tablet  07/03/19   [provider]  diazepam (VALIUM) 5 MG tablet Take 5 mg by mouth 2 (two) times daily as needed for anxiety. 08/07/18    [provider]  fluticasone (FLONASE) 50 MCG/ACT nasal spray USE 2 SPRAYS NASALLY EVERY DAY 05/25/21   Collene Gobble, MD  furosemide (LASIX) 20 MG tablet Take 1 tablet (20 mg total) by mouth every other day. 09/13/21   Lenna Sciara, NP  gabapentin (NEURONTIN) 300 MG capsule TAKE 1 CAPSULE BY MOUTH 3 TIMES DAILY 11/21/18   Jacelyn Pi, Lilia Argue, MD  Ipratropium-Albuterol (COMBIVENT RESPIMAT) 20-100 MCG/ACT AERS respimat INHALE 1 PUFF FOUR TIMES DAILY 07/08/21   Collene Gobble, MD  ipratropium-albuterol (DUONEB) 0.5-2.5 (3) MG/3ML SOLN Inhale 1 vial via nebulizer every 6 hours as needed 07/08/21   Collene Gobble, MD  JANUMET 50-500 MG tablet TAKE 1 TABLET BY MOUTH ONCE A DAY 07/31/18   Jacelyn Pi, Lilia Argue, MD  [facility] 4 MG/0.1ML LIQD nasal spray kit Place 1 spray into the nose daily as needed (For overdose).  06/13/18   [provider]  nystatin (MYCOSTATIN) 100000 UNIT/ML suspension Take 5 mLs (500,000 Units total) by mouth 4 (four) times daily. 03/08/20   Minus Breeding, MD  omeprazole (PRILOSEC) 40 MG capsule TAKE 1 CAPSULE BY MOUTH EVERY DAY 01/08/19   Jacelyn Pi, Lilia Argue, MD  oxyCODONE-acetaminophen (PERCOCET) 10-325 MG tablet Take 1 tablet by mouth every 4 (four) hours. 05/28/18  Oretha Milch D, MD  rosuvastatin (CRESTOR) 10 MG tablet Take 1 tablet (10 mg total) by mouth daily. 09/13/21   Lenna Sciara, NP  sacubitril-valsartan (ENTRESTO) 49-51 MG Take 1 tablet by mouth 2 (two) times daily. 09/13/21   Lenna Sciara, NP  VENTOLIN HFA 108 (90 Base) MCG/ACT inhaler INHALE 1 TO 2 PUFFS BY MOUTH EVERY 6 HOURS AS NEEDED FOR WHEEZING OR SHORTNESS OF BREATH 02/19/22   Collene Gobble, MD  VENTOLIN HFA 108 (90 Base) MCG/ACT inhaler INHALE 1 TO 2 PUFFS BY MOUTH EVERY 6 HOURS AS NEEDED FOR WHEEZING OR SHORTNESS OF BREATH 02/18/22   Chesley Mires, MD  Vitamin D, Ergocalciferol, (DRISDOL) 1.25 MG (50000 UT) CAPS capsule TAKE ONE CAPSULE BY MOUTH ONCE WEEKLY 12/18/18   Jacelyn Pi, Lilia Argue,  MD      Allergies    Penicillins and Cymbalta [duloxetine hcl]    Review of Systems   Review of Systems  Physical Exam Updated Vital Signs BP 133/61   Pulse (!) 59   Temp 98.3 F (36.8 C) (Oral)   Resp 12   Ht 5' 7"$  (1.702 m)   Wt 88 kg   SpO2 91%   BMI 30.38 kg/m  Physical Exam Constitutional:      Comments: Alert nontoxic clinically well in appearance.  Nourished well-developed.  HENT:     Head: Normocephalic and atraumatic.     Right Ear: Tympanic membrane normal.     Left Ear: Tympanic membrane normal.     Mouth/Throat:     Mouth: Mucous membranes are moist.     Pharynx: Oropharynx is clear.  Eyes:     Extraocular Movements: Extraocular movements intact.     Conjunctiva/sclera: Conjunctivae normal.     Pupils: Pupils are equal, round, and reactive to light.  Neck:     Vascular: No carotid bruit.  Cardiovascular:     Rate and Rhythm: Normal rate and regular rhythm.  Pulmonary:     Effort: Pulmonary effort is normal.     Breath sounds: Normal breath sounds.  Abdominal:     General: There is no distension.     Palpations: Abdomen is soft.     Tenderness: There is no abdominal tenderness. There is no guarding.  Musculoskeletal:        General: No swelling or tenderness. Normal range of motion.     Cervical back: Neck supple. No rigidity or tenderness.     Right lower leg: No edema.     Left lower leg: No edema.  Lymphadenopathy:     Cervical: No cervical adenopathy.  Skin:    General: Skin is warm and dry.  Neurological:     General: No focal deficit present.     Mental Status: He is oriented to person, place, and time.     Cranial Nerves: No cranial nerve deficit.     Sensory: No sensory deficit.     Motor: No weakness.     Coordination: Coordination normal.  Psychiatric:        Mood and Affect: Mood normal.     ED Results / Procedures / Treatments   Labs (all labs ordered are listed, but only abnormal results are displayed) Labs Reviewed   SALICYLATE LEVEL - Abnormal; Notable for the following components:      Result Value   Salicylate Lvl [id number] (*)    All other components within normal limits  BASIC METABOLIC PANEL  CBC  MAGNESIUM  PHOSPHORUS  TROPONIN I (HIGH  SENSITIVITY)  TROPONIN I (HIGH SENSITIVITY)    EKG EKG Interpretation  Date/Time:  Saturday March 11 2022 12:00:46 EST Ventricular Rate:  65 PR Interval:  235 QRS Duration: 97 QT Interval:  414 QTC Calculation: 431 R Axis:   1 Text Interpretation: Sinus rhythm Prolonged PR interval Borderline T abnormalities, lateral leads artifact but no sig change from previous Confirmed by Charlesetta Shanks [phone]) on 03/11/2022 12:13:02 PM  Radiology DG Chest 2 View  Result Date: 03/11/2022 CLINICAL DATA:  62 year old male with history of chest pain. EXAM: CHEST - 2 VIEW COMPARISON:  Chest x-ray 04/25/2021. FINDINGS: Lung volumes are normal. No consolidative airspace disease. No pleural effusions. No pneumothorax. No pulmonary nodule or mass noted. Pulmonary vasculature and the cardiomediastinal silhouette are within normal limits. Atherosclerosis in the thoracic aorta. Orthopedic fixation hardware in the lower cervical spine incidentally noted. IMPRESSION: 1.  No radiographic evidence of acute cardiopulmonary disease. 2. Aortic atherosclerosis. Electronically Signed   By: Vinnie Langton M.D.   On: 03/11/2022 13:36    Procedures Procedures    Medications Ordered in ED Medications - No data to display  ED Course/ Medical Decision Making/ A&P Clinical Course as of 03/11/22 1548  Sat Mar 11, 2022  1524 Stable Has tinnitus chest pain   [CC]    Clinical Course User Index [CC] Tretha Sciara, MD                             Medical Decision Making Amount and/or Complexity of Data Reviewed Labs: ordered. Radiology: ordered.   In triage patient complained of chest pain and worsening shortness of breath but also ringing in the ears.  In my evaluation patient  focus mostly on ringing in the ears and pulsatile tinnitus.  Patient has no headache, no visual changes, no neurologic dysfunction.  Have low suspicion for stroke.  History did reveal that patient took fairly large doses of aspirin however stopped this about a week ago.  Will proceed with broad diagnostic evaluation with electrolytes and salicylate levels.  Patient's mental status is clear.  Vital signs are stable, no hypertension or tachycardia.  Workup within normal limits.  Salicylate is less than 7, 2 sets of troponin are normal.  At this time I suspect patient's tinnitus is secondary to salicylate use over the past several weeks.  He has discontinued it within the past several days.  Renal function is normal.  Patient is counseled on avoiding all salicylates, which he plans to do.  Patient does not have hypertension, visual changes or other concerning associated symptoms.  At this time stable for discharge.  Patient has follow-up with his PCP next week for further evaluation and discuss response to treatment.        Final Clinical Impression(s) / ED Diagnoses Final diagnoses:  Tinnitus of both ears  Nonspecific chest pain    Rx / DC Orders ED Discharge Orders     None         Charlesetta Shanks, MD 03/11/22 1551

## 2022-03-11 NOTE — ED Triage Notes (Signed)
Pt arrived via POV, c/o chest pain and worsening SOB and ringing in the head for several days. Worsening with mvmt.

## 2022-03-19 NOTE — Progress Notes (Unsigned)
Cardiology Office Note   Date:  03/20/2022   ID:  Scott [name], DOB 09/13/1960, MRN [id number]  PCP:  Selena Batten, MD  Cardiologist:   Minus Breeding, MD   Chief Complaint  Patient presents with   Cardiomyopathy      History of Present Illness: Scott [name] is a 62 y.o. male who presents for chronic systolic heart failure, nonischemic cardiomyopathy He has no angiographic evidence of CAD.with right heart cath revealing volume overload.  EF was 30% to 35%.   He was hospitalized in early June 2021 due to respiratory failure, COPD exacerbation, and hypotension.  At that time Entresto, Lasix, and bisoprolol were discontinued.  We have been titrating these meds back up at recent visits.  He uses O2 PRN.    He was last seen in August 2023.  He was in the ER recently .  I read these records for this report.  It looks like he was having some tinnitus was taking 500 mg of aspirin.  He wears his oxygen as needed.  He is not having any new cardiovascular problems.  He is not having any PND or orthopnea.  He has no new palpitations, presyncope or syncope.  He has had no new chest discomfort.  He has 3 grandchildren that he helps to raise to her early 8s but he has a 83 year old granddaughter.    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   COPD (chronic obstructive pulmonary disease) ([place])    Diabetes mellitus without complication ([place])    GERD (gastroesophageal reflux disease)    Headache(784.0)    Heart attack ([place])    Pneumonia    hx    Past Surgical History:  Procedure Laterality Date   ANTERIOR CERVICAL DECOMP/DISCECTOMY FUSION  12/22/2010   Procedure: ANTERIOR CERVICAL DECOMPRESSION/DISCECTOMY FUSION 3 LEVELS;  Surgeon: Ophelia Charter;  Location: [place] [facility];  Service: Neurosurgery;  Laterality: N/A;  Anterior cervical discectomy with fusion cervical three-four, four-five, and five sixCDF with Interbody  Prosthesis, plating, and Bone Graft    ANTERIOR CERVICAL DECOMP/DISCECTOMY FUSION N/A 10/16/2012   Procedure: CERVICAL SIX-SEVEN ANTERIOR CERVICAL DECOMPRESSION/DISCECTOMY FUSION WITH INTERBODY PROTHESIS PLATING BONEGRAFT WITH /POSSIBLE HARDWARE REMOVAL OLD PLATE;  Surgeon: Ophelia Charter, MD;  Location: [place] [facility];  Service: Neurosurgery;  Laterality: N/A;   MULTIPLE TOOTH EXTRACTIONS     OTHER SURGICAL HISTORY     surgery on cheekbone and head for fall 2000   OTHER SURGICAL HISTORY     states when about 62yr old he was urinating blood, 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     bisoprolol (ZEBETA) 5 MG tablet Take 1 tablet (5 mg total) by mouth daily. 90 tablet 3   diazepam (VALIUM) 5 MG tablet Take 5 mg by mouth 2 (two) times daily as needed for anxiety.     fluticasone (  FLONASE) 50 MCG/ACT nasal spray USE 2 SPRAYS NASALLY EVERY DAY 48 g 3   furosemide (LASIX) 20 MG tablet Take 1 tablet (20 mg total) by mouth every other day. 45 tablet 3   gabapentin (NEURONTIN) 300 MG capsule TAKE 1 CAPSULE BY MOUTH 3 TIMES DAILY 90 capsule 0   Ipratropium-Albuterol (COMBIVENT RESPIMAT) 20-100 MCG/ACT AERS respimat INHALE 1 PUFF FOUR TIMES DAILY 12 g 3   ipratropium-albuterol (DUONEB) 0.5-2.5 (3) MG/3ML SOLN Inhale 1 vial via nebulizer every 6 hours as needed 1080 mL 3   JANUMET 50-500 MG tablet TAKE 1 TABLET BY MOUTH ONCE A DAY 90 tablet 0   NARCAN 4 MG/0.1ML LIQD nasal spray kit Place 1 spray into the nose daily as needed (For overdose).       omeprazole (PRILOSEC) 40 MG capsule TAKE 1 CAPSULE BY MOUTH EVERY DAY 30 capsule 0   oxyCODONE-acetaminophen (PERCOCET) 10-325 MG tablet Take 1 tablet by mouth every 4 (four) hours. 15 tablet 0   rosuvastatin (CRESTOR) 10 MG tablet Take 1 tablet (10 mg total) by mouth daily. 90 tablet 3   sacubitril-valsartan (ENTRESTO) 97-103 MG Take 1 tablet by mouth 2 (two) times daily. 60 tablet 11   VENTOLIN HFA 108 (90 Base) MCG/ACT inhaler INHALE 1 TO 2 PUFFS BY MOUTH EVERY 6 HOURS AS NEEDED FOR WHEEZING OR SHORTNESS OF BREATH 18 g 1   Vitamin D, Ergocalciferol, (DRISDOL) 1.25 MG (50000 UT) CAPS capsule TAKE ONE CAPSULE BY MOUTH ONCE WEEKLY 12 capsule 0   No current facility-administered medications for this visit.    Allergies:   Penicillins and Cymbalta [duloxetine hcl]    ROS:  Please see the history of present illness.   Otherwise, review of systems are positive for none.   All other systems are reviewed and negative.    PHYSICAL EXAM: VS:  BP 116/70   Pulse 84   Ht '5\' 8"'$  (1.727 m)   Wt 188 lb 3.2 oz (85.4 kg)   SpO2 90%   BMI 28.62 kg/m  , BMI Body mass index is 28.62 kg/m. GENERAL:  Well appearing NECK:  No jugular venous distention, waveform within normal limits, carotid upstroke brisk and symmetric, no bruits, no thyromegaly LUNGS:  Clear to auscultation bilaterally CHEST:  Scattered wheezing.   HEART:  PMI not displaced or sustained,S1 and S2 within normal limits, no S3, no S4, no clicks, no rubs, no murmurs ABD:  Flat, positive bowel sounds normal in frequency in pitch, no bruits, no rebound, no guarding, no midline pulsatile mass, no hepatomegaly, no splenomegaly EXT:  2 plus pulses throughout, no edema, no cyanosis no clubbing   EKG:  EKG is  not ordered today.    Recent Labs: 06/06/2021: B Natriuretic Peptide 16.6 03/11/2022: BUN 11; Creatinine, Ser 0.65; Hemoglobin 15.1; Magnesium 2.1; Platelets 205; Potassium 4.4; Sodium 140    Lipid Panel    Component Value  Date/Time   CHOL 119 03/10/2021 1050   TRIG 75 03/10/2021 1050   HDL 40 03/10/2021 1050   CHOLHDL 3.0 03/10/2021 1050   LDLCALC 64 03/10/2021 1050      Wt Readings from Last 3 Encounters:  03/20/22 188 lb 3.2 oz (85.4 kg)  03/11/22 194 lb (88 kg)  11/15/21 194 lb 3.2 oz (88.1 kg)      Other studies Reviewed: Additional studies/ records that were reviewed today include: Labs. Review of the above records demonstrates:  Please see elsewhere in the note.     ASSESSMENT AND PLAN:  Nonischemic  cardiomyopathy:       Today I am going to increase his Entresto to 97/103 twice daily.  I will check a basic metabolic profile in 2 weeks.   We will see him back in a few months to continue to titrate meds  Tobacco abuse:   He has quit.   Hypertension:   This is being managed in the context of treating his CHF  COPD:    He wears PRN O2.  Current medicines are reviewed at length with the patient today.  The patient does not have concerns regarding medicines.  The following changes have been made: As above  Labs/ tests ordered today include:     Orders Placed This Encounter  Procedures   Basic Metabolic Panel (BMET)     Disposition:   FU with APP in 2 months.      Signed, Minus Breeding, MD  03/20/2022 8:28 AM    [place]

## 2022-03-20 ENCOUNTER — Encounter: Payer: Self-pay | Admitting: Cardiology

## 2022-03-20 ENCOUNTER — Ambulatory Visit: Payer: Medicare HMO | Attending: Cardiology | Admitting: Cardiology

## 2022-03-20 VITALS — BP 116/70 | HR 84 | Ht 68.0 in | Wt 188.2 lb

## 2022-03-20 DIAGNOSIS — Z720 Tobacco use: Secondary | ICD-10-CM | POA: Diagnosis not present

## 2022-03-20 DIAGNOSIS — I10 Essential (primary) hypertension: Secondary | ICD-10-CM

## 2022-03-20 DIAGNOSIS — J441 Chronic obstructive pulmonary disease with (acute) exacerbation: Secondary | ICD-10-CM

## 2022-03-20 DIAGNOSIS — I428 Other cardiomyopathies: Secondary | ICD-10-CM

## 2022-03-20 MED ORDER — SACUBITRIL-VALSARTAN 97-103 MG PO TABS: 1.0000 | ORAL_TABLET | Freq: Two times a day (BID) | ORAL | 11 refills | Status: DC

## 2022-03-20 NOTE — Patient Instructions (Signed)
Medication Instructions:   INCREASE ENTRESTO TO 97/103 MG TWICE DAILY=2 OF THE 49/51 MG TABLETS TWICE DAILY  *If you need a refill on your cardiac medications before your next appointment, please call your pharmacy*   Lab Work:  Your physician recommends that you return for lab work in: ONE WEEK-DO NOT NEED TO FAST  If you have labs (blood work) drawn today and your tests are completely normal, you will receive your results only by: [place] (if you have MyChart) OR A paper copy in the mail If you have any lab test that is abnormal or we need to change your treatment, we will call you to review the results.   Follow-Up: At [facility], you and your health needs are our priority.  As part of our continuing mission to provide you with exceptional heart care, we have created designated Provider Care Teams.  These Care Teams include your primary Cardiologist (physician) and Advanced Practice Providers (APPs -  Physician Assistants and Nurse Practitioners) who all work together to provide you with the care you need, when you need it.  We recommend signing up for the patient portal called "MyChart".  Sign up information is provided on this After Visit Summary.  MyChart is used to connect with patients for Virtual Visits (Telemedicine).  Patients are able to view lab/test results, encounter notes, upcoming appointments, etc.  Non-urgent messages can be sent to your provider as well.   To learn more about what you can do with MyChart, go to [email or web address].    Your next appointment:   2 month(s)  Provider:    ANY APP

## 2022-03-27 LAB — BASIC METABOLIC PANEL
BUN/Creatinine Ratio: 10 (ref 10–24)
BUN: 9 mg/dL (ref 8–27)
CO2: 26 mmol/L (ref 20–29)
Calcium: 9.7 mg/dL (ref 8.6–10.2)
Chloride: 104 mmol/L (ref 96–106)
Creatinine, Ser: 0.89 mg/dL (ref 0.76–1.27)
Glucose: 117 mg/dL — ABNORMAL HIGH (ref 70–99)
Potassium: 4.4 mmol/L (ref 3.5–5.2)
Sodium: 142 mmol/L (ref 134–144)
eGFR: 97 mL/min/{1.73_m2} (ref >59)

## 2022-03-31 ENCOUNTER — Encounter: Payer: Self-pay | Admitting: *Deleted

## 2022-04-05 ENCOUNTER — Emergency Department (HOSPITAL_COMMUNITY)
Admission: EM | Admit: 2022-04-05 | Discharge: 2022-04-05 | Discharge status: Against Medical Advice | Payer: Medicare HMO | Attending: Emergency Medicine | Admitting: Emergency Medicine

## 2022-04-05 ENCOUNTER — Emergency Department (HOSPITAL_COMMUNITY): Payer: Medicare HMO

## 2022-04-05 DIAGNOSIS — R4182 Altered mental status, unspecified: Secondary | ICD-10-CM | POA: Diagnosis not present

## 2022-04-05 DIAGNOSIS — R55 Syncope and collapse: Secondary | ICD-10-CM | POA: Diagnosis present

## 2022-04-05 DIAGNOSIS — Z5329 Procedure and treatment not carried out because of patient's decision for other reasons: Secondary | ICD-10-CM | POA: Diagnosis not present

## 2022-04-05 LAB — BASIC METABOLIC PANEL
Anion gap: 9 (ref 5–15)
BUN: 8 mg/dL (ref 8–23)
CO2: 27 mmol/L (ref 22–32)
Calcium: 9.3 mg/dL (ref 8.9–10.3)
Chloride: 106 mmol/L (ref 98–111)
Creatinine, Ser: 0.93 mg/dL (ref 0.61–1.24)
GFR, Estimated: >60 mL/min (ref >60)
Glucose, Bld: 106 mg/dL — ABNORMAL HIGH (ref 70–99)
Potassium: 3.3 mmol/L — ABNORMAL LOW (ref 3.5–5.1)
Sodium: 142 mmol/L (ref 135–145)

## 2022-04-05 LAB — CBC WITH DIFFERENTIAL/PLATELET
Abs Immature Granulocytes: 0.02 10*3/uL (ref 0.00–0.07)
Basophils Absolute: 0 10*3/uL (ref 0.0–0.1)
Basophils Relative: 0 %
Eosinophils Absolute: 0 10*3/uL (ref 0.0–0.5)
Eosinophils Relative: 0 %
HCT: 41.9 % (ref 39.0–52.0)
Hemoglobin: 13.6 g/dL (ref 13.0–17.0)
Immature Granulocytes: 0 %
Lymphocytes Relative: 16 %
Lymphs Abs: 1.1 10*3/uL (ref 0.7–4.0)
MCH: 28.5 pg (ref 26.0–34.0)
MCHC: 32.5 g/dL (ref 30.0–36.0)
MCV: 87.7 fL (ref 80.0–100.0)
Monocytes Absolute: 0.4 10*3/uL (ref 0.1–1.0)
Monocytes Relative: 5 %
Neutro Abs: 5.3 10*3/uL (ref 1.7–7.7)
Neutrophils Relative %: 79 %
Platelets: 175 10*3/uL (ref 150–400)
RBC: 4.78 MIL/uL (ref 4.22–5.81)
RDW: 13.4 % (ref 11.5–15.5)
WBC: 6.8 10*3/uL (ref 4.0–10.5)
nRBC: 0 % (ref 0.0–0.2)

## 2022-04-05 LAB — I-STAT CHEM 8, ED
BUN: 9 mg/dL (ref 8–23)
Calcium, Ion: 1.22 mmol/L (ref 1.15–1.40)
Chloride: 105 mmol/L (ref 98–111)
Creatinine, Ser: 0.9 mg/dL (ref 0.61–1.24)
Glucose, Bld: 102 mg/dL — ABNORMAL HIGH (ref 70–99)
HCT: 37 % — ABNORMAL LOW (ref 39.0–52.0)
Hemoglobin: 12.6 g/dL — ABNORMAL LOW (ref 13.0–17.0)
Potassium: 3.3 mmol/L — ABNORMAL LOW (ref 3.5–5.1)
Sodium: 142 mmol/L (ref 135–145)
TCO2: 28 mmol/L (ref 22–32)

## 2022-04-05 LAB — I-STAT VENOUS BLOOD GAS, ED
Acid-Base Excess: 3 mmol/L — ABNORMAL HIGH (ref 0.0–2.0)
Bicarbonate: 30.3 mmol/L — ABNORMAL HIGH (ref 20.0–28.0)
Calcium, Ion: 1.27 mmol/L (ref 1.15–1.40)
HCT: 41 % (ref 39.0–52.0)
Hemoglobin: 13.9 g/dL (ref 13.0–17.0)
O2 Saturation: 88 %
Potassium: 3.3 mmol/L — ABNORMAL LOW (ref 3.5–5.1)
Sodium: 146 mmol/L — ABNORMAL HIGH (ref 135–145)
TCO2: 32 mmol/L (ref 22–32)
pCO2, Ven: 58.4 mmHg (ref 44–60)
pH, Ven: 7.324 (ref 7.25–7.43)
pO2, Ven: 60 mmHg — ABNORMAL HIGH (ref 32–45)

## 2022-04-05 LAB — TROPONIN I (HIGH SENSITIVITY): Troponin I (High Sensitivity): 4 ng/L (ref <18)

## 2022-04-05 NOTE — ED Notes (Signed)
Wife requests call from patient or nurse.  Patient has his phone.  Wife's phone is [phone].

## 2022-04-05 NOTE — ED Notes (Signed)
[phone] [place] pt wife called for a update

## 2022-04-05 NOTE — ED Triage Notes (Signed)
Pt BIB EMS due to near LOC. Pt is supposed to be taking suboxone and takes oxycodone. Pt has occasional body jerk. Pupils reactive. Left sided weakness. Pt mumbled and is hard to wake up.  Pt had removal of disk and takes oxycodone for it. Axox4. VSS.

## 2022-04-05 NOTE — ED Provider Notes (Signed)
Scott [name] Note   CSN: [id number] Arrival date & time: 04/05/22  1712     History Chief Complaint  Patient presents with   Loss of Consciousness    HPI Scott [name] is a 62 y.o. male presenting for chief complaint of altered mental status episode. He is a chronically ill 62 year old male presenting with a chief complaint of altered mental status.  States that he is having confusion regarding his pain control plan per his pain [name].  States that he has had multiple overdoses over the last year. States that he was on a combination Subutex and Percocet combination.  However after the last overdose he stopped using the Subutex and is now try to control his pain with only Percocet.  States his pain was worse today than normal.  He has taken a total of 100 mg of oxycodone equivalents in the form of 10 Percocet tablets today. His prescription was for 180 tablets or 6 tablets/day He states he realized he had taken more than he meant to because he became lightheaded and had a near syncope event.  He administered his home Narcan prescription and called EMS went.  When they arrived, he was confused on the ground but still breathing.  He was started on oxygen therapy and brought in for further evaluation.  Spontaneous improvement on arrival here.  Back to mental status baseline though now he states he feels he has "precipitated withdrawal".    Patient's recorded medical, surgical, social, medication list and allergies were reviewed in the Snapshot window as part of the initial history.   Review of Systems   Review of Systems  Unable to perform ROS: Acuity of condition    Physical Exam Updated Vital Signs BP 118/78   Pulse 69   Resp 12   SpO2 97%  Physical Exam Vitals and nursing note reviewed.  Constitutional:      General: He is not in acute distress.    Appearance: He is well-developed.  HENT:     Head: Normocephalic and  atraumatic.  Eyes:     Conjunctiva/sclera: Conjunctivae normal.  Cardiovascular:     Rate and Rhythm: Normal rate and regular rhythm.     Heart sounds: No murmur heard. Pulmonary:     Effort: Pulmonary effort is normal. No respiratory distress.     Breath sounds: Normal breath sounds.  Abdominal:     Palpations: Abdomen is soft.     Tenderness: There is no abdominal tenderness.  Musculoskeletal:        General: No swelling.     Cervical back: Neck supple.  Skin:    General: Skin is warm and dry.     Capillary Refill: Capillary refill takes less than 2 seconds.  Neurological:     Mental Status: He is alert.  Psychiatric:        Mood and Affect: Mood normal.      ED Course/ Medical Decision Making/ A&P    Procedures Procedures   Medications Ordered in ED Medications - No data to display  Medical Decision Making:    Scott [name] is a 62 y.o. male who presented to the ED today with altered mental status detailed above.     Complete initial physical exam performed, notably the patient  was grossly confused GCS 14.  He was observed for 4 hours in the emergency room.  He started to have increasing awareness.  He endorsed the above history on repeat  evaluation.      Reviewed and confirmed nursing documentation for past medical history, family history, social history.    Initial Assessment:   Patient with an endorsed narcotic overdose.  Objective evaluation to evaluate for alternative etiology of his syncope or alternative etiology of his altered mental status was all ordered. He was observed very closely for his underlying etiology of his syncope.  Required frequent reassessments in the setting of overdose. Did not need readministration of Narcan.  I was informed that the patient eloped from the emergency department prior to completion of observation window.  I was not able to update the patient on his findings or results. Patient had mentioned that he was feeling better and  would likely leave from the emergency department during my final conversation with the patient.  No indication the patient requires IVC.  Patient welcome to return for completion of his workup.   Clinical Impression:  1. Syncope and collapse      Eloped   Final Clinical Impression(s) / ED Diagnoses Final diagnoses:  Syncope and collapse    Rx / DC Orders ED Discharge Orders     None         Scott Sciara, MD 04/05/22 2118

## 2022-04-06 ENCOUNTER — Other Ambulatory Visit: Payer: Self-pay | Admitting: Emergency Medicine

## 2022-04-08 ENCOUNTER — Encounter (HOSPITAL_COMMUNITY): Payer: Self-pay | Admitting: *Deleted

## 2022-04-08 ENCOUNTER — Encounter (HOSPITAL_COMMUNITY): Payer: Self-pay

## 2022-04-08 ENCOUNTER — Other Ambulatory Visit: Payer: Self-pay

## 2022-04-08 ENCOUNTER — Emergency Department (HOSPITAL_COMMUNITY)
Admission: EM | Admit: 2022-04-08 | Discharge: 2022-04-08 | Disposition: A | Discharge status: Home / Self Care | Payer: Medicare HMO | Attending: Emergency Medicine | Admitting: Emergency Medicine

## 2022-04-08 ENCOUNTER — Ambulatory Visit (HOSPITAL_COMMUNITY)
Admission: EM | Admit: 2022-04-08 | Discharge: 2022-04-08 | Disposition: A | Discharge status: Other Acute Inpatient Hospital | Payer: Medicare HMO

## 2022-04-08 DIAGNOSIS — F1193 Opioid use, unspecified with withdrawal: Secondary | ICD-10-CM

## 2022-04-08 DIAGNOSIS — Z7982 Long term (current) use of aspirin: Secondary | ICD-10-CM | POA: Insufficient documentation

## 2022-04-08 DIAGNOSIS — G894 Chronic pain syndrome: Secondary | ICD-10-CM

## 2022-04-08 DIAGNOSIS — M542 Cervicalgia: Secondary | ICD-10-CM | POA: Diagnosis not present

## 2022-04-08 MED ORDER — OXYCODONE HCL 10 MG PO TABS: 10.0000 mg | ORAL_TABLET | ORAL | 0 refills | Status: DC | PRN

## 2022-04-08 NOTE — ED Provider Notes (Signed)
[facility] Provider Note   CSN: [id number] Arrival date & time: 04/08/22  1038     History  Chief Complaint  Patient presents with   Pain    Scott [name] is a 62 y.o. male.  62 yo M with a chief complaints of issues with his chronic medications.  He tells me that he was on a combination of Suboxone and Percocets and when he would take his medication he would have ringing in his ears and tremors in bilateral hands.  This was presumed to be from his Suboxone and so that medication was stopped and he was told to increase his Percocets at home.  He had a unintentional overdose that brought him to the emergency department and has been struggling to keep his pain controlled.  He also started having similar symptoms he was having before stopping the Suboxone.  Every time he takes a Percocet immediately thereafter will have ringing in his ears and tremors to bilateral hands.  He is not sure what to do.  He went to urgent care and was encouraged to come here for evaluation.        Home Medications Prior to Admission medications   Medication Sig Start Date End Date Taking? Authorizing Provider  oxyCODONE 10 MG TABS Take 1 tablet (10 mg total) by mouth every 4 (four) hours as needed for severe pain. 04/08/22  Yes Deno Etienne, DO  aspirin EC 81 MG tablet Take 81 mg by mouth daily.    [provider]  bisoprolol (ZEBETA) 5 MG tablet Take 1 tablet (5 mg total) by mouth daily. 09/13/21   Lenna Sciara, NP  diazepam (VALIUM) 5 MG tablet Take 5 mg by mouth 2 (two) times daily as needed for anxiety. 08/07/18   [provider]  fluticasone (FLONASE) 50 MCG/ACT nasal spray USE 2 SPRAYS NASALLY EVERY DAY 05/25/21   Collene Gobble, MD  furosemide (LASIX) 20 MG tablet Take 1 tablet (20 mg total) by mouth every other day. 09/13/21   Lenna Sciara, NP  gabapentin (NEURONTIN) 300 MG capsule TAKE 1 CAPSULE BY MOUTH 3 TIMES DAILY 11/21/18   Jacelyn Pi, Lilia Argue, MD  Ipratropium-Albuterol (COMBIVENT RESPIMAT) 20-100 MCG/ACT AERS respimat INHALE 1 PUFF FOUR TIMES DAILY 07/08/21   Collene Gobble, MD  ipratropium-albuterol (DUONEB) 0.5-2.5 (3) MG/3ML SOLN Inhale 1 vial via nebulizer every 6 hours as needed 07/08/21   Collene Gobble, MD  JANUMET 50-500 MG tablet TAKE 1 TABLET BY MOUTH ONCE A DAY 07/31/18   Jacelyn Pi, Lilia Argue, MD  [facility] 4 MG/0.1ML LIQD nasal spray kit Place 1 spray into the nose daily as needed (For overdose).  06/13/18   [provider]  omeprazole (PRILOSEC) 40 MG capsule TAKE 1 CAPSULE BY MOUTH EVERY DAY 01/08/19   Jacelyn Pi, Lilia Argue, MD  oxyCODONE-acetaminophen (PERCOCET) 10-325 MG tablet Take 1 tablet by mouth every 4 (four) hours. 05/28/18   Oretha Milch D, MD  rosuvastatin (CRESTOR) 10 MG tablet Take 1 tablet (10 mg total) by mouth daily. 09/13/21   Lenna Sciara, NP  sacubitril-valsartan (ENTRESTO) 97-103 MG Take 1 tablet by mouth 2 (two) times daily. 03/20/22   Minus Breeding, MD  VENTOLIN HFA 108 (90 Base) MCG/ACT inhaler INHALE 1 TO 2 PUFFS BY MOUTH EVERY 6 HOURS AS NEEDED FOR WHEEZING OR SHORTNESS OF BREATH 02/18/22   Chesley Mires, MD  Vitamin D, Ergocalciferol, (DRISDOL) 1.25 MG (50000 UT) CAPS  capsule TAKE ONE CAPSULE BY MOUTH ONCE WEEKLY 12/18/18   Jacelyn Pi, Lilia Argue, MD      Allergies    Penicillins and Cymbalta [duloxetine hcl]    Review of Systems   Review of Systems  Physical Exam Updated Vital Signs BP 138/78   Pulse 77   Temp 98.2 F (36.8 C) (Oral)   Resp (!) 22   SpO2 97%  Physical Exam Vitals and nursing note reviewed.  Constitutional:      Appearance: He is well-developed.  HENT:     Head: Normocephalic and atraumatic.  Eyes:     Pupils: Pupils are equal, round, and reactive to light.  Neck:     Vascular: No JVD.  Cardiovascular:     Rate and Rhythm: Normal rate and regular rhythm.     Heart sounds: No murmur heard.    No friction rub. No gallop.  Pulmonary:      Effort: No respiratory distress.     Breath sounds: No wheezing.  Abdominal:     General: There is no distension.     Tenderness: There is no abdominal tenderness. There is no guarding or rebound.  Musculoskeletal:        General: Normal range of motion.     Cervical back: Normal range of motion and neck supple.     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  Skin:    Coloration: Skin is not pale.     Findings: No rash.  Neurological:     Mental Status: He is alert and oriented to person, place, and time.  Psychiatric:        Behavior: Behavior normal.     ED Results / Procedures / Treatments   Labs (all labs ordered are listed, but only abnormal results are displayed) Labs Reviewed - No data to display  EKG None  Radiology No results found.  Procedures Procedures    Medications Ordered in ED Medications - No data to display  ED Course/ Medical Decision Making/ A&P                             Medical Decision Making Risk Prescription drug management.   62 yo M with chronic left-sided neck pain.  He has been struggling to keep this controlled.  He was on Percocet and Suboxone therapy and had been weaned off of Suboxone due to presumed side effects from it.  It seems that he still having the side effects when he takes narcotics.  He is not sure what to do.  He is not clinically in withdrawal.  No piloerection no hypertension no diaphoresis no nausea or vomiting.  He had a couple loose stools at home.  I discussed different possible pathways.  Eventually with shared decision making we elected to try to switch his Percocet to oxycodone and have him max his over-the-counter Tylenol dosing.  I will prescribe him just enough to make it to Monday morning to call his pain management physician to try and readjust his medications.  11:24 AM:  I have discussed the diagnosis/risks/treatment options with the patient.  Evaluation and diagnostic testing in the  emergency department does not suggest an emergent condition requiring admission or immediate intervention beyond what has been performed at this time.  They will follow up with PCP. We also discussed returning to the ED immediately if new or worsening sx occur. We discussed the sx which are most  concerning (e.g., sudden worsening pain, fever, inability to tolerate by mouth) that necessitate immediate return. Medications administered to the patient during their visit and any new prescriptions provided to the patient are listed below.  Medications given during this visit Medications - No data to display   The patient appears reasonably screen and/or stabilized for discharge and I doubt any other medical condition or other [facility] requiring further screening, evaluation, or treatment in the ED at this time prior to discharge.          Final Clinical Impression(s) / ED Diagnoses Final diagnoses:  Chronic pain syndrome    Rx / DC Orders ED Discharge Orders          Ordered    oxyCODONE 10 MG TABS  Every 4 hours PRN        04/08/22 [place], [place], DO 04/08/22 1124

## 2022-04-08 NOTE — ED Triage Notes (Signed)
Pt taking percocet and suboxone for chronic neck pain, pt was told by his PCP and cardiologist to stop taking the suboxone due to it causing him to have tremors, headaches and ringing in his ears. Pt stopped taking it on 2/17, followed up with his pain management doctor on 3/4 and was told to increase his percocet dose. Pt did so and then overdosed and ended up in the hospital. Pt states the percocet is not helping for his pain so he took a half a tablet of the suboxone this morning to see if it helped with the pain and he started having tremors and headaches again.

## 2022-04-08 NOTE — ED Provider Notes (Signed)
Vitals and triage reviewed, patient is alert and oriented. Reports he is withdrawing and needs help. He took Suboxone as well as his oxycodone/acetaminophen, today he feels as if his ears are ringing, his head is pounding and he is shaking. Advised to head to the nearest ED for further care and evaluation. Patient agreeable to plan, declined ambulance transport. He is on portable oxygen.    Scott [name], [place], [place] 04/08/22 1020

## 2022-04-08 NOTE — ED Triage Notes (Signed)
Provider at bed side to eval Pt. Pt referred to ED for with drawal sx's.

## 2022-04-08 NOTE — ED Triage Notes (Addendum)
PT currently takes  oxycodone/acetaminophen 10/325mg . Pt also taking Suboxone 8mg /2 mg Sl. Pt has been advised to stop Suboxone but did continue taking . Pt used three soboxone today. Pt reports he is with drawing Soboxone and it is playing with his nerves. Pt seen in ED for OD. Pt REPORTS he has a HA and shaking. Pt does have a 4PM appt at pain clinic today.

## 2022-04-08 NOTE — Discharge Instructions (Addendum)
Below is the max Tylenol dosing.  It is recommended that you try to max out your Tylenol with your pain medicine.  That is part of the reason why it is included with your pain medicine at home. Take tylenol 1000mg (2 extra strength) four times a day.   Please call your pain management physician first thing on Monday so they can try and help you find a new regiment to help you at home.

## 2022-04-10 ENCOUNTER — Inpatient Hospital Stay: Admission: RE | Admit: 2022-04-10 | Payer: Medicare HMO | Source: Ambulatory Visit

## 2022-04-10 ENCOUNTER — Ambulatory Visit
Admission: RE | Admit: 2022-04-10 | Discharge: 2022-04-10 | Disposition: A | Discharge status: Home / Self Care | Payer: Medicare HMO | Source: Ambulatory Visit | Attending: Acute Care | Admitting: Acute Care

## 2022-04-10 DIAGNOSIS — Z87891 Personal history of nicotine dependence: Secondary | ICD-10-CM

## 2022-04-12 ENCOUNTER — Other Ambulatory Visit: Payer: Self-pay

## 2022-04-12 DIAGNOSIS — Z87891 Personal history of nicotine dependence: Secondary | ICD-10-CM

## 2022-04-14 ENCOUNTER — Ambulatory Visit (INDEPENDENT_AMBULATORY_CARE_PROVIDER_SITE_OTHER): Payer: Medicare HMO | Admitting: Emergency Medicine

## 2022-04-14 ENCOUNTER — Other Ambulatory Visit: Payer: Self-pay | Admitting: Emergency Medicine

## 2022-04-14 ENCOUNTER — Encounter: Payer: Self-pay | Admitting: Emergency Medicine

## 2022-04-14 VITALS — BP 134/80 | HR 71 | Temp 98.4°F | Ht 68.0 in | Wt 185.6 lb

## 2022-04-14 DIAGNOSIS — F17200 Nicotine dependence, unspecified, uncomplicated: Secondary | ICD-10-CM

## 2022-04-14 DIAGNOSIS — J9611 Chronic respiratory failure with hypoxia: Secondary | ICD-10-CM | POA: Diagnosis not present

## 2022-04-14 DIAGNOSIS — J439 Emphysema, unspecified: Secondary | ICD-10-CM

## 2022-04-14 DIAGNOSIS — J449 Chronic obstructive pulmonary disease, unspecified: Secondary | ICD-10-CM

## 2022-04-14 NOTE — Assessment & Plan Note (Signed)
Uses oxygen 3 L/min with heavier exertion.  Typically able to be on room air at rest and with light exertion.

## 2022-04-14 NOTE — Progress Notes (Signed)
Subjective:    Patient ID: [name], male    DOB: 05/09/1960, 62 y.o.   MRN: [id number]  HPI  ROV 11/15/2021 --Mr. Priess is 29, with a history of tobacco use, severe obstructive lung disease and associated hypoxemic respiratory failure.  I last saw him in May.  Today he reports that he has been feeling pretty good. Occasional cough, has phlegm in his throat. No flares since last visit. He quit smoking 11/7 He has been managed on Advair twice daily but he stopped it, uses either Combivent or DuoNeb 4 times daily on a schedule.  He has albuterol which he uses rarely.  Fluticasone nasal spray, omeprazole Oxygen at 3-4L/min, no desats noted. He is due for walking oximetry.  Due for repeat lung cancer screening CT in March 2024 Flu shot up to date, has not had the most recent COVID.   ROV 04/14/2022 --follow-up visit for 62 year old gentleman with history of tobacco use and associated severe COPD.  He has chronic hypoxemic respiratory failure, cough, mucus production.  Also with chronic allergic rhinitis.  He participates in lung cancer screening program, most recent CT 04/10/2022 as below.  Currently managed on Combivent 4 times a day on schedule, DuoNeb as needed, fluticasone nasal spray He had a tobacco relapse for about a month from Feb to March. He has been in the ED several times this year for issues with his pain control, chest pain. He reports that his breathing has done fairly well despite the smoking relapse. He has stopped for the last week, back on nicotine patches, also using 4mg  gum w success. No COPD flares. Flu shot up to date.   CT scan of the chest 04/10/2022 reviewed by me, shows moderate centrilobular and paraseptal emphysema, scattered pulmonary nodules that are unchanged, largest 3.4 mm.  Lung RADS 2 study.        No data to display          Objective:   Physical Exam  Vitals:   04/14/22 0934  BP: 134/80  Pulse: 71  Temp: 98.4 F (36.9 C)  TempSrc: Oral  SpO2:  92%  Weight: 185 lb 9.6 oz (84.2 kg)  Height: 5\' 8"  (1.727 m)    Gen: Pleasant, well-nourished, in no distress  ENT: No lesions,  mouth clear,  oropharynx clear, no postnasal drip,   Neck: No JVD, no stridor  Lungs: mostly clear, some inspiratory wheeze, bilateral end expiratory wheeze  Cardiovascular: RRR, heart sounds normal, no murmur or gallops, trace peripheral edema  Musculoskeletal: No deformities, no cyanosis or clubbing  Neuro: alert, appropriate, non-focal  Skin: Warm     Assessment & Plan:  COPD (chronic obstructive pulmonary disease) Please continue Combivent 2 puffs 4 times a day. Use your DuoNeb up to 4 times a day when needed for shortness of breath, chest tightness, wheezing.  You can substitute this for the Combivent when you are at home. Follow Dr. Lamonte Sakai in 6 months or sooner if you have any problems.  Chronic respiratory failure with hypoxia (HCC) Uses oxygen 3 L/min with heavier exertion.  Typically able to be on room air at rest and with light exertion.  Tobacco use disorder He had a relapse for about 1 month between February and March.  He has now stopped.  Using nicotine replacement with good benefit.  Encouraged him to continue his abstinence. Stable RADS 2 CT chest 04/10/2022.  Needs a repeat in March/2025.       Baltazar Apo, MD, PhD 04/14/2022, 9:56  AM [facility] [phone] or if no answer [phone]

## 2022-04-14 NOTE — Assessment & Plan Note (Signed)
Please continue Combivent 2 puffs 4 times a day. Use your DuoNeb up to 4 times a day when needed for shortness of breath, chest tightness, wheezing.  You can substitute this for the Combivent when you are at home. Follow Dr. Lamonte Sakai in 6 months or sooner if you have any problems.

## 2022-04-14 NOTE — Assessment & Plan Note (Signed)
He had a relapse for about 1 month between February and March.  He has now stopped.  Using nicotine replacement with good benefit.  Encouraged him to continue his abstinence. Stable RADS 2 CT chest 04/10/2022.  Needs a repeat in March/2025.

## 2022-04-14 NOTE — Patient Instructions (Signed)
Congratulations on stopping smoking again.  Work hard to avoid restarting Please continue Combivent 2 puffs 4 times a day. Use your DuoNeb up to 4 times a day when needed for shortness of breath, chest tightness, wheezing.  You can substitute this for the Combivent when you are at home. Continue your oxygen at 3 L/min when you exert yourself We reviewed your CT scan of the chest today.  You will need a repeat lung cancer screening CT in March 2025. Follow Dr. Lamonte Sakai in 6 months or sooner if you have any problems.

## 2022-04-22 ENCOUNTER — Encounter (HOSPITAL_COMMUNITY): Payer: Self-pay

## 2022-04-22 ENCOUNTER — Other Ambulatory Visit: Payer: Self-pay

## 2022-04-22 ENCOUNTER — Emergency Department (HOSPITAL_COMMUNITY)
Admission: EM | Admit: 2022-04-22 | Discharge: 2022-04-22 | Disposition: A | Discharge status: Home / Self Care | Payer: Medicare HMO | Attending: Emergency Medicine | Admitting: Emergency Medicine

## 2022-04-22 ENCOUNTER — Emergency Department (HOSPITAL_COMMUNITY): Payer: Medicare HMO

## 2022-04-22 DIAGNOSIS — R079 Chest pain, unspecified: Secondary | ICD-10-CM

## 2022-04-22 DIAGNOSIS — E119 Type 2 diabetes mellitus without complications: Secondary | ICD-10-CM | POA: Diagnosis not present

## 2022-04-22 DIAGNOSIS — J449 Chronic obstructive pulmonary disease, unspecified: Secondary | ICD-10-CM | POA: Insufficient documentation

## 2022-04-22 DIAGNOSIS — Z7982 Long term (current) use of aspirin: Secondary | ICD-10-CM | POA: Diagnosis not present

## 2022-04-22 DIAGNOSIS — R0789 Other chest pain: Secondary | ICD-10-CM | POA: Insufficient documentation

## 2022-04-22 LAB — BASIC METABOLIC PANEL
Anion gap: 12 (ref 5–15)
BUN: 8 mg/dL (ref 8–23)
CO2: 25 mmol/L (ref 22–32)
Calcium: 9.4 mg/dL (ref 8.9–10.3)
Chloride: 103 mmol/L (ref 98–111)
Creatinine, Ser: 0.77 mg/dL (ref 0.61–1.24)
GFR, Estimated: >60 mL/min (ref >60)
Glucose, Bld: 123 mg/dL — ABNORMAL HIGH (ref 70–99)
Potassium: 4.1 mmol/L (ref 3.5–5.1)
Sodium: 140 mmol/L (ref 135–145)

## 2022-04-22 LAB — CBC
HCT: 43.5 % (ref 39.0–52.0)
Hemoglobin: 14.5 g/dL (ref 13.0–17.0)
MCH: 28.8 pg (ref 26.0–34.0)
MCHC: 33.3 g/dL (ref 30.0–36.0)
MCV: 86.3 fL (ref 80.0–100.0)
Platelets: 250 10*3/uL (ref 150–400)
RBC: 5.04 MIL/uL (ref 4.22–5.81)
RDW: 13.4 % (ref 11.5–15.5)
WBC: 7.7 10*3/uL (ref 4.0–10.5)
nRBC: 0 % (ref 0.0–0.2)

## 2022-04-22 LAB — TROPONIN I (HIGH SENSITIVITY): Troponin I (High Sensitivity): 5 ng/L (ref <18)

## 2022-04-22 MED ORDER — OXYCODONE-ACETAMINOPHEN 5-325 MG PO TABS
1.0000 | ORAL_TABLET | Freq: Once | ORAL | Status: AC
  Administered 2022-04-22: 1 via ORAL
  Filled 2022-04-22: qty 1

## 2022-04-22 NOTE — ED Provider Notes (Signed)
[facility] Provider Note   CSN: [id number] Arrival date & time: 04/22/22  1048     History  Chief Complaint  Patient presents with   Chest Pain    Scott [name] is a 62 y.o. male.  Patient here for some generalized discomfort, may be chest discomfort.  But he is mostly does not think that his chronic narcotic pain medicine is helping much with his chronic pain.  He is somewhat in between providers at this time but he has active narcotic prescription.  He has a new doctor to follow-up with here in the next few days to reestablish his chronic pain plan.  He is not having any shortness of breath or nausea vomiting diarrhea.  He is not having any active chest pain now.  He is mostly concerned that his chronic pain management as not being well-controlled.  Used to be on Suboxone with his chronic narcotics but did not like how that made him feel.  The history is provided by the patient.       Home Medications Prior to Admission medications   Medication Sig Start Date End Date Taking? Authorizing Provider  albuterol (VENTOLIN HFA) 108 (90 Base) MCG/ACT inhaler INHALE 1 TO 2 PUFFS BY MOUTH EVERY 6 HOURS AS NEEDED FOR WHEEZING OR SHORTNESS OF BREATH 04/14/22   Collene Gobble, MD  aspirin EC 81 MG tablet Take 81 mg by mouth daily.    [provider]  bisoprolol (ZEBETA) 5 MG tablet Take 1 tablet (5 mg total) by mouth daily. 09/13/21   Lenna Sciara, NP  diazepam (VALIUM) 5 MG tablet Take 5 mg by mouth 2 (two) times daily as needed for anxiety. 08/07/18   [provider]  fluticasone (FLONASE) 50 MCG/ACT nasal spray USE 2 SPRAYS NASALLY EVERY DAY 05/25/21   Collene Gobble, MD  furosemide (LASIX) 20 MG tablet Take 1 tablet (20 mg total) by mouth every other day. 09/13/21   Lenna Sciara, NP  gabapentin (NEURONTIN) 300 MG capsule TAKE 1 CAPSULE BY MOUTH 3 TIMES DAILY 11/21/18   Jacelyn Pi, Lilia Argue, MD  Ipratropium-Albuterol  (COMBIVENT RESPIMAT) 20-100 MCG/ACT AERS respimat INHALE 1 PUFF BY MOUTH FOUR TIMES DAILY 04/10/22   Collene Gobble, MD  ipratropium-albuterol (DUONEB) 0.5-2.5 (3) MG/3ML SOLN Inhale 1 vial via nebulizer every 6 hours as needed 07/08/21   Collene Gobble, MD  JANUMET 50-500 MG tablet TAKE 1 TABLET BY MOUTH ONCE A DAY 07/31/18   Jacelyn Pi, Lilia Argue, MD  [facility] 4 MG/0.1ML LIQD nasal spray kit Place 1 spray into the nose daily as needed (For overdose).  06/13/18   [provider]  omeprazole (PRILOSEC) 40 MG capsule TAKE 1 CAPSULE BY MOUTH EVERY DAY 01/08/19   Jacelyn Pi, Lilia Argue, MD  oxyCODONE 10 MG TABS Take 1 tablet (10 mg total) by mouth every 4 (four) hours as needed for severe pain. 04/08/22   Deno Etienne, DO  oxyCODONE-acetaminophen (PERCOCET) 10-325 MG tablet Take 1 tablet by mouth every 4 (four) hours. 05/28/18   Oretha Milch D, MD  rosuvastatin (CRESTOR) 10 MG tablet Take 1 tablet (10 mg total) by mouth daily. 09/13/21   Lenna Sciara, NP  sacubitril-valsartan (ENTRESTO) 97-103 MG Take 1 tablet by mouth 2 (two) times daily. 03/20/22   Minus Breeding, MD  Vitamin D, Ergocalciferol, (DRISDOL) 1.25 MG (50000 UT) CAPS capsule TAKE ONE CAPSULE BY MOUTH ONCE WEEKLY 12/18/18   Jacelyn Pi,  Lilia Argue, MD      Allergies    Penicillins and Cymbalta [duloxetine hcl]    Review of Systems   Review of Systems  Physical Exam Updated Vital Signs BP (!) 139/111 (BP Location: Left Arm)   Pulse 77   Temp 97.9 F (36.6 C)   Resp (!) 22   Ht 5\' 8"  (1.727 m)   Wt 81.6 kg   SpO2 94%   BMI 27.37 kg/m  Physical Exam Vitals and nursing note reviewed.  Constitutional:      General: He is not in acute distress.    Appearance: He is well-developed.  HENT:     Head: Normocephalic and atraumatic.  Eyes:     Conjunctiva/sclera: Conjunctivae normal.     Pupils: Pupils are equal, round, and reactive to light.  Cardiovascular:     Rate and Rhythm: Normal rate and regular rhythm.     Pulses:           Radial pulses are 2+ on the right side and 2+ on the left side.     Heart sounds: Normal heart sounds. No murmur heard. Pulmonary:     Effort: Pulmonary effort is normal. No respiratory distress.     Breath sounds: Normal breath sounds.  Abdominal:     Palpations: Abdomen is soft.     Tenderness: There is no abdominal tenderness.  Musculoskeletal:        General: No swelling. Normal range of motion.     Cervical back: Normal range of motion and neck supple.     Right lower leg: No edema.     Left lower leg: No edema.  Skin:    General: Skin is warm and dry.     Capillary Refill: Capillary refill takes less than 2 seconds.  Neurological:     Mental Status: He is alert.  Psychiatric:        Mood and Affect: Mood normal.     ED Results / Procedures / Treatments   Labs (all labs ordered are listed, but only abnormal results are displayed) Labs Reviewed  BASIC METABOLIC PANEL - Abnormal; Notable for the following components:      Result Value   Glucose, Bld 123 (*)    All other components within normal limits  CBC  TROPONIN I (HIGH SENSITIVITY)    EKG EKG Interpretation  Date/Time:  Saturday April 22 2022 10:58:51 EDT Ventricular Rate:  74 PR Interval:  202 QRS Duration: 92 QT Interval:  400 QTC Calculation: 444 R Axis:   68 Text Interpretation: Normal sinus rhythm Nonspecific T wave abnormality Abnormal ECG When compared with ECG of 05-Apr-2022 19:13, PREVIOUS ECG IS PRESENT Confirmed by Lennice Sites ([id number]) on 04/22/2022 11:28:36 AM  Radiology DG Chest 2 View  Result Date: 04/22/2022 CLINICAL DATA:  chest pain EXAM: CHEST - 2 VIEW COMPARISON:  04/05/2022 FINDINGS: Lungs are clear, hyperinflated. Heart size and mediastinal contours are within normal limits. Aortic Atherosclerosis (ICD10-170.0). No effusion. Cervical fixation hardware stable. IMPRESSION: No acute cardiopulmonary disease. Electronically Signed   By: Lucrezia Europe M.D.   On: 04/22/2022 11:22     Procedures Procedures    Medications Ordered in ED Medications  oxyCODONE-acetaminophen (PERCOCET/ROXICET) 5-325 MG per tablet 1 tablet (has no administration in time range)    ED Course/ Medical Decision Making/ A&P                             Medical Decision  Making Amount and/or Complexity of Data Reviewed Labs: ordered. Radiology: ordered.  Risk Prescription drug management.   Scott [name] is here with chest pain.  Normal vitals.  No fever.  History of COPD, acid reflux, diabetes, chronic pain.  When I talk with him most of his issues are with his chronic pain medication.  He does not think his chronic narcotic is helping and tolerate his pain as well as it used to.  He used to be on Suboxone with this medicine but did not like how that made him feel.  He has to see a new pain provider now next week.  He has active narcotic prescription.  With appropriate amount of the tablets to get him to his next visit.  I will give him an extra dose of Percocet while he is here but ultimately he does not have any cardiac sounding chest pain.  Have no concern for ACS or PE or pneumonia or electrolyte abnormality but basic labs have already been sent including troponin and EKG.  Per my review and interpretation there is no significant anemia or electrolyte abnormality or kidney injury.  Troponin is normal.  EKG shows sinus rhythm.  Chest x-ray with no evidence of pneumonia or pneumothorax.  Overall discharged in good condition.  Understands return precautions.  This chart was dictated using voice recognition software.  Despite best efforts to proofread,  errors can occur which can change the documentation meaning.         Final Clinical Impression(s) / ED Diagnoses Final diagnoses:  Nonspecific chest pain    Rx / DC Orders ED Discharge Orders     None         Lennice Sites, DO 04/22/22 1218

## 2022-04-22 NOTE — ED Triage Notes (Signed)
Patient here for evaluation of chest tightness that started this morning. Patient states he thinks it is related to his oxycodone tablets. He is prescribed 15mg  tablets five times per day, patient states he does not believe there is an opioid in the tablets and that is why he feels this way. Patient is alert, oriented, and in no apparent distress at this time.

## 2022-04-22 NOTE — ED Notes (Signed)
Pt has multiple complaints at this time. Pt c/o chest tightness, jitteriness, shaking, and pain in his neck. States he is withdrawing from his oxycodone because "there are no opioids in these pills."

## 2022-04-30 ENCOUNTER — Other Ambulatory Visit: Payer: Self-pay

## 2022-04-30 ENCOUNTER — Emergency Department (HOSPITAL_COMMUNITY)
Admission: EM | Admit: 2022-04-30 | Discharge: 2022-04-30 | Disposition: A | Discharge status: Home / Self Care | Payer: Medicare HMO | Attending: Emergency Medicine | Admitting: Emergency Medicine

## 2022-04-30 DIAGNOSIS — R630 Anorexia: Secondary | ICD-10-CM | POA: Insufficient documentation

## 2022-04-30 DIAGNOSIS — R451 Restlessness and agitation: Secondary | ICD-10-CM | POA: Insufficient documentation

## 2022-04-30 DIAGNOSIS — Z1152 Encounter for screening for COVID-19: Secondary | ICD-10-CM | POA: Insufficient documentation

## 2022-04-30 DIAGNOSIS — R430 Anosmia: Secondary | ICD-10-CM | POA: Diagnosis present

## 2022-04-30 DIAGNOSIS — R109 Unspecified abdominal pain: Secondary | ICD-10-CM | POA: Diagnosis not present

## 2022-04-30 DIAGNOSIS — R6883 Chills (without fever): Secondary | ICD-10-CM | POA: Diagnosis not present

## 2022-04-30 DIAGNOSIS — I509 Heart failure, unspecified: Secondary | ICD-10-CM | POA: Insufficient documentation

## 2022-04-30 DIAGNOSIS — N189 Chronic kidney disease, unspecified: Secondary | ICD-10-CM | POA: Insufficient documentation

## 2022-04-30 DIAGNOSIS — J449 Chronic obstructive pulmonary disease, unspecified: Secondary | ICD-10-CM | POA: Diagnosis not present

## 2022-04-30 LAB — RESP PANEL BY RT-PCR (RSV, FLU A&B, COVID)  RVPGX2
Influenza A by PCR: NEGATIVE
Influenza B by PCR: NEGATIVE
Resp Syncytial Virus by PCR: NEGATIVE
SARS Coronavirus 2 by RT PCR: NEGATIVE

## 2022-04-30 MED ORDER — OXYCODONE-ACETAMINOPHEN 5-325 MG PO TABS
1.0000 | ORAL_TABLET | Freq: Once | ORAL | Status: AC
  Administered 2022-04-30: 1 via ORAL
  Filled 2022-04-30: qty 1

## 2022-04-30 NOTE — ED Provider Notes (Signed)
[place] EMERGENCY DEPARTMENT AT [facility] Provider Note   CSN: [id number] Arrival date & time: 04/30/22  1159     History  Chief Complaint  Patient presents with   Weight Loss    Scott [name] is a 62 y.o. male.  With past medical history of CHF, COPD, CKD, and chronic pain who presents to the emergency department complaining of anosmia, loss of appetite, chills, and fatigue for the past 3 weeks. Symptoms began once patient started taking new pain medicine. Patient was taking Percocet 5-325, but was then told to start taking Oxycodone 15mg  5x daily. He states that he took Oxycodone 15mg  5x daily for 3 days, but it was too strong of a dose for him. Patient reports changing his dose and combining his old percocet prescription with his current oxycodone prescription multiple times over the past 3 weeks. Endorses history of anxiety, denies recent alcohol or other drug use. Denies headache, hallucinations, seizures, vision changes, chest pain, palpitations.        Home Medications Prior to Admission medications   Medication Sig Start Date End Date Taking? Authorizing Provider  albuterol (VENTOLIN HFA) 108 (90 Base) MCG/ACT inhaler INHALE 1 TO 2 PUFFS BY MOUTH EVERY 6 HOURS AS NEEDED FOR WHEEZING OR SHORTNESS OF BREATH 04/14/22   Leslye Peer, MD  aspirin EC 81 MG tablet Take 81 mg by mouth daily.    [provider]  bisoprolol (ZEBETA) 5 MG tablet Take 1 tablet (5 mg total) by mouth daily. 09/13/21   Joylene Grapes, NP  diazepam (VALIUM) 5 MG tablet Take 5 mg by mouth 2 (two) times daily as needed for anxiety. 08/07/18   [provider]  fluticasone (FLONASE) 50 MCG/ACT nasal spray USE 2 SPRAYS NASALLY EVERY DAY 05/25/21   Leslye Peer, MD  furosemide (LASIX) 20 MG tablet Take 1 tablet (20 mg total) by mouth every other day. 09/13/21   Joylene Grapes, NP  gabapentin (NEURONTIN) 300 MG capsule TAKE 1 CAPSULE BY MOUTH 3 TIMES DAILY 11/21/18   Lezlie Lye, Meda Coffee, MD  Ipratropium-Albuterol (COMBIVENT RESPIMAT) 20-100 MCG/ACT AERS respimat INHALE 1 PUFF BY MOUTH FOUR TIMES DAILY 04/10/22   Leslye Peer, MD  ipratropium-albuterol (DUONEB) 0.5-2.5 (3) MG/3ML SOLN Inhale 1 vial via nebulizer every 6 hours as needed 07/08/21   Leslye Peer, MD  JANUMET 50-500 MG tablet TAKE 1 TABLET BY MOUTH ONCE A DAY 07/31/18   Lezlie Lye, Meda Coffee, MD  [facility] 4 MG/0.1ML LIQD nasal spray kit Place 1 spray into the nose daily as needed (For overdose).  06/13/18   [provider]  omeprazole (PRILOSEC) 40 MG capsule TAKE 1 CAPSULE BY MOUTH EVERY DAY 01/08/19   Lezlie Lye, Meda Coffee, MD  oxyCODONE 10 MG TABS Take 1 tablet (10 mg total) by mouth every 4 (four) hours as needed for severe pain. 04/08/22   Melene Plan, DO  oxyCODONE-acetaminophen (PERCOCET) 10-325 MG tablet Take 1 tablet by mouth every 4 (four) hours. 05/28/18   Roberto Scales D, MD  rosuvastatin (CRESTOR) 10 MG tablet Take 1 tablet (10 mg total) by mouth daily. 09/13/21   Joylene Grapes, NP  sacubitril-valsartan (ENTRESTO) 97-103 MG Take 1 tablet by mouth 2 (two) times daily. 03/20/22   Rollene Rotunda, MD  Vitamin D, Ergocalciferol, (DRISDOL) 1.25 MG (50000 UT) CAPS capsule TAKE ONE CAPSULE BY MOUTH ONCE WEEKLY 12/18/18   Lezlie Lye, Meda Coffee, MD      Allergies  Penicillins and Cymbalta [duloxetine hcl]    Review of Systems   Review of Systems  Constitutional:  Positive for appetite change, chills and fatigue. Negative for fever.  Eyes:  Negative for visual disturbance.  Respiratory:  Negative for cough.   Cardiovascular:  Negative for chest pain and palpitations.  Gastrointestinal:  Positive for abdominal pain. Negative for blood in stool, constipation, diarrhea, nausea and vomiting.  Genitourinary:  Negative for dysuria and hematuria.  Neurological:  Negative for dizziness, seizures, syncope, light-headedness and headaches.  Psychiatric/Behavioral:  Positive for agitation. Negative for  hallucinations.     Physical Exam Updated Vital Signs BP (!) 147/107 (BP Location: Right Arm)   Pulse 81   Temp 98.3 F (36.8 C) (Oral)   Resp (!) 22   SpO2 95%  Physical Exam Vitals and nursing note reviewed.  Constitutional:      General: He is not in acute distress.    Appearance: Normal appearance. He is normal weight. He is not ill-appearing, toxic-appearing or diaphoretic.  HENT:     Head: Normocephalic and atraumatic.  Eyes:     Extraocular Movements: Extraocular movements intact.     Conjunctiva/sclera: Conjunctivae normal.     Pupils: Pupils are equal, round, and reactive to light.  Cardiovascular:     Rate and Rhythm: Normal rate and regular rhythm.  Pulmonary:     Effort: Pulmonary effort is normal.     Breath sounds: Normal breath sounds.  Abdominal:     General: Abdomen is flat. Bowel sounds are normal. There is no distension.     Palpations: Abdomen is soft.     Tenderness: There is no abdominal tenderness.  Musculoskeletal:     Comments: Some mild shaking while holding arms out in extension.  Skin:    General: Skin is warm and dry.  Neurological:     General: No focal deficit present.     Mental Status: He is alert and oriented to person, place, and time.  Psychiatric:     Comments: Patient appears anxious     ED Results / Procedures / Treatments   Labs (all labs ordered are listed, but only abnormal results are displayed) Labs Reviewed  RESP PANEL BY RT-PCR (RSV, FLU A&B, COVID)  [id number]    EKG None  Radiology No results found.  Procedures Procedures    Medications Ordered in ED Medications  oxyCODONE-acetaminophen (PERCOCET/ROXICET) 5-325 MG per tablet 1 tablet (1 tablet Oral Given 04/30/22 1404)    ED Course/ Medical Decision Making/ A&P                             Medical Decision Making Scott [name] 62 y.o. presented today for anosmia and lack of appetite. Working DDx that I considered at this time includes, but not limited to  COVID/Flu/RSV, URI, acute gastric enteritis, opioid/alcohol withdrawal  Review of prior external notes: Multiple visits to emergency room this year for pain management concerns  Unique Tests and My Interpretation:  Respiratory panel: negative  Discussion of Management of Tests: Patient's states that his symptoms of fatigue, agitation, and chills seem similar to his past withdrawal symptoms. However, he is mostly concerned with his change of appetite and anosmia. I have ordered him a dose of percocet to help with withdrawal symptoms while he is here in the ED, and I have also ordered a respiratory panel because patient was concerned that his change in sense of smell could be due  to COVID.  Risk: Medium - prescription drug management  Risk Stratification Score:  COWS: 8 points (mild withdrawal)  R/o DDx: These are considered less likely due to history of present illness and physical exam findings COVID/Flu/RSV: respiratory panel pending URI: patient denies cough, wheezing, rhinorrhea, headache acute gastric enteritis: patient denies vomiting, nausea, diarrhea  Plan: Patient complains of lack of appetite, anosmia, chills, fatigue, and agitation for the past 2-3 weeks after having changes of his pain management. Patient reports that his prescribed dose was too high, so he only took the recommended dose for 3 days. He reports changing his pain management dosing and combining pills multiple times over the past 3 weeks. Last dose was around 9AM this morning, to which he reports relief in his symptoms.  1:45PM I have ordered a respiratory panel for patient because he was concerned that his change in sense of smell was due to COVID. Patient has no other signs of infection on physical exam. Patient endorses that fatigue, agitation, and chills that he is experiencing today is similar to his past withdrawal symptoms. I have ordered patient one dose of percocet to help with his withdrawal symptoms while  his respiratory panel is pending.  3:30 PM Patient's respiratory panel is negative. He endorses that the percocet given in ED relieved his symptoms. During shared decision making with patient, I offered more ED workup for patient, but patient declined and stated that he was mostly concerned about his home prescription of percocet. I educated patient that I could not send him home with a new percocet prescription since he reported having enough pills to last him until his next appointment. I educated patient on the importance of discussing these concerns with his prescribing doctor. I also educated patient on taking medications as prescribed. Patient states that he filled his prescription for percocet last week. Patient agrees to follow up with his doctor that manages his pain control.  Patient was given return precautions. Patient stable for discharge at this time. Patient verbalized understanding of plan.    Risk Prescription drug management.           Final Clinical Impression(s) / ED Diagnoses Final diagnoses:  Anosmia  Appetite loss    Rx / DC Orders ED Discharge Orders     None         Margarita Rana 04/30/22 1549    Benjiman Core, MD 04/30/22 2252    Benjiman Core, MD 04/30/22 2257

## 2022-04-30 NOTE — ED Notes (Addendum)
Assumed care of this pt. Pt is CAOx4 at this time. Pt stated that since his doctor decreased his pain medication, he has lost his appetite. He is not longer seeing the same doctor. Pt states he feels as if he is going through withdraw from pain medication. Pt is in no obvious distress at this time. Speaking in complete sentences without struggle. Pts is not wearing his 02 at this time while talking to me. Pt states he only needs it at home when he is exerting himself. Pts room air is 95%.

## 2022-04-30 NOTE — Discharge Instructions (Addendum)
It was a pleasure taking care of you today. As discussed, please follow up with your pain management doctor. Seek emergency care if experiencing new/worsening symptoms.

## 2022-04-30 NOTE — ED Triage Notes (Addendum)
Pt arrived via POV. Pt stating that the medication they were given "has psychotropics in them". Stated that they have had a loss of appetite and weight loss recently. Attributes it to being given wrong medication.  Pt has COPD and is on home O2

## 2022-05-02 ENCOUNTER — Other Ambulatory Visit: Payer: Self-pay

## 2022-05-02 ENCOUNTER — Emergency Department (HOSPITAL_COMMUNITY)
Admission: EM | Admit: 2022-05-02 | Discharge: 2022-05-02 | Discharge status: Against Medical Advice | Payer: Medicare HMO | Attending: Emergency Medicine | Admitting: Emergency Medicine

## 2022-05-02 DIAGNOSIS — R0602 Shortness of breath: Secondary | ICD-10-CM | POA: Insufficient documentation

## 2022-05-02 DIAGNOSIS — Z5321 Procedure and treatment not carried out due to patient leaving prior to being seen by health care provider: Secondary | ICD-10-CM | POA: Diagnosis not present

## 2022-05-02 DIAGNOSIS — R630 Anorexia: Secondary | ICD-10-CM | POA: Diagnosis not present

## 2022-05-02 DIAGNOSIS — R634 Abnormal weight loss: Secondary | ICD-10-CM | POA: Diagnosis not present

## 2022-05-02 DIAGNOSIS — R439 Unspecified disturbances of smell and taste: Secondary | ICD-10-CM | POA: Diagnosis not present

## 2022-05-02 NOTE — ED Provider Notes (Signed)
I was informed by staff that patient eloped from the emergency room prior to my evaluation.   Glyn Ade, MD 05/02/22 2350

## 2022-05-02 NOTE — ED Triage Notes (Signed)
BIBA from home c/o SOB. States that he had some pain medication changed 3/19. Since this time, pt c/o SOB, decreased appetite, lack of taste and smell. Reports a 20 lb unintentional weight loss in the past few weeks. Pt told EMS that he is concerned that the pharmacy is poisoning him with this new medication (percocet 3-525 mg). Seen in ED a few days ago for similar symptoms.  Wears home O2 - 94% on 3L per EMS. O2 increased to 4L and pt came up to 98%.  EMS vitals: 112/60, HR 84, RR 18, 98% on 4L O2, CBG 109

## 2022-05-06 ENCOUNTER — Emergency Department (HOSPITAL_COMMUNITY)
Admission: EM | Admit: 2022-05-06 | Discharge: 2022-05-07 | Disposition: A | Discharge status: Home / Self Care | Payer: Medicare HMO | Attending: Emergency Medicine | Admitting: Emergency Medicine

## 2022-05-06 DIAGNOSIS — N189 Chronic kidney disease, unspecified: Secondary | ICD-10-CM | POA: Diagnosis not present

## 2022-05-06 DIAGNOSIS — T402X5A Adverse effect of other opioids, initial encounter: Secondary | ICD-10-CM | POA: Insufficient documentation

## 2022-05-06 DIAGNOSIS — M542 Cervicalgia: Secondary | ICD-10-CM | POA: Insufficient documentation

## 2022-05-06 DIAGNOSIS — T50905A Adverse effect of unspecified drugs, medicaments and biological substances, initial encounter: Secondary | ICD-10-CM

## 2022-05-06 DIAGNOSIS — G8929 Other chronic pain: Secondary | ICD-10-CM | POA: Insufficient documentation

## 2022-05-06 DIAGNOSIS — Z7982 Long term (current) use of aspirin: Secondary | ICD-10-CM | POA: Insufficient documentation

## 2022-05-06 DIAGNOSIS — Z7951 Long term (current) use of inhaled steroids: Secondary | ICD-10-CM | POA: Diagnosis not present

## 2022-05-06 DIAGNOSIS — J449 Chronic obstructive pulmonary disease, unspecified: Secondary | ICD-10-CM | POA: Diagnosis not present

## 2022-05-06 DIAGNOSIS — I509 Heart failure, unspecified: Secondary | ICD-10-CM | POA: Diagnosis not present

## 2022-05-06 DIAGNOSIS — T887XXA Unspecified adverse effect of drug or medicament, initial encounter: Secondary | ICD-10-CM | POA: Insufficient documentation

## 2022-05-06 NOTE — ED Triage Notes (Signed)
Pt arrived POV for medication concern, thinks the doctors are poisoning him with his suboxone and percocet, reports stop taking those back in Jan, but feels his current script of percocet 10-325 is mixed with both and he reports "feeling high" no "taste buds". Pt wants the provider to check his medication and figure out medication. 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 Pt denies SI & HI. Pt is rambling, conversation hard to follow and repetitive.

## 2022-05-07 ENCOUNTER — Encounter (HOSPITAL_COMMUNITY): Payer: Self-pay

## 2022-05-07 DIAGNOSIS — M542 Cervicalgia: Secondary | ICD-10-CM | POA: Diagnosis not present

## 2022-05-07 LAB — COMPREHENSIVE METABOLIC PANEL
ALT: 14 U/L (ref 0–44)
AST: 14 U/L — ABNORMAL LOW (ref 15–41)
Albumin: 4 g/dL (ref 3.5–5.0)
Alkaline Phosphatase: 60 U/L (ref 38–126)
Anion gap: 6 (ref 5–15)
BUN: 14 mg/dL (ref 8–23)
CO2: 29 mmol/L (ref 22–32)
Calcium: 8.9 mg/dL (ref 8.9–10.3)
Chloride: 106 mmol/L (ref 98–111)
Creatinine, Ser: 0.85 mg/dL (ref 0.61–1.24)
GFR, Estimated: >60 mL/min (ref >60)
Glucose, Bld: 102 mg/dL — ABNORMAL HIGH (ref 70–99)
Potassium: 4 mmol/L (ref 3.5–5.1)
Sodium: 141 mmol/L (ref 135–145)
Total Bilirubin: 0.8 mg/dL (ref 0.3–1.2)
Total Protein: 6.8 g/dL (ref 6.5–8.1)

## 2022-05-07 LAB — RAPID URINE DRUG SCREEN, HOSP PERFORMED
Amphetamines: NOT DETECTED
Barbiturates: NOT DETECTED
Benzodiazepines: POSITIVE — AB
Cocaine: NOT DETECTED
Opiates: POSITIVE — AB
Tetrahydrocannabinol: NOT DETECTED

## 2022-05-07 LAB — CBC WITH DIFFERENTIAL/PLATELET
Abs Immature Granulocytes: 0.02 10*3/uL (ref 0.00–0.07)
Basophils Absolute: 0 10*3/uL (ref 0.0–0.1)
Basophils Relative: 0 %
Eosinophils Absolute: 0.1 10*3/uL (ref 0.0–0.5)
Eosinophils Relative: 1 %
HCT: 42.3 % (ref 39.0–52.0)
Hemoglobin: 13.4 g/dL (ref 13.0–17.0)
Immature Granulocytes: 0 %
Lymphocytes Relative: 30 %
Lymphs Abs: 2.1 10*3/uL (ref 0.7–4.0)
MCH: 28.3 pg (ref 26.0–34.0)
MCHC: 31.7 g/dL (ref 30.0–36.0)
MCV: 89.4 fL (ref 80.0–100.0)
Monocytes Absolute: 0.6 10*3/uL (ref 0.1–1.0)
Monocytes Relative: 9 %
Neutro Abs: 4.3 10*3/uL (ref 1.7–7.7)
Neutrophils Relative %: 60 %
Platelets: 184 10*3/uL (ref 150–400)
RBC: 4.73 MIL/uL (ref 4.22–5.81)
RDW: 12.9 % (ref 11.5–15.5)
WBC: 7.2 10*3/uL (ref 4.0–10.5)
nRBC: 0 % (ref 0.0–0.2)

## 2022-05-07 LAB — ETHANOL: Alcohol, Ethyl (B): <10 mg/dL (ref <10)

## 2022-05-07 MED ORDER — OXYCODONE-ACETAMINOPHEN 5-325 MG PO TABS
1.0000 | ORAL_TABLET | Freq: Once | ORAL | Status: AC
  Administered 2022-05-07: 1 via ORAL
  Filled 2022-05-07: qty 1

## 2022-05-07 NOTE — ED Provider Notes (Signed)
EMERGENCY DEPARTMENT AT [facility] Provider Note   CSN: [id number] Arrival date & time: 05/06/22  2247     History  Chief Complaint  Patient presents with   Mental Health Problem    Scott [name] is a 62 y.o. male.  Patient with history of CHF, COPD, CKD, and chronic pain from several spinal surgeries presents today with complaints of adverse medication reaction. He states that he was prescribed percocet 10-325 for his chronic pain. States that he is having intolerance to this medication because 'it makes me feel high for almost 24 hours after taking it.' He is concerned that he is actually being given suboxone because this is how he used to feel when he was on this medication previously. He has a prescription bottle that reads Percocet 10-325, but he believes he is being lied to. He states that he has has an appointment with his pcp coming up to discuss his concerns, however he is requesting a dose of our percocet here as it does not cause the same reaction. Patient has had several ER visits with similar story over the past month and has been given our percocet 5-325 with improvement. He currently denies any symptoms other than his chronic neck pain. He has not taken his prescribed pain medication since yesterday morning. He denies any headaches, vision changes, fevers, chills, weakness, chest pain, shortness of breath, nausea, or vomiting.   The history is provided by the patient. No language interpreter was used.  Mental Health Problem      Home Medications Prior to Admission medications   Medication Sig Start Date End Date Taking? Authorizing Provider  albuterol (VENTOLIN HFA) 108 (90 Base) MCG/ACT inhaler INHALE 1 TO 2 PUFFS BY MOUTH EVERY 6 HOURS AS NEEDED FOR WHEEZING OR SHORTNESS OF BREATH 04/14/22   Leslye Peer, MD  aspirin EC 81 MG tablet Take 81 mg by mouth daily.    [provider]  bisoprolol (ZEBETA) 5 MG tablet Take 1 tablet (5 mg  total) by mouth daily. 09/13/21   Joylene Grapes, NP  diazepam (VALIUM) 5 MG tablet Take 5 mg by mouth 2 (two) times daily as needed for anxiety. 08/07/18   [provider]  fluticasone (FLONASE) 50 MCG/ACT nasal spray USE 2 SPRAYS NASALLY EVERY DAY 05/25/21   Leslye Peer, MD  furosemide (LASIX) 20 MG tablet Take 1 tablet (20 mg total) by mouth every other day. 09/13/21   Joylene Grapes, NP  gabapentin (NEURONTIN) 300 MG capsule TAKE 1 CAPSULE BY MOUTH 3 TIMES DAILY 11/21/18   Lezlie Lye, Meda Coffee, MD  Ipratropium-Albuterol (COMBIVENT RESPIMAT) 20-100 MCG/ACT AERS respimat INHALE 1 PUFF BY MOUTH FOUR TIMES DAILY 04/10/22   Leslye Peer, MD  ipratropium-albuterol (DUONEB) 0.5-2.5 (3) MG/3ML SOLN Inhale 1 vial via nebulizer every 6 hours as needed 07/08/21   Leslye Peer, MD  JANUMET 50-500 MG tablet TAKE 1 TABLET BY MOUTH ONCE A DAY 07/31/18   Lezlie Lye, Meda Coffee, MD  [facility] 4 MG/0.1ML LIQD nasal spray kit Place 1 spray into the nose daily as needed (For overdose).  06/13/18   [provider]  omeprazole (PRILOSEC) 40 MG capsule TAKE 1 CAPSULE BY MOUTH EVERY DAY 01/08/19   Lezlie Lye, Meda Coffee, MD  oxyCODONE 10 MG TABS Take 1 tablet (10 mg total) by mouth every 4 (four) hours as needed for severe pain. 04/08/22   Melene Plan, DO  oxyCODONE-acetaminophen (PERCOCET) 10-325 MG tablet Take  1 tablet by mouth every 4 (four) hours. 05/28/18   Roberto Scales D, MD  rosuvastatin (CRESTOR) 10 MG tablet Take 1 tablet (10 mg total) by mouth daily. 09/13/21   Joylene Grapes, NP  sacubitril-valsartan (ENTRESTO) 97-103 MG Take 1 tablet by mouth 2 (two) times daily. 03/20/22   Rollene Rotunda, MD  Vitamin D, Ergocalciferol, (DRISDOL) 1.25 MG (50000 UT) CAPS capsule TAKE ONE CAPSULE BY MOUTH ONCE WEEKLY 12/18/18   Lezlie Lye, Meda Coffee, MD      Allergies    Penicillins and Cymbalta [duloxetine hcl]    Review of Systems   Review of Systems  All other systems reviewed and are  negative.   Physical Exam Updated Vital Signs BP 106/89   Pulse 71   Temp 98.1 F (36.7 C) (Oral)   Resp 18   Ht 5\' 8"  (1.727 m)   Wt 78.5 kg   SpO2 96%   BMI 26.30 kg/m  Physical Exam Vitals and nursing note reviewed.  Constitutional:      General: He is not in acute distress.    Appearance: Normal appearance. He is normal weight. He is not ill-appearing, toxic-appearing or diaphoretic.  HENT:     Head: Normocephalic and atraumatic.  Cardiovascular:     Rate and Rhythm: Normal rate.  Pulmonary:     Effort: Pulmonary effort is normal. No respiratory distress.     Comments: Chronically on oxygen Musculoskeletal:        General: Normal range of motion.     Cervical back: Normal range of motion.  Skin:    General: Skin is warm and dry.  Neurological:     General: No focal deficit present.     Mental Status: He is alert.  Psychiatric:        Mood and Affect: Mood normal.        Behavior: Behavior normal.     ED Results / Procedures / Treatments   Labs (all labs ordered are listed, but only abnormal results are displayed) Labs Reviewed  COMPREHENSIVE METABOLIC PANEL - Abnormal; Notable for the following components:      Result Value   Glucose, Bld 102 (*)    AST 14 (*)    All other components within normal limits  RAPID URINE DRUG SCREEN, HOSP PERFORMED - Abnormal; Notable for the following components:   Opiates POSITIVE (*)    Benzodiazepines POSITIVE (*)    All other components within normal limits  ETHANOL  CBC WITH DIFFERENTIAL/PLATELET    EKG None  Radiology No results found.  Procedures Procedures    Medications Ordered in ED Medications  oxyCODONE-acetaminophen (PERCOCET/ROXICET) 5-325 MG per tablet 1 tablet (has no administration in time range)    ED Course/ Medical Decision Making/ A&P                             Medical Decision Making Amount and/or Complexity of Data Reviewed Labs: ordered.  Risk Prescription drug  management.   This patient is a 62 y.o. male who presents to the ED for concern of adverse medication reaction  Past Medical History / Co-morbidities / Social History: history of CHF, COPD, CKD, and chronic pain from several spinal surgeries  Additional history: Chart reviewed. Pertinent results include: multiple ER visits this year for pain management concerns similar to presentation today. Appears he normally has benign evaluation and is given single dose of our percocet and is discharged.  Physical Exam:  Physical exam performed. The pertinent findings include: chronically on oxygen  Lab Tests: I ordered, and personally interpreted labs.  The pertinent results include:  UDS + for opiates and benzos   Medications: I ordered medication including percocet  for pain. Reevaluation of the patient after these medicines showed that the patient improved. I have reviewed the patients home medicines and have made adjustments as needed.   Disposition:  Patient afebrile, non-toxic appearing, and in no acute distress with reassuring vital signs. Laboratory evaluation benign. Patient given our Percocet 5-325 for his chronic pain with relief. Recommend patient break his home percocet in half in the interim before he can see his pcp if he is not able to tolerate this narcotic dose. Hopefully this will help him given that he seems to do well with our 5-325 dose as opposed to his 10-325 dose that he has been given. After being given our percocet for pain, patient declines additional evaluation and states he would like to go home. Evaluation and diagnostic testing in the emergency department does not suggest an emergent condition requiring admission or immediate intervention beyond what has been performed at this time.  Plan for discharge with close PCP follow-up.  Patient is understanding and amenable with plan, educated on red flag symptoms that would prompt immediate return.  Patient discharged in stable  condition.   Final Clinical Impression(s) / ED Diagnoses Final diagnoses:  Adverse effect of drug, initial encounter    Rx / DC Orders ED Discharge Orders     None     An After Visit Summary was printed and given to the patient.     Vear Clock 05/07/22 0940    Mardene Sayer, MD 05/07/22 [phone]

## 2022-05-07 NOTE — Discharge Instructions (Signed)
As we discussed, your workup in the ER today was reassuring for acute findings.  Laboratory evaluation did not reveal any emergent concerns.  You will need to follow-up with your primary care doctor at your earliest convenience to discuss management of your chronic pain medication.  Return if development of any new or worsening symptoms.

## 2022-05-17 ENCOUNTER — Ambulatory Visit (INDEPENDENT_AMBULATORY_CARE_PROVIDER_SITE_OTHER): Payer: Medicare HMO | Admitting: Emergency Medicine

## 2022-05-17 ENCOUNTER — Encounter: Payer: Self-pay | Admitting: Emergency Medicine

## 2022-05-17 VITALS — BP 118/86 | HR 70 | Temp 98.2°F | Ht 68.0 in | Wt 177.8 lb

## 2022-05-17 DIAGNOSIS — F17200 Nicotine dependence, unspecified, uncomplicated: Secondary | ICD-10-CM | POA: Diagnosis not present

## 2022-05-17 DIAGNOSIS — J439 Emphysema, unspecified: Secondary | ICD-10-CM | POA: Diagnosis not present

## 2022-05-17 DIAGNOSIS — J9611 Chronic respiratory failure with hypoxia: Secondary | ICD-10-CM | POA: Diagnosis not present

## 2022-05-17 NOTE — Addendum Note (Signed)
Addended by: Dorisann Frames R on: 05/17/2022 12:58 PM   Modules accepted: Orders

## 2022-05-17 NOTE — Assessment & Plan Note (Signed)
Discussed cessation in detail with him today.  He smoking 2 cigarettes daily and is motivated to stop.  He is due for his lung cancer screening CT chest in 03/2023.

## 2022-05-17 NOTE — Progress Notes (Signed)
Subjective:    Patient ID: Scott [name], male    DOB: January 11, 1961, 62 y.o.   MRN: [id number]  HPI  ROV 04/14/2022 --follow-up visit for 62 year old gentleman with history of tobacco use and associated severe COPD.  He has chronic hypoxemic respiratory failure, cough, mucus production.  Also with chronic allergic rhinitis.  He participates in lung cancer screening program, most recent CT 04/10/2022 as below.  Currently managed on Combivent 4 times a day on schedule, DuoNeb as needed, fluticasone nasal spray He had a tobacco relapse for about a month from Feb to March. He has been in the ED several times this year for issues with his pain control, chest pain. He reports that his breathing has done fairly well despite the smoking relapse. He has stopped for the last week, back on nicotine patches, also using  gum w success. No COPD flares. Flu shot up to date.   CT scan of the chest 04/10/2022 reviewed by me, shows moderate centrilobular and paraseptal emphysema, scattered pulmonary nodules that are unchanged, largest 3.4 mm.  Lung RADS 2 study.  ROV 05/17/22 --62 year old man with a history of tobacco use, severe COPD, associated chronic hypoxemic respiratory failure, cough and mucus production.  Regimen is Combivent, DuoNeb as needed.  He is supposed on 3 L/min, sees desats when he is exerting on RA. He is not wearing the O2 reliably.  Next lung cancer screening CT chest needs to be done in March 2025. Today he reports that he is planning to see ortho regarding cervical radiculopathy, some lumbar radiculopathy. He hopes to get a smaller POC. Using combivent reliably. Using albuterol 3-4x a day. He is smoking 2 cig a day. No cough.         No data to display          Objective:   Physical Exam  Vitals:   05/17/22 1010  BP: 118/86  Pulse: 70  Temp: 98.2 F (36.8 C)  TempSrc: Oral  SpO2: 92%  Weight: 177 lb 12.8 oz (80.6 kg)  Height:  (1.727 m)    Gen: Pleasant, well-nourished,  in no distress  ENT: No lesions,  mouth clear,  oropharynx clear, no postnasal drip,   Neck: No JVD, no stridor  Lungs: mostly clear, some inspiratory wheeze, bilateral end expiratory wheeze  Cardiovascular: RRR, heart sounds normal, no murmur or gallops, trace peripheral edema  Musculoskeletal: No deformities, no cyanosis or clubbing  Neuro: alert, appropriate, non-focal  Skin: Warm     Assessment & Plan:  COPD (chronic obstructive pulmonary disease) Overall stable.  Managed on scheduled Combivent.  He does not have DuoNeb right now but has been using albuterol as needed, usually 3-4 times a day.  Most important issue at this time is oxygen compliance.  Discussed this with him in detail today.  He is also working on smoking cessation. He has cervical radiculopathy, lumbar radiculopathy and is going to see orthopedics.  I did explain to him that if he ultimately needs a surgery that he is at increased risk for general anesthesia due to his COPD.  This does not preclude him from having surgery but needs to be taken into account.  Tobacco use disorder Discussed cessation in detail with him today.  He smoking 2 cigarettes daily and is motivated to stop.  He is due for his lung cancer screening CT chest in 03/2023.  Chronic respiratory failure with hypoxia (HCC) He does not use his oxygen reliably with exertion.  We talked about  this and he is going to start preemptively putting on when he exerts himself.  He wants a lighter POC.  He states that he is had his current POC for over 5 years so we should be able to order this for him.        Levy Pupa, MD, PhD 05/17/2022, 10:55 AM [facility] [phone] or if no answer [phone]

## 2022-05-17 NOTE — Assessment & Plan Note (Addendum)
Overall stable.  Managed on scheduled Combivent.  He does not have DuoNeb right now but has been using albuterol as needed, usually 3-4 times a day.  Most important issue at this time is oxygen compliance.  Discussed this with him in detail today.  He is also working on smoking cessation. He has cervical radiculopathy, lumbar radiculopathy and is going to see orthopedics.  I did explain to him that if he ultimately needs a surgery that he is at increased risk for general anesthesia due to his COPD.  This does not preclude him from having surgery but needs to be taken into account.

## 2022-05-17 NOTE — Progress Notes (Signed)
Cardiology Clinic Note   Patient Name: Scott [name] Date of Encounter: 05/19/2022  Primary Care Provider:  Woodfin Ganja, MD Primary Cardiologist:  None  Patient Profile    Scott [name] 62 year old male presents the clinic today for follow-up evaluation of his chronic systolic CHF and hypertension.  Past Medical History    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   COPD (chronic obstructive pulmonary disease) (HCC)    Diabetes mellitus without complication (HCC)    GERD (gastroesophageal reflux disease)    Headache(784.0)    Heart attack (HCC)    Pneumonia    hx   Past Surgical History:  Procedure Laterality Date   ANTERIOR CERVICAL DECOMP/DISCECTOMY FUSION  12/22/2010   Procedure: ANTERIOR CERVICAL DECOMPRESSION/DISCECTOMY FUSION 3 LEVELS;  Surgeon: Cristi Loron;  Location: [facility];  Service: Neurosurgery;  Laterality: N/A;  Anterior cervical discectomy with fusion cervical three-four, four-five, and five sixCDF with Interbody Prosthesis, plating, and Bone Graft    ANTERIOR CERVICAL DECOMP/DISCECTOMY FUSION N/A 10/16/2012   Procedure: CERVICAL SIX-SEVEN ANTERIOR CERVICAL DECOMPRESSION/DISCECTOMY FUSION WITH INTERBODY PROTHESIS PLATING BONEGRAFT WITH /POSSIBLE HARDWARE REMOVAL OLD PLATE;  Surgeon: Cristi Loron, MD;  Location: [facility];  Service: Neurosurgery;  Laterality: N/A;   MULTIPLE TOOTH EXTRACTIONS     OTHER SURGICAL HISTORY     surgery on cheekbone and head for fall 2000   OTHER SURGICAL HISTORY     states when about 62yrs old he was urinating blood, 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 and  had surgery for this   RIGHT/LEFT HEART CATH AND CORONARY ANGIOGRAPHY N/A 09/23/2018   Procedure: RIGHT/LEFT HEART CATH AND CORONARY ANGIOGRAPHY and possible PCI/stent;  Surgeon: Kathleene Hazel, MD;  Location: [facility];  Service: Cardiovascular;  Laterality: N/A;   SPINE SURGERY      2014 and 2016 - Dr Lovell Sheehan   TONSILLECTOMY      Allergies  Allergies  Allergen Reactions   Penicillins Anaphylaxis    Has patient had a PCN reaction causing immediate rash, facial/tongue/throat swelling, SOB or lightheadedness with hypotension: Yes Has patient had a PCN reaction causing severe rash involving mucus membranes or skin necrosis: Yes Has patient had a PCN reaction that required hospitalization: Yes Has patient had a PCN reaction occurring within the last 10 years: No If all of the above answers are "NO", then may proceed with Cephalosporin use.    Cymbalta [Duloxetine Hcl]     "crazy thoughts"    History of Present Illness    Scott [name] has a PMH of chronic systolic CHF, nonischemic cardiomyopathy, hypertension, COPD, chronic respiratory failure, GERD, diabetes mellitus, CKD, hyperlipidemia, tachycardia, tobacco use, and chronic pain syndrome.  He underwent cardiac catheterization 09/23/2018 which showed no evidence of CAD, nonischemic cardiomyopathy, and elevated filling pressures.  Echocardiogram 06/04/2020 showed recovered EF of 60-65%, G1 DD, trivial mitral valve regurgitation and no other significant valvular abnormalities.  He followed up with Joni Reining, DNP on 05/07/2020.  During that time he was doing fairly well.  He did note occasional palpitations.  He reported as if at times it felt like his heart would stop.  He continued his Entresto 49-51 and was tolerating it well.  He was walking daily and completing around 2 miles per day.  His son lives a block and a half away from his house and he would walk back and forth 3  times per day for exercise.  He was no longer having to care for his grandchildren.  He reported that his home was much more peaceful and he was enjoying time with his wife.  He presented to the clinic 03/10/21 for follow-up evaluation and stated his sister passed away  in [place].  He was planning to leave after his appointment to go to her  funeral in the [place] area.  3 weeks before with the added stress of his sister's failing health he started smoking again.  He also reported that he had been using his oxygen as needed.  He reported that he occasionally had right-sided chest pressure at rest.  His episodes were intermittent and brief.  In the office  his oxygen saturation was 81%.  He denied exertional chest discomfort  and shortness of breath today.  We reviewed the importance of wearing his home oxygen and not smoking.  He expressed understanding.  His blood pressure had been well controlled.  He reported compliance with his medications and denied side effects.  I  gave him the salty 6 diet sheet, smoking cessation information, and planned follow-up for 6 months.  He presented to the clinic 03/10/21 for follow-up evaluation stated he traveled back and forth from [place] a total of 3 times.  He reported that the drive was very difficult because he was doing all the driving.  He and his wife travel together.  He reported that he continued to smoke cigarettes about 6/day since his sister passed away.  We reviewed the importance of smoking cessation.  His weight had decreased to around 180 pounds.  He reported that his weight had remained stable and he continued to follow a heart healthy low-sodium diet.  I  decreased his Lasix to every other day.  He did note some chest tightness with deep inspiration.  This appeared to be related to his COPD.  His blood pressure continued to be well controlled.  follow-up for 6 to 9 months was planned.  He was seen in follow-up by Dr. Antoine Poche on 03/20/2022.  He had been seen in the emergency department recently.  His visit was reviewed.  He had been having tinnitus and was taking 500 mg of aspirin.  He continued to wear his oxygen as needed.  He remained stable from a cardiac standpoint.  He was helping to raise his 3 grandchildren who are in their early 86s and a granddaughter that was 82.  His  blood pressure was well-controlled at 116/70 and his oxygen saturation was 90%.  He presents to the clinic today for follow-up evaluation and states he is working on having neck surgery and will see a surgeon 5/31.  He was recently seen in the emergency department for medication management.  He has been having trouble with his pain medication.  He presents today carrying his oxygen concentrator.  He is satting 88% on room air and has been using his oxygen as needed with physical activity.  He reports he has been walking about 30 minutes/day.  He is euvolemic and has been following a heart healthy diet.  I will repeat his fasting lipids and LFTs today and we will plan follow-up in 6 months.  Today he denies chest pain, increased shortness of breath, lower extremity edema, fatigue, palpitations, melena, hematuria, hemoptysis, diaphoresis, weakness, presyncope, syncope, orthopnea, and PND.   Home Medications    Prior to Admission medications   Medication Sig Start Date End Date Taking? Authorizing Provider  ADVAIR DISKUS 250-50 MCG/ACT AEPB INHALE 1 PUFF TWICE DAILY 07/19/20   Leslye Peer, MD  aspirin 325 MG EC tablet Take 81 mg by mouth daily.    [provider]  bisoprolol (ZEBETA) 5 MG tablet Take 1 tablet (5 mg total) by mouth daily. 03/08/20   Rollene Rotunda, MD  Buprenorphine HCl (BELBUCA) 450 MCG FILM Place inside cheek.    [provider]  buPROPion [facility] SR) 150 MG 12 hr tablet  07/03/19   [provider]  diazepam (VALIUM) 5 MG tablet Take 5 mg by mouth 2 (two) times daily as needed for anxiety. 08/07/18   [provider]  fluticasone (FLONASE) 50 MCG/ACT nasal spray USE 2 SPRAYS NASALLY EVERY DAY 05/17/20   Leslye Peer, MD  furosemide (LASIX) 20 MG tablet Take 1 tablet (20 mg total) by mouth daily. 05/07/20 08/05/20  Jodelle Gross, NP  gabapentin (NEURONTIN) 300 MG capsule TAKE 1 CAPSULE BY MOUTH 3 TIMES DAILY 11/21/18   Lezlie Lye, Meda Coffee, MD  Ipratropium-Albuterol (COMBIVENT RESPIMAT) 20-100 MCG/ACT AERS respimat INHALE 1 PUFF FOUR TIMES DAILY 08/10/20   Leslye Peer, MD  ipratropium-albuterol (DUONEB) 0.5-2.5 (3) MG/3ML SOLN INHALE THE CONTENTS OF 1 VIAL VIA NEBULIZER EVERY 6 HOURS AS NEEDED 05/17/20   Leslye Peer, MD  JANUMET 50-500 MG tablet TAKE 1 TABLET BY MOUTH ONCE A DAY 07/31/18   Lezlie Lye, Meda Coffee, MD  [facility] 4 MG/0.1ML LIQD nasal spray kit Place 1 spray into the nose daily as needed (For overdose).  06/13/18   [provider]  nystatin (MYCOSTATIN) 100000 UNIT/ML suspension Take 5 mLs (500,000 Units total) by mouth 4 (four) times daily. 03/08/20   Rollene Rotunda, MD  omeprazole (PRILOSEC) 40 MG capsule TAKE 1 CAPSULE BY MOUTH EVERY DAY 01/08/19   Lezlie Lye, Meda Coffee, MD  oxyCODONE-acetaminophen (PERCOCET) 10-325 MG tablet Take 1 tablet by mouth every 4 (four) hours. 05/28/18   Roberto Scales D, MD  rosuvastatin (CRESTOR) 10 MG tablet Take 1 tablet (10 mg total) by mouth daily. 05/07/20   Jodelle Gross, NP  sacubitril-valsartan (ENTRESTO) 49-51 MG Take 1 tablet by mouth 2 (two) times daily. 03/08/20   Rollene Rotunda, MD  VENTOLIN HFA 108 (90 Base) MCG/ACT inhaler INHALE 1 TO 2 PUFFS INTO THE LUNGS EVERY 6 HOURS AS NEEDED FOR WHEEZING OR SHORTNESS OF BREATH 02/16/20   Byrum, Les Pou, MD  Vitamin D, Ergocalciferol, (DRISDOL) 1.25 MG (50000 UT) CAPS capsule TAKE ONE CAPSULE BY MOUTH ONCE WEEKLY 12/18/18   Lezlie Lye, Meda Coffee, MD    Family History    Family History  Problem Relation Age of Onset   Emphysema Mother    COPD Mother    Asthma Brother    Diabetes Brother    Hyperlipidemia Brother    Hypertension Brother    Asthma Sister    Hyperlipidemia Sister    Hypertension Sister    Mental illness Sister    Heart disease Maternal Uncle    Hypertension Brother    He indicated that his mother is alive. He indicated that his father is deceased. He indicated that the status of his sister is  unknown. He indicated that the status of his maternal uncle is unknown.   Social History    Social History   Socioeconomic History   Marital status: Married    Spouse name: Not on file   Number of children: 4   Years of education: Not on file  Highest education level: Not on file  Occupational History   Occupation: DISABLED  Tobacco Use   Smoking status: Every Day    Packs/day: 0.15    Years: 47.00    Additional pack years: 0.00    Total pack years: 7.05    Types: Cigarettes    Start date: 01/23/1973    Last attempt to quit: 12/03/2019    Years since quitting: 2.4   Smokeless tobacco: Never   Tobacco comments:    2 cigarettes smoked daily ARJ 05/17/22  Vaping Use   Vaping Use: Never used  Substance and Sexual Activity   Alcohol use: Not Currently    Comment: Beer occasionally   Drug use: No   Sexual activity: Not Currently  Other Topics Concern   Not on file  Social History Narrative   Not on file   Social Determinants of Health   Financial Resource Strain: Not on file  Food Insecurity: Not on file  Transportation Needs: No Transportation Needs (11/05/2018)   PRAPARE - Administrator, Civil Service (Medical): No    Lack of Transportation (Non-Medical): No  Physical Activity: Not on file  Stress: Not on file  Social Connections: Not on file  Intimate Partner Violence: Not on file     Review of Systems    General:  No chills, fever, night sweats or weight changes.  Cardiovascular:  No chest pain, dyspnea on exertion, edema, orthopnea, palpitations, paroxysmal nocturnal dyspnea. Dermatological: No rash, lesions/masses Respiratory: No cough, dyspnea Urologic: No hematuria, dysuria Abdominal:   No nausea, vomiting, diarrhea, bright red blood per rectum, melena, or hematemesis Neurologic:  No visual changes, wkns, changes in mental status. All other systems reviewed and are otherwise negative except as noted above.  Physical Exam    VS:  BP 102/66  (BP Location: Left Arm, Patient Position: Sitting, Cuff Size: Normal)   Pulse 77   Ht 5\' 8"  (1.727 m)   Wt 182 lb 3.2 oz (82.6 kg)   SpO2 (!) 88%   BMI 27.70 kg/m  , BMI Body mass index is 27.7 kg/m. GEN: Well nourished, well developed, in no acute distress. HEENT: normal. Neck: Supple, no JVD, carotid bruits, or masses. Cardiac: RRR, no murmurs, rubs, or gallops. No clubbing, cyanosis, edema.  Radials/DP/PT 2+ and equal bilaterally.  Respiratory:  Respirations regular and unlabored, clear to auscultation bilaterally. GI: Soft, nontender, nondistended, BS + x 4. MS: no deformity or atrophy. Skin: warm and dry, no rash. Neuro:  Strength and sensation are intact. Psych: Normal affect.  Accessory Clinical Findings    Recent Labs: 06/06/2021: B Natriuretic Peptide 16.6 03/11/2022: Magnesium 2.1 05/07/2022: ALT 14; BUN 14; Creatinine, Ser 0.85; Hemoglobin 13.4; Platelets 184; Potassium 4.0; Sodium 141   Recent Lipid Panel    Component Value Date/Time   CHOL 119 03/10/2021 1050   TRIG 75 03/10/2021 1050   HDL 40 03/10/2021 1050   CHOLHDL 3.0 03/10/2021 1050   LDLCALC 64 03/10/2021 1050    ECG personally reviewed by me today   none today.  EKG 03/10/21  sinus rhythm with first-degree AV block 71 bpm no acute changes.    Echocardiogram 06/04/2020  IMPRESSIONS     1. Left ventricular ejection fraction, by estimation, is 60 to 65%. The  left ventricle has normal function. The left ventricle has no regional  wall motion abnormalities. Left ventricular diastolic parameters are  consistent with Grade I diastolic  dysfunction (impaired relaxation).   2. Right ventricular systolic function  is normal. The right ventricular  size is normal. There is normal pulmonary artery systolic pressure.   3. The mitral valve is normal in structure. Trivial mitral valve  regurgitation. No evidence of mitral stenosis.   4. The aortic valve is tricuspid. Aortic valve regurgitation is not   visualized. No aortic stenosis is present.   5. The inferior vena cava is normal in size with greater than 50%  respiratory variability, suggesting right atrial pressure of 3 mmHg.   Assessment & Plan   1.  Chronic systolic CHF-weight today 182. 2 lbs.  Euvolemic .  NYHA class II-3 today.   Echocardiogram showed recovered EF 60-65%.  Continues to be active walking daily for around 30 minutes. Continue current medical therapy Heart healthy low-sodium diet-reviewed  Essential hypertension-BP today 102/66.  Well-controlled at home. Continue Entresto, bisoprolol Heart healthy low-sodium diet-salty 6 given Maintain physical activity as tolerated  Hyperlipidemia-LDL 64 03/10/21 Continue rosuvastatin, aspirin Heart healthy low-sodium high-fiber diet-high-fiber options reviewed. Maintain physical activity as tolerated Fasting Lipids and lfts  COPD-continues to use oxygen as needed.  Stable at this time.  Continues to feel better with exercise.  Continues to follow with pulmonology  Tobacco abuse-unfortunately he continues to smoke. Discuss cessation and importance. Smoking cessation information given   Disposition: Follow-up with Dr. Antoine Poche or me in 6 months.   Thomasene Ripple. Cortavius Montesinos NP-C    05/19/2022, 8:25 AM [facility] [address] Office [phone] Fax [phone]  Notice: This dictation was prepared with Dragon dictation along with smaller phrase technology. Any transcriptional errors that result from this process are unintentional and may not be corrected upon review.  I spent 14 minutes examining this patient, reviewing medications, and using patient centered shared decision making involving her cardiac care.  Prior to her visit I spent greater than 20 minutes reviewing her past medical history,  medications, and prior cardiac tests.

## 2022-05-17 NOTE — Patient Instructions (Signed)
Continue to work hard on decreasing your cigarettes.  Goal is to stop altogether. Continue your Combivent 2 puffs 4 times a day on a schedule. Keep albuterol available to use 2 puffs if needed for shortness of breath, chest tightness, wheezing. Start using your oxygen 3 L/min with all exertion.  Put it on before you get up to exert yourself to avoid episodes when your saturations drop. We will work on getting you a lighter portable oxygen concentrator. Your next lung cancer screening CT scan of the chest will be done in March 2025 Follow with APP in 3 months Follow Dr. Delton Coombes in 6 months, sooner if you have problems

## 2022-05-17 NOTE — Assessment & Plan Note (Signed)
He does not use his oxygen reliably with exertion.  We talked about this and he is going to start preemptively putting on when he exerts himself.  He wants a lighter POC.  He states that he is had his current POC for over 5 years so we should be able to order this for him.

## 2022-05-19 ENCOUNTER — Encounter: Payer: Self-pay | Admitting: General Practice

## 2022-05-19 ENCOUNTER — Ambulatory Visit: Payer: Medicare HMO | Attending: General Practice | Admitting: General Practice

## 2022-05-19 VITALS — BP 102/66 | HR 77 | Ht 68.0 in | Wt 182.2 lb

## 2022-05-19 DIAGNOSIS — I10 Essential (primary) hypertension: Secondary | ICD-10-CM | POA: Diagnosis not present

## 2022-05-19 DIAGNOSIS — E7800 Pure hypercholesterolemia, unspecified: Secondary | ICD-10-CM | POA: Diagnosis not present

## 2022-05-19 DIAGNOSIS — Z720 Tobacco use: Secondary | ICD-10-CM

## 2022-05-19 DIAGNOSIS — I5022 Chronic systolic (congestive) heart failure: Secondary | ICD-10-CM

## 2022-05-19 DIAGNOSIS — J441 Chronic obstructive pulmonary disease with (acute) exacerbation: Secondary | ICD-10-CM

## 2022-05-19 NOTE — Patient Instructions (Addendum)
Medication Instructions:  The current medical regimen is effective;  continue present plan and medications as directed. Please refer to the Current Medication list given to you today.  *If you need a refill on your cardiac medications before your next appointment, please call your pharmacy*  Lab Work: LIPID AND LFT TODAY If you have labs (blood work) drawn today and your tests are completely normal, you will receive your results only by: MyChart Message (if you have MyChart) OR  A paper copy in the mail If you have any lab test that is abnormal or we need to change your treatment, we will call you to review the results.  Testing/Procedures: Other Instructions MAINTAIN YOUR PHYSICAL ACTIVITY   PLEASE READ AND FOLLOW ATTACHED  SALTY 6   Follow-Up: At [facility], you and your health needs are our priority.  As part of our continuing mission to provide you with exceptional heart care, we have created designated Provider Care Teams.  These Care Teams include your primary Cardiologist (physician) and Advanced Practice Providers (APPs -  Physician Assistants and Nurse Practitioners) who all work together to provide you with the care you need, when you need it.  Your next appointment:   6 month(s)  Provider:   Rollene Rotunda, MD

## 2022-05-20 LAB — HEPATIC FUNCTION PANEL
ALT: 14 IU/L (ref 0–44)
AST: 15 IU/L (ref 0–40)
Albumin: 4.5 g/dL (ref 3.9–4.9)
Alkaline Phosphatase: 89 IU/L (ref 44–121)
Bilirubin Total: 0.9 mg/dL (ref 0.0–1.2)
Bilirubin, Direct: 0.21 mg/dL (ref 0.00–0.40)
Total Protein: 6.9 g/dL (ref 6.0–8.5)

## 2022-05-20 LAB — LIPID PANEL
Chol/HDL Ratio: 3 ratio (ref 0.0–5.0)
Cholesterol, Total: 126 mg/dL (ref 100–199)
HDL: 42 mg/dL (ref >39)
LDL Chol Calc (NIH): 61 mg/dL (ref 0–99)
Triglycerides: 132 mg/dL (ref 0–149)
VLDL Cholesterol Cal: 23 mg/dL (ref 5–40)

## 2022-05-22 ENCOUNTER — Telehealth: Payer: Medicare HMO | Admitting: Emergency Medicine

## 2022-05-22 DIAGNOSIS — J9611 Chronic respiratory failure with hypoxia: Secondary | ICD-10-CM

## 2022-05-22 NOTE — Telephone Encounter (Signed)
Message from [employer]:  Hebert Soho; Sharene Butters, [place]; [place], [place]; Kathe Becton Qualifications team responded with.  "please decline with referral source, s/s found and pt has Medicaid "

## 2022-05-22 NOTE — Telephone Encounter (Signed)
PT calling stating [employer] knows nothing of the order we placed for him last week. Please call PT to advise. TY  His # [phone]

## 2022-05-23 NOTE — Telephone Encounter (Signed)
Pt calling back to see why he still hasn't rec'd his order from [employer] [facility]

## 2022-05-23 NOTE — Telephone Encounter (Signed)
Pt calling again about this issue. [employer] said they do not use his ins. And the machine he needs is not in stock.  PT needs supplier that has machine and uses Medicare & [employer].  He is elevated at this point because he can not breath, he said. States he was disc from Triage.   Pls call PT @ [phone]

## 2022-05-24 NOTE — Telephone Encounter (Signed)
ATC patient x1.  LVM to return call. 

## 2022-05-24 NOTE — Telephone Encounter (Signed)
Called and spoke with patient. He confirmed that he is using 3L of O2 on his POC. His POC stopped working completely a few days ago. He no longer wants to use [employer] as his DME since they are not able to help him. I suggested [employer] to him and he would like to have the order sent to [employer] to see if they can supply him with a POC. I advised him I would go ahead and place the order. He verbalized understanding.   Order has been placed.   Nothing further needed at time of call.

## 2022-05-24 NOTE — Telephone Encounter (Signed)
Scott [name], [place]; [place], Scott [name]; Scott [name] "We can not bill Medicaid patient. Does not matter if they have family planning that does not cover DME, If we find same similar, in item is not covered by Medicaid we are working with compliance to change this policy. But for now, our company philosophy is we do not ever bill a patient with Medicaid."   Thanks!

## 2022-05-24 NOTE — Telephone Encounter (Signed)
Called and spoke with [facility] with [employer], he says the order is for a lighter portable oxygen supply.  He has medicare and Medicaid.  ID # is [id number].  He said there were not a lot of notes so he could not determine why the order was declined.  The  last note was from [facility] and it stated, placing order in void status s/s found.  ATC Melissa with [employer].  VM was full.  Community message sent to [facility] to clarify what is going on with the order.

## 2022-05-24 NOTE — Telephone Encounter (Signed)
Melissa returned call, she states that patient received POC 07/07/20 and does not qualify for a new POC with Medicare until 5 years and 3 years with Medicaid.  He has a Probation officer.  He is wanting a lighter POC, with continuous flow oxygen.  This is not something that [employer] can provide in a lighter POC.  ATC patient to clarify his liter flow and the need for continuous oxygen flow.  Will await return call.

## 2022-05-24 NOTE — Telephone Encounter (Signed)
Pt calling back to inform us that his POC is burnt out and is no longer working for him and he will like a new one. Please inform him of whether he can or can't get a new one.

## 2022-05-25 ENCOUNTER — Encounter (HOSPITAL_COMMUNITY): Payer: Self-pay

## 2022-05-25 ENCOUNTER — Ambulatory Visit (HOSPITAL_COMMUNITY)
Admission: EM | Admit: 2022-05-25 | Discharge: 2022-05-25 | Disposition: A | Discharge status: Home / Self Care | Payer: Medicare HMO | Attending: Sports Medicine | Admitting: Sports Medicine

## 2022-05-25 DIAGNOSIS — R630 Anorexia: Secondary | ICD-10-CM | POA: Diagnosis present

## 2022-05-25 DIAGNOSIS — G894 Chronic pain syndrome: Secondary | ICD-10-CM

## 2022-05-25 LAB — VITAMIN B12: Vitamin B-12: 276 pg/mL (ref 180–914)

## 2022-05-25 NOTE — ED Provider Notes (Signed)
[facility]    CSN: [id number] Arrival date & time: 05/25/22  4098      History   Chief Complaint No chief complaint on file.   HPI Scott [name] is a 62 y.o. male.   Patient is here today with chief complaint of decreased appetite, weight loss, chronic pain requesting Percocet medication.  Patient reports he has not taken any of his chronic pain medication in the past 3 weeks as his pain management provider switched him from Percocet to Suboxone.  He believes his Suboxone was poisoning him.  During our visit he pulled out a bag of multiple medications some and pill bottles some men's the block bags.  He reports he has decreased appetite and that no foods taste like anything.  He also endorses some constipation and nausea but no emesis.  Patient reports he tried to see his primary care provider today however they were unavailable but that he may have an appointment on Monday.  Patient reports he has been to the ER multiple times for issues regarding pain.  He is no longer seeing his pain management provider for the above reasons.     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   COPD (chronic obstructive pulmonary disease) (HCC)    Diabetes mellitus without complication (HCC)    GERD (gastroesophageal reflux disease)    Headache(784.0)    Heart attack (HCC)    Pneumonia    hx    Patient Active Problem List   Diagnosis Date Noted   Encounter for screening for lung cancer 07/30/2019   Chronic kidney disease 07/01/2019   Congestive heart failure (CHF) (HCC) 07/01/2019   Chronic respiratory failure with hypoxia (HCC) 07/01/2019   Hypertension 11/27/2018   Acute on chronic systolic CHF (congestive heart failure), NYHA class 3 (HCC)    Non-ischemic cardiomyopathy (HCC)    Nausea 05/27/2018   Acute respiratory failure with hypoxia (HCC) 05/26/2018   Tachycardia 03/31/2018   Chronic pain syndrome 08/29/2017    Tobacco use disorder 05/13/2015   Sebaceous cyst 01/08/2014   Nocturnal hypoxia 06/01/2013   Acute bronchitis 06/01/2013   Diabetes mellitus without complication (HCC)    GERD (gastroesophageal reflux disease)    Cervical post-laminectomy syndrome 10/02/2012   Degeneration of lumbar or lumbosacral intervertebral disc 06/01/2011   COPD (chronic obstructive pulmonary disease) (HCC) 05/30/2011   Hyperlipidemia 05/30/2011   Allergic rhinitis 05/30/2011   Cervical spondylosis with myelopathy 12/22/2010    Past Surgical History:  Procedure Laterality Date   ANTERIOR CERVICAL DECOMP/DISCECTOMY FUSION  12/22/2010   Procedure: ANTERIOR CERVICAL DECOMPRESSION/DISCECTOMY FUSION 3 LEVELS;  Surgeon: Cristi Loron;  Location: [facility];  Service: Neurosurgery;  Laterality: N/A;  Anterior cervical discectomy with fusion cervical three-four, four-five, and five sixCDF with Interbody Prosthesis, plating, and Bone Graft    ANTERIOR CERVICAL DECOMP/DISCECTOMY FUSION N/A 10/16/2012   Procedure: CERVICAL SIX-SEVEN ANTERIOR CERVICAL DECOMPRESSION/DISCECTOMY FUSION WITH INTERBODY PROTHESIS PLATING BONEGRAFT WITH /POSSIBLE HARDWARE REMOVAL OLD PLATE;  Surgeon: Cristi Loron, MD;  Location: [facility];  Service: Neurosurgery;  Laterality: N/A;   MULTIPLE TOOTH EXTRACTIONS     OTHER SURGICAL HISTORY     surgery on cheekbone and head for fall 2000   OTHER SURGICAL HISTORY     states when about 62yrs old he was urinating blood, 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 Authorizing Provider  albuterol (VENTOLIN HFA) 108 (90 Base) MCG/ACT inhaler INHALE 1 TO 2 PUFFS BY MOUTH EVERY 6 HOURS AS NEEDED FOR WHEEZING OR SHORTNESS OF BREATH 04/14/22   Leslye Peer, MD  aspirin EC 81 MG tablet Take 81 mg by mouth daily.    [provider]  bisoprolol (ZEBETA) 5 MG tablet Take 1 tablet (5 mg total) by mouth daily. 09/13/21   Joylene Grapes, NP  diazepam (VALIUM) 5 MG tablet Take 5 mg by mouth 2 (two) times daily as needed for anxiety. 08/07/18   [provider]  fluticasone (FLONASE) 50 MCG/ACT nasal spray USE 2 SPRAYS NASALLY EVERY DAY 05/25/21   Leslye Peer, MD  furosemide (LASIX) 20 MG tablet Take 1 tablet (20 mg total) by mouth every other day. 09/13/21   Joylene Grapes, NP  gabapentin (NEURONTIN) 300 MG capsule TAKE 1 CAPSULE BY MOUTH 3 TIMES DAILY 11/21/18   Lezlie Lye, Meda Coffee, MD  Ipratropium-Albuterol (COMBIVENT RESPIMAT) 20-100 MCG/ACT AERS respimat INHALE 1 PUFF BY MOUTH FOUR TIMES DAILY 04/10/22   Leslye Peer, MD  ipratropium-albuterol (DUONEB) 0.5-2.5 (3) MG/3ML SOLN Inhale 1 vial via nebulizer every 6 hours as needed 07/08/21   Leslye Peer, MD  JANUMET 50-500 MG tablet TAKE 1 TABLET BY MOUTH ONCE A DAY 07/31/18   Lezlie Lye, Meda Coffee, MD  [facility] 4 MG/0.1ML LIQD nasal spray kit Place 1 spray into the nose daily as needed (For overdose).  06/13/18   [provider]  omeprazole (PRILOSEC) 40 MG capsule TAKE 1 CAPSULE BY MOUTH EVERY DAY 01/08/19   Lezlie Lye, Meda Coffee, MD  oxyCODONE 10 MG TABS Take 1 tablet (10 mg total) by mouth every 4 (four) hours as needed for severe pain. 04/08/22   Melene Plan, DO  oxyCODONE-acetaminophen (PERCOCET) 10-325 MG tablet Take 1 tablet by mouth every 4 (four) hours. 05/28/18   Roberto Scales D, MD  rosuvastatin (CRESTOR) 10 MG tablet Take 1 tablet (10 mg total) by mouth daily. 09/13/21   Joylene Grapes, NP  sacubitril-valsartan (ENTRESTO) 97-103 MG Take 1 tablet by mouth 2 (two) times  daily. 03/20/22   Rollene Rotunda, MD  Vitamin D, Ergocalciferol, (DRISDOL) 1.25 MG (50000 UT) CAPS capsule TAKE ONE CAPSULE BY MOUTH ONCE WEEKLY 12/18/18   Lezlie Lye, Meda Coffee, MD    Family History Family History  Problem Relation Age of Onset   Emphysema Mother    COPD Mother    Asthma Brother    Diabetes Brother    Hyperlipidemia Brother    Hypertension Brother    Asthma Sister    Hyperlipidemia Sister    Hypertension Sister    Mental illness Sister    Heart disease Maternal Uncle    Hypertension Brother     Social History Social History   Tobacco Use   Smoking status: Every Day    Packs/day: 0.15    Years: 47.00    Additional pack years: 0.00    Total pack years: 7.05  Types: Cigarettes    Start date: 01/23/1973    Last attempt to quit: 12/03/2019    Years since quitting: 2.4   Smokeless tobacco: Never   Tobacco comments:    2 cigarettes smoked daily ARJ 05/17/22  Vaping Use   Vaping Use: Never used  Substance Use Topics   Alcohol use: Not Currently    Comment: Beer occasionally   Drug use: No     Allergies   Penicillins and Cymbalta [duloxetine hcl]   Review of Systems Review of Systems as listed above in HPI   Physical Exam Triage Vital Signs ED Triage Vitals  Enc Vitals Group     BP 05/25/22 0958 105/68     Pulse Rate 05/25/22 0958 93     Resp 05/25/22 0958 16     Temp 05/25/22 0958 98.3 F (36.8 C)     Temp Source 05/25/22 0958 Oral     SpO2 05/25/22 0958 92 %     Weight --      Height --      Head Circumference --      Peak Flow --      Pain Score 05/25/22 1001 7     Pain Loc --      Pain Edu? --      Excl. in GC? --    No data found.  Updated Vital Signs BP 105/68 (BP Location: Left Arm)   Pulse 93   Temp 98.3 F (36.8 C) (Oral)   Resp 16   SpO2 92%   Physical Exam Vitals reviewed.  Constitutional:      General: He is not in acute distress.    Appearance: Normal appearance. He is not ill-appearing, toxic-appearing or  diaphoretic.  Cardiovascular:     Rate and Rhythm: Normal rate and regular rhythm.     Heart sounds: No murmur heard. Pulmonary:     Effort: Pulmonary effort is normal. No respiratory distress.  Musculoskeletal:     Comments: Slightly exaggerated thoracic kyphosis  Neurological:     Mental Status: He is alert and oriented to person, place, and time.     Gait: Gait normal.      UC Treatments / Results  Labs (all labs ordered are listed, but only abnormal results are displayed) Labs Reviewed  VITAMIN B12    EKG   Radiology No results found.  Procedures Procedures (including critical care time)  Medications Ordered in UC Medications - No data to display  Initial Impression / Assessment and Plan / UC Course  I have reviewed the triage vital signs and the nursing notes.  Pertinent labs & imaging results that were available during my care of the patient were reviewed by me and considered in my medical decision making (see chart for details).     Chronic pain Patient has decided to discontinue his previously prescribed pain medications for his chronic neck pain and is requesting Percocet.  I explained to him that I am not able to provide him that medication and it would be wise for him to speak with his primary care provider to get rescheduled with a pain management provider if that is what he wishes. Decreased appetite We did check a vitamin level, B12 for him today.  Urgent care staff will call with abnormal results I recommend he start an over-the-counter multivitamin and we also discussed Ensure shakes to help with appropriate nutrition the setting of decreased appetite and not tasting much.  As he continues to be off his  pain medications his constipation may slowly reverse recommend adequate hydration.  Patient may discuss mirtazapine with his primary care provider as well  Both of these chronic conditions are best managed by primary care, patient states he may have an  appointment on Monday.  I encouraged him to keep that appointment.  Final Clinical Impressions(s) / UC Diagnoses   Final diagnoses:  Chronic pain syndrome  Decreased appetite     Discharge Instructions      Your vital signs were stable today.  I recommend you try a multivitamin but you can purchase over-the-counter and some Ensure shakes to help with your decreased appetite to make sure you are getting enough calories for the day.  There are some medications such as mirtazapine that can help with appetite however I recommend you discuss this with your primary care provider.  We checked your vitamin B12 level today.  Urgent care staff will call you with abnormal results and they will be available via MyChart    ED Prescriptions   None    PDMP not reviewed this encounter.   Claudie Leach, DO 05/25/22 1126

## 2022-05-25 NOTE — Discharge Instructions (Signed)
Your vital signs were stable today.  I recommend you try a multivitamin but you can purchase over-the-counter and some Ensure shakes to help with your decreased appetite to make sure you are getting enough calories for the day.  There are some medications such as mirtazapine that can help with appetite however I recommend you discuss this with your primary care provider.  We checked your vitamin B12 level today.  Urgent care staff will call you with abnormal results and they will be available via MyChart

## 2022-05-25 NOTE — ED Triage Notes (Signed)
Pt states he was taking percocet from pain management. States he was switched to naloxone and buprenorphine.  States 3 weeks ago he stopped taking everything and now he has no appetite and lost 30lbs.

## 2022-05-26 ENCOUNTER — Emergency Department (HOSPITAL_COMMUNITY)
Admission: EM | Admit: 2022-05-26 | Discharge: 2022-05-26 | Disposition: A | Discharge status: Home / Self Care | Payer: Medicare HMO | Attending: Emergency Medicine | Admitting: Emergency Medicine

## 2022-05-26 ENCOUNTER — Other Ambulatory Visit: Payer: Self-pay

## 2022-05-26 DIAGNOSIS — I509 Heart failure, unspecified: Secondary | ICD-10-CM | POA: Insufficient documentation

## 2022-05-26 DIAGNOSIS — I130 Hypertensive heart and chronic kidney disease with heart failure and stage 1 through stage 4 chronic kidney disease, or unspecified chronic kidney disease: Secondary | ICD-10-CM | POA: Insufficient documentation

## 2022-05-26 DIAGNOSIS — Z79899 Other long term (current) drug therapy: Secondary | ICD-10-CM | POA: Insufficient documentation

## 2022-05-26 DIAGNOSIS — R430 Anosmia: Secondary | ICD-10-CM | POA: Diagnosis not present

## 2022-05-26 DIAGNOSIS — Z7982 Long term (current) use of aspirin: Secondary | ICD-10-CM | POA: Diagnosis not present

## 2022-05-26 DIAGNOSIS — N189 Chronic kidney disease, unspecified: Secondary | ICD-10-CM | POA: Insufficient documentation

## 2022-05-26 DIAGNOSIS — G8929 Other chronic pain: Secondary | ICD-10-CM | POA: Insufficient documentation

## 2022-05-26 NOTE — ED Triage Notes (Signed)
C/o of going through withdrawals and requesting percocet medication due to losing weight with decreased appetite since meds was changed on 4/19.  Pt is seen by pain management.  Reports the he was poisoned and needs blood work done to see if he is anemic.  On 3lpm [place] baseline due to COPD and CHF

## 2022-05-26 NOTE — Discharge Instructions (Signed)
You have been seen today for your complaint of weight loss, lack of appetite. Your discharge medications include your home medications. Take them as prescribed. Home care instructions are as follows:  Eat a normal diet, even if you don't feel like eating Follow up with: your primary care provider as scheduled Please seek immediate medical care if you develop any of the following symptoms: You are not able to control when you pee or poop. You have severe back pain and: Nausea or vomiting. Pain in your chest or abdomen. Shortness of breath. You faint. At this time there does not appear to be the presence of an emergent medical condition, however there is always the potential for conditions to change. Please read and follow the below instructions.  Do not take your medicine if  develop an itchy rash, swelling in your mouth or lips, or difficulty breathing; call 911 and seek immediate emergency medical attention if this occurs.  You may review your lab tests and imaging results in their entirety on your MyChart account.  Please discuss all results of fully with your primary care provider and other specialist at your follow-up visit.  Note: Portions of this text may have been transcribed using voice recognition software. Every effort was made to ensure accuracy; however, inadvertent computerized transcription errors may still be present.

## 2022-05-26 NOTE — ED Provider Notes (Signed)
[facility] Provider Note   CSN: [id number] Arrival date & time: 05/26/22  1820     History  Chief Complaint  Patient presents with   Weight Loss   Shortness of Breath    Scott [name] is a 62 y.o. male.  With a history of chronic cervical pain due to spondylosis with myelopathy, hypertension, CKD, CHF, chronic respiratory failure with hypoxia who presents to the ED for evaluation of weight loss and anosmia.  He states he got into an altercation with his pain management physician because he believes that his physician is prescribing him the wrong medications.  Typically takes Percocet and states that his physician is prescribing him aspirin and buprenorphine instead.  This is caused him to stop taking his medications.  Since he stopped taking his medications he states that he has developed loss of appetite, anosmia and some anxiety.  He is concerned for a "mental breakdown."  He does not believe he is going through withdrawals.  He did not have any specific complaints today.  States he presented to urgent care yesterday and had some blood work done but does not know the results.  He specifically denies any chest pain, worsening of his shortness of breath, abdominal pain, nausea or vomiting.   Shortness of Breath      Home Medications Prior to Admission medications   Medication Sig Start Date End Date Taking? Authorizing Provider  albuterol (VENTOLIN HFA) 108 (90 Base) MCG/ACT inhaler INHALE 1 TO 2 PUFFS BY MOUTH EVERY 6 HOURS AS NEEDED FOR WHEEZING OR SHORTNESS OF BREATH 04/14/22   Leslye Peer, MD  aspirin EC 81 MG tablet Take 81 mg by mouth daily.    [provider]  bisoprolol (ZEBETA) 5 MG tablet Take 1 tablet (5 mg total) by mouth daily. 09/13/21   Joylene Grapes, NP  diazepam (VALIUM) 5 MG tablet Take 5 mg by mouth 2 (two) times daily as needed for anxiety. 08/07/18   [provider]  fluticasone (FLONASE) 50  MCG/ACT nasal spray USE 2 SPRAYS NASALLY EVERY DAY 05/25/21   Leslye Peer, MD  furosemide (LASIX) 20 MG tablet Take 1 tablet (20 mg total) by mouth every other day. 09/13/21   Joylene Grapes, NP  gabapentin (NEURONTIN) 300 MG capsule TAKE 1 CAPSULE BY MOUTH 3 TIMES DAILY 11/21/18   Lezlie Lye, Meda Coffee, MD  Ipratropium-Albuterol (COMBIVENT RESPIMAT) 20-100 MCG/ACT AERS respimat INHALE 1 PUFF BY MOUTH FOUR TIMES DAILY 04/10/22   Leslye Peer, MD  ipratropium-albuterol (DUONEB) 0.5-2.5 (3) MG/3ML SOLN Inhale 1 vial via nebulizer every 6 hours as needed 07/08/21   Leslye Peer, MD  JANUMET 50-500 MG tablet TAKE 1 TABLET BY MOUTH ONCE A DAY 07/31/18   Lezlie Lye, Meda Coffee, MD  [facility] 4 MG/0.1ML LIQD nasal spray kit Place 1 spray into the nose daily as needed (For overdose).  06/13/18   [provider]  omeprazole (PRILOSEC) 40 MG capsule TAKE 1 CAPSULE BY MOUTH EVERY DAY 01/08/19   Lezlie Lye, Meda Coffee, MD  oxyCODONE 10 MG TABS Take 1 tablet (10 mg total) by mouth every 4 (four) hours as needed for severe pain. 04/08/22   Melene Plan, DO  oxyCODONE-acetaminophen (PERCOCET) 10-325 MG tablet Take 1 tablet by mouth every 4 (four) hours. 05/28/18   Roberto Scales D, MD  rosuvastatin (CRESTOR) 10 MG tablet Take 1 tablet (10 mg total) by mouth daily. 09/13/21   Bernadene Person  C, NP  sacubitril-valsartan (ENTRESTO) 97-103 MG Take 1 tablet by mouth 2 (two) times daily. 03/20/22   Rollene Rotunda, MD  Vitamin D, Ergocalciferol, (DRISDOL) 1.25 MG (50000 UT) CAPS capsule TAKE ONE CAPSULE BY MOUTH ONCE WEEKLY 12/18/18   Lezlie Lye, Meda Coffee, MD      Allergies    Penicillins and Cymbalta [duloxetine hcl]    Review of Systems   Review of Systems  Constitutional:  Positive for appetite change.  All other systems reviewed and are negative.   Physical Exam Updated Vital Signs BP 116/82 (BP Location: Right Arm)   Pulse 81   Temp 98.1 F (36.7 C) (Oral)   Resp 19   Wt 77.1 kg   SpO2 96%   BMI 25.85  kg/m  Physical Exam Vitals and nursing note reviewed.  Constitutional:      General: He is not in acute distress.    Appearance: He is well-developed. He is not ill-appearing, toxic-appearing or diaphoretic.     Comments: Resting comfortably in chair  HENT:     Head: Normocephalic and atraumatic.  Eyes:     Conjunctiva/sclera: Conjunctivae normal.  Cardiovascular:     Rate and Rhythm: Normal rate and regular rhythm.     Heart sounds: No murmur heard. Pulmonary:     Effort: Pulmonary effort is normal. No respiratory distress.     Breath sounds: Decreased breath sounds present. No wheezing, rhonchi or rales.  Abdominal:     Palpations: Abdomen is soft.     Tenderness: There is no abdominal tenderness.  Musculoskeletal:        General: No swelling.     Cervical back: Neck supple.  Skin:    General: Skin is warm and dry.     Capillary Refill: Capillary refill takes less than 2 seconds.  Neurological:     Mental Status: He is alert.  Psychiatric:        Mood and Affect: Mood normal.        Behavior: Behavior normal.     ED Results / Procedures / Treatments   Labs (all labs ordered are listed, but only abnormal results are displayed) Labs Reviewed - No data to display  EKG None  Radiology No results found.  Procedures Procedures    Medications Ordered in ED Medications - No data to display  ED Course/ Medical Decision Making/ A&P                             Medical Decision Making This patient presents to the ED for concern of weight loss, anosmia, this involves an extensive number of treatment options.  Differential diagnosis includes psychiatric causes, medication changes  Co morbidities that complicate the patient evaluation  chronic cervical pain due to spondylosis with myelopathy, hypertension, CKD, CHF, chronic respiratory failure with hypoxia  Additional history obtained from: Nursing notes from this visit.  Afebrile, hemodynamically stable.   62 year old male presenting to the ED for evaluation of concern for weight loss, appetite change and anosmia.  Believes it is due to his medication changes as he has stopped taking his opioid pain control due to fear of poisoning from his pain management physician.  He appears overall very well.  He has no acute concerns but rather would like to know if he is in a mental breakdown at this moment.  He appears overall very well and is not in any acute psychosis.  He is fully alert and  oriented.  He is not in any distress.  He states that he does have follow-up with his new primary care physician as well as a new pain management physician coming up relatively soon.  Labs that were drawn yesterday urgent care were discussed with patient while in the room.  [facility]  He was encouraged to keep this appointment as scheduled and bring up these concerns with his primary care provider.  He was also encouraged to take his home prescriptions as prescribed.  He is agreeable to this plan and vocalizes relief.  He was given return precautions.  Stable at discharge.  At this time there does not appear to be any evidence of an acute emergency medical condition and the patient appears stable for discharge with appropriate outpatient follow up. Diagnosis was discussed with patient who verbalizes understanding of care plan and is agreeable to discharge. I have discussed return precautions with patient who verbalizes understanding. Patient encouraged to follow-up with their PCP within 1 week. All questions answered.  Note: Portions of this report may have been transcribed using voice recognition software. Every effort was made to ensure accuracy; however, inadvertent computerized transcription errors may still be present.        Final Clinical Impression(s) / ED Diagnoses Final diagnoses:  Other chronic pain  Anosmia    Rx / DC Orders ED  Discharge Orders     None         Michelle Piper, Cordelia Poche 05/26/22 1946    Pricilla Loveless, MD 05/26/22 2221

## 2022-06-06 ENCOUNTER — Other Ambulatory Visit: Payer: Self-pay | Admitting: Emergency Medicine

## 2022-06-06 DIAGNOSIS — J449 Chronic obstructive pulmonary disease, unspecified: Secondary | ICD-10-CM

## 2022-07-04 ENCOUNTER — Other Ambulatory Visit: Payer: Self-pay | Admitting: Physical Medicine & Rehabilitation

## 2022-07-04 DIAGNOSIS — M5412 Radiculopathy, cervical region: Secondary | ICD-10-CM

## 2022-07-04 DIAGNOSIS — M5416 Radiculopathy, lumbar region: Secondary | ICD-10-CM

## 2022-07-11 ENCOUNTER — Telehealth: Payer: Self-pay | Admitting: Emergency Medicine

## 2022-07-11 NOTE — Telephone Encounter (Signed)
Pt is here in the lobby, in the mornings he says he thinks he is going to die, but he is saying he think he has lack of oxygen to his brain, if he is still your oxygen is at 90-93 but if he moving around it drops to 86-87 he is thinking he will die

## 2022-07-11 NOTE — Telephone Encounter (Signed)
I went to lobby and brought pt back to exam room  Pt concerned that his o2 sats drop to 87% on RA with exertion  I explained that this is why it's important for him to use his supplemental o2 as instructed with exertion  He verbalized understanding  Sats on o2 are always above 90%  He denied any CP, fever, wheezing, cough or other acute symptoms  He kept asking about what his life expectancy is  I advised this should be addressed by Dr Delton Coombes at his upcoming visit  I encouraged him to write down any questions he wants to ask so that everything is addressed at the next visit  I did offer to pt to call EMS or to have him seek emergent care today, since no appt available and he refused  He understands to seek emergent care if his symptoms persist or worsen

## 2022-07-14 ENCOUNTER — Encounter: Payer: Self-pay | Admitting: Family Medicine

## 2022-07-14 ENCOUNTER — Ambulatory Visit (INDEPENDENT_AMBULATORY_CARE_PROVIDER_SITE_OTHER): Payer: Medicare HMO | Admitting: Family Medicine

## 2022-07-14 VITALS — BP 124/82 | HR 95 | Temp 97.1°F | Ht 67.5 in | Wt 182.2 lb

## 2022-07-14 DIAGNOSIS — I5022 Chronic systolic (congestive) heart failure: Secondary | ICD-10-CM

## 2022-07-14 DIAGNOSIS — K219 Gastro-esophageal reflux disease without esophagitis: Secondary | ICD-10-CM | POA: Diagnosis not present

## 2022-07-14 DIAGNOSIS — F22 Delusional disorders: Secondary | ICD-10-CM

## 2022-07-14 DIAGNOSIS — F1120 Opioid dependence, uncomplicated: Secondary | ICD-10-CM | POA: Insufficient documentation

## 2022-07-14 DIAGNOSIS — E119 Type 2 diabetes mellitus without complications: Secondary | ICD-10-CM

## 2022-07-14 DIAGNOSIS — I428 Other cardiomyopathies: Secondary | ICD-10-CM | POA: Diagnosis not present

## 2022-07-14 DIAGNOSIS — N189 Chronic kidney disease, unspecified: Secondary | ICD-10-CM

## 2022-07-14 DIAGNOSIS — I10 Essential (primary) hypertension: Secondary | ICD-10-CM | POA: Diagnosis not present

## 2022-07-14 DIAGNOSIS — G894 Chronic pain syndrome: Secondary | ICD-10-CM

## 2022-07-14 DIAGNOSIS — Z7984 Long term (current) use of oral hypoglycemic drugs: Secondary | ICD-10-CM

## 2022-07-14 DIAGNOSIS — J439 Emphysema, unspecified: Secondary | ICD-10-CM

## 2022-07-14 DIAGNOSIS — F2081 Schizophreniform disorder: Secondary | ICD-10-CM

## 2022-07-14 NOTE — Assessment & Plan Note (Signed)
Monitor kidney function through regular labs.

## 2022-07-14 NOTE — Assessment & Plan Note (Signed)
Follow up with pain management specialist. Discontinue diazepam, oxyCODONE, and oxyCODONE-acetaminophen (Percocet). Consider alternative pain management strategies, including non-opioid medications.

## 2022-07-14 NOTE — Assessment & Plan Note (Signed)
History of opioid abuse leading to severe disorder, multiple ER visits, and significant distress.   Plan:  Provide supportive counseling. Reinforce discontinuation of illicit opioid use. Consider enrollment in a substance abuse rehabilitation program.

## 2022-07-14 NOTE — Assessment & Plan Note (Signed)
Continue omeprazole 40 mg daily. Monitor for symptom control.

## 2022-07-14 NOTE — Assessment & Plan Note (Signed)
Continue bisoprolol and Entresto Monitor blood pressure at home. Schedule regular follow-ups.

## 2022-07-14 NOTE — Assessment & Plan Note (Signed)
Delusions of persecution and behavioral instability affecting care.   Differential Diagnosis:  Schizophreniform Disorder Substance-Induced Psychotic Disorder  Plan:  Continue current psychiatric care. Schedule follow-up with psychiatrist. Reinforce medication compliance.

## 2022-07-14 NOTE — Assessment & Plan Note (Signed)
Diabetes managed with Janumet, recent HbA1c 6.1% from outside clinic  Plan:  Continue Janumet. Schedule regular blood glucose monitoring. Reinforce lifestyle modifications.

## 2022-07-14 NOTE — Assessment & Plan Note (Signed)
Continue Combivent, DuoNeb, fluticasone nasal spray. Monitor for exacerbations. Encourage continued smoking cessation. Schedule pulmonology follow-up.

## 2022-07-14 NOTE — Assessment & Plan Note (Signed)
Chronic hypoxemic respiratory failure secondary to a long history of tobacco use and severe COPD.   Plan:  Reinforce smoking cessation. Review current CHF management with cardiologist. Monitor oxygen use compliance. Schedule follow-up with cardiology.

## 2022-07-14 NOTE — Progress Notes (Signed)
Assessment/Plan:   Problem List Items Addressed This Visit       Cardiovascular and Mediastinum   Non-ischemic cardiomyopathy (HCC)   Hypertension    Continue bisoprolol and Entresto Monitor blood pressure at home. Schedule regular follow-ups.      Congestive heart failure (CHF) (HCC) - Primary    Chronic hypoxemic respiratory failure secondary to a long history of tobacco use and severe COPD.   Plan:  Reinforce smoking cessation. Review current CHF management with cardiologist. Monitor oxygen use compliance. Schedule follow-up with cardiology.        Respiratory   COPD (chronic obstructive pulmonary disease) (HCC)    Continue Combivent, DuoNeb, fluticasone nasal spray. Monitor for exacerbations. Encourage continued smoking cessation. Schedule pulmonology follow-up.        Digestive   GERD (gastroesophageal reflux disease)    Continue omeprazole 40 mg daily. Monitor for symptom control.        Endocrine   Diabetes mellitus without complication (HCC)    Diabetes managed with Janumet, recent HbA1c 6.1% from outside clinic  Plan:  Continue Janumet. Schedule regular blood glucose monitoring. Reinforce lifestyle modifications.        Genitourinary   Chronic kidney disease    Monitor kidney function through regular labs.        Other   Chronic pain syndrome    Follow up with pain management specialist. Discontinue diazepam, oxyCODONE, and oxyCODONE-acetaminophen (Percocet). Consider alternative pain management strategies, including non-opioid medications.      Relevant Medications   baclofen (LIORESAL) 10 MG tablet   Severe opioid use disorder (HCC)    History of opioid abuse leading to severe disorder, multiple ER visits, and significant distress.   Plan:  Provide supportive counseling. Reinforce discontinuation of illicit opioid use. Consider enrollment in a substance abuse rehabilitation program.      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   Differential Diagnosis:  Schizophreniform Disorder Substance-Induced Psychotic Disorder  Plan:  Continue current psychiatric care. Schedule follow-up with psychiatrist. Reinforce medication compliance.      Schizophreniform disorder (HCC)    Medications Discontinued During This Encounter  Medication Reason   diazepam (VALIUM) 5 MG tablet    oxyCODONE 10 MG TABS    oxyCODONE-acetaminophen (PERCOCET) 10-325 MG tablet     No follow-ups on file.    Subjective:   Encounter date: 07/14/2022  PARTICK MUSSELMAN is a 62 y.o. male who has Cervical spondylosis with myelopathy; COPD (chronic obstructive pulmonary disease) (HCC); Hyperlipidemia; Allergic rhinitis; Degeneration of lumbar or lumbosacral intervertebral disc; Cervical post-laminectomy syndrome; Nocturnal hypoxia; Diabetes mellitus without complication (HCC); GERD (gastroesophageal reflux disease); Acute bronchitis; Sebaceous cyst; Tobacco use disorder; Chronic pain syndrome; Tachycardia; Acute respiratory failure with hypoxia (HCC); Nausea; Acute on chronic systolic CHF (congestive heart failure), NYHA class 3 (HCC); Non-ischemic cardiomyopathy (HCC); Hypertension; Chronic kidney disease; Congestive heart failure (CHF) (HCC); Chronic respiratory failure with hypoxia (HCC); Encounter for screening for lung cancer; Severe opioid use disorder (HCC); Paranoid delusion (HCC); and Schizophreniform disorder (HCC) on their problem list..   He  has a past medical history of Arthritis, COPD (chronic obstructive pulmonary disease) (HCC), Diabetes mellitus without complication (HCC), GERD (gastroesophageal reflux disease), Headache(784.0), Heart attack (HCC), and Pneumonia..   Chief Complaint: Establish care and address chronic pain management, along with a detailed past medical and family history review.  History of Present Illness:  Chronic Pain Management: Greig Right, a  62 year old male, presents for  an initial consultation to establish care and follow-up on chronic pain management. His pain management began six years ago with Percocet under Dr. August Saucer at [facility]. After [facility] closed, he transitioned to [facility], experiencing multiple changes in his opioid regimen, leading to a significant distrust and anxiety regarding his treatment. His most recent treatment involved Suboxone, which led to adverse mental effects including hypervigilance, muscle tension, and delusions of persecution. Mr. Hocker has been non-compliant with Suboxone due to its ineffectiveness and adverse reactions, describing the experience as "going crazy." On April 01, 2022, his medication was switched from Percocet to oxycodone 15 mg, which he claims was fraudulent. This led to medication cessation at various times, multiple emergency room visits, and significant emotional and physical distress. On April 27, 2022, an incident with Dr. [place], where he believed he received buprenorphine instead of Percocet, led him to disengage from [facility] and seek new primary care.  Psychosocial and Behavioral Health Concerns: Psychosocial concerns for Mr. Gist include feelings of isolation and strained relationships owing to perceived persecution by medical providers, aggravating his psychological and physical health issues. He has underlying mental health issues, including delusions of persecution concerning various healthcare professionals, resulting in recent behavioral instability requiring emergency interventions. Additionally, Mr. Paternoster has a history of opioid abuse concerns and seeks comprehensive management and stabilization of his multifaceted medical and psychiatric conditions.  Lung Health and Smoking Cessation: Mr. Istre has a history of COPD and follows with [facility]. He been participating in a lung cancer screening program. His most recent CT scan on April 10, 2022, showed moderate centrilobular and  paraseptal emphysema and stable pulmonary nodules.  He has been working on smoking cessation with intermittent periods of abstinence followed by relapse. He currently smokes two cigarettes daily. He has used nicotine patches and nicotine gum. COPD is managed with Combivent and DuoNeb as needed, and fluticasone nasal spray. He reports no recent COPD exacerbations. He continues to use Combivent and albuterol three to four times daily. Despite multiple ED visits for pain control and chest pain, he feels his breathing is stable.   Heart Health:He has chronic systolic congestive heart failure (CHF), nonischemic cardiomyopathy, marked by an ejection fraction recovery to 60-65% as seen on an echocardiogram. He has a history of cardiac catheterization which showed no evidence of coronary artery disease (CAD) but indicated elevated filling pressures. Shave also suffers from hypertension, hyperlipidemia, type 2 diabetes, and recurrent episodes of tachycardia. He underwent several cardiac procedures, including an anterior cervical decompression and discectomy fusion, and right/left heart catheterization.  Review of Systems  Constitutional:  Negative for chills, diaphoresis, fever, malaise/fatigue and weight loss.  HENT:  Positive for congestion. Negative for ear discharge, ear pain and hearing loss.   Eyes:  Negative for blurred vision, double vision, photophobia, pain, discharge and redness.  Respiratory:  Positive for cough. Negative for sputum production, shortness of breath and wheezing.   Cardiovascular:  Negative for chest pain and palpitations.  Gastrointestinal:  Positive for heartburn. Negative for abdominal pain, blood in stool, constipation, diarrhea, melena, nausea and vomiting.  Genitourinary:  Negative for dysuria, flank pain, frequency, hematuria and urgency.  Musculoskeletal:  Positive for back pain and neck pain. Negative for myalgias.  Skin:  Negative for itching and rash.  Neurological:   Negative for dizziness, tingling, tremors, speech change, seizures, loss of consciousness, weakness and headaches.  Endo/Heme/Allergies:  Positive for environmental allergies. Negative for polydipsia.  Psychiatric/Behavioral:  Positive for depression, hallucinations and substance abuse ("Using percocet on the  street"). Negative for memory loss and suicidal ideas. The patient does not have insomnia.   All other systems reviewed and are negative.   Past Surgical History:  Procedure Laterality Date   ANTERIOR CERVICAL DECOMP/DISCECTOMY FUSION  12/22/2010   Procedure: ANTERIOR CERVICAL DECOMPRESSION/DISCECTOMY FUSION 3 LEVELS;  Surgeon: Cristi Loron;  Location: [facility];  Service: Neurosurgery;  Laterality: N/A;  Anterior cervical discectomy with fusion cervical three-four, four-five, and five sixCDF with Interbody Prosthesis, plating, and Bone Graft    ANTERIOR CERVICAL DECOMP/DISCECTOMY FUSION N/A 10/16/2012   Procedure: CERVICAL SIX-SEVEN ANTERIOR CERVICAL DECOMPRESSION/DISCECTOMY FUSION WITH INTERBODY PROTHESIS PLATING BONEGRAFT WITH /POSSIBLE HARDWARE REMOVAL OLD PLATE;  Surgeon: Cristi Loron, MD;  Location: [facility];  Service: Neurosurgery;  Laterality: N/A;   MULTIPLE TOOTH EXTRACTIONS     OTHER SURGICAL HISTORY     surgery on cheekbone and head for fall 2000   OTHER SURGICAL HISTORY     states when about 62yrs old he was urinating blood, 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     baclofen (LIORESAL) 10 MG  tablet Take 10 mg by mouth 2 (two) times daily.     bisoprolol (ZEBETA) 5 MG tablet Take 1 tablet (5 mg total) by mouth daily. 90 tablet 3   fluticasone (FLONASE) 50 MCG/ACT nasal spray USE 2 SPRAYS NASALLY EVERY DAY 48 g 3   furosemide (LASIX) 20 MG tablet Take 1 tablet (20 mg total) by mouth every other day. 45 tablet 3   gabapentin (NEURONTIN) 300 MG capsule TAKE 1 CAPSULE BY MOUTH 3 TIMES DAILY 90 capsule 0   Ipratropium-Albuterol (COMBIVENT RESPIMAT) 20-100 MCG/ACT AERS respimat INHALE 1 PUFF BY MOUTH FOUR TIMES DAILY 12 g 3   ipratropium-albuterol (DUONEB) 0.5-2.5 (3) MG/3ML SOLN Inhale 1 vial via nebulizer every 6 hours as needed 1080 mL 3   JANUMET 50-500 MG tablet TAKE 1 TABLET BY MOUTH ONCE A DAY 90 tablet 0   NARCAN 4 MG/0.1ML LIQD nasal spray kit Place 1 spray into the nose daily as needed (For overdose).      omeprazole (PRILOSEC) 40 MG capsule TAKE 1 CAPSULE BY MOUTH EVERY DAY 30 capsule 0   rosuvastatin (CRESTOR) 10 MG tablet Take 1 tablet (10 mg total) by mouth daily. 90 tablet 3   sacubitril-valsartan (ENTRESTO) 97-103 MG Take 1 tablet by mouth 2 (two) times daily. 60 tablet 11   VENTOLIN HFA 108 (90 Base) MCG/ACT inhaler INHALE 1 TO 2 PUFFS BY MOUTH EVERY 6 HOURS AS NEEDED FOR WHEEZING OR SHORTNESS OF BREATH 18 g 1   Vitamin D, Ergocalciferol, (DRISDOL) 1.25 MG (50000 UT) CAPS capsule TAKE ONE CAPSULE BY MOUTH ONCE WEEKLY 12 capsule 0   diazepam (VALIUM) 5 MG tablet Take 5 mg by mouth 2 (two) times daily as needed for anxiety. (Patient not taking: Reported on 07/14/2022)     oxyCODONE 10 MG TABS Take 1 tablet (10 mg total) by mouth every 4 (four) hours  as needed for severe pain. (Patient not taking: Reported on 07/14/2022) 14 tablet 0   oxyCODONE-acetaminophen (PERCOCET) 10-325 MG tablet Take 1 tablet by mouth every 4 (four) hours. (Patient not taking: Reported on 07/14/2022) 15 tablet 0   No facility-administered medications prior to visit.    Family History  Problem Relation  Age of Onset   Emphysema Mother    COPD Mother    Asthma Brother    Diabetes Brother    Hyperlipidemia Brother    Hypertension Brother    Asthma Sister    Hyperlipidemia Sister    Hypertension Sister    Mental illness Sister    Heart disease Maternal Uncle    Hypertension Brother     Social History   Socioeconomic History   Marital status: Married    Spouse name: Not on file   Number of children: 4   Years of education: Not on file   Highest education level: Not on file  Occupational History   Occupation: DISABLED  Tobacco Use   Smoking status: Every Day    Packs/day: 0.15    Years: 47.00    Additional pack years: 0.00    Total pack years: 7.05    Types: Cigarettes    Start date: 01/23/1973    Last attempt to quit: 12/03/2019    Years since quitting: 2.6   Smokeless tobacco: Never   Tobacco comments:    2 cigarettes smoked daily [facility] 05/17/22  Vaping Use   Vaping Use: Never used  Substance and Sexual Activity   Alcohol use: Not Currently    Comment: Beer occasionally   Drug use: No   Sexual activity: Not Currently  Other Topics Concern   Not on file  Social History Narrative   Not on file   Social Determinants of Health   Financial Resource Strain: Not on file  Food Insecurity: Not on file  Transportation Needs: No Transportation Needs (11/05/2018)   PRAPARE - Administrator, Civil Service (Medical): No    Lack of Transportation (Non-Medical): No  Physical Activity: Not on file  Stress: Not on file  Social Connections: Not on file  Intimate Partner Violence: Not on file                                                                                                  Objective:  Physical Exam: BP 124/82 (BP Location: Left Arm, Patient Position: Sitting, Cuff Size: Large)   Pulse 95   Temp (!) 97.1 F (36.2 C) (Temporal)   Ht 5' 7.5" (1.715 m)   Wt 182 lb 3.2 oz (82.6 kg)   SpO2 92%   BMI 28.12 kg/m     Physical Exam Constitutional:       Appearance: Normal appearance.  HENT:     Head: Normocephalic and atraumatic.     Right Ear: Hearing normal.     Left Ear: Hearing normal.     Nose: Nose normal.  Eyes:     General: No scleral icterus.       Right eye: No discharge.  Left eye: No discharge.     Extraocular Movements: Extraocular movements intact.  Cardiovascular:     Rate and Rhythm: Normal rate and regular rhythm.     Heart sounds: Normal heart sounds.  Pulmonary:     Effort: Pulmonary effort is normal.     Breath sounds: Normal breath sounds.  Abdominal:     Palpations: Abdomen is soft.     Tenderness: There is no abdominal tenderness.  Skin:    General: Skin is warm.     Findings: No rash.  Neurological:     General: No focal deficit present.     Mental Status: He is alert.     Cranial Nerves: No cranial nerve deficit.  Psychiatric:        Attention and Perception: Attention normal.        Speech: Speech is tangential.        Behavior: Behavior is hyperactive. Behavior is cooperative.        Thought Content: Thought content is paranoid and delusional. Thought content does not include homicidal or suicidal ideation.        Judgment: Judgment normal.     DG Chest 2 View  Result Date: 04/22/2022 CLINICAL DATA:  chest pain EXAM: CHEST - 2 VIEW COMPARISON:  04/05/2022 FINDINGS: Lungs are clear, hyperinflated. Heart size and mediastinal contours are within normal limits. Aortic Atherosclerosis (ICD10-170.0). No effusion. Cervical fixation hardware stable. IMPRESSION: No acute cardiopulmonary disease. Electronically Signed   By: Corlis Leak M.D.   On: 04/22/2022 11:22    Recent Results (from the past 2160 hour(s))  Basic metabolic panel     Status: Abnormal   Collection Time: 04/22/22 10:57 AM  Result Value Ref Range   Sodium 140 135 - 145 mmol/L   Potassium 4.1 3.5 - 5.1 mmol/L   Chloride 103 98 - 111 mmol/L   CO2 25 22 - 32 mmol/L   Glucose, Bld 123 (H) 70 - 99 mg/dL    Comment: Glucose  reference range applies only to samples taken after fasting for at least 8 hours.   BUN 8 8 - 23 mg/dL   Creatinine, Ser 4.09 0.61 - 1.24 mg/dL   Calcium 9.4 8.9 - 81.1 mg/dL   GFR, Estimated >91 >47 mL/min    Comment: (NOTE) Calculated using the CKD-EPI Creatinine Equation (2021)    Anion gap 12 5 - 15    Comment: Performed at [facility], [address], [place], [place] [address]  CBC     Status: None   Collection Time: 04/22/22 10:57 AM  Result Value Ref Range   WBC 7.7 4.0 - 10.5 K/uL   RBC 5.04 4.22 - 5.81 MIL/uL   Hemoglobin 14.5 13.0 - 17.0 g/dL   HCT 21.3 08.6 - 57.8 %   MCV 86.3 80.0 - 100.0 fL   MCH 28.8 26.0 - 34.0 pg   MCHC 33.3 30.0 - 36.0 g/dL   RDW 46.9 62.9 - 52.8 %   Platelets 250 150 - 400 K/uL   nRBC 0.0 0.0 - 0.2 %    Comment: Performed at [facility], [address] [place], [place] [address]  Troponin I (High Sensitivity)     Status: None   Collection Time: 04/22/22 10:57 AM  Result Value Ref Range   Troponin I (High Sensitivity) 5 <18 ng/L    Comment: (NOTE) Elevated high sensitivity troponin I (hsTnI) values and significant  changes across serial measurements may suggest ACS but many other  chronic and acute conditions are known to elevate hsTnI results.  Refer to the "Links" section for chest pain algorithms and additional  guidance. Performed at [facility], [address], [place], [place] [address]   Resp panel by RT-PCR (RSV, Flu A&B, Covid) Anterior Nasal Swab     Status: None   Collection Time: 04/30/22  1:56 PM   Specimen: Anterior Nasal Swab  Result Value Ref Range   SARS Coronavirus 2 by RT PCR NEGATIVE NEGATIVE    Comment: (NOTE) SARS-CoV-2 target nucleic acids are NOT DETECTED.  The SARS-CoV-2 RNA is generally detectable in upper respiratory specimens during the acute phase of infection. The lowest concentration of SARS-CoV-2 viral copies this assay can detect is 138 copies/mL. A negative result does not  preclude SARS-Cov-2 infection and should not be used as the sole basis for treatment or other patient management decisions. A negative result may occur with  improper specimen collection/handling, submission of specimen other than nasopharyngeal swab, presence of viral mutation(s) within the areas targeted by this assay, and inadequate number of viral copies(<138 copies/mL). A negative result must be combined with clinical observations, patient history, and epidemiological information. The expected result is Negative.  Fact Sheet for Patients:  BloggerCourse.com  Fact Sheet for Healthcare Providers:  SeriousBroker.it  This test is no t yet approved or cleared by the [place] FDA and  has been authorized for detection and/or diagnosis of SARS-CoV-2 by FDA under an Emergency Use Authorization (EUA). This EUA will remain  in effect (meaning this test can be used) for the duration of the COVID-19 declaration under Section 564(b)(1) of the Act, 21 U.S.C.section 360bbb-3(b)(1), unless the authorization is terminated  or revoked sooner.       Influenza A by PCR NEGATIVE NEGATIVE   Influenza B by PCR NEGATIVE NEGATIVE    Comment: (NOTE) The Xpert Xpress SARS-CoV-2/FLU/RSV plus assay is intended as an aid in the diagnosis of influenza from Nasopharyngeal swab specimens and should not be used as a sole basis for treatment. Nasal washings and aspirates are unacceptable for Xpert Xpress SARS-CoV-2/FLU/RSV testing.  Fact Sheet for Patients: BloggerCourse.com  Fact Sheet for Healthcare Providers: SeriousBroker.it  This test is not yet approved or cleared by the [place] FDA and has been authorized for detection and/or diagnosis of SARS-CoV-2 by FDA under an Emergency Use Authorization (EUA). This EUA will remain in effect (meaning this test can be used) for the duration of  the COVID-19 declaration under Section 564(b)(1) of the Act, 21 U.S.C. section 360bbb-3(b)(1), unless the authorization is terminated or revoked.     Resp Syncytial Virus by PCR NEGATIVE NEGATIVE    Comment: (NOTE) Fact Sheet for Patients: BloggerCourse.com  Fact Sheet for Healthcare Providers: SeriousBroker.it  This test is not yet approved or cleared by the [place] FDA and has been authorized for detection and/or diagnosis of SARS-CoV-2 by FDA under an Emergency Use Authorization (EUA). This EUA will remain in effect (meaning this test can be used) for the duration of the COVID-19 declaration under Section 564(b)(1) of the Act, 21 U.S.C. section 360bbb-3(b)(1), unless the authorization is terminated or revoked.  Performed at [facility], [address] [place], [place] [address]   Comprehensive metabolic panel     Status: Abnormal   Collection Time: 05/07/22 12:51 AM  Result Value Ref Range   Sodium 141 135 - 145 mmol/L   Potassium 4.0 3.5 - 5.1 mmol/L   Chloride 106 98 - 111 mmol/L  CO2 29 22 - 32 mmol/L   Glucose, Bld 102 (H) 70 - 99 mg/dL    Comment: Glucose reference range applies only to samples taken after fasting for at least 8 hours.   BUN 14 8 - 23 mg/dL   Creatinine, Ser 1.61 0.61 - 1.24 mg/dL   Calcium 8.9 8.9 - 09.6 mg/dL   Total Protein 6.8 6.5 - 8.1 g/dL   Albumin 4.0 3.5 - 5.0 g/dL   AST 14 (L) 15 - 41 U/L   ALT 14 0 - 44 U/L   Alkaline Phosphatase 60 38 - 126 U/L   Total Bilirubin 0.8 0.3 - 1.2 mg/dL   GFR, Estimated >04 >54 mL/min    Comment: (NOTE) Calculated using the CKD-EPI Creatinine Equation (2021)    Anion gap 6 5 - 15    Comment: Performed at [facility], [address], [place], [place] [address]  Ethanol     Status: None   Collection Time: 05/07/22 12:51 AM  Result Value Ref Range   Alcohol, Ethyl (B) <10 <10 mg/dL    Comment:  (NOTE) Lowest detectable limit for serum alcohol is 10 mg/dL.  For medical purposes only. Performed at [facility], [address], [place], [place] [address]   Urine rapid drug screen (hosp performed)     Status: Abnormal   Collection Time: 05/07/22 12:51 AM  Result Value Ref Range   Opiates POSITIVE (A) NONE DETECTED   Cocaine NONE DETECTED NONE DETECTED   Benzodiazepines POSITIVE (A) NONE DETECTED   Amphetamines NONE DETECTED NONE DETECTED   Tetrahydrocannabinol NONE DETECTED NONE DETECTED   Barbiturates NONE DETECTED NONE DETECTED    Comment: (NOTE) DRUG SCREEN FOR MEDICAL PURPOSES ONLY.  IF CONFIRMATION IS NEEDED FOR ANY PURPOSE, NOTIFY LAB WITHIN 5 DAYS.  LOWEST DETECTABLE LIMITS FOR URINE DRUG SCREEN Drug Class                     Cutoff (ng/mL) Amphetamine and metabolites    1000 Barbiturate and metabolites    200 Benzodiazepine                 200 Opiates and metabolites        300 Cocaine and metabolites        300 THC                            50 Performed at [facility], [address], [place], [place] [address]   CBC with Diff     Status: None   Collection Time: 05/07/22 12:51 AM  Result Value Ref Range   WBC 7.2 4.0 - 10.5 K/uL   RBC 4.73 4.22 - 5.81 MIL/uL   Hemoglobin 13.4 13.0 - 17.0 g/dL   HCT 13.0 86.5 - 78.4 %   MCV 89.4 80.0 - 100.0 fL   MCH 28.3 26.0 - 34.0 pg   MCHC 31.7 30.0 - 36.0 g/dL   RDW 69.6 29.5 - 28.4 %   Platelets 184 150 - 400 K/uL   nRBC 0.0 0.0 - 0.2 %   Neutrophils Relative % 60 %   Neutro Abs 4.3 1.7 - 7.7 K/uL   Lymphocytes Relative 30 %   Lymphs Abs 2.1 0.7 - 4.0 K/uL   Monocytes Relative 9 %   Monocytes Absolute 0.6 0.1 - 1.0 K/uL   Eosinophils Relative 1 %   Eosinophils Absolute 0.1 0.0 -  0.5 K/uL   Basophils Relative 0 %   Basophils Absolute 0.0 0.0 - 0.1 K/uL   Immature Granulocytes 0 %   Abs Immature Granulocytes 0.02 0.00 - 0.07 K/uL    Comment: Performed at [facility], [address] [place], [place] [address]  Lipid panel     Status: None   Collection Time: 05/19/22  8:36 AM  Result Value Ref Range   Cholesterol, Total 126 100 - 199 mg/dL   Triglycerides 716 0 - 149 mg/dL   HDL 42 >96 mg/dL   VLDL Cholesterol Cal 23 5 - 40 mg/dL   LDL Chol Calc (NIH) 61 0 - 99 mg/dL   Chol/HDL Ratio 3.0 0.0 - 5.0 ratio    Comment:                                   T. Chol/HDL Ratio                                             Men  Women                               1/2 Avg.Risk  3.4    3.3                                   Avg.Risk  5.0    4.4                                2X Avg.Risk  9.6    7.1                                3X Avg.Risk 23.4   11.0   Hepatic function panel     Status: None   Collection Time: 05/19/22  8:36 AM  Result Value Ref Range   Total Protein 6.9 6.0 - 8.5 g/dL   Albumin 4.5 3.9 - 4.9 g/dL   Bilirubin Total 0.9 0.0 - 1.2 mg/dL   Bilirubin, Direct 7.89 0.00 - 0.40 mg/dL   Alkaline Phosphatase 89 44 - 121 IU/L   AST 15 0 - 40 IU/L   ALT 14 0 - 44 IU/L  Vitamin B12     Status: None   Collection Time: 05/25/22 10:47 AM  Result Value Ref Range   Vitamin B-12 276 180 - 914 pg/mL    Comment: (NOTE) This assay is not validated for testing neonatal or myeloproliferative syndrome specimens for Vitamin B12 levels. Performed at [facility], [address] [place], [place] [address]         Garner Nash, MD, MS

## 2022-07-29 ENCOUNTER — Ambulatory Visit
Admission: RE | Admit: 2022-07-29 | Discharge: 2022-07-29 | Disposition: A | Discharge status: Home / Self Care | Payer: Medicare HMO | Source: Ambulatory Visit | Attending: Physical Medicine & Rehabilitation | Admitting: Physical Medicine & Rehabilitation

## 2022-07-29 DIAGNOSIS — M5412 Radiculopathy, cervical region: Secondary | ICD-10-CM

## 2022-07-29 DIAGNOSIS — M5416 Radiculopathy, lumbar region: Secondary | ICD-10-CM

## 2022-08-14 ENCOUNTER — Ambulatory Visit: Payer: Medicare HMO | Admitting: Family Medicine

## 2022-08-16 ENCOUNTER — Encounter: Payer: Self-pay | Admitting: Adult Health

## 2022-08-16 ENCOUNTER — Ambulatory Visit (INDEPENDENT_AMBULATORY_CARE_PROVIDER_SITE_OTHER): Payer: Medicare HMO | Admitting: Adult Health

## 2022-08-16 VITALS — BP 118/60 | HR 84 | Temp 97.8°F | Ht 68.0 in | Wt 189.0 lb

## 2022-08-16 DIAGNOSIS — J9611 Chronic respiratory failure with hypoxia: Secondary | ICD-10-CM

## 2022-08-16 DIAGNOSIS — J441 Chronic obstructive pulmonary disease with (acute) exacerbation: Secondary | ICD-10-CM | POA: Diagnosis not present

## 2022-08-16 MED ORDER — PREDNISONE 20 MG PO TABS
20.0000 mg | ORAL_TABLET | Freq: Every day | ORAL | 0 refills | Status: DC
Start: 2022-08-16 — End: 2022-11-22

## 2022-08-16 MED ORDER — FLUTICASONE-SALMETEROL 250-50 MCG/ACT IN AEPB: 1.0000 | INHALATION_SPRAY | Freq: Two times a day (BID) | RESPIRATORY_TRACT | 5 refills | Status: DC

## 2022-08-16 NOTE — Progress Notes (Unsigned)
@Patient  ID: Scott [name], male    DOB: 06-02-1960, 62 y.o.   MRN: [id number]  Chief Complaint  Patient presents with   Follow-up    Referring provider: Woodfin Ganja, MD  HPI: 62 year old male former smoker quit 2024 followed for severe COPD and chronic respiratory failure Medical history significant for chronic systolic heart failure, nonischemic cardiomyopathy, diabetes Participates in the lung cancer CT screening program  TEST/EVENTS :  2D echo December 2020 EF 30 to 35%, severely decreased function, significant left ventricular septal wall thickening, grade 1 diastolic dysfunction   June 29, 2019 CT head no acute intracranial pathology   June 29, 2019 CTA PE protocol negative for PE.,  Positive for emphysema.  Newly scattered irregular nodular opacities in the lungs largest measuring 1.2 cm likely infectious/inflammatory.   PFTs October 2018 showed FEV1 at 45%, ratio 45, FVC 78%, positive bronchodilator response, severe mid flow airflow obstruction with reversibility, DLCO 61%  CT scan of the chest 04/10/2022 reviewed by me, shows moderate centrilobular and paraseptal emphysema, scattered pulmonary nodules that are unchanged, largest 3.4 mm. Lung RADS 2 study.   08/16/2022 Follow up: COPD, O2 RF  Patient presents for a 70-month follow-up.  Patient has underlying severe COPD.  Patient has quit smoking since last visit.  Congratulated him on smoking cessation.  Last visit patient did not feel like Advair made much difference in his breathing.  However since stopping Advair he says that his breathing is not as good.  He wants to restart Advair and needs a new prescription.  Says over the last few weeks has had some increased cough and intermittent wheezing.  Does have occasional nasal congestion drainage.  Denies any fever or discolored mucus.  He is currently using Combivent 4 times daily.  Remains on oxygen at 3 L with activity. Appetite is good with no nausea vomiting or diarrhea.   Weight is stable.  Denies any increased leg swelling  Allergies  Allergen Reactions   Penicillins Anaphylaxis    Has patient had a PCN reaction causing immediate rash, facial/tongue/throat swelling, SOB or lightheadedness with hypotension: Yes  Has patient had a PCN reaction causing severe rash involving mucus membranes or skin necrosis: Yes  Has patient had a PCN reaction that required hospitalization: Yes  Has patient had a PCN reaction occurring within the last 10 years: No  If all of the above answers are "NO", then may proceed with Cephalosporin use.  Has patient had a PCN reaction causing immediate rash, facial/tongue/throat swelling, SOB or lightheadedness with hypotension: Yes Has patient had a PCN reaction causing severe rash involving mucus membranes or skin necrosis: Yes Has patient had a PCN reaction that required hospitalization: Yes Has patient had a PCN reaction occurring within the last 10 years: No If all of the above answers are "NO", then may proceed with Cephalosporin use.   Cymbalta [Duloxetine Hcl]     "crazy thoughts"    Immunization History  Administered Date(s) Administered   Influenza Split 11/24/2010, 12/11/2011   Influenza Whole 12/22/2019   Influenza,inj,Quad PF,6+ Mos 12/11/2014, 10/05/2017   Influenza-Unspecified 11/06/2018   PFIZER(Purple Top)SARS-COV-2 Vaccination 04/25/2019, 05/19/2019, 02/11/2020   Pneumococcal Polysaccharide-23 09/25/2017    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   COPD (chronic obstructive pulmonary disease) (HCC)    Diabetes mellitus without complication (HCC)    GERD (gastroesophageal reflux disease)    Headache(784.0)    Heart  attack (HCC)    Pneumonia    hx    Tobacco History: Social History   Tobacco Use  Smoking Status Former   Current packs/day: 0.00   Average packs/day: 0.2 packs/day for 47.0 years (7.0 ttl pk-yrs)   Types: Cigarettes   Start  date: 01/23/1973   Quit date: 12/03/2019   Years since quitting: 2.7  Smokeless Tobacco Never  Tobacco Comments   2 cigarettes smoked daily ARJ 05/17/22   Counseling given: Not Answered Tobacco comments: 2 cigarettes smoked daily ARJ 05/17/22   Outpatient Medications Prior to Visit  Medication Sig Dispense Refill   aspirin EC 81 MG tablet Take 81 mg by mouth daily.     baclofen (LIORESAL) 10 MG tablet Take 10 mg by mouth 2 (two) times daily.     bisoprolol (ZEBETA) 5 MG tablet Take 1 tablet (5 mg total) by mouth daily. 90 tablet 3   fluticasone (FLONASE) 50 MCG/ACT nasal spray USE 2 SPRAYS NASALLY EVERY DAY 48 g 3   fluticasone-salmeterol (ADVAIR) 250-50 MCG/ACT AEPB Inhale 1 puff into the lungs in the morning and at bedtime.     furosemide (LASIX) 20 MG tablet Take 1 tablet (20 mg total) by mouth every other day. 45 tablet 3   gabapentin (NEURONTIN) 300 MG capsule TAKE 1 CAPSULE BY MOUTH 3 TIMES DAILY 90 capsule 0   Ipratropium-Albuterol (COMBIVENT RESPIMAT) 20-100 MCG/ACT AERS respimat INHALE 1 PUFF BY MOUTH FOUR TIMES DAILY 12 g 3   ipratropium-albuterol (DUONEB) 0.5-2.5 (3) MG/3ML SOLN Inhale 1 vial via nebulizer every 6 hours as needed 1080 mL 3   JANUMET 50-500 MG tablet TAKE 1 TABLET BY MOUTH ONCE A DAY 90 tablet 0   NARCAN 4 MG/0.1ML LIQD nasal spray kit Place 1 spray into the nose daily as needed (For overdose).      omeprazole (PRILOSEC) 40 MG capsule TAKE 1 CAPSULE BY MOUTH EVERY DAY 30 capsule 0   rosuvastatin (CRESTOR) 10 MG tablet Take 1 tablet (10 mg total) by mouth daily. 90 tablet 3   sacubitril-valsartan (ENTRESTO) 97-103 MG Take 1 tablet by mouth 2 (two) times daily. 60 tablet 11   VENTOLIN HFA 108 (90 Base) MCG/ACT inhaler INHALE 1 TO 2 PUFFS BY MOUTH EVERY 6 HOURS AS NEEDED FOR WHEEZING OR SHORTNESS OF BREATH 18 g 1   Vitamin D, Ergocalciferol, (DRISDOL) 1.25 MG (50000 UT) CAPS capsule TAKE ONE CAPSULE BY MOUTH ONCE WEEKLY 12 capsule 0   No facility-administered  medications prior to visit.     Review of Systems:   Constitutional:   No  weight loss, night sweats,  Fevers, chills,  +fatigue, or  lassitude.  HEENT:   No headaches,  Difficulty swallowing,  Tooth/dental problems, or  Sore throat,                No sneezing, itching, ear ache, nasal congestion, post nasal drip,   CV:  No chest pain,  Orthopnea, PND, swelling in lower extremities, anasarca, dizziness, palpitations, syncope.   GI  No heartburn, indigestion, abdominal pain, nausea, vomiting, diarrhea, change in bowel habits, loss of appetite, bloody stools.   Resp:   No chest wall deformity  Skin: no rash or lesions.  GU: no dysuria, change in color of urine, no urgency or frequency.  No flank pain, no hematuria   MS:  No joint pain or swelling.  No decreased range of motion.  No back pain.    Physical Exam  BP 118/60 (BP Location: Left Arm,  Cuff Size: Normal)   Pulse 84   Temp 97.8 F (36.6 C) (Temporal)   Ht 5\' 8"  (1.727 m)   Wt 189 lb (85.7 kg)   SpO2 92%   BMI 28.74 kg/m   GEN: A/Ox3; pleasant , NAD, well nourished    HEENT:  [place]/AT,   NOSE-clear, THROAT-clear, no lesions, no postnasal drip or exudate noted.   NECK:  Supple w/ fair ROM; no JVD; normal carotid impulses w/o bruits; no thyromegaly or nodules palpated; no lymphadenopathy.    RESP few trace rhonchi, no accessory muscle use, no dullness to percussion  CARD:  RRR, no m/r/g, tr  peripheral edema, pulses intact, no cyanosis or clubbing.  GI:   Soft & nt; nml bowel sounds; no organomegaly or masses detected.   Musco: Warm bil, no deformities or joint swelling noted.   Neuro: alert, no focal deficits noted.    Skin: Warm, no lesions or rashes    Lab Results:  CBC    Component Value Date/Time   WBC 7.2 05/07/2022 0051   RBC 4.73 05/07/2022 0051   HGB 13.4 05/07/2022 0051   HGB 17.9 (H) 09/19/2017 1133   HCT 42.3 05/07/2022 0051   HCT 53.2 (H) 09/19/2017 1133   PLT 184 05/07/2022 0051   PLT  288 09/19/2017 1133   MCV 89.4 05/07/2022 0051   MCV 87 09/19/2017 1133   MCH 28.3 05/07/2022 0051   MCHC 31.7 05/07/2022 0051   RDW 12.9 05/07/2022 0051   RDW 14.2 09/19/2017 1133   LYMPHSABS 2.1 05/07/2022 0051   MONOABS 0.6 05/07/2022 0051   EOSABS 0.1 05/07/2022 0051   BASOSABS 0.0 05/07/2022 0051    BMET    Component Value Date/Time   NA 141 05/07/2022 0051   NA 142 03/27/2022 1025   K 4.0 05/07/2022 0051   CL 106 05/07/2022 0051   CO2 29 05/07/2022 0051   GLUCOSE 102 (H) 05/07/2022 0051   BUN 14 05/07/2022 0051   BUN 9 03/27/2022 1025   CREATININE 0.85 05/07/2022 0051   CALCIUM 8.9 05/07/2022 0051   GFRNONAA >60 05/07/2022 0051   GFRAA >60 06/30/2019 0605    BNP    Component Value Date/Time   BNP 16.6 06/06/2021 1031    ProBNP    Component Value Date/Time   PROBNP 45.2 01/19/2013 1654    Imaging:   Administration History     None          Latest Ref Rng & Units 11/17/2016    9:56 AM  PFT Results  FVC-Pre L 2.87   FVC-Predicted Pre % 74   FVC-Post L 3.06   FVC-Predicted Post % 78   Pre FEV1/FVC % % 43   Post FEV1/FCV % % 45   FEV1-Pre L 1.24   FEV1-Predicted Pre % 40   FEV1-Post L 1.39   DLCO uncorrected ml/min/mmHg 18.70   DLCO UNC% % 61   DLCO corrected ml/min/mmHg 17.90   DLCO COR %Predicted % 59   DLVA Predicted % 69     No results found for: "NITRICOXIDE"      Assessment & Plan:   No problem-specific Assessment & Plan notes found for this encounter.     Rubye Oaks, NP 08/16/2022

## 2022-08-16 NOTE — Patient Instructions (Addendum)
Restart Advair 250 1 puff Twice daily, rinse after use.  Continue on Combivent 1 puff 4 times daily Continue on Oxygen 3l/m with activity.  Albuterol inhaler As needed   Prednisone 20mg  daily for 5 days.  Great job not smoking  Activity as tolerated.  Continue with yearly CT chest screening  Saline nasal spray As needed   Saline nasal gel At bedtime   Follow up with Dr. Delton Coombes  in 3 months and As needed  Please contact office for sooner follow up if symptoms do not improve or worsen or seek emergency care

## 2022-08-17 NOTE — Assessment & Plan Note (Signed)
Continue on oxygen 3 L with activity to maintain O2 saturations greater than 88 to 90%

## 2022-08-17 NOTE — Assessment & Plan Note (Signed)
Severe COPD with mild flare off of Advair.  Short prednisone burst.  Restart Advair.  Plan  Patient Instructions  Restart Advair 250 1 puff Twice daily, rinse after use.  Continue on Combivent 1 puff 4 times daily Continue on Oxygen 3l/m with activity.  Albuterol inhaler As needed   Prednisone 20mg  daily for 5 days.  Great job not smoking  Activity as tolerated.  Continue with yearly CT chest screening  Saline nasal spray As needed   Saline nasal gel At bedtime   Follow up with Scott [name]  in 3 months and As needed  Please contact office for sooner follow up if symptoms do not improve or worsen or seek emergency care

## 2022-08-21 ENCOUNTER — Encounter: Payer: Self-pay | Admitting: Family Medicine

## 2022-08-21 ENCOUNTER — Other Ambulatory Visit (HOSPITAL_COMMUNITY): Payer: Self-pay

## 2022-08-21 ENCOUNTER — Ambulatory Visit (INDEPENDENT_AMBULATORY_CARE_PROVIDER_SITE_OTHER): Payer: Medicare HMO | Admitting: Family Medicine

## 2022-08-21 VITALS — BP 136/82 | HR 74 | Temp 97.7°F | Wt 189.2 lb

## 2022-08-21 DIAGNOSIS — Z09 Encounter for follow-up examination after completed treatment for conditions other than malignant neoplasm: Secondary | ICD-10-CM

## 2022-08-21 NOTE — Progress Notes (Signed)
Assessment/Plan:   Scott [name] visited to transfer his primary care to this clinic, with no specific medical complaint. He was recently seen by Dr. [place] Lions, who conducted a comprehensive review of his chronic conditions, including diabetes, hypertension, chronic pain, and colon cancer treatment. A chart review confirmed Scott [name] received thorough primary care services from Dr. Herman Lions. Dr. Janee Morn explained that the clinic policy prohibits concurrent primary care across multiple providers to ensure a cohesive management strategy. Consequently, Scott [name] was advised that the clinic could no longer provide care following his recent extensive visit with Dr. [place] Lions. He was informed today's encounter would not be charged and he would receive a mailed summary of the clinic policies. Scott [name] was encouraged to continue his care with Dr. [place] Lions, given his familiarity with Mr. Retterer health history. Additionally, for pain management referrals, Scott [name] was advised to consult Dr. [place] Lions. Dr. Janee Morn acknowledged the potential frustration and reassured Scott [name] of the benefits of unified care. Mr. Buzek expressed frustration but understood the importance of continuing care with one primary provider and agreed to follow up with Dr. [place] Lions. In conclusion, Scott [name] was informed of the clinic's policy and was advised to maintain care with Dr. [place] Lions. No charges were incurred for today's visit, and Scott [name] was thanked for his understanding and wished well for his continued care.   There are no discontinued medications.  No follow-ups on file.    Subjective:   Encounter date: 08/21/2022  Scott [name] is a 62 y.o. male who has Cervical spondylosis with myelopathy; COPD (chronic obstructive pulmonary disease) (HCC); Hyperlipidemia; Allergic rhinitis; Degeneration of lumbar or lumbosacral intervertebral disc; Cervical post-laminectomy syndrome; Nocturnal hypoxia; Diabetes mellitus without complication (HCC); GERD  (gastroesophageal reflux disease); Acute bronchitis; Sebaceous cyst; Tobacco use disorder; Chronic pain syndrome; Tachycardia; Acute respiratory failure with hypoxia (HCC); Nausea; Acute on chronic systolic CHF (congestive heart failure), NYHA class 3 (HCC); Non-ischemic cardiomyopathy (HCC); Hypertension; Chronic kidney disease; Congestive heart failure (CHF) (HCC); Chronic respiratory failure with hypoxia (HCC); Encounter for screening for lung cancer; Severe opioid use disorder (HCC); Paranoid delusion (HCC); and Schizophreniform disorder (HCC) on their problem list..   He  has a past medical history of Arthritis, COPD (chronic obstructive pulmonary disease) (HCC), Diabetes mellitus without complication (HCC), GERD (gastroesophageal reflux disease), Headache(784.0), Heart attack (HCC), and Pneumonia..     ROS  Past Surgical History:  Procedure Laterality Date   ANTERIOR CERVICAL DECOMP/DISCECTOMY FUSION  12/22/2010   Procedure: ANTERIOR CERVICAL DECOMPRESSION/DISCECTOMY FUSION 3 LEVELS;  Surgeon: Cristi Loron;  Location: [facility];  Service: Neurosurgery;  Laterality: N/A;  Anterior cervical discectomy with fusion cervical three-four, four-five, and five sixCDF with Interbody Prosthesis, plating, and Bone Graft    ANTERIOR CERVICAL DECOMP/DISCECTOMY FUSION N/A 10/16/2012   Procedure: CERVICAL SIX-SEVEN ANTERIOR CERVICAL DECOMPRESSION/DISCECTOMY FUSION WITH INTERBODY PROTHESIS PLATING BONEGRAFT WITH /POSSIBLE HARDWARE REMOVAL OLD PLATE;  Surgeon: Cristi Loron, MD;  Location: [facility];  Service: Neurosurgery;  Laterality: N/A;   MULTIPLE TOOTH EXTRACTIONS     OTHER SURGICAL HISTORY     surgery on cheekbone and head for fall 2000   OTHER SURGICAL HISTORY     states when about 62yrs old he was urinating blood, 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     baclofen (LIORESAL) 10 MG tablet Take 10 mg by mouth 2 (two) times daily.     bisoprolol (ZEBETA) 5 MG tablet Take 1 tablet (5 mg total) by mouth daily. 90 tablet 3   fluticasone (FLONASE) 50 MCG/ACT nasal spray USE 2 SPRAYS NASALLY EVERY DAY 48 g 3   fluticasone-salmeterol (ADVAIR) 250-50 MCG/ACT AEPB Inhale 1 puff into the lungs in the morning and at bedtime. 1 each 5   furosemide (LASIX) 20 MG tablet Take 1 tablet (20 mg total) by mouth every other day. 45 tablet 3   gabapentin (NEURONTIN) 300 MG capsule TAKE 1 CAPSULE BY MOUTH 3 TIMES DAILY 90 capsule 0   Ipratropium-Albuterol (COMBIVENT RESPIMAT) 20-100 MCG/ACT AERS respimat INHALE 1 PUFF BY MOUTH FOUR TIMES DAILY 12 g 3   ipratropium-albuterol (DUONEB) 0.5-2.5 (3) MG/3ML SOLN Inhale 1 vial via nebulizer every 6 hours as needed 1080 mL 3   JANUMET 50-500 MG tablet TAKE 1 TABLET BY MOUTH ONCE A DAY 90 tablet 0   NARCAN 4 MG/0.1ML LIQD nasal spray kit Place 1 spray into the nose daily as needed (For overdose).      omeprazole (PRILOSEC) 40 MG capsule TAKE 1 CAPSULE BY MOUTH EVERY DAY 30 capsule 0   rosuvastatin (CRESTOR) 10 MG tablet Take 1 tablet (10 mg total) by mouth daily. 90 tablet 3   sacubitril-valsartan (ENTRESTO) 97-103 MG Take 1 tablet by mouth 2 (two) times daily. 60 tablet 11   VENTOLIN HFA 108 (90 Base) MCG/ACT inhaler INHALE 1 TO 2 PUFFS BY MOUTH EVERY 6 HOURS AS NEEDED FOR WHEEZING OR SHORTNESS OF BREATH 18 g 1   Vitamin D, Ergocalciferol, (DRISDOL) 1.25 MG (50000 UT) CAPS capsule TAKE ONE CAPSULE BY MOUTH ONCE WEEKLY 12 capsule 0   predniSONE (DELTASONE) 20 MG tablet Take 1 tablet (20  mg total) by mouth daily with breakfast. (Patient not taking: Reported on 08/21/2022) 5 tablet 0   No facility-administered medications prior to visit.    Family History  Problem Relation Age of Onset   Emphysema Mother    COPD Mother    Asthma Brother    Diabetes Brother    Hyperlipidemia Brother    Hypertension Brother    Asthma Sister    Hyperlipidemia Sister    Hypertension Sister    Mental illness Sister    Heart disease Maternal Uncle    Hypertension Brother     Social History   Socioeconomic History   Marital status: Married    Spouse name: Not on file   Number of children: 4   Years of education: Not on file   Highest education level: Not on file  Occupational History   Occupation: DISABLED  Tobacco Use   Smoking status: Former    Current packs/day: 0.00    Average packs/day: 0.2 packs/day for 47.0 years (7.0 ttl pk-yrs)    Types: Cigarettes    Start date: 01/23/1973    Quit date: 12/03/2019    Years since quitting: 2.7   Smokeless tobacco: Never   Tobacco comments:    2 cigarettes smoked daily [facility] 05/17/22  Vaping Use   Vaping status: Never Used  Substance and  Sexual Activity   Alcohol use: Not Currently    Comment: Beer occasionally   Drug use: No   Sexual activity: Not Currently  Other Topics Concern   Not on file  Social History Narrative   Not on file   Social Determinants of Health   Financial Resource Strain: Not on File (04/04/2022)   Received from [employer], [employer]    Financial Resource Strain: 0  Food Insecurity: Not on File (04/04/2022)   Received from [place], [place]   Food Insecurity    Food: 0  Transportation Needs: Not on File (04/04/2022)   Received from [employer] [facility], [employer] Needs    Transportation: 0  Physical Activity: Not on File (04/04/2022)   Received from [place], [place]   Physical Activity    Physical Activity: 0  Stress: Not on File (04/04/2022)   Received from [place], [place]   Stress    Stress:  0  Social Connections: Not on File (04/04/2022)   Received from [place], [place]   Social Connections    Social Connections and Isolation: 0  Intimate Partner Violence: Not on file                                                                                                  Objective:  Physical Exam: BP 136/82 (BP Location: Left Arm, Patient Position: Sitting, Cuff Size: Large)   Pulse 74   Temp 97.7 F (36.5 C) (Temporal)   Wt 189 lb 3.2 oz (85.8 kg)   SpO2 92%   BMI 28.77 kg/m     Physical Exam  MR LUMBAR SPINE WO CONTRAST  Result Date: 08/06/2022 CLINICAL DATA:  Bilateral leg pain, bilateral leg numbness and weakness EXAM: MRI LUMBAR SPINE WITHOUT CONTRAST TECHNIQUE: Multiplanar, multisequence MR imaging of the lumbar spine was performed. No intravenous contrast was administered. COMPARISON:  12/13/2017 FINDINGS: Segmentation:  Standard. Alignment:  2 mm retrolisthesis of L3 on L4. Vertebrae: No acute fracture, evidence of discitis, or aggressive bone lesion. Conus medullaris and cauda equina: Conus extends to the T12-L1 level. Conus and cauda equina appear normal. Paraspinal and other soft tissues: No acute paraspinal abnormality. Disc levels: Disc spaces: Degenerative disease with disc height loss at L1-2, L2-3, L3-4 and L4-5. Minimal disc height loss at L5-S1. T12-L1: Small shallow central disc protrusion. L1-L2: Mild broad-based disc bulge. Mild bilateral facet arthropathy. No left foraminal stenosis. No right foraminal stenosis. No spinal stenosis. L2-L3: Broad-based disc bulge. Mild bilateral facet arthropathy. Bilateral lateral recess stenosis. Mild spinal stenosis. No foraminal stenosis. L3-L4: Broad-based disc bulge. Moderate bilateral facet arthropathy with ligamentum flavum infolding. Moderate-severe spinal stenosis. Bilateral subarticular recess stenosis. Moderate bilateral foraminal stenosis. L4-L5: Broad-based disc bulge. Moderate bilateral facet arthropathy with ligamentum  flavum infolding. Moderate spinal stenosis. Bilateral subarticular recess stenosis. Moderate bilateral foraminal stenosis. L5-S1: Mild broad-based disc bulge with a small central disc protrusion. Mild bilateral facet arthropathy, right worse than left. No foraminal or central canal stenosis. IMPRESSION: 1. At L3-4 there is a broad-based disc bulge. Moderate bilateral facet arthropathy with ligamentum flavum infolding. Moderate-severe spinal stenosis.  Bilateral subarticular recess stenosis. Moderate bilateral foraminal stenosis. 2. At L4-5 there is a broad-based disc bulge. Moderate bilateral facet arthropathy with ligamentum flavum infolding. Moderate spinal stenosis. Bilateral subarticular recess stenosis. Moderate bilateral foraminal stenosis. 3. At L2-3 there is a broad-based disc bulge. Mild bilateral facet arthropathy. Bilateral lateral recess stenosis. Mild spinal stenosis. 4. No acute osseous injury of the lumbar spine. Electronically Signed   By: Elige Ko M.D.   On: 08/06/2022 12:49   MR CERVICAL SPINE WO CONTRAST  Result Date: 08/06/2022 CLINICAL DATA:  Bilateral arm pain and numbness. EXAM: MRI CERVICAL SPINE WITHOUT CONTRAST TECHNIQUE: Multiplanar, multisequence MR imaging of the cervical spine was performed. No intravenous contrast was administered. COMPARISON:  CT cervical spine 06/26/2013, MR cervical spine 11/01/2010 FINDINGS: Alignment: 2 mm anterolisthesis of C7 on T1. Vertebrae: No acute fracture, evidence of discitis, or aggressive bone lesion. Cord: Mild T2 hyperintensity in the cervical spinal cord with mild volume loss at the level of C3-4 consistent with myelomalacia. Posterior Fossa, vertebral arteries, paraspinal tissues: Posterior fossa demonstrates no focal abnormality. Vertebral artery flow voids are maintained. Paraspinal soft tissues are unremarkable. Disc levels: Discs: Anterior cervical fusion from C3 through C7 with osseous fusion across the disc spaces. Mild degenerative  disease with disc height loss at C2-3 and C7-T1. C2-3: Broad-based disc bulge and left foraminal disc osteophyte complex. Moderate left facet arthropathy. Moderate left foraminal stenosis. No right foraminal stenosis. Mild spinal stenosis. C3-4: Interbody fusion with small focal osseous ridging along the central aspect. Mild left foraminal stenosis. No right foraminal stenosis. No spinal stenosis. C4-5: Interbody fusion. No significant foraminal or central canal stenosis. C5-6: Interbody fusion. No significant left foraminal stenosis. Moderate right foraminal stenosis. No left foraminal stenosis. C6-7: Interbody fusion. Severe right and mild left foraminal stenosis. No spinal stenosis. C7-T1: Mild broad-based disc bulge. Bilateral uncovertebral degenerative changes. Severe bilateral foraminal stenosis. Mild bilateral facet arthropathy. T1-2: Bilateral uncovertebral degenerative changes. Moderate right and mild left foraminal stenosis. No spinal stenosis. IMPRESSION: 1. At C2-3 there is a broad-based disc bulge and left foraminal disc osteophyte complex. Moderate left facet arthropathy. Moderate left foraminal stenosis. 2. Anterior cervical fusion from C3 through C7 with osseous fusion across the disc spaces. At C6-7 there is severe right and mild left foraminal stenosis. At C5-6 there is moderate right foraminal stenosis. Mild myelomalacia of the cervical spinal cord at the level of C3-4. 3. At C7-T1 there is a mild broad-based disc bulge. Bilateral uncovertebral degenerative changes. Severe bilateral foraminal stenosis. Mild bilateral facet arthropathy. Electronically Signed   By: Elige Ko M.D.   On: 08/06/2022 12:45    Recent Results (from the past 2160 hour(s))  Vitamin B12     Status: None   Collection Time: 05/25/22 10:47 AM  Result Value Ref Range   Vitamin B-12 276 180 - 914 pg/mL    Comment: (NOTE) This assay is not validated for testing neonatal or myeloproliferative syndrome specimens for  Vitamin B12 levels. Performed at [facility], [address], [place], [place] [address]         Garner Nash, MD, MS

## 2022-08-22 ENCOUNTER — Telehealth: Payer: Self-pay

## 2022-08-22 ENCOUNTER — Other Ambulatory Visit (HOSPITAL_COMMUNITY): Payer: Self-pay

## 2022-08-22 NOTE — Telephone Encounter (Signed)
PA request received from pharmacy for Brand Advair, generic is covered and preferred. Pharmacy states patient only wants Brand but there is no notation of adverse effects from generic version of the medication. Please advise.

## 2022-08-25 NOTE — Telephone Encounter (Signed)
Called and spoke with patient.  Advised patient os Dr. Kavin Leech recommendations.  Patient refused stating he can only use the brand Advair, because generic has been tried in the past and was not the same as the brand Advair. Patient stated brand Advair controls his COPD and he is afraid anything else may cause flare ups.  Message routed to Pharmacy Team

## 2022-08-25 NOTE — Telephone Encounter (Signed)
I would recommend that the patient try the generic, see how he does with it. If he can't tolerate then the PA will probably go through

## 2022-08-28 NOTE — Telephone Encounter (Signed)
PA has been DENIED:   The drug you asked for is not listed in your preferred drug list (formulary). The preferred drug(s), you may not have tried, are: Advair HFA aerosol inhaler fluticasone prop-salmeterol breath activated powder inhaler (generic for AirDuo Respiclick) Symbicort HFA aerosol inhaler Your provider needs to give [place] medical reasons why the preferred drug(s) would not work for you and/or would have bad side effects. Sometimes a preferred drug needs more review for approval. Additionally, some preferred drugs listed may be the same drugs with different strengths or forms. [employer] may only require one strength or form of that drug to be tried.

## 2022-08-28 NOTE — Telephone Encounter (Signed)
Pharmacy Patient Advocate Encounter   Received notification from CoverMyMeds that prior authorization for Advair Diskus 250-50MCG/ACT aerosol powder  is required/requested.   Insurance verification completed.   The patient is insured through [employer] .   Per test claim: PA required; PA submitted to [facility] via CoverMyMeds Key/confirmation #/EOC [id number] Status is pending

## 2022-08-30 ENCOUNTER — Telehealth: Payer: Self-pay | Admitting: Adult Health

## 2022-08-30 ENCOUNTER — Other Ambulatory Visit: Payer: Self-pay

## 2022-08-30 MED ORDER — COMBIVENT RESPIMAT 20-100 MCG/ACT IN AERS: INHALATION_SPRAY | RESPIRATORY_TRACT | 3 refills | Status: DC

## 2022-08-30 MED ORDER — FLUTICASONE-SALMETEROL 250-50 MCG/ACT IN AEPB: 1.0000 | INHALATION_SPRAY | Freq: Two times a day (BID) | RESPIRATORY_TRACT | 11 refills | Status: DC

## 2022-08-30 NOTE — Telephone Encounter (Signed)
Spoke with patient. Advised inhalers have been sent to pharmacy. Closing encounter. NFN

## 2022-09-15 ENCOUNTER — Telehealth: Payer: Self-pay

## 2022-09-15 ENCOUNTER — Telehealth: Payer: Self-pay | Admitting: Cardiology

## 2022-09-15 DIAGNOSIS — I428 Other cardiomyopathies: Secondary | ICD-10-CM

## 2022-09-15 MED ORDER — BISOPROLOL FUMARATE 5 MG PO TABS: 5.0000 mg | ORAL_TABLET | Freq: Every day | ORAL | 3 refills | Status: DC

## 2022-09-15 MED ORDER — SACUBITRIL-VALSARTAN 97-103 MG PO TABS
1.0000 | ORAL_TABLET | Freq: Two times a day (BID) | ORAL | 6 refills | Status: DC
Start: 2022-09-15 — End: 2023-02-06

## 2022-09-15 MED ORDER — FUROSEMIDE 20 MG PO TABS: 20.0000 mg | ORAL_TABLET | ORAL | 3 refills | Status: DC

## 2022-09-15 NOTE — Telephone Encounter (Signed)
Patient came in to verify that we had their new pharmacy as [facility]. Stated he had received the wrong dosage for [facility], so he wanted to speak to someone. Let him know we would be reaching out.

## 2022-09-15 NOTE — Telephone Encounter (Signed)
Patient walks in for medications.  States his Entresto dose is incorrect.  Patient had not picked up the increased dose at the pharmacy.  He is aware of the dose but issue with pharmacies.  Entresto, Lasix,  and Bisoprolol ordered to new pharmacy [facility].  Verified location, medications and dose with patient

## 2022-11-19 ENCOUNTER — Other Ambulatory Visit: Payer: Self-pay

## 2022-11-19 ENCOUNTER — Encounter (HOSPITAL_COMMUNITY): Payer: Self-pay

## 2022-11-19 ENCOUNTER — Emergency Department (HOSPITAL_COMMUNITY): Payer: Medicare HMO

## 2022-11-19 ENCOUNTER — Inpatient Hospital Stay (HOSPITAL_COMMUNITY)
Admission: EM | Admit: 2022-11-19 | Discharge: 2022-11-22 | DRG: 871 | Disposition: A | Discharge status: Home / Self Care | Payer: Medicare HMO | Attending: Internal Medicine | Admitting: Internal Medicine

## 2022-11-19 DIAGNOSIS — Z79891 Long term (current) use of opiate analgesic: Secondary | ICD-10-CM

## 2022-11-19 DIAGNOSIS — R0602 Shortness of breath: Secondary | ICD-10-CM | POA: Diagnosis not present

## 2022-11-19 DIAGNOSIS — A419 Sepsis, unspecified organism: Secondary | ICD-10-CM | POA: Diagnosis not present

## 2022-11-19 DIAGNOSIS — Z981 Arthrodesis status: Secondary | ICD-10-CM

## 2022-11-19 DIAGNOSIS — Z825 Family history of asthma and other chronic lower respiratory diseases: Secondary | ICD-10-CM

## 2022-11-19 DIAGNOSIS — K219 Gastro-esophageal reflux disease without esophagitis: Secondary | ICD-10-CM | POA: Diagnosis present

## 2022-11-19 DIAGNOSIS — G894 Chronic pain syndrome: Secondary | ICD-10-CM | POA: Diagnosis present

## 2022-11-19 DIAGNOSIS — J9621 Acute and chronic respiratory failure with hypoxia: Secondary | ICD-10-CM | POA: Diagnosis present

## 2022-11-19 DIAGNOSIS — Z7984 Long term (current) use of oral hypoglycemic drugs: Secondary | ICD-10-CM

## 2022-11-19 DIAGNOSIS — Z87891 Personal history of nicotine dependence: Secondary | ICD-10-CM

## 2022-11-19 DIAGNOSIS — J439 Emphysema, unspecified: Secondary | ICD-10-CM | POA: Diagnosis present

## 2022-11-19 DIAGNOSIS — R000 Tachycardia, unspecified: Secondary | ICD-10-CM | POA: Diagnosis present

## 2022-11-19 DIAGNOSIS — Z880 Allergy status to penicillin: Secondary | ICD-10-CM

## 2022-11-19 DIAGNOSIS — J441 Chronic obstructive pulmonary disease with (acute) exacerbation: Secondary | ICD-10-CM | POA: Diagnosis present

## 2022-11-19 DIAGNOSIS — I110 Hypertensive heart disease with heart failure: Secondary | ICD-10-CM | POA: Diagnosis present

## 2022-11-19 DIAGNOSIS — J189 Pneumonia, unspecified organism: Secondary | ICD-10-CM | POA: Diagnosis present

## 2022-11-19 DIAGNOSIS — I5032 Chronic diastolic (congestive) heart failure: Secondary | ICD-10-CM | POA: Diagnosis present

## 2022-11-19 DIAGNOSIS — Z1152 Encounter for screening for COVID-19: Secondary | ICD-10-CM

## 2022-11-19 DIAGNOSIS — M479 Spondylosis, unspecified: Secondary | ICD-10-CM | POA: Diagnosis present

## 2022-11-19 DIAGNOSIS — I252 Old myocardial infarction: Secondary | ICD-10-CM

## 2022-11-19 DIAGNOSIS — Z888 Allergy status to other drugs, medicaments and biological substances status: Secondary | ICD-10-CM

## 2022-11-19 DIAGNOSIS — Z7951 Long term (current) use of inhaled steroids: Secondary | ICD-10-CM

## 2022-11-19 DIAGNOSIS — M542 Cervicalgia: Secondary | ICD-10-CM | POA: Diagnosis present

## 2022-11-19 DIAGNOSIS — Z8249 Family history of ischemic heart disease and other diseases of the circulatory system: Secondary | ICD-10-CM

## 2022-11-19 DIAGNOSIS — Z7982 Long term (current) use of aspirin: Secondary | ICD-10-CM

## 2022-11-19 DIAGNOSIS — Z79899 Other long term (current) drug therapy: Secondary | ICD-10-CM

## 2022-11-19 DIAGNOSIS — E119 Type 2 diabetes mellitus without complications: Secondary | ICD-10-CM | POA: Diagnosis present

## 2022-11-19 DIAGNOSIS — Z9981 Dependence on supplemental oxygen: Secondary | ICD-10-CM

## 2022-11-19 DIAGNOSIS — J440 Chronic obstructive pulmonary disease with acute lower respiratory infection: Secondary | ICD-10-CM | POA: Diagnosis present

## 2022-11-19 DIAGNOSIS — Z833 Family history of diabetes mellitus: Secondary | ICD-10-CM

## 2022-11-19 LAB — COMPREHENSIVE METABOLIC PANEL
ALT: 17 U/L (ref 0–44)
AST: 24 U/L (ref 15–41)
Albumin: 3.7 g/dL (ref 3.5–5.0)
Alkaline Phosphatase: 84 U/L (ref 38–126)
Anion gap: 12 (ref 5–15)
BUN: 13 mg/dL (ref 8–23)
CO2: 27 mmol/L (ref 22–32)
Calcium: 9 mg/dL (ref 8.9–10.3)
Chloride: 101 mmol/L (ref 98–111)
Creatinine, Ser: 0.98 mg/dL (ref 0.61–1.24)
GFR, Estimated: >60 mL/min (ref >60)
Glucose, Bld: 107 mg/dL — ABNORMAL HIGH (ref 70–99)
Potassium: 3.4 mmol/L — ABNORMAL LOW (ref 3.5–5.1)
Sodium: 140 mmol/L (ref 135–145)
Total Bilirubin: 1.6 mg/dL — ABNORMAL HIGH (ref 0.3–1.2)
Total Protein: 7.7 g/dL (ref 6.5–8.1)

## 2022-11-19 LAB — CBC WITH DIFFERENTIAL/PLATELET
Abs Immature Granulocytes: 0.07 10*3/uL (ref 0.00–0.07)
Basophils Absolute: 0.1 10*3/uL (ref 0.0–0.1)
Basophils Relative: 1 %
Eosinophils Absolute: 0 10*3/uL (ref 0.0–0.5)
Eosinophils Relative: 0 %
HCT: 40.5 % (ref 39.0–52.0)
Hemoglobin: 13.1 g/dL (ref 13.0–17.0)
Immature Granulocytes: 1 %
Lymphocytes Relative: 11 %
Lymphs Abs: 1.3 10*3/uL (ref 0.7–4.0)
MCH: 28.4 pg (ref 26.0–34.0)
MCHC: 32.3 g/dL (ref 30.0–36.0)
MCV: 87.9 fL (ref 80.0–100.0)
Monocytes Absolute: 1.1 10*3/uL — ABNORMAL HIGH (ref 0.1–1.0)
Monocytes Relative: 9 %
Neutro Abs: 9.5 10*3/uL — ABNORMAL HIGH (ref 1.7–7.7)
Neutrophils Relative %: 78 %
Platelets: 226 10*3/uL (ref 150–400)
RBC: 4.61 MIL/uL (ref 4.22–5.81)
RDW: 13.9 % (ref 11.5–15.5)
WBC: 12.1 10*3/uL — ABNORMAL HIGH (ref 4.0–10.5)
nRBC: 0 % (ref 0.0–0.2)

## 2022-11-19 LAB — SARS CORONAVIRUS 2 BY RT PCR: SARS Coronavirus 2 by RT PCR: NEGATIVE

## 2022-11-19 LAB — BRAIN NATRIURETIC PEPTIDE: B Natriuretic Peptide: 116.3 pg/mL — ABNORMAL HIGH (ref 0.0–100.0)

## 2022-11-19 MED ORDER — METHYLPREDNISOLONE SODIUM SUCC 125 MG IJ SOLR
125.0000 mg | Freq: Once | INTRAMUSCULAR | Status: AC
  Administered 2022-11-19: 125 mg via INTRAVENOUS
  Filled 2022-11-19: qty 2

## 2022-11-19 MED ORDER — IOHEXOL 350 MG/ML SOLN
75.0000 mL | Freq: Once | INTRAVENOUS | Status: AC | PRN
  Administered 2022-11-19: 75 mL via INTRAVENOUS

## 2022-11-19 MED ORDER — IPRATROPIUM-ALBUTEROL 0.5-2.5 (3) MG/3ML IN SOLN
3.0000 mL | RESPIRATORY_TRACT | Status: AC
  Administered 2022-11-19 (×3): 3 mL via RESPIRATORY_TRACT
  Filled 2022-11-19: qty 3
  Filled 2022-11-19: qty 6

## 2022-11-19 NOTE — ED Provider Notes (Signed)
[place] EMERGENCY DEPARTMENT AT [facility] Provider Note   CSN: [id number] Arrival date & time: 11/19/22  2030     History  Chief Complaint  Patient presents with   Shortness of Breath    Scott [name] is a 62 y.o. male with PMH as listed below who presents with SOB, fever, and headache x1 week. Wears 3L 02 at home for COPD, which has kept his O2 ~91% while he is at rest but when he walks he drops to 87%. Pt has increased WOB. States this feels different than a COPD exacerbation. Took an old Z pack last week which did not help and recently was prescribed prednisone as well which did not help. Inhalers at home help some but haven't been able to control this SOB. Endorses cough, mucous production that is worse than normal. Denies CP. Endorses abdominal soreness d/t severe coughing. Has had several episodes of PNA in the past as well.   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 Authorizing Provider  aspirin EC 81 MG tablet Take 81 mg by mouth daily.    [provider]  baclofen (LIORESAL) 10 MG tablet Take 10 mg by mouth 2 (two) times daily. 04/03/22   [provider]  bisoprolol (ZEBETA) 5 MG tablet Take 1 tablet (5 mg total) by mouth daily. 09/15/22   Rollene Rotunda, MD  fluticasone (FLONASE) 50 MCG/ACT nasal spray USE 2 SPRAYS NASALLY EVERY DAY 05/25/21   Leslye Peer, MD  fluticasone-salmeterol (ADVAIR) 250-50 MCG/ACT AEPB Inhale 1 puff into the lungs in the morning and at bedtime. 08/16/22   Parrett, Virgel Bouquet, NP  fluticasone-salmeterol (ADVAIR) 250-50 MCG/ACT AEPB Inhale 1 puff into the  lungs every 12 (twelve) hours. 08/30/22   Leslye Peer, MD  furosemide (LASIX) 20 MG tablet Take 1 tablet (20 mg total) by mouth every other day. 09/15/22   Rollene Rotunda, MD  gabapentin (NEURONTIN) 300 MG capsule TAKE 1 CAPSULE BY MOUTH 3 TIMES DAILY 11/21/18   Lezlie Lye, Meda Coffee, MD  Ipratropium-Albuterol (COMBIVENT RESPIMAT) 20-100 MCG/ACT AERS respimat INHALE 1 PUFF BY MOUTH FOUR TIMES DAILY 08/30/22   Leslye Peer, MD  ipratropium-albuterol (DUONEB) 0.5-2.5 (3) MG/3ML SOLN Inhale 1 vial via nebulizer every 6 hours as needed 07/08/21   Leslye Peer, MD  JANUMET 50-500 MG tablet TAKE 1 TABLET BY MOUTH ONCE A DAY 07/31/18   Lezlie Lye, Meda Coffee, MD  [facility] 4 MG/0.1ML LIQD nasal spray kit Place 1 spray into the nose daily as needed (For overdose).  06/13/18   [provider]  omeprazole (PRILOSEC) 40 MG capsule TAKE 1 CAPSULE BY MOUTH EVERY DAY 01/08/19   Lezlie Lye, Meda Coffee, MD  predniSONE (DELTASONE) 20 MG tablet Take 1 tablet (20 mg total) by mouth daily with breakfast. Patient not taking: Reported on 08/21/2022 08/16/22   Parrett, Virgel Bouquet, NP  rosuvastatin (CRESTOR) 10 MG tablet Take 1 tablet (10 mg total) by mouth daily. 09/13/21   Joylene Grapes, NP  sacubitril-valsartan (ENTRESTO) 97-103 MG Take 1 tablet  by mouth 2 (two) times daily. 09/15/22   Rollene Rotunda, MD  VENTOLIN HFA 108 (90 Base) MCG/ACT inhaler INHALE 1 TO 2 PUFFS BY MOUTH EVERY 6 HOURS AS NEEDED FOR WHEEZING OR SHORTNESS OF BREATH 06/06/22   Byrum, Les Pou, MD  Vitamin D, Ergocalciferol, (DRISDOL) 1.25 MG (50000 UT) CAPS capsule TAKE ONE CAPSULE BY MOUTH ONCE WEEKLY 12/18/18   Lezlie Lye, Meda Coffee, MD      Allergies    Penicillins and Cymbalta [duloxetine hcl]    Review of Systems   Review of Systems A 10 point review of systems was performed and is negative unless otherwise reported in HPI.  Physical Exam Updated Vital Signs BP (!) 152/84   Pulse (!) 122   Temp 100 F (37.8 C) (Oral)   Resp 20   SpO2  (!) 87%  Physical Exam General: Elderly appearing male in mild resp distress, sitting in bed.  HEENT: Sclera anicteric, MMM, trachea midline.  Cardiology: RRR, no murmurs/rubs/gallops. BL radial and DP pulses equal bilaterally.  Resp: Increased respiratory rate and effort, mild resp distress. 90% on 4L [place]. Coughing intermittent productive of sputum.   Abd: Soft, non-tender, non-distended. No rebound tenderness or guarding.  GU: Deferred. MSK: No peripheral edema or signs of trauma.  Skin: warm, dry.  Neuro: A&Ox4, CNs II-XII grossly intact. MAEs. Sensation grossly intact.  Psych: Normal mood and affect.   ED Results / Procedures / Treatments   Labs (all labs ordered are listed, but only abnormal results are displayed) Labs Reviewed  CBC WITH DIFFERENTIAL/PLATELET - Abnormal; Notable for the following components:      Result Value   WBC 12.1 (*)    Neutro Abs 9.5 (*)    Monocytes Absolute 1.1 (*)    All other components within normal limits  COMPREHENSIVE METABOLIC PANEL - Abnormal; Notable for the following components:   Potassium 3.4 (*)    Glucose, Bld 107 (*)    Total Bilirubin 1.6 (*)    All other components within normal limits  BRAIN NATRIURETIC PEPTIDE - Abnormal; Notable for the following components:   B Natriuretic Peptide 116.3 (*)    All other components within normal limits  SARS CORONAVIRUS 2 BY RT PCR  CULTURE, BLOOD (ROUTINE X 2)  CULTURE, BLOOD (ROUTINE X 2)    EKG EKG Interpretation Date/Time:  Sunday November 19 2022 20:54:11 EDT Ventricular Rate:  121 PR Interval:  195 QRS Duration:  97 QT Interval:  306 QTC Calculation: 435 R Axis:   80  Text Interpretation: Sinus tachycardia ST changes similar to prior Confirmed by Vivi Barrack [phone]) on 11/19/2022 9:29:15 PM  Radiology DG Chest Port 1 View  Result Date: 11/19/2022 CLINICAL DATA:  Short of breath, fever, headache for 1 week EXAM: PORTABLE CHEST 1 VIEW COMPARISON:  04/22/2022 FINDINGS: Two  frontal views of the chest demonstrate an unremarkable cardiac silhouette. Lungs are hyperinflated, without acute airspace disease, effusion, or pneumothorax. No acute bony abnormalities. IMPRESSION: 1. Stable hyperinflation consistent with emphysema. No acute airspace disease. Electronically Signed   By: Sharlet Salina M.D.   On: 11/19/2022 21:43    Procedures Procedures    Medications Ordered in ED Medications  ipratropium-albuterol (DUONEB) 0.5-2.5 (3) MG/3ML nebulizer solution 3 mL (3 mLs Nebulization Given 11/19/22 2230)  methylPREDNISolone sodium succinate (SOLU-MEDROL) 125 mg/2 mL injection 125 mg (125 mg Intravenous Given 11/19/22 2213)    ED Course/ Medical Decision Making/ A&P  Medical Decision Making Amount and/or Complexity of Data Reviewed Labs: ordered. Decision-making details documented in ED Course. Radiology: ordered. Decision-making details documented in ED Course.  Risk Prescription drug management.    This patient presents to the ED for concern of SOB, cough, fever;, this involves an extensive number of treatment options, and is a complaint that carries with it a high risk of complications and morbidity.  I considered the following differential and admission for this acute, potentially life threatening condition.   MDM:    DDX for dyspnea includes but is not limited to:  Highest degree of c/f COPD exacerbation vs PNA vs lower respiratory infection. He presents tachycardic and tachypneic w/ increased WOB, hypoxic to 70% on RA briefly while he got up and took his O2 off to get his phone from his bag, then 85-88% on 3L [place] so increased to 4L, now ~90%. He has quiet lung sounds with minimal wheezing, will give 3 duonebs and solumedrol and reevaluate. Must also consider PE though he has no signs/sxs of DVT and if no other cause for SOB/hypoxia is found will consider CT PE. Also consider HF exacerbation though does not appear volume overloaded on  exam, no crackles. EKG shows sinus tachycardia. No CP to suggest ACS.    Clinical Course as of 11/19/22 2306  Sun Nov 19, 2022  2141 WBC(!): 12.1 +leukocytosis, but was also recently on prednisone. Patient does meet sepsis criteria, will draw blood cultures. [HN]  2238 Comprehensive metabolic panel(!) Unremarkable in the context of this patient's presentation   [HN]  2238 SARS Coronavirus 2 by RT PCR: NEGATIVE [HN]  2238 B Natriuretic Peptide(!): 116.3 Very mildly elevated [HN]  2239 DG Chest Port 1 View 1. Stable hyperinflation consistent with emphysema. No acute airspace disease.   [HN]  2239 No PNA noted on CXR [HN]  2304 Pt reevaluated. Some but not significant improvement with nebulizers. If etiology is COPD exacerbation, will need admission,b ut will get CT PE to r/o PE first. Patient is signed out to the oncoming ED physician Dr. Blinda Leatherwood who is made aware of his history, presentation, exam, workup, and plan. reevaluate. [HN]    Clinical Course User Index [HN] Loetta Rough, MD    Labs: I Ordered, and personally interpreted labs.  The pertinent results include:  those listed above  Imaging Studies ordered: I ordered imaging studies including CXR I independently visualized and interpreted imaging. I agree with the radiologist interpretation  Additional history obtained from chart review.   Cardiac Monitoring: The patient was maintained on a cardiac monitor.  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  Ordered Dose: 1 to 125 mg TDD; convert to: TDD q24h.  Ordered Dose: 126 to 250 mg TDD; convert to: TDD div q12h.  Ordered Dose: >250 mg TDD; DAW.    I have reviewed the patients home medicines and have made adjustments as needed  Problem List / ED Course: Problem List Items Addressed This Visit   None Visit Diagnoses     SOB (shortness of breath)    -  Primary                   This note was created using dictation software, which may contain spelling or grammatical errors.    Loetta Rough, MD 11/19/22 [phone]

## 2022-11-19 NOTE — ED Triage Notes (Signed)
Pt presents via POV c/o SOB, fever, and headache x1 weeks. Reports completed abx as OP. Wears 3L 02 at home. Pt has increased WOB. Pausing in between words during triage.

## 2022-11-20 DIAGNOSIS — I110 Hypertensive heart disease with heart failure: Secondary | ICD-10-CM | POA: Diagnosis present

## 2022-11-20 DIAGNOSIS — K219 Gastro-esophageal reflux disease without esophagitis: Secondary | ICD-10-CM | POA: Diagnosis present

## 2022-11-20 DIAGNOSIS — Z1152 Encounter for screening for COVID-19: Secondary | ICD-10-CM | POA: Diagnosis not present

## 2022-11-20 DIAGNOSIS — Z825 Family history of asthma and other chronic lower respiratory diseases: Secondary | ICD-10-CM | POA: Diagnosis not present

## 2022-11-20 DIAGNOSIS — Z87891 Personal history of nicotine dependence: Secondary | ICD-10-CM | POA: Diagnosis not present

## 2022-11-20 DIAGNOSIS — J189 Pneumonia, unspecified organism: Secondary | ICD-10-CM | POA: Diagnosis present

## 2022-11-20 DIAGNOSIS — Z981 Arthrodesis status: Secondary | ICD-10-CM | POA: Diagnosis not present

## 2022-11-20 DIAGNOSIS — Z888 Allergy status to other drugs, medicaments and biological substances status: Secondary | ICD-10-CM | POA: Diagnosis not present

## 2022-11-20 DIAGNOSIS — J439 Emphysema, unspecified: Secondary | ICD-10-CM | POA: Diagnosis present

## 2022-11-20 DIAGNOSIS — Z8249 Family history of ischemic heart disease and other diseases of the circulatory system: Secondary | ICD-10-CM | POA: Diagnosis not present

## 2022-11-20 DIAGNOSIS — J440 Chronic obstructive pulmonary disease with acute lower respiratory infection: Secondary | ICD-10-CM | POA: Diagnosis present

## 2022-11-20 DIAGNOSIS — M479 Spondylosis, unspecified: Secondary | ICD-10-CM | POA: Diagnosis present

## 2022-11-20 DIAGNOSIS — Z9981 Dependence on supplemental oxygen: Secondary | ICD-10-CM | POA: Diagnosis not present

## 2022-11-20 DIAGNOSIS — I5032 Chronic diastolic (congestive) heart failure: Secondary | ICD-10-CM | POA: Diagnosis present

## 2022-11-20 DIAGNOSIS — J441 Chronic obstructive pulmonary disease with (acute) exacerbation: Secondary | ICD-10-CM | POA: Diagnosis present

## 2022-11-20 DIAGNOSIS — A419 Sepsis, unspecified organism: Secondary | ICD-10-CM | POA: Diagnosis present

## 2022-11-20 DIAGNOSIS — E119 Type 2 diabetes mellitus without complications: Secondary | ICD-10-CM | POA: Diagnosis present

## 2022-11-20 DIAGNOSIS — G894 Chronic pain syndrome: Secondary | ICD-10-CM | POA: Diagnosis present

## 2022-11-20 DIAGNOSIS — Z833 Family history of diabetes mellitus: Secondary | ICD-10-CM | POA: Diagnosis not present

## 2022-11-20 DIAGNOSIS — J9621 Acute and chronic respiratory failure with hypoxia: Secondary | ICD-10-CM | POA: Diagnosis present

## 2022-11-20 DIAGNOSIS — M542 Cervicalgia: Secondary | ICD-10-CM | POA: Diagnosis present

## 2022-11-20 DIAGNOSIS — R000 Tachycardia, unspecified: Secondary | ICD-10-CM | POA: Diagnosis present

## 2022-11-20 DIAGNOSIS — Z880 Allergy status to penicillin: Secondary | ICD-10-CM | POA: Diagnosis not present

## 2022-11-20 DIAGNOSIS — R0602 Shortness of breath: Secondary | ICD-10-CM | POA: Diagnosis present

## 2022-11-20 DIAGNOSIS — I252 Old myocardial infarction: Secondary | ICD-10-CM | POA: Diagnosis not present

## 2022-11-20 LAB — CBC
HCT: 40.8 % (ref 39.0–52.0)
Hemoglobin: 13.1 g/dL (ref 13.0–17.0)
MCH: 28.5 pg (ref 26.0–34.0)
MCHC: 32.1 g/dL (ref 30.0–36.0)
MCV: 88.9 fL (ref 80.0–100.0)
Platelets: 246 10*3/uL (ref 150–400)
RBC: 4.59 MIL/uL (ref 4.22–5.81)
RDW: 13.8 % (ref 11.5–15.5)
WBC: 9.5 10*3/uL (ref 4.0–10.5)
nRBC: 0 % (ref 0.0–0.2)

## 2022-11-20 LAB — RESPIRATORY PANEL BY PCR

## 2022-11-20 LAB — HEMOGLOBIN A1C
Hgb A1c MFr Bld: 6.3 % — ABNORMAL HIGH (ref 4.8–5.6)
Mean Plasma Glucose: 134.11 mg/dL

## 2022-11-20 LAB — CREATININE, SERUM
Creatinine, Ser: 0.84 mg/dL (ref 0.61–1.24)
GFR, Estimated: >60 mL/min (ref >60)

## 2022-11-20 LAB — GLUCOSE, CAPILLARY
Glucose-Capillary: 153 mg/dL — ABNORMAL HIGH (ref 70–99)
Glucose-Capillary: 251 mg/dL — ABNORMAL HIGH (ref 70–99)

## 2022-11-20 LAB — CBG MONITORING, ED: Glucose-Capillary: 203 mg/dL — ABNORMAL HIGH (ref 70–99)

## 2022-11-20 LAB — HIV ANTIBODY (ROUTINE TESTING W REFLEX): HIV Screen 4th Generation wRfx: NONREACTIVE

## 2022-11-20 MED ORDER — LEVOFLOXACIN IN D5W 750 MG/150ML IV SOLN
750.0000 mg | Freq: Once | INTRAVENOUS | Status: AC
  Administered 2022-11-20: 750 mg via INTRAVENOUS
  Filled 2022-11-20: qty 150

## 2022-11-20 MED ORDER — GABAPENTIN 300 MG PO CAPS
300.0000 mg | ORAL_CAPSULE | Freq: Three times a day (TID) | ORAL | Status: DC
  Administered 2022-11-20 – 2022-11-21 (×6): 300 mg via ORAL
  Filled 2022-11-20 (×7): qty 1

## 2022-11-20 MED ORDER — LEVOFLOXACIN IN D5W 750 MG/150ML IV SOLN
750.0000 mg | INTRAVENOUS | Status: DC
  Administered 2022-11-21: 750 mg via INTRAVENOUS
  Filled 2022-11-20: qty 150

## 2022-11-20 MED ORDER — PANTOPRAZOLE SODIUM 40 MG PO TBEC
40.0000 mg | DELAYED_RELEASE_TABLET | Freq: Every day | ORAL | Status: DC
Start: 2022-11-20 — End: 2022-11-22
  Administered 2022-11-20 – 2022-11-21 (×2): 40 mg via ORAL
  Filled 2022-11-20 (×3): qty 1

## 2022-11-20 MED ORDER — ONDANSETRON HCL 4 MG PO TABS: 4.0000 mg | ORAL_TABLET | Freq: Four times a day (QID) | ORAL | Status: DC | PRN

## 2022-11-20 MED ORDER — FUROSEMIDE 20 MG PO TABS
10.0000 mg | ORAL_TABLET | Freq: Every day | ORAL | Status: DC
  Administered 2022-11-20 – 2022-11-21 (×2): 10 mg via ORAL
  Filled 2022-11-20: qty 1
  Filled 2022-11-20: qty 0.5
  Filled 2022-11-20: qty 1

## 2022-11-20 MED ORDER — SODIUM CHLORIDE 0.9% FLUSH
3.0000 mL | Freq: Two times a day (BID) | INTRAVENOUS | Status: DC
Start: 2022-11-20 — End: 2022-11-22
  Administered 2022-11-20 – 2022-11-21 (×4): 3 mL via INTRAVENOUS

## 2022-11-20 MED ORDER — OXYCODONE-ACETAMINOPHEN 10-325 MG PO TABS: 1.0000 | ORAL_TABLET | Freq: Four times a day (QID) | ORAL | Status: DC | PRN

## 2022-11-20 MED ORDER — IPRATROPIUM-ALBUTEROL 0.5-2.5 (3) MG/3ML IN SOLN
3.0000 mL | Freq: Four times a day (QID) | RESPIRATORY_TRACT | Status: DC
  Administered 2022-11-20 – 2022-11-21 (×7): 3 mL via RESPIRATORY_TRACT
  Filled 2022-11-20 (×7): qty 3

## 2022-11-20 MED ORDER — ONDANSETRON HCL 4 MG/2ML IJ SOLN: 4.0000 mg | Freq: Four times a day (QID) | INTRAMUSCULAR | Status: DC | PRN

## 2022-11-20 MED ORDER — INSULIN ASPART 100 UNIT/ML IJ SOLN
0.0000 [IU] | Freq: Every day | INTRAMUSCULAR | Status: DC
  Administered 2022-11-20: 3 [IU] via SUBCUTANEOUS
  Filled 2022-11-20: qty 0.05

## 2022-11-20 MED ORDER — ROSUVASTATIN CALCIUM 10 MG PO TABS
10.0000 mg | ORAL_TABLET | Freq: Every day | ORAL | Status: DC
  Administered 2022-11-20 – 2022-11-21 (×2): 10 mg via ORAL
  Filled 2022-11-20 (×3): qty 1

## 2022-11-20 MED ORDER — ASPIRIN 81 MG PO TBEC
81.0000 mg | DELAYED_RELEASE_TABLET | Freq: Every day | ORAL | Status: DC
  Administered 2022-11-20 – 2022-11-21 (×2): 81 mg via ORAL
  Filled 2022-11-20 (×3): qty 1

## 2022-11-20 MED ORDER — GUAIFENESIN-DM 100-10 MG/5ML PO SYRP: 5.0000 mL | ORAL_SOLUTION | ORAL | Status: DC | PRN

## 2022-11-20 MED ORDER — INSULIN ASPART 100 UNIT/ML IJ SOLN
0.0000 [IU] | Freq: Three times a day (TID) | INTRAMUSCULAR | Status: DC
  Administered 2022-11-20: 3 [IU] via SUBCUTANEOUS
  Administered 2022-11-20: 5 [IU] via SUBCUTANEOUS
  Administered 2022-11-21 (×3): 3 [IU] via SUBCUTANEOUS
  Administered 2022-11-22: 2 [IU] via SUBCUTANEOUS
  Filled 2022-11-20: qty 0.15

## 2022-11-20 MED ORDER — BACLOFEN 10 MG PO TABS
10.0000 mg | ORAL_TABLET | Freq: Two times a day (BID) | ORAL | Status: DC
  Administered 2022-11-20 – 2022-11-21 (×3): 10 mg via ORAL
  Filled 2022-11-20 (×5): qty 1

## 2022-11-20 MED ORDER — ACETAMINOPHEN 325 MG PO TABS: 650.0000 mg | ORAL_TABLET | Freq: Four times a day (QID) | ORAL | Status: DC | PRN

## 2022-11-20 MED ORDER — BISOPROLOL FUMARATE 5 MG PO TABS
5.0000 mg | ORAL_TABLET | Freq: Every day | ORAL | Status: DC
  Administered 2022-11-20 – 2022-11-21 (×2): 5 mg via ORAL
  Filled 2022-11-20 (×3): qty 1

## 2022-11-20 MED ORDER — METHYLPREDNISOLONE SODIUM SUCC 40 MG IJ SOLR
40.0000 mg | Freq: Two times a day (BID) | INTRAMUSCULAR | Status: DC
  Administered 2022-11-20 – 2022-11-21 (×4): 40 mg via INTRAVENOUS
  Filled 2022-11-20 (×5): qty 1

## 2022-11-20 MED ORDER — ENOXAPARIN SODIUM 40 MG/0.4ML IJ SOSY
40.0000 mg | PREFILLED_SYRINGE | INTRAMUSCULAR | Status: DC
  Administered 2022-11-21: 40 mg via SUBCUTANEOUS
  Filled 2022-11-20 (×3): qty 0.4

## 2022-11-20 MED ORDER — LINAGLIPTIN 5 MG PO TABS
5.0000 mg | ORAL_TABLET | Freq: Every day | ORAL | Status: DC
  Administered 2022-11-20 – 2022-11-21 (×2): 5 mg via ORAL
  Filled 2022-11-20 (×3): qty 1

## 2022-11-20 MED ORDER — OXYCODONE-ACETAMINOPHEN 5-325 MG PO TABS
1.0000 | ORAL_TABLET | Freq: Four times a day (QID) | ORAL | Status: DC | PRN
  Administered 2022-11-20 – 2022-11-22 (×8): 1 via ORAL
  Filled 2022-11-20 (×8): qty 1

## 2022-11-20 MED ORDER — OXYCODONE HCL 5 MG PO TABS
5.0000 mg | ORAL_TABLET | Freq: Four times a day (QID) | ORAL | Status: DC | PRN
  Administered 2022-11-20 – 2022-11-22 (×7): 5 mg via ORAL
  Filled 2022-11-20 (×7): qty 1

## 2022-11-20 MED ORDER — BUDESONIDE 0.5 MG/2ML IN SUSP
0.5000 mg | Freq: Two times a day (BID) | RESPIRATORY_TRACT | Status: DC
  Administered 2022-11-20 – 2022-11-22 (×5): 0.5 mg via RESPIRATORY_TRACT
  Filled 2022-11-20 (×5): qty 2

## 2022-11-20 MED ORDER — ALBUTEROL SULFATE (2.5 MG/3ML) 0.083% IN NEBU: 2.5000 mg | INHALATION_SOLUTION | RESPIRATORY_TRACT | Status: DC | PRN

## 2022-11-20 MED ORDER — ACETAMINOPHEN 650 MG RE SUPP: 650.0000 mg | Freq: Four times a day (QID) | RECTAL | Status: DC | PRN

## 2022-11-20 MED ORDER — SACUBITRIL-VALSARTAN 97-103 MG PO TABS
1.0000 | ORAL_TABLET | Freq: Two times a day (BID) | ORAL | Status: DC
  Administered 2022-11-20 – 2022-11-21 (×4): 1 via ORAL
  Filled 2022-11-20 (×5): qty 1

## 2022-11-20 NOTE — ED Provider Notes (Signed)
Signed out to me by Dr. Jearld Fenton.  Patient presents to the emergency department for evaluation of persistent shortness of breath, low-grade fevers.  He has chronic respiratory failure, on home O2.  Patient with history of COPD.  Patient treated with a Z-Pak last week and steroids, did not see any improvement.  Patient requiring increased nasal cannula oxygen here in the ED to maintain his oxygen saturations.  CT PE study results: IMPRESSION: 1. No pulmonary embolus. 2. Diffuse bronchial wall thickening with patchy mid to lower lung zone predominant bilateral peribronchovascular ground-glass airspace opacities. Findings suggestive of developing infection/inflammation. Consider repeat CT in 3 months to evaluate for complete resolution. 3. Aortic Atherosclerosis (ICD10-I70.0) and Emphysema (ICD10-J43.9).    Gilda Crease, MD 11/20/22 [phone]

## 2022-11-20 NOTE — ED Notes (Signed)
ED TO INPATIENT HANDOFF REPORT  ED Nurse Name and Phone #: Suann Larry Name/Age/Gender Scott [name] 62 y.o. male Room/Bed: WA16/WA16  Code Status   Code Status: Full Code  Home/SNF/Other Home Patient oriented to: self, place, time, and situation Is this baseline? Yes   Triage Complete: Triage complete  Chief Complaint COPD with acute exacerbation (HCC) [J44.1]  Triage Note Pt presents via POV c/o SOB, fever, and headache x1 weeks. Reports completed abx as OP. Wears 3L 02 at home. Pt has increased WOB. Pausing in between words during triage.    Allergies Allergies  Allergen Reactions   Penicillins Anaphylaxis    Has patient had a PCN reaction causing immediate rash, facial/tongue/throat swelling, SOB or lightheadedness with hypotension: Yes  Has patient had a PCN reaction causing severe rash involving mucus membranes or skin necrosis: Yes  Has patient had a PCN reaction that required hospitalization: Yes  Has patient had a PCN reaction occurring within the last 10 years: No  If all of the above answers are "NO", then may proceed with Cephalosporin use.  Has patient had a PCN reaction causing immediate rash, facial/tongue/throat swelling, SOB or lightheadedness with hypotension: Yes Has patient had a PCN reaction causing severe rash involving mucus membranes or skin necrosis: Yes Has patient had a PCN reaction that required hospitalization: Yes Has patient had a PCN reaction occurring within the last 10 years: No If all of the above answers are "NO", then may proceed with Cephalosporin use.   Cymbalta [Duloxetine Hcl]     "crazy thoughts"    Level of Care/Admitting Diagnosis ED Disposition     ED Disposition  Admit   Condition  --   Comment  Hospital Area: [facility] [[id number]]  Level of Care: Med-Surg [16]  May place patient in observation at [facility] or [facility] if equivalent level of care is available:: Yes  Covid Evaluation: Confirmed  COVID Negative  Diagnosis: COPD with acute exacerbation [facility]) [[id number]]  Admitting Physician: Dorcas Carrow [[id number]]  Attending Physician: Dorcas Carrow [[id number]]          B Medical/Surgery History 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   COPD (chronic obstructive pulmonary disease) (HCC)    Diabetes mellitus without complication (HCC)    GERD (gastroesophageal reflux disease)    Headache(784.0)    Heart attack (HCC)    Pneumonia    hx   Past Surgical History:  Procedure Laterality Date   ANTERIOR CERVICAL DECOMP/DISCECTOMY FUSION  12/22/2010   Procedure: ANTERIOR CERVICAL DECOMPRESSION/DISCECTOMY FUSION 3 LEVELS;  Surgeon: Cristi Loron;  Location: [facility]URO ORS;  Service: Neurosurgery;  Laterality: N/A;  Anterior cervical discectomy with fusion cervical three-four, four-five, and five sixCDF with Interbody Prosthesis, plating, and Bone Graft    ANTERIOR CERVICAL DECOMP/DISCECTOMY FUSION N/A 10/16/2012   Procedure: CERVICAL SIX-SEVEN ANTERIOR CERVICAL DECOMPRESSION/DISCECTOMY FUSION WITH INTERBODY PROTHESIS PLATING BONEGRAFT WITH /POSSIBLE HARDWARE REMOVAL OLD PLATE;  Surgeon: Cristi Loron, MD;  Location: [facility];  Service: Neurosurgery;  Laterality: N/A;   MULTIPLE TOOTH EXTRACTIONS     OTHER SURGICAL HISTORY     surgery on cheekbone and head for fall 2000   OTHER SURGICAL HISTORY     states when about 62yrs old he was urinating blood, 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 and  had surgery for this   [name]/LEFT HEART CATH AND CORONARY ANGIOGRAPHY N/A  09/23/2018   Procedure: [name]/LEFT HEART CATH AND CORONARY ANGIOGRAPHY and possible PCI/stent;  Surgeon: Kathleene Hazel, MD;  Location: [facility] INVASIVE CV LAB;  Service: Cardiovascular;  Laterality: N/A;   SPINE SURGERY     2014 and 2016 - Dr Lovell Sheehan   TONSILLECTOMY       A IV Location/Drains/Wounds Patient Lines/Drains/Airways Status     Active  Line/Drains/Airways     Name Placement date Placement time Site Days   Peripheral IV 11/19/22 18 G Left Antecubital 11/19/22  --  Antecubital  1            Intake/Output Last 24 hours No intake or output data in the 24 hours ending 11/20/22 1135  Labs/Imaging Results for orders placed or performed during the hospital encounter of 11/19/22 (from the past 48 hour(s))  CBC with Differential     Status: Abnormal   Collection Time: 11/19/22  9:02 PM  Result Value Ref Range   WBC 12.1 (H) 4.0 - 10.5 K/uL   RBC 4.61 4.22 - 5.81 MIL/uL   Hemoglobin 13.1 13.0 - 17.0 g/dL   HCT 40.9 81.1 - 91.4 %   MCV 87.9 80.0 - 100.0 fL   MCH 28.4 26.0 - 34.0 pg   MCHC 32.3 30.0 - 36.0 g/dL   RDW 78.2 95.6 - 21.3 %   Platelets 226 150 - 400 K/uL   nRBC 0.0 0.0 - 0.2 %   Neutrophils Relative % 78 %   Neutro Abs 9.5 (H) 1.7 - 7.7 K/uL   Lymphocytes Relative 11 %   Lymphs Abs 1.3 0.7 - 4.0 K/uL   Monocytes Relative 9 %   Monocytes Absolute 1.1 (H) 0.1 - 1.0 K/uL   Eosinophils Relative 0 %   Eosinophils Absolute 0.0 0.0 - 0.5 K/uL   Basophils Relative 1 %   Basophils Absolute 0.1 0.0 - 0.1 K/uL   Immature Granulocytes 1 %   Abs Immature Granulocytes 0.07 0.00 - 0.07 K/uL    Comment: Performed at [facility], [address], [place], [place] [address]  Comprehensive metabolic panel     Status: Abnormal   Collection Time: 11/19/22  9:02 PM  Result Value Ref Range   Sodium 140 135 - 145 mmol/L   Potassium 3.4 (L) 3.5 - 5.1 mmol/L   Chloride 101 98 - 111 mmol/L   CO2 27 22 - 32 mmol/L   Glucose, Bld 107 (H) 70 - 99 mg/dL    Comment: Glucose reference range applies only to samples taken after fasting for at least 8 hours.   BUN 13 8 - 23 mg/dL   Creatinine, Ser 8.46 0.61 - 1.24 mg/dL   Calcium 9.0 8.9 - 96.2 mg/dL   Total Protein 7.7 6.5 - 8.1 g/dL   Albumin 3.7 3.5 - 5.0 g/dL   AST 24 15 - 41 U/L   ALT 17 0 - 44 U/L   Alkaline Phosphatase 84 38 - 126 U/L   Total  Bilirubin 1.6 (H) 0.3 - 1.2 mg/dL   GFR, Estimated >95 >28 mL/min    Comment: (NOTE) Calculated using the CKD-EPI Creatinine Equation (2021)    Anion gap 12 5 - 15    Comment: Performed at [facility], [address] [place], [place] [address]  Brain natriuretic peptide     Status: Abnormal   Collection Time: 11/19/22  9:02 PM  Result Value Ref Range   B Natriuretic Peptide 116.3 (H) 0.0 - 100.0 pg/mL  Comment: Performed at [facility], [address], [place], [place] [address]  SARS Coronavirus 2 by RT PCR (hospital order, performed in [place] hospital lab) *cepheid single result test* Anterior Nasal Swab     Status: None   Collection Time: 11/19/22  9:05 PM   Specimen: Anterior Nasal Swab  Result Value Ref Range   SARS Coronavirus 2 by RT PCR NEGATIVE NEGATIVE    Comment: (NOTE) SARS-CoV-2 target nucleic acids are NOT DETECTED.  The SARS-CoV-2 RNA is generally detectable in upper and lower respiratory specimens during the acute phase of infection. The lowest concentration of SARS-CoV-2 viral copies this assay can detect is 250 copies / mL. A negative result does not preclude SARS-CoV-2 infection and should not be used as the sole basis for treatment or other patient management decisions.  A negative result may occur with improper specimen collection / handling, submission of specimen other than nasopharyngeal swab, presence of viral mutation(s) within the areas targeted by this assay, and inadequate number of viral copies (<250 copies / mL). A negative result must be combined with clinical observations, patient history, and epidemiological information.  Fact Sheet for Patients:   RoadLapTop.co.za  Fact Sheet for Healthcare Providers: http://kim-miller.com/  This test is not yet approved or  cleared by the [place] FDA and has been authorized for detection and/or diagnosis of  SARS-CoV-2 by FDA under an Emergency Use Authorization (EUA).  This EUA will remain in effect (meaning this test can be used) for the duration of the COVID-19 declaration under Section 564(b)(1) of the Act, 21 U.S.C. section 360bbb-3(b)(1), unless the authorization is terminated or revoked sooner.  Performed at [facility], [address], [place], [place] [address]   Blood culture (routine x 2)     Status: None (Preliminary result)   Collection Time: 11/19/22 10:19 PM   Specimen: BLOOD [name] ARM  Result Value Ref Range   Specimen Description      BLOOD [name] ARM Performed at [facility], [address], [place], [place] [address]    Special Requests      Blood Culture adequate volume BOTTLES DRAWN AEROBIC AND ANAEROBIC Performed at [facility], [address], [place], [place] [address]    Culture PENDING    Report Status PENDING   Blood culture (routine x 2)     Status: None (Preliminary result)   Collection Time: 11/19/22 10:19 PM   Specimen: BLOOD  Result Value Ref Range   Specimen Description      BLOOD LEFT ANTECUBITAL Performed at [facility], [address] [place], [place] [address]    Special Requests      Blood Culture adequate volume BOTTLES DRAWN AEROBIC AND ANAEROBIC Performed at [facility], [address], [place], [place] [address]    Culture PENDING    Report Status PENDING   CBC     Status: None   Collection Time: 11/20/22  9:13 AM  Result Value Ref Range   WBC 9.5 4.0 - 10.5 K/uL   RBC 4.59 4.22 - 5.81 MIL/uL   Hemoglobin 13.1 13.0 - 17.0 g/dL   HCT 64.3 32.9 - 51.8 %   MCV 88.9 80.0 - 100.0 fL   MCH 28.5 26.0 - 34.0 pg   MCHC 32.1 30.0 - 36.0 g/dL   RDW 84.1 66.0 - 63.0 %   Platelets 246 150 - 400 K/uL   nRBC 0.0 0.0 - 0.2 %  Comment: Performed at [facility], [address], [place], [place] [address]  Creatinine, serum     Status: None    Collection Time: 11/20/22  9:13 AM  Result Value Ref Range   Creatinine, Ser 0.84 0.61 - 1.24 mg/dL   GFR, Estimated >83 >15 mL/min    Comment: (NOTE) Calculated using the CKD-EPI Creatinine Equation (2021) Performed at [facility], [address] [place], [place] [address]    CT Angio Chest PE W and/or Wo Contrast  Result Date: 11/20/2022 CLINICAL DATA:  Pulmonary embolism (PE) suspected, high prob. Headache shortness of breath fever POV c/o SOB, fever, and headache x1 weeks. Reports completed abx as OP. Wears 3L 02 at home. Pt has increased WOB. Pausing in between words during triage. EXAM: CT ANGIOGRAPHY CHEST WITH CONTRAST TECHNIQUE: Multidetector CT imaging of the chest was performed using the standard protocol during bolus administration of intravenous contrast. Multiplanar CT image reconstructions and MIPs were obtained to evaluate the vascular anatomy. RADIATION DOSE REDUCTION: This exam was performed according to the departmental dose-optimization program which includes automated exposure control, adjustment of the mA and/or kV according to patient size and/or use of iterative reconstruction technique. CONTRAST:  75mL OMNIPAQUE IOHEXOL 350 MG/ML SOLN COMPARISON:  CT chest 04/10/2022. FINDINGS: Cardiovascular: Satisfactory opacification of the pulmonary arteries to the segmental level. No evidence of pulmonary embolism. Normal heart size. No significant pericardial effusion. The thoracic aorta is normal in caliber. No atherosclerotic plaque of the thoracic aorta. No coronary artery calcifications. Mediastinum/Nodes: Prominent mediastinal lymph nodes. No enlarged mediastinal, hilar, or axillary lymph nodes. Thyroid gland, trachea, and esophagus demonstrate no significant findings. Lungs/Pleura: Severe paraseptal and central level emphysematous changes. Diffuse mild bronchial wall thickening. Patchy mid to lower lung zone predominant bilateral peribronchovascular ground-glass  airspace opacities. No focal consolidation. No pulmonary nodule. No pulmonary mass. No pleural effusion. No pneumothorax. Upper Abdomen: No acute abnormality. Musculoskeletal: No chest wall abnormality. No suspicious lytic or blastic osseous lesions. No acute displaced fracture. C6-C7 anterior cervical discectomy and fusion. Review of the MIP images confirms the above findings. IMPRESSION: 1. No pulmonary embolus. 2. Diffuse bronchial wall thickening with patchy mid to lower lung zone predominant bilateral peribronchovascular ground-glass airspace opacities. Findings suggestive of developing infection/inflammation. Consider repeat CT in 3 months to evaluate for complete resolution. 3. Aortic Atherosclerosis (ICD10-I70.0) and Emphysema (ICD10-J43.9). Electronically Signed   By: Tish Frederickson M.D.   On: 11/20/2022 00:39   DG Chest Port 1 View  Result Date: 11/19/2022 CLINICAL DATA:  Short of breath, fever, headache for 1 week EXAM: PORTABLE CHEST 1 VIEW COMPARISON:  04/22/2022 FINDINGS: Two frontal views of the chest demonstrate an unremarkable cardiac silhouette. Lungs are hyperinflated, without acute airspace disease, effusion, or pneumothorax. No acute bony abnormalities. IMPRESSION: 1. Stable hyperinflation consistent with emphysema. No acute airspace disease. Electronically Signed   By: Sharlet Salina M.D.   On: 11/19/2022 21:43    Pending Labs Unresulted Labs (From admission, onward)     Start     Ordered   11/27/22 0500  Creatinine, serum  (enoxaparin (LOVENOX)    CrCl >/= 30 ml/min)  Weekly,   R     Comments: while on enoxaparin therapy    11/20/22 0841   11/21/22 0500  Comprehensive metabolic panel  Tomorrow morning,   R        11/20/22 0841   11/21/22 0500  CBC  Tomorrow morning,   R        11/20/22  1610   11/20/22 0840  HIV Antibody (routine testing w rflx)  (HIV Antibody (Routine testing w reflex) panel)  Once,   R        11/20/22 0841   11/20/22 0831  Hemoglobin A1c  Once,   R        Comments: To assess prior glycemic control    11/20/22 0831            Vitals/Pain Today's Vitals   11/20/22 1012 11/20/22 1022 11/20/22 1042 11/20/22 1054  BP:    122/86  Pulse:    (!) 112  Resp:    (!) 21  Temp: 98 F (36.7 C)     TempSrc: Oral     SpO2:    94%  Weight:  79.9 kg    Height:  5\' 8"  (1.727 m)    PainSc:   9      Isolation Precautions No active isolations  Medications Medications  insulin aspart (novoLOG) injection 0-15 Units (has no administration in time range)  insulin aspart (novoLOG) injection 0-5 Units (has no administration in time range)  levofloxacin (LEVAQUIN) IVPB 750 mg (has no administration in time range)  budesonide (PULMICORT) nebulizer solution 0.5 mg (0.5 mg Nebulization Given 11/20/22 0909)  ipratropium-albuterol (DUONEB) 0.5-2.5 (3) MG/3ML nebulizer solution 3 mL (3 mLs Nebulization Given 11/20/22 0909)  albuterol (PROVENTIL) (2.5 MG/3ML) 0.083% nebulizer solution 2.5 mg (has no administration in time range)  aspirin EC tablet 81 mg (81 mg Oral Given 11/20/22 1047)  bisoprolol (ZEBETA) tablet 5 mg (5 mg Oral Given 11/20/22 1052)  furosemide (LASIX) tablet 10 mg (10 mg Oral Given 11/20/22 1044)  rosuvastatin (CRESTOR) tablet 10 mg (10 mg Oral Given 11/20/22 1046)  sacubitril-valsartan (ENTRESTO) 97-103 mg per tablet (1 tablet Oral Given 11/20/22 1043)  pantoprazole (PROTONIX) EC tablet 40 mg (40 mg Oral Given 11/20/22 1046)  baclofen (LIORESAL) tablet 10 mg (10 mg Oral Patient Refused/Not Given 11/20/22 1048)  gabapentin (NEURONTIN) capsule 300 mg (300 mg Oral Given 11/20/22 1045)  linagliptin (TRADJENTA) tablet 5 mg (5 mg Oral Given 11/20/22 1052)  methylPREDNISolone sodium succinate (SOLU-MEDROL) 40 mg/mL injection 40 mg (40 mg Intravenous Given 11/20/22 1052)  enoxaparin (LOVENOX) injection 40 mg (40 mg Subcutaneous Patient Refused/Not Given 11/20/22 1049)  sodium chloride flush (NS) 0.9 % injection 3 mL (3 mLs Intravenous Given  11/20/22 1053)  acetaminophen (TYLENOL) tablet 650 mg (has no administration in time range)    Or  acetaminophen (TYLENOL) suppository 650 mg (has no administration in time range)  ondansetron (ZOFRAN) tablet 4 mg (has no administration in time range)    Or  ondansetron (ZOFRAN) injection 4 mg (has no administration in time range)  oxyCODONE-acetaminophen (PERCOCET/ROXICET) 5-325 MG per tablet 1 tablet (1 tablet Oral Given 11/20/22 1042)    And  oxyCODONE (Oxy IR/ROXICODONE) immediate release tablet 5 mg (has no administration in time range)  ipratropium-albuterol (DUONEB) 0.5-2.5 (3) MG/3ML nebulizer solution 3 mL (3 mLs Nebulization Given 11/19/22 2230)  methylPREDNISolone sodium succinate (SOLU-MEDROL) 125 mg/2 mL injection 125 mg (125 mg Intravenous Given 11/19/22 2213)  iohexol (OMNIPAQUE) 350 MG/ML injection 75 mL (75 mLs Intravenous Contrast Given 11/19/22 2346)  levofloxacin (LEVAQUIN) IVPB 750 mg (0 mg Intravenous Stopped 11/20/22 0517)    Mobility walks with person assist     Focused Assessments Shortness of Breath   R Recommendations: See Admitting Provider Note  Report given to:   Additional Notes:

## 2022-11-20 NOTE — Plan of Care (Signed)

## 2022-11-20 NOTE — H&P (Signed)
History and Physical    Patient: Scott [name] ION:[id number] DOB: 1960/07/17 DOA: 11/19/2022 DOS: the patient was seen and examined on 11/20/2022 PCP: Patient, No Pcp Per  Patient coming from: Home Anticipated discharge disposition: Home  Chief Complaint:  Chief Complaint  Patient presents with   Shortness of Breath   HPI: Scott [name] is a 62 y.o. male with medical history significant of COPD on chronic oxygen primarily with ambulation, chronic pain cervical neck, hypertension,HFpEF, diabetes mellitus on oral agents, and reflux.  Patient presented to the ED with reports of shortness of breath, fevers and headache 1 week.  Patient reports current symptoms are different from his usual COPD and he was more hypoxic than typically if he is on room air noting sats down to 87%.  Patient had some leftover azithromycin at home which she took but these did not help his symptoms.  Also his usual inhalers have not helped his symptoms as well.  Patient was concerned that the symptoms may be predictive of an evolving pneumonia therefore he presented to the ER for further evaluation and treatment.  In the ER patient had a Tmax of 100 F.  White count was 12,100.  Plain x-ray without any focal infiltrates but CTA of the chest concerning for evolving infection with finding of diffuse bronchial wall thickening and patchy mid to lower lung zone bilateral  peribronchovascular groundglass airspace opacities.  Patient has been given nebulizers, IV steroids and a dose of IV Levaquin for his symptoms.  Hospital service has been asked to evaluate the patient for admission.  Upon my evaluation of the patient he was having issues with paroxysmal coughing.  On exam he was found to have bilateral crackles on posterior auscultation.  He was reporting thick green productive cough prior to admission.  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   COPD (chronic obstructive pulmonary disease) (HCC)    Diabetes mellitus without complication (HCC)    GERD (gastroesophageal reflux disease)    Headache(784.0)    Heart attack (HCC)    Pneumonia    hx   Past Surgical History:  Procedure Laterality Date   ANTERIOR CERVICAL DECOMP/DISCECTOMY FUSION  12/22/2010   Procedure: ANTERIOR CERVICAL DECOMPRESSION/DISCECTOMY FUSION 3 LEVELS;  Surgeon: Cristi Loron;  Location: [facility];  Service: Neurosurgery;  Laterality: N/A;  Anterior cervical discectomy with fusion cervical three-four, four-five, and five sixCDF with Interbody Prosthesis, plating, and Bone Graft    ANTERIOR CERVICAL DECOMP/DISCECTOMY FUSION N/A 10/16/2012   Procedure: CERVICAL SIX-SEVEN ANTERIOR CERVICAL DECOMPRESSION/DISCECTOMY FUSION WITH INTERBODY PROTHESIS PLATING BONEGRAFT WITH /POSSIBLE HARDWARE REMOVAL OLD PLATE;  Surgeon: Cristi Loron, MD;  Location: [facility];  Service: Neurosurgery;  Laterality: N/A;   MULTIPLE TOOTH EXTRACTIONS     OTHER SURGICAL HISTORY     surgery on cheekbone and head for fall 2000   OTHER SURGICAL HISTORY     states when about 62yrs old he was urinating blood, 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 His smoking use included cigarettes. He started smoking about 49 years ago. He has a 7 pack-year smoking history. He has never used smokeless tobacco. He reports that he does not currently use alcohol. He reports that he does not use drugs.  Allergies  Allergen Reactions   Penicillins Anaphylaxis    Has patient  had a PCN reaction causing immediate rash, facial/tongue/throat swelling, SOB or lightheadedness with hypotension: Yes  Has patient had a PCN reaction causing severe rash involving mucus membranes or skin necrosis: Yes  Has patient had a PCN reaction that required hospitalization: Yes  Has patient had a PCN reaction occurring within the last 10 years: No  If all of the above answers are "NO", then may proceed with Cephalosporin use.  Has patient had a PCN reaction causing immediate rash, facial/tongue/throat swelling, SOB or lightheadedness with hypotension: Yes Has patient had a PCN reaction causing severe rash involving mucus membranes or skin necrosis: Yes Has patient had a PCN reaction that required hospitalization: Yes Has patient had a PCN reaction occurring within the last 10 years: No If all of the above answers are "NO", then may proceed with Cephalosporin use.   Cymbalta [Duloxetine Hcl]     "crazy thoughts"    Family History  Problem Relation Age of Onset   Emphysema Mother    COPD Mother    Asthma Brother    Diabetes Brother    Hyperlipidemia Brother    Hypertension Brother    Asthma Sister    Hyperlipidemia Sister    Hypertension Sister    Mental illness Sister    Heart disease Maternal Uncle    Hypertension Brother     Prior to Admission medications   Medication Sig Start Date End Date Taking? Authorizing Provider  aspirin EC 81 MG tablet Take 81 mg by mouth daily.   Yes [provider]  baclofen (LIORESAL) 10 MG tablet Take 10 mg by mouth 2 (two) times daily. 04/03/22  Yes [provider]  bisoprolol (ZEBETA) 5 MG tablet Take 1 tablet (5 mg total) by mouth daily. 09/15/22  Yes Rollene Rotunda, MD  clonazepam (KLONOPIN) 0.125 MG disintegrating tablet Take 0.125 mg by mouth 2 (two) times daily as needed. 10/27/22  Yes [provider]  fluticasone (FLONASE) 50 MCG/ACT nasal spray USE 2 SPRAYS NASALLY EVERY DAY 05/25/21  Yes Leslye Peer, MD   fluticasone-salmeterol (ADVAIR) 250-50 MCG/ACT AEPB Inhale 1 puff into the lungs every 12 (twelve) hours. 08/30/22  Yes Leslye Peer, MD  furosemide (LASIX) 20 MG tablet Take 1 tablet (20 mg total) by mouth every other day. Patient taking differently: Take 10 mg by mouth daily. 09/15/22  Yes Rollene Rotunda, MD  gabapentin (NEURONTIN) 300 MG capsule TAKE 1 CAPSULE BY MOUTH 3 TIMES DAILY 11/21/18  Yes Lezlie Lye, Irma M, MD  Ipratropium-Albuterol (COMBIVENT RESPIMAT) 20-100 MCG/ACT AERS respimat INHALE 1 PUFF BY MOUTH FOUR TIMES DAILY 08/30/22  Yes Leslye Peer, MD  ipratropium-albuterol (DUONEB) 0.5-2.5 (3) MG/3ML SOLN Inhale 1 vial via nebulizer every 6 hours as needed 07/08/21  Yes Byrum, Les Pou, MD  JANUMET 50-500 MG tablet TAKE 1 TABLET BY MOUTH ONCE A DAY 07/31/18  Yes Lezlie Lye, Meda Coffee, MD  [facility] 4 MG/0.1ML LIQD nasal spray kit Place 1 spray into the nose daily as needed (For overdose).  06/13/18  Yes [provider]  omeprazole (PRILOSEC) 40 MG capsule TAKE 1 CAPSULE  BY MOUTH EVERY DAY 01/08/19  Yes Lezlie Lye, Meda Coffee, MD  oxyCODONE-acetaminophen (PERCOCET) 10-325 MG tablet Take 1 tablet by mouth every 6 (six) hours as needed for pain.   Yes [provider]  rosuvastatin (CRESTOR) 10 MG tablet Take 1 tablet (10 mg total) by mouth daily. 09/13/21  Yes Monge, Petra Kuba, NP  sacubitril-valsartan (ENTRESTO) 97-103 MG Take 1 tablet by mouth 2 (two) times daily. 09/15/22  Yes Hochrein, Fayrene Fearing, MD  VENTOLIN HFA 108 (90 Base) MCG/ACT inhaler INHALE 1 TO 2 PUFFS BY MOUTH EVERY 6 HOURS AS NEEDED FOR WHEEZING OR SHORTNESS OF BREATH 06/06/22  Yes Byrum, Les Pou, MD  Vitamin D, Ergocalciferol, (DRISDOL) 1.25 MG (50000 UT) CAPS capsule TAKE ONE CAPSULE BY MOUTH ONCE WEEKLY 12/18/18  Yes Lezlie Lye, Irma M, MD  fluticasone-salmeterol (ADVAIR) 250-50 MCG/ACT AEPB Inhale 1 puff into the lungs in the morning and at bedtime. Patient not taking: Reported on 11/20/2022 08/16/22   Parrett,  Virgel Bouquet, NP  predniSONE (DELTASONE) 20 MG tablet Take 1 tablet (20 mg total) by mouth daily with breakfast. Patient not taking: Reported on 08/21/2022 08/16/22   Julio Sicks, NP    Physical Exam: Vitals:   11/20/22 0630 11/20/22 1012 11/20/22 1022 11/20/22 1054  BP: 110/64   122/86  Pulse: 92   (!) 112  Resp: 18   (!) 21  Temp: 98.1 F (36.7 C) 98 F (36.7 C)    TempSrc:  Oral    SpO2: 95%   94%  Weight:   79.9 kg   Height:   5\' 8"  (1.727 m)    Constitutional: NAD, calm, comfortable ENT: Irregular pattern yellowish-white changes/coating to the surface of the tongue Respiratory: Bilateral crackles on posterior exam.  Somewhat diminished in the bases.  Current O2 sats stable on 2 to 3 L of nasal cannula oxygen.  No increased work of breathing at rest.  No tachypnea. Cardiovascular: Regular rate and rhythm, no murmurs / rubs / gallops. No extremity edema. 2+ pedal pulses.  Abdomen: no tenderness, no masses palpated. No hepatosplenomegaly. Bowel sounds positive.  Musculoskeletal: no clubbing / cyanosis. No joint deformity upper and lower extremities. Good ROM, no contractures. Normal muscle tone.  Skin: no rashes, lesions, ulcers. No induration Neurologic: CN 2-12 grossly intact. Sensation intact, Strength 5/5 x all 4 extremities.  Psychiatric: Normal judgment and insight. Alert and oriented x 3. Normal mood.     Data Reviewed:  Sodium 140, potassium 3.4, CO2 27, glucose 107, BUN 13, creatinine 0.98, LFTs are normal, total bilirubin slightly elevated at 1.6  Hemoglobin A1c 6.3  Initial white count 12,100 and since admission has decreased to 9500, hemoglobin 13.1, platelets 246,000  Respiratory panel pending, COVID PCR negative  Blood cultures x 2 have been obtained  Chest x-ray and CT of the chest as above  Assessment and Plan: Acute on chronic hypoxemic respiratory failure secondary to: COPD exacerbation URI Typically at home patient states uses oxygen for activity but  now is requiring continuously Continue oxygen and titrate back to home levels as tolerated Continue IV Levaquin In lieu of home MDIs will utilize the following regimen to help with acute respiratory symptoms: Budesonide neb every 12 hours, DuoNebs every 6 hours and albuterol neb every 2 hours as needed for breakthrough wheezing or shortness of breath IV Solu-Medrol 40 mg twice daily and transition to prednisone as improves clinically Follow-up on respiratory viral panel as well as blood cultures  HFpEF No clinical signs consistent with heart  failure exacerbation and patient states current respiratory symptoms are not consistent with prior heart failure episodes Continue preadmission Entresto and Zebeta as well as low-dose Lasix Daily weights and strict I's/O  Diabetes mellitus on oral agents Hemoglobin A1c 6.3 Monitor for hyperglycemia in context of inhaled as well as systemic steroids Follow CBGs AC/HS Patient on Januvia met prior to admission-hold metformin given recent CT with IV contrast.  Therapeutic substitution of Januvia for Tradjenta per [facility] policy  Chronic cervical neck pain Continue home Percocet as well as gabapentin and as needed baclofen  GERD Initiate Protonix which is a therapeutic substitution for home omeprazole   Advance Care Planning:   Code Status: Full Code   VTE prophylaxis: Lovenox  Consults: None  Family Communication: Patient only  Severity of Illness: The appropriate patient status for this patient is OBSERVATION. Observation status is judged to be reasonable and necessary in order to provide the required intensity of service to ensure the patient's safety. The patient's presenting symptoms, physical exam findings, and initial radiographic and laboratory data in the context of their medical condition is felt to place them at decreased risk for further clinical deterioration. Furthermore, it is anticipated that the patient will be medically stable for  discharge from the hospital within 2 midnights of admission.   Author: Junious Silk, NP 11/20/2022 12:56 PM  For on call review [email or web address].

## 2022-11-21 DIAGNOSIS — J441 Chronic obstructive pulmonary disease with (acute) exacerbation: Secondary | ICD-10-CM | POA: Diagnosis not present

## 2022-11-21 LAB — COMPREHENSIVE METABOLIC PANEL
ALT: 21 U/L (ref 0–44)
AST: 29 U/L (ref 15–41)
Albumin: 3.1 g/dL — ABNORMAL LOW (ref 3.5–5.0)
Alkaline Phosphatase: 69 U/L (ref 38–126)
Anion gap: 12 (ref 5–15)
BUN: 25 mg/dL — ABNORMAL HIGH (ref 8–23)
CO2: 27 mmol/L (ref 22–32)
Calcium: 8.9 mg/dL (ref 8.9–10.3)
Chloride: 102 mmol/L (ref 98–111)
Creatinine, Ser: 0.97 mg/dL (ref 0.61–1.24)
GFR, Estimated: >60 mL/min (ref >60)
Glucose, Bld: 220 mg/dL — ABNORMAL HIGH (ref 70–99)
Potassium: 3 mmol/L — ABNORMAL LOW (ref 3.5–5.1)
Sodium: 141 mmol/L (ref 135–145)
Total Bilirubin: 0.6 mg/dL (ref 0.3–1.2)
Total Protein: 6.8 g/dL (ref 6.5–8.1)

## 2022-11-21 LAB — CBC
HCT: 37.9 % — ABNORMAL LOW (ref 39.0–52.0)
Hemoglobin: 12 g/dL — ABNORMAL LOW (ref 13.0–17.0)
MCH: 28.1 pg (ref 26.0–34.0)
MCHC: 31.7 g/dL (ref 30.0–36.0)
MCV: 88.8 fL (ref 80.0–100.0)
Platelets: 263 10*3/uL (ref 150–400)
RBC: 4.27 MIL/uL (ref 4.22–5.81)
RDW: 13.9 % (ref 11.5–15.5)
WBC: 14.6 10*3/uL — ABNORMAL HIGH (ref 4.0–10.5)
nRBC: 0 % (ref 0.0–0.2)

## 2022-11-21 LAB — GLUCOSE, CAPILLARY
Glucose-Capillary: 183 mg/dL — ABNORMAL HIGH (ref 70–99)
Glucose-Capillary: 182 mg/dL — ABNORMAL HIGH (ref 70–99)
Glucose-Capillary: 177 mg/dL — ABNORMAL HIGH (ref 70–99)
Glucose-Capillary: 171 mg/dL — ABNORMAL HIGH (ref 70–99)

## 2022-11-21 MED ORDER — LEVOFLOXACIN 750 MG PO TABS
750.0000 mg | ORAL_TABLET | Freq: Every day | ORAL | Status: DC
  Administered 2022-11-22: 750 mg via ORAL
  Filled 2022-11-21: qty 1

## 2022-11-21 MED ORDER — POTASSIUM CHLORIDE CRYS ER 20 MEQ PO TBCR
40.0000 meq | EXTENDED_RELEASE_TABLET | Freq: Two times a day (BID) | ORAL | Status: DC
  Administered 2022-11-21 (×2): 40 meq via ORAL
  Filled 2022-11-21 (×3): qty 2

## 2022-11-21 MED ORDER — IPRATROPIUM-ALBUTEROL 0.5-2.5 (3) MG/3ML IN SOLN
3.0000 mL | Freq: Two times a day (BID) | RESPIRATORY_TRACT | Status: DC
  Administered 2022-11-22: 3 mL via RESPIRATORY_TRACT
  Filled 2022-11-21: qty 3

## 2022-11-21 NOTE — Plan of Care (Signed)
  Problem: Fluid Volume: Goal: Ability to maintain a balanced intake and output will improve Outcome: Progressing   Problem: Health Behavior/Discharge Planning: Goal: Ability to identify and utilize available resources and services will improve Outcome: Progressing   Problem: Health Behavior/Discharge Planning: Goal: Ability to manage health-related needs will improve Outcome: Progressing   Problem: Nutritional: Goal: Maintenance of adequate nutrition will improve Outcome: Progressing   Problem: Nutritional: Goal: Progress toward achieving an optimal weight will improve Outcome: Progressing   Problem: Education: Goal: Ability to describe self-care measures that may prevent or decrease complications (Diabetes Survival Skills Education) will improve Outcome: Adequate for Discharge   Problem: Coping: Goal: Ability to adjust to condition or change in health will improve Outcome: Adequate for Discharge

## 2022-11-21 NOTE — Progress Notes (Signed)
PHARMACIST - PHYSICIAN COMMUNICATION   CONCERNING: Antibiotic IV to Oral Route Change Policy   RECOMMENDATION: This patient is receiving levofloxacin by the intravenous route.  Based on criteria approved by the Pharmacy and Therapeutics Committee, the antibiotic(s) is/are being converted to the equivalent oral dose form(s).     DESCRIPTION:  These criteria include: Patient being treated for a respiratory tract infection, urinary tract infection, cellulitis or clostridium difficile associated diarrhea if on metronidazole The patient is not neutropenic and does not exhibit a GI malabsorption state The patient is eating (either orally or via tube) and/or has been taking other orally administered medications for a least 24 hours The patient is improving clinically and has a Tmax < 100.5   If you have questions about this conversion, please contact the Pharmacy Department  []   [phone] )  [facility] []   [phone] )  [facility] []   [phone] )  [facility] []   [phone] )  [facility] [x]   [phone] )  [facility]    Sharin Mons, PharmD, [place], [place] Infectious Diseases Clinical Pharmacist Phone: [phone]] 11/21/2022 1:04 PM

## 2022-11-21 NOTE — Progress Notes (Signed)
PROGRESS NOTE    Scott [name]  JWJ:[id number] DOB: [birth year] DOA: 11/19/2022 PCP: Patient, No Pcp Per    Brief Narrative:  62 year old with history of COPD on chronic oxygen therapy primarily on ambulation, chronic pain syndrome on Percocet, hypertension, chronic diastolic heart failure on treatment, type 2 diabetes on oral agents presented with about 1 week of shortness of breath, fever and change in color of the sputum.  In the emergency room hemodynamically stable.  Temperature 100.  CT scan with diffuse bronchial wall thickening and bilateral peribronchovascular groundglass opacities.  Admitted with COPD exacerbation secondary to pneumonia.  Subjective: Patient seen and examined.  Afebrile.  Very worried about diagnosis of pneumonia.  Remains afebrile.  On minimal oxygen. He is very concerned about getting prescription of Percocet on discharge.  Looks like currently he does not have a pain management clinic.  Assessment & Plan:   COPD with acute exacerbation: Aggressive bronchodilator therapy, IV steroids, inhalational steroids, scheduled and as needed bronchodilators, deep breathing exercises, incentive spirometry, chest physiotherapy. Antibiotics due to severity of symptoms.  Will continue on Levaquin given change in his sputum production and low-grade fever.  It can be oral antibiotics. Supplemental oxygen to keep saturations more than 90%. COVID-19 negative, respiratory virus panel negative.   Mobilize in the hallway.  Chronic diastolic dysfunction: Patient is euvolemic on maintenance dose of Lasix, continue Entresto and Zumbrota. Type 2 diabetes, stable.  On Januvia and metformin.  Remains on sliding scale insulin while in the hospital. Reflux, on PPI  Chronic pain syndrome: Uses gabapentin, baclofen and Percocet at home.  He wants Percocet prescription until next primary care follow-up.  Will review his narcotic history and provide short-term therapy.    DVT prophylaxis:  enoxaparin (LOVENOX) injection 40 mg Start: 11/20/22 1000   Code Status: Full code Family Communication: None at the bedside Disposition Plan: Status is: Inpatient Remains inpatient appropriate because: Significant cough and wheezing     Consultants:  None  Procedures:  None  Antimicrobials:  Levaquin 10/28---     Objective: Vitals:   11/21/22 0143 11/21/22 0559 11/21/22 0831 11/21/22 1148  BP: (!) 146/126 118/79  108/62  Pulse: 90 87  78  Resp: 18 16  19   Temp: 98.1 F (36.7 C) 98 F (36.7 C)  98.7 F (37.1 C)  TempSrc: Oral Oral  Oral  SpO2: 92% 95% 93% 94%  Weight:      Height:        Intake/Output Summary (Last 24 hours) at 11/21/2022 1347 Last data filed at 11/20/2022 2136 Gross per 24 hour  Intake 477 ml  Output --  Net 477 ml   Filed Weights   11/20/22 1022  Weight: 79.9 kg    Examination:  General exam: Appears calm and comfortable  Able to talk in complete sentences. Respiratory system: Not in any distress.  Upper airway sounds.  He has some end expiratory wheezes. Cardiovascular system: S1 & S2 heard, RRR.  Gastrointestinal system: Soft.  Nontender. Central nervous system: Alert and oriented. No focal neurological deficits. Extremities: Symmetric 5 x 5 power. Skin: No rashes, lesions or ulcers Psychiatry: Judgement and insight appear normal. Mood & affect appropriate.     Data Reviewed: I have personally reviewed following labs and imaging studies  CBC: Recent Labs  Lab 11/19/22 2102 11/20/22 0913 11/21/22 0523  WBC 12.1* 9.5 14.6*  NEUTROABS 9.5*  --   --   HGB 13.1 13.1 12.0*  HCT 40.5 40.8 37.9*  MCV 87.9 88.9  88.8  PLT 226 246 263   Basic Metabolic Panel: Recent Labs  Lab 11/19/22 2102 11/20/22 0913 11/21/22 0523  NA 140  --  141  K 3.4*  --  3.0*  CL 101  --  102  CO2 27  --  27  GLUCOSE 107*  --  220*  BUN 13  --  25*  CREATININE 0.98 0.84 0.97  CALCIUM 9.0  --  8.9   GFR: Estimated Creatinine Clearance:  77.4 mL/min (by C-G formula based on SCr of 0.97 mg/dL). Liver Function Tests: Recent Labs  Lab 11/19/22 2102 11/21/22 0523  AST 24 29  ALT 17 21  ALKPHOS 84 69  BILITOT 1.6* 0.6  PROT 7.7 6.8  ALBUMIN 3.7 3.1*   No results for input(s): "LIPASE", "AMYLASE" in the last 168 hours. No results for input(s): "AMMONIA" in the last 168 hours. Coagulation Profile: No results for input(s): "INR", "PROTIME" in the last 168 hours. Cardiac Enzymes: No results for input(s): "CKTOTAL", "CKMB", "CKMBINDEX", "TROPONINI" in the last 168 hours. BNP (last 3 results) No results for input(s): "PROBNP" in the last 8760 hours. HbA1C: Recent Labs    11/20/22 0831  HGBA1C 6.3*   CBG: Recent Labs  Lab 11/20/22 1208 11/20/22 1634 11/20/22 2037 11/21/22 0746 11/21/22 1148  GLUCAP 203* 153* 251* 183* 182*   Lipid Profile: No results for input(s): "CHOL", "HDL", "LDLCALC", "TRIG", "CHOLHDL", "LDLDIRECT" in the last 72 hours. Thyroid Function Tests: No results for input(s): "TSH", "T4TOTAL", "FREET4", "T3FREE", "THYROIDAB" in the last 72 hours. Anemia Panel: No results for input(s): "VITAMINB12", "FOLATE", "FERRITIN", "TIBC", "IRON", "RETICCTPCT" in the last 72 hours. Sepsis Labs: No results for input(s): "PROCALCITON", "LATICACIDVEN" in the last 168 hours.  Recent Results (from the past 240 hour(s))  SARS Coronavirus 2 by RT PCR (hospital order, performed in [facility] hospital lab) *cepheid single result test* Anterior Nasal Swab     Status: None   Collection Time: 11/19/22  9:05 PM   Specimen: Anterior Nasal Swab  Result Value Ref Range Status   SARS Coronavirus 2 by RT PCR NEGATIVE NEGATIVE Final    Comment: (NOTE) SARS-CoV-2 target nucleic acids are NOT DETECTED.  The SARS-CoV-2 RNA is generally detectable in upper and lower respiratory specimens during the acute phase of infection. The lowest concentration of SARS-CoV-2 viral copies this assay can detect is 250 copies / mL. A  negative result does not preclude SARS-CoV-2 infection and should not be used as the sole basis for treatment or other patient management decisions.  A negative result may occur with improper specimen collection / handling, submission of specimen other than nasopharyngeal swab, presence of viral mutation(s) within the areas targeted by this assay, and inadequate number of viral copies (<250 copies / mL). A negative result must be combined with clinical observations, patient history, and epidemiological information.  Fact Sheet for Patients:   RoadLapTop.co.za  Fact Sheet for Healthcare Providers: http://kim-miller.com/  This test is not yet approved or  cleared by the [place] FDA and has been authorized for detection and/or diagnosis of SARS-CoV-2 by FDA under an Emergency Use Authorization (EUA).  This EUA will remain in effect (meaning this test can be used) for the duration of the COVID-19 declaration under Section 564(b)(1) of the Act, 21 U.S.C. section 360bbb-3(b)(1), unless the authorization is terminated or revoked sooner.  Performed at [facility], [address] [place], [place] [address]   Respiratory (~20 pathogens) panel by PCR     Status:  None   Collection Time: 11/19/22  9:07 PM   Specimen: Nasopharyngeal Swab; Respiratory  Result Value Ref Range Status   Adenovirus NOT DETECTED NOT DETECTED Final   Coronavirus 229E NOT DETECTED NOT DETECTED Final    Comment: (NOTE) The Coronavirus on the Respiratory Panel, DOES NOT test for the novel  Coronavirus (2019 nCoV)    Coronavirus HKU1 NOT DETECTED NOT DETECTED Final   Coronavirus NL63 NOT DETECTED NOT DETECTED Final   Coronavirus OC43 NOT DETECTED NOT DETECTED Final   Metapneumovirus NOT DETECTED NOT DETECTED Final   Rhinovirus / Enterovirus NOT DETECTED NOT DETECTED Final   Influenza A NOT DETECTED NOT DETECTED Final   Influenza B NOT DETECTED  NOT DETECTED Final   Parainfluenza Virus 1 NOT DETECTED NOT DETECTED Final   Parainfluenza Virus 2 NOT DETECTED NOT DETECTED Final   Parainfluenza Virus 3 NOT DETECTED NOT DETECTED Final   Parainfluenza Virus 4 NOT DETECTED NOT DETECTED Final   Respiratory Syncytial Virus NOT DETECTED NOT DETECTED Final   Bordetella pertussis NOT DETECTED NOT DETECTED Final   Bordetella Parapertussis NOT DETECTED NOT DETECTED Final   Chlamydophila pneumoniae NOT DETECTED NOT DETECTED Final   Mycoplasma pneumoniae NOT DETECTED NOT DETECTED Final    Comment: Performed at [facility], [address] [place], [place] [address]  Blood culture (routine x 2)     Status: None (Preliminary result)   Collection Time: 11/19/22 10:19 PM   Specimen: BLOOD RIGHT ARM  Result Value Ref Range Status   Specimen Description   Final    BLOOD RIGHT ARM Performed at [facility], [address] [place], [place] [address]    Special Requests   Final    Blood Culture adequate volume BOTTLES DRAWN AEROBIC AND ANAEROBIC Performed at [facility], [address], [place], [place] [address]    Culture PENDING  Incomplete   Report Status PENDING  Incomplete  Blood culture (routine x 2)     Status: None (Preliminary result)   Collection Time: 11/19/22 10:19 PM   Specimen: BLOOD  Result Value Ref Range Status   Specimen Description   Final    BLOOD LEFT ANTECUBITAL Performed at [facility], [address] [place], [place] [address]    Special Requests   Final    Blood Culture adequate volume BOTTLES DRAWN AEROBIC AND ANAEROBIC Performed at [facility], [address] [place], [place] [address]    Culture PENDING  Incomplete   Report Status PENDING  Incomplete         Radiology Studies: CT Angio Chest PE W and/or Wo Contrast  Result Date: 11/20/2022 CLINICAL DATA:  Pulmonary embolism (PE) suspected, high prob. Headache shortness of breath fever POV c/o  SOB, fever, and headache x1 weeks. Reports completed abx as OP. Wears 3L 02 at home. Pt has increased WOB. Pausing in between words during triage. EXAM: CT ANGIOGRAPHY CHEST WITH CONTRAST TECHNIQUE: Multidetector CT imaging of the chest was performed using the standard protocol during bolus administration of intravenous contrast. Multiplanar CT image reconstructions and MIPs were obtained to evaluate the vascular anatomy. RADIATION DOSE REDUCTION: This exam was performed according to the departmental dose-optimization program which includes automated exposure control, adjustment of the mA and/or kV according to patient size and/or use of iterative reconstruction technique. CONTRAST:  75mL OMNIPAQUE IOHEXOL 350 MG/ML SOLN COMPARISON:  CT chest 04/10/2022. FINDINGS: Cardiovascular: Satisfactory opacification of the pulmonary arteries to the segmental level. No  evidence of pulmonary embolism. Normal heart size. No significant pericardial effusion. The thoracic aorta is normal in caliber. No atherosclerotic plaque of the thoracic aorta. No coronary artery calcifications. Mediastinum/Nodes: Prominent mediastinal lymph nodes. No enlarged mediastinal, hilar, or axillary lymph nodes. Thyroid gland, trachea, and esophagus demonstrate no significant findings. Lungs/Pleura: Severe paraseptal and central level emphysematous changes. Diffuse mild bronchial wall thickening. Patchy mid to lower lung zone predominant bilateral peribronchovascular ground-glass airspace opacities. No focal consolidation. No pulmonary nodule. No pulmonary mass. No pleural effusion. No pneumothorax. Upper Abdomen: No acute abnormality. Musculoskeletal: No chest wall abnormality. No suspicious lytic or blastic osseous lesions. No acute displaced fracture. C6-C7 anterior cervical discectomy and fusion. Review of the MIP images confirms the above findings. IMPRESSION: 1. No pulmonary embolus. 2. Diffuse bronchial wall thickening with patchy mid to lower  lung zone predominant bilateral peribronchovascular ground-glass airspace opacities. Findings suggestive of developing infection/inflammation. Consider repeat CT in 3 months to evaluate for complete resolution. 3. Aortic Atherosclerosis (ICD10-I70.0) and Emphysema (ICD10-J43.9). Electronically Signed   By: Tish Frederickson M.D.   On: 11/20/2022 00:39   DG Chest Port 1 View  Result Date: 11/19/2022 CLINICAL DATA:  Short of breath, fever, headache for 1 week EXAM: PORTABLE CHEST 1 VIEW COMPARISON:  04/22/2022 FINDINGS: Two frontal views of the chest demonstrate an unremarkable cardiac silhouette. Lungs are hyperinflated, without acute airspace disease, effusion, or pneumothorax. No acute bony abnormalities. IMPRESSION: 1. Stable hyperinflation consistent with emphysema. No acute airspace disease. Electronically Signed   By: Sharlet Salina M.D.   On: 11/19/2022 21:43        Scheduled Meds:  aspirin EC  81 mg Oral Daily   baclofen  10 mg Oral BID   bisoprolol  5 mg Oral Daily   budesonide  0.5 mg Nebulization BID   enoxaparin (LOVENOX) injection  40 mg Subcutaneous Q24H   furosemide  10 mg Oral Daily   gabapentin  300 mg Oral TID   insulin aspart  0-15 Units Subcutaneous TID WC   insulin aspart  0-5 Units Subcutaneous QHS   ipratropium-albuterol  3 mL Nebulization Q6H   [START ON 11/22/2022] levofloxacin  750 mg Oral Daily   linagliptin  5 mg Oral Daily   methylPREDNISolone (SOLU-MEDROL) injection  40 mg Intravenous Q12H   pantoprazole  40 mg Oral Daily   potassium chloride  40 mEq Oral BID   rosuvastatin  10 mg Oral Daily   sacubitril-valsartan  1 tablet Oral BID   sodium chloride flush  3 mL Intravenous Q12H   Continuous Infusions:   LOS: 1 day    Time spent: 35 minutes    Dorcas Carrow, MD [facility]

## 2022-11-22 DIAGNOSIS — J441 Chronic obstructive pulmonary disease with (acute) exacerbation: Secondary | ICD-10-CM | POA: Diagnosis not present

## 2022-11-22 LAB — GLUCOSE, CAPILLARY: Glucose-Capillary: 135 mg/dL — ABNORMAL HIGH (ref 70–99)

## 2022-11-22 MED ORDER — PREDNISONE 20 MG PO TABS
20.0000 mg | ORAL_TABLET | Freq: Every day | ORAL | 0 refills | Status: AC
Start: 2022-11-22 — End: 2022-11-25

## 2022-11-22 MED ORDER — OXYCODONE-ACETAMINOPHEN 10-325 MG PO TABS: 1.0000 | ORAL_TABLET | Freq: Three times a day (TID) | ORAL | 0 refills | Status: DC | PRN

## 2022-11-22 MED ORDER — LEVOFLOXACIN 750 MG PO TABS: 750.0000 mg | ORAL_TABLET | Freq: Every day | ORAL | 0 refills | Status: AC

## 2022-11-22 NOTE — Plan of Care (Signed)
  Problem: Health Behavior/Discharge Planning: Goal: Ability to identify and utilize available resources and services will improve Outcome: Progressing   Problem: Metabolic: Goal: Ability to maintain appropriate glucose levels will improve Outcome: Progressing   Problem: Tissue Perfusion: Goal: Adequacy of tissue perfusion will improve Outcome: Progressing   Problem: Clinical Measurements: Goal: Respiratory complications will improve Outcome: Progressing   Problem: Nutrition: Goal: Adequate nutrition will be maintained Outcome: Progressing   Problem: Pain Management: Goal: General experience of comfort will improve Outcome: Progressing   Problem: Safety: Goal: Ability to remain free from injury will improve Outcome: Progressing

## 2022-11-22 NOTE — TOC CM/SW Note (Signed)
Transition of Care [facility]) - Inpatient Brief Assessment   Patient Details  Name: Scott [name] MRN: [id number] Date of Birth: 1960/08/11  Transition of Care [facility]) CM/SW Contact:    Otelia Santee, LCSW Phone Number: 11/22/2022, 9:39 AM   Clinical Narrative: Pt to return home at discharge. Pt on 3L O2 at baseline. No TOC needs identified.   Transition of Care Asessment: Insurance and Status: Insurance coverage has been reviewed Patient has primary care physician: Yes Home environment has been reviewed: Single family home Prior level of function:: Independent Prior/Current Home Services: No current home services Social Determinants of Health Reivew: SDOH reviewed no interventions necessary Readmission risk has been reviewed: Yes Transition of care needs: no transition of care needs at this time

## 2022-11-22 NOTE — Discharge Summary (Signed)
Physician Discharge Summary  Scott [name] YQM:[id number] DOB: 05-07-1960 DOA: 11/19/2022  PCP: Patient, No Pcp Per  Admit date: 11/19/2022 Discharge date: 11/22/2022  Admitted From: Home Disposition: Home  Recommendations for Outpatient Follow-up:  Follow up with PCP in 1-2 weeks Follow-up with pulmonary.   Home Health: N/A Equipment/Devices: Patient does have oxygen at home with him.  Discharge Condition: Stable CODE STATUS: Full code Diet recommendation: Low-salt diet  Discharge summary: 62 year old with history of COPD on chronic oxygen therapy primarily on ambulation, chronic pain syndrome on Percocet, hypertension, chronic diastolic heart failure on treatment, type 2 diabetes on oral agents presented with about 1 week of shortness of breath, fever and change in color of the sputum. In the emergency room hemodynamically stable. Temperature 100. CT scan with diffuse bronchial wall thickening and bilateral peribronchovascular groundglass opacities. Admitted with COPD exacerbation secondary to pneumonia.   Patient treated for COPD with acute exacerbation, he did meet sepsis criteria on admission with tachycardia, tachypnea.  He uses 3 L of oxygen at home occasionally mainly during ambulation. Treated with Levaquin, will complete 3 more days of therapy. Treated with steroids, prednisone 20 mg daily for 3 more days. Patient already is on bronchodilator therapy, on Advair.  He will continue to use incentive spirometer at home.  Much clinical improvement today.  He does ambulate around without symptoms but his oxygen saturation drops less than 90%, he is supposed to wear 3 L of oxygen with mobility but largely asymptomatic.  He has adequate oxygen supplies at home. COVID-19 was negative.  Respiratory virus panel was negative.  Chronic diastolic dysfunction: Euvolemic.  On intermittent Lasix, Entresto.  Continued.  He will resume his Januvia and metformin.  Chronic pain syndrome: Uses  gabapentin, baclofen and Percocet at home.  Currently changing primary care physician and out of his pain medications.  Opiate prescription orders reviewed, recently and has not received any pain medications.  Will prescribe 20 tablets of Percocet 10 mg until he can follow-up with his primary care physician.  Stable for discharge home.  Discharge Diagnoses:  Active Problems:   COPD with acute exacerbation [facility])    Discharge Instructions  Discharge Instructions     Diet - low sodium heart healthy   Complete by: As directed    Diet Carb Modified   Complete by: As directed    Increase activity slowly   Complete by: As directed       Allergies as of 11/22/2022       Reactions   Penicillins Anaphylaxis   Has patient had a PCN reaction causing immediate rash, facial/tongue/throat swelling, SOB or lightheadedness with hypotension: Yes Has patient had a PCN reaction causing severe rash involving mucus membranes or skin necrosis: Yes Has patient had a PCN reaction that required hospitalization: Yes Has patient had a PCN reaction occurring within the last 10 years: No If all of the above answers are "NO", then may proceed with Cephalosporin use. Has patient had a PCN reaction causing immediate rash, facial/tongue/throat swelling, SOB or lightheadedness with hypotension: Yes Has patient had a PCN reaction causing severe rash involving mucus membranes or skin necrosis: Yes Has patient had a PCN reaction that required hospitalization: Yes Has patient had a PCN reaction occurring within the last 10 years: No If all of the above answers are "NO", then may proceed with Cephalosporin use.   Cymbalta [duloxetine Hcl]    "crazy thoughts"        Medication List     STOP  taking these medications    clonazepam 0.125 MG disintegrating tablet Commonly known as: KLONOPIN       TAKE these medications    aspirin EC 81 MG tablet Take 81 mg by mouth daily.   baclofen 10 MG tablet Commonly  known as: LIORESAL Take 10 mg by mouth 2 (two) times daily.   bisoprolol 5 MG tablet Commonly known as: ZEBETA Take 1 tablet (5 mg total) by mouth daily.   fluticasone 50 MCG/ACT nasal spray Commonly known as: FLONASE USE 2 SPRAYS NASALLY EVERY DAY   fluticasone-salmeterol 250-50 MCG/ACT Aepb Commonly known as: ADVAIR Inhale 1 puff into the lungs every 12 (twelve) hours.   furosemide 20 MG tablet Commonly known as: LASIX Take 1 tablet (20 mg total) by mouth every other day. What changed:  how much to take when to take this   gabapentin 300 MG capsule Commonly known as: NEURONTIN TAKE 1 CAPSULE BY MOUTH 3 TIMES DAILY   ipratropium-albuterol 0.5-2.5 (3) MG/3ML Soln Commonly known as: DUONEB Inhale 1 vial via nebulizer every 6 hours as needed   Combivent Respimat 20-100 MCG/ACT Aers respimat Generic drug: Ipratropium-Albuterol INHALE 1 PUFF BY MOUTH FOUR TIMES DAILY   Janumet 50-500 MG tablet Generic drug: sitaGLIPtin-metformin TAKE 1 TABLET BY MOUTH ONCE A DAY   levofloxacin 750 MG tablet Commonly known as: LEVAQUIN Take 1 tablet (750 mg total) by mouth daily for 3 days. Start taking on: November 23, 2022   Narcan 4 MG/0.1ML Liqd nasal spray kit Generic drug: naloxone Place 1 spray into the nose daily as needed (For overdose).   omeprazole 40 MG capsule Commonly known as: PRILOSEC TAKE 1 CAPSULE BY MOUTH EVERY DAY   oxyCODONE-acetaminophen 10-325 MG tablet Commonly known as: PERCOCET Take 1 tablet by mouth every 8 (eight) hours as needed for pain. What changed: when to take this   predniSONE 20 MG tablet Commonly known as: DELTASONE Take 1 tablet (20 mg total) by mouth daily for 3 days. What changed: when to take this   rosuvastatin 10 MG tablet Commonly known as: CRESTOR Take 1 tablet (10 mg total) by mouth daily.   sacubitril-valsartan 97-103 MG Commonly known as: ENTRESTO Take 1 tablet by mouth 2 (two) times daily.   Ventolin HFA 108 (90 Base)  MCG/ACT inhaler Generic drug: albuterol INHALE 1 TO 2 PUFFS BY MOUTH EVERY 6 HOURS AS NEEDED FOR WHEEZING OR SHORTNESS OF BREATH   Vitamin D (Ergocalciferol) 1.25 MG (50000 UNIT) Caps capsule Commonly known as: DRISDOL TAKE ONE CAPSULE BY MOUTH ONCE WEEKLY        Allergies  Allergen Reactions   Penicillins Anaphylaxis    Has patient had a PCN reaction causing immediate rash, facial/tongue/throat swelling, SOB or lightheadedness with hypotension: Yes  Has patient had a PCN reaction causing severe rash involving mucus membranes or skin necrosis: Yes  Has patient had a PCN reaction that required hospitalization: Yes  Has patient had a PCN reaction occurring within the last 10 years: No  If all of the above answers are "NO", then may proceed with Cephalosporin use.  Has patient had a PCN reaction causing immediate rash, facial/tongue/throat swelling, SOB or lightheadedness with hypotension: Yes Has patient had a PCN reaction causing severe rash involving mucus membranes or skin necrosis: Yes Has patient had a PCN reaction that required hospitalization: Yes Has patient had a PCN reaction occurring within the last 10 years: No If all of the above answers are "NO", then may proceed with Cephalosporin use.  Cymbalta [Duloxetine Hcl]     "crazy thoughts"    Consultations: None   Procedures/Studies: CT Angio Chest PE W and/or Wo Contrast  Result Date: 11/20/2022 CLINICAL DATA:  Pulmonary embolism (PE) suspected, high prob. Headache shortness of breath fever POV c/o SOB, fever, and headache x1 weeks. Reports completed abx as OP. Wears 3L 02 at home. Pt has increased WOB. Pausing in between words during triage. EXAM: CT ANGIOGRAPHY CHEST WITH CONTRAST TECHNIQUE: Multidetector CT imaging of the chest was performed using the standard protocol during bolus administration of intravenous contrast. Multiplanar CT image reconstructions and MIPs were obtained to evaluate the vascular anatomy.  RADIATION DOSE REDUCTION: This exam was performed according to the departmental dose-optimization program which includes automated exposure control, adjustment of the mA and/or kV according to patient size and/or use of iterative reconstruction technique. CONTRAST:  75mL OMNIPAQUE IOHEXOL 350 MG/ML SOLN COMPARISON:  CT chest 04/10/2022. FINDINGS: Cardiovascular: Satisfactory opacification of the pulmonary arteries to the segmental level. No evidence of pulmonary embolism. Normal heart size. No significant pericardial effusion. The thoracic aorta is normal in caliber. No atherosclerotic plaque of the thoracic aorta. No coronary artery calcifications. Mediastinum/Nodes: Prominent mediastinal lymph nodes. No enlarged mediastinal, hilar, or axillary lymph nodes. Thyroid gland, trachea, and esophagus demonstrate no significant findings. Lungs/Pleura: Severe paraseptal and central level emphysematous changes. Diffuse mild bronchial wall thickening. Patchy mid to lower lung zone predominant bilateral peribronchovascular ground-glass airspace opacities. No focal consolidation. No pulmonary nodule. No pulmonary mass. No pleural effusion. No pneumothorax. Upper Abdomen: No acute abnormality. Musculoskeletal: No chest wall abnormality. No suspicious lytic or blastic osseous lesions. No acute displaced fracture. C6-C7 anterior cervical discectomy and fusion. Review of the MIP images confirms the above findings. IMPRESSION: 1. No pulmonary embolus. 2. Diffuse bronchial wall thickening with patchy mid to lower lung zone predominant bilateral peribronchovascular ground-glass airspace opacities. Findings suggestive of developing infection/inflammation. Consider repeat CT in 3 months to evaluate for complete resolution. 3. Aortic Atherosclerosis (ICD10-I70.0) and Emphysema (ICD10-J43.9). Electronically Signed   By: Tish Frederickson M.D.   On: 11/20/2022 00:39   DG Chest Port 1 View  Result Date: 11/19/2022 CLINICAL DATA:  Short  of breath, fever, headache for 1 week EXAM: PORTABLE CHEST 1 VIEW COMPARISON:  04/22/2022 FINDINGS: Two frontal views of the chest demonstrate an unremarkable cardiac silhouette. Lungs are hyperinflated, without acute airspace disease, effusion, or pneumothorax. No acute bony abnormalities. IMPRESSION: 1. Stable hyperinflation consistent with emphysema. No acute airspace disease. Electronically Signed   By: Sharlet Salina M.D.   On: 11/19/2022 21:43   (Echo, Carotid, EGD, Colonoscopy, ERCP)    Subjective: Patient seen and examined.  Denies any complaints.  Wife at the bedside.  He walked around the hallway without obvious shortness of breath but his oxygen saturation was 81% and quickly recovered.  He has oxygen with him but prefers not to use it.  Cough and wheezing has improved.   Discharge Exam: Vitals:   11/22/22 0801 11/22/22 0830  BP:    Pulse: 75   Resp: 20   Temp:    SpO2: 90% 90%   Vitals:   11/22/22 0500 11/22/22 0656 11/22/22 0801 11/22/22 0830  BP:      Pulse:   75   Resp:   20   Temp:      TempSrc:      SpO2:   90% 90%  Weight: 80.9 kg 80.2 kg    Height:        General: Pt is  alert, awake, not in acute distress, able to talk in complete sentences. Cardiovascular: RRR, S1/S2 +, no rubs, no gallops Respiratory: CTA bilaterally, no wheezing, no rhonchi, no added sounds. Abdominal: Soft, NT, ND, bowel sounds + Extremities: no edema, no cyanosis    The results of significant diagnostics from this hospitalization (including imaging, microbiology, ancillary and laboratory) are listed below for reference.     Microbiology: Recent Results (from the past 240 hour(s))  SARS Coronavirus 2 by RT PCR (hospital order, performed in [facility] hospital lab) *cepheid single result test* Anterior Nasal Swab     Status: None   Collection Time: 11/19/22  9:05 PM   Specimen: Anterior Nasal Swab  Result Value Ref Range Status   SARS Coronavirus 2 by RT PCR NEGATIVE NEGATIVE Final     Comment: (NOTE) SARS-CoV-2 target nucleic acids are NOT DETECTED.  The SARS-CoV-2 RNA is generally detectable in upper and lower respiratory specimens during the acute phase of infection. The lowest concentration of SARS-CoV-2 viral copies this assay can detect is 250 copies / mL. A negative result does not preclude SARS-CoV-2 infection and should not be used as the sole basis for treatment or other patient management decisions.  A negative result may occur with improper specimen collection / handling, submission of specimen other than nasopharyngeal swab, presence of viral mutation(s) within the areas targeted by this assay, and inadequate number of viral copies (<250 copies / mL). A negative result must be combined with clinical observations, patient history, and epidemiological information.  Fact Sheet for Patients:   RoadLapTop.co.za  Fact Sheet for Healthcare Providers: http://kim-miller.com/  This test is not yet approved or  cleared by the [place] FDA and has been authorized for detection and/or diagnosis of SARS-CoV-2 by FDA under an Emergency Use Authorization (EUA).  This EUA will remain in effect (meaning this test can be used) for the duration of the COVID-19 declaration under Section 564(b)(1) of the Act, 21 U.S.C. section 360bbb-3(b)(1), unless the authorization is terminated or revoked sooner.  Performed at [facility], [address], [place], [place] [address]   Respiratory (~20 pathogens) panel by PCR     Status: None   Collection Time: 11/19/22  9:07 PM   Specimen: Nasopharyngeal Swab; Respiratory  Result Value Ref Range Status   Adenovirus NOT DETECTED NOT DETECTED Final   Coronavirus 229E NOT DETECTED NOT DETECTED Final    Comment: (NOTE) The Coronavirus on the Respiratory Panel, DOES NOT test for the novel  Coronavirus (2019 nCoV)    Coronavirus HKU1 NOT DETECTED NOT DETECTED  Final   Coronavirus NL63 NOT DETECTED NOT DETECTED Final   Coronavirus OC43 NOT DETECTED NOT DETECTED Final   Metapneumovirus NOT DETECTED NOT DETECTED Final   Rhinovirus / Enterovirus NOT DETECTED NOT DETECTED Final   Influenza A NOT DETECTED NOT DETECTED Final   Influenza B NOT DETECTED NOT DETECTED Final   Parainfluenza Virus 1 NOT DETECTED NOT DETECTED Final   Parainfluenza Virus 2 NOT DETECTED NOT DETECTED Final   Parainfluenza Virus 3 NOT DETECTED NOT DETECTED Final   Parainfluenza Virus 4 NOT DETECTED NOT DETECTED Final   Respiratory Syncytial Virus NOT DETECTED NOT DETECTED Final   Bordetella pertussis NOT DETECTED NOT DETECTED Final   Bordetella Parapertussis NOT DETECTED NOT DETECTED Final   Chlamydophila pneumoniae NOT DETECTED NOT DETECTED Final   Mycoplasma pneumoniae NOT DETECTED NOT DETECTED Final    Comment: Performed at [facility], [address], [place], [place]  [address]  Blood culture (routine x 2)     Status: None (Preliminary result)   Collection Time: 11/19/22 10:19 PM   Specimen: BLOOD RIGHT ARM  Result Value Ref Range Status   Specimen Description   Final    BLOOD RIGHT ARM Performed at [facility], [address] [place], [place] [address]    Special Requests   Final    Blood Culture adequate volume BOTTLES DRAWN AEROBIC AND ANAEROBIC Performed at [facility], [address] [place], [place] [address]    Culture   Final    NO GROWTH 2 DAYS Performed at [facility], [address], [place], [place] [address]    Report Status PENDING  Incomplete  Blood culture (routine x 2)     Status: None (Preliminary result)   Collection Time: 11/19/22 10:19 PM   Specimen: BLOOD  Result Value Ref Range Status   Specimen Description   Final    BLOOD LEFT ANTECUBITAL Performed at [facility], [address] [place], [place] [address]    Special Requests   Final    Blood Culture adequate volume BOTTLES DRAWN  AEROBIC AND ANAEROBIC Performed at [facility], [address] [place], [place] [address]    Culture   Final    NO GROWTH 2 DAYS Performed at [facility], [address], [place], [place] [address]    Report Status PENDING  Incomplete     Labs: BNP (last 3 results) Recent Labs    11/19/22 2102  BNP 116.3*   Basic Metabolic Panel: Recent Labs  Lab 11/19/22 2102 11/20/22 0913 11/21/22 0523  NA 140  --  141  K 3.4*  --  3.0*  CL 101  --  102  CO2 27  --  27  GLUCOSE 107*  --  220*  BUN 13  --  25*  CREATININE 0.98 0.84 0.97  CALCIUM 9.0  --  8.9   Liver Function Tests: Recent Labs  Lab 11/19/22 2102 11/21/22 0523  AST 24 29  ALT 17 21  ALKPHOS 84 69  BILITOT 1.6* 0.6  PROT 7.7 6.8  ALBUMIN 3.7 3.1*   No results for input(s): "LIPASE", "AMYLASE" in the last 168 hours. No results for input(s): "AMMONIA" in the last 168 hours. CBC: Recent Labs  Lab 11/19/22 2102 11/20/22 0913 11/21/22 0523  WBC 12.1* 9.5 14.6*  NEUTROABS 9.5*  --   --   HGB 13.1 13.1 12.0*  HCT 40.5 40.8 37.9*  MCV 87.9 88.9 88.8  PLT 226 246 263   Cardiac Enzymes: No results for input(s): "CKTOTAL", "CKMB", "CKMBINDEX", "TROPONINI" in the last 168 hours. BNP: Invalid input(s): "POCBNP" CBG: Recent Labs  Lab 11/21/22 0746 11/21/22 1148 11/21/22 1640 11/21/22 2121 11/22/22 0751  GLUCAP 183* 182* 177* 171* 135*   D-Dimer No results for input(s): "DDIMER" in the last 72 hours. Hgb A1c Recent Labs    11/20/22 0831  HGBA1C 6.3*   Lipid Profile No results for input(s): "CHOL", "HDL", "LDLCALC", "TRIG", "CHOLHDL", "LDLDIRECT" in the last 72 hours. Thyroid function studies No results for input(s): "TSH", "T4TOTAL", "T3FREE", "THYROIDAB" in the last 72 hours.  Invalid input(s): "FREET3" Anemia work up No results for input(s): "VITAMINB12", "FOLATE", "FERRITIN", "TIBC", "IRON", "RETICCTPCT" in the last 72 hours. Urinalysis    Component Value  Date/Time   COLORURINE YELLOW 07/10/2018 1340   APPEARANCEUR CLEAR 07/10/2018 1340   LABSPEC 1.021 07/10/2018 1340  PHURINE 5.0 07/10/2018 1340   GLUCOSEU NEGATIVE 07/10/2018 1340   HGBUR NEGATIVE 07/10/2018 1340   BILIRUBINUR NEGATIVE 07/10/2018 1340   KETONESUR NEGATIVE 07/10/2018 1340   PROTEINUR NEGATIVE 07/10/2018 1340   NITRITE NEGATIVE 07/10/2018 1340   LEUKOCYTESUR NEGATIVE 07/10/2018 1340   Sepsis Labs Recent Labs  Lab 11/19/22 2102 11/20/22 0913 11/21/22 0523  WBC 12.1* 9.5 14.6*   Microbiology Recent Results (from the past 240 hour(s))  SARS Coronavirus 2 by RT PCR (hospital order, performed in [facility] hospital lab) *cepheid single result test* Anterior Nasal Swab     Status: None   Collection Time: 11/19/22  9:05 PM   Specimen: Anterior Nasal Swab  Result Value Ref Range Status   SARS Coronavirus 2 by RT PCR NEGATIVE NEGATIVE Final    Comment: (NOTE) SARS-CoV-2 target nucleic acids are NOT DETECTED.  The SARS-CoV-2 RNA is generally detectable in upper and lower respiratory specimens during the acute phase of infection. The lowest concentration of SARS-CoV-2 viral copies this assay can detect is 250 copies / mL. A negative result does not preclude SARS-CoV-2 infection and should not be used as the sole basis for treatment or other patient management decisions.  A negative result may occur with improper specimen collection / handling, submission of specimen other than nasopharyngeal swab, presence of viral mutation(s) within the areas targeted by this assay, and inadequate number of viral copies (<250 copies / mL). A negative result must be combined with clinical observations, patient history, and epidemiological information.  Fact Sheet for Patients:   RoadLapTop.co.za  Fact Sheet for Healthcare Providers: http://kim-miller.com/  This test is not yet approved or  cleared by the [place] FDA and has been  authorized for detection and/or diagnosis of SARS-CoV-2 by FDA under an Emergency Use Authorization (EUA).  This EUA will remain in effect (meaning this test can be used) for the duration of the COVID-19 declaration under Section 564(b)(1) of the Act, 21 U.S.C. section 360bbb-3(b)(1), unless the authorization is terminated or revoked sooner.  Performed at [facility], [address], [place], [place] [address]   Respiratory (~20 pathogens) panel by PCR     Status: None   Collection Time: 11/19/22  9:07 PM   Specimen: Nasopharyngeal Swab; Respiratory  Result Value Ref Range Status   Adenovirus NOT DETECTED NOT DETECTED Final   Coronavirus 229E NOT DETECTED NOT DETECTED Final    Comment: (NOTE) The Coronavirus on the Respiratory Panel, DOES NOT test for the novel  Coronavirus (2019 nCoV)    Coronavirus HKU1 NOT DETECTED NOT DETECTED Final   Coronavirus NL63 NOT DETECTED NOT DETECTED Final   Coronavirus OC43 NOT DETECTED NOT DETECTED Final   Metapneumovirus NOT DETECTED NOT DETECTED Final   Rhinovirus / Enterovirus NOT DETECTED NOT DETECTED Final   Influenza A NOT DETECTED NOT DETECTED Final   Influenza B NOT DETECTED NOT DETECTED Final   Parainfluenza Virus 1 NOT DETECTED NOT DETECTED Final   Parainfluenza Virus 2 NOT DETECTED NOT DETECTED Final   Parainfluenza Virus 3 NOT DETECTED NOT DETECTED Final   Parainfluenza Virus 4 NOT DETECTED NOT DETECTED Final   Respiratory Syncytial Virus NOT DETECTED NOT DETECTED Final   Bordetella pertussis NOT DETECTED NOT DETECTED Final   Bordetella Parapertussis NOT DETECTED NOT DETECTED Final   Chlamydophila pneumoniae NOT DETECTED NOT DETECTED Final   Mycoplasma pneumoniae NOT DETECTED NOT DETECTED Final    Comment: Performed at [facility], [address] [place], [place] [address]  Blood culture (routine x 2)     Status: None (Preliminary result)   Collection Time: 11/19/22 10:19 PM   Specimen: BLOOD RIGHT ARM   Result Value Ref Range Status   Specimen Description   Final    BLOOD RIGHT ARM Performed at [facility], [address] [place], [place] [address]    Special Requests   Final    Blood Culture adequate volume BOTTLES DRAWN AEROBIC AND ANAEROBIC Performed at [facility], [address], [place], [place] [address]    Culture   Final    NO GROWTH 2 DAYS Performed at [facility], [address], [place], [place] [address]    Report Status PENDING  Incomplete  Blood culture (routine x 2)     Status: None (Preliminary result)   Collection Time: 11/19/22 10:19 PM   Specimen: BLOOD  Result Value Ref Range Status   Specimen Description   Final    BLOOD LEFT ANTECUBITAL Performed at [facility], [address] [place], [place] [address]    Special Requests   Final    Blood Culture adequate volume BOTTLES DRAWN AEROBIC AND ANAEROBIC Performed at [facility], [address] [place], [place] [address]    Culture   Final    NO GROWTH 2 DAYS Performed at [facility], [address], [place], [place] [address]    Report Status PENDING  Incomplete     Time coordinating discharge: 35 minutes  SIGNED:   Dorcas Carrow, MD  [facility] 11/22/2022, 9:20 AM

## 2022-11-22 NOTE — Progress Notes (Addendum)
Contacted Dr. Jerral Ralph this AM: 1. This patient's potassium was 3.0, would you like to replace with PO potassium? 2. Once that is corrected, do you think this patient will be discharged home today?   Potassium level was from 10/29, already replaced PO. Per provider do not need to recheck. Have pt OOB walking in hall with O2 before discharge.

## 2022-11-25 LAB — CULTURE, BLOOD (ROUTINE X 2)
Culture: NO GROWTH
Culture: NO GROWTH
Special Requests: ADEQUATE
Special Requests: ADEQUATE

## 2022-12-15 NOTE — Progress Notes (Unsigned)
  Cardiology Office Note:   Date:  12/18/2022  ID:  Scott [name], DOB 06-26-1960, MRN [id number] PCP: Patient, No Pcp Per  [facility] Providers Cardiologist:  Rollene Rotunda, MD {  History of Present Illness:   Scott [name] is a 62 y.o. male  who presents for chronic systolic heart failure, nonischemic cardiomyopathy He has no angiographic evidence of CAD.with [name] heart cath revealing volume overload.  EF was 30% to 35%.   He was hospitalized in early June 2021 due to respiratory failure, COPD exacerbation, and hypotension.  At that time Entresto, Lasix, and bisoprolol were discontinued.  We have been titrating these meds back up at recent visits.  He uses O2 PRN.  His last ejection fraction in 2022 was 60 to 65%.    Since he was last seen he was in the hospital with COPD flare.  I reviewed this.  He has chronic diastolic dysfunction but was thought to be euvolemic.  He has chronic pain and is being referred for pain management.  The patient denies any new symptoms such as chest discomfort, neck or arm discomfort. There has been no new shortness of breath, PND or orthopnea. There have been no reported palpitations, presyncope or syncope.  Opiate prescription orders reviewed, recently and has not received any pain medications.  Will prescribe 20 tablets of Percocet 10 mg until he can follow-up with his primary care physician.   ROS:    Positive for ED.   Otherwise as stated in the HPI and negative for all other systems.  Studies Reviewed:    EKG:   EKG Interpretation Date/Time:  Monday December 18 2022 09:46:51 EST Ventricular Rate:  75 PR Interval:  236 QRS Duration:  100 QT Interval:  396 QTC Calculation: 442 R Axis:   44  Text Interpretation: Sinus rhythm with 1st degree A-V block When compared with ECG of 19-Nov-2022 20:54, rate is slower Confirmed by Rollene Rotunda ([id number]) on 12/18/2022 10:18:11 AM    Risk Assessment/Calculations:           Physical Exam:    VS:  BP 90/60 (BP Location: Left Arm, Patient Position: Sitting, Cuff Size: Large)   Pulse 75   Ht 5\' 8"  (1.727 m)   Wt 186 lb (84.4 kg)   SpO2 92%   BMI 28.28 kg/m    Wt Readings from Last 3 Encounters:  12/18/22 186 lb (84.4 kg)  11/22/22 176 lb 12.8 oz (80.2 kg)  08/21/22 189 lb 3.2 oz (85.8 kg)     GEN: Well nourished, well developed in no acute distress NECK: No JVD; No carotid bruits CARDIAC: RRR, no murmurs, rubs, gallops RESPIRATORY:  Clear to auscultation without rales, wheezing or rhonchi  ABDOMEN: Soft, non-tender, non-distended EXTREMITIES:  No edema; No deformity   ASSESSMENT AND PLAN:   Nonischemic cardiomyopathy: His ejection fraction improved.  He tolerates the meds as listed although he had a question about his Lasix as the pharmacy keeps giving him 20 mg tablets and I think he could take as little as 10 mg every other day and an extra 10 as needed.  Hypertension:    the BP is low.  No change in therapy.    COPD:    He uses PRN O2.   ED:  I will give him a prescription for Viagra PRN.          Follow up with me in one year.   Signed, Rollene Rotunda, MD

## 2022-12-18 ENCOUNTER — Encounter: Payer: Self-pay | Admitting: Cardiology

## 2022-12-18 ENCOUNTER — Ambulatory Visit: Payer: Medicare HMO | Attending: Cardiology | Admitting: Cardiology

## 2022-12-18 VITALS — BP 90/60 | HR 75 | Ht 68.0 in | Wt 186.0 lb

## 2022-12-18 DIAGNOSIS — I428 Other cardiomyopathies: Secondary | ICD-10-CM

## 2022-12-18 DIAGNOSIS — I10 Essential (primary) hypertension: Secondary | ICD-10-CM | POA: Diagnosis not present

## 2022-12-18 DIAGNOSIS — Z720 Tobacco use: Secondary | ICD-10-CM | POA: Diagnosis not present

## 2022-12-18 MED ORDER — SILDENAFIL CITRATE 50 MG PO TABS: 50.0000 mg | ORAL_TABLET | Freq: Every day | ORAL | 3 refills | Status: DC | PRN

## 2022-12-18 MED ORDER — FUROSEMIDE 20 MG PO TABS: 20.0000 mg | ORAL_TABLET | ORAL | 3 refills | Status: DC

## 2022-12-18 NOTE — Patient Instructions (Signed)
Medication Instructions:  Start taking one half of furosemide 20 mg tablet every other day. Viagra 50 mg tablets as needed. New scripts sent.  *If you need a refill on your cardiac medications before your next appointment, please call your pharmacy*    Follow-Up: At [facility], you and your health needs are our priority.  As part of our continuing mission to provide you with exceptional heart care, we have created designated Provider Care Teams.  These Care Teams include your primary Cardiologist (physician) and Advanced Practice Providers (APPs -  Physician Assistants and Nurse Practitioners) who all work together to provide you with the care you need, when you need it.   Your next appointment:   12 month(s)  Provider:   Rollene Rotunda, MD

## 2022-12-27 ENCOUNTER — Ambulatory Visit: Payer: Medicare HMO | Admitting: Emergency Medicine

## 2022-12-27 ENCOUNTER — Encounter: Payer: Self-pay | Admitting: Emergency Medicine

## 2022-12-27 VITALS — BP 120/68 | HR 80 | Temp 98.7°F | Ht 68.0 in | Wt 186.6 lb

## 2022-12-27 DIAGNOSIS — B370 Candidal stomatitis: Secondary | ICD-10-CM | POA: Diagnosis not present

## 2022-12-27 DIAGNOSIS — F17200 Nicotine dependence, unspecified, uncomplicated: Secondary | ICD-10-CM

## 2022-12-27 DIAGNOSIS — J449 Chronic obstructive pulmonary disease, unspecified: Secondary | ICD-10-CM | POA: Diagnosis not present

## 2022-12-27 DIAGNOSIS — G894 Chronic pain syndrome: Secondary | ICD-10-CM

## 2022-12-27 MED ORDER — VENTOLIN HFA 108 (90 BASE) MCG/ACT IN AERS: INHALATION_SPRAY | RESPIRATORY_TRACT | 1 refills | Status: DC

## 2022-12-27 MED ORDER — COMBIVENT RESPIMAT 20-100 MCG/ACT IN AERS: INHALATION_SPRAY | RESPIRATORY_TRACT | 3 refills | Status: DC

## 2022-12-27 MED ORDER — IPRATROPIUM-ALBUTEROL 0.5-2.5 (3) MG/3ML IN SOLN: RESPIRATORY_TRACT | 3 refills | Status: DC

## 2022-12-27 MED ORDER — NYSTATIN 100000 UNIT/ML MT SUSP: 5.0000 mL | Freq: Four times a day (QID) | OROMUCOSAL | 0 refills | Status: DC

## 2022-12-27 MED ORDER — FLUCONAZOLE 100 MG PO TABS: 100.0000 mg | ORAL_TABLET | Freq: Every day | ORAL | 0 refills | Status: DC

## 2022-12-27 NOTE — Assessment & Plan Note (Signed)
Chronic Pain Reports pain in legs and back. Currently seeking pain management. -Encourage follow-up with [facility] as referred by primary care provide

## 2022-12-27 NOTE — Assessment & Plan Note (Signed)
Oral Thrush Likely secondary to Advair use. Patient reports recurrent episodes since starting Advair. -Prescribe Nystatin mouthwash and fluconazole -Consider discontinuing Advair if thrush persists despite treatment.

## 2022-12-27 NOTE — Progress Notes (Signed)
  Subjective:    Patient ID: Scott [name], male    DOB: 05/17/1960, 62 y.o.   MRN: [id number]  HPI  ROV 12/27/2022 -- The patient, a 62 year old with a history of tobacco use and severe COPD with associated chronic hypoxemic respiratory failure, presents with persistent cough and mucus production. He has been managed on Combivent and Duoneb as needed, with inconsistent adherence to scheduled bronchodilator therapy. Despite a prescription for oxygen at three liters per minute, compliance has been marginal. He was recently hospitalized for an acute exacerbation of COPD and discharged on 11/22/22.  The patient also uses DuoNeb as needed and was restarted on Advair 250/50 in July. He reports a recurrent thrush problem since starting Advair, which he believes has been beneficial for his breathing in conjunction with Combivent. He has been attempting to breathe without supplemental oxygen, using it only when he feels particularly unwell.  The patient also reports pain in his leg, back, and lungs, which he describes as "hurting bad, tight." He has been referred to [facility] for pain management, but has not yet been seen.  The patient quit smoking on 05/17/22 and has not resumed. He is due for lung cancer screening, CT chest in March 2025.        No data to display          Objective:   Physical Exam  Vitals:   12/27/22 1302  BP: 120/68  Pulse: 80  Temp: 98.7 F (37.1 C)  TempSrc: Oral  SpO2: (!) 88%  Weight: 186 lb 9.6 oz (84.6 kg)  Height: 5\' 8"  (1.727 m)    Gen: Pleasant, well-nourished, in no distress  ENT: No lesions,  mouth clear,  oropharynx clear, no postnasal drip,   Neck: No JVD, no stridor  Lungs: mostly clear, overall clear, few end expiratory wheezes on forced expiration  Cardiovascular: RRR, heart sounds normal, no murmur or gallops, trace peripheral edema  Musculoskeletal: No deformities, no cyanosis or clubbing  Neuro: alert, appropriate,  non-focal  Skin: Warm     Assessment & Plan:  COPD (chronic obstructive pulmonary disease) Severe COPD with chronic hypoxemic respiratory failure Persistent symptoms of cough and mucus production. Inconsistent use of scheduled bronchodilator therapy and marginal compliance with prescribed oxygen therapy. Recent hospitalization for acute exacerbation. Currently on Combivent, Duoneb, and Advair 250/50. -Continue Combivent, Duoneb, and Advair 250/50. -Encourage consistent use of bronchodilator therapy and oxygen. -Consider discontinuing Advair if thrush persists despite treatment.  Thrush Oral Thrush Likely secondary to Advair use. Patient reports recurrent episodes since starting Advair. -Prescribe Nystatin mouthwash and fluconazole -Consider discontinuing Advair if thrush persists despite treatment.  Tobacco use disorder Tobacco Use Quit smoking in April 2024. -Continue to encourage smoking cessation. -Lung cancer screening CT chest in March 2025  Chronic pain syndrome Chronic Pain Reports pain in legs and back. Currently seeking pain management. -Encourage follow-up with [facility] as referred by primary care provide     Levy Pupa, MD, PhD 12/27/2022, 5:03 PM [facility] [phone] or if no answer [phone]

## 2022-12-27 NOTE — Assessment & Plan Note (Addendum)
Tobacco Use Quit smoking in April 2024. -Continue to encourage smoking cessation. -Lung cancer screening CT chest in March 2025

## 2022-12-27 NOTE — Patient Instructions (Signed)
VISIT SUMMARY:  Today, we discussed your ongoing issues with COPD, including your persistent cough and mucus production, as well as your inconsistent use of bronchodilator therapy and oxygen. We also addressed your recurrent oral thrush, chronic pain, and smoking cessation progress. Additionally, we reviewed your upcoming lung cancer screening.  YOUR PLAN:  -SEVERE COPD WITH CHRONIC HYPOXEMIC RESPIRATORY FAILURE: Chronic Obstructive Pulmonary Disease (COPD) is a long-term lung condition that makes it hard to breathe. You have been experiencing persistent cough and mucus production. Please continue using Combivent, Duoneb, and Advair 250/50 as prescribed. It is important to use your bronchodilator therapy and oxygen consistently. If the thrush continues, we may need to stop Advair.  -ORAL THRUSH: Oral thrush is a fungal infection in the mouth, likely caused by your use of Advair. We have prescribed Nystatin mouthwash and an oral antifungal medication. If the thrush does not improve, we may need to discontinue Advair.  -TOBACCO USE: You have successfully quit smoking since April 2024. This is a significant achievement for your lung health. Please continue to avoid smoking.  -CHRONIC PAIN: You have reported pain in your legs and back. It is important to follow up with [facility] for further management as referred by your primary care provider.  -GENERAL HEALTH MAINTENANCE: We have scheduled your lung cancer screening CT chest for March 2025. This is an important step in monitoring your lung health.  INSTRUCTIONS:  Please ensure you are using your bronchodilator therapy and oxygen as prescribed. Follow up with [facility] for your chronic pain management. Your lung cancer screening CT chest is scheduled for March 2025.

## 2022-12-27 NOTE — Assessment & Plan Note (Signed)
Severe COPD with chronic hypoxemic respiratory failure Persistent symptoms of cough and mucus production. Inconsistent use of scheduled bronchodilator therapy and marginal compliance with prescribed oxygen therapy. Recent hospitalization for acute exacerbation. Currently on Combivent, Duoneb, and Advair 250/50. -Continue Combivent, Duoneb, and Advair 250/50. -Encourage consistent use of bronchodilator therapy and oxygen. -Consider discontinuing Advair if thrush persists despite treatment.

## 2023-01-23 ENCOUNTER — Inpatient Hospital Stay (HOSPITAL_COMMUNITY)
Admission: EM | Admit: 2023-01-23 | Discharge: 2023-01-26 | DRG: 189 | Disposition: A | Discharge status: Home / Self Care | Payer: Medicare HMO | Attending: Internal Medicine | Admitting: Internal Medicine

## 2023-01-23 ENCOUNTER — Encounter (HOSPITAL_COMMUNITY): Payer: Self-pay

## 2023-01-23 ENCOUNTER — Other Ambulatory Visit: Payer: Self-pay

## 2023-01-23 ENCOUNTER — Emergency Department (HOSPITAL_COMMUNITY): Payer: Medicare HMO

## 2023-01-23 DIAGNOSIS — E1165 Type 2 diabetes mellitus with hyperglycemia: Secondary | ICD-10-CM | POA: Diagnosis present

## 2023-01-23 DIAGNOSIS — Z825 Family history of asthma and other chronic lower respiratory diseases: Secondary | ICD-10-CM

## 2023-01-23 DIAGNOSIS — I428 Other cardiomyopathies: Secondary | ICD-10-CM

## 2023-01-23 DIAGNOSIS — R6510 Systemic inflammatory response syndrome (SIRS) of non-infectious origin without acute organ dysfunction: Secondary | ICD-10-CM | POA: Diagnosis present

## 2023-01-23 DIAGNOSIS — J9601 Acute respiratory failure with hypoxia: Secondary | ICD-10-CM | POA: Diagnosis present

## 2023-01-23 DIAGNOSIS — E1140 Type 2 diabetes mellitus with diabetic neuropathy, unspecified: Secondary | ICD-10-CM | POA: Diagnosis present

## 2023-01-23 DIAGNOSIS — J189 Pneumonia, unspecified organism: Principal | ICD-10-CM | POA: Diagnosis present

## 2023-01-23 DIAGNOSIS — Z880 Allergy status to penicillin: Secondary | ICD-10-CM

## 2023-01-23 DIAGNOSIS — G894 Chronic pain syndrome: Secondary | ICD-10-CM | POA: Diagnosis present

## 2023-01-23 DIAGNOSIS — Z8249 Family history of ischemic heart disease and other diseases of the circulatory system: Secondary | ICD-10-CM

## 2023-01-23 DIAGNOSIS — Z7982 Long term (current) use of aspirin: Secondary | ICD-10-CM

## 2023-01-23 DIAGNOSIS — Z91199 Patient's noncompliance with other medical treatment and regimen due to unspecified reason: Secondary | ICD-10-CM

## 2023-01-23 DIAGNOSIS — Z79899 Other long term (current) drug therapy: Secondary | ICD-10-CM

## 2023-01-23 DIAGNOSIS — I252 Old myocardial infarction: Secondary | ICD-10-CM

## 2023-01-23 DIAGNOSIS — J9621 Acute and chronic respiratory failure with hypoxia: Secondary | ICD-10-CM | POA: Diagnosis not present

## 2023-01-23 DIAGNOSIS — Z7951 Long term (current) use of inhaled steroids: Secondary | ICD-10-CM

## 2023-01-23 DIAGNOSIS — Z7984 Long term (current) use of oral hypoglycemic drugs: Secondary | ICD-10-CM

## 2023-01-23 DIAGNOSIS — Z83438 Family history of other disorder of lipoprotein metabolism and other lipidemia: Secondary | ICD-10-CM

## 2023-01-23 DIAGNOSIS — Z888 Allergy status to other drugs, medicaments and biological substances status: Secondary | ICD-10-CM

## 2023-01-23 DIAGNOSIS — Z818 Family history of other mental and behavioral disorders: Secondary | ICD-10-CM

## 2023-01-23 DIAGNOSIS — E119 Type 2 diabetes mellitus without complications: Secondary | ICD-10-CM

## 2023-01-23 DIAGNOSIS — I5042 Chronic combined systolic (congestive) and diastolic (congestive) heart failure: Secondary | ICD-10-CM | POA: Diagnosis present

## 2023-01-23 DIAGNOSIS — Z981 Arthrodesis status: Secondary | ICD-10-CM

## 2023-01-23 DIAGNOSIS — Z6831 Body mass index (BMI) 31.0-31.9, adult: Secondary | ICD-10-CM

## 2023-01-23 DIAGNOSIS — Z1152 Encounter for screening for COVID-19: Secondary | ICD-10-CM

## 2023-01-23 DIAGNOSIS — K219 Gastro-esophageal reflux disease without esophagitis: Secondary | ICD-10-CM | POA: Diagnosis present

## 2023-01-23 DIAGNOSIS — Z87891 Personal history of nicotine dependence: Secondary | ICD-10-CM

## 2023-01-23 DIAGNOSIS — J441 Chronic obstructive pulmonary disease with (acute) exacerbation: Secondary | ICD-10-CM | POA: Diagnosis present

## 2023-01-23 DIAGNOSIS — E669 Obesity, unspecified: Secondary | ICD-10-CM | POA: Diagnosis present

## 2023-01-23 DIAGNOSIS — Z833 Family history of diabetes mellitus: Secondary | ICD-10-CM

## 2023-01-23 DIAGNOSIS — I959 Hypotension, unspecified: Secondary | ICD-10-CM | POA: Diagnosis present

## 2023-01-23 LAB — CBC WITH DIFFERENTIAL/PLATELET
Abs Immature Granulocytes: 0.02 10*3/uL (ref 0.00–0.07)
Basophils Absolute: 0 10*3/uL (ref 0.0–0.1)
Basophils Relative: 0 %
Eosinophils Absolute: 0 10*3/uL (ref 0.0–0.5)
Eosinophils Relative: 0 %
HCT: 39.7 % (ref 39.0–52.0)
Hemoglobin: 12.7 g/dL — ABNORMAL LOW (ref 13.0–17.0)
Immature Granulocytes: 0 %
Lymphocytes Relative: 13 %
Lymphs Abs: 0.9 10*3/uL (ref 0.7–4.0)
MCH: 28.3 pg (ref 26.0–34.0)
MCHC: 32 g/dL (ref 30.0–36.0)
MCV: 88.4 fL (ref 80.0–100.0)
Monocytes Absolute: 0.9 10*3/uL (ref 0.1–1.0)
Monocytes Relative: 13 %
Neutro Abs: 5.3 10*3/uL (ref 1.7–7.7)
Neutrophils Relative %: 74 %
Platelets: 180 10*3/uL (ref 150–400)
RBC: 4.49 MIL/uL (ref 4.22–5.81)
RDW: 14.6 % (ref 11.5–15.5)
WBC: 7.1 10*3/uL (ref 4.0–10.5)
nRBC: 0 % (ref 0.0–0.2)

## 2023-01-23 LAB — COMPREHENSIVE METABOLIC PANEL
ALT: 9 U/L (ref 0–44)
AST: 14 U/L — ABNORMAL LOW (ref 15–41)
Albumin: 3.9 g/dL (ref 3.5–5.0)
Alkaline Phosphatase: 63 U/L (ref 38–126)
Anion gap: 11 (ref 5–15)
BUN: 12 mg/dL (ref 8–23)
CO2: 21 mmol/L — ABNORMAL LOW (ref 22–32)
Calcium: 8.3 mg/dL — ABNORMAL LOW (ref 8.9–10.3)
Chloride: 101 mmol/L (ref 98–111)
Creatinine, Ser: 0.85 mg/dL (ref 0.61–1.24)
GFR, Estimated: >60 mL/min (ref >60)
Glucose, Bld: 119 mg/dL — ABNORMAL HIGH (ref 70–99)
Potassium: 3.3 mmol/L — ABNORMAL LOW (ref 3.5–5.1)
Sodium: 133 mmol/L — ABNORMAL LOW (ref 135–145)
Total Bilirubin: 1.6 mg/dL — ABNORMAL HIGH (ref 0.0–1.2)
Total Protein: 7.4 g/dL (ref 6.5–8.1)

## 2023-01-23 LAB — URINALYSIS, W/ REFLEX TO CULTURE (INFECTION SUSPECTED)
Bacteria, UA: NONE SEEN
Bilirubin Urine: NEGATIVE
Glucose, UA: NEGATIVE mg/dL
Hgb urine dipstick: NEGATIVE
Ketones, ur: NEGATIVE mg/dL
Leukocytes,Ua: NEGATIVE
Nitrite: NEGATIVE
Protein, ur: 100 mg/dL — AB
Specific Gravity, Urine: 1.03 (ref 1.005–1.030)
pH: 5 (ref 5.0–8.0)

## 2023-01-23 LAB — APTT: aPTT: 35 s (ref 24–36)

## 2023-01-23 LAB — I-STAT CG4 LACTIC ACID, ED: Lactic Acid, Venous: 0.9 mmol/L (ref 0.5–1.9)

## 2023-01-23 LAB — RESP PANEL BY RT-PCR (RSV, FLU A&B, COVID)  RVPGX2
Influenza A by PCR: NEGATIVE
Influenza B by PCR: NEGATIVE
Resp Syncytial Virus by PCR: NEGATIVE
SARS Coronavirus 2 by RT PCR: NEGATIVE

## 2023-01-23 LAB — EXPECTORATED SPUTUM ASSESSMENT W GRAM STAIN, RFLX TO RESP C

## 2023-01-23 LAB — CBG MONITORING, ED
Glucose-Capillary: 140 mg/dL — ABNORMAL HIGH (ref 70–99)
Glucose-Capillary: 141 mg/dL — ABNORMAL HIGH (ref 70–99)
Glucose-Capillary: 159 mg/dL — ABNORMAL HIGH (ref 70–99)

## 2023-01-23 LAB — PROTIME-INR
INR: 1.2 (ref 0.8–1.2)
Prothrombin Time: 15.1 s (ref 11.4–15.2)

## 2023-01-23 LAB — BRAIN NATRIURETIC PEPTIDE: B Natriuretic Peptide: 130.3 pg/mL — ABNORMAL HIGH (ref 0.0–100.0)

## 2023-01-23 MED ORDER — METRONIDAZOLE 500 MG/100ML IV SOLN
500.0000 mg | Freq: Once | INTRAVENOUS | Status: AC
  Administered 2023-01-23: 500 mg via INTRAVENOUS
  Filled 2023-01-23: qty 100

## 2023-01-23 MED ORDER — VANCOMYCIN HCL IN DEXTROSE 1-5 GM/200ML-% IV SOLN
1000.0000 mg | Freq: Once | INTRAVENOUS | Status: AC
  Administered 2023-01-23: 1000 mg via INTRAVENOUS
  Filled 2023-01-23: qty 200

## 2023-01-23 MED ORDER — ASPIRIN 81 MG PO TBEC
81.0000 mg | DELAYED_RELEASE_TABLET | Freq: Every day | ORAL | Status: DC
  Administered 2023-01-23 – 2023-01-26 (×4): 81 mg via ORAL
  Filled 2023-01-23 (×4): qty 1

## 2023-01-23 MED ORDER — ACETAMINOPHEN 500 MG PO TABS
1000.0000 mg | ORAL_TABLET | Freq: Once | ORAL | Status: AC
  Administered 2023-01-23: 1000 mg via ORAL
  Filled 2023-01-23: qty 2

## 2023-01-23 MED ORDER — FLUTICASONE PROPIONATE 50 MCG/ACT NA SUSP
2.0000 | Freq: Every day | NASAL | Status: DC
Start: 2023-01-23 — End: 2023-01-26
  Administered 2023-01-25 – 2023-01-26 (×2): 2 via NASAL
  Filled 2023-01-23: qty 16

## 2023-01-23 MED ORDER — ONDANSETRON HCL 4 MG/2ML IJ SOLN: 4.0000 mg | Freq: Four times a day (QID) | INTRAMUSCULAR | Status: DC | PRN

## 2023-01-23 MED ORDER — VENLAFAXINE HCL ER 75 MG PO CP24
75.0000 mg | ORAL_CAPSULE | Freq: Every day | ORAL | Status: DC
  Administered 2023-01-23 – 2023-01-25 (×2): 75 mg via ORAL
  Filled 2023-01-23 (×2): qty 1

## 2023-01-23 MED ORDER — IPRATROPIUM-ALBUTEROL 0.5-2.5 (3) MG/3ML IN SOLN
3.0000 mL | Freq: Four times a day (QID) | RESPIRATORY_TRACT | Status: DC
  Administered 2023-01-23 – 2023-01-25 (×9): 3 mL via RESPIRATORY_TRACT
  Filled 2023-01-23 (×9): qty 3

## 2023-01-23 MED ORDER — ACETAMINOPHEN 325 MG PO TABS
650.0000 mg | ORAL_TABLET | Freq: Four times a day (QID) | ORAL | Status: DC | PRN
  Administered 2023-01-24 – 2023-01-25 (×2): 650 mg via ORAL
  Filled 2023-01-23 (×2): qty 2

## 2023-01-23 MED ORDER — ROSUVASTATIN CALCIUM 20 MG PO TABS
10.0000 mg | ORAL_TABLET | Freq: Every day | ORAL | Status: DC
  Administered 2023-01-23: 10 mg via ORAL
  Filled 2023-01-23: qty 1

## 2023-01-23 MED ORDER — ACETAMINOPHEN 650 MG RE SUPP: 650.0000 mg | Freq: Four times a day (QID) | RECTAL | Status: DC | PRN

## 2023-01-23 MED ORDER — OXYCODONE HCL 5 MG PO TABS
5.0000 mg | ORAL_TABLET | ORAL | Status: DC | PRN
  Administered 2023-01-23 – 2023-01-24 (×5): 5 mg via ORAL
  Filled 2023-01-23 (×5): qty 1

## 2023-01-23 MED ORDER — ALBUTEROL SULFATE (2.5 MG/3ML) 0.083% IN NEBU
2.5000 mg | INHALATION_SOLUTION | RESPIRATORY_TRACT | Status: DC | PRN
  Administered 2023-01-25: 2.5 mg via RESPIRATORY_TRACT
  Filled 2023-01-23 (×2): qty 3

## 2023-01-23 MED ORDER — MORPHINE SULFATE (PF) 4 MG/ML IV SOLN
4.0000 mg | Freq: Once | INTRAVENOUS | Status: AC
  Administered 2023-01-23: 4 mg via INTRAVENOUS
  Filled 2023-01-23: qty 1

## 2023-01-23 MED ORDER — ONDANSETRON HCL 4 MG PO TABS: 4.0000 mg | ORAL_TABLET | Freq: Four times a day (QID) | ORAL | Status: DC | PRN

## 2023-01-23 MED ORDER — INSULIN ASPART 100 UNIT/ML IJ SOLN
0.0000 [IU] | Freq: Three times a day (TID) | INTRAMUSCULAR | Status: DC
  Administered 2023-01-23 – 2023-01-25 (×5): 2 [IU] via SUBCUTANEOUS
  Administered 2023-01-25: 3 [IU] via SUBCUTANEOUS
  Administered 2023-01-26: 2 [IU] via SUBCUTANEOUS
  Filled 2023-01-23: qty 0.15

## 2023-01-23 MED ORDER — OXYCODONE-ACETAMINOPHEN 10-325 MG PO TABS: 1.0000 | ORAL_TABLET | Freq: Three times a day (TID) | ORAL | Status: DC | PRN

## 2023-01-23 MED ORDER — INSULIN ASPART 100 UNIT/ML IJ SOLN
0.0000 [IU] | Freq: Every day | INTRAMUSCULAR | Status: DC
  Filled 2023-01-23: qty 0.05

## 2023-01-23 MED ORDER — PANTOPRAZOLE SODIUM 40 MG PO TBEC
40.0000 mg | DELAYED_RELEASE_TABLET | Freq: Every day | ORAL | Status: DC
  Administered 2023-01-23 – 2023-01-26 (×4): 40 mg via ORAL
  Filled 2023-01-23 (×4): qty 1

## 2023-01-23 MED ORDER — SODIUM CHLORIDE 0.9 % IV SOLN
2.0000 g | Freq: Once | INTRAVENOUS | Status: AC
  Administered 2023-01-23: 2 g via INTRAVENOUS
  Filled 2023-01-23: qty 10

## 2023-01-23 MED ORDER — LACTATED RINGERS IV BOLUS (SEPSIS)
1000.0000 mL | Freq: Once | INTRAVENOUS | Status: AC
  Administered 2023-01-23: 1000 mL via INTRAVENOUS

## 2023-01-23 MED ORDER — SODIUM CHLORIDE 0.9 % IV SOLN
500.0000 mg | INTRAVENOUS | Status: DC
  Administered 2023-01-23 – 2023-01-26 (×4): 500 mg via INTRAVENOUS
  Filled 2023-01-23 (×4): qty 5

## 2023-01-23 MED ORDER — LACTATED RINGERS IV SOLN: INTRAVENOUS | Status: DC

## 2023-01-23 MED ORDER — ENOXAPARIN SODIUM 40 MG/0.4ML IJ SOSY
40.0000 mg | PREFILLED_SYRINGE | INTRAMUSCULAR | Status: DC
Start: 2023-01-23 — End: 2023-01-26
  Administered 2023-01-23 – 2023-01-25 (×3): 40 mg via SUBCUTANEOUS
  Filled 2023-01-23 (×3): qty 0.4

## 2023-01-23 MED ORDER — POTASSIUM CHLORIDE CRYS ER 20 MEQ PO TBCR
40.0000 meq | EXTENDED_RELEASE_TABLET | Freq: Once | ORAL | Status: AC
  Administered 2023-01-23: 40 meq via ORAL
  Filled 2023-01-23: qty 2

## 2023-01-23 MED ORDER — PREDNISONE 20 MG PO TABS
40.0000 mg | ORAL_TABLET | Freq: Every day | ORAL | Status: DC
  Administered 2023-01-23 – 2023-01-26 (×4): 40 mg via ORAL
  Filled 2023-01-23 (×4): qty 2

## 2023-01-23 MED ORDER — TRAZODONE HCL 50 MG PO TABS: 25.0000 mg | ORAL_TABLET | Freq: Every evening | ORAL | Status: DC | PRN

## 2023-01-23 NOTE — Sepsis Progress Note (Signed)
 Following for sepsis monitoring ?

## 2023-01-23 NOTE — ED Provider Notes (Signed)
 [facility]-EMERGENCY DEPT [facility] Emergency Department Provider Note MRN:  [id number]  Arrival date & time: 01/23/23     Chief Complaint   Shortness of Breath   History of Present Illness   Scott [name] is a 62 y.o. year-old male presents to the ED with chief complaint of cough and SOB.  He is concerned that he has pneumonia.  He wears O2 PRN at home.  He has needed it more frequently.  He reports thick green/yellow sputum.  History provided by patient.   Review of Systems  Pertinent positive and negative review of systems noted in HPI.    Physical Exam   Vitals:   01/23/23 0330 01/23/23 0415  BP: 125/77 (!) 140/63  Pulse: (!) 105 (!) 104  Resp: (!) 28 (!) 24  Temp:    SpO2: 92% (!) 88%    CONSTITUTIONAL:  unwell-appearing, NAD NEURO:  Alert and oriented x 3, CN 3-12 grossly intact EYES:  eyes equal and reactive ENT/NECK:  Supple, no stridor  CARDIO:  tachycardic, regular rhythm, appears well-perfused  PULM:  Moderate increased WOB, diminished lung sounds GI/GU:  non-distended,  MSK/SPINE:  No gross deformities, no edema, moves all extremities  SKIN:  no rash, atraumatic   *Additional and/or pertinent findings included in MDM below  Diagnostic and Interventional Summary    EKG Interpretation Date/Time:    Ventricular Rate:    PR Interval:    QRS Duration:    QT Interval:    QTC Calculation:   R Axis:      Text Interpretation:         Labs Reviewed  CBC WITH DIFFERENTIAL/PLATELET - Abnormal; Notable for the following components:      Result Value   Hemoglobin 12.7 (*)    All other components within normal limits  COMPREHENSIVE METABOLIC PANEL - Abnormal; Notable for the following components:   Sodium 133 (*)    Potassium 3.3 (*)    CO2 21 (*)    Glucose, Bld 119 (*)    Calcium  8.3 (*)    AST 14 (*)    Total Bilirubin 1.6 (*)    All other components within normal limits  URINALYSIS, W/ REFLEX TO CULTURE (INFECTION SUSPECTED) - Abnormal;  Notable for the following components:   Protein, ur 100 (*)    All other components within normal limits  BRAIN NATRIURETIC PEPTIDE - Abnormal; Notable for the following components:   B Natriuretic Peptide 130.3 (*)    All other components within normal limits  RESP PANEL BY RT-PCR (RSV, FLU A&B, COVID)  RVPGX2  CULTURE, BLOOD (ROUTINE X 2)  CULTURE, BLOOD (ROUTINE X 2)  PROTIME-INR  APTT  I-STAT CG4 LACTIC ACID, ED  I-STAT CG4 LACTIC ACID, ED    DG Chest 2 View  Final Result      Medications  lactated ringers  infusion ( Intravenous New Bag/Given 01/23/23 0406)  aztreonam  (AZACTAM ) 2 g in sodium chloride  0.9 % 100 mL IVPB (2 g Intravenous New Bag/Given 01/23/23 0503)  vancomycin  (VANCOCIN ) IVPB 1000 mg/200 mL premix (has no administration in time range)  lactated ringers  bolus 1,000 mL (1,000 mLs Intravenous New Bag/Given 01/23/23 0357)  metroNIDAZOLE  (FLAGYL ) IVPB 500 mg (500 mg Intravenous New Bag/Given 01/23/23 0410)  morphine  (PF) 4 MG/ML injection 4 mg (4 mg Intravenous Given 01/23/23 0400)  acetaminophen  (TYLENOL ) tablet 1,000 mg (1,000 mg Oral Given 01/23/23 0358)     Procedures  /  Critical Care .Critical Care  Performed by: Vicky Charleston, PA-C  Authorized by: Vicky Charleston, PA-C   Critical care provider statement:    Critical care time (minutes):  33   Critical care was necessary to treat or prevent imminent or life-threatening deterioration of the following conditions:  Respiratory failure   Critical care was time spent personally by me on the following activities:  Development of treatment plan with patient or surrogate, discussions with consultants, evaluation of patient's response to treatment, examination of patient, ordering and review of laboratory studies, ordering and review of radiographic studies, ordering and performing treatments and interventions, pulse oximetry, re-evaluation of patient's condition and review of old charts   ED Course and Medical  Decision Making  I have reviewed the triage vital signs, the nursing notes, and pertinent available records from the EMR.  Social Determinants Affecting Complexity of Care: Patient has no clinically significant social determinants affecting this chief complaint..   ED Course:    Medical Decision Making Patient here with thick productive cough with green/yellow sputum.  Requiring increased home O2 (only uses PRN).  Reports increased SOB.  He is concerned about pneumonia.  Low grade fever on arrival.  Tachypneic and tachycardic.  Will start IV antibiotics and fluids.  Meets SIRS criteria.   Given increased O2 demand, SIRS, thick productive cough, will admit for pneumonia.    Amount and/or Complexity of Data Reviewed Labs: ordered. Radiology: ordered.  Risk OTC drugs. Prescription drug management. Decision regarding hospitalization.         Consultants: I consulted with Hospitalist, Dr. Shona, who is appreciated for admitting.   Treatment and Plan: Patient's exam and diagnostic results are concerning for CAP.  Feel that patient will need admission to the hospital for further treatment and evaluation.    Final Clinical Impressions(s) / ED Diagnoses     ICD-10-CM   1. Community acquired pneumonia, unspecified laterality  J18.9       ED Discharge Orders     None         Discharge Instructions Discussed with and Provided to Patient:   Discharge Instructions   None      Vicky Charleston, PA-C 01/23/23 [id number]    Haze Lonni PARAS, MD 01/25/23 [phone]

## 2023-01-23 NOTE — ED Triage Notes (Signed)
 Pt has COPD and reports that he has had increased SOB and productive cough with thick, green sputum.

## 2023-01-23 NOTE — H&P (Signed)
 History and Physical  Scott [name] FMW:[id number] DOB: [birth year] DOA: 01/23/2023  PCP: Patient, No Pcp Per   Chief Complaint: cough, shortness of breath  HPI: Scott [name] is a 62 y.o. male with medical history significant for GERD, non-insulin -dependent type 2 diabetes, recent pneumonia, COPD on intermittent oxygen  being admitted to the hospital with sepsis due to suspected pneumonia.  History is provided by the patient, who states he was in his usual state of health until about 5 days ago, after he returned from a trip to [place] .  He was doing well on the trip, but on the way back he started getting increased cough, increased yellow sputum production, and shortness of breath.  He wears oxygen  as needed, but recently has had to wear 3 to 4 L of nasal cannula oxygen  at home.  He did have a subjective fever at home about 4 days ago.  No therapies prior to arrival.  Here in the emergency department, he was noted to be tachycardic, tachypneic, saturating 88% on room air and was placed on 4 L nasal cannula oxygen .  Review of Systems: Please see HPI for pertinent positives and negatives. A complete 10 system review of systems are otherwise negative.  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   COPD (chronic obstructive pulmonary disease) (HCC)    Diabetes mellitus without complication (HCC)    GERD (gastroesophageal reflux disease)    Headache(784.0)    Heart attack (HCC)    Pneumonia    hx   Past Surgical History:  Procedure Laterality Date   ANTERIOR CERVICAL DECOMP/DISCECTOMY FUSION  12/22/2010   Procedure: ANTERIOR CERVICAL DECOMPRESSION/DISCECTOMY FUSION 3 LEVELS;  Surgeon: Reyes JONETTA Budge;  Location: [facility];  Service: Neurosurgery;  Laterality: N/A;  Anterior cervical discectomy with fusion cervical three-four, four-five, and five sixCDF with Interbody Prosthesis, plating, and Bone Graft    ANTERIOR CERVICAL  DECOMP/DISCECTOMY FUSION N/A 10/16/2012   Procedure: CERVICAL SIX-SEVEN ANTERIOR CERVICAL DECOMPRESSION/DISCECTOMY FUSION WITH INTERBODY PROTHESIS PLATING BONEGRAFT WITH /POSSIBLE HARDWARE REMOVAL OLD PLATE;  Surgeon: Reyes JONETTA Budge, MD;  Location: [facility];  Service: Neurosurgery;  Laterality: N/A;   MULTIPLE TOOTH EXTRACTIONS     OTHER SURGICAL HISTORY     surgery on cheekbone and head for fall 2000   OTHER SURGICAL HISTORY     states when about 62yrs old he was urinating blood, 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 His smoking use included cigarettes. He started smoking about 50 years ago. He has a 7 pack-year smoking history. He has never used smokeless tobacco. He reports that he does not currently use alcohol . He reports that he does not use drugs.  Allergies  Allergen Reactions   Penicillins Anaphylaxis    Has patient had a PCN reaction causing immediate rash, facial/tongue/throat swelling, SOB or lightheadedness with hypotension: Yes  Has patient had a PCN reaction causing severe rash involving mucus membranes or skin necrosis: Yes  Has  patient had a PCN reaction that required hospitalization: Yes  Has patient had a PCN reaction occurring within the last 10 years: No  If all of the above answers are NO, then may proceed with Cephalosporin use.  Has patient had a PCN reaction causing immediate rash, facial/tongue/throat swelling, SOB or lightheadedness with hypotension: Yes Has patient had a PCN reaction causing severe rash involving mucus membranes or skin necrosis:  Yes Has patient had a PCN reaction that required hospitalization: Yes Has patient had a PCN reaction occurring within the last 10 years: No If all of the above answers are NO, then may proceed with Cephalosporin use.   Cymbalta  [Duloxetine  Hcl]     crazy thoughts    Family History  Problem Relation Age of Onset   Emphysema Mother    COPD Mother    Asthma Brother    Diabetes Brother    Hyperlipidemia Brother    Hypertension Brother    Asthma Sister    Hyperlipidemia Sister    Hypertension Sister    Mental illness Sister    Heart disease Maternal Uncle    Hypertension Brother      Prior to Admission medications   Medication Sig Start Date End Date Taking? Authorizing Provider  venlafaxine  XR (EFFEXOR -XR) 75 MG 24 hr capsule Take 75 mg by mouth daily. 01/23/23  Yes [provider]  aspirin  EC 81 MG tablet Take 81 mg by mouth daily.    [provider]  baclofen  (LIORESAL ) 10 MG tablet Take 10 mg by mouth 2 (two) times daily. 04/03/22   [provider]  bisoprolol  (ZEBETA ) 5 MG tablet Take 1 tablet (5 mg total) by mouth daily. 09/15/22   Lavona Agent, MD  Cholecalciferol 1.25 MG (50000 UT) capsule Take by mouth. 05/31/22   [provider]  fluconazole  (DIFLUCAN ) 100 MG tablet Take 1 tablet (100 mg total) by mouth daily. 12/27/22   Shelah Lamar RAMAN, MD  fluticasone  (FLONASE ) 50 MCG/ACT nasal spray USE 2 SPRAYS NASALLY EVERY DAY 05/25/21   Byrum, Robert S, MD  fluticasone -salmeterol (ADVAIR ) 250-50 MCG/ACT AEPB Inhale 1 puff into the lungs every 12 (twelve) hours. 08/30/22   Shelah Lamar RAMAN, MD  furosemide  (LASIX ) 20 MG tablet Take 1 tablet (20 mg total) by mouth every other day. Take one half of a tablet every other day. 12/18/22   Lavona Agent, MD  gabapentin  (NEURONTIN ) 300 MG capsule TAKE 1 CAPSULE BY MOUTH 3 TIMES DAILY 11/21/18   Melonie Colonel, Mikel HERO, MD  Ipratropium-Albuterol  (COMBIVENT  RESPIMAT) 20-100 MCG/ACT AERS respimat INHALE 1 PUFF BY MOUTH  FOUR TIMES DAILY 12/27/22   Shelah Lamar RAMAN, MD  ipratropium-albuterol  (DUONEB) 0.5-2.5 (3) MG/3ML SOLN Inhale 1 vial via nebulizer every 6 hours as needed 12/27/22   Byrum, Robert S, MD  JANUMET  50-500 MG tablet TAKE 1 TABLET BY MOUTH ONCE A DAY 07/31/18   Melonie Colonel, Mikel HERO, MD  NARCAN 4 MG/0.1ML LIQD nasal spray kit Place 1 spray into the nose daily as needed (For overdose).  06/13/18   [provider]  nystatin  (MYCOSTATIN ) 100000 UNIT/ML suspension Take 5 mLs (500,000 Units total) by mouth 4 (four) times daily. 12/27/22   Shelah Lamar RAMAN, MD  omeprazole  (PRILOSEC) 40 MG capsule TAKE 1 CAPSULE BY MOUTH EVERY DAY 01/08/19   Melonie Colonel, Mikel HERO, MD  oxyCODONE -acetaminophen  (PERCOCET) 10-325 MG tablet Take 1 tablet by mouth every 8 (eight) hours as needed for pain. 11/22/22   Raenelle Coria, MD  OXYGEN  Inhale into  the lungs.    [provider]  pregabalin  (LYRICA ) 50 MG capsule Take 50 mg by mouth 3 (three) times daily.    [provider]  rosuvastatin  (CRESTOR ) 10 MG tablet Take 1 tablet (10 mg total) by mouth daily. 09/13/21   Daneen Damien BROCKS, NP  sacubitril -valsartan  (ENTRESTO ) 97-103 MG Take 1 tablet by mouth 2 (two) times daily. 09/15/22   Lavona Agent, MD  sildenafil  (VIAGRA ) 50 MG tablet Take 1 tablet (50 mg total) by mouth daily as needed for erectile dysfunction. 12/18/22   Lavona Agent, MD  VENTOLIN  HFA 108 (90 Base) MCG/ACT inhaler INHALE 1 TO 2 PUFFS BY MOUTH EVERY 6 HOURS AS NEEDED FOR WHEEZING OR SHORTNESS OF BREATH 12/27/22   Shelah Lamar RAMAN, MD  Vitamin D , Ergocalciferol , (DRISDOL ) 1.25 MG (50000 UT) CAPS capsule TAKE ONE CAPSULE BY MOUTH ONCE WEEKLY 12/18/18   Melonie Colonel, Mikel HERO, MD    Physical Exam: BP 131/88   Pulse (!) 102   Temp 98.8 F (37.1 C) (Oral)   Resp (!) 23   SpO2 (!) 89%  General:  Alert, oriented, calm, in no acute distress, intermittent cough, wearing 4 L nasal cannula oxygen .  Speaking full sentences  comfortably. Cardiovascular: RRR, no murmurs or rubs, no peripheral edema  Respiratory: Breath sounds are diminished, especially in the left lung.  Some end expiratory wheezing, and rhonchi on the right. Abdomen: soft, nontender, nondistended, normal bowel tones heard  Skin: dry, no rashes  Musculoskeletal: no joint effusions, normal range of motion  Psychiatric: appropriate affect, normal speech  Neurologic: extraocular muscles intact, clear speech, moving all extremities with intact sensorium         Labs on Admission:  Basic Metabolic Panel: Recent Labs  Lab 01/23/23 0352  NA 133*  K 3.3*  CL 101  CO2 21*  GLUCOSE 119*  BUN 12  CREATININE 0.85  CALCIUM  8.3*   Liver Function Tests: Recent Labs  Lab 01/23/23 0352  AST 14*  ALT 9  ALKPHOS 63  BILITOT 1.6*  PROT 7.4  ALBUMIN  3.9   No results for input(s): LIPASE, AMYLASE in the last 168 hours. No results for input(s): AMMONIA in the last 168 hours. CBC: Recent Labs  Lab 01/23/23 0352  WBC 7.1  NEUTROABS 5.3  HGB 12.7*  HCT 39.7  MCV 88.4  PLT 180   Cardiac Enzymes: No results for input(s): CKTOTAL, CKMB, CKMBINDEX, TROPONINI in the last 168 hours. BNP (last 3 results) Recent Labs    11/19/22 2102 01/23/23 0352  BNP 116.3* 130.3*    ProBNP (last 3 results) No results for input(s): PROBNP in the last 8760 hours.  CBG: No results for input(s): GLUCAP in the last 168 hours.  Radiological Exams on Admission: DG Chest 2 View Result Date: 01/23/2023 CLINICAL DATA:  Shortness of breath.  History of COPD and emphysema. EXAM: CHEST - 2 VIEW COMPARISON:  11/19/2022 FINDINGS: Chronic hyperinflation. Chronic but increased bronchial thickening. No focal airspace disease. Stable heart size and mediastinal contours. No pleural effusion or pneumothorax. Lower cervical hardware partially included. IMPRESSION: Chronic hyperinflation. Chronic but increased bronchial thickening which may represent COPD  exacerbation. No focal airspace disease. Electronically Signed   By: Andrea Gasman M.D.   On: 01/23/2023 04:31   Assessment/Plan Scott [name] is a 62 y.o. male with medical history significant for GERD, non-insulin -dependent type 2 diabetes, recent pneumonia, COPD on intermittent oxygen  being admitted to the hospital with sepsis due to suspected pneumonia.   Acute hypoxic  respiratory failure-with increased cough, subjective fever, thick yellow sputum production.  However no leukocytosis, chest x-ray with no focal airspace disease.  Likely due to acute exacerbation of COPD, with possible early pneumonia. -Observation admission -Supplemental oxygen , wean as tolerated with goal O2 saturation greater than 90% -Treat suspected pneumonia and AECOPD as below -Check viral respiratory panel, with contact and droplet precautions in the meantime  Sepsis due to suspected community-acquired pneumonia-with tachycardia, tachypnea, subjective fever, increased sputum production.  No evidence of endorgan dysfunction. -Empiric IV azithromycin   Acute exacerbation of COPD-given increased cough, dyspnea, sputum production, and chest x-ray without obvious consolidation. -Supplemental oxygen  as above -Scheduled DuoNebs, as needed albuterol  -Prednisone  40 mg p.o. daily -Incentive spirometer, and flutter valve  Chronic diastolic congestive heart failure-patient appears euvolemic on exam without evidence of acute exacerbation -Continue Entresto   GERD-p.o. PPI  Type 2 diabetes-carb controlled diet, moderate dose sliding scale  Chronic pain syndrome-continue home gabapentin , baclofen  and Percocet once reconciled  DVT prophylaxis: Lovenox      Code Status: Full Code  Consults called: None  Admission status: Observation  Time spent: 49 minutes  Amelya Mabry CHRISTELLA Gail MD [facility] Pager [phone]  If 7PM-7AM, please contact night-coverage [email or web address] Password [email or web address]  01/23/2023, 8:32 AM

## 2023-01-24 DIAGNOSIS — Z880 Allergy status to penicillin: Secondary | ICD-10-CM | POA: Diagnosis not present

## 2023-01-24 DIAGNOSIS — R6510 Systemic inflammatory response syndrome (SIRS) of non-infectious origin without acute organ dysfunction: Secondary | ICD-10-CM | POA: Diagnosis present

## 2023-01-24 DIAGNOSIS — Z6831 Body mass index (BMI) 31.0-31.9, adult: Secondary | ICD-10-CM | POA: Diagnosis not present

## 2023-01-24 DIAGNOSIS — J189 Pneumonia, unspecified organism: Secondary | ICD-10-CM | POA: Diagnosis present

## 2023-01-24 DIAGNOSIS — I428 Other cardiomyopathies: Secondary | ICD-10-CM | POA: Diagnosis present

## 2023-01-24 DIAGNOSIS — J441 Chronic obstructive pulmonary disease with (acute) exacerbation: Secondary | ICD-10-CM | POA: Diagnosis present

## 2023-01-24 DIAGNOSIS — E1140 Type 2 diabetes mellitus with diabetic neuropathy, unspecified: Secondary | ICD-10-CM | POA: Diagnosis present

## 2023-01-24 DIAGNOSIS — E1165 Type 2 diabetes mellitus with hyperglycemia: Secondary | ICD-10-CM | POA: Diagnosis present

## 2023-01-24 DIAGNOSIS — K219 Gastro-esophageal reflux disease without esophagitis: Secondary | ICD-10-CM | POA: Diagnosis present

## 2023-01-24 DIAGNOSIS — Z79899 Other long term (current) drug therapy: Secondary | ICD-10-CM | POA: Diagnosis not present

## 2023-01-24 DIAGNOSIS — J9621 Acute and chronic respiratory failure with hypoxia: Secondary | ICD-10-CM | POA: Diagnosis present

## 2023-01-24 DIAGNOSIS — Z888 Allergy status to other drugs, medicaments and biological substances status: Secondary | ICD-10-CM | POA: Diagnosis not present

## 2023-01-24 DIAGNOSIS — Z8249 Family history of ischemic heart disease and other diseases of the circulatory system: Secondary | ICD-10-CM | POA: Diagnosis not present

## 2023-01-24 DIAGNOSIS — Z87891 Personal history of nicotine dependence: Secondary | ICD-10-CM | POA: Diagnosis not present

## 2023-01-24 DIAGNOSIS — Z91199 Patient's noncompliance with other medical treatment and regimen due to unspecified reason: Secondary | ICD-10-CM | POA: Diagnosis not present

## 2023-01-24 DIAGNOSIS — Z825 Family history of asthma and other chronic lower respiratory diseases: Secondary | ICD-10-CM | POA: Diagnosis not present

## 2023-01-24 DIAGNOSIS — I252 Old myocardial infarction: Secondary | ICD-10-CM | POA: Diagnosis not present

## 2023-01-24 DIAGNOSIS — Z7982 Long term (current) use of aspirin: Secondary | ICD-10-CM | POA: Diagnosis not present

## 2023-01-24 DIAGNOSIS — Z1152 Encounter for screening for COVID-19: Secondary | ICD-10-CM | POA: Diagnosis not present

## 2023-01-24 DIAGNOSIS — I959 Hypotension, unspecified: Secondary | ICD-10-CM | POA: Diagnosis present

## 2023-01-24 DIAGNOSIS — J9601 Acute respiratory failure with hypoxia: Secondary | ICD-10-CM | POA: Diagnosis not present

## 2023-01-24 DIAGNOSIS — G894 Chronic pain syndrome: Secondary | ICD-10-CM | POA: Diagnosis present

## 2023-01-24 DIAGNOSIS — Z7984 Long term (current) use of oral hypoglycemic drugs: Secondary | ICD-10-CM | POA: Diagnosis not present

## 2023-01-24 DIAGNOSIS — E669 Obesity, unspecified: Secondary | ICD-10-CM | POA: Diagnosis present

## 2023-01-24 DIAGNOSIS — I5042 Chronic combined systolic (congestive) and diastolic (congestive) heart failure: Secondary | ICD-10-CM | POA: Diagnosis present

## 2023-01-24 DIAGNOSIS — E119 Type 2 diabetes mellitus without complications: Secondary | ICD-10-CM | POA: Diagnosis not present

## 2023-01-24 DIAGNOSIS — Z981 Arthrodesis status: Secondary | ICD-10-CM | POA: Diagnosis not present

## 2023-01-24 LAB — CBC
HCT: 37.2 % — ABNORMAL LOW (ref 39.0–52.0)
Hemoglobin: 12 g/dL — ABNORMAL LOW (ref 13.0–17.0)
MCH: 28.5 pg (ref 26.0–34.0)
MCHC: 32.3 g/dL (ref 30.0–36.0)
MCV: 88.4 fL (ref 80.0–100.0)
Platelets: 174 10*3/uL (ref 150–400)
RBC: 4.21 MIL/uL — ABNORMAL LOW (ref 4.22–5.81)
RDW: 14.1 % (ref 11.5–15.5)
WBC: 5.9 10*3/uL (ref 4.0–10.5)
nRBC: 0 % (ref 0.0–0.2)

## 2023-01-24 LAB — BASIC METABOLIC PANEL
Anion gap: 6 (ref 5–15)
BUN: 14 mg/dL (ref 8–23)
CO2: 26 mmol/L (ref 22–32)
Calcium: 8.9 mg/dL (ref 8.9–10.3)
Chloride: 107 mmol/L (ref 98–111)
Creatinine, Ser: 0.77 mg/dL (ref 0.61–1.24)
GFR, Estimated: >60 mL/min (ref >60)
Glucose, Bld: 111 mg/dL — ABNORMAL HIGH (ref 70–99)
Potassium: 3.9 mmol/L (ref 3.5–5.1)
Sodium: 139 mmol/L (ref 135–145)

## 2023-01-24 LAB — PROCALCITONIN: Procalcitonin: <0.1 ng/mL

## 2023-01-24 LAB — GLUCOSE, CAPILLARY
Glucose-Capillary: 147 mg/dL — ABNORMAL HIGH (ref 70–99)
Glucose-Capillary: 136 mg/dL — ABNORMAL HIGH (ref 70–99)

## 2023-01-24 LAB — CBG MONITORING, ED
Glucose-Capillary: 99 mg/dL (ref 70–99)
Glucose-Capillary: 134 mg/dL — ABNORMAL HIGH (ref 70–99)

## 2023-01-24 MED ORDER — ALUM & MAG HYDROXIDE-SIMETH 200-200-20 MG/5ML PO SUSP
30.0000 mL | ORAL | Status: DC | PRN
  Administered 2023-01-24: 30 mL via ORAL
  Filled 2023-01-24: qty 30

## 2023-01-24 MED ORDER — OXYCODONE HCL 5 MG PO TABS
5.0000 mg | ORAL_TABLET | Freq: Four times a day (QID) | ORAL | Status: DC | PRN
  Administered 2023-01-24 – 2023-01-25 (×5): 5 mg via ORAL
  Filled 2023-01-24 (×5): qty 1

## 2023-01-24 MED ORDER — BACLOFEN 10 MG PO TABS
10.0000 mg | ORAL_TABLET | Freq: Two times a day (BID) | ORAL | Status: DC
  Administered 2023-01-24 – 2023-01-26 (×5): 10 mg via ORAL
  Filled 2023-01-24 (×5): qty 1

## 2023-01-24 MED ORDER — ROSUVASTATIN CALCIUM 10 MG PO TABS
10.0000 mg | ORAL_TABLET | Freq: Every day | ORAL | Status: DC
  Administered 2023-01-24 – 2023-01-25 (×2): 10 mg via ORAL
  Filled 2023-01-24 (×2): qty 1

## 2023-01-24 MED ORDER — PREGABALIN 50 MG PO CAPS
50.0000 mg | ORAL_CAPSULE | Freq: Three times a day (TID) | ORAL | Status: DC
  Administered 2023-01-24: 50 mg via ORAL
  Filled 2023-01-24 (×3): qty 1

## 2023-01-24 MED ORDER — BISOPROLOL FUMARATE 5 MG PO TABS
5.0000 mg | ORAL_TABLET | Freq: Every day | ORAL | Status: DC
  Administered 2023-01-24 – 2023-01-26 (×3): 5 mg via ORAL
  Filled 2023-01-24 (×3): qty 1

## 2023-01-24 MED ORDER — SACUBITRIL-VALSARTAN 97-103 MG PO TABS
1.0000 | ORAL_TABLET | Freq: Two times a day (BID) | ORAL | Status: DC
  Administered 2023-01-24 (×2): 1 via ORAL
  Filled 2023-01-24 (×3): qty 1

## 2023-01-24 NOTE — ED Notes (Signed)
 Pt received lunch tray

## 2023-01-24 NOTE — Care Management Obs Status (Signed)
 MEDICARE OBSERVATION STATUS NOTIFICATION   Patient Details  Name: Scott [name] MRN: [id number] Date of Birth: 11-09-1960   Medicare Observation Status Notification Given:  Yes    Princella Ion, LCSW 01/24/2023, 10:55 AM

## 2023-01-24 NOTE — Progress Notes (Signed)
 PROGRESS NOTE  Scott [name] FMW:[id number] DOB: 24-Dec-1960   PCP: Patient, No Pcp Per  Patient is from: Home.  Independently ambulates at baseline.  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  Symptoms started after he returned from his trip to [place] .  No sick contacts he can recall.  Patient was hypoxic to 88% on 4 L.  CXR with hyperinflation but no infiltrate.  COVID-19, influenza and RSV PCR nonreactive.  RVP and cultures ordered.  Admitted with working diagnosis of acute on chronic respiratory failure with hypoxia due to COPD exacerbation and possible pneumonia.  Received broad-spectrum antibiotics, steroid and breathing treatments in ED.  Admitted on steroid, nebulizers and Zithromax .   Subjective: Seen and examined earlier this morning.  No major events overnight of this morning.  Continues to endorse productive cough with greenish phlegm.  Noted some improvement in his breathing but not quite back to baseline.  Also complains about his neck pain.  This is chronic issue for which he has upcoming appointment at pain clinic next week.  Also reports chest and abdominal pain from coughing.  Denies nausea or vomiting.  Objective: Vitals:   01/24/23 0530 01/24/23 0600 01/24/23 0630 01/24/23 0930  BP: (!) 137/112 129/78 117/72 123/86  Pulse: 76 99 72 88  Resp: 14 (!) 26 (!) 23 (!) 21  Temp:    98.2 F (36.8 C)  TempSrc:      SpO2: 92% 97% 96% 98%    Examination:  GENERAL: No apparent distress.  Nontoxic. HEENT: MMM.  Vision and hearing grossly intact.  NECK: Supple.  No apparent JVD.  RESP:  No IWOB.  Frequent cough during exam.  Rhonchi bilaterally. CVS:  RRR. Heart  sounds normal.  ABD/GI/GU: BS+. Abd soft, NTND.  MSK/EXT:  Moves extremities. No apparent deformity. No edema.  SKIN: no apparent skin lesion or wound NEURO: Awake, alert and oriented appropriately.  No apparent focal neuro deficit. PSYCH: Calm. Normal affect.   Procedures:  None  Microbiology summarized: COVID-19, influenza and RSV PCR nonreactive Blood cultures NGTD Full RVP pending Respiratory culture pending.  Gram stain with GNR and GPC's.  Assessment and plan: Acute on chronic respiratory failure with hypoxia due to COPD exacerbation: Unclear cause of his exacerbation but there might be another sick contact from recent travel.  Also history of noncompliance with breathing treatments.  Smoking in April 2024.  He is followed by Dr. Shelah outpatient.  CXR without acute finding but still with tachypnea and patient productive cough with greenish phlegm.  Sputum with GNR and GPC's on Gram stain.  However, procalcitonin is negative. -Continue steroid and Zithromax .   -Wean oxygen  to home 3 L.  Incentive spirometry and flutter valve.  Ambulatory saturation. -Continue scheduled and as needed nebulizers -Continue Protonix  -Follow cultures and RVP.  SIRS: Heart tachycardia and tachypnea on presentation.  No objective fever although he reported some subjective fever at home.  No leukocytosis.  Procalcitonin negative.  No infiltrate on chest x-ray.  Sepsis physiology improved.  Pneumonia and sepsis ruled out. -Management as above    Chronic combined CHF with improved EF: TTE in 2020 with LVEF of 30 to 35% but improved  to 60 to 65% in 2022.  Appears euvolemic on exam.  On p.o. Lasix  20 mg every other day. -Hold diuretics. -Continue home Entresto  and Zebeta . -Monitor fluid and respiratory status   NIDDM-2 with hyperglycemia: A1c 6.3% on 10/28.  Takes Janumet  at home. Recent Labs  Lab 01/23/23 1259 01/23/23 1635 01/23/23 2118 01/24/23 0844  GLUCAP 140* 141* 159* 99  -Continue current  insulin  regimen  Chronic back pain/neuropathy: Reports taking oxycodone  at home but last fill was on 11/13 only for 5 days.  He has upcoming appointment at pain clinic next week. -continue home Lyrica  and baclofen . -Tylenol  and oxycodone  as needed -Outpatient follow-up.  There is no height or weight on file to calculate BMI.          DVT prophylaxis:  enoxaparin  (LOVENOX ) injection 40 mg Start: 01/23/23 1200 SCDs Start: 01/23/23 0831  Code Status: Full code Family Communication: None at bedside Level of care: Telemetry Status is: Observation The patient will require care spanning > 2 midnights and should be moved to inpatient because: Acute on chronic respiratory failure with hypoxia due to COPD exacerbation   Final disposition: Home Consultants:  None  55 minutes with more than 50% spent in reviewing records, counseling patient/family and coordinating care.   Sch Meds:  Scheduled Meds:  aspirin  EC  81 mg Oral Daily   enoxaparin  (LOVENOX ) injection  40 mg Subcutaneous Q24H   fluticasone   2 spray Each Nare Daily   insulin  aspart  0-15 Units Subcutaneous TID WC   insulin  aspart  0-5 Units Subcutaneous QHS   ipratropium-albuterol   3 mL Nebulization QID   pantoprazole   40 mg Oral Daily   predniSONE   40 mg Oral Q breakfast   rosuvastatin   10 mg Oral QHS   venlafaxine  XR  75 mg Oral Daily   Continuous Infusions:  azithromycin  500 mg (01/24/23 0924)   PRN Meds:.acetaminophen  **OR** acetaminophen , albuterol , alum & mag hydroxide-simeth, ondansetron  **OR** ondansetron  (ZOFRAN ) IV, oxyCODONE , traZODone   Antimicrobials: Anti-infectives (From admission, onward)    Start     Dose/Rate Route Frequency Ordered Stop   01/23/23 0845  azithromycin  (ZITHROMAX ) 500 mg in sodium chloride  0.9 % 250 mL IVPB        500 mg 250 mL/hr over 60 Minutes Intravenous Every 24 hours 01/23/23 0832 01/28/23 0844   01/23/23 0315  aztreonam  (AZACTAM ) 2 g in sodium chloride  0.9 % 100 mL IVPB         2 g 200 mL/hr over 30 Minutes Intravenous  Once 01/23/23 0305 01/23/23 0734   01/23/23 0315  metroNIDAZOLE  (FLAGYL ) IVPB 500 mg        500 mg 100 mL/hr over 60 Minutes Intravenous  Once 01/23/23 0305 01/23/23 0540   01/23/23 0315  vancomycin  (VANCOCIN ) IVPB 1000 mg/200 mL premix        1,000 mg 200 mL/hr over 60 Minutes Intravenous  Once 01/23/23 0305 01/23/23 0734        I have personally reviewed the following labs and images: CBC: Recent Labs  Lab 01/23/23 0352 01/24/23 0452  WBC 7.1 5.9  NEUTROABS 5.3  --   HGB 12.7* 12.0*  HCT 39.7 37.2*  MCV 88.4 88.4  PLT 180 174   BMP &GFR Recent Labs  Lab 01/23/23 0352 01/24/23 0452  NA 133* 139  K 3.3* 3.9  CL 101 107  CO2 21* 26  GLUCOSE 119* 111*  BUN 12 14  CREATININE 0.85 0.77  CALCIUM  8.3* 8.9   CrCl cannot be calculated (  Unknown ideal weight.). Liver & Pancreas: Recent Labs  Lab 01/23/23 0352  AST 14*  ALT 9  ALKPHOS 63  BILITOT 1.6*  PROT 7.4  ALBUMIN  3.9   No results for input(s): LIPASE, AMYLASE in the last 168 hours. No results for input(s): AMMONIA in the last 168 hours. Diabetic: No results for input(s): HGBA1C in the last 72 hours. Recent Labs  Lab 01/23/23 1259 01/23/23 1635 01/23/23 2118 01/24/23 0844  GLUCAP 140* 141* 159* 99   Cardiac Enzymes: No results for input(s): CKTOTAL, CKMB, CKMBINDEX, TROPONINI in the last 168 hours. No results for input(s): PROBNP in the last 8760 hours. Coagulation Profile: Recent Labs  Lab 01/23/23 0352  INR 1.2   Thyroid  Function Tests: No results for input(s): TSH, T4TOTAL, FREET4, T3FREE, THYROIDAB in the last 72 hours. Lipid Profile: No results for input(s): CHOL, HDL, LDLCALC, TRIG, CHOLHDL, LDLDIRECT in the last 72 hours. Anemia Panel: No results for input(s): VITAMINB12, FOLATE, FERRITIN, TIBC, IRON, RETICCTPCT in the last 72 hours. Urine analysis:    Component Value Date/Time    COLORURINE YELLOW 01/23/2023 0305   APPEARANCEUR CLEAR 01/23/2023 0305   LABSPEC 1.030 01/23/2023 0305   PHURINE 5.0 01/23/2023 0305   GLUCOSEU NEGATIVE 01/23/2023 0305   HGBUR NEGATIVE 01/23/2023 0305   BILIRUBINUR NEGATIVE 01/23/2023 0305   KETONESUR NEGATIVE 01/23/2023 0305   PROTEINUR 100 (A) 01/23/2023 0305   NITRITE NEGATIVE 01/23/2023 0305   LEUKOCYTESUR NEGATIVE 01/23/2023 0305   Sepsis Labs: Invalid input(s): PROCALCITONIN, LACTICIDVEN  Microbiology: Recent Results (from the past 240 hours)  Resp panel by RT-PCR (RSV, Flu A&B, Covid) Anterior Nasal Swab     Status: None   Collection Time: 01/23/23  3:05 AM   Specimen: Anterior Nasal Swab  Result Value Ref Range Status   SARS Coronavirus 2 by RT PCR NEGATIVE NEGATIVE Final    Comment: (NOTE) SARS-CoV-2 target nucleic acids are NOT DETECTED.  The SARS-CoV-2 RNA is generally detectable in upper respiratory specimens during the acute phase of infection. The lowest concentration of SARS-CoV-2 viral copies this assay can detect is 138 copies/mL. A negative result does not preclude SARS-Cov-2 infection and should not be used as the sole basis for treatment or other patient management decisions. A negative result may occur with  improper specimen collection/handling, submission of specimen other than nasopharyngeal swab, presence of viral mutation(s) within the areas targeted by this assay, and inadequate number of viral copies(<138 copies/mL). A negative result must be combined with clinical observations, patient history, and epidemiological information. The expected result is Negative.  Fact Sheet for Patients:  bloggercourse.com  Fact Sheet for Healthcare Providers:  seriousbroker.it  This test is no t yet approved or cleared by the [place]  FDA and  has been authorized for detection and/or diagnosis of SARS-CoV-2 by FDA under an Emergency Use Authorization  (EUA). This EUA will remain  in effect (meaning this test can be used) for the duration of the COVID-19 declaration under Section 564(b)(1) of the Act, 21 U.S.C.section 360bbb-3(b)(1), unless the authorization is terminated  or revoked sooner.       Influenza A by PCR NEGATIVE NEGATIVE Final   Influenza B by PCR NEGATIVE NEGATIVE Final    Comment: (NOTE) The Xpert Xpress SARS-CoV-2/FLU/RSV plus assay is intended as an aid in the diagnosis of influenza from Nasopharyngeal swab specimens and should not be used as a sole basis for treatment. Nasal washings and aspirates are unacceptable for Xpert Xpress SARS-CoV-2/FLU/RSV testing.  Fact Sheet for Patients: bloggercourse.com  Fact Sheet for Healthcare Providers: seriousbroker.it  This test is not yet approved or cleared by the [place]  FDA and has been authorized for detection and/or diagnosis of SARS-CoV-2 by FDA under an Emergency Use Authorization (EUA). This EUA will remain in effect (meaning this test can be used) for the duration of the COVID-19 declaration under Section 564(b)(1) of the Act, 21 U.S.C. section 360bbb-3(b)(1), unless the authorization is terminated or revoked.     Resp Syncytial Virus by PCR NEGATIVE NEGATIVE Final    Comment: (NOTE) Fact Sheet for Patients: bloggercourse.com  Fact Sheet for Healthcare Providers: seriousbroker.it  This test is not yet approved or cleared by the [place]  FDA and has been authorized for detection and/or diagnosis of SARS-CoV-2 by FDA under an Emergency Use Authorization (EUA). This EUA will remain in effect (meaning this test can be used) for the duration of the COVID-19 declaration under Section 564(b)(1) of the Act, 21 U.S.C. section 360bbb-3(b)(1), unless the authorization is terminated or revoked.  Performed at [facility], [address] [place], [place] [address]   Blood Culture (routine x 2)     Status: None (Preliminary result)   Collection Time: 01/23/23  3:52 AM   Specimen: BLOOD RIGHT ARM  Result Value Ref Range Status   Specimen Description   Final    BLOOD RIGHT ARM Performed at [facility], [address] [place], [place] [address]    Special Requests   Final    BOTTLES DRAWN AEROBIC AND ANAEROBIC Blood Culture results may not be optimal due to an inadequate volume of blood received in culture bottles Performed at [facility], [address] [place], [place] [address]    Culture   Final    NO GROWTH 1 DAY Performed at [facility], [address] [place], [place] [address]    Report Status PENDING  Incomplete  Blood Culture (routine x 2)     Status: None (Preliminary result)   Collection Time: 01/23/23  3:52 AM   Specimen: BLOOD LEFT ARM  Result Value Ref Range Status   Specimen Description   Final    BLOOD LEFT ARM Performed at [facility], [address] [place], [place] [address]    Special Requests   Final    BOTTLES DRAWN AEROBIC AND ANAEROBIC Blood Culture results may not be optimal due to an inadequate volume of blood received in culture bottles Performed at [facility], [address], [place], [place] [address]    Culture   Final    NO GROWTH 1 DAY Performed at [facility], [address], [place], [place] [address]    Report Status PENDING  Incomplete  Expectorated Sputum Assessment w Gram Stain, Rflx to Resp Cult     Status: None   Collection Time: 01/23/23 11:02 AM   Specimen: Sputum  Result Value Ref Range Status   Specimen Description SPUTUM  Final   Special Requests NONE  Final   Sputum evaluation   Final    THIS SPECIMEN IS ACCEPTABLE FOR SPUTUM CULTURE Performed at [facility], [address], [place], [place] [address]    Report Status 01/23/2023 FINAL  Final   Culture, Respiratory w Gram Stain     Status: None (Preliminary result)   Collection Time: 01/23/23 11:02 AM   Specimen: SPU  Result Value Ref Range Status   Specimen Description   Final  SPUTUM Performed at [facility], [address] [place], [place] [address]    Special Requests   Final    NONE Reflexed from [id number] Performed at [facility], [address], [place], [place] [address]    Gram Stain   Final    MODERATE WBC PRESENT,BOTH PMN AND MONONUCLEAR MODERATE GRAM POSITIVE COCCI FEW GRAM NEGATIVE RODS Performed at [facility], [address], [place], [place] [address]    Culture PENDING  Incomplete   Report Status PENDING  Incomplete    Radiology Studies: No results found.    Desiraye Rolfson T. Jael Waldorf [facility]  If 7PM-7AM, please contact night-coverage [email or web address] 01/24/2023, 11:31 AM

## 2023-01-25 DIAGNOSIS — G894 Chronic pain syndrome: Secondary | ICD-10-CM | POA: Diagnosis not present

## 2023-01-25 DIAGNOSIS — J441 Chronic obstructive pulmonary disease with (acute) exacerbation: Secondary | ICD-10-CM | POA: Diagnosis not present

## 2023-01-25 DIAGNOSIS — J9601 Acute respiratory failure with hypoxia: Secondary | ICD-10-CM

## 2023-01-25 DIAGNOSIS — J9621 Acute and chronic respiratory failure with hypoxia: Secondary | ICD-10-CM | POA: Diagnosis not present

## 2023-01-25 DIAGNOSIS — I428 Other cardiomyopathies: Secondary | ICD-10-CM

## 2023-01-25 LAB — CULTURE, RESPIRATORY W GRAM STAIN

## 2023-01-25 LAB — RENAL FUNCTION PANEL
Albumin: 3.4 g/dL — ABNORMAL LOW (ref 3.5–5.0)
Anion gap: 6 (ref 5–15)
BUN: 14 mg/dL (ref 8–23)
CO2: 27 mmol/L (ref 22–32)
Calcium: 8.6 mg/dL — ABNORMAL LOW (ref 8.9–10.3)
Chloride: 106 mmol/L (ref 98–111)
Creatinine, Ser: 0.8 mg/dL (ref 0.61–1.24)
GFR, Estimated: >60 mL/min (ref >60)
Glucose, Bld: 110 mg/dL — ABNORMAL HIGH (ref 70–99)
Phosphorus: 3.1 mg/dL (ref 2.5–4.6)
Potassium: 3.5 mmol/L (ref 3.5–5.1)
Sodium: 139 mmol/L (ref 135–145)

## 2023-01-25 LAB — RESPIRATORY PANEL BY PCR

## 2023-01-25 LAB — GLUCOSE, CAPILLARY
Glucose-Capillary: 115 mg/dL — ABNORMAL HIGH (ref 70–99)
Glucose-Capillary: 131 mg/dL — ABNORMAL HIGH (ref 70–99)
Glucose-Capillary: 161 mg/dL — ABNORMAL HIGH (ref 70–99)
Glucose-Capillary: 136 mg/dL — ABNORMAL HIGH (ref 70–99)

## 2023-01-25 LAB — CBC
HCT: 37.5 % — ABNORMAL LOW (ref 39.0–52.0)
Hemoglobin: 11.9 g/dL — ABNORMAL LOW (ref 13.0–17.0)
MCH: 28.2 pg (ref 26.0–34.0)
MCHC: 31.7 g/dL (ref 30.0–36.0)
MCV: 88.9 fL (ref 80.0–100.0)
Platelets: 192 10*3/uL (ref 150–400)
RBC: 4.22 MIL/uL (ref 4.22–5.81)
RDW: 14.1 % (ref 11.5–15.5)
WBC: 4.8 10*3/uL (ref 4.0–10.5)
nRBC: 0 % (ref 0.0–0.2)

## 2023-01-25 LAB — MAGNESIUM: Magnesium: 2 mg/dL (ref 1.7–2.4)

## 2023-01-25 MED ORDER — OXYCODONE HCL 5 MG PO TABS
5.0000 mg | ORAL_TABLET | Freq: Three times a day (TID) | ORAL | Status: DC | PRN
  Administered 2023-01-25 – 2023-01-26 (×2): 5 mg via ORAL
  Filled 2023-01-25 (×2): qty 1

## 2023-01-25 MED ORDER — IPRATROPIUM-ALBUTEROL 0.5-2.5 (3) MG/3ML IN SOLN
3.0000 mL | Freq: Three times a day (TID) | RESPIRATORY_TRACT | Status: DC
  Administered 2023-01-25 – 2023-01-26 (×2): 3 mL via RESPIRATORY_TRACT
  Filled 2023-01-25 (×2): qty 3

## 2023-01-25 NOTE — Progress Notes (Signed)
 SATURATION QUALIFICATIONS: (This note is used to comply with regulatory documentation for home oxygen )  Patient Saturations on Room Air at Rest = 83%  Patient Saturations on Room Air while Ambulating = Not taken  Patient Saturations on 3 Liters of oxygen  while Ambulating = 96%  Please briefly explain why patient needs home oxygen : Patient wears Oxygen  at home. O2 sats drop to mid 80's when patient is not using Oxygen . Patient is visibly out of breath when not using Oxygen .

## 2023-01-25 NOTE — TOC Progression Note (Signed)
 Transition of Care [facility]) - Inpatient Brief Assessment  Patient Details  Name: Scott [name] MRN: [id number] Date of Birth: 1960/07/02  Transition of Care [facility]) CM/SW Contact:    Duwaine GORMAN Aran, LCSW Phone Number: 01/25/2023, 11:50 AM  Clinical Narrative: CSW spoke with patient and confirmed patient is on 3L/min home oxygen  PRN. Patient receives his home oxygen  through [employer]. Patient reported he brought his portable concentrator with him to the hospital, which he has in his room so no travel tank will need to be delivered to his room at discharge. No TOC needs identified.  Transition of Care Asessment: Insurance and Status: Insurance coverage has been reviewed Patient has primary care physician: Yes Home environment has been reviewed: Resides with spouse at home Prior level of function:: Independent at baseline Prior/Current Home Services: Current home services (3L/min home oxygen  through [employer]) Social Drivers of Health Review: SDOH reviewed no interventions necessary Readmission risk has been reviewed: Yes Transition of care needs: no transition of care needs at this time  Social Determinants of Health (SDOH) Interventions SDOH Screenings   Food Insecurity: No Food Insecurity (01/24/2023)  Housing: Low Risk  (01/24/2023)  Transportation Needs: No Transportation Needs (01/24/2023)  Utilities: Not At Risk (01/24/2023)  Depression (PHQ2-9): Medium Risk (07/14/2022)  Financial Resource Strain: Low Risk  (09/05/2022)   Received from [facility]  Physical Activity: Not on File (04/04/2022)   Received from [place], [place]  Social Connections: Not on File (10/10/2022)   Received from [facility]  Stress: Not on File (04/04/2022)   Received from [place], [place]  Tobacco Use: Medium Risk (01/23/2023)   Readmission Risk Interventions    01/25/2023   11:45 AM 11/22/2022    9:38 AM  Readmission Risk Prevention Plan  Transportation Screening Complete Complete  PCP or Specialist Appt within 3-5 Days  Complete   HRI or Home Care Consult Complete Complete  Social Work Consult for Recovery Care Planning/Counseling Complete Complete  Palliative Care Screening Not Applicable Not Applicable  Medication Review Oceanographer) Complete Complete

## 2023-01-25 NOTE — Progress Notes (Signed)
 PROGRESS NOTE  Scott [name] FMW:[id number] DOB: 08-16-1960   PCP: Florie Cohen, MD  Patient is from: Home.  Independently ambulates at baseline.  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  Symptoms started after he returned from his trip to [place] .  No sick contacts he can recall.  Patient was hypoxic to 88% on 4 L.  CXR with hyperinflation but no infiltrate.  COVID-19, influenza and RSV PCR nonreactive.  RVP and cultures ordered.  Admitted with working diagnosis of acute on chronic respiratory failure with hypoxia due to COPD exacerbation and possible pneumonia.  Received broad-spectrum antibiotics, steroid and breathing treatments in ED.  Admitted on steroid, nebulizers and Zithromax .    Subjective: Seen and examined earlier this morning.  No major events overnight of this morning.  Continues to endorse productive cough with white frothy sputum.  Feels his breathing is about the same as yesterday.  Also felt lightheaded when he got up this morning.  He was slightly hypotensive.   Objective: Vitals:   01/25/23 0612 01/25/23 0808 01/25/23 1053 01/25/23 1423  BP: (!) 107/53   117/79  Pulse: 72   76  Resp: 16   15  Temp: 98.6 F (37 C)   98.9 F (37.2 C)  TempSrc:    Oral  SpO2: 91% 95% (!) 84% 94%  Weight:      Height:        Examination:  GENERAL: No apparent distress.  Nontoxic. HEENT: MMM.  Vision and hearing grossly intact.  NECK: Supple.  No apparent JVD.  RESP:  No IWOB.  Rhonchi bilaterally. CVS:  RRR. Heart sounds normal.  ABD/GI/GU: BS+. Abd soft, NTND.  MSK/EXT:  Moves extremities. No apparent deformity. No edema.  SKIN: no apparent skin lesion or  wound NEURO: Awake, alert and oriented appropriately.  No apparent focal neuro deficit. PSYCH: Calm. Normal affect.   Procedures:  None  Microbiology summarized: COVID-19, influenza and RSV PCR nonreactive Blood cultures NGTD 20 pathogen RVP nonreactive. Respiratory culture with normal respiratory flora.  Assessment and plan: Acute on chronic respiratory failure with hypoxia due to COPD exacerbation: Unclear cause of his exacerbation but there might be sick contact from recent travel.  Also history of noncompliance with breathing treatments.  He stopped smoking in April 2024.  He is followed by Dr. Shelah outpatient.  CXR without acute finding but still with tachypnea and patient productive cough with greenish phlegm.  Cultures and RVP unrevealing.  Continues to endorse productive cough with white frothy phlegm and shortness of breath.  Anxious to go home although he maintained good saturation on home 3 L. -Continue steroid and Zithromax .   -Wean oxygen  to home 3 L.  Incentive spirometry and flutter valve. -Continue scheduled and as needed nebulizers -Continue Protonix   SIRS: Heart tachycardia and tachypnea on presentation.  No objective fever although he reported some subjective fever at home.  No leukocytosis.  Procalcitonin negative.  No infiltrate on chest x-ray.  Sepsis physiology resolved.  Pneumonia and sepsis ruled out. -Management as above    Chronic combined CHF with improved EF: TTE in 2020 with LVEF of 30 to 35% but improved to 60 to 65% in  2022.  Appears euvolemic on exam.  On p.o. Lasix  20 mg every other day. -Continue holding diuretics. -Hold Entresto  given symptomatic hypotension -Continue home Zebeta . -Monitor fluid and respiratory status  Hypotension: BP 107/53 this morning.  Felt lightheaded when he got up to go to the bathroom. -Hold home Entresto    NIDDM-2 with hyperglycemia: A1c 6.3% on 10/28.  Takes Janumet  at home. Recent Labs  Lab 01/24/23 1154  01/24/23 1717 01/24/23 2156 01/25/23 0816 01/25/23 1207  GLUCAP 134* 147* 136* 115* 131*  -Continue current insulin  regimen  Chronic back pain/neuropathy: Reports taking oxycodone  at home but last fill was on 11/13 only for 5 days.  He has upcoming appointment at pain clinic next week.  Does not want to take his Lyrica . -continue home baclofen . -Continue Tylenol  -Decrease oxycodone  to 5 mg every 8 hours as needed severe pain -Outpatient follow-up at pain clinic as previously planned.  Obesity Body mass index is 31.09 kg/m. -Encourage lifestyle change to lose weight         DVT prophylaxis:  enoxaparin  (LOVENOX ) injection 40 mg Start: 01/23/23 1200 SCDs Start: 01/23/23 0831  Code Status: Full code Family Communication: None at bedside Level of care: Med-Surg Status is: Inpatient The patient will remain inpatient because: Acute on chronic respiratory failure with hypoxia due to COPD exacerbation   Final disposition: Home Consultants:  None  35 minutes with more than 50% spent in reviewing records, counseling patient/family and coordinating care.   Sch Meds:  Scheduled Meds:  aspirin  EC  81 mg Oral Daily   baclofen   10 mg Oral BID   bisoprolol   5 mg Oral Daily   enoxaparin  (LOVENOX ) injection  40 mg Subcutaneous Q24H   fluticasone   2 spray Each Nare Daily   insulin  aspart  0-15 Units Subcutaneous TID WC   insulin  aspart  0-5 Units Subcutaneous QHS   ipratropium-albuterol   3 mL Nebulization TID   pantoprazole   40 mg Oral Daily   predniSONE   40 mg Oral Q breakfast   rosuvastatin   10 mg Oral QHS   venlafaxine  XR  75 mg Oral Daily   Continuous Infusions:  azithromycin  500 mg (01/25/23 0845)   PRN Meds:.acetaminophen  **OR** acetaminophen , albuterol , alum & mag hydroxide-simeth, ondansetron  **OR** ondansetron  (ZOFRAN ) IV, oxyCODONE , traZODone   Antimicrobials: Anti-infectives (From admission, onward)    Start     Dose/Rate Route Frequency Ordered Stop   01/23/23  0845  azithromycin  (ZITHROMAX ) 500 mg in sodium chloride  0.9 % 250 mL IVPB        500 mg 250 mL/hr over 60 Minutes Intravenous Every 24 hours 01/23/23 0832 01/28/23 0844   01/23/23 0315  aztreonam  (AZACTAM ) 2 g in sodium chloride  0.9 % 100 mL IVPB        2 g 200 mL/hr over 30 Minutes Intravenous  Once 01/23/23 0305 01/23/23 0734   01/23/23 0315  metroNIDAZOLE  (FLAGYL ) IVPB 500 mg        500 mg 100 mL/hr over 60 Minutes Intravenous  Once 01/23/23 0305 01/23/23 0540   01/23/23 0315  vancomycin  (VANCOCIN ) IVPB 1000 mg/200 mL premix        1,000 mg 200 mL/hr over 60 Minutes Intravenous  Once 01/23/23 0305 01/23/23 0734        I have personally reviewed the following labs and images: CBC: Recent Labs  Lab 01/23/23 0352 01/24/23 0452 01/25/23 0414  WBC 7.1 5.9 4.8  NEUTROABS 5.3  --   --   HGB 12.7* 12.0* 11.9*  HCT 39.7 37.2*  37.5*  MCV 88.4 88.4 88.9  PLT 180 174 192   BMP &GFR Recent Labs  Lab 01/23/23 0352 01/24/23 0452 01/25/23 0414  NA 133* 139 139  K 3.3* 3.9 3.5  CL 101 107 106  CO2 21* 26 27  GLUCOSE 119* 111* 110*  BUN 12 14 14   CREATININE 0.85 0.77 0.80  CALCIUM  8.3* 8.9 8.6*  MG  --   --  2.0  PHOS  --   --  3.1   Estimated Creatinine Clearance: 93.8 mL/min (by C-G formula based on SCr of 0.8 mg/dL). Liver & Pancreas: Recent Labs  Lab 01/23/23 0352 01/25/23 0414  AST 14*  --   ALT 9  --   ALKPHOS 63  --   BILITOT 1.6*  --   PROT 7.4  --   ALBUMIN  3.9 3.4*   No results for input(s): LIPASE, AMYLASE in the last 168 hours. No results for input(s): AMMONIA in the last 168 hours. Diabetic: No results for input(s): HGBA1C in the last 72 hours. Recent Labs  Lab 01/24/23 1154 01/24/23 1717 01/24/23 2156 01/25/23 0816 01/25/23 1207  GLUCAP 134* 147* 136* 115* 131*   Cardiac Enzymes: No results for input(s): CKTOTAL, CKMB, CKMBINDEX, TROPONINI in the last 168 hours. No results for input(s): PROBNP in the last 8760  hours. Coagulation Profile: Recent Labs  Lab 01/23/23 0352  INR 1.2   Thyroid  Function Tests: No results for input(s): TSH, T4TOTAL, FREET4, T3FREE, THYROIDAB in the last 72 hours. Lipid Profile: No results for input(s): CHOL, HDL, LDLCALC, TRIG, CHOLHDL, LDLDIRECT in the last 72 hours. Anemia Panel: No results for input(s): VITAMINB12, FOLATE, FERRITIN, TIBC, IRON, RETICCTPCT in the last 72 hours. Urine analysis:    Component Value Date/Time   COLORURINE YELLOW 01/23/2023 0305   APPEARANCEUR CLEAR 01/23/2023 0305   LABSPEC 1.030 01/23/2023 0305   PHURINE 5.0 01/23/2023 0305   GLUCOSEU NEGATIVE 01/23/2023 0305   HGBUR NEGATIVE 01/23/2023 0305   BILIRUBINUR NEGATIVE 01/23/2023 0305   KETONESUR NEGATIVE 01/23/2023 0305   PROTEINUR 100 (A) 01/23/2023 0305   NITRITE NEGATIVE 01/23/2023 0305   LEUKOCYTESUR NEGATIVE 01/23/2023 0305   Sepsis Labs: Invalid input(s): PROCALCITONIN, LACTICIDVEN  Microbiology: Recent Results (from the past 240 hours)  Resp panel by RT-PCR (RSV, Flu A&B, Covid) Anterior Nasal Swab     Status: None   Collection Time: 01/23/23  3:05 AM   Specimen: Anterior Nasal Swab  Result Value Ref Range Status   SARS Coronavirus 2 by RT PCR NEGATIVE NEGATIVE Final    Comment: (NOTE) SARS-CoV-2 target nucleic acids are NOT DETECTED.  The SARS-CoV-2 RNA is generally detectable in upper respiratory specimens during the acute phase of infection. The lowest concentration of SARS-CoV-2 viral copies this assay can detect is 138 copies/mL. A negative result does not preclude SARS-Cov-2 infection and should not be used as the sole basis for treatment or other patient management decisions. A negative result may occur with  improper specimen collection/handling, submission of specimen other than nasopharyngeal swab, presence of viral mutation(s) within the areas targeted by this assay, and inadequate number of viral copies(<138  copies/mL). A negative result must be combined with clinical observations, patient history, and epidemiological information. The expected result is Negative.  Fact Sheet for Patients:  bloggercourse.com  Fact Sheet for Healthcare Providers:  seriousbroker.it  This test is no t yet approved or cleared by the [place]  FDA and  has been authorized for detection and/or diagnosis of SARS-CoV-2 by FDA under an Emergency  Use Authorization (EUA). This EUA will remain  in effect (meaning this test can be used) for the duration of the COVID-19 declaration under Section 564(b)(1) of the Act, 21 U.S.C.section 360bbb-3(b)(1), unless the authorization is terminated  or revoked sooner.       Influenza A by PCR NEGATIVE NEGATIVE Final   Influenza B by PCR NEGATIVE NEGATIVE Final    Comment: (NOTE) The Xpert Xpress SARS-CoV-2/FLU/RSV plus assay is intended as an aid in the diagnosis of influenza from Nasopharyngeal swab specimens and should not be used as a sole basis for treatment. Nasal washings and aspirates are unacceptable for Xpert Xpress SARS-CoV-2/FLU/RSV testing.  Fact Sheet for Patients: bloggercourse.com  Fact Sheet for Healthcare Providers: seriousbroker.it  This test is not yet approved or cleared by the [place]  FDA and has been authorized for detection and/or diagnosis of SARS-CoV-2 by FDA under an Emergency Use Authorization (EUA). This EUA will remain in effect (meaning this test can be used) for the duration of the COVID-19 declaration under Section 564(b)(1) of the Act, 21 U.S.C. section 360bbb-3(b)(1), unless the authorization is terminated or revoked.     Resp Syncytial Virus by PCR NEGATIVE NEGATIVE Final    Comment: (NOTE) Fact Sheet for Patients: bloggercourse.com  Fact Sheet for Healthcare  Providers: seriousbroker.it  This test is not yet approved or cleared by the [place]  FDA and has been authorized for detection and/or diagnosis of SARS-CoV-2 by FDA under an Emergency Use Authorization (EUA). This EUA will remain in effect (meaning this test can be used) for the duration of the COVID-19 declaration under Section 564(b)(1) of the Act, 21 U.S.C. section 360bbb-3(b)(1), unless the authorization is terminated or revoked.  Performed at [facility], [address] [place], [place] [address]   Respiratory (~20 pathogens) panel by PCR     Status: None   Collection Time: 01/23/23  3:05 AM   Specimen: Nasopharyngeal Swab; Respiratory  Result Value Ref Range Status   Adenovirus NOT DETECTED NOT DETECTED Final   Coronavirus 229E NOT DETECTED NOT DETECTED Final    Comment: (NOTE) The Coronavirus on the Respiratory Panel, DOES NOT test for the novel  Coronavirus (2019 nCoV)    Coronavirus HKU1 NOT DETECTED NOT DETECTED Final   Coronavirus NL63 NOT DETECTED NOT DETECTED Final   Coronavirus OC43 NOT DETECTED NOT DETECTED Final   Metapneumovirus NOT DETECTED NOT DETECTED Final   Rhinovirus / Enterovirus NOT DETECTED NOT DETECTED Final   Influenza A NOT DETECTED NOT DETECTED Final   Influenza B NOT DETECTED NOT DETECTED Final   Parainfluenza Virus 1 NOT DETECTED NOT DETECTED Final   Parainfluenza Virus 2 NOT DETECTED NOT DETECTED Final   Parainfluenza Virus 3 NOT DETECTED NOT DETECTED Final   Parainfluenza Virus 4 NOT DETECTED NOT DETECTED Final   Respiratory Syncytial Virus NOT DETECTED NOT DETECTED Final   Bordetella pertussis NOT DETECTED NOT DETECTED Final   Bordetella Parapertussis NOT DETECTED NOT DETECTED Final   Chlamydophila pneumoniae NOT DETECTED NOT DETECTED Final   Mycoplasma pneumoniae NOT DETECTED NOT DETECTED Final    Comment: Performed at [facility], [address], [place], [place] [address]  Blood  Culture (routine x 2)     Status: None (Preliminary result)   Collection Time: 01/23/23  3:52 AM   Specimen: BLOOD RIGHT ARM  Result Value Ref Range Status   Specimen Description   Final    BLOOD RIGHT ARM Performed at [facility], [address]  [place], [place] [address]    Special Requests   Final    BOTTLES DRAWN AEROBIC AND ANAEROBIC Blood Culture results may not be optimal due to an inadequate volume of blood received in culture bottles Performed at [facility], [address], [place], [place] [address]    Culture   Final    NO GROWTH 2 DAYS Performed at [facility], [address], [place], [place] [address]    Report Status PENDING  Incomplete  Blood Culture (routine x 2)     Status: None (Preliminary result)   Collection Time: 01/23/23  3:52 AM   Specimen: BLOOD LEFT ARM  Result Value Ref Range Status   Specimen Description   Final    BLOOD LEFT ARM Performed at [facility], [address] [place], [place] [address]    Special Requests   Final    BOTTLES DRAWN AEROBIC AND ANAEROBIC Blood Culture results may not be optimal due to an inadequate volume of blood received in culture bottles Performed at [facility], [address], [place], [place] [address]    Culture   Final    NO GROWTH 2 DAYS Performed at [facility], [address] [place], [place] [address]    Report Status PENDING  Incomplete  Expectorated Sputum Assessment w Gram Stain, Rflx to Resp Cult     Status: None   Collection Time: 01/23/23 11:02 AM   Specimen: Sputum  Result Value Ref Range Status   Specimen Description SPUTUM  Final   Special Requests NONE  Final   Sputum evaluation   Final    THIS SPECIMEN IS ACCEPTABLE FOR SPUTUM CULTURE Performed at [facility], [address] [place], [place] [address]    Report Status 01/23/2023 FINAL  Final  Culture, Respiratory w Gram Stain      Status: None   Collection Time: 01/23/23 11:02 AM   Specimen: SPU  Result Value Ref Range Status   Specimen Description   Final    SPUTUM Performed at [facility], [address], [place], [place] [address]    Special Requests   Final    NONE Reflexed from [id number] Performed at [facility], [address] [place], [place] [address]    Gram Stain   Final    MODERATE WBC PRESENT,BOTH PMN AND MONONUCLEAR MODERATE GRAM POSITIVE COCCI FEW GRAM NEGATIVE RODS    Culture   Final    ABUNDANT Normal respiratory flora-no Staph aureus or Pseudomonas seen Performed at [facility], [address] [place], [place] [address]    Report Status 01/25/2023 FINAL  Final    Radiology Studies: No results found.    Mohd. Derflinger T. [facility]  If 7PM-7AM, please contact night-coverage [email or web address] 01/25/2023, 4:02 PM

## 2023-01-26 DIAGNOSIS — E119 Type 2 diabetes mellitus without complications: Secondary | ICD-10-CM

## 2023-01-26 DIAGNOSIS — J9601 Acute respiratory failure with hypoxia: Secondary | ICD-10-CM | POA: Diagnosis not present

## 2023-01-26 DIAGNOSIS — J441 Chronic obstructive pulmonary disease with (acute) exacerbation: Secondary | ICD-10-CM | POA: Diagnosis not present

## 2023-01-26 DIAGNOSIS — J189 Pneumonia, unspecified organism: Secondary | ICD-10-CM | POA: Diagnosis not present

## 2023-01-26 LAB — GLUCOSE, CAPILLARY: Glucose-Capillary: 125 mg/dL — ABNORMAL HIGH (ref 70–99)

## 2023-01-26 MED ORDER — JANUMET 50-500 MG PO TABS: 1.0000 | ORAL_TABLET | Freq: Every day | ORAL | 2 refills | Status: AC

## 2023-01-26 MED ORDER — PREDNISONE 20 MG PO TABS: 40.0000 mg | ORAL_TABLET | Freq: Every day | ORAL | 0 refills | Status: AC

## 2023-01-26 MED ORDER — AZITHROMYCIN 500 MG PO TABS
500.0000 mg | ORAL_TABLET | Freq: Every day | ORAL | 0 refills | Status: AC
Start: 2023-01-26 — End: 2023-01-28

## 2023-01-26 MED ORDER — OXYCODONE-ACETAMINOPHEN 10-325 MG PO TABS: 1.0000 | ORAL_TABLET | Freq: Three times a day (TID) | ORAL | 0 refills | Status: AC | PRN

## 2023-01-26 NOTE — Plan of Care (Signed)
  Problem: Education: Goal: Ability to describe self-care measures that may prevent or decrease complications (Diabetes Survival Skills Education) will improve Outcome: Progressing Goal: Individualized Educational Video(s) Outcome: Progressing   Problem: Coping: Goal: Ability to adjust to condition or change in health will improve Outcome: Progressing   Problem: Fluid Volume: Goal: Ability to maintain a balanced intake and output will improve Outcome: Progressing   Problem: Health Behavior/Discharge Planning: Goal: Ability to identify and utilize available resources and services will improve Outcome: Progressing Goal: Ability to manage health-related needs will improve Outcome: Progressing   Problem: Metabolic: Goal: Ability to maintain appropriate glucose levels will improve Outcome: Progressing   Problem: Nutritional: Goal: Maintenance of adequate nutrition will improve Outcome: Progressing Goal: Progress toward achieving an optimal weight will improve Outcome: Progressing   Problem: Skin Integrity: Goal: Risk for impaired skin integrity will decrease Outcome: Progressing   Problem: Tissue Perfusion: Goal: Adequacy of tissue perfusion will improve Outcome: Progressing   Problem: Education: Goal: Knowledge of General Education information will improve Description: Including pain rating scale, medication(s)/side effects and non-pharmacologic comfort measures Outcome: Progressing   Problem: Health Behavior/Discharge Planning: Goal: Ability to manage health-related needs will improve Outcome: Progressing   Problem: Clinical Measurements: Goal: Ability to maintain clinical measurements within normal limits will improve Outcome: Progressing Goal: Will remain free from infection Outcome: Progressing Goal: Diagnostic test results will improve Outcome: Progressing Goal: Respiratory complications will improve Outcome: Progressing Goal: Cardiovascular complication will  be avoided Outcome: Progressing   Problem: Activity: Goal: Risk for activity intolerance will decrease Outcome: Progressing   Problem: Nutrition: Goal: Adequate nutrition will be maintained Outcome: Progressing   Problem: Pain Management: Goal: General experience of comfort will improve Outcome: Progressing   Problem: Safety: Goal: Ability to remain free from injury will improve Outcome: Progressing   Problem: Skin Integrity: Goal: Risk for impaired skin integrity will decrease Outcome: Progressing

## 2023-01-26 NOTE — Discharge Summary (Signed)
 Physician Discharge Summary   Patient: Scott [name] MRN: [id number] DOB: November 13, 1960  Admit date:     01/23/2023  Discharge date: 01/26/23  Discharge Physician: Nydia Distance, MD    PCP: Florie Cohen, MD   Recommendations at discharge:   Continue Zithromax  for 2 more days, Continue prednisone  40 mg daily for 5 days  Discharge Diagnoses:   SIRS    CAP (community acquired pneumonia)   COPD with acute exacerbation (HCC)   Chronic pain syndrome/chronic back pain   Acute on chronic respiratory failure with hypoxia (HCC)   Non-ischemic cardiomyopathy (HCC) Obesity Chronic combined systolic and diastolic CHF  Hospital Course:  63 year old M with PMH of COPD, chronic hypoxic RF on 3 L, combined CHF/NICM with improved EF, NIDDM-2 and chronic back pain presenting with progressive shortness of breath and productive cough for about 5 days. Symptoms started after he returned from his trip to [place] . No sick contacts he can recall. Patient was hypoxic to 88% on 4 L. CXR with hyperinflation but no infiltrate. COVID-19, influenza and RSV PCR nonreactive. RVP and cultures ordered. Admitted with working diagnosis of acute on chronic respiratory failure with hypoxia due to COPD exacerbation and possible pneumonia. Received broad-spectrum antibiotics, steroid and breathing treatments in ED. Admitted on steroid, nebulizers and Zithromax .   Assessment and Plan:  Acute on chronic respiratory failure with hypoxia due to COPD exacerbation: Unclear cause of his exacerbation but there might be sick contact from recent travel.  Also history of noncompliance with breathing treatments.  He stopped smoking in April 2024.  He is followed by Dr. Shelah outpatient. -Chest x-ray with no acute findings, RSV negative, flu negative, COVID-negative  -Outpatient on 3 L O2 via [place], currently at baseline, wheezing improved  -Continue prednisone  40 mg daily for 5 days, finish course of Zithromax  for 2 more  days -Continue outpatient bronchodilator regimen and follow-up with Dr. Shelah   SIRS: Heart tachycardia and tachypnea on presentation.   -Procalcitonin negative, no infiltrates on chest x-ray, sepsis ruled out     Chronic combined CHF with improved EF: TTE in 2020 with LVEF of 30 to 35% but improved to 60 to 65% in 2022.  Appears euvolemic on exam.  On p.o. Lasix  20 mg every other day. -Inpatient, diuretics and Entresto  were held due to borderline BP -Patient may resume cardiac medications and follow-up outpatient with cardiology.  BP now stable 113/72 at discharge.    NIDDM-2 with hyperglycemia: - A1c 6.3% on 10/28.  Takes Janumet  at home. Placed on insulin  regimen while inpatient    Chronic back pain/neuropathy: Reports taking oxycodone  at home but last fill was on 11/13 only for 5 days.  He has upcoming appointment at pain clinic next week.  Does not want to take his Lyrica . -continue home baclofen , outpatient oxycodone  as needed. -Outpatient follow-up at pain clinic as previously planned.   Obesity Body mass index is 31.09 kg/m. -Encourage lifestyle change to lose weight      Pain control -   Controlled Substance Reporting System database was reviewed. and patient was instructed, not to drive, operate heavy machinery, perform activities at heights, swimming or participation in water activities or provide baby-sitting services while on Pain, Sleep and Anxiety Medications; until their outpatient Physician has advised to do so again. Also recommended to not to take more than prescribed Pain, Sleep and Anxiety Medications.  Consultants: None Procedures performed: None Disposition: Home Diet recommendation:  Discharge Diet Orders (From admission, onward)  Start     Ordered   01/26/23 0000  Diet Carb Modified        01/26/23 0848            DISCHARGE MEDICATION: Allergies as of 01/26/2023       Reactions   Penicillins Anaphylaxis   Cymbalta  [duloxetine   Hcl] Other (See Comments)   crazy thoughts        Medication List     TAKE these medications    aspirin  EC 81 MG tablet Take 81 mg by mouth daily.   azithromycin  500 MG tablet Commonly known as: Zithromax  Take 1 tablet (500 mg total) by mouth daily for 2 days.   baclofen  10 MG tablet Commonly known as: LIORESAL  Take 10 mg by mouth 2 (two) times daily.   bisoprolol  5 MG tablet Commonly known as: ZEBETA  Take 1 tablet (5 mg total) by mouth daily.   Combivent  Respimat 20-100 MCG/ACT Aers respimat Generic drug: Ipratropium-Albuterol  INHALE 1 PUFF BY MOUTH FOUR TIMES DAILY   ipratropium-albuterol  0.5-2.5 (3) MG/3ML Soln Commonly known as: DUONEB Inhale 1 vial via nebulizer every 6 hours as needed   fluticasone  50 MCG/ACT nasal spray Commonly known as: FLONASE  USE 2 SPRAYS NASALLY EVERY DAY   fluticasone -salmeterol 250-50 MCG/ACT Aepb Commonly known as: ADVAIR  Inhale 1 puff into the lungs every 12 (twelve) hours.   furosemide  20 MG tablet Commonly known as: LASIX  Take 1 tablet (20 mg total) by mouth every other day. Take one half of a tablet every other day.   gabapentin  300 MG capsule Commonly known as: NEURONTIN  TAKE 1 CAPSULE BY MOUTH 3 TIMES DAILY   Janumet  50-500 MG tablet Generic drug: sitaGLIPtin -metformin  Take 1 tablet by mouth daily.   Narcan 4 MG/0.1ML Liqd nasal spray kit Generic drug: naloxone Place 1 spray into the nose daily as needed (For overdose).   omeprazole  40 MG capsule Commonly known as: PRILOSEC TAKE 1 CAPSULE BY MOUTH EVERY DAY What changed:  how much to take additional instructions   oxyCODONE -acetaminophen  10-325 MG tablet Commonly known as: PERCOCET Take 1 tablet by mouth every 8 (eight) hours as needed for pain.   OXYGEN  Inhale into the lungs.   predniSONE  20 MG tablet Commonly known as: DELTASONE  Take 2 tablets (40 mg total) by mouth daily with breakfast for 5 days. Start taking on: January 27, 2023   pregabalin  50 MG  capsule Commonly known as: LYRICA  Take 50 mg by mouth 3 (three) times daily.   rosuvastatin  10 MG tablet Commonly known as: CRESTOR  Take 1 tablet (10 mg total) by mouth daily.   sacubitril -valsartan  97-103 MG Commonly known as: ENTRESTO  Take 1 tablet by mouth 2 (two) times daily.   sildenafil  50 MG tablet Commonly known as: Viagra  Take 1 tablet (50 mg total) by mouth daily as needed for erectile dysfunction.   Ventolin  HFA 108 (90 Base) MCG/ACT inhaler Generic drug: albuterol  INHALE 1 TO 2 PUFFS BY MOUTH EVERY 6 HOURS AS NEEDED FOR WHEEZING OR SHORTNESS OF BREATH   Vitamin D  (Ergocalciferol ) 1.25 MG (50000 UNIT) Caps capsule Commonly known as: DRISDOL  TAKE ONE CAPSULE BY MOUTH ONCE WEEKLY What changed:  when to take this additional instructions        Follow-up Information     Florie Cohen, MD. Schedule an appointment as soon as possible for a visit in 2 week(s).   Specialty: Internal Medicine Why: for hospital follow-up Contact information: [address] [place] [address] [phone]  Discharge Exam: Filed Weights   01/24/23 1204  Weight: 82.1 kg   S: Feeling a lot better today, hoping to go home.  No fevers or chills or coughing, no wheezing  BP 113/72 (BP Location: Right Arm)   Pulse (!) 59   Temp 98.3 F (36.8 C) (Oral)   Resp 18   Ht 5' 4 (1.626 m)   Wt 82.1 kg   SpO2 92%   BMI 31.09 kg/m   Physical Exam General: Alert and oriented x 3, NAD Cardiovascular: S1 S2 clear, RRR.  Respiratory: CTAB, no wheezing, rales or rhonchi Gastrointestinal: Soft, nontender, nondistended, NBS Ext: no pedal edema bilaterally Neuro: no new deficits Skin: No rashes Psych: Normal affect    Condition at discharge: fair  The results of significant diagnostics from this hospitalization (including imaging, microbiology, ancillary and laboratory) are listed below for reference.   Imaging Studies: DG Chest 2 View Result Date:  01/23/2023 CLINICAL DATA:  Shortness of breath.  History of COPD and emphysema. EXAM: CHEST - 2 VIEW COMPARISON:  11/19/2022 FINDINGS: Chronic hyperinflation. Chronic but increased bronchial thickening. No focal airspace disease. Stable heart size and mediastinal contours. No pleural effusion or pneumothorax. Lower cervical hardware partially included. IMPRESSION: Chronic hyperinflation. Chronic but increased bronchial thickening which may represent COPD exacerbation. No focal airspace disease. Electronically Signed   By: Andrea Gasman M.D.   On: 01/23/2023 04:31    Microbiology: Results for orders placed or performed during the hospital encounter of 01/23/23  Resp panel by RT-PCR (RSV, Flu A&B, Covid) Anterior Nasal Swab     Status: None   Collection Time: 01/23/23  3:05 AM   Specimen: Anterior Nasal Swab  Result Value Ref Range Status   SARS Coronavirus 2 by RT PCR NEGATIVE NEGATIVE Final    Comment: (NOTE) SARS-CoV-2 target nucleic acids are NOT DETECTED.  The SARS-CoV-2 RNA is generally detectable in upper respiratory specimens during the acute phase of infection. The lowest concentration of SARS-CoV-2 viral copies this assay can detect is 138 copies/mL. A negative result does not preclude SARS-Cov-2 infection and should not be used as the sole basis for treatment or other patient management decisions. A negative result may occur with  improper specimen collection/handling, submission of specimen other than nasopharyngeal swab, presence of viral mutation(s) within the areas targeted by this assay, and inadequate number of viral copies(<138 copies/mL). A negative result must be combined with clinical observations, patient history, and epidemiological information. The expected result is Negative.  Fact Sheet for Patients:  bloggercourse.com  Fact Sheet for Healthcare Providers:  seriousbroker.it  This test is no t yet approved or  cleared by the [place]  FDA and  has been authorized for detection and/or diagnosis of SARS-CoV-2 by FDA under an Emergency Use Authorization (EUA). This EUA will remain  in effect (meaning this test can be used) for the duration of the COVID-19 declaration under Section 564(b)(1) of the Act, 21 U.S.C.section 360bbb-3(b)(1), unless the authorization is terminated  or revoked sooner.       Influenza A by PCR NEGATIVE NEGATIVE Final   Influenza B by PCR NEGATIVE NEGATIVE Final    Comment: (NOTE) The Xpert Xpress SARS-CoV-2/FLU/RSV plus assay is intended as an aid in the diagnosis of influenza from Nasopharyngeal swab specimens and should not be used as a sole basis for treatment. Nasal washings and aspirates are unacceptable for Xpert Xpress SARS-CoV-2/FLU/RSV testing.  Fact Sheet for Patients: bloggercourse.com  Fact Sheet for Healthcare Providers: seriousbroker.it  This test is  not yet approved or cleared by the [place]  FDA and has been authorized for detection and/or diagnosis of SARS-CoV-2 by FDA under an Emergency Use Authorization (EUA). This EUA will remain in effect (meaning this test can be used) for the duration of the COVID-19 declaration under Section 564(b)(1) of the Act, 21 U.S.C. section 360bbb-3(b)(1), unless the authorization is terminated or revoked.     Resp Syncytial Virus by PCR NEGATIVE NEGATIVE Final    Comment: (NOTE) Fact Sheet for Patients: bloggercourse.com  Fact Sheet for Healthcare Providers: seriousbroker.it  This test is not yet approved or cleared by the [place]  FDA and has been authorized for detection and/or diagnosis of SARS-CoV-2 by FDA under an Emergency Use Authorization (EUA). This EUA will remain in effect (meaning this test can be used) for the duration of the COVID-19 declaration under Section 564(b)(1) of the Act, 21  U.S.C. section 360bbb-3(b)(1), unless the authorization is terminated or revoked.  Performed at [facility], [address] [place], [place] [address]   Respiratory (~20 pathogens) panel by PCR     Status: None   Collection Time: 01/23/23  3:05 AM   Specimen: Nasopharyngeal Swab; Respiratory  Result Value Ref Range Status   Adenovirus NOT DETECTED NOT DETECTED Final   Coronavirus 229E NOT DETECTED NOT DETECTED Final    Comment: (NOTE) The Coronavirus on the Respiratory Panel, DOES NOT test for the novel  Coronavirus (2019 nCoV)    Coronavirus HKU1 NOT DETECTED NOT DETECTED Final   Coronavirus NL63 NOT DETECTED NOT DETECTED Final   Coronavirus OC43 NOT DETECTED NOT DETECTED Final   Metapneumovirus NOT DETECTED NOT DETECTED Final   Rhinovirus / Enterovirus NOT DETECTED NOT DETECTED Final   Influenza A NOT DETECTED NOT DETECTED Final   Influenza B NOT DETECTED NOT DETECTED Final   Parainfluenza Virus 1 NOT DETECTED NOT DETECTED Final   Parainfluenza Virus 2 NOT DETECTED NOT DETECTED Final   Parainfluenza Virus 3 NOT DETECTED NOT DETECTED Final   Parainfluenza Virus 4 NOT DETECTED NOT DETECTED Final   Respiratory Syncytial Virus NOT DETECTED NOT DETECTED Final   Bordetella pertussis NOT DETECTED NOT DETECTED Final   Bordetella Parapertussis NOT DETECTED NOT DETECTED Final   Chlamydophila pneumoniae NOT DETECTED NOT DETECTED Final   Mycoplasma pneumoniae NOT DETECTED NOT DETECTED Final    Comment: Performed at [facility], [address] [place], [place] [address]  Blood Culture (routine x 2)     Status: None (Preliminary result)   Collection Time: 01/23/23  3:52 AM   Specimen: BLOOD RIGHT ARM  Result Value Ref Range Status   Specimen Description   Final    BLOOD RIGHT ARM Performed at [facility], [address] [place], [place] [address]    Special Requests   Final    BOTTLES DRAWN AEROBIC AND ANAEROBIC Blood Culture results  may not be optimal due to an inadequate volume of blood received in culture bottles Performed at [facility], [address], [place], [place] [address]    Culture   Final    NO GROWTH 3 DAYS Performed at [facility], [address] [place], [place] [address]    Report Status PENDING  Incomplete  Blood Culture (routine x 2)     Status: None (Preliminary result)   Collection Time: 01/23/23  3:52 AM   Specimen: BLOOD LEFT ARM  Result Value Ref Range Status   Specimen Description   Final  BLOOD LEFT ARM Performed at [facility], [address] [place], [place] [address]    Special Requests   Final    BOTTLES DRAWN AEROBIC AND ANAEROBIC Blood Culture results may not be optimal due to an inadequate volume of blood received in culture bottles Performed at [facility], [address] [place], [place] [address]    Culture   Final    NO GROWTH 3 DAYS Performed at [facility], [address] [place], [place] [address]    Report Status PENDING  Incomplete  Expectorated Sputum Assessment w Gram Stain, Rflx to Resp Cult     Status: None   Collection Time: 01/23/23 11:02 AM   Specimen: Sputum  Result Value Ref Range Status   Specimen Description SPUTUM  Final   Special Requests NONE  Final   Sputum evaluation   Final    THIS SPECIMEN IS ACCEPTABLE FOR SPUTUM CULTURE Performed at [facility], [address], [place], [place] [address]    Report Status 01/23/2023 FINAL  Final  Culture, Respiratory w Gram Stain     Status: None   Collection Time: 01/23/23 11:02 AM   Specimen: SPU  Result Value Ref Range Status   Specimen Description   Final    SPUTUM Performed at [facility], [address] [place], [place] [address]    Special Requests   Final    NONE Reflexed from [id number] Performed at [facility], [address] [place], [place] [address]     Gram Stain   Final    MODERATE WBC PRESENT,BOTH PMN AND MONONUCLEAR MODERATE GRAM POSITIVE COCCI FEW GRAM NEGATIVE RODS    Culture   Final    ABUNDANT Normal respiratory flora-no Staph aureus or Pseudomonas seen Performed at [facility], [address] [place], [place] [address]    Report Status 01/25/2023 FINAL  Final    Labs: CBC: Recent Labs  Lab 01/23/23 0352 01/24/23 0452 01/25/23 0414  WBC 7.1 5.9 4.8  NEUTROABS 5.3  --   --   HGB 12.7* 12.0* 11.9*  HCT 39.7 37.2* 37.5*  MCV 88.4 88.4 88.9  PLT 180 174 192   Basic Metabolic Panel: Recent Labs  Lab 01/23/23 0352 01/24/23 0452 01/25/23 0414  NA 133* 139 139  K 3.3* 3.9 3.5  CL 101 107 106  CO2 21* 26 27  GLUCOSE 119* 111* 110*  BUN 12 14 14   CREATININE 0.85 0.77 0.80  CALCIUM  8.3* 8.9 8.6*  MG  --   --  2.0  PHOS  --   --  3.1   Liver Function Tests: Recent Labs  Lab 01/23/23 0352 01/25/23 0414  AST 14*  --   ALT 9  --   ALKPHOS 63  --   BILITOT 1.6*  --   PROT 7.4  --   ALBUMIN  3.9 3.4*   CBG: Recent Labs  Lab 01/25/23 0816 01/25/23 1207 01/25/23 1702 01/25/23 2211 01/26/23 0749  GLUCAP 115* 131* 161* 136* 125*    Discharge time spent: greater than 30 minutes.  Signed: Nydia Distance, MD [facility] 01/26/2023

## 2023-01-28 LAB — CULTURE, BLOOD (ROUTINE X 2)
Culture: NO GROWTH
Culture: NO GROWTH

## 2023-02-05 ENCOUNTER — Other Ambulatory Visit: Payer: Self-pay | Admitting: Emergency Medicine

## 2023-02-05 DIAGNOSIS — J449 Chronic obstructive pulmonary disease, unspecified: Secondary | ICD-10-CM

## 2023-02-06 ENCOUNTER — Other Ambulatory Visit: Payer: Self-pay

## 2023-02-06 DIAGNOSIS — I428 Other cardiomyopathies: Secondary | ICD-10-CM

## 2023-02-06 MED ORDER — SACUBITRIL-VALSARTAN 97-103 MG PO TABS: 1.0000 | ORAL_TABLET | Freq: Two times a day (BID) | ORAL | 3 refills | Status: DC

## 2023-03-02 ENCOUNTER — Other Ambulatory Visit: Payer: Self-pay | Admitting: Emergency Medicine

## 2023-03-02 DIAGNOSIS — J449 Chronic obstructive pulmonary disease, unspecified: Secondary | ICD-10-CM

## 2023-03-13 ENCOUNTER — Encounter: Payer: Self-pay | Admitting: Acute Care

## 2023-03-28 ENCOUNTER — Ambulatory Visit (INDEPENDENT_AMBULATORY_CARE_PROVIDER_SITE_OTHER): Payer: Medicare HMO | Admitting: Emergency Medicine

## 2023-03-28 ENCOUNTER — Encounter: Payer: Self-pay | Admitting: Emergency Medicine

## 2023-03-28 VITALS — BP 112/64 | HR 86 | Ht 68.0 in | Wt 176.2 lb

## 2023-03-28 DIAGNOSIS — F17200 Nicotine dependence, unspecified, uncomplicated: Secondary | ICD-10-CM

## 2023-03-28 DIAGNOSIS — J441 Chronic obstructive pulmonary disease with (acute) exacerbation: Secondary | ICD-10-CM

## 2023-03-28 DIAGNOSIS — J9611 Chronic respiratory failure with hypoxia: Secondary | ICD-10-CM | POA: Diagnosis not present

## 2023-03-28 MED ORDER — BUPROPION HCL ER (SR) 150 MG PO TB12: ORAL_TABLET | ORAL | 2 refills | Status: AC

## 2023-03-28 NOTE — Patient Instructions (Addendum)
 Get your lung cancer screening CT scan later this month as planned Continue your Wixela (generic Advair) twice a day.  Rinse and gargle after using in order to avoid thrush Keep your DuoNeb and your Combivent available to use up to every 6 hours if needed for shortness of breath, chest tightness, wheezing. Continue your oxygen at all times as you have been using it. I am glad that you want to try to stop smoking again.  We will send a prescription for bupropion (Wellbutrin).  You start this medication 1 week before your quit date. Follow-up with APP in 3 months Follow with Dr Delton Coombes in 6 months or sooner if you have any problems

## 2023-03-28 NOTE — Progress Notes (Unsigned)
  Subjective:    Patient ID: Scott [name], male    DOB: [birth year], 63 y.o.   MRN: [id number]  HPI  ROV 03/28/2023 --follow-up visit 63 year old man with history of severe COPD and associated chronic hypoxemic respiratory failure.  He has chronic cough and mucus production.  Also with a history of chronic pain that impacts his back, legs.  PMH: Nonischemic cardiomyopathy/CHF, diabetes He describes some pain on inspiration as well.  He was in the hospital at the end of December, treated for possible community-acquired pneumonia/COPD exacerbation.  He is scheduled for his lung cancer screening CT chest later this month. Currently managed on Advair, DuoNeb or combivent prn. He is not using the Wixela reliably because he has had trouble w thrush.  Today he reports that he is better, still quite limited w exertion. He is wearing his O2 reliably. No cough. He has pain in his R mid back. He is smoking about 3-4 cig a day. He is interested in stopping smoking, wellbutrin.         No data to display          Objective:   Physical Exam  Vitals:   03/28/23 1349  BP: 112/64  Pulse: 86  SpO2: 90%  Weight: 176 lb 3.2 oz (79.9 kg)  Height: 5\' 8"  (1.727 m)    Gen: Pleasant, well-nourished, in no distress  ENT: No lesions,  mouth clear,  oropharynx clear, no postnasal drip,   Neck: No JVD, no stridor  Lungs: mostly clear, overall clear, few end expiratory wheezes on forced expiration  Cardiovascular: RRR, heart sounds normal, no murmur or gallops, trace peripheral edema  Musculoskeletal: No deformities, no cyanosis or clubbing  Neuro: alert, appropriate, non-focal  Skin: Warm     Assessment & Plan:  Tobacco use disorder Get your lung cancer screening CT scan later this month as planned I am glad that you want to try to stop smoking again.  We will send a prescription for bupropion (Wellbutrin).  You start this medication 1 week before your quit date. Follow-up with APP in 3  months Follow with Dr Delton Coombes in 6 months or sooner if you have any problems  COPD (chronic obstructive pulmonary disease) Continue your Wixela (generic Advair) twice a day.  Rinse and gargle after using in order to avoid thrush Keep your DuoNeb and your Combivent available to use up to every 6 hours if needed for shortness of breath, chest tightness, wheezing.  Chronic respiratory failure with hypoxia (HCC) Continue your oxygen at all times as you have been using it.      Levy Pupa, MD, PhD 03/29/2023, 12:27 PM [facility] [phone] or if no answer [phone]

## 2023-03-29 NOTE — Assessment & Plan Note (Signed)
Continue your oxygen at all times as you have been using it. 

## 2023-03-29 NOTE — Assessment & Plan Note (Signed)
 Get your lung cancer screening CT scan later this month as planned I am glad that you want to try to stop smoking again.  We will send a prescription for bupropion (Wellbutrin).  You start this medication 1 week before your quit date. Follow-up with APP in 3 months Follow with Dr Delton Coombes in 6 months or sooner if you have any problems

## 2023-03-29 NOTE — Assessment & Plan Note (Signed)
 Continue your Wixela (generic Advair) twice a day.  Rinse and gargle after using in order to avoid thrush Keep your DuoNeb and your Combivent available to use up to every 6 hours if needed for shortness of breath, chest tightness, wheezing.

## 2023-04-11 ENCOUNTER — Ambulatory Visit
Admission: RE | Admit: 2023-04-11 | Discharge: 2023-04-11 | Disposition: A | Discharge status: Home / Self Care | Payer: Medicare HMO | Source: Ambulatory Visit | Attending: Emergency Medicine | Admitting: Emergency Medicine

## 2023-04-11 DIAGNOSIS — Z87891 Personal history of nicotine dependence: Secondary | ICD-10-CM

## 2023-04-13 ENCOUNTER — Telehealth: Payer: Self-pay | Admitting: Acute Care

## 2023-04-13 NOTE — Telephone Encounter (Signed)
 When results become available, patient would like to know how his CT Lung Cancer Screening went.

## 2023-04-16 NOTE — Telephone Encounter (Signed)
 Noted, message sent to mychart.

## 2023-05-21 ENCOUNTER — Other Ambulatory Visit: Payer: Self-pay | Admitting: Acute Care

## 2023-05-21 DIAGNOSIS — Z122 Encounter for screening for malignant neoplasm of respiratory organs: Secondary | ICD-10-CM

## 2023-05-21 DIAGNOSIS — Z87891 Personal history of nicotine dependence: Secondary | ICD-10-CM

## 2023-05-22 ENCOUNTER — Other Ambulatory Visit: Payer: Self-pay | Admitting: Adult Health

## 2023-06-20 ENCOUNTER — Other Ambulatory Visit: Payer: Self-pay | Admitting: Emergency Medicine

## 2023-07-02 ENCOUNTER — Encounter: Payer: Self-pay | Admitting: Adult Health

## 2023-07-02 ENCOUNTER — Ambulatory Visit (INDEPENDENT_AMBULATORY_CARE_PROVIDER_SITE_OTHER): Admitting: Adult Health

## 2023-07-02 VITALS — BP 106/64 | HR 71 | Temp 98.4°F | Ht 68.0 in | Wt 165.2 lb

## 2023-07-02 DIAGNOSIS — Z87891 Personal history of nicotine dependence: Secondary | ICD-10-CM | POA: Diagnosis not present

## 2023-07-02 DIAGNOSIS — J449 Chronic obstructive pulmonary disease, unspecified: Secondary | ICD-10-CM | POA: Diagnosis not present

## 2023-07-02 DIAGNOSIS — F17200 Nicotine dependence, unspecified, uncomplicated: Secondary | ICD-10-CM

## 2023-07-02 DIAGNOSIS — J9611 Chronic respiratory failure with hypoxia: Secondary | ICD-10-CM

## 2023-07-02 MED ORDER — VENTOLIN HFA 108 (90 BASE) MCG/ACT IN AERS: INHALATION_SPRAY | RESPIRATORY_TRACT | 5 refills | Status: DC

## 2023-07-02 MED ORDER — COMBIVENT RESPIMAT 20-100 MCG/ACT IN AERS: INHALATION_SPRAY | RESPIRATORY_TRACT | 11 refills | Status: DC

## 2023-07-02 MED ORDER — FLUTICASONE-SALMETEROL 250-50 MCG/ACT IN AEPB: 1.0000 | INHALATION_SPRAY | Freq: Two times a day (BID) | RESPIRATORY_TRACT | 11 refills | Status: AC

## 2023-07-02 MED ORDER — IPRATROPIUM-ALBUTEROL 0.5-2.5 (3) MG/3ML IN SOLN: RESPIRATORY_TRACT | 5 refills | Status: DC

## 2023-07-02 NOTE — Assessment & Plan Note (Signed)
 Continue on Oxygen  to keep O2 sats >88-90%  Plan  Patient Instructions  Advair  250 1 puff Twice daily, rinse after use.  Continue on Combivent  1 puff 4 times daily Continue on Oxygen  3l/m with activity.  Albuterol  inhaler As needed   Activity as tolerated.  Continue with yearly CT chest screening  Saline nasal spray As needed   Saline nasal gel At bedtime   Follow up with Dr. Baldwin Levee  in 3-4  months and As needed  Please contact office for sooner follow up if symptoms do not improve or worsen or seek emergency care

## 2023-07-02 NOTE — Assessment & Plan Note (Signed)
 Continue with smoking cessation.  Continue with yearly CT chest screening program.

## 2023-07-02 NOTE — Assessment & Plan Note (Signed)
 Appears stable -continue on current regimen  Refills sent   Plan  Patient Instructions  Advair  250 1 puff Twice daily, rinse after use.  Continue on Combivent  1 puff 4 times daily Continue on Oxygen  3l/m with activity.  Albuterol  inhaler As needed   Activity as tolerated.  Continue with yearly CT chest screening  Saline nasal spray As needed   Saline nasal gel At bedtime   Follow up with Dr. Baldwin Levee  in 3-4  months and As needed  Please contact office for sooner follow up if symptoms do not improve or worsen or seek emergency care

## 2023-07-02 NOTE — Progress Notes (Signed)
 @Patient  ID: Scott [name], male    DOB: [birth year], 63 y.o.   MRN: [id number]  Chief Complaint  Patient presents with   Follow-up    Referring provider: Everlyn Hockey, MD  HPI: 63 year old male former smoker quit 2024 followed for severe COPD and chronic respiratory failure-on home oxygen  Medical history significant for chronic systolic heart failure, nonischemic cardiomyopathy, diabetes Participates in the lung cancer CT chest screening program.  TEST/EVENTS :  2D echo December 2020 EF 30 to 35%, severely decreased function, significant left ventricular septal wall thickening, grade 1 diastolic dysfunction   June 29, 2019 CT head no acute intracranial pathology   June 29, 2019 CTA PE protocol negative for PE.,  Positive for emphysema.  Newly scattered irregular nodular opacities in the lungs largest measuring 1.2 cm likely infectious/inflammatory.   PFTs October 2018 showed FEV1 at 45%, ratio 45, FVC 78%, positive bronchodilator response, severe mid flow airflow obstruction with reversibility, DLCO 61%   CT scan of the chest 04/10/2022 reviewed by me, shows moderate centrilobular and paraseptal emphysema, scattered pulmonary nodules that are unchanged, largest 3.4 mm. Lung RADS 2 study.   07/02/2023 Follow up ; COPD, O2 RF  Patient presents for 53-month follow-up.  patient has underlying severe COPD.  He remains on Advair  twice daily.  Uses Combivent  4 times a day.  Says overall breathing is doing well.  He continues to not smoke.  Remains on oxygen  3 L with activity. Participates in the lung cancer CT chest screening program.  CT chest April 11, 2023 showed severe emphysema, stable pulmonary nodules.  Considered a lung RADS 2. Remains active with light household chores, walks each day short distances.   Allergies  Allergen Reactions   Penicillins Anaphylaxis   Cymbalta  [Duloxetine  Hcl] Other (See Comments)    "crazy thoughts"    Immunization History  Administered Date(s)  Administered   Influenza Split 11/24/2010, 12/11/2011   Influenza Whole 12/22/2019   Influenza,inj,Quad PF,6+ Mos 12/11/2014, 10/05/2017   Influenza-Unspecified 11/06/2018   PFIZER(Purple Top)SARS-COV-2 Vaccination 04/25/2019, 05/19/2019, 02/11/2020   Pneumococcal Polysaccharide-23 09/25/2017    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   COPD (chronic obstructive pulmonary disease) (HCC)    Diabetes mellitus without complication (HCC)    GERD (gastroesophageal reflux disease)    Headache(784.0)    Heart attack (HCC)    Pneumonia    hx    Tobacco History: Social History   Tobacco Use  Smoking Status Former   Current packs/day: 0.00   Average packs/day: 0.2 packs/day for 47.0 years (7.0 ttl pk-yrs)   Types: Cigarettes   Start date: 01/23/1973   Quit date: 12/03/2019   Years since quitting: 3.5  Smokeless Tobacco Never  Tobacco Comments   07/02/2023:  quit smoking on 4/1/.2025 hfb RN   Counseling given: Not Answered Tobacco comments: 07/02/2023:  quit smoking on 4/1/.2025 hfb RN   Outpatient Medications Prior to Visit  Medication Sig Dispense Refill   aspirin  EC 81 MG tablet Take 81 mg by mouth daily.     baclofen  (LIORESAL ) 10 MG tablet Take 10 mg by mouth 2 (two) times daily.     bisoprolol  (ZEBETA ) 5 MG tablet Take 1 tablet (5 mg total) by mouth daily. 90 tablet 3   buPROPion  (WELLBUTRIN  SR) 150 MG 12 hr tablet Take 1 pill daily for 3 days, then increase to 1 pill twice a day thereafter.  Start this  medication 1 week prior to your cigarette quit date 60 tablet 2   fluticasone  (FLONASE ) 50 MCG/ACT nasal spray USE 2 SPRAYS NASALLY EVERY DAY 48 g 3   furosemide  (LASIX ) 20 MG tablet Take 1 tablet (20 mg total) by mouth every other day. Take one half of a tablet every other day. 45 tablet 3   NARCAN 4 MG/0.1ML LIQD nasal spray kit Place 1 spray into the nose daily as needed (For overdose).      omeprazole  (PRILOSEC) 40 MG  capsule TAKE 1 CAPSULE BY MOUTH EVERY DAY (Patient taking differently: Take 40 mg by mouth daily. TAKE 1 CAPSULE BY MOUTH EVERY DAY) 30 capsule 0   oxyCODONE -acetaminophen  (PERCOCET) 10-325 MG tablet Take 1 tablet by mouth every 8 (eight) hours as needed for pain. (Patient taking differently: Take 1 tablet by mouth every 4 (four) hours as needed for pain. No more than 5 pills/day) 15 tablet 0   OXYGEN  Inhale into the lungs.     pregabalin  (LYRICA ) 50 MG capsule Take 50 mg by mouth 3 (three) times daily. As needed     rosuvastatin  (CRESTOR ) 10 MG tablet Take 1 tablet (10 mg total) by mouth daily. 90 tablet 3   sacubitril -valsartan  (ENTRESTO ) 97-103 MG Take 1 tablet by mouth 2 (two) times daily. 180 tablet 3   sildenafil  (VIAGRA ) 50 MG tablet Take 1 tablet (50 mg total) by mouth daily as needed for erectile dysfunction. 20 tablet 3   sitaGLIPtin -metformin  (JANUMET ) 50-500 MG tablet Take 1 tablet by mouth daily. 90 tablet 2   Vitamin D , Ergocalciferol , (DRISDOL ) 1.25 MG (50000 UT) CAPS capsule TAKE ONE CAPSULE BY MOUTH ONCE WEEKLY (Patient taking differently: Take 50,000 Units by mouth every 7 (seven) days. On Wednesdays) 12 capsule 0   fluticasone -salmeterol (ADVAIR ) 250-50 MCG/ACT AEPB INHALE 1 DOSE BY MOUTH IN THE MORNING AND AT BEDTIME 60 each 0   Ipratropium-Albuterol  (COMBIVENT  RESPIMAT) 20-100 MCG/ACT AERS respimat INHALE 1 PUFF BY MOUTH FOUR TIMES DAILY 12 g 3   ipratropium-albuterol  (DUONEB) 0.5-2.5 (3) MG/3ML SOLN Inhale 1 vial via nebulizer every 6 hours as needed 1080 mL 3   VENTOLIN  HFA 108 (90 Base) MCG/ACT inhaler INHALE 1 TO 2 PUFFS BY MOUTH EVERY 6 HOURS AS NEEDED FOR WHEEZING OR SHORTNESS OF BREATH 18 g 0   gabapentin  (NEURONTIN ) 300 MG capsule TAKE 1 CAPSULE BY MOUTH 3 TIMES DAILY (Patient not taking: Reported on 07/02/2023) 90 capsule 0   No facility-administered medications prior to visit.     Review of Systems:   Constitutional:   No  weight loss, night sweats,  Fevers, chills,  fatigue, or  lassitude.  HEENT:   No headaches,  Difficulty swallowing,  Tooth/dental problems, or  Sore throat,                No sneezing, itching, ear ache, nasal congestion, post nasal drip,   CV:  No chest pain,  Orthopnea, PND, swelling in lower extremities, anasarca, dizziness, palpitations, syncope.   GI  No heartburn, indigestion, abdominal pain, nausea, vomiting, diarrhea, change in bowel habits, loss of appetite, bloody stools.   Resp:  No chest wall deformity  Skin: no rash or lesions.  GU: no dysuria, change in color of urine, no urgency or frequency.  No flank pain, no hematuria   MS:  No joint pain or swelling.  No decreased range of motion.  No back pain.    Physical Exam  BP 106/64 (BP Location: Left Arm, Patient Position: Sitting, Cuff  Size: Normal)   Pulse 71   Temp 98.4 F (36.9 C) (Oral)   Ht 5\' 8"  (1.727 m)   Wt 165 lb 3.2 oz (74.9 kg)   SpO2 93%   BMI 25.12 kg/m   GEN: A/Ox3; pleasant , NAD, well nourished    HEENT:  [place]/AT,  EACs-clear, TMs-wnl, NOSE-clear, THROAT-clear, no lesions, no postnasal drip or exudate noted.   NECK:  Supple w/ fair ROM; no JVD; normal carotid impulses w/o bruits; no thyromegaly or nodules palpated; no lymphadenopathy.    RESP  Clear  P & A; w/o, wheezes/ rales/ or rhonchi. no accessory muscle use, no dullness to percussion  CARD:  RRR, no m/r/g, no peripheral edema, pulses intact, no cyanosis or clubbing.  GI:   Soft & nt; nml bowel sounds; no organomegaly or masses detected.   Musco: Warm bil, no deformities or joint swelling noted.   Neuro: alert, no focal deficits noted.    Skin: Warm, no lesions or rashes    Lab Results:  CBC    Component Value Date/Time   WBC 4.8 01/25/2023 0414   RBC 4.22 01/25/2023 0414   HGB 11.9 (L) 01/25/2023 0414   HGB 17.9 (H) 09/19/2017 1133   HCT 37.5 (L) 01/25/2023 0414   HCT 53.2 (H) 09/19/2017 1133   PLT 192 01/25/2023 0414   PLT 288 09/19/2017 1133   MCV 88.9 01/25/2023  0414   MCV 87 09/19/2017 1133   MCH 28.2 01/25/2023 0414   MCHC 31.7 01/25/2023 0414   RDW 14.1 01/25/2023 0414   RDW 14.2 09/19/2017 1133   LYMPHSABS 0.9 01/23/2023 0352   MONOABS 0.9 01/23/2023 0352   EOSABS 0.0 01/23/2023 0352   BASOSABS 0.0 01/23/2023 0352    BMET    Component Value Date/Time   NA 139 01/25/2023 0414   NA 142 03/27/2022 1025   K 3.5 01/25/2023 0414   CL 106 01/25/2023 0414   CO2 27 01/25/2023 0414   GLUCOSE 110 (H) 01/25/2023 0414   BUN 14 01/25/2023 0414   BUN 9 03/27/2022 1025   CREATININE 0.80 01/25/2023 0414   CALCIUM  8.6 (L) 01/25/2023 0414   GFRNONAA >60 01/25/2023 0414   GFRAA >60 06/30/2019 0605    BNP    Component Value Date/Time   BNP 130.3 (H) 01/23/2023 0352    ProBNP    Component Value Date/Time   PROBNP 45.2 01/19/2013 1654    Imaging: No results found.  Administration History     None          Latest Ref Rng & Units 11/17/2016    9:56 AM  PFT Results  FVC-Pre L 2.87   FVC-Predicted Pre % 74   FVC-Post L 3.06   FVC-Predicted Post % 78   Pre FEV1/FVC % % 43   Post FEV1/FCV % % 45   FEV1-Pre L 1.24   FEV1-Predicted Pre % 40   FEV1-Post L 1.39   DLCO uncorrected ml/min/mmHg 18.70   DLCO UNC% % 61   DLCO corrected ml/min/mmHg 17.90   DLCO COR %Predicted % 59   DLVA Predicted % 69     No results found for: "NITRICOXIDE"      Assessment & Plan:   COPD (chronic obstructive pulmonary disease) Appears stable -continue on current regimen  Refills sent   Plan  Patient Instructions  Advair  250 1 puff Twice daily, rinse after use.  Continue on Combivent  1 puff 4 times daily Continue on Oxygen  3l/m with activity.  Albuterol  inhaler  As needed   Activity as tolerated.  Continue with yearly CT chest screening  Saline nasal spray As needed   Saline nasal gel At bedtime   Follow up with Dr. Baldwin Levee  in 3-4  months and As needed  Please contact office for sooner follow up if symptoms do not improve or worsen or  seek emergency care        Chronic respiratory failure with hypoxia (HCC) Continue on Oxygen  to keep O2 sats >88-90%  Plan  Patient Instructions  Advair  250 1 puff Twice daily, rinse after use.  Continue on Combivent  1 puff 4 times daily Continue on Oxygen  3l/m with activity.  Albuterol  inhaler As needed   Activity as tolerated.  Continue with yearly CT chest screening  Saline nasal spray As needed   Saline nasal gel At bedtime   Follow up with Dr. Baldwin Levee  in 3-4  months and As needed  Please contact office for sooner follow up if symptoms do not improve or worsen or seek emergency care       Tobacco use disorder Continue with smoking cessation.  Continue with yearly CT chest screening program.     Roena Steel, NP 07/02/2023

## 2023-07-02 NOTE — Patient Instructions (Addendum)
 Advair  250 1 puff Twice daily, rinse after use.  Continue on Combivent  1 puff 4 times daily Continue on Oxygen  3l/m with activity.  Albuterol  inhaler As needed   Activity as tolerated.  Continue with yearly CT chest screening  Saline nasal spray As needed   Saline nasal gel At bedtime   Follow up with Dr. Baldwin Levee  in 3-4  months and As needed  Please contact office for sooner follow up if symptoms do not improve or worsen or seek emergency care

## 2023-08-19 ENCOUNTER — Other Ambulatory Visit: Payer: Self-pay | Admitting: Cardiology

## 2023-10-10 NOTE — Telephone Encounter (Signed)
 Copied from CRM #[id number]. Topic: Clinical Concerns - Diagnostic Test Clarificaton/Change >> Oct 10, 2023  1:39 PM Patryce P wrote: [facility] - Ultrasound Department of [place] is calling for clinical concerns (Ask: What symptoms are you calling about today, AND how long have you had these symptoms? Must Review HPKW list for symptoms) Document Name of Triage Nurse/BH Rep taking the call when applicable)   Include all details related to the request(s) below: [facility] is calling from the Ultrasound department for the patient's ultrasound order. They are wanting to know about a prior authorization if it needs one. They have openings available for tomorrow and would like to schedule the patient.  Please advise    Confirm and type the Best Contact Number below:  Patient/caller contact number:  [phone]           [] Home  [] Mobile  [x] Work [] Other   [x] Okay to leave a voicemail   Medication List:  Current Outpatient Medications:  .  albuterol  HFA (Ventolin  HFA) 90 mcg/actuation inhaler, INHALE 1 TO 2 PUFFS BY MOUTH EVERY 6 HOURS AS NEEDED FOR WHEEZING OR SHORTNESS OF BREATH, Disp: , Rfl:  .  aspirin  81 mg EC tablet, Take 81 mg by mouth daily., Disp: , Rfl:  .  baclofen  (LIORESAL ) 10 mg tablet, Take 1 tablet by mouth 2 (two) times a day., Disp: , Rfl:  .  bisoprolol  (ZEBETA ) 5 mg tablet, Take 5 mg by mouth daily., Disp: , Rfl:  .  [START ON 10/11/2023] cholecalciferol (VITAMIN D3) 1,250 mcg (50,000 unit) capsule, Take 1 each (50,000 Units total) by mouth 2 (two) times weekly for 90 days, THEN 1 each (50,000 Units total) once a week., Disp: 63 each, Rfl: 0 .  Entresto  97-103 mg per tablet, Take 1 tablet by mouth 2 (two) times a day., Disp: , Rfl:  .  fluticasone  propion-salmeteroL (Advair  Diskus) 250-50 mcg/dose diskus inhaler, Inhale 1 puff  in the morning and 1 puff before bedtime., Disp: , Rfl:  .  fluticasone  propionate (FLONASE ) 50 mcg/spray nasal spray, INHALE 2 SPRAYS INTO BOTH NOSTRILS  TWICE DAILY**NEED TO MAKE AN APPOINTMENT FOR REFILLS**, Disp: , Rfl:  .  furosemide  (LASIX ) 20 mg tablet, Take 10 mg by mouth every other day., Disp: , Rfl:  .  ipratropium-albuteroL  (Combivent  Respimat) 20-100 mcg/actuation inhaler, INHALE 1 PUFF BY MOUTH INTO THE LUNGS FOUR TIMES A DAY, Disp: , Rfl:  .  ipratropium-albuteroL  (DUO-NEB) 0.5-2.5 mg/3 mL nebulizer solution, INHALE 1 VIAL VIA NEBULIZER EVERY 6 HOURS AS DIRECTED FOR SHORTNESS OF BREATH OR WHEEZING., Disp: , Rfl:  .  nystatin  (MYCOSTATIN ) 100,000 unit/mL suspension, TAKE 5 ML BY MOUTH 4 TIMES DAILY, Disp: , Rfl:  .  omeprazole  (PriLOSEC) 40 mg DR capsule, Take 1 capsule (40 mg total) by mouth daily., Disp: 90 capsule, Rfl: 3 .  oxyCODONE -acetaminophen  (PERCOCET) 10-325 mg per tablet, Take 1 tablet by mouth 2 (two) times a day as needed for moderate pain (4-6)., Disp: 10 tablet, Rfl: 0 .  oxygen  (O2) gas, Inhale 2 L/min continuous., Disp: , Rfl:  .  pregabalin  (LYRICA ) 50 mg capsule, Take 1 capsule (50 mg total) by mouth 3 (three) times a day., Disp: 270 capsule, Rfl: 1 .  rosuvastatin  (CRESTOR ) 10 mg tablet, Take 1 tablet (10 mg total) by mouth daily., Disp: 90 tablet, Rfl: 3 .  sildenafiL  (VIAGRA ) 50 mg tablet, Take 50 mg by mouth daily as needed for erectile dysfunction., Disp: , Rfl:  .  venlafaxine  (EFFEXOR  XR) 75 mg 24  hr capsule, Take 1 capsule by mouth daily., Disp: , Rfl:      Medication Request/Refills: Pharmacy Information (if applicable)   [] Not Applicable       []  Pharmacy listed  Send Medication Request to:                                                 [] Pharmacy not listed (added to pharmacy list in Epic) Send Medication Request to:      Listed Pharmacies: [facility] [address] [place], [place] - [address] - PHONE: [phone] - FAX: [phone]

## 2023-10-11 ENCOUNTER — Ambulatory Visit (HOSPITAL_COMMUNITY)
Admission: RE | Admit: 2023-10-11 | Discharge: 2023-10-11 | Disposition: A | Discharge status: Home / Self Care | Source: Ambulatory Visit | Attending: Vascular Surgery | Admitting: Vascular Surgery

## 2023-10-11 ENCOUNTER — Other Ambulatory Visit (HOSPITAL_COMMUNITY): Payer: Self-pay | Admitting: Physician Assistant

## 2023-10-11 DIAGNOSIS — M542 Cervicalgia: Secondary | ICD-10-CM

## 2023-11-23 ENCOUNTER — Other Ambulatory Visit: Payer: Self-pay | Admitting: Adult Health

## 2023-11-23 DIAGNOSIS — J449 Chronic obstructive pulmonary disease, unspecified: Secondary | ICD-10-CM

## 2024-01-02 ENCOUNTER — Other Ambulatory Visit: Payer: Self-pay | Admitting: Cardiology

## 2024-01-04 ENCOUNTER — Telehealth: Payer: Self-pay | Admitting: Cardiology

## 2024-01-04 MED ORDER — FUROSEMIDE 20 MG PO TABS: 20.0000 mg | ORAL_TABLET | ORAL | 1 refills | Status: DC

## 2024-01-04 MED ORDER — FUROSEMIDE 20 MG PO TABS: 10.0000 mg | ORAL_TABLET | ORAL | 1 refills | Status: DC

## 2024-01-04 NOTE — Addendum Note (Signed)
 Addended by: JOSHUA ANDREZ PARAS on: 01/04/2024 11:49 AM   Modules accepted: Orders

## 2024-01-04 NOTE — Telephone Encounter (Signed)
 Lasix  prescription updated and resent to pharmacy.   Per last visit:  He tolerates the meds as listed although he had a question about his Lasix  as the pharmacy keeps giving him 20 mg tablets and I think he could take as little as 10 mg every other day and an extra 10 as needed.

## 2024-01-04 NOTE — Telephone Encounter (Signed)
 Pt c/o medication issue:  1. Name of Medication:   furosemide  (LASIX ) 20 MG tablet   2. How are you currently taking this medication (dosage and times per day)?   3. Are you having a reaction (difficulty breathing--STAT)?   4. What is your medication issue?   Caller Phillis) stated they need clarification on the instructions for this medication.  Caller wants new prescription sent with clear instructions.

## 2024-01-07 ENCOUNTER — Other Ambulatory Visit: Payer: Self-pay | Admitting: Cardiology

## 2024-01-07 DIAGNOSIS — I428 Other cardiomyopathies: Secondary | ICD-10-CM

## 2024-01-31 ENCOUNTER — Ambulatory Visit: Admitting: Cardiology

## 2024-02-09 NOTE — Progress Notes (Unsigned)
" °  Cardiology Office Note:   Date:  02/11/2024  ID:  Scott [name], DOB 1960-06-12, MRN [id number] PCP: Emerick Avelina [name]  [name] Cardiologist:  Lynwood Schilling, MD {  History of Present Illness:   Scott [name] is a 64 y.o. male  who presents for chronic systolic heart failure, nonischemic cardiomyopathy He has no angiographic evidence of CAD.with right heart cath revealing volume overload.  EF was 30% to 35%.   He was hospitalized in early June 2021 due to respiratory failure, COPD exacerbation, and hypotension.  At that time Entresto , Lasix , and bisoprolol  were discontinued.  We have been titrating these meds back up at recent visits.  He uses O2 PRN.  His last ejection fraction in 2022 was 60 to 65%.    Since he was last seen he has had no new cardiovascular problems.  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  He denies any symptoms with this. The patient denies any new symptoms such as chest discomfort, neck or arm discomfort. There has been no new shortness of breath, PND or orthopnea. There have been no reported palpitations, presyncope or syncope.  He does still use as needed oxygen  once in a while.  ROS: As stated in the HPI and negative for all other systems.  Studies Reviewed:    EKG:   EKG Interpretation Date/Time:  Monday February 11 2024 09:55:50 EST Ventricular Rate:  68 PR Interval:  232 QRS Duration:  94 QT Interval:  420 QTC Calculation: 446 R Axis:   -5  Text Interpretation: Sinus rhythm with 1st degree A-V block Possible anteroseptal infarct age undetermined When compared with ECG of 23-Jan-2023 03:35, No significant change since last tracing Confirmed by Schilling Rattan ([id number]) on 02/11/2024 10:02:47 AM     Risk Assessment/Calculations:              Physical Exam:   VS:  BP 114/69 (BP Location: Left Arm, Patient Position: Sitting)   Pulse 68   Ht 5' 8 (1.727 m)   Wt 169 lb 6.4 oz (76.8 kg)   SpO2 91%   BMI 25.76  kg/m    Wt Readings from Last 3 Encounters:  02/11/24 169 lb 6.4 oz (76.8 kg)  07/02/23 165 lb 3.2 oz (74.9 kg)  03/28/23 176 lb 3.2 oz (79.9 kg)     GEN: Well nourished, well developed in no acute distress NECK: No JVD; No carotid bruits CARDIAC: RRR, no murmurs, rubs, gallops RESPIRATORY:  Clear to auscultation without rales, wheezing or rhonchi  ABDOMEN: Soft, non-tender, non-distended EXTREMITIES:  No edema; No deformity   ASSESSMENT AND PLAN:   Nonischemic cardiomyopathy: His ejection fraction was normal in April 2022.  He has no new symptoms.  He is on stable medical regimen.  No change in therapy.  I will follow this clinically.  He is going to have lab work with his primary provider soon and I asked him to make sure he gets a forensic scientist.   Hypertension:    the BP is at target.  No change in therapy.   COPD:    He uses as needed O2 and has stable lung disease.  No change in therapy.   ED:   He complains of this and I am going to manage him with as needed Viagra  and gave him a prescription.  He has no contraindication.      Follow up with me in one year.   Signed, Lynwood Schilling, MD   "

## 2024-02-11 ENCOUNTER — Encounter: Payer: Self-pay | Admitting: Cardiology

## 2024-02-11 ENCOUNTER — Ambulatory Visit: Attending: Cardiology | Admitting: Cardiology

## 2024-02-11 VITALS — BP 114/69 | HR 68 | Ht 68.0 in | Wt 169.4 lb

## 2024-02-11 DIAGNOSIS — I428 Other cardiomyopathies: Secondary | ICD-10-CM

## 2024-02-11 DIAGNOSIS — I10 Essential (primary) hypertension: Secondary | ICD-10-CM

## 2024-02-11 MED ORDER — ROSUVASTATIN CALCIUM 10 MG PO TABS: 10.0000 mg | ORAL_TABLET | Freq: Every day | ORAL | 3 refills | Status: AC

## 2024-02-11 MED ORDER — SILDENAFIL CITRATE 50 MG PO TABS: 50.0000 mg | ORAL_TABLET | Freq: Every day | ORAL | 3 refills | Status: AC | PRN

## 2024-02-11 MED ORDER — BISOPROLOL FUMARATE 5 MG PO TABS: 5.0000 mg | ORAL_TABLET | Freq: Every day | ORAL | 1 refills | Status: AC

## 2024-02-11 MED ORDER — SACUBITRIL-VALSARTAN 97-103 MG PO TABS: 1.0000 | ORAL_TABLET | Freq: Two times a day (BID) | ORAL | 3 refills | Status: AC

## 2024-02-11 NOTE — Patient Instructions (Signed)

## 2024-02-21 ENCOUNTER — Ambulatory Visit: Admitting: Emergency Medicine

## 2024-02-21 ENCOUNTER — Encounter: Payer: Self-pay | Admitting: Emergency Medicine

## 2024-02-21 ENCOUNTER — Other Ambulatory Visit: Payer: Self-pay | Admitting: Cardiology

## 2024-02-21 VITALS — BP 110/54 | HR 74 | Temp 98.2°F | Ht 68.0 in | Wt 168.1 lb

## 2024-02-21 DIAGNOSIS — F17200 Nicotine dependence, unspecified, uncomplicated: Secondary | ICD-10-CM

## 2024-02-21 DIAGNOSIS — F17210 Nicotine dependence, cigarettes, uncomplicated: Secondary | ICD-10-CM | POA: Diagnosis not present

## 2024-02-21 DIAGNOSIS — J449 Chronic obstructive pulmonary disease, unspecified: Secondary | ICD-10-CM

## 2024-02-21 DIAGNOSIS — J441 Chronic obstructive pulmonary disease with (acute) exacerbation: Secondary | ICD-10-CM

## 2024-02-21 DIAGNOSIS — J9611 Chronic respiratory failure with hypoxia: Secondary | ICD-10-CM

## 2024-02-21 MED ORDER — COMBIVENT RESPIMAT 20-100 MCG/ACT IN AERS: INHALATION_SPRAY | RESPIRATORY_TRACT | 11 refills | Status: AC

## 2024-02-21 MED ORDER — IPRATROPIUM-ALBUTEROL 0.5-2.5 (3) MG/3ML IN SOLN: RESPIRATORY_TRACT | 5 refills | Status: AC

## 2024-02-21 MED ORDER — VENTOLIN HFA 108 (90 BASE) MCG/ACT IN AERS: INHALATION_SPRAY | RESPIRATORY_TRACT | 6 refills | Status: AC

## 2024-02-21 NOTE — Progress Notes (Signed)
 " Subjective:    Patient ID: Quintin Gaskins, male    DOB: Jan 01, 1961, 64 y.o.   MRN: [id number]  HPI  ROV 03/28/2023 --follow-up visit 64 year old man with history of severe COPD and associated chronic hypoxemic respiratory failure.  He has chronic cough and mucus production.  Also with a history of chronic pain that impacts his back, legs.  PMH: Nonischemic cardiomyopathy/CHF, diabetes He describes some pain on inspiration as well.  He was in the hospital at the end of December, treated for possible community-acquired pneumonia/COPD exacerbation.  He is scheduled for his lung cancer screening CT chest later this month. Currently managed on Advair , DuoNeb or combivent  prn. He is not using the Wixela reliably because he has had trouble w thrush.  Today he reports that he is better, still quite limited w exertion. He is wearing his O2 reliably. No cough. He has pain in his R mid back. He is smoking about 3-4 cig a day. He is interested in stopping smoking, wellbutrin .   ROV 02/21/2024 --follow-up visit for 64 year old gentleman with a history of severe COPD, continued tobacco use (just restarted), associated chronic hypoxemic respiratory failure.  He also has a history of a nonischemic cardiomyopathy and CHF, diabetes, chronic back pain.  Last seen in our office in June.  He has been managed on Wixela but averages once a day instead of 2, Combivent  4 times daily, oxygen  3 L/min. He has been working, has been exerting without oxygen . He has duoneb at home, but has not used frequently. He is smoking about 3 cig a day. He has a POC, is using it less - doesn't see any desats. He feels that he is doing better. Minimal cough or wheeze.        07/02/2023    2:12 PM  CAT Score  Total CAT Score 7    Objective:   Physical Exam  Vitals:   02/21/24 1318  BP: (!) 110/54  Pulse: 74  Temp: 98.2 F (36.8 C)  SpO2: 93%  Weight: 168 lb 2 oz (76.3 kg)  Height: 5' 8 (1.727 m)    Gen: Pleasant, well-nourished,  in no distress  ENT: No lesions,  mouth clear,  oropharynx clear, no postnasal drip,   Neck: No JVD, no stridor  Lungs: mostly clear, overall clear, few end expiratory wheezes on forced expiration  Cardiovascular: RRR, heart sounds normal, no murmur or gallops, trace peripheral edema  Musculoskeletal: No deformities, no cyanosis or clubbing  Neuro: alert, appropriate, non-focal  Skin: Warm     Assessment & Plan:  COPD (chronic obstructive pulmonary disease) (HCC) Overall doing well although he has restarted smoking.  Has been more active, has been working.  I think this has been good for him.  Minimal wheeze or cough.  He does have some exertional dyspnea.  He has not been using the Wixela reliably, averages once daily.  I think we can try stopping it to see if he tolerates.  May be able to manage him with scheduled Combivent  alone and albuterol  as needed.  If he backtracks then we can try getting him back on ICS/LABA plus-minus LAMA.  Did discuss smoking cessation with him.  He did not get the flu shot this year, we will revisit next year as he may be willing to do so.  Chronic respiratory failure with hypoxia (HCC) Using his oxygen  less with exertion and has not seen any desaturations.  We may be able to repeat his walking oximetry at some point going  forward.  If he has improved he may not continue to require it.  Tobacco use disorder Discussed smoking cessation with him.  He is going to work on continue to cut down and then set a quit date.  He knows to get his lung cancer screening CT scan of the chest in March as planned.   I personally spent a total of 23 minutes in the care of the patient today including preparing to see the patient, getting/reviewing separately obtained history, performing a medically appropriate exam/evaluation, counseling and educating, placing orders, documenting clinical information in the EHR, independently interpreting results, and communicating results.      Lamar Chris, MD, PhD 02/21/2024, 1:51 PM [facility] [phone] or if no answer [phone] "

## 2024-02-21 NOTE — Patient Instructions (Signed)
 I am glad that you have been doing well. Try to work on decreasing your cigarettes.  Our goal would be for you to be able to stop altogether. We will stop your Advair  [place]) for now. Continue to use your Combivent  2 puffs 4 times a day on a schedule. You can use your DuoNeb as a substitute for Combivent  when available. Keep your albuterol  available to use 2 puffs if you need for shortness of breath, chest tightness, wheezing. Would recommend that you get the flu shot next fall Get your lung cancer screening CT scan of the chest in March as planned. Follow in our office in 6 months.  Message us  sooner if you have any problems.

## 2024-02-21 NOTE — Assessment & Plan Note (Signed)
 Using his oxygen  less with exertion and has not seen any desaturations.  We may be able to repeat his walking oximetry at some point going forward.  If he has improved he may not continue to require it.

## 2024-02-21 NOTE — Assessment & Plan Note (Signed)
 Discussed smoking cessation with him.  He is going to work on continue to cut down and then set a quit date.  He knows to get his lung cancer screening CT scan of the chest in March as planned.

## 2024-02-21 NOTE — Assessment & Plan Note (Signed)
 Overall doing well although he has restarted smoking.  Has been more active, has been working.  I think this has been good for him.  Minimal wheeze or cough.  He does have some exertional dyspnea.  He has not been using the Wixela reliably, averages once daily.  I think we can try stopping it to see if he tolerates.  May be able to manage him with scheduled Combivent  alone and albuterol  as needed.  If he backtracks then we can try getting him back on ICS/LABA plus-minus LAMA.  Did discuss smoking cessation with him.  He did not get the flu shot this year, we will revisit next year as he may be willing to do so.

## 2024-02-25 MED ORDER — FUROSEMIDE 20 MG PO TABS: 10.0000 mg | ORAL_TABLET | ORAL | 3 refills | Status: AC

## 2024-04-15 ENCOUNTER — Other Ambulatory Visit
